# Patient Record
Sex: Female | Born: 1955 | Race: Black or African American | Hispanic: No | Marital: Single | Smoking: Never smoker
Health system: Southern US, Community
[De-identification: ages and names within clinical notes are randomized; demographics above are authoritative.]

## PROBLEM LIST (undated history)

## (undated) DIAGNOSIS — J069 Acute upper respiratory infection, unspecified: Secondary | ICD-10-CM

## (undated) DIAGNOSIS — R0600 Dyspnea, unspecified: Secondary | ICD-10-CM

## (undated) DIAGNOSIS — H9190 Unspecified hearing loss, unspecified ear: Secondary | ICD-10-CM

## (undated) DIAGNOSIS — R0789 Other chest pain: Secondary | ICD-10-CM

## (undated) DIAGNOSIS — E785 Hyperlipidemia, unspecified: Secondary | ICD-10-CM

## (undated) DIAGNOSIS — N731 Chronic parametritis and pelvic cellulitis: Secondary | ICD-10-CM

## (undated) DIAGNOSIS — Q210 Ventricular septal defect: Secondary | ICD-10-CM

## (undated) DIAGNOSIS — I10 Essential (primary) hypertension: Secondary | ICD-10-CM

## (undated) DIAGNOSIS — Z9289 Personal history of other medical treatment: Secondary | ICD-10-CM

## (undated) DIAGNOSIS — R011 Cardiac murmur, unspecified: Secondary | ICD-10-CM

## (undated) DIAGNOSIS — E669 Obesity, unspecified: Secondary | ICD-10-CM

## (undated) DIAGNOSIS — R4701 Aphasia: Secondary | ICD-10-CM

## (undated) DIAGNOSIS — R42 Dizziness and giddiness: Secondary | ICD-10-CM

## (undated) DIAGNOSIS — M549 Dorsalgia, unspecified: Secondary | ICD-10-CM

## (undated) DIAGNOSIS — K56609 Unspecified intestinal obstruction, unspecified as to partial versus complete obstruction: Secondary | ICD-10-CM

## (undated) HISTORY — DX: Ventricular septal defect: Q21.0

## (undated) HISTORY — DX: Chronic parametritis and pelvic cellulitis: N73.1

## (undated) HISTORY — DX: Essential (primary) hypertension: I10

## (undated) HISTORY — DX: Acute upper respiratory infection, unspecified: J06.9

## (undated) HISTORY — DX: Obesity, unspecified: E66.9

## (undated) HISTORY — DX: Hyperlipidemia, unspecified: E78.5

## (undated) HISTORY — DX: Dorsalgia, unspecified: M54.9

## (undated) HISTORY — PX: COLPOSCOPY: SHX161

## (undated) HISTORY — DX: Personal history of other medical treatment: Z92.89

## (undated) HISTORY — DX: Cardiac murmur, unspecified: R01.1

## (undated) HISTORY — DX: Dyspnea, unspecified: R06.00

## (undated) HISTORY — DX: Other chest pain: R07.89

## (undated) HISTORY — DX: Dizziness and giddiness: R42

## (undated) HISTORY — PX: CHOLECYSTECTOMY: SHX55

## (undated) HISTORY — PX: ABDOMINAL HYSTERECTOMY: SHX81

## (undated) HISTORY — PX: OTHER SURGICAL HISTORY: SHX169

## (undated) HISTORY — PX: ABDOMINAL HERNIA REPAIR: SHX539

---

## 1978-10-05 HISTORY — PX: TUBAL LIGATION: SHX77

## 1993-11-04 HISTORY — PX: CHOLECYSTECTOMY: SHX55

## 1995-08-23 HISTORY — PX: CARDIAC CATHETERIZATION: SHX172

## 1997-02-04 ENCOUNTER — Inpatient Hospital Stay (HOSPITAL_COMMUNITY): Admission: AD | Admit: 1997-02-04 | Discharge: 1997-02-12 | Payer: Self-pay | Admitting: Obstetrics

## 1997-06-25 ENCOUNTER — Inpatient Hospital Stay (HOSPITAL_COMMUNITY): Admission: AD | Admit: 1997-06-25 | Discharge: 1997-06-25 | Payer: Self-pay | Admitting: *Deleted

## 1997-08-02 ENCOUNTER — Emergency Department (HOSPITAL_COMMUNITY): Admission: EM | Admit: 1997-08-02 | Discharge: 1997-08-02 | Payer: Self-pay | Admitting: Emergency Medicine

## 1997-09-03 ENCOUNTER — Encounter: Admission: RE | Admit: 1997-09-03 | Discharge: 1997-09-03 | Payer: Self-pay | Admitting: Family Medicine

## 1997-09-18 ENCOUNTER — Inpatient Hospital Stay (HOSPITAL_COMMUNITY): Admission: AD | Admit: 1997-09-18 | Discharge: 1997-09-22 | Payer: Self-pay | Admitting: Obstetrics

## 1997-10-03 ENCOUNTER — Encounter: Admission: RE | Admit: 1997-10-03 | Discharge: 1997-10-03 | Payer: Self-pay | Admitting: Obstetrics

## 1997-10-22 ENCOUNTER — Ambulatory Visit (HOSPITAL_COMMUNITY): Admission: RE | Admit: 1997-10-22 | Discharge: 1997-10-22 | Payer: Self-pay | Admitting: Obstetrics

## 1997-11-05 ENCOUNTER — Encounter: Admission: RE | Admit: 1997-11-05 | Discharge: 1997-11-05 | Payer: Self-pay | Admitting: Family Medicine

## 1997-11-17 ENCOUNTER — Encounter: Payer: Self-pay | Admitting: Emergency Medicine

## 1997-11-17 ENCOUNTER — Emergency Department (HOSPITAL_COMMUNITY): Admission: EM | Admit: 1997-11-17 | Discharge: 1997-11-17 | Payer: Self-pay

## 1997-12-03 ENCOUNTER — Other Ambulatory Visit: Admission: RE | Admit: 1997-12-03 | Discharge: 1997-12-03 | Payer: Self-pay | Admitting: Family Medicine

## 1997-12-03 ENCOUNTER — Encounter: Admission: RE | Admit: 1997-12-03 | Discharge: 1997-12-03 | Payer: Self-pay | Admitting: Family Medicine

## 1997-12-10 ENCOUNTER — Encounter: Admission: RE | Admit: 1997-12-10 | Discharge: 1997-12-10 | Payer: Self-pay | Admitting: Family Medicine

## 1998-02-27 ENCOUNTER — Encounter: Admission: RE | Admit: 1998-02-27 | Discharge: 1998-02-27 | Payer: Self-pay | Admitting: Obstetrics

## 1998-02-27 ENCOUNTER — Inpatient Hospital Stay (HOSPITAL_COMMUNITY): Admission: AD | Admit: 1998-02-27 | Discharge: 1998-03-03 | Payer: Self-pay | Admitting: Obstetrics

## 1998-02-28 ENCOUNTER — Encounter: Payer: Self-pay | Admitting: Obstetrics

## 1998-03-20 ENCOUNTER — Encounter: Admission: RE | Admit: 1998-03-20 | Discharge: 1998-03-20 | Payer: Self-pay | Admitting: Obstetrics

## 1998-04-17 ENCOUNTER — Encounter: Admission: RE | Admit: 1998-04-17 | Discharge: 1998-04-17 | Payer: Self-pay | Admitting: Obstetrics

## 1998-05-02 ENCOUNTER — Emergency Department (HOSPITAL_COMMUNITY): Admission: EM | Admit: 1998-05-02 | Discharge: 1998-05-02 | Payer: Self-pay | Admitting: Emergency Medicine

## 1998-05-02 ENCOUNTER — Encounter: Payer: Self-pay | Admitting: Emergency Medicine

## 1998-05-05 ENCOUNTER — Encounter: Payer: Self-pay | Admitting: Obstetrics

## 1998-05-05 ENCOUNTER — Ambulatory Visit (HOSPITAL_COMMUNITY): Admission: RE | Admit: 1998-05-05 | Discharge: 1998-05-05 | Payer: Self-pay | Admitting: Obstetrics

## 1998-05-15 ENCOUNTER — Encounter: Admission: RE | Admit: 1998-05-15 | Discharge: 1998-05-15 | Payer: Self-pay | Admitting: Obstetrics

## 1998-07-03 ENCOUNTER — Inpatient Hospital Stay (HOSPITAL_COMMUNITY): Admission: AD | Admit: 1998-07-03 | Discharge: 1998-07-03 | Payer: Self-pay | Admitting: Obstetrics

## 1998-10-05 ENCOUNTER — Encounter (INDEPENDENT_AMBULATORY_CARE_PROVIDER_SITE_OTHER): Payer: Self-pay | Admitting: *Deleted

## 1998-10-14 ENCOUNTER — Other Ambulatory Visit: Admission: RE | Admit: 1998-10-14 | Discharge: 1998-10-14 | Payer: Self-pay | Admitting: Family Medicine

## 1998-11-21 ENCOUNTER — Encounter: Payer: Self-pay | Admitting: Surgery

## 1998-11-21 ENCOUNTER — Encounter: Admission: RE | Admit: 1998-11-21 | Discharge: 1998-11-21 | Payer: Self-pay | Admitting: Surgery

## 1998-12-05 ENCOUNTER — Emergency Department (HOSPITAL_COMMUNITY): Admission: EM | Admit: 1998-12-05 | Discharge: 1998-12-05 | Payer: Self-pay | Admitting: Emergency Medicine

## 1998-12-05 ENCOUNTER — Encounter: Payer: Self-pay | Admitting: Emergency Medicine

## 1998-12-12 ENCOUNTER — Emergency Department (HOSPITAL_COMMUNITY): Admission: EM | Admit: 1998-12-12 | Discharge: 1998-12-12 | Payer: Self-pay | Admitting: Emergency Medicine

## 1998-12-31 ENCOUNTER — Emergency Department (HOSPITAL_COMMUNITY): Admission: EM | Admit: 1998-12-31 | Discharge: 1998-12-31 | Payer: Self-pay | Admitting: Emergency Medicine

## 1998-12-31 ENCOUNTER — Encounter: Payer: Self-pay | Admitting: Emergency Medicine

## 1999-01-13 ENCOUNTER — Encounter: Admission: RE | Admit: 1999-01-13 | Discharge: 1999-01-13 | Payer: Self-pay | Admitting: Family Medicine

## 1999-02-10 ENCOUNTER — Encounter: Admission: RE | Admit: 1999-02-10 | Discharge: 1999-02-10 | Payer: Self-pay | Admitting: Family Medicine

## 1999-02-24 ENCOUNTER — Encounter: Admission: RE | Admit: 1999-02-24 | Discharge: 1999-02-24 | Payer: Self-pay | Admitting: Family Medicine

## 1999-03-03 ENCOUNTER — Encounter: Admission: RE | Admit: 1999-03-03 | Discharge: 1999-03-03 | Payer: Self-pay | Admitting: Family Medicine

## 1999-03-12 ENCOUNTER — Encounter: Payer: Self-pay | Admitting: Sports Medicine

## 1999-03-12 ENCOUNTER — Encounter: Admission: RE | Admit: 1999-03-12 | Discharge: 1999-03-12 | Payer: Self-pay | Admitting: Sports Medicine

## 1999-03-17 ENCOUNTER — Encounter: Admission: RE | Admit: 1999-03-17 | Discharge: 1999-03-17 | Payer: Self-pay | Admitting: Family Medicine

## 1999-06-04 ENCOUNTER — Encounter: Admission: RE | Admit: 1999-06-04 | Discharge: 1999-06-04 | Payer: Self-pay | Admitting: Obstetrics

## 1999-06-24 ENCOUNTER — Encounter: Payer: Self-pay | Admitting: Obstetrics

## 1999-06-24 ENCOUNTER — Ambulatory Visit (HOSPITAL_COMMUNITY): Admission: RE | Admit: 1999-06-24 | Discharge: 1999-06-24 | Payer: Self-pay

## 1999-10-01 ENCOUNTER — Encounter: Admission: RE | Admit: 1999-10-01 | Discharge: 1999-10-01 | Payer: Self-pay | Admitting: Obstetrics

## 1999-10-13 ENCOUNTER — Emergency Department (HOSPITAL_COMMUNITY): Admission: EM | Admit: 1999-10-13 | Discharge: 1999-10-13 | Payer: Self-pay | Admitting: Emergency Medicine

## 1999-10-19 ENCOUNTER — Ambulatory Visit (HOSPITAL_COMMUNITY): Admission: RE | Admit: 1999-10-19 | Discharge: 1999-10-19 | Payer: Self-pay | Admitting: Obstetrics

## 1999-12-01 ENCOUNTER — Emergency Department (HOSPITAL_COMMUNITY): Admission: EM | Admit: 1999-12-01 | Discharge: 1999-12-02 | Payer: Self-pay | Admitting: Emergency Medicine

## 1999-12-01 ENCOUNTER — Encounter: Payer: Self-pay | Admitting: Emergency Medicine

## 1999-12-03 ENCOUNTER — Encounter: Admission: RE | Admit: 1999-12-03 | Discharge: 1999-12-03 | Payer: Self-pay | Admitting: Obstetrics

## 1999-12-11 ENCOUNTER — Ambulatory Visit (HOSPITAL_COMMUNITY): Admission: RE | Admit: 1999-12-11 | Discharge: 1999-12-11 | Payer: Self-pay | Admitting: Obstetrics

## 2000-02-09 ENCOUNTER — Encounter: Admission: RE | Admit: 2000-02-09 | Discharge: 2000-02-09 | Payer: Self-pay | Admitting: Obstetrics & Gynecology

## 2000-02-11 ENCOUNTER — Encounter: Admission: RE | Admit: 2000-02-11 | Discharge: 2000-02-11 | Payer: Self-pay | Admitting: Obstetrics

## 2000-04-05 ENCOUNTER — Encounter: Payer: Self-pay | Admitting: Emergency Medicine

## 2000-04-05 ENCOUNTER — Emergency Department (HOSPITAL_COMMUNITY): Admission: EM | Admit: 2000-04-05 | Discharge: 2000-04-05 | Payer: Self-pay | Admitting: Emergency Medicine

## 2000-04-12 ENCOUNTER — Encounter: Admission: RE | Admit: 2000-04-12 | Discharge: 2000-04-12 | Payer: Self-pay | Admitting: Internal Medicine

## 2000-04-19 ENCOUNTER — Ambulatory Visit (HOSPITAL_COMMUNITY): Admission: RE | Admit: 2000-04-19 | Discharge: 2000-04-19 | Payer: Self-pay | Admitting: Hematology and Oncology

## 2000-04-19 ENCOUNTER — Encounter: Admission: RE | Admit: 2000-04-19 | Discharge: 2000-04-19 | Payer: Self-pay | Admitting: Hematology and Oncology

## 2000-04-20 ENCOUNTER — Encounter: Payer: Self-pay | Admitting: Internal Medicine

## 2000-04-20 ENCOUNTER — Encounter: Admission: RE | Admit: 2000-04-20 | Discharge: 2000-04-20 | Payer: Self-pay | Admitting: Internal Medicine

## 2000-04-20 ENCOUNTER — Ambulatory Visit (HOSPITAL_COMMUNITY): Admission: RE | Admit: 2000-04-20 | Discharge: 2000-04-20 | Payer: Self-pay

## 2000-06-16 ENCOUNTER — Encounter: Admission: RE | Admit: 2000-06-16 | Discharge: 2000-06-16 | Payer: Self-pay | Admitting: Internal Medicine

## 2000-07-12 ENCOUNTER — Other Ambulatory Visit: Admission: RE | Admit: 2000-07-12 | Discharge: 2000-07-12 | Payer: Self-pay | Admitting: Internal Medicine

## 2000-07-12 ENCOUNTER — Encounter: Admission: RE | Admit: 2000-07-12 | Discharge: 2000-07-12 | Payer: Self-pay | Admitting: Internal Medicine

## 2000-08-12 ENCOUNTER — Encounter: Admission: RE | Admit: 2000-08-12 | Discharge: 2000-08-12 | Payer: Self-pay | Admitting: Internal Medicine

## 2000-09-26 ENCOUNTER — Encounter: Payer: Self-pay | Admitting: Obstetrics

## 2000-09-26 ENCOUNTER — Inpatient Hospital Stay (HOSPITAL_COMMUNITY): Admission: AD | Admit: 2000-09-26 | Discharge: 2000-10-09 | Payer: Self-pay | Admitting: Obstetrics

## 2000-09-27 ENCOUNTER — Encounter: Payer: Self-pay | Admitting: Obstetrics

## 2000-10-03 ENCOUNTER — Encounter: Payer: Self-pay | Admitting: Obstetrics

## 2000-10-12 ENCOUNTER — Inpatient Hospital Stay (HOSPITAL_COMMUNITY): Admission: AD | Admit: 2000-10-12 | Discharge: 2000-10-16 | Payer: Self-pay | Admitting: Obstetrics

## 2000-10-16 ENCOUNTER — Encounter: Payer: Self-pay | Admitting: Family Medicine

## 2000-10-16 ENCOUNTER — Encounter: Payer: Self-pay | Admitting: Obstetrics

## 2000-11-14 ENCOUNTER — Encounter: Admission: RE | Admit: 2000-11-14 | Discharge: 2000-11-14 | Payer: Self-pay

## 2001-01-16 ENCOUNTER — Encounter: Payer: Self-pay | Admitting: Emergency Medicine

## 2001-01-16 ENCOUNTER — Emergency Department (HOSPITAL_COMMUNITY): Admission: EM | Admit: 2001-01-16 | Discharge: 2001-01-16 | Payer: Self-pay | Admitting: Emergency Medicine

## 2001-01-20 ENCOUNTER — Encounter: Payer: Self-pay | Admitting: Emergency Medicine

## 2001-01-20 ENCOUNTER — Emergency Department (HOSPITAL_COMMUNITY): Admission: EM | Admit: 2001-01-20 | Discharge: 2001-01-20 | Payer: Self-pay | Admitting: Emergency Medicine

## 2001-01-23 ENCOUNTER — Emergency Department (HOSPITAL_COMMUNITY): Admission: EM | Admit: 2001-01-23 | Discharge: 2001-01-23 | Payer: Self-pay | Admitting: Emergency Medicine

## 2001-06-29 ENCOUNTER — Ambulatory Visit (HOSPITAL_COMMUNITY): Admission: RE | Admit: 2001-06-29 | Discharge: 2001-06-29 | Payer: Self-pay | Admitting: *Deleted

## 2001-06-29 ENCOUNTER — Encounter: Admission: RE | Admit: 2001-06-29 | Discharge: 2001-06-29 | Payer: Self-pay | Admitting: Internal Medicine

## 2001-06-29 ENCOUNTER — Encounter: Payer: Self-pay | Admitting: *Deleted

## 2001-07-06 ENCOUNTER — Encounter: Admission: RE | Admit: 2001-07-06 | Discharge: 2001-07-06 | Payer: Self-pay | Admitting: Internal Medicine

## 2001-07-14 ENCOUNTER — Encounter: Admission: RE | Admit: 2001-07-14 | Discharge: 2001-07-14 | Payer: Self-pay | Admitting: Internal Medicine

## 2001-08-16 ENCOUNTER — Encounter: Admission: RE | Admit: 2001-08-16 | Discharge: 2001-08-16 | Payer: Self-pay | Admitting: Internal Medicine

## 2001-08-30 ENCOUNTER — Encounter: Payer: Self-pay | Admitting: Emergency Medicine

## 2001-08-30 ENCOUNTER — Emergency Department (HOSPITAL_COMMUNITY): Admission: EM | Admit: 2001-08-30 | Discharge: 2001-08-30 | Payer: Self-pay | Admitting: Emergency Medicine

## 2001-09-12 ENCOUNTER — Encounter: Admission: RE | Admit: 2001-09-12 | Discharge: 2001-09-12 | Payer: Self-pay | Admitting: *Deleted

## 2001-09-12 ENCOUNTER — Other Ambulatory Visit: Admission: RE | Admit: 2001-09-12 | Discharge: 2001-09-12 | Payer: Self-pay | Admitting: *Deleted

## 2001-09-12 ENCOUNTER — Encounter (INDEPENDENT_AMBULATORY_CARE_PROVIDER_SITE_OTHER): Payer: Self-pay | Admitting: *Deleted

## 2001-09-28 ENCOUNTER — Emergency Department (HOSPITAL_COMMUNITY): Admission: EM | Admit: 2001-09-28 | Discharge: 2001-09-28 | Payer: Self-pay | Admitting: Emergency Medicine

## 2001-09-28 ENCOUNTER — Encounter: Payer: Self-pay | Admitting: Emergency Medicine

## 2001-10-17 ENCOUNTER — Encounter: Admission: RE | Admit: 2001-10-17 | Discharge: 2001-10-17 | Payer: Self-pay | Admitting: *Deleted

## 2001-10-17 ENCOUNTER — Encounter (INDEPENDENT_AMBULATORY_CARE_PROVIDER_SITE_OTHER): Payer: Self-pay | Admitting: Specialist

## 2001-10-17 ENCOUNTER — Other Ambulatory Visit: Admission: RE | Admit: 2001-10-17 | Discharge: 2001-10-17 | Payer: Self-pay | Admitting: *Deleted

## 2001-10-20 ENCOUNTER — Ambulatory Visit (HOSPITAL_COMMUNITY): Admission: RE | Admit: 2001-10-20 | Discharge: 2001-10-20 | Payer: Self-pay | Admitting: Obstetrics

## 2001-10-31 ENCOUNTER — Encounter: Admission: RE | Admit: 2001-10-31 | Discharge: 2001-10-31 | Payer: Self-pay | Admitting: *Deleted

## 2001-11-02 ENCOUNTER — Inpatient Hospital Stay (HOSPITAL_COMMUNITY): Admission: EM | Admit: 2001-11-02 | Discharge: 2001-11-03 | Payer: Self-pay | Admitting: Emergency Medicine

## 2001-11-02 ENCOUNTER — Encounter: Payer: Self-pay | Admitting: Emergency Medicine

## 2001-11-03 ENCOUNTER — Encounter: Payer: Self-pay | Admitting: Cardiovascular Disease

## 2001-11-03 ENCOUNTER — Encounter: Payer: Self-pay | Admitting: Internal Medicine

## 2001-11-21 ENCOUNTER — Ambulatory Visit (HOSPITAL_COMMUNITY): Admission: RE | Admit: 2001-11-21 | Discharge: 2001-11-21 | Payer: Self-pay | Admitting: Obstetrics

## 2001-11-21 ENCOUNTER — Encounter: Payer: Self-pay | Admitting: Obstetrics

## 2001-12-19 ENCOUNTER — Other Ambulatory Visit: Admission: RE | Admit: 2001-12-19 | Discharge: 2001-12-19 | Payer: Self-pay | Admitting: *Deleted

## 2001-12-19 ENCOUNTER — Encounter: Admission: RE | Admit: 2001-12-19 | Discharge: 2001-12-19 | Payer: Self-pay | Admitting: *Deleted

## 2001-12-19 ENCOUNTER — Encounter (INDEPENDENT_AMBULATORY_CARE_PROVIDER_SITE_OTHER): Payer: Self-pay

## 2002-01-24 ENCOUNTER — Encounter: Admission: RE | Admit: 2002-01-24 | Discharge: 2002-01-24 | Payer: Self-pay | Admitting: Internal Medicine

## 2002-02-14 ENCOUNTER — Ambulatory Visit (HOSPITAL_COMMUNITY): Admission: RE | Admit: 2002-02-14 | Discharge: 2002-02-14 | Payer: Self-pay | Admitting: *Deleted

## 2002-03-26 ENCOUNTER — Encounter: Admission: RE | Admit: 2002-03-26 | Discharge: 2002-03-26 | Payer: Self-pay | Admitting: Internal Medicine

## 2002-04-05 DIAGNOSIS — E119 Type 2 diabetes mellitus without complications: Secondary | ICD-10-CM | POA: Insufficient documentation

## 2002-04-09 ENCOUNTER — Encounter: Payer: Self-pay | Admitting: Internal Medicine

## 2002-04-09 ENCOUNTER — Ambulatory Visit (HOSPITAL_COMMUNITY): Admission: RE | Admit: 2002-04-09 | Discharge: 2002-04-09 | Payer: Self-pay | Admitting: Internal Medicine

## 2002-04-25 ENCOUNTER — Encounter: Admission: RE | Admit: 2002-04-25 | Discharge: 2002-04-25 | Payer: Self-pay | Admitting: Internal Medicine

## 2002-04-29 ENCOUNTER — Emergency Department (HOSPITAL_COMMUNITY): Admission: EM | Admit: 2002-04-29 | Discharge: 2002-04-29 | Payer: Self-pay | Admitting: *Deleted

## 2002-04-30 ENCOUNTER — Ambulatory Visit (HOSPITAL_COMMUNITY): Admission: RE | Admit: 2002-04-30 | Discharge: 2002-04-30 | Payer: Self-pay | Admitting: Internal Medicine

## 2002-04-30 ENCOUNTER — Encounter: Payer: Self-pay | Admitting: Internal Medicine

## 2002-06-06 ENCOUNTER — Encounter (INDEPENDENT_AMBULATORY_CARE_PROVIDER_SITE_OTHER): Payer: Self-pay | Admitting: *Deleted

## 2002-06-18 ENCOUNTER — Encounter: Admission: RE | Admit: 2002-06-18 | Discharge: 2002-06-18 | Payer: Self-pay | Admitting: Internal Medicine

## 2002-06-22 ENCOUNTER — Emergency Department (HOSPITAL_COMMUNITY): Admission: EM | Admit: 2002-06-22 | Discharge: 2002-06-22 | Payer: Self-pay | Admitting: *Deleted

## 2002-06-22 ENCOUNTER — Encounter: Payer: Self-pay | Admitting: Emergency Medicine

## 2002-07-10 ENCOUNTER — Emergency Department (HOSPITAL_COMMUNITY): Admission: EM | Admit: 2002-07-10 | Discharge: 2002-07-10 | Payer: Self-pay

## 2002-07-10 ENCOUNTER — Encounter (INDEPENDENT_AMBULATORY_CARE_PROVIDER_SITE_OTHER): Payer: Self-pay | Admitting: *Deleted

## 2002-07-10 ENCOUNTER — Encounter: Admission: RE | Admit: 2002-07-10 | Discharge: 2002-07-10 | Payer: Self-pay | Admitting: Obstetrics and Gynecology

## 2002-07-10 ENCOUNTER — Encounter (INDEPENDENT_AMBULATORY_CARE_PROVIDER_SITE_OTHER): Payer: Self-pay | Admitting: Specialist

## 2002-07-13 ENCOUNTER — Encounter (HOSPITAL_COMMUNITY): Admission: RE | Admit: 2002-07-13 | Discharge: 2002-10-11 | Payer: Self-pay | Admitting: Physician Assistant

## 2002-10-22 ENCOUNTER — Emergency Department (HOSPITAL_COMMUNITY): Admission: AD | Admit: 2002-10-22 | Discharge: 2002-10-22 | Payer: Self-pay | Admitting: Family Medicine

## 2002-12-04 ENCOUNTER — Ambulatory Visit (HOSPITAL_COMMUNITY): Admission: RE | Admit: 2002-12-04 | Discharge: 2002-12-04 | Payer: Self-pay | Admitting: Obstetrics

## 2002-12-31 ENCOUNTER — Emergency Department (HOSPITAL_COMMUNITY): Admission: AD | Admit: 2002-12-31 | Discharge: 2002-12-31 | Payer: Self-pay | Admitting: Family Medicine

## 2003-02-02 ENCOUNTER — Emergency Department (HOSPITAL_COMMUNITY): Admission: AD | Admit: 2003-02-02 | Discharge: 2003-02-02 | Payer: Self-pay | Admitting: Family Medicine

## 2003-02-04 ENCOUNTER — Encounter: Admission: RE | Admit: 2003-02-04 | Discharge: 2003-02-04 | Payer: Self-pay | Admitting: Internal Medicine

## 2003-02-06 ENCOUNTER — Encounter: Admission: RE | Admit: 2003-02-06 | Discharge: 2003-02-06 | Payer: Self-pay | Admitting: Internal Medicine

## 2003-02-13 ENCOUNTER — Encounter: Admission: RE | Admit: 2003-02-13 | Discharge: 2003-02-13 | Payer: Self-pay | Admitting: Internal Medicine

## 2003-03-19 ENCOUNTER — Encounter: Admission: RE | Admit: 2003-03-19 | Discharge: 2003-03-19 | Payer: Self-pay | Admitting: Internal Medicine

## 2003-04-03 ENCOUNTER — Ambulatory Visit (HOSPITAL_COMMUNITY): Admission: RE | Admit: 2003-04-03 | Discharge: 2003-04-03 | Payer: Self-pay | Admitting: Internal Medicine

## 2003-04-09 ENCOUNTER — Emergency Department (HOSPITAL_COMMUNITY): Admission: EM | Admit: 2003-04-09 | Discharge: 2003-04-09 | Payer: Self-pay | Admitting: Family Medicine

## 2003-04-18 ENCOUNTER — Encounter: Admission: RE | Admit: 2003-04-18 | Discharge: 2003-04-18 | Payer: Self-pay | Admitting: Internal Medicine

## 2003-05-01 ENCOUNTER — Ambulatory Visit (HOSPITAL_COMMUNITY): Admission: RE | Admit: 2003-05-01 | Discharge: 2003-05-01 | Payer: Self-pay | Admitting: Internal Medicine

## 2003-05-15 ENCOUNTER — Encounter: Admission: RE | Admit: 2003-05-15 | Discharge: 2003-05-15 | Payer: Self-pay | Admitting: Internal Medicine

## 2003-05-16 ENCOUNTER — Ambulatory Visit (HOSPITAL_COMMUNITY): Admission: RE | Admit: 2003-05-16 | Discharge: 2003-05-16 | Payer: Self-pay | Admitting: Pulmonary Disease

## 2003-07-04 ENCOUNTER — Emergency Department (HOSPITAL_COMMUNITY): Admission: EM | Admit: 2003-07-04 | Discharge: 2003-07-04 | Payer: Self-pay | Admitting: Emergency Medicine

## 2003-08-19 ENCOUNTER — Inpatient Hospital Stay (HOSPITAL_COMMUNITY): Admission: AD | Admit: 2003-08-19 | Discharge: 2003-08-20 | Payer: Self-pay | Admitting: Obstetrics

## 2003-08-23 ENCOUNTER — Inpatient Hospital Stay (HOSPITAL_COMMUNITY): Admission: AD | Admit: 2003-08-23 | Discharge: 2003-08-23 | Payer: Self-pay | Admitting: *Deleted

## 2003-10-10 ENCOUNTER — Emergency Department (HOSPITAL_COMMUNITY): Admission: EM | Admit: 2003-10-10 | Discharge: 2003-10-10 | Payer: Self-pay | Admitting: Family Medicine

## 2003-10-27 ENCOUNTER — Ambulatory Visit: Payer: Self-pay | Admitting: Internal Medicine

## 2003-10-27 ENCOUNTER — Observation Stay (HOSPITAL_COMMUNITY): Admission: EM | Admit: 2003-10-27 | Discharge: 2003-10-28 | Payer: Self-pay | Admitting: Emergency Medicine

## 2003-10-28 ENCOUNTER — Encounter: Payer: Self-pay | Admitting: Cardiology

## 2003-11-11 ENCOUNTER — Ambulatory Visit: Payer: Self-pay | Admitting: Internal Medicine

## 2003-12-01 ENCOUNTER — Emergency Department (HOSPITAL_COMMUNITY): Admission: EM | Admit: 2003-12-01 | Discharge: 2003-12-01 | Payer: Self-pay | Admitting: Family Medicine

## 2003-12-23 ENCOUNTER — Ambulatory Visit (HOSPITAL_COMMUNITY): Admission: RE | Admit: 2003-12-23 | Discharge: 2003-12-23 | Payer: Self-pay | Admitting: Obstetrics

## 2003-12-24 ENCOUNTER — Inpatient Hospital Stay (HOSPITAL_COMMUNITY): Admission: AD | Admit: 2003-12-24 | Discharge: 2003-12-24 | Payer: Self-pay | Admitting: *Deleted

## 2004-02-13 ENCOUNTER — Emergency Department (HOSPITAL_COMMUNITY): Admission: EM | Admit: 2004-02-13 | Discharge: 2004-02-13 | Payer: Self-pay | Admitting: Family Medicine

## 2004-03-11 ENCOUNTER — Emergency Department (HOSPITAL_COMMUNITY): Admission: EM | Admit: 2004-03-11 | Discharge: 2004-03-11 | Payer: Self-pay | Admitting: Emergency Medicine

## 2004-03-25 ENCOUNTER — Emergency Department (HOSPITAL_COMMUNITY): Admission: EM | Admit: 2004-03-25 | Discharge: 2004-03-25 | Payer: Self-pay | Admitting: Family Medicine

## 2004-03-27 ENCOUNTER — Ambulatory Visit: Payer: Self-pay | Admitting: Internal Medicine

## 2004-04-01 ENCOUNTER — Ambulatory Visit: Payer: Self-pay | Admitting: Internal Medicine

## 2004-04-07 ENCOUNTER — Encounter (INDEPENDENT_AMBULATORY_CARE_PROVIDER_SITE_OTHER): Payer: Self-pay | Admitting: *Deleted

## 2004-04-07 ENCOUNTER — Encounter: Admission: RE | Admit: 2004-04-07 | Discharge: 2004-04-07 | Payer: Self-pay | Admitting: Internal Medicine

## 2004-04-17 ENCOUNTER — Encounter: Admission: RE | Admit: 2004-04-17 | Discharge: 2004-04-17 | Payer: Self-pay | Admitting: Internal Medicine

## 2004-07-16 ENCOUNTER — Emergency Department (HOSPITAL_COMMUNITY): Admission: EM | Admit: 2004-07-16 | Discharge: 2004-07-17 | Payer: Self-pay | Admitting: Emergency Medicine

## 2004-08-13 ENCOUNTER — Emergency Department (HOSPITAL_COMMUNITY): Admission: EM | Admit: 2004-08-13 | Discharge: 2004-08-13 | Payer: Self-pay | Admitting: Family Medicine

## 2004-09-09 ENCOUNTER — Ambulatory Visit: Payer: Self-pay | Admitting: Internal Medicine

## 2004-09-17 ENCOUNTER — Ambulatory Visit: Payer: Self-pay | Admitting: Internal Medicine

## 2004-10-14 ENCOUNTER — Ambulatory Visit: Payer: Self-pay | Admitting: Hospitalist

## 2004-11-11 ENCOUNTER — Emergency Department (HOSPITAL_COMMUNITY): Admission: EM | Admit: 2004-11-11 | Discharge: 2004-11-11 | Payer: Self-pay | Admitting: Emergency Medicine

## 2004-11-15 ENCOUNTER — Emergency Department (HOSPITAL_COMMUNITY): Admission: EM | Admit: 2004-11-15 | Discharge: 2004-11-16 | Payer: Self-pay | Admitting: Podiatry

## 2004-11-16 ENCOUNTER — Ambulatory Visit: Payer: Self-pay | Admitting: Internal Medicine

## 2004-11-21 ENCOUNTER — Emergency Department (HOSPITAL_COMMUNITY): Admission: EM | Admit: 2004-11-21 | Discharge: 2004-11-21 | Payer: Self-pay | Admitting: Emergency Medicine

## 2004-11-30 ENCOUNTER — Ambulatory Visit (HOSPITAL_COMMUNITY): Admission: RE | Admit: 2004-11-30 | Discharge: 2004-11-30 | Payer: Self-pay | Admitting: Internal Medicine

## 2004-11-30 ENCOUNTER — Ambulatory Visit: Payer: Self-pay | Admitting: Internal Medicine

## 2004-12-19 ENCOUNTER — Emergency Department (HOSPITAL_COMMUNITY): Admission: EM | Admit: 2004-12-19 | Discharge: 2004-12-19 | Payer: Self-pay | Admitting: Family Medicine

## 2005-01-05 ENCOUNTER — Emergency Department (HOSPITAL_COMMUNITY): Admission: EM | Admit: 2005-01-05 | Discharge: 2005-01-05 | Payer: Self-pay | Admitting: Family Medicine

## 2005-01-18 ENCOUNTER — Inpatient Hospital Stay (HOSPITAL_COMMUNITY): Admission: AD | Admit: 2005-01-18 | Discharge: 2005-01-18 | Payer: Self-pay | Admitting: Obstetrics & Gynecology

## 2005-03-09 ENCOUNTER — Ambulatory Visit (HOSPITAL_COMMUNITY): Admission: RE | Admit: 2005-03-09 | Discharge: 2005-03-10 | Payer: Self-pay | Admitting: Surgery

## 2005-03-09 ENCOUNTER — Encounter (INDEPENDENT_AMBULATORY_CARE_PROVIDER_SITE_OTHER): Payer: Self-pay | Admitting: Specialist

## 2005-05-15 ENCOUNTER — Emergency Department (HOSPITAL_COMMUNITY): Admission: EM | Admit: 2005-05-15 | Discharge: 2005-05-15 | Payer: Self-pay | Admitting: Family Medicine

## 2005-05-16 ENCOUNTER — Emergency Department (HOSPITAL_COMMUNITY): Admission: EM | Admit: 2005-05-16 | Discharge: 2005-05-16 | Payer: Self-pay | Admitting: Emergency Medicine

## 2005-06-19 ENCOUNTER — Emergency Department (HOSPITAL_COMMUNITY): Admission: EM | Admit: 2005-06-19 | Discharge: 2005-06-19 | Payer: Self-pay | Admitting: Emergency Medicine

## 2005-07-02 ENCOUNTER — Emergency Department (HOSPITAL_COMMUNITY): Admission: EM | Admit: 2005-07-02 | Discharge: 2005-07-02 | Payer: Self-pay | Admitting: Emergency Medicine

## 2005-07-15 ENCOUNTER — Ambulatory Visit (HOSPITAL_COMMUNITY): Admission: RE | Admit: 2005-07-15 | Discharge: 2005-07-15 | Payer: Self-pay | Admitting: Obstetrics

## 2005-08-11 ENCOUNTER — Inpatient Hospital Stay (HOSPITAL_COMMUNITY): Admission: AD | Admit: 2005-08-11 | Discharge: 2005-08-11 | Payer: Self-pay | Admitting: Obstetrics and Gynecology

## 2005-09-03 ENCOUNTER — Emergency Department (HOSPITAL_COMMUNITY): Admission: EM | Admit: 2005-09-03 | Discharge: 2005-09-04 | Payer: Self-pay | Admitting: Emergency Medicine

## 2005-10-26 ENCOUNTER — Emergency Department (HOSPITAL_COMMUNITY): Admission: EM | Admit: 2005-10-26 | Discharge: 2005-10-26 | Payer: Self-pay | Admitting: Family Medicine

## 2005-11-10 ENCOUNTER — Encounter (INDEPENDENT_AMBULATORY_CARE_PROVIDER_SITE_OTHER): Payer: Self-pay | Admitting: *Deleted

## 2005-11-10 DIAGNOSIS — J45909 Unspecified asthma, uncomplicated: Secondary | ICD-10-CM | POA: Insufficient documentation

## 2005-11-10 DIAGNOSIS — I10 Essential (primary) hypertension: Secondary | ICD-10-CM | POA: Insufficient documentation

## 2005-11-10 DIAGNOSIS — K219 Gastro-esophageal reflux disease without esophagitis: Secondary | ICD-10-CM

## 2005-11-10 DIAGNOSIS — R599 Enlarged lymph nodes, unspecified: Secondary | ICD-10-CM | POA: Insufficient documentation

## 2005-11-10 DIAGNOSIS — R011 Cardiac murmur, unspecified: Secondary | ICD-10-CM

## 2005-11-10 DIAGNOSIS — S142XXA Injury of nerve root of cervical spine, initial encounter: Secondary | ICD-10-CM

## 2005-11-10 DIAGNOSIS — H9190 Unspecified hearing loss, unspecified ear: Secondary | ICD-10-CM | POA: Insufficient documentation

## 2005-11-12 ENCOUNTER — Encounter (INDEPENDENT_AMBULATORY_CARE_PROVIDER_SITE_OTHER): Payer: Self-pay | Admitting: *Deleted

## 2006-03-04 ENCOUNTER — Encounter (INDEPENDENT_AMBULATORY_CARE_PROVIDER_SITE_OTHER): Payer: Self-pay | Admitting: *Deleted

## 2006-03-08 ENCOUNTER — Telehealth (INDEPENDENT_AMBULATORY_CARE_PROVIDER_SITE_OTHER): Payer: Self-pay | Admitting: *Deleted

## 2006-03-08 ENCOUNTER — Ambulatory Visit: Payer: Self-pay | Admitting: *Deleted

## 2006-03-08 DIAGNOSIS — M549 Dorsalgia, unspecified: Secondary | ICD-10-CM | POA: Insufficient documentation

## 2006-03-08 LAB — CONVERTED CEMR LAB
Blood Glucose, Fingerstick: 185
CO2: 28 meq/L (ref 19–32)
Calcium: 9.3 mg/dL (ref 8.4–10.5)
Creatinine, Ser: 0.78 mg/dL (ref 0.40–1.20)
Glucose, Bld: 146 mg/dL — ABNORMAL HIGH (ref 70–99)
Hgb A1c MFr Bld: 7.3 %
Microalb Creat Ratio: 73.4 mg/g — ABNORMAL HIGH (ref 0.0–30.0)
Microalb, Ur: 18.4 mg/dL — ABNORMAL HIGH (ref 0.00–1.89)

## 2006-03-11 ENCOUNTER — Encounter (INDEPENDENT_AMBULATORY_CARE_PROVIDER_SITE_OTHER): Payer: Self-pay | Admitting: *Deleted

## 2006-03-11 ENCOUNTER — Ambulatory Visit: Payer: Self-pay | Admitting: Internal Medicine

## 2006-03-14 ENCOUNTER — Telehealth (INDEPENDENT_AMBULATORY_CARE_PROVIDER_SITE_OTHER): Payer: Self-pay | Admitting: *Deleted

## 2006-03-15 ENCOUNTER — Ambulatory Visit: Payer: Self-pay | Admitting: Internal Medicine

## 2006-04-12 ENCOUNTER — Emergency Department (HOSPITAL_COMMUNITY): Admission: EM | Admit: 2006-04-12 | Discharge: 2006-04-12 | Payer: Self-pay | Admitting: Family Medicine

## 2006-04-21 ENCOUNTER — Ambulatory Visit (HOSPITAL_COMMUNITY): Admission: RE | Admit: 2006-04-21 | Discharge: 2006-04-21 | Payer: Self-pay | Admitting: Family Medicine

## 2006-04-27 ENCOUNTER — Telehealth (INDEPENDENT_AMBULATORY_CARE_PROVIDER_SITE_OTHER): Payer: Self-pay | Admitting: *Deleted

## 2006-04-28 ENCOUNTER — Encounter: Admission: RE | Admit: 2006-04-28 | Discharge: 2006-04-28 | Payer: Self-pay | Admitting: Sports Medicine

## 2006-04-28 ENCOUNTER — Emergency Department (HOSPITAL_COMMUNITY): Admission: EM | Admit: 2006-04-28 | Discharge: 2006-04-28 | Payer: Self-pay | Admitting: Family Medicine

## 2006-05-09 ENCOUNTER — Emergency Department (HOSPITAL_COMMUNITY): Admission: EM | Admit: 2006-05-09 | Discharge: 2006-05-09 | Payer: Self-pay | Admitting: Family Medicine

## 2006-05-24 ENCOUNTER — Ambulatory Visit: Payer: Self-pay | Admitting: *Deleted

## 2006-06-07 ENCOUNTER — Ambulatory Visit: Payer: Self-pay | Admitting: *Deleted

## 2006-06-07 LAB — CONVERTED CEMR LAB
BUN: 9 mg/dL (ref 6–23)
Creatinine, Ser: 0.88 mg/dL (ref 0.40–1.20)
Helicobacter Pylori Antibody-IgG: 0.5
Potassium: 3.5 meq/L (ref 3.5–5.3)

## 2006-06-14 ENCOUNTER — Emergency Department (HOSPITAL_COMMUNITY): Admission: EM | Admit: 2006-06-14 | Discharge: 2006-06-14 | Payer: Self-pay | Admitting: Emergency Medicine

## 2006-06-21 ENCOUNTER — Ambulatory Visit: Payer: Self-pay | Admitting: Sports Medicine

## 2006-06-21 ENCOUNTER — Encounter (INDEPENDENT_AMBULATORY_CARE_PROVIDER_SITE_OTHER): Payer: Self-pay | Admitting: Family Medicine

## 2006-06-21 ENCOUNTER — Ambulatory Visit (HOSPITAL_COMMUNITY): Admission: RE | Admit: 2006-06-21 | Discharge: 2006-06-21 | Payer: Self-pay | Admitting: Family Medicine

## 2006-06-21 ENCOUNTER — Telehealth (INDEPENDENT_AMBULATORY_CARE_PROVIDER_SITE_OTHER): Payer: Self-pay | Admitting: *Deleted

## 2006-06-21 LAB — CONVERTED CEMR LAB
ALT: 16 units/L (ref 0–35)
CO2: 24 meq/L (ref 19–32)
Calcium: 9.3 mg/dL (ref 8.4–10.5)
Chloride: 106 meq/L (ref 96–112)
Cholesterol: 174 mg/dL (ref 0–200)
Creatinine, Ser: 0.67 mg/dL (ref 0.40–1.20)
Hemoglobin: 12.3 g/dL
Platelets: 298 10*3/uL
Total CHOL/HDL Ratio: 4.4
WBC: 8.8 10*3/uL

## 2006-06-22 ENCOUNTER — Ambulatory Visit (HOSPITAL_COMMUNITY): Admission: RE | Admit: 2006-06-22 | Discharge: 2006-06-22 | Payer: Self-pay | Admitting: Internal Medicine

## 2006-06-22 ENCOUNTER — Ambulatory Visit: Payer: Self-pay | Admitting: Internal Medicine

## 2006-06-22 LAB — CONVERTED CEMR LAB: Hgb A1c MFr Bld: 7 %

## 2006-06-23 ENCOUNTER — Encounter (INDEPENDENT_AMBULATORY_CARE_PROVIDER_SITE_OTHER): Payer: Self-pay | Admitting: Infectious Diseases

## 2006-06-23 LAB — CONVERTED CEMR LAB
AST: 15 units/L (ref 0–37)
Alkaline Phosphatase: 86 units/L (ref 39–117)
BUN: 16 mg/dL (ref 6–23)
Creatinine, Ser: 0.79 mg/dL (ref 0.40–1.20)

## 2006-06-27 ENCOUNTER — Encounter (INDEPENDENT_AMBULATORY_CARE_PROVIDER_SITE_OTHER): Payer: Self-pay | Admitting: Family Medicine

## 2006-07-22 ENCOUNTER — Telehealth: Payer: Self-pay | Admitting: *Deleted

## 2006-08-01 ENCOUNTER — Telehealth: Payer: Self-pay | Admitting: *Deleted

## 2006-08-22 ENCOUNTER — Ambulatory Visit: Payer: Self-pay | Admitting: *Deleted

## 2006-08-22 DIAGNOSIS — R109 Unspecified abdominal pain: Secondary | ICD-10-CM | POA: Insufficient documentation

## 2006-08-22 LAB — CONVERTED CEMR LAB
HCT: 39.4 % (ref 36.0–46.0)
MCV: 87.4 fL (ref 78.0–100.0)
RBC: 4.51 M/uL (ref 3.87–5.11)
WBC: 7.7 10*3/uL (ref 4.0–10.5)

## 2006-08-26 ENCOUNTER — Ambulatory Visit (HOSPITAL_COMMUNITY): Admission: RE | Admit: 2006-08-26 | Discharge: 2006-08-26 | Payer: Self-pay | Admitting: *Deleted

## 2006-08-30 ENCOUNTER — Encounter (INDEPENDENT_AMBULATORY_CARE_PROVIDER_SITE_OTHER): Payer: Self-pay | Admitting: *Deleted

## 2006-09-06 ENCOUNTER — Encounter: Admission: RE | Admit: 2006-09-06 | Discharge: 2006-09-06 | Payer: Self-pay | Admitting: Gastroenterology

## 2006-09-10 ENCOUNTER — Emergency Department (HOSPITAL_COMMUNITY): Admission: EM | Admit: 2006-09-10 | Discharge: 2006-09-10 | Payer: Self-pay | Admitting: Emergency Medicine

## 2006-09-21 ENCOUNTER — Encounter (INDEPENDENT_AMBULATORY_CARE_PROVIDER_SITE_OTHER): Payer: Self-pay | Admitting: *Deleted

## 2006-09-21 ENCOUNTER — Ambulatory Visit: Payer: Self-pay | Admitting: Internal Medicine

## 2006-10-19 ENCOUNTER — Emergency Department (HOSPITAL_COMMUNITY): Admission: EM | Admit: 2006-10-19 | Discharge: 2006-10-19 | Payer: Self-pay | Admitting: Family Medicine

## 2006-10-21 ENCOUNTER — Encounter (INDEPENDENT_AMBULATORY_CARE_PROVIDER_SITE_OTHER): Payer: Self-pay | Admitting: *Deleted

## 2006-10-28 ENCOUNTER — Encounter (INDEPENDENT_AMBULATORY_CARE_PROVIDER_SITE_OTHER): Payer: Self-pay | Admitting: *Deleted

## 2006-11-18 ENCOUNTER — Ambulatory Visit: Payer: Self-pay | Admitting: *Deleted

## 2006-11-18 ENCOUNTER — Encounter (INDEPENDENT_AMBULATORY_CARE_PROVIDER_SITE_OTHER): Payer: Self-pay | Admitting: *Deleted

## 2006-11-18 ENCOUNTER — Ambulatory Visit (HOSPITAL_COMMUNITY): Admission: RE | Admit: 2006-11-18 | Discharge: 2006-11-18 | Payer: Self-pay | Admitting: *Deleted

## 2006-11-18 LAB — CONVERTED CEMR LAB
BUN: 8 mg/dL (ref 6–23)
Blood in Urine, dipstick: NEGATIVE
CO2: 25 meq/L (ref 19–32)
Calcium: 9.3 mg/dL (ref 8.4–10.5)
Candida species: NEGATIVE
Chlamydia, DNA Probe: NEGATIVE
Creatinine, Ser: 0.81 mg/dL (ref 0.40–1.20)
GC Probe Amp, Genital: NEGATIVE
Glucose, Urine, Semiquant: NEGATIVE
HDL: 49 mg/dL (ref >39)
Protein, U semiquant: 100
Trichomonal Vaginitis: NEGATIVE
pH: 5

## 2006-11-21 ENCOUNTER — Ambulatory Visit (HOSPITAL_COMMUNITY): Admission: RE | Admit: 2006-11-21 | Discharge: 2006-11-21 | Payer: Self-pay | Admitting: *Deleted

## 2006-12-06 ENCOUNTER — Encounter: Payer: Self-pay | Admitting: *Deleted

## 2006-12-16 ENCOUNTER — Other Ambulatory Visit: Admission: RE | Admit: 2006-12-16 | Discharge: 2006-12-16 | Payer: Self-pay | Admitting: Gynecology

## 2006-12-16 ENCOUNTER — Encounter (INDEPENDENT_AMBULATORY_CARE_PROVIDER_SITE_OTHER): Payer: Self-pay | Admitting: Gynecology

## 2006-12-16 ENCOUNTER — Ambulatory Visit: Payer: Self-pay | Admitting: Gynecology

## 2006-12-26 ENCOUNTER — Encounter (INDEPENDENT_AMBULATORY_CARE_PROVIDER_SITE_OTHER): Payer: Self-pay | Admitting: Family Medicine

## 2006-12-26 ENCOUNTER — Ambulatory Visit: Payer: Self-pay | Admitting: Sports Medicine

## 2006-12-26 DIAGNOSIS — E785 Hyperlipidemia, unspecified: Secondary | ICD-10-CM | POA: Insufficient documentation

## 2006-12-26 DIAGNOSIS — E669 Obesity, unspecified: Secondary | ICD-10-CM | POA: Insufficient documentation

## 2006-12-27 LAB — CONVERTED CEMR LAB: Pap Smear: NORMAL

## 2007-01-06 ENCOUNTER — Ambulatory Visit: Payer: Self-pay | Admitting: Family Medicine

## 2007-01-17 ENCOUNTER — Encounter (INDEPENDENT_AMBULATORY_CARE_PROVIDER_SITE_OTHER): Payer: Self-pay | Admitting: *Deleted

## 2007-03-14 ENCOUNTER — Encounter (INDEPENDENT_AMBULATORY_CARE_PROVIDER_SITE_OTHER): Payer: Self-pay | Admitting: *Deleted

## 2007-03-14 ENCOUNTER — Encounter: Admission: RE | Admit: 2007-03-14 | Discharge: 2007-03-14 | Payer: Self-pay | Admitting: Gastroenterology

## 2007-04-03 ENCOUNTER — Encounter (INDEPENDENT_AMBULATORY_CARE_PROVIDER_SITE_OTHER): Payer: Self-pay | Admitting: *Deleted

## 2007-04-04 ENCOUNTER — Encounter (INDEPENDENT_AMBULATORY_CARE_PROVIDER_SITE_OTHER): Payer: Self-pay | Admitting: *Deleted

## 2007-04-04 ENCOUNTER — Telehealth (INDEPENDENT_AMBULATORY_CARE_PROVIDER_SITE_OTHER): Payer: Self-pay | Admitting: *Deleted

## 2007-04-07 ENCOUNTER — Inpatient Hospital Stay (HOSPITAL_COMMUNITY): Admission: AD | Admit: 2007-04-07 | Discharge: 2007-04-08 | Payer: Self-pay | Admitting: Obstetrics and Gynecology

## 2007-04-11 ENCOUNTER — Ambulatory Visit (HOSPITAL_COMMUNITY): Admission: RE | Admit: 2007-04-11 | Discharge: 2007-04-11 | Payer: Self-pay | Admitting: Gastroenterology

## 2007-04-11 ENCOUNTER — Encounter (INDEPENDENT_AMBULATORY_CARE_PROVIDER_SITE_OTHER): Payer: Self-pay | Admitting: *Deleted

## 2007-04-19 ENCOUNTER — Encounter (INDEPENDENT_AMBULATORY_CARE_PROVIDER_SITE_OTHER): Payer: Self-pay | Admitting: *Deleted

## 2007-05-03 ENCOUNTER — Telehealth: Payer: Self-pay | Admitting: *Deleted

## 2007-05-12 ENCOUNTER — Ambulatory Visit: Payer: Self-pay | Admitting: Family Medicine

## 2007-05-12 LAB — CONVERTED CEMR LAB
Bilirubin Urine: NEGATIVE
Glucose, Urine, Semiquant: NEGATIVE
Protein, U semiquant: 30
WBC, UA: >20 cells/hpf
pH: 5.5

## 2007-06-02 ENCOUNTER — Telehealth: Payer: Self-pay | Admitting: *Deleted

## 2007-06-20 ENCOUNTER — Ambulatory Visit: Payer: Self-pay | Admitting: *Deleted

## 2007-06-20 DIAGNOSIS — M79609 Pain in unspecified limb: Secondary | ICD-10-CM

## 2007-06-20 LAB — CONVERTED CEMR LAB
BUN: 12 mg/dL (ref 6–23)
Chloride: 101 meq/L (ref 96–112)
Hgb A1c MFr Bld: 8.9 %
Potassium: 3.9 meq/L (ref 3.5–5.3)
Sodium: 141 meq/L (ref 135–145)

## 2007-08-07 ENCOUNTER — Encounter (INDEPENDENT_AMBULATORY_CARE_PROVIDER_SITE_OTHER): Payer: Self-pay | Admitting: *Deleted

## 2007-09-17 ENCOUNTER — Emergency Department (HOSPITAL_COMMUNITY): Admission: EM | Admit: 2007-09-17 | Discharge: 2007-09-17 | Payer: Self-pay | Admitting: Family Medicine

## 2007-09-23 ENCOUNTER — Emergency Department (HOSPITAL_COMMUNITY): Admission: EM | Admit: 2007-09-23 | Discharge: 2007-09-23 | Payer: Self-pay | Admitting: Family Medicine

## 2007-10-11 ENCOUNTER — Ambulatory Visit: Payer: Self-pay | Admitting: *Deleted

## 2007-10-11 DIAGNOSIS — E785 Hyperlipidemia, unspecified: Secondary | ICD-10-CM

## 2007-10-11 LAB — CONVERTED CEMR LAB: Blood Glucose, Fingerstick: 271

## 2007-10-31 LAB — CONVERTED CEMR LAB
CO2: 24 meq/L (ref 19–32)
Chloride: 100 meq/L (ref 96–112)
Potassium: 3.4 meq/L — ABNORMAL LOW (ref 3.5–5.3)
Sodium: 137 meq/L (ref 135–145)

## 2007-11-14 ENCOUNTER — Ambulatory Visit: Payer: Self-pay | Admitting: *Deleted

## 2007-11-14 LAB — CONVERTED CEMR LAB
BUN: 10 mg/dL (ref 6–23)
CO2: 29 meq/L (ref 19–32)
Chloride: 100 meq/L (ref 96–112)
Creatinine, Ser: 0.87 mg/dL (ref 0.40–1.20)
Glucose, Bld: 223 mg/dL — ABNORMAL HIGH (ref 70–99)
Potassium: 3.9 meq/L (ref 3.5–5.3)

## 2007-12-11 ENCOUNTER — Ambulatory Visit: Payer: Self-pay | Admitting: Internal Medicine

## 2007-12-13 ENCOUNTER — Telehealth (INDEPENDENT_AMBULATORY_CARE_PROVIDER_SITE_OTHER): Payer: Self-pay | Admitting: Pharmacy Technician

## 2007-12-13 ENCOUNTER — Encounter: Payer: Self-pay | Admitting: *Deleted

## 2007-12-14 ENCOUNTER — Ambulatory Visit: Payer: Self-pay | Admitting: *Deleted

## 2007-12-14 LAB — CONVERTED CEMR LAB
CO2: 28 meq/L (ref 19–32)
Calcium: 9 mg/dL (ref 8.4–10.5)
Chloride: 100 meq/L (ref 96–112)
Creatinine, Ser: 0.73 mg/dL (ref 0.40–1.20)
Glucose, Bld: 120 mg/dL — ABNORMAL HIGH (ref 70–99)
LDL Cholesterol: 120 mg/dL — ABNORMAL HIGH (ref 0–99)

## 2007-12-21 ENCOUNTER — Ambulatory Visit: Payer: Self-pay | Admitting: Family Medicine

## 2007-12-21 ENCOUNTER — Encounter: Payer: Self-pay | Admitting: Family Medicine

## 2007-12-21 LAB — CONVERTED CEMR LAB
GC Probe Amp, Genital: NEGATIVE
Nitrite: NEGATIVE
Specific Gravity, Urine: 1.025
Whiff Test: POSITIVE

## 2007-12-24 ENCOUNTER — Encounter: Payer: Self-pay | Admitting: Family Medicine

## 2007-12-26 ENCOUNTER — Encounter: Payer: Self-pay | Admitting: *Deleted

## 2008-01-08 ENCOUNTER — Telehealth: Payer: Self-pay | Admitting: *Deleted

## 2008-01-10 ENCOUNTER — Emergency Department (HOSPITAL_COMMUNITY): Admission: EM | Admit: 2008-01-10 | Discharge: 2008-01-10 | Payer: Self-pay | Admitting: Emergency Medicine

## 2008-01-12 ENCOUNTER — Encounter: Payer: Self-pay | Admitting: Family Medicine

## 2008-01-12 ENCOUNTER — Ambulatory Visit: Payer: Self-pay | Admitting: Family Medicine

## 2008-01-16 LAB — CONVERTED CEMR LAB

## 2008-01-24 ENCOUNTER — Emergency Department (HOSPITAL_COMMUNITY): Admission: EM | Admit: 2008-01-24 | Discharge: 2008-01-24 | Payer: Self-pay | Admitting: Emergency Medicine

## 2008-02-12 ENCOUNTER — Encounter (INDEPENDENT_AMBULATORY_CARE_PROVIDER_SITE_OTHER): Payer: Self-pay | Admitting: *Deleted

## 2008-02-13 ENCOUNTER — Ambulatory Visit (HOSPITAL_COMMUNITY): Admission: RE | Admit: 2008-02-13 | Discharge: 2008-02-13 | Payer: Self-pay | Admitting: Gastroenterology

## 2008-02-23 ENCOUNTER — Telehealth: Payer: Self-pay | Admitting: *Deleted

## 2008-03-11 ENCOUNTER — Ambulatory Visit: Payer: Self-pay | Admitting: *Deleted

## 2008-03-11 ENCOUNTER — Ambulatory Visit (HOSPITAL_COMMUNITY): Admission: RE | Admit: 2008-03-11 | Discharge: 2008-03-11 | Payer: Self-pay | Admitting: *Deleted

## 2008-03-11 DIAGNOSIS — R0789 Other chest pain: Secondary | ICD-10-CM

## 2008-03-11 DIAGNOSIS — M25519 Pain in unspecified shoulder: Secondary | ICD-10-CM

## 2008-03-11 LAB — CONVERTED CEMR LAB: Blood Glucose, Fingerstick: 176

## 2008-03-12 ENCOUNTER — Ambulatory Visit (HOSPITAL_COMMUNITY): Admission: RE | Admit: 2008-03-12 | Discharge: 2008-03-12 | Payer: Self-pay | Admitting: *Deleted

## 2008-03-27 ENCOUNTER — Ambulatory Visit: Payer: Self-pay | Admitting: Sports Medicine

## 2008-04-17 ENCOUNTER — Ambulatory Visit: Payer: Self-pay | Admitting: *Deleted

## 2008-04-25 ENCOUNTER — Ambulatory Visit: Payer: Self-pay | Admitting: Family Medicine

## 2008-04-25 LAB — CONVERTED CEMR LAB
Bilirubin Urine: NEGATIVE
Glucose, Urine, Semiquant: NEGATIVE
Ketones, urine, test strip: NEGATIVE
Nitrite: NEGATIVE
Specific Gravity, Urine: >=1.03
Urobilinogen, UA: 0.2
pH: 5.5

## 2008-04-26 ENCOUNTER — Encounter: Payer: Self-pay | Admitting: Family Medicine

## 2008-05-02 ENCOUNTER — Ambulatory Visit: Payer: Self-pay | Admitting: Sports Medicine

## 2008-05-27 ENCOUNTER — Emergency Department (HOSPITAL_COMMUNITY): Admission: EM | Admit: 2008-05-27 | Discharge: 2008-05-27 | Payer: Self-pay | Admitting: Family Medicine

## 2008-06-07 ENCOUNTER — Ambulatory Visit: Payer: Self-pay | Admitting: Family Medicine

## 2008-06-07 ENCOUNTER — Encounter: Payer: Self-pay | Admitting: Family Medicine

## 2008-06-07 LAB — CONVERTED CEMR LAB
Bilirubin Urine: NEGATIVE
Glucose, Urine, Semiquant: NEGATIVE
Protein, U semiquant: 30
Specific Gravity, Urine: >=1.03
pH: 5.5

## 2008-06-10 LAB — CONVERTED CEMR LAB: GC Probe Amp, Urine: NEGATIVE

## 2008-06-12 ENCOUNTER — Encounter (INDEPENDENT_AMBULATORY_CARE_PROVIDER_SITE_OTHER): Payer: Self-pay | Admitting: *Deleted

## 2008-06-19 ENCOUNTER — Other Ambulatory Visit: Admission: RE | Admit: 2008-06-19 | Discharge: 2008-06-19 | Payer: Self-pay | Admitting: *Deleted

## 2008-06-19 ENCOUNTER — Ambulatory Visit: Payer: Self-pay | Admitting: *Deleted

## 2008-06-19 ENCOUNTER — Encounter (INDEPENDENT_AMBULATORY_CARE_PROVIDER_SITE_OTHER): Payer: Self-pay | Admitting: *Deleted

## 2008-06-19 LAB — CONVERTED CEMR LAB
Candida species: NEGATIVE
Gardnerella vaginalis: NEGATIVE
Trichomonal Vaginitis: NEGATIVE

## 2008-06-19 LAB — HM PAP SMEAR: HM Pap smear: NEGATIVE

## 2008-06-24 LAB — CONVERTED CEMR LAB
Chlamydia, DNA Probe: NEGATIVE
Chloride: 100 meq/L (ref 96–112)
Cholesterol: 218 mg/dL — ABNORMAL HIGH (ref 0–200)
GC Probe Amp, Genital: NEGATIVE
GFR calc Af Amer: >60 mL/min (ref >60)
GFR calc non Af Amer: >60 mL/min (ref >60)
Potassium: 3.3 meq/L — ABNORMAL LOW (ref 3.5–5.3)
Sodium: 141 meq/L (ref 135–145)
Triglycerides: 188 mg/dL — ABNORMAL HIGH (ref <150)
VLDL: 38 mg/dL (ref 0–40)

## 2008-06-25 ENCOUNTER — Telehealth (INDEPENDENT_AMBULATORY_CARE_PROVIDER_SITE_OTHER): Payer: Self-pay | Admitting: *Deleted

## 2008-06-27 ENCOUNTER — Ambulatory Visit (HOSPITAL_COMMUNITY): Admission: RE | Admit: 2008-06-27 | Discharge: 2008-06-27 | Payer: Self-pay | Admitting: *Deleted

## 2008-07-01 ENCOUNTER — Encounter: Admission: RE | Admit: 2008-07-01 | Discharge: 2008-07-01 | Payer: Self-pay | Admitting: *Deleted

## 2008-07-10 ENCOUNTER — Encounter (INDEPENDENT_AMBULATORY_CARE_PROVIDER_SITE_OTHER): Payer: Self-pay | Admitting: Internal Medicine

## 2008-07-10 ENCOUNTER — Ambulatory Visit: Payer: Self-pay | Admitting: Internal Medicine

## 2008-07-10 LAB — CONVERTED CEMR LAB
BUN: 14 mg/dL (ref 6–23)
Chloride: 103 meq/L (ref 96–112)
Glucose, Bld: 121 mg/dL — ABNORMAL HIGH (ref 70–99)
Potassium: 4.4 meq/L (ref 3.5–5.3)
Sodium: 143 meq/L (ref 135–145)

## 2008-07-25 ENCOUNTER — Ambulatory Visit: Payer: Self-pay | Admitting: Internal Medicine

## 2008-07-25 ENCOUNTER — Encounter (INDEPENDENT_AMBULATORY_CARE_PROVIDER_SITE_OTHER): Payer: Self-pay | Admitting: Internal Medicine

## 2008-07-25 LAB — CONVERTED CEMR LAB
Blood Glucose, Fingerstick: 112
Creatinine, Urine: 173.3 mg/dL
Microalb Creat Ratio: 46 mg/g — ABNORMAL HIGH (ref 0.0–30.0)
Microalb, Ur: 7.97 mg/dL — ABNORMAL HIGH (ref 0.00–1.89)

## 2008-07-28 ENCOUNTER — Emergency Department (HOSPITAL_COMMUNITY): Admission: EM | Admit: 2008-07-28 | Discharge: 2008-07-28 | Payer: Self-pay | Admitting: Family Medicine

## 2008-08-07 ENCOUNTER — Emergency Department (HOSPITAL_COMMUNITY): Admission: EM | Admit: 2008-08-07 | Discharge: 2008-08-07 | Payer: Self-pay | Admitting: Family Medicine

## 2008-08-15 ENCOUNTER — Encounter (INDEPENDENT_AMBULATORY_CARE_PROVIDER_SITE_OTHER): Payer: Self-pay | Admitting: Internal Medicine

## 2008-09-02 ENCOUNTER — Emergency Department (HOSPITAL_COMMUNITY): Admission: EM | Admit: 2008-09-02 | Discharge: 2008-09-02 | Payer: Self-pay | Admitting: Emergency Medicine

## 2008-09-05 ENCOUNTER — Emergency Department (HOSPITAL_COMMUNITY): Admission: EM | Admit: 2008-09-05 | Discharge: 2008-09-05 | Payer: Self-pay | Admitting: Emergency Medicine

## 2008-10-03 ENCOUNTER — Ambulatory Visit: Payer: Self-pay | Admitting: Infectious Diseases

## 2008-10-03 LAB — CONVERTED CEMR LAB
Blood Glucose, Fingerstick: 126
Hgb A1c MFr Bld: 6.4 %

## 2008-10-15 ENCOUNTER — Telehealth: Payer: Self-pay | Admitting: Family Medicine

## 2008-10-16 ENCOUNTER — Encounter: Payer: Self-pay | Admitting: Family Medicine

## 2008-10-16 ENCOUNTER — Ambulatory Visit: Payer: Self-pay | Admitting: Family Medicine

## 2008-10-16 LAB — CONVERTED CEMR LAB: Whiff Test: NEGATIVE

## 2008-10-17 LAB — CONVERTED CEMR LAB
Chlamydia, DNA Probe: NEGATIVE
GC Probe Amp, Genital: NEGATIVE

## 2008-12-22 ENCOUNTER — Emergency Department (HOSPITAL_COMMUNITY): Admission: EM | Admit: 2008-12-22 | Discharge: 2008-12-22 | Payer: Self-pay | Admitting: Family Medicine

## 2008-12-28 ENCOUNTER — Telehealth: Payer: Self-pay | Admitting: Family Medicine

## 2008-12-29 ENCOUNTER — Ambulatory Visit: Payer: Self-pay | Admitting: Family Medicine

## 2008-12-30 ENCOUNTER — Encounter: Payer: Self-pay | Admitting: Family Medicine

## 2008-12-30 ENCOUNTER — Inpatient Hospital Stay (HOSPITAL_COMMUNITY): Admission: EM | Admit: 2008-12-30 | Discharge: 2009-01-02 | Payer: Self-pay | Admitting: Emergency Medicine

## 2008-12-31 ENCOUNTER — Encounter: Payer: Self-pay | Admitting: Family Medicine

## 2009-01-13 ENCOUNTER — Ambulatory Visit: Payer: Self-pay | Admitting: Family Medicine

## 2009-01-13 DIAGNOSIS — K5900 Constipation, unspecified: Secondary | ICD-10-CM | POA: Insufficient documentation

## 2009-01-16 ENCOUNTER — Encounter (INDEPENDENT_AMBULATORY_CARE_PROVIDER_SITE_OTHER): Payer: Self-pay | Admitting: Internal Medicine

## 2009-01-23 ENCOUNTER — Encounter: Admission: RE | Admit: 2009-01-23 | Discharge: 2009-01-23 | Payer: Self-pay | Admitting: Family Medicine

## 2009-01-27 ENCOUNTER — Ambulatory Visit: Payer: Self-pay | Admitting: Internal Medicine

## 2009-01-27 ENCOUNTER — Ambulatory Visit (HOSPITAL_COMMUNITY): Admission: RE | Admit: 2009-01-27 | Discharge: 2009-01-27 | Payer: Self-pay | Admitting: Internal Medicine

## 2009-01-27 DIAGNOSIS — R042 Hemoptysis: Secondary | ICD-10-CM | POA: Insufficient documentation

## 2009-01-27 LAB — CONVERTED CEMR LAB: Blood Glucose, Fingerstick: 164

## 2009-01-28 ENCOUNTER — Encounter (INDEPENDENT_AMBULATORY_CARE_PROVIDER_SITE_OTHER): Payer: Self-pay | Admitting: Internal Medicine

## 2009-01-28 LAB — CONVERTED CEMR LAB
Basophils Absolute: 0 10*3/uL (ref 0.0–0.1)
CO2: 27 meq/L (ref 19–32)
Cholesterol: 170 mg/dL (ref 0–200)
Creatinine, Ser: 0.74 mg/dL (ref 0.40–1.20)
Eosinophils Absolute: 0.1 10*3/uL (ref 0.0–0.7)
Eosinophils Relative: 2 % (ref 0–5)
Glucose, Bld: 148 mg/dL — ABNORMAL HIGH (ref 70–99)
HCT: 40.6 % (ref 36.0–46.0)
HDL: 53 mg/dL (ref >39)
Lymphocytes Relative: 38 % (ref 12–46)
MCV: 85.7 fL (ref >=78.0)
Neutrophils Relative %: 52 % (ref 43–77)
Platelets: 258 10*3/uL (ref 150–400)
RDW: 12.3 % (ref 11.5–15.5)
Total Bilirubin: 0.6 mg/dL (ref 0.3–1.2)
Total CHOL/HDL Ratio: 3.2
Triglycerides: 223 mg/dL — ABNORMAL HIGH (ref <150)
VLDL: 45 mg/dL — ABNORMAL HIGH (ref 0–40)

## 2009-02-06 ENCOUNTER — Ambulatory Visit: Payer: Self-pay | Admitting: Family Medicine

## 2009-02-25 ENCOUNTER — Emergency Department (HOSPITAL_COMMUNITY): Admission: EM | Admit: 2009-02-25 | Discharge: 2009-02-25 | Payer: Self-pay | Admitting: Emergency Medicine

## 2009-03-07 ENCOUNTER — Emergency Department (HOSPITAL_COMMUNITY): Admission: EM | Admit: 2009-03-07 | Discharge: 2009-03-07 | Payer: Self-pay | Admitting: Family Medicine

## 2009-03-08 ENCOUNTER — Ambulatory Visit: Payer: Self-pay | Admitting: Family Medicine

## 2009-03-08 ENCOUNTER — Inpatient Hospital Stay (HOSPITAL_COMMUNITY): Admission: EM | Admit: 2009-03-08 | Discharge: 2009-03-11 | Payer: Self-pay | Admitting: Emergency Medicine

## 2009-03-11 LAB — CONVERTED CEMR LAB
CO2: 28 meq/L
Calcium: 9 mg/dL
Creatinine, Ser: 0.78 mg/dL
HCT: 36 %
Hemoglobin: 12.4 g/dL
MCV: 96.5 fL
Sodium: 139 meq/L
WBC: 5.4 10*3/uL

## 2009-03-25 ENCOUNTER — Ambulatory Visit: Payer: Self-pay | Admitting: Family Medicine

## 2009-03-25 DIAGNOSIS — K56609 Unspecified intestinal obstruction, unspecified as to partial versus complete obstruction: Secondary | ICD-10-CM | POA: Insufficient documentation

## 2009-04-08 ENCOUNTER — Emergency Department (HOSPITAL_COMMUNITY): Admission: EM | Admit: 2009-04-08 | Discharge: 2009-04-08 | Payer: Self-pay | Admitting: Family Medicine

## 2009-04-11 ENCOUNTER — Ambulatory Visit: Payer: Self-pay | Admitting: Internal Medicine

## 2009-04-11 DIAGNOSIS — H6690 Otitis media, unspecified, unspecified ear: Secondary | ICD-10-CM | POA: Insufficient documentation

## 2009-04-11 DIAGNOSIS — J209 Acute bronchitis, unspecified: Secondary | ICD-10-CM

## 2009-04-16 ENCOUNTER — Encounter: Payer: Self-pay | Admitting: Family Medicine

## 2009-04-16 ENCOUNTER — Ambulatory Visit: Payer: Self-pay | Admitting: Family Medicine

## 2009-04-16 DIAGNOSIS — N644 Mastodynia: Secondary | ICD-10-CM

## 2009-06-13 ENCOUNTER — Telehealth (INDEPENDENT_AMBULATORY_CARE_PROVIDER_SITE_OTHER): Payer: Self-pay | Admitting: Internal Medicine

## 2009-06-16 ENCOUNTER — Emergency Department (HOSPITAL_COMMUNITY): Admission: EM | Admit: 2009-06-16 | Discharge: 2009-06-16 | Payer: Self-pay | Admitting: Emergency Medicine

## 2009-06-20 DIAGNOSIS — Z9289 Personal history of other medical treatment: Secondary | ICD-10-CM

## 2009-06-20 HISTORY — DX: Personal history of other medical treatment: Z92.89

## 2009-06-23 ENCOUNTER — Encounter (INDEPENDENT_AMBULATORY_CARE_PROVIDER_SITE_OTHER): Payer: Self-pay | Admitting: Internal Medicine

## 2009-07-01 ENCOUNTER — Encounter: Admission: RE | Admit: 2009-07-01 | Discharge: 2009-07-01 | Payer: Self-pay | Admitting: Internal Medicine

## 2009-07-01 LAB — HM MAMMOGRAPHY: HM Mammogram: NEGATIVE

## 2009-07-28 ENCOUNTER — Telehealth: Payer: Self-pay | Admitting: Internal Medicine

## 2009-08-06 ENCOUNTER — Ambulatory Visit: Payer: Self-pay | Admitting: Internal Medicine

## 2009-08-06 LAB — CONVERTED CEMR LAB
Cholesterol: 187 mg/dL (ref 0–200)
HCT: 39.5 % (ref 36.0–46.0)
Hemoglobin: 13.4 g/dL (ref 12.0–15.0)
LDL Cholesterol: 87 mg/dL (ref 0–99)
MCHC: 33.9 g/dL (ref 30.0–36.0)
MCV: 87 fL (ref >=78.0)
RBC: 4.54 M/uL (ref 3.87–5.11)
Total CHOL/HDL Ratio: 3.3
VLDL: 44 mg/dL — ABNORMAL HIGH (ref 0–40)
WBC: 8.8 10*3/uL (ref 4.0–10.5)

## 2009-08-10 ENCOUNTER — Emergency Department (HOSPITAL_COMMUNITY): Admission: EM | Admit: 2009-08-10 | Discharge: 2009-08-10 | Payer: Self-pay | Admitting: Emergency Medicine

## 2009-08-13 ENCOUNTER — Ambulatory Visit: Payer: Self-pay | Admitting: Internal Medicine

## 2009-08-13 LAB — CONVERTED CEMR LAB: Blood Glucose, Fingerstick: 154

## 2009-08-18 ENCOUNTER — Encounter: Payer: Self-pay | Admitting: Internal Medicine

## 2009-08-19 ENCOUNTER — Ambulatory Visit: Payer: Self-pay | Admitting: Family Medicine

## 2009-08-19 ENCOUNTER — Encounter: Payer: Self-pay | Admitting: Family Medicine

## 2009-08-19 ENCOUNTER — Other Ambulatory Visit: Admission: RE | Admit: 2009-08-19 | Discharge: 2009-08-19 | Payer: Self-pay | Admitting: Family Medicine

## 2009-08-19 DIAGNOSIS — R12 Heartburn: Secondary | ICD-10-CM | POA: Insufficient documentation

## 2009-08-19 DIAGNOSIS — N760 Acute vaginitis: Secondary | ICD-10-CM | POA: Insufficient documentation

## 2009-08-19 LAB — CONVERTED CEMR LAB: Pap Smear: NEGATIVE

## 2009-08-20 ENCOUNTER — Telehealth: Payer: Self-pay | Admitting: Family Medicine

## 2009-08-21 ENCOUNTER — Encounter: Payer: Self-pay | Admitting: Internal Medicine

## 2009-08-22 ENCOUNTER — Telehealth: Payer: Self-pay | Admitting: *Deleted

## 2009-08-22 ENCOUNTER — Telehealth: Payer: Self-pay | Admitting: Family Medicine

## 2009-09-02 ENCOUNTER — Emergency Department (HOSPITAL_COMMUNITY): Admission: EM | Admit: 2009-09-02 | Discharge: 2009-09-02 | Payer: Self-pay | Admitting: Family Medicine

## 2009-10-07 ENCOUNTER — Ambulatory Visit: Payer: Self-pay | Admitting: Internal Medicine

## 2009-10-07 LAB — CONVERTED CEMR LAB: Blood Glucose, Fingerstick: 218

## 2009-10-16 ENCOUNTER — Encounter: Payer: Self-pay | Admitting: Internal Medicine

## 2009-10-27 ENCOUNTER — Encounter: Payer: Self-pay | Admitting: Internal Medicine

## 2009-11-14 ENCOUNTER — Encounter: Admission: RE | Admit: 2009-11-14 | Discharge: 2009-11-14 | Payer: Self-pay | Admitting: Cardiovascular Disease

## 2010-01-01 ENCOUNTER — Emergency Department (HOSPITAL_COMMUNITY)
Admission: EM | Admit: 2010-01-01 | Discharge: 2010-01-01 | Payer: Self-pay | Source: Home / Self Care | Admitting: Emergency Medicine

## 2010-01-25 ENCOUNTER — Encounter: Payer: Self-pay | Admitting: Family Medicine

## 2010-01-25 ENCOUNTER — Encounter: Payer: Self-pay | Admitting: Internal Medicine

## 2010-01-25 ENCOUNTER — Encounter: Payer: Self-pay | Admitting: *Deleted

## 2010-02-03 NOTE — Progress Notes (Signed)
Summary: med refill/gp  Phone Note Refill Request Message from:  Fax from Pharmacy on July 28, 2009 9:18 AM  Refills Requested: Medication #1:  PRAVACHOL 40 MG TABS Take one tablet daily for cholesterol.   Last Refilled: 07/17/2009 Last appt.04/11/09.   Method Requested: Electronic Initial call taken by: Chinita Pester RN,  July 28, 2009 9:18 AM  Follow-up for Phone Call        Rx denied because needs an office visit. Thank you. Follow-up by: Deatra Robinson MD,  July 28, 2009 11:57 PM  Additional Follow-up for Phone Call Additional follow up Details #1::        Appt. scheduled Aug.3  w/Dr. Denton Meek. Additional Follow-up by: Chinita Pester RN,  July 29, 2009 2:28 PM    Additional Follow-up for Phone Call Additional follow up Details #2::    prescripton sent electronically to the patient's pharmacy. Follow-up by: Deatra Robinson MD,  July 29, 2009 10:16 PM  Prescriptions: PRAVACHOL 40 MG TABS (PRAVASTATIN SODIUM) Take one tablet daily for cholesterol.  #32 x 6   Entered and Authorized by:   Deatra Robinson MD   Signed by:   Deatra Robinson MD on 07/29/2009   Method used:   [facility]        [facility] [address] [phone]* (retail)       [address]       [place], [place]  [address]       Ph: [phone] or [phone]       Fax: [phone]   RxID:   [id number]

## 2010-02-03 NOTE — Assessment & Plan Note (Signed)
Summary: EST-2 MONTH F/U VISIT/CH   Vital Signs:  Patient profile:   55 year old female Height:      64 inches (162.56 cm) Weight:      202.2 pounds (91.91 kg) BMI:     34.83 Temp:     97.7 degrees F oral Pulse rate:   94 / minute BP sitting:   130 / 85  (right arm)  Vitals Entered By: Chinita Pester RN (October 07, 2009 2:30 PM) CC: 2 month f/u.  Request Rx for a new meter. Right leg pain/cramps. Is Patient Diabetic? Yes Did you bring your meter with you today? Yes Pain Assessment Patient in pain? yes     Location: right leg Intensity: 7 Type: aching Onset of pain  Intermittent; has  cramping something. Nutritional Status BMI of > 30 = obese CBG Result 218  Have you ever been in a relationship where you felt threatened, hurt or afraid?No   Does patient need assistance? Functional Status Self care Ambulation Normal   Primary Care Provider:  Elby Showers MD  CC:  2 month f/u.  Request Rx for a new meter. Right leg pain/cramps..  History of Present Illness: 1. C/o LBP and left shoulder pain; Hx of chronic duration sp old injury. Sx worsened after patient was changing bed sheets and had to lift a matress. Felt like 'she pulled her muscles." pain is 4/10 in intnesity;no radiculopathy; no nocturnal signs; no bowel or bladder incontinence; no fever, or chills. REquests a referral for a physical therapy. 2. DM --requests info on diet and weight managment. 3. RF's on meds.  Depression History:      The patient denies a depressed mood most of the day and a diminished interest in her usual daily activities.         Preventive Screening-Counseling & Management  Alcohol-Tobacco     Alcohol drinks/day: 0     Smoking Status: never  Caffeine-Diet-Exercise     Nutrition Referrals: no     Does Patient Exercise: yes     Type of exercise: walking     Times/week: 4  Current Problems (verified): 1)  Acute Bronchitis  (ICD-466.0) 2)  Unspecified Otitis Media  (ICD-382.9) 3)   Hemoptysis Unspecified  (ICD-786.30) 4)  Shoulder Pain, Left  (ICD-719.41) 5)  Chest Pain, Atypical  (ICD-786.59) 6)  Hyperlipidemia  (ICD-272.4) 7)  Diabetes Mellitus, Type II  (ICD-250.00) 8)  Foot Pain  (ICD-729.5) 9)  Abdominal Pain, Chronic  (ICD-789.00) 10)  Back Pain  (ICD-724.5) 11)  Gerd  (ICD-530.81) 12)  Injury, Cervical Root  (ICD-953.0) 13)  Systolic Murmur  (ICD-785.2) 14)  Lymphadenopathy  (ICD-785.6) 15)  Deafness  (ICD-389.9) 16)  Hypertension  (ICD-401.9) 17)  Asthma  (ICD-493.90)  Medications Prior to Update: 1)  Bd Ultra-Fine Lancets  Misc (Lancets) .... To Test Blood Glucose Daily 2)  Freestyle Lite  Strp (Glucose Blood) .... To Test Blood Sugar One Time  Daily 3)  Prilosec 40 Mg  Cpdr (Omeprazole) .... Take 1 Tablet By Mouth Once A Day 4)  Metformin Hcl 1000 Mg Tabs (Metformin Hcl) .... Take 1 Tablet By Mouth Twice A Day 5)  Pravachol 40 Mg Tabs (Pravastatin Sodium) .... Take One Tablet Daily For Cholesterol. 6)  Lisinopril-Hydrochlorothiazide 20-12.5 Mg Tabs (Lisinopril-Hydrochlorothiazide) .... Take One Tablet Daily For Blood Pressure. 7)  Proair Hfa 108 (90 Base) Mcg/act Aers (Albuterol Sulfate) .... Take One Puff Twice A Day As Needed 8)  Gemfibrozil 600 Mg Tabs (Gemfibrozil) .... Take  One Tablet By Mouth Bid 9)  Diovan Hct 80-12.5 Mg Tabs (Valsartan-Hydrochlorothiazide) .... Take 1 Tablet By Mouth Once A Day 10)  Miralax  Powd (Polyethylene Glycol 3350) .... Use One Capful Per Glass of Water and Drink By Mouth Once Daily Until Bm Is Achieved; Then Prn 11)  Freestyle Lite  Devi (Blood Glucose Monitoring Suppl) .... Check Blood Glucose Once Daily Before Breakfast or As Directed  Current Medications (verified): 1)  Bd Ultra-Fine Lancets  Misc (Lancets) .... To Test Blood Glucose Daily 2)  Freestyle Lite  Strp (Glucose Blood) .... To Test Blood Sugar One Time  Daily 3)  Prilosec 40 Mg  Cpdr (Omeprazole) .... Take 1 Tablet By Mouth Once A Day 4)  Metformin Hcl  1000 Mg Tabs (Metformin Hcl) .... Take 1 Tablet By Mouth Twice A Day 5)  Pravachol 40 Mg Tabs (Pravastatin Sodium) .... Take One Tablet Daily For Cholesterol. 6)  Lisinopril-Hydrochlorothiazide 20-12.5 Mg Tabs (Lisinopril-Hydrochlorothiazide) .... Take One Tablet Daily For Blood Pressure. 7)  Proair Hfa 108 (90 Base) Mcg/act Aers (Albuterol Sulfate) .... Take One Puff Twice A Day As Needed 8)  Gemfibrozil 600 Mg Tabs (Gemfibrozil) .... Take One Tablet By Mouth Bid 9)  Diovan Hct 80-12.5 Mg Tabs (Valsartan-Hydrochlorothiazide) .... Take 1 Tablet By Mouth Once A Day 10)  Miralax  Powd (Polyethylene Glycol 3350) .... Use One Capful Per Glass of Water and Drink By Mouth Once Daily Until Bm Is Achieved; Then Prn 11)  Freestyle Lite  Devi (Blood Glucose Monitoring Suppl) .... Check Blood Glucose Once Daily Before Breakfast or As Directed  Allergies (verified): 1)  ! Pcn 2)  ! Codeine 3)  ! 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 Dutch Quint Abnormal Pap smear- ASCUS  Ventricular septal defect per echo in 2004 Asthma per chart; no PFT's  Past Surgical History: Last updated: 03/08/2006 Cholecystectomy - 11/04/1993,  Tubal ligation - 10/05/1978 Colposcopy  Social History: Last updated: 08/22/2006 Daughter, Randa Evens; Day care worker; Non-smoker.  Son incarcerated.  Assists with the care of her grandchild.    Review of Systems      See HPI  Physical Exam  General:  Well-developed,well-nourished,in no acute distress; alert,appropriate and cooperative throughout examination Head:  normocephalic and atraumatic.   Eyes:  No corneal or conjunctival inflammation noted. EOMI. Perrla. Funduscopic exam benign, without hemorrhages, exudates or papilledema. Vision grossly normal. Ears:  External ear exam shows no significant lesions or deformities.   Otoscopic examination reveals clear canals, tympanic membranes are intact bilaterally without bulging, retraction, inflammation or discharge. Patient is deaf bilaterally. Nose:  no external deformity, no nasal discharge, and mucosal erythema.   Mouth:  Oral mucosa and oropharynx without lesions or exudates.  Teeth in good repair. Neck:  full ROM and no masses.   Lungs:  Normal respiratory effort, chest expands symmetrically. Lungs are clear to auscultation, no crackles or wheezes. Heart:  Normal rate and regular rhythm. S1 and S2 normal without gallop, murmur, click, rub or other extra sounds. Abdomen:  Bowel sounds positive,abdomen soft and non-tender without masses, organomegaly or hernias noted. Msk:  normal ROM, no joint tenderness, no joint swelling, and no joint warmth.   mild left paraspinal TTP to C8-T4 and L3-L4. Pulses:  2+ Extremities:  No clubbing, cyanosis, edema, or deformity noted with normal full range of motion of all joints.    Diabetes Management  Exam:    Foot Exam (with socks and/or shoes not present):       Sensory-Pinprick/Light touch:          Left medial foot (L-4): normal          Left dorsal foot (L-5): normal          Left lateral foot (S-1): normal          Right medial foot (L-4): normal          Right dorsal foot (L-5): normal          Right lateral foot (S-1): normal       Sensory-Monofilament:          Left foot: normal          Right foot: normal       Inspection:          Left foot: normal          Right foot: normal       Nails:          Left foot: normal          Right foot: normal    Foot Exam by Podiatrist:       Date: 10/07/2009       Results: no diabetic findings       Done by: Frederich Chick Exam:       Eye Exam done here today          Results: normal   Impression & Recommendations:  Problem # 1:  DIABETES MELLITUS, TYPE II (ICD-250.00) Spent 60 minutes on diet, exercise and weight managment. Patient refused a referral to a  dietician (Ms. Victory Dakin).  The following medications were removed from the medication list:    Lisinopril-hydrochlorothiazide 20-12.5 Mg Tabs (Lisinopril-hydrochlorothiazide) .Marland Kitchen... Take one tablet daily for blood pressure. Her updated medication list for this problem includes:    Metformin Hcl 1000 Mg Tabs (Metformin hcl) .Marland Kitchen... Take 1 tablet by mouth twice a day    Diovan Hct 80-12.5 Mg Tabs (Valsartan-hydrochlorothiazide) .Marland Kitchen... Take 1 tablet by mouth once a day  Orders: Capillary Blood Glucose/CBG ([address])  Problem # 2:  SHOULDER PAIN, LEFT (ICD-719.41) Assessment: Deteriorated Continue with OTC tylenol as needed. Avoid strenuous pushing or lifting objects. Orders: Physical Therapy Referral (PT)  Discussed shoulder exercises, use of moist heat or ice, and medication.   Problem # 3:  BACK PAIN (ICD-724.5) Assessment: Deteriorated Chronic. No "red flags." back exercises taught. Will follow up in 4 weeks or sooner. Orders: Physical Therapy Referral (PT)  Discussed use of moist heat or ice, modified activities, medications, and stretching/strengthening exercises. Back care instructions given. To be seen in 2 weeks if no improvement; sooner if worsening of symptoms.   Problem # 4:  HYPERTENSION (ICD-401.9) Assessment: Unchanged Controlled. Weight managemnt, low-salt diet reviewed with the patient. The following medications were removed from the medication list:    Lisinopril-hydrochlorothiazide 20-12.5 Mg Tabs (Lisinopril-hydrochlorothiazide) .Marland Kitchen... Take one tablet daily for blood pressure. Her updated medication list for this problem includes:    Diovan Hct 80-12.5 Mg Tabs (Valsartan-hydrochlorothiazide) .Marland Kitchen... Take 1 tablet by mouth once a day  BP today: 130/85 Prior BP: 108/78 (08/13/2009)  Labs Reviewed: K+: 3.7 (01/28/2009) Creat: : 0.74 (01/28/2009)   Chol: 187 (08/06/2009)   HDL: 56 (08/06/2009)   LDL: 87 (08/06/2009)   TG: 219 (08/06/2009)  Complete Medication List: 1)  Bd  Ultra-fine Lancets Misc (Lancets) .... To test blood glucose daily 2)  Freestyle Lite Strp (Glucose blood) .... To test blood sugar one time  daily 3)  Prilosec 40 Mg Cpdr (Omeprazole) .... Take 1 tablet by mouth once a day 4)  Metformin Hcl 1000 Mg Tabs (Metformin hcl) .... Take 1 tablet by mouth twice a day 5)  Pravachol 40 Mg Tabs (Pravastatin sodium) .... Take one tablet daily for cholesterol. 6)  Proair Hfa 108 (90 Base) Mcg/act Aers (Albuterol sulfate) .... Take one puff twice a day as needed 7)  Gemfibrozil 600 Mg Tabs (Gemfibrozil) .... Take one tablet by mouth bid 8)  Diovan Hct 80-12.5 Mg Tabs (Valsartan-hydrochlorothiazide) .... Take 1 tablet by mouth once a day 9)  Miralax Powd (Polyethylene glycol 3350) .... Use one capful per glass of water and drink by mouth once daily until bm is achieved; then prn 10)  Freestyle Lite Devi (Blood glucose monitoring suppl) .... Check blood glucose once daily before breakfast or as directed  Other Orders: Influenza Vaccine NON MCR (19147) Pneumococcal Vaccine (82956) Admin 1st Vaccine (21308)  Patient Instructions: 1)  Please, follow up with a physical therapy. 2)  Call with any questions. 3)  You received a flu shot today. 4)  Follow up with Dr. Denton Meek in 3 months or sooner. Prescriptions: LISINOPRIL-HYDROCHLOROTHIAZIDE 20-12.5 MG TABS (LISINOPRIL-HYDROCHLOROTHIAZIDE) Take one tablet daily for blood pressure.  #32 x 11   Entered and Authorized by:   Deatra Robinson MD   Signed by:   Deatra Robinson MD on 10/07/2009   Method used:   [facility]        [facility] [address] [phone]* (retail)       [address]       [place], [place]  [address]       Ph: [phone] or [phone]       Fax: [phone]   RxID:   [id number] PRAVACHOL 40 MG TABS (PRAVASTATIN SODIUM) Take one tablet daily for cholesterol.  #32 x 11   Entered and Authorized by:   Deatra Robinson MD   Signed by:   Deatra Robinson MD on  10/07/2009   Method used:   Electronically to        [facility] [address] [phone]* (retail)       [address]       [place], [place]  [address]       Ph: [phone] or [phone]       Fax: [phone]   RxID:   [id number] FREESTYLE LITE  STRP (GLUCOSE BLOOD) to test blood sugar one time  daily Brand medically necessary #30 x 11   Entered and Authorized by:   Deatra Robinson MD   Signed by:   Deatra Robinson MD on 10/07/2009   Method used:   [facility]ronically to        [facility] [address] [phone]* (retail)       [address]       [place], [place]  [address]       Ph: [phone] or [phone]       Fax: [phone]   RxID:   [id number] PRILOSEC 40 MG  CPDR (OMEPRAZOLE) Take 1 tablet by mouth once a day  #31 x 11   Entered and Authorized by:   Deatra Robinson MD   Signed by:   Deatra Robinson MD on 10/07/2009   Method used:   Electronically  to        [facility] [address] [phone]* (retail)       [address]       [place], [place]  [address]       Ph: [phone] or [phone]       Fax: [phone]   RxID:   [id number] METFORMIN HCL 1000 MG TABS (METFORMIN HCL) Take 1 tablet by mouth twice a day  #62 Tablet x 11   Entered and Authorized by:   Deatra Robinson MD   Signed by:   Deatra Robinson MD on 10/07/2009   Method used:   Electronically to        [facility] [address] [phone]* (retail)       [address]       [place], [place]  [address]       Ph: [phone] or [phone]       Fax: [phone]   RxID:   [id number]   Prevention & Chronic Care Immunizations   Influenza vaccine: Fluvax Non-MCR  (10/07/2009)   Influenza vaccine due: 09/05/2010    Tetanus booster: 08/06/2009: Tdap   Td booster deferral: Not indicated  (10/07/2009)   Tetanus booster due: 08/07/2019    Pneumococcal vaccine: Pneumovax  (10/07/2009)   Pneumococcal vaccine due:  10/08/2014  Colorectal Screening   Hemoccult: Done.  (08/04/1996)   Hemoccult action/deferral: Not indicated  (07/25/2008)    Colonoscopy: Results: Polyp.  Pathology:  Hyperplastic polyp.       (02/01/2008)   Colonoscopy action/deferral: Not indicated  (08/06/2009)   Colonoscopy due: 01/31/2013  Other Screening   Pap smear: NEGATIVE FOR INTRAEPITHELIAL LESIONS OR MALIGNANCY.  (06/19/2008)   Pap smear action/deferral: Deferred-3 yr interval  (08/06/2009)   Pap smear due: 07/25/2009    Mammogram: ASSESSMENT: Negative - BI-RADS 1^MM DIGITAL SCREENING  (07/01/2009)   Mammogram action/deferral: Deferred-2 yr interval  (10/07/2009)   Mammogram due: 01/2010   Smoking status: never  (10/07/2009)  Diabetes Mellitus   HgbA1C: 7.3  (08/06/2009)   Hemoglobin A1C due: 02/06/2010    Eye exam: normal  (10/07/2009)   Diabetic eye exam action/deferral: Ophthalmology referral  (08/06/2009)   Eye exam due: 10/2010    Foot exam: yes  (10/07/2009)   Foot exam action/deferral: Do today   High risk foot: No  (08/06/2009)   Foot care education: Done  (07/25/2008)   Foot exam due: 07/25/2009    Urine microalbumin/creatinine ratio: 91.2  (08/06/2009)   Urine microalbumin action/deferral: Ordered   Urine microalbumin/cr due: 07/25/2009    Diabetes flowsheet reviewed?: Yes   Progress toward A1C goal: Unchanged    Stage of readiness to change (diabetes management): Action  Lipids   Total Cholesterol: 187  (08/06/2009)   Lipid panel action/deferral: Lipid Panel ordered   LDL: 87  (08/06/2009)   LDL Direct: Not documented   HDL: 56  (08/06/2009)   Triglycerides: 219  (08/06/2009)   Lipid panel due: 02/06/2010    SGOT (AST): 19  (01/28/2009)   BMP action: Ordered   SGPT (ALT): 20  (01/28/2009)   Alkaline phosphatase: 91  (01/28/2009)   Total bilirubin: 0.6  (01/28/2009)    Lipid flowsheet reviewed?: Yes   Progress toward LDL goal: At goal  Hypertension   Last Blood Pressure: 130 /  85  (10/07/2009)   Serum creatinine: 0.74  (01/28/2009)   BMP action: Ordered   Serum potassium 3.7  (  01/28/2009)    Hypertension flowsheet reviewed?: Yes   Progress toward BP goal: Deteriorated  Self-Management Support :   Personal Goals (by the next clinic visit) :     Personal A1C goal: 7  (10/03/2008)     Personal blood pressure goal: 130/80  (10/03/2008)     Personal LDL goal: 100  (10/03/2008)    Patient will work on the following items until the next clinic visit to reach self-care goals:     Medications and monitoring: take my medicines every day, bring all of my medications to every visit, examine my feet every day  (10/07/2009)     Eating: drink diet soda or water instead of juice or soda, eat more vegetables, use fresh or frozen vegetables, eat foods that are low in salt, eat baked foods instead of fried foods, eat fruit for snacks and desserts  (10/07/2009)     Activity: take a 30 minute walk every day  (10/07/2009)    Diabetes self-management support: CBG self-monitoring log, Written self-care plan  (10/07/2009)   Diabetes care plan printed   Last diabetes self-management training by diabetes educator: 12/11/2007   Last medical nutrition therapy: 12/11/2007    Hypertension self-management support: Written self-care plan  (10/07/2009)   Hypertension self-care plan printed.    Lipid self-management support: Written self-care plan  (10/07/2009)   Lipid self-care plan printed.   Nursing Instructions: Give Flu vaccine today Give Pneumovax today      Pneumovax Vaccine    Vaccine Type: Pneumovax    Site: left deltoid    Mfr: Merck    Dose: 0.5 ml    Route: IM    Given by: Chinita Pester RN    Exp. Date: 03/19/2011    Lot #: [id number]    VIS given: 12/09/08 version given October 07, 2009.  Influenza Vaccine    Vaccine Type: Fluvax Non-MCR    Site: right deltoid    Mfr: GlaxoSmithKline    Dose: 0.5 ml    Route: IM    Given by: Chinita Pester RN    Exp. Date:  07/04/2010    Lot #: [id number]    VIS given: 07/29/09 version given October 07, 2009.  Flu Vaccine Consent Questions    Do you have a history of severe allergic reactions to this vaccine? no    Any prior history of allergic reactions to egg and/or gelatin? no    Do you have a sensitivity to the preservative Thimersol? no    Do you have a past history of Guillan-Barre Syndrome? no    Do you currently have an acute febrile illness? no    Have you ever had a severe reaction to latex? no    Vaccine information given and explained to patient? yes    Are you currently pregnant? no

## 2010-02-03 NOTE — Letter (Signed)
Summary: [facility]   Imported By: Margie Billet 11/06/2009 09:03:14  _____________________________________________________________________  External Attachment:    Type:   Image     Comment:   External Document

## 2010-02-03 NOTE — Consult Note (Signed)
Summary: [facility]   Imported By: Margie Billet 10/10/2009 09:38:45  _____________________________________________________________________  External Attachment:    Type:   Image     Comment:   External Document  Appended Document: [facility]    Diabetic Eye Exam  Procedure date:  08/21/2009  Findings:      No diabetic retinopathy.     Procedures Next Due Date:    Diabetic Eye Exam: 09/2010   Diabetic Eye Exam  Procedure date:  08/21/2009  Findings:      No diabetic retinopathy.     Procedures Next Due Date:    Diabetic Eye Exam: 09/2010

## 2010-02-03 NOTE — Assessment & Plan Note (Signed)
Summary: ACUTE-URGENT CARE-F/U COUGH AND EAR INFECTION/CFB CALLED CARE...   Vital Signs:  Patient profile:   55 year old female Height:      68 inches Weight:      199.9 pounds BMI:     30.50 Temp:     98.2 degrees F oral Pulse rate:   80 / minute BP sitting:   126 / 83  (right arm)  Vitals Entered By: Filomena Jungling NT II (April 11, 2009 3:49 PM) CC: COUGH AND EARS HURT Is Patient Diabetic? Yes Did you bring your meter with you today? No Pain Assessment Patient in pain? yes     Location: EARS Intensity: 3 Type: aching Nutritional Status BMI of > 30 = obese  Have you ever been in a relationship where you felt threatened, hurt or afraid?No   Does patient need assistance? Functional Status Self care Ambulation Normal   Primary Care Provider:  Elby Showers MD  CC:  COUGH AND EARS HURT.  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 Dutch Quint Abnormal Pap smear- ASCUS  Ventricular septal defect per echo in 2004 Asthma per chart; no PFT's   presents after having continued symptoms of ear infection and cough. This started about a week ago. She visited urgent care and was prescribed anasthetic ear drop and cough medicine but no antibiotics. She continues to have ear pain, cough, feeling of uneasiness, tiredness and  unable to eat for day and a half with nausea. She has no vomiting. She has no fever. She had sick contact in form of some kids in the family who also had ear infection and fever.   Preventive Screening-Counseling & Management  Alcohol-Tobacco     Alcohol drinks/day: 0     Smoking Status: never  Caffeine-Diet-Exercise     Does Patient Exercise: yes     Type of exercise: walking     Times/week: 4  Allergies (verified): 1)  ! Pcn 2)  ! Codeine 3)  ! Bc Headache Powder  Past History:  Past Medical History: Last  updated: 03/11/2008 Diabetes Recurrent PID Hypertension Back pain C5/C6 nerve encroachment for which she has been evaluated by Dr. Dutch Quint Abnormal Pap smear- ASCUS  Ventricular septal defect per echo in 2004 Asthma per chart; no PFT's  Past Surgical History: Last updated: 03/08/2006 Cholecystectomy - 11/04/1993,  Tubal ligation - 10/05/1978 Colposcopy  Family History: Last updated: 07-15-2007 Mother died of liver cancer.  Social History: Last updated: 08/22/2006 Daughter, Randa Evens; Day care worker; Non-smoker.  Son incarcerated.  Assists with the care of her grandchild.    Risk Factors: Alcohol Use: 0 (04/11/2009) Exercise: yes (04/11/2009)  Risk Factors: Smoking Status: never (04/11/2009)  Review of Systems      See HPI  Physical Exam  General:  alert and overweight-appearing.   Head:  normocephalic and atraumatic.   Eyes:  vision grossly intact, pupils equal, pupils round, and pupils reactive to light.   Ears:  right tympanic membrane retracted, not shiny, small exudate. left tympanic membrane is normal.  Mouth:  thorat is red and infalmmed Lungs:  occassion wheeze. no rhonchi, no rales. good bilateral air entry.  Heart:  Normal rate and regular rhythm. S1 and S2 normal without gallop, murmur, click, rub or other extra sounds. Abdomen:  Bowel sounds positive,abdomen soft and non-tender without masses, organomegaly or hernias noted. Msk:  No deformity or  scoliosis noted of thoracic or lumbar spine.   Neurologic:  No cranial nerve deficits noted. Station and gait are normal. Plantar reflexes are down-going bilaterally. DTRs are symmetrical throughout. Sensory, motor and coordinative functions appear intact. Psych:  Cognition and judgment appear intact. Alert and cooperative with normal attention span and concentration. No apparent delusions, illusions, hallucinations   Impression & Recommendations:  Problem # 1:  ACUTE BRONCHITIS (ICD-466.0) pt has acute bronchitis with  middle ear infection. I would give her augmntin or amoxicillin but she is allergic to penicilin. I will give her omnicef instead. Will also advise to use proair twice a day.  Her updated medication list for this problem includes:    Tussionex Pennkinetic Er 8-10 Mg/75ml Lqcr (Chlorpheniramine-hydrocodone) .Marland Kitchen... Take 5 ml two times a day as needed for cough.    Cefdinir 300 Mg Caps (Cefdinir) .Marland Kitchen... Take 1 tablet by mouth two times a day    Proair Hfa 108 (90 Base) Mcg/act Aers (Albuterol sulfate) .Marland Kitchen... Take one puff twice a day as needed  Problem # 2:  UNSPECIFIED OTITIS MEDIA (ICD-382.9) see above.  Her updated medication list for this problem includes:    Cefdinir 300 Mg Caps (Cefdinir) .Marland Kitchen... Take 1 tablet by mouth two times a day  Problem # 3:  ASTHMA (ICD-493.90) stable. She is not on a controller. Since she has cough which I think could be coming from asthma, secondary to bronchitis, I will advise her to use proair for one week. She will return for checkup again.  Her updated medication list for this problem includes:    Proair Hfa 108 (90 Base) Mcg/act Aers (Albuterol sulfate) .Marland Kitchen... Take one puff twice a day as needed  Problem # 4:  HYPERLIPIDEMIA (ICD-272.4) at goal in January. Will not chage meds.  Her updated medication list for this problem includes:    Pravachol 40 Mg Tabs (Pravastatin sodium) .Marland Kitchen... Take one tablet daily for cholesterol.  Labs Reviewed: SGOT: 19 (01/28/2009)   SGPT: 20 (01/28/2009)   HDL:53 (01/28/2009), 58 (06/19/2008)  LDL:72 (01/28/2009), 122 (69/62/9528)  Chol:170 (01/28/2009), 218 (06/19/2008)  Trig:223 (01/28/2009), 188 (06/19/2008)  Problem # 5:  DIABETES MELLITUS, TYPE II (ICD-250.00) A1C has detoriated. This needs to be addressed on following visit and recheck is due at this months end. She may need glipizide added.  Her updated medication list for this problem includes:    Metformin Hcl 1000 Mg Tabs (Metformin hcl) .Marland Kitchen... Take 1 tablet by mouth twice a  day    Lisinopril-hydrochlorothiazide 20-12.5 Mg Tabs (Lisinopril-hydrochlorothiazide) .Marland Kitchen... Take one tablet daily for blood pressure.  Labs Reviewed: Creat: 0.74 (01/28/2009)    Reviewed HgBA1c results: 7.7 (01/27/2009)  6.4 (10/03/2008)  Complete Medication List: 1)  Bd Ultra-fine Lancets Misc (Lancets) .... To test blood glucose daily 2)  Freestyle Lite Strp (Glucose blood) .... To test blood sugar one time  daily 3)  Prilosec 40 Mg Cpdr (Omeprazole) .... Take 1 tablet by mouth once a day 4)  Metformin Hcl 1000 Mg Tabs (Metformin hcl) .... Take 1 tablet by mouth twice a day 5)  Norvasc 5 Mg Tabs (Amlodipine besylate) .... Take 1 tablet by mouth once a day 6)  Pravachol 40 Mg Tabs (Pravastatin sodium) .... Take one tablet daily for cholesterol. 7)  Lisinopril-hydrochlorothiazide 20-12.5 Mg Tabs (Lisinopril-hydrochlorothiazide) .... Take one tablet daily for blood pressure. 8)  Tussionex Pennkinetic Er 8-10 Mg/32ml Lqcr (Chlorpheniramine-hydrocodone) .... Take 5 ml two times a day as needed for cough. 9)  Cefdinir 300 Mg Caps (  Cefdinir) .... Take 1 tablet by mouth two times a day 10)  Proair Hfa 108 (90 Base) Mcg/act Aers (Albuterol sulfate) .... Take one puff twice a day as needed  Patient Instructions: 1)  Please schedule a follow-up appointment in 1 month. 2)  Call early if you get fevers, nausea and vomiting. 3)  Call if your pain worsens and you do not feel better in coming 3-4 days.  Prescriptions: PROAIR HFA 108 (90 BASE) MCG/ACT AERS (ALBUTEROL SULFATE) Take one puff twice a day as needed  #1 x 3   Entered and Authorized by:   Clerance Lav MD   Signed by:   Clerance Lav MD on 04/11/2009   Method used:   Electronically to        [facility] [id number]* (retail)       [address]       [place], [place]  [address]       Ph: [phone]       Fax: [phone]   RxID:   [id number] CEFDINIR 300 MG CAPS (CEFDINIR) Take 1 tablet by mouth two times  a day  #20 x 0   Entered and Authorized by:   Clerance Lav MD   Signed by:   Clerance Lav MD on 04/11/2009   Method used:   Print then Give to Patient   RxID:   [id number] TUSSIONEX PENNKINETIC ER 8-10 MG/5ML LQCR (CHLORPHENIRAMINE-HYDROCODONE) Take 5 ml two times a day as needed for cough.  #60 ml x 0   Entered and Authorized by:   Clerance Lav MD   Signed by:   Clerance Lav MD on 04/11/2009   Method used:   Print then Give to Patient   RxID:   [id number]

## 2010-02-03 NOTE — Letter (Addendum)
Summary: BLOOD SUGAR   BLOOD SUGAR   Imported By: Margie Billet 10/08/2009 14:00:16  _____________________________________________________________________  External Attachments:     1. Type:   Image          Comment:   External Document    2. Type:   Image          Comment:   External Document

## 2010-02-03 NOTE — Progress Notes (Signed)
Summary: Refill/gh  Phone Note Refill Request Message from:  Fax from Pharmacy on June 13, 2009 3:42 PM  Refills Requested: Medication #1:  LISINOPRIL-HYDROCHLOROTHIAZIDE 20-12.5 MG TABS Take one tablet daily for blood pressure.   Last Refilled: 05/04/2009  Method Requested: Electronic Initial call taken by: Angelina Ok RN,  June 13, 2009 3:42 PM    Prescriptions: LISINOPRIL-HYDROCHLOROTHIAZIDE 20-12.5 MG TABS (LISINOPRIL-HYDROCHLOROTHIAZIDE) Take one tablet daily for blood pressure.  #32 x 3   Entered and Authorized by:   Elby Showers MD   Signed by:   Elby Showers MD on 06/13/2009   Method used:   [facility]        [facility] [address] [phone]* (retail)       [address]       [place], [place]  [address]       Ph: [phone] or [phone]       Fax: [phone]   RxID:   [id number]

## 2010-02-03 NOTE — Assessment & Plan Note (Signed)
Summary: ER/FU FOR STOMACH VIRUS/CFB   Vital Signs:  Patient profile:   55 year old female Height:      64 inches (162.56 cm) Weight:      202.2 pounds (91.91 kg) BMI:     34.83 Temp:     98.4 degrees F Pulse rate:   83 / minute Pulse (ortho):   95 / minute BP sitting:   115 / 75  (right arm) BP standing:   108 / 78 Cuff size:   large  Vitals Entered By: Dorie Rank RN (August 13, 2009 11:13 AM) CC: just eating soup since last Sunday - stomach pain - had labs and IV - dr "told me I had a virus - scan and pelvic exam exam normal" Is Patient Diabetic? Yes Did you bring your meter with you today? No Pain Assessment Patient in pain? yes     Location: stomach Intensity: 7 Type: sharp Onset of pain  off and on - started about 2 week Nutritional Status BMI of > 30 = obese CBG Result 154  Have you ever been in a relationship where you felt threatened, hurt or afraid?No   Does patient need assistance? Functional Status Self care Ambulation Normal Comments no BM in 4 days after she took a suppository- usually goes every 2 days- no further vomiting since last week - nausea yesterday w/out vomiting but no nausea today   Primary Care Provider:  Elby Showers MD  CC:  just eating soup since last Sunday - stomach pain - had labs and IV - dr "told me I had a virus - scan and pelvic exam exam normal".  History of Present Illness: Follow up from ED for an abdominal pain. Please, refer to ED report. Patient reports resolution of abdominal pain. Feels constipated since started on Vicodin that was given in ED.  Preventive Screening-Counseling & Management  Alcohol-Tobacco     Alcohol drinks/day: 0     Smoking Status: never  Caffeine-Diet-Exercise     Nutrition Referrals: no  Current Problems (verified): 1)  Acute Bronchitis  (ICD-466.0) 2)  Unspecified Otitis Media  (ICD-382.9) 3)  Hemoptysis Unspecified  (ICD-786.30) 4)  Shoulder Pain, Left  (ICD-719.41) 5)  Chest Pain,  Atypical  (ICD-786.59) 6)  Hyperlipidemia  (ICD-272.4) 7)  Diabetes Mellitus, Type II  (ICD-250.00) 8)  Foot Pain  (ICD-729.5) 9)  Abdominal Pain, Chronic  (ICD-789.00) 10)  Back Pain  (ICD-724.5) 11)  Gerd  (ICD-530.81) 12)  Injury, Cervical Root  (ICD-953.0) 13)  Systolic Murmur  (ICD-785.2) 14)  Lymphadenopathy  (ICD-785.6) 15)  Deafness  (ICD-389.9) 16)  Hypertension  (ICD-401.9) 17)  Asthma  (ICD-493.90)  Medications Prior to Update: 1)  Bd Ultra-Fine Lancets  Misc (Lancets) .... To Test Blood Glucose Daily 2)  Freestyle Lite  Strp (Glucose Blood) .... To Test Blood Sugar One Time  Daily 3)  Prilosec 40 Mg  Cpdr (Omeprazole) .... Take 1 Tablet By Mouth Once A Day 4)  Metformin Hcl 1000 Mg Tabs (Metformin Hcl) .... Take 1 Tablet By Mouth Twice A Day 5)  Pravachol 40 Mg Tabs (Pravastatin Sodium) .... Take One Tablet Daily For Cholesterol. 6)  Lisinopril-Hydrochlorothiazide 20-12.5 Mg Tabs (Lisinopril-Hydrochlorothiazide) .... Take One Tablet Daily For Blood Pressure. 7)  Proair Hfa 108 (90 Base) Mcg/act Aers (Albuterol Sulfate) .... Take One Puff Twice A Day As Needed 8)  Gemfibrozil 600 Mg Tabs (Gemfibrozil) .... Take One Tablet By Mouth Bid 9)  Diovan Hct 80-12.5 Mg Tabs (Valsartan-Hydrochlorothiazide) .... Take 1  Tablet By Mouth Once A Day  Allergies (verified): 1)  ! Pcn 2)  ! Codeine 3)  ! Bc Headache Powder PMH reviewed for relevance  Review of Systems       per HPI  Physical Exam  General:  Well-developed,well-nourished,in no acute distress; alert,appropriate and cooperative throughout examination Eyes:  No corneal or conjunctival inflammation noted. EOMI. Perrla. Funduscopic exam benign, without hemorrhages, exudates or papilledema. Vision grossly normal. Mouth:  Oral mucosa and oropharynx without lesions or exudates.  Teeth in good repair. Neck:  full ROM and no masses.   Lungs:  Normal respiratory effort, chest expands symmetrically. Lungs are clear to  auscultation, no crackles or wheezes. Heart:  Normal rate and regular rhythm. S1 and S2 normal without gallop, murmur, click, rub or other extra sounds. Abdomen:  Bowel sounds positive,abdomen soft and non-tender without masses, organomegaly or hernias noted. Msk:  normal ROM, no joint tenderness, no joint swelling, and no joint warmth.   Extremities:  No clubbing, cyanosis, edema, or deformity noted with normal full range of motion of all joints.   Neurologic:  alert & oriented X3.   Skin:  Intact without suspicious lesions or rashes Inguinal Nodes:  No significant adenopathy Psych:  Cognition and judgment appear intact. Alert and cooperative with normal attention span and concentration. No apparent delusions, illusions, hallucinations   Impression & Recommendations:  Problem # 1:  ABDOMINAL PAIN, CHRONIC (ICD-789.00)  Follow up from ED. Full work up including a CT scan and blood tests did not reveal any pathology.  ?psychosomatic vs gas  D/C vicodin due to constipation and resolution of pain. Rationale explained to the patient via an interpreter (patient is deaf). Miralax, hydration and high fiber diet  Follow up PRN  Discussed symptom control with the patient.   Complete Medication List: 1)  Bd Ultra-fine Lancets Misc (Lancets) .... To test blood glucose daily 2)  Freestyle Lite Strp (Glucose blood) .... To test blood sugar one time  daily 3)  Prilosec 40 Mg Cpdr (Omeprazole) .... Take 1 tablet by mouth once a day 4)  Metformin Hcl 1000 Mg Tabs (Metformin hcl) .... Take 1 tablet by mouth twice a day 5)  Pravachol 40 Mg Tabs (Pravastatin sodium) .... Take one tablet daily for cholesterol. 6)  Lisinopril-hydrochlorothiazide 20-12.5 Mg Tabs (Lisinopril-hydrochlorothiazide) .... Take one tablet daily for blood pressure. 7)  Proair Hfa 108 (90 Base) Mcg/act Aers (Albuterol sulfate) .... Take one puff twice a day as needed 8)  Gemfibrozil 600 Mg Tabs (Gemfibrozil) .... Take one tablet by  mouth bid 9)  Diovan Hct 80-12.5 Mg Tabs (Valsartan-hydrochlorothiazide) .... Take 1 tablet by mouth once a day 10)  Miralax Powd (Polyethylene glycol 3350) .... Use one capful per glass of water and drink by mouth once daily until bm is achieved; then prn 11)  Freestyle Lite Devi (Blood glucose monitoring suppl) .... Check blood glucose once daily before breakfast or as directed  Other Orders: Capillary Blood Glucose/CBG ([address])  Patient Instructions: 1)  Please, stop taking Vicodin 2)  Pick up a prescription for a stool softener. 3)  Call with any questions. 4)  Follow up with your PCP in 3 months or sooner Prescriptions: FREESTYLE LITE  DEVI (BLOOD GLUCOSE MONITORING SUPPL) Check blood glucose once daily before breakfast or as directed  #1 x 1   Entered and Authorized by:   Deatra Robinson MD   Signed by:   Deatra Robinson MD on 08/13/2009   Method used:   [facility]  [facility] [address] [phone]* (retail)       [address]       [place], [place]  [address]       Ph: [phone] or [phone]       Fax: [phone]   RxID:   [id number] FREESTYLE LITE  STRP (GLUCOSE BLOOD) to test blood sugar one time  daily Brand medically necessary #30 x 11   Entered and Authorized by:   Deatra Robinson MD   Signed by:   Deatra Robinson MD on 08/13/2009   Method used:   [facility]ronically to        [facility] [address] [phone]* (retail)       [address]       [place], [place]  [address]       Ph: [phone] or [phone]       Fax: [phone]   RxID:   [id number] BD ULTRA-FINE LANCETS  MISC (LANCETS) to test blood glucose daily  #100 x 11   Entered and Authorized by:   Deatra Robinson MD   Signed by:   Deatra Robinson MD on 08/13/2009   Method used:   Electronically to        [facility] [address] [phone]* (retail)       [address]       [place], [place]  [address]       Ph: [phone]  or [phone]       Fax: [phone]   RxID:   [id number] MIRALAX  POWD (POLYETHYLENE GLYCOL 3350) Use one capful per glass of water and drink by mouth once daily until BM is achieved; then PRN  #1 x 0   Entered and Authorized by:   Deatra Robinson MD   Signed by:   Deatra Robinson MD on 08/13/2009   Method used:   Electronically to        [facility] [address] [phone]* (retail)       [address]       [place], [place]  [address]       Ph: [phone] or [phone]       Fax: [phone]   RxID:   [id number]    Prevention & Chronic Care Immunizations   Influenza vaccine: Fluvax 3+  (10/03/2008)   Influenza vaccine due: 09/04/2009    Tetanus booster: 08/06/2009: Tdap   Tetanus booster due: 08/07/2019    Pneumococcal vaccine: Not documented  Colorectal Screening   Hemoccult: Done.  (08/04/1996)   Hemoccult action/deferral: Not indicated  (07/25/2008)    Colonoscopy: Results: Polyp.  Pathology:  Hyperplastic polyp.       (02/01/2008)   Colonoscopy action/deferral: Not indicated  (08/06/2009)   Colonoscopy due: 01/31/2013  Other Screening   Pap smear: NEGATIVE FOR INTRAEPITHELIAL LESIONS OR MALIGNANCY.  (06/19/2008)   Pap smear action/deferral: Deferred-3 yr interval  (08/06/2009)   Pap smear due: 07/25/2009    Mammogram: ASSESSMENT: Negative - BI-RADS 1^MM DIGITAL SCREENING  (07/01/2009)   Mammogram action/deferral: Deferred  (08/06/2009)   Mammogram due: 01/2010   Smoking status: never  (08/13/2009)  Diabetes Mellitus   HgbA1C: 7.3  (08/06/2009)   Hemoglobin A1C due: 02/06/2010    Eye exam: Not documented   Diabetic eye exam action/deferral: Ophthalmology referral  (08/06/2009)    Foot exam: yes  (  08/06/2009)   Foot exam action/deferral: Do today   High risk foot: No  (08/06/2009)   Foot care education: Done  (07/25/2008)   Foot exam due: 07/25/2009    Urine microalbumin/creatinine ratio: 91.2  (08/06/2009)   Urine  microalbumin action/deferral: Ordered   Urine microalbumin/cr due: 07/25/2009  Lipids   Total Cholesterol: 187  (08/06/2009)   Lipid panel action/deferral: Lipid Panel ordered   LDL: 87  (08/06/2009)   LDL Direct: Not documented   HDL: 56  (08/06/2009)   Triglycerides: 219  (08/06/2009)   Lipid panel due: 02/06/2010    SGOT (AST): 19  (01/28/2009)   BMP action: Ordered   SGPT (ALT): 20  (01/28/2009)   Alkaline phosphatase: 91  (01/28/2009)   Total bilirubin: 0.6  (01/28/2009)  Hypertension   Last Blood Pressure: 115 / 75  (08/13/2009)   Serum creatinine: 0.74  (01/28/2009)   BMP action: Ordered   Serum potassium 3.7  (01/28/2009)  Self-Management Support :   Personal Goals (by the next clinic visit) :     Personal A1C goal: 7  (10/03/2008)     Personal blood pressure goal: 130/80  (10/03/2008)     Personal LDL goal: 100  (10/03/2008)    Patient will work on the following items until the next clinic visit to reach self-care goals:     Medications and monitoring: take my medicines every day, bring all of my medications to every visit, examine my feet every day  (08/13/2009)     Eating: drink diet soda or water instead of juice or soda, eat more vegetables, eat baked foods instead of fried foods  (08/13/2009)     Activity: take a 30 minute walk every day  (08/13/2009)    Diabetes self-management support: Resources for patients handout, Written self-care plan  (08/13/2009)   Diabetes care plan printed   Last diabetes self-management training by diabetes educator: 12/11/2007   Last medical nutrition therapy: 12/11/2007    Hypertension self-management support: Resources for patients handout, Written self-care plan  (08/13/2009)   Hypertension self-care plan printed.    Lipid self-management support: Resources for patients handout, Written self-care plan  (08/13/2009)   Lipid self-care plan printed.      Resource handout printed.

## 2010-02-03 NOTE — Assessment & Plan Note (Signed)
Summary: RA/3 MONTH FU VISIT/CFB   Vital Signs:  Patient profile:   55 year old female Height:      68 inches Weight:      203.5 pounds BMI:     31.05 Temp:     98.2 degrees F oral Pulse rate:   87 / minute BP sitting:   135 / 90  (right arm)  Vitals Entered By: Filomena Jungling NT II (August 06, 2009 8:59 AM)   Diabetic Foot Exam Foot Inspection Is there a history of a foot ulcer?              No Is there a foot ulcer now?              No Can the patient see the bottom of their feet?          Yes Are the shoes appropriate in style and fit?          No Is there swelling or an abnormal foot shape?          No Are the toenails long?                No Are the toenails thick?                No Are the toenails ingrown?              No Is there heavy callous build-up?              No Is there pain in the calf muscle (Intermittent claudication) when walking?    NoIs there a claw toe deformity?              No Is there elevated skin temperature?            No Is there limited ankle dorsiflexion?            No Is there foot or ankle muscle weakness?            No  Diabetic Foot Care Education  High Risk Feet? No   10-g (5.07) Semmes-Weinstein Monofilament Test Performed by: Filomena Jungling NT II          Right Foot          Left Foot Site 1         normal         normal Site 2         normal         normal Site 3         normal         normal Site 4         normal         normal Site 5         normal         normal Site 6         normal         normal Site 7         normal         normal Site 8         normal         normal Site 9         normal         normal  Impression      normal         normal CC: COUGH, NEED REFILLS, ?COLONOSOCOPY  Is Patient Diabetic? Yes  Did you bring your meter with you today? No Pain Assessment Patient in pain? no      Nutritional Status BMI of > 30 = obese CBG Result 129  Have you ever been in a relationship where you felt threatened, hurt or  afraid?No   Does patient need assistance? Functional Status Self care Ambulation Normal   Primary Care Provider:  Elby Showers MD  CC:  COUGH, NEED REFILLS, and ?COLONOSOCOPY .  History of Present Illness: 1. C/O right sided pelvic pain for 4 months; worse with bneding and sexual intercourse; no radiculopathy; no fever, chills, diarrhea, constipation, N/V or vaginal discharge. UTD on her WWE. Patient is postmenopausal for 7 years. Denies similar episodes in the past.  Preventive Screening-Counseling & Management  Alcohol-Tobacco     Alcohol drinks/day: 0     Smoking Status: never  Caffeine-Diet-Exercise     Does Patient Exercise: yes     Type of exercise: walking     Times/week: 4  Problems Prior to Update: 1)  Acute Bronchitis  (ICD-466.0) 2)  Unspecified Otitis Media  (ICD-382.9) 3)  Hemoptysis Unspecified  (ICD-786.30) 4)  Shoulder Pain, Left  (ICD-719.41) 5)  Chest Pain, Atypical  (ICD-786.59) 6)  Hyperlipidemia  (ICD-272.4) 7)  Diabetes Mellitus, Type II  (ICD-250.00) 8)  Foot Pain  (ICD-729.5) 9)  Abdominal Pain, Chronic  (ICD-789.00) 10)  Back Pain  (ICD-724.5) 11)  Gerd  (ICD-530.81) 12)  Injury, Cervical Root  (ICD-953.0) 13)  Systolic Murmur  (ICD-785.2) 14)  Lymphadenopathy  (ICD-785.6) 15)  Deafness  (ICD-389.9) 16)  Hypertension  (ICD-401.9) 17)  Asthma  (ICD-493.90)  Current Problems (verified): 1)  Acute Bronchitis  (ICD-466.0) 2)  Unspecified Otitis Media  (ICD-382.9) 3)  Hemoptysis Unspecified  (ICD-786.30) 4)  Shoulder Pain, Left  (ICD-719.41) 5)  Chest Pain, Atypical  (ICD-786.59) 6)  Hyperlipidemia  (ICD-272.4) 7)  Diabetes Mellitus, Type II  (ICD-250.00) 8)  Foot Pain  (ICD-729.5) 9)  Abdominal Pain, Chronic  (ICD-789.00) 10)  Back Pain  (ICD-724.5) 11)  Gerd  (ICD-530.81) 12)  Injury, Cervical Root  (ICD-953.0) 13)  Systolic Murmur  (ICD-785.2) 14)  Lymphadenopathy  (ICD-785.6) 15)  Deafness  (ICD-389.9) 16)  Hypertension   (ICD-401.9) 17)  Asthma  (ICD-493.90)  Medications Prior to Update: 1)  Bd Ultra-Fine Lancets  Misc (Lancets) .... To Test Blood Glucose Daily 2)  Freestyle Lite  Strp (Glucose Blood) .... To Test Blood Sugar One Time  Daily 3)  Prilosec 40 Mg  Cpdr (Omeprazole) .... Take 1 Tablet By Mouth Once A Day 4)  Metformin Hcl 1000 Mg Tabs (Metformin Hcl) .... Take 1 Tablet By Mouth Twice A Day 5)  Norvasc 5 Mg Tabs (Amlodipine Besylate) .... Take 1 Tablet By Mouth Once A Day 6)  Pravachol 40 Mg Tabs (Pravastatin Sodium) .... Take One Tablet Daily For Cholesterol. 7)  Lisinopril-Hydrochlorothiazide 20-12.5 Mg Tabs (Lisinopril-Hydrochlorothiazide) .... Take One Tablet Daily For Blood Pressure. 8)  Tussionex Pennkinetic Er 8-10 Mg/11ml Lqcr (Chlorpheniramine-Hydrocodone) .... Take 5 Ml Two Times A Day As Needed For Cough. 9)  Cefdinir 300 Mg Caps (Cefdinir) .... Take 1 Tablet By Mouth Two Times A Day 10)  Proair Hfa 108 (90 Base) Mcg/act Aers (Albuterol Sulfate) .... Take One Puff Twice A Day As Needed  Allergies (verified): 1)  ! Pcn 2)  ! Codeine 3)  ! 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 Dutch Quint Abnormal Pap smear- ASCUS  Ventricular septal defect per echo in 2004 Asthma per chart; no PFT's  Past Surgical History: Last updated: 03/08/2006 Cholecystectomy - 11/04/1993,  Tubal ligation - 10/05/1978 Colposcopy  Family History: Last updated: 2007/07/12 Mother died of liver cancer.  Social History: Last updated: 08/22/2006 Daughter, Randa Evens; Day care worker; Non-smoker.  Son incarcerated.  Assists with the care of her grandchild.    Risk Factors: Alcohol Use: 0 (08/06/2009) Exercise: yes (08/06/2009)  Risk Factors: Smoking Status: never (08/06/2009)  Review of Systems       per  HPI  Physical Exam  General:  alert and overweight-appearing.   Head:  normocephalic and atraumatic.   Ears:  External ear exam shows no significant lesions or deformities.  Otoscopic examination reveals clear canals, tympanic membranes are intact bilaterally without bulging, retraction, inflammation or discharge. Patient is deaf bilaterally. Nose:  no external deformity, no nasal discharge, and mucosal erythema.   Mouth:  thorat is red and infalmmed Lungs:  occassion wheeze. no rhonchi, no rales. good bilateral air entry.  Heart:  Normal rate and regular rhythm. S1 and S2 normal without gallop, murmur, click, rub or other extra sounds. Abdomen:  Bowel sounds positive,abdomen soft and non-tender without masses, organomegaly or hernias noted. Rectal:  No external abnormalities noted. Normal sphincter tone. No rectal masses or tenderness. Genitalia:  Vaginal canal pink and moist with decreased rugaetion. no CMT; mild TTP to right adnexa. No cervical or vaginal discharge or malordor. Msk:  No deformity or scoliosis noted of thoracic or lumbar spine.   Pulses:  2+ Extremities:  no edema Neurologic:  No cranial nerve deficits noted. Station and gait are normal. Plantar reflexes are down-going bilaterally. DTRs are symmetrical throughout. Sensory, motor and coordinative functions appear intact. Skin:  no suspicious lesions.   Cervical Nodes:  some anterior cervical tenderness bilaterally. Inguinal Nodes:  No significant adenopathy Psych:  Cognition and judgment appear intact. Alert and cooperative with normal attention span and concentration. No apparent delusions, illusions, hallucinations  Diabetes Management Exam:    Foot Exam (with socks and/or shoes not present):       Sensory-Monofilament:          Left foot: normal          Right foot: normal   Impression & Recommendations:  Problem # 1:  ABDOMINAL PAIN, CHRONIC (ICD-789.00) Right-sided pelvic pain. Ovarian cyst vs mass vs  appendicitis (unlikely). Naprosyn PRn for now. Instructed to call 911 or go to Ed if Sx worsen. Patient verbalized understanding. Will f/u after pelvic US. Orders: Ultrasound (Ultrasound) T-CBC No Diff ([id number])  Problem # 2:  HYPERLIPIDEMIA (ICD-272.4) High TG. 20 minutes discussion on diet, exercise. Advised to start swimming. Her updated medication list for this problem includes:    Pravachol 40 Mg Tabs (Pravastatin sodium) .Marland Kitchen... Take one tablet daily for cholesterol.    Gemfibrozil 600 Mg Tabs (Gemfibrozil) .Marland Kitchen... Take one tablet by mouth bid  Orders: T-Lipid Profile [id number])  Labs Reviewed: SGOT: 19 (01/28/2009)   SGPT: 20 (01/28/2009)   HDL:53 (01/28/2009), 58 (2008-07-11)  LDL:72 (01/28/2009), 122 (13/08/6576)  Chol:170 (01/28/2009), 218 (Jul 11, 2008)  Trig:223 (01/28/2009), 188 (07-11-08)  Problem # 3:  DIABETES MELLITUS, TYPE II (ICD-250.00)  Contorlled; diet, exercise, foot care and eye exam discussed.  Her updated medication list for this problem includes:    Metformin Hcl 1000 Mg Tabs (Metformin hcl) .Marland Kitchen... Take 1 tablet by mouth  twice a day    Lisinopril-hydrochlorothiazide 20-12.5 Mg Tabs (Lisinopril-hydrochlorothiazide) .Marland Kitchen... Take one tablet daily for blood pressure.    Diovan Hct 80-12.5 Mg Tabs (Valsartan-hydrochlorothiazide) .Marland Kitchen... Take 1 tablet by mouth once a day  Orders: T- Capillary Blood Glucose (63875) T-Hgb A1C (in-house) ([id number]) T-Urine Microalbumin w/creat. ratio [phone])  Labs Reviewed: Creat: 0.74 (01/28/2009)    Reviewed HgBA1c results: 7.3 (08/06/2009)  7.7 (01/27/2009)  Problem # 5:  HYPERTENSION (ICD-401.9)  The following medications were removed from the medication list:    Norvasc 5 Mg Tabs (Amlodipine besylate) .Marland Kitchen... Take 1 tablet by mouth once a day Her updated medication list for this problem includes:    Lisinopril-hydrochlorothiazide 20-12.5 Mg Tabs (Lisinopril-hydrochlorothiazide) .Marland Kitchen... Take one tablet daily for  blood pressure.    Diovan Hct 80-12.5 Mg Tabs (Valsartan-hydrochlorothiazide) .Marland Kitchen... Take 1 tablet by mouth once a day  BP today: 135/90 Prior BP: 126/83 (04/11/2009)  Labs Reviewed: K+: 3.7 (01/28/2009) Creat: : 0.74 (01/28/2009)   Chol: 170 (01/28/2009)   HDL: 53 (01/28/2009)   LDL: 72 (01/28/2009)   TG: 223 (01/28/2009)  Complete Medication List: 1)  Bd Ultra-fine Lancets Misc (Lancets) .... To test blood glucose daily 2)  Freestyle Lite Strp (Glucose blood) .... To test blood sugar one time  daily 3)  Prilosec 40 Mg Cpdr (Omeprazole) .... Take 1 tablet by mouth once a day 4)  Metformin Hcl 1000 Mg Tabs (Metformin hcl) .... Take 1 tablet by mouth twice a day 5)  Pravachol 40 Mg Tabs (Pravastatin sodium) .... Take one tablet daily for cholesterol. 6)  Lisinopril-hydrochlorothiazide 20-12.5 Mg Tabs (Lisinopril-hydrochlorothiazide) .... Take one tablet daily for blood pressure. 7)  Proair Hfa 108 (90 Base) Mcg/act Aers (Albuterol sulfate) .... Take one puff twice a day as needed 8)  Gemfibrozil 600 Mg Tabs (Gemfibrozil) .... Take one tablet by mouth bid 9)  Diovan Hct 80-12.5 Mg Tabs (Valsartan-hydrochlorothiazide) .... Take 1 tablet by mouth once a day  Other Orders: Tdap => 2yrs IM (10932) Admin 1st Vaccine (35573)  Patient Instructions: 1)  Please, note an addition of a medication. 2)  Our clinic will call you with an information for a pelvic US. 3)  Call with any questions. 4)  Please, follow up in 2 months or sooner. Prescriptions: PROAIR HFA 108 (90 BASE) MCG/ACT AERS (ALBUTEROL SULFATE) Take one puff twice a day as needed  #1 x 11   Entered and Authorized by:   Deatra Robinson MD   Signed by:   Deatra Robinson MD on 08/06/2009   Method used:   Electronically to        [facility] [address] [phone]* (retail)       [address]       [place], [place]  [address]       Ph: [phone] or [phone]       Fax: [phone]   RxID:    [id number] DIOVAN HCT 80-12.5 MG TABS (VALSARTAN-HYDROCHLOROTHIAZIDE) Take 1 tablet by mouth once a day  #30 x 11   Entered and Authorized by:   Deatra Robinson MD   Signed by:   Deatra Robinson MD on 08/06/2009   Method used:   [facility] to        [facility] [address] [phone]* (retail)       [address]       [place], [place]  [address]  Ph: [phone] or [phone]       Fax: [phone]   RxID:   [id number] GEMFIBROZIL 600 MG TABS (GEMFIBROZIL) Take one tablet by mouth bid  #60 x 0   Entered and Authorized by:   Deatra Robinson MD   Signed by:   Deatra Robinson MD on 08/06/2009   Method used:   [facility]        [facility] [address] [phone]* (retail)       [address]       [place], [place]  [address]       Ph: [phone] or [phone]       Fax: [phone]   RxID:   [id number]   Prevention & Chronic Care Immunizations   Influenza vaccine: Fluvax 3+  (10/03/2008)   Influenza vaccine due: 09/04/2009    Tetanus booster: 08/06/2009: Tdap   Tetanus booster due: 08/07/2019    Pneumococcal vaccine: Not documented  Colorectal Screening   Hemoccult: Done.  (08/04/1996)   Hemoccult action/deferral: Not indicated  (07/25/2008)    Colonoscopy: Results: Polyp.  Pathology:  Hyperplastic polyp.       (02/01/2008)   Colonoscopy action/deferral: Not indicated  (08/06/2009)   Colonoscopy due: 01/31/2013  Other Screening   Pap smear: NEGATIVE FOR INTRAEPITHELIAL LESIONS OR MALIGNANCY.  (06/19/2008)   Pap smear action/deferral: Deferred-3 yr interval  (08/06/2009)   Pap smear due: 07/25/2009    Mammogram: ASSESSMENT: Negative - BI-RADS 1^MM DIGITAL SCREENING  (07/01/2009)   Mammogram action/deferral: Deferred  (08/06/2009)   Mammogram due: 01/2010   Smoking status: never  (08/06/2009)  Diabetes Mellitus   HgbA1C: 7.3  (08/06/2009)   Hemoglobin A1C due: 02/06/2010    Eye exam: Not  documented   Diabetic eye exam action/deferral: Ophthalmology referral  (08/06/2009)    Foot exam: yes  (08/06/2009)   Foot exam action/deferral: Do today   High risk foot: No  (08/06/2009)   Foot care education: Done  (07/25/2008)   Foot exam due: 07/25/2009    Urine microalbumin/creatinine ratio: 46.0  (07/25/2008)   Urine microalbumin action/deferral: Ordered   Urine microalbumin/cr due: 07/25/2009    Diabetes flowsheet reviewed?: Yes   Progress toward A1C goal: Unchanged  Lipids   Total Cholesterol: 170  (01/28/2009)   Lipid panel action/deferral: Lipid Panel ordered   LDL: 72  (01/28/2009)   LDL Direct: Not documented   HDL: 53  (01/28/2009)   Triglycerides: 223  (01/28/2009)   Lipid panel due: 02/06/2010    SGOT (AST): 19  (01/28/2009)   BMP action: Ordered   SGPT (ALT): 20  (01/28/2009)   Alkaline phosphatase: 91  (01/28/2009)   Total bilirubin: 0.6  (01/28/2009)    Lipid flowsheet reviewed?: Yes   Progress toward LDL goal: Deteriorated  Hypertension   Last Blood Pressure: 135 / 90  (08/06/2009)   Serum creatinine: 0.74  (01/28/2009)   BMP action: Ordered   Serum potassium 3.7  (01/28/2009)    Hypertension flowsheet reviewed?: Yes   Progress toward BP goal: Deteriorated  Self-Management Support :   Personal Goals (by the next clinic visit) :     Personal A1C goal: 7  (10/03/2008)     Personal blood pressure goal: 130/80  (10/03/2008)     Personal LDL goal: 100  (10/03/2008)    Patient will work on the following items until the next clinic visit to reach self-care goals:     Medications and monitoring: take my medicines every  day, check my blood sugar, bring all of my medications to every visit, examine my feet every day  (08/06/2009)     Eating: drink diet soda or water instead of juice or soda, eat more vegetables, eat foods that are low in salt, eat fruit for snacks and desserts  (08/06/2009)     Activity: take a 30 minute walk every day  (08/06/2009)     Diabetes self-management support: Education handout, Resources for patients handout, Written self-care plan  (08/06/2009)   Diabetes care plan printed   Diabetes education handout printed   Last diabetes self-management training by diabetes educator: 12/11/2007   Last medical nutrition therapy: 12/11/2007    Hypertension self-management support: Education handout, Resources for patients handout, Written self-care plan  (08/06/2009)   Hypertension self-care plan printed.   Hypertension education handout printed    Lipid self-management support: Written self-care plan, Education handout, Pre-printed educational material, Resources for patients handout  (08/06/2009)   Lipid self-care plan printed.   Lipid education handout printed      Resource handout printed.   Nursing Instructions: Diabetic foot exam today Give tetanus booster today Refer for screening diabetic eye exam (see order)    Laboratory Results   Blood Tests   Date/Time Received: August 06, 2009 9:18 AM  Date/Time Reported: Burke Keels  August 06, 2009 9:18 AM   HGBA1C: 7.3%   (Normal Range: Non-Diabetic - 3-6%   Control Diabetic - 6-8%) CBG Random:: 129mg /dL      Immunizations Administered:  Tetanus Vaccine:    Vaccine Type: Tdap    Site: left deltoid    Mfr: [employer]    Dose: 0.5 ml    Route: IM    Given by: Angelina Ok RN    Exp. Date: 07/04/2011    Lot #: [id number]    VIS given: 11/22/06 version given August 06, 2009.  Process Orders Check Orders Results:     [facility]: ABN not required for this insurance Tests Sent for requisitioning (August 06, 2009 11:32 AM):     08/06/2009: [facility] -- T-Urine Microalbumin w/creat. ratio [[id number]] (signed)     08/06/2009: [facility] -- T-Lipid Profile [phone] (signed)     08/06/2009: [facility] -- T-CBC No Diff [[id number]] (signed)

## 2010-02-03 NOTE — Assessment & Plan Note (Signed)
Summary: interpreter schedule w/Janice Massey 1hr. [mkj]   Vital Signs:  Patient profile:   55 year old female Height:      68 inches (172.72 cm) Weight:      202.4 pounds (92 kg) BMI:     30.89 Temp:     98.3 degrees F (36.83 degrees C) oral Pulse rate:   81 / minute BP sitting:   153 / 86  (right arm)  Vitals Entered By: Stanton Kidney Ditzler RN (January 27, 2009 4:25 PM) Is Patient Diabetic? Yes Did you bring your meter with you today? No Pain Assessment Patient in pain? yes     Location: throat Intensity: 7 Type: painful Onset of pain  past 2 weeks Nutritional Status BMI of > 30 = obese Nutritional Status Detail appetite down CBG Result 164  Have you ever been in a relationship where you felt threatened, hurt or afraid?denies   Does patient need assistance? Functional Status Self care Ambulation Normal Comments Past 2 weeks chest congestion and coughing up blood and occ h/a.   Primary Care Provider:  Manning Charity MD   History of Present Illness: This is a  year old woman with past medical history of   Diabetes Recurrent PID Hypertension Back pain C5/C6 nerve encroachment for which Janice Massey has been evaluated by Dr. 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  Janice Massey is here for DM check up and with complaint of cough with blood for 2 weeks.  Cough is painful, no pain with breathing.  Productive of thick sputum and streaks of blood.  No trouble swallowing.  Throat feels very dry.  Had subjective fevers and chills earlier this week.  Has been around two granddaughters who have also been sick.  Janice Massey had a nose bleed two days ago.  Janice Massey does not have shortness of breath, appetite has been intact and energy level decreased because Janice Massey can not sleep due to cough.  Depression History:      The patient denies a depressed mood most of the day and a diminished interest in her usual daily  activities.         Preventive Screening-Counseling & Management  Alcohol-Tobacco     Alcohol drinks/day: 0     Smoking Status: never  Caffeine-Diet-Exercise     Does Patient Exercise: yes     Type of exercise: walking     Times/week: 4  Current Medications (verified): 1)  Bd Ultra-Fine Lancets  Misc (Lancets) .... To Test Blood Glucose Daily 2)  Freestyle Lite  Strp (Glucose Blood) .... To Test Blood Sugar One Time  Daily 3)  Prilosec 40 Mg  Cpdr (Omeprazole) .... Take 1 Tablet By Mouth Once A Day 4)  Metformin Hcl 1000 Mg Tabs (Metformin Hcl) .... Take 1 Tablet By Mouth Twice A Day 5)  Norvasc 5 Mg Tabs (Amlodipine Besylate) .... Take 1 Tablet By Mouth Once A Day 6)  Pravachol 40 Mg Tabs (Pravastatin Sodium) .... Take One Tablet Daily For Cholesterol.  Allergies: 1)  ! Pcn 2)  ! Codeine 3)  ! Bc Headache Powder  Review of Systems       per hpi  Physical Exam  General:  alert and overweight-appearing.   Head:  normocephalic and atraumatic.   Eyes:  vision grossly intact, pupils equal, pupils round, and pupils reactive to light.   Ears:  R ear normal and L  ear normal.  TM's clear. Nose:  no external deformity, no nasal discharge, and mucosal erythema.   Mouth:  pharynx pink and moist, no erythema, no exudates, no posterior lymphoid hypertrophy, and no postnasal drip.   Lungs:  normal respiratory effort and normal breath sounds.   Heart:  normal rate, regular rhythm, and Grade   4/6 systolic ejection murmur.   Pulses:  2+ Extremities:  no edema Neurologic:  alert & oriented X3 and cranial nerves II-XII intact (except 8).  strength normal in all extremities, and gait normal.   Cervical Nodes:  some anterior cervical tenderness bilaterally. Psych:  Oriented X3, memory intact for recent and remote, normally interactive, good eye contact, and slightly anxious.     Impression & Recommendations:  Problem # 1:  HEMOPTYSIS UNSPECIFIED (ICD-786.30) Most likley bronchitis.   Janice Massey is very low risk for malignancy as a never smoker with no family history.  Janice Massey has minimal risk of tb.  Will get cxr today as well as coags and CBC with diff.  Will treat cough with tussionex to help her rest. (discussed with dr. Phillips Odor)  Orders: CXR- 2view (CXR) T-CBC w/Diff ([id number]) T-Protime (in-house) [phone])  Problem # 2:  DIABETES MELLITUS, TYPE II (ICD-250.00) a1c is up to 7.7 from 6.4 Janice Massey has not been taking metformin since october because Janice Massey thought that there were no more refills on it. restart metformin recheck in 3 months.  The following medications were removed from the medication list:    Lisinopril-hydrochlorothiazide 20-25 Mg Tabs (Lisinopril-hydrochlorothiazide) .Marland Kitchen... Take one tablet daily for blood pressure. Her updated medication list for this problem includes:    Metformin Hcl 1000 Mg Tabs (Metformin hcl) .Marland Kitchen... Take 1 tablet by mouth twice a day    Lisinopril-hydrochlorothiazide 20-12.5 Mg Tabs (Lisinopril-hydrochlorothiazide) .Marland Kitchen... Take one tablet daily for blood pressure.  Orders: T- Capillary Blood Glucose ([id number]) T-Hgb A1C (in-house) ([id number])  Problem # 3:  HYPERTENSION (ICD-401.9) BP above goal. never started the lisinopril hctz. will start today. check BMET today as baseline.  The following medications were removed from the medication list:    Lisinopril-hydrochlorothiazide 20-25 Mg Tabs (Lisinopril-hydrochlorothiazide) .Marland Kitchen... Take one tablet daily for blood pressure. Her updated medication list for this problem includes:    Norvasc 5 Mg Tabs (Amlodipine besylate) .Marland Kitchen... Take 1 tablet by mouth once a day    Lisinopril-hydrochlorothiazide 20-12.5 Mg Tabs (Lisinopril-hydrochlorothiazide) .Marland Kitchen... Take one tablet daily for blood pressure.  BP today: 153/86 Prior BP: 132/81 (10/03/2008)  Labs Reviewed: K+: 4.4 (07/10/2008) Creat: : 0.77 (07/10/2008)   Chol: 218 (06/19/2008)   HDL: 58 (06/19/2008)   LDL: 122 (06/19/2008)   TG: 188  (06/19/2008)  Problem # 4:  HYPERLIPIDEMIA (ICD-272.4) last lipids show LDL above goal.   will get lipids and cmet today to monitor progress.  Her updated medication list for this problem includes:    Pravachol 40 Mg Tabs (Pravastatin sodium) .Marland Kitchen... Take one tablet daily for cholesterol.  Labs Reviewed: SGOT: 15 (06/23/2006)   SGPT: 16 (06/23/2006)   HDL:58 (06/19/2008), 59 (12/14/2007)  LDL:122 (06/19/2008), 120 (12/14/2007)  Chol:218 (06/19/2008), 196 (12/14/2007)  Trig:188 (06/19/2008), 83 (12/14/2007)  Orders: T-Comprehensive Metabolic Panel ([id number]) T-Lipid Profile ([id number])  Complete Medication List: 1)  Bd Ultra-fine Lancets Misc (Lancets) .... To test blood glucose daily 2)  Freestyle Lite Strp (Glucose blood) .... To test blood sugar one time  daily 3)  Prilosec 40 Mg Cpdr (Omeprazole) .... Take 1 tablet by mouth once a day 4)  Metformin Hcl 1000 Mg Tabs (  Metformin hcl) .... Take 1 tablet by mouth twice a day 5)  Norvasc 5 Mg Tabs (Amlodipine besylate) .... Take 1 tablet by mouth once a day 6)  Pravachol 40 Mg Tabs (Pravastatin sodium) .... Take one tablet daily for cholesterol. 7)  Lisinopril-hydrochlorothiazide 20-12.5 Mg Tabs (Lisinopril-hydrochlorothiazide) .... Take one tablet daily for blood pressure. 8)  Tussionex Pennkinetic Er 8-10 Mg/72ml Lqcr (Chlorpheniramine-hydrocodone) .... Take 5 ml two times a day as needed for cough.  Patient Instructions: 1)  You have new prescriptions at the pharmacy. 2)  You have a cough medicine for your cough. 3)  You have more metformin to control blood sugars. 4)  You have lisinopril-hctz for blood pressure. 5)  You should use a saline nasal spray for dry nose and nose bleed 4-5 times a day. 6)  You will have a chest xray for cough today. 7)  You will need to have labwork today. Prescriptions: METFORMIN HCL 1000 MG TABS (METFORMIN HCL) Take 1 tablet by mouth twice a day  #62 Tablet x 11   Entered and Authorized by:    Elby Showers MD   Signed by:   Elby Showers MD on 01/27/2009   Method used:   Electronically to        [facility] [address] [phone]* (retail)       [address]       [place], [place]  [address]       Ph: [phone] or [phone]       Fax: [phone]   RxID:   [id number] Sandria Senter ER 8-10 MG/5ML LQCR (CHLORPHENIRAMINE-HYDROCODONE) Take 5 ml two times a day as needed for cough.  #60 ml x 0   Entered and Authorized by:   Elby Showers MD   Signed by:   Elby Showers MD on 01/27/2009   Method used:   Print then Give to Patient   RxID:   [id number] LISINOPRIL-HYDROCHLOROTHIAZIDE 20-12.5 MG TABS (LISINOPRIL-HYDROCHLOROTHIAZIDE) Take one tablet daily for blood pressure.  #32 x 3   Entered and Authorized by:   Elby Showers MD   Signed by:   Elby Showers MD on 01/27/2009   Method used:   [facility]        [facility] [address] [phone]* (retail)       [address]       [place], [place]  [address]       Ph: [phone] or [phone]       Fax: [phone]   RxID:   [id number]  Process Orders Check Orders Results:     [facility]: ABN not required for this insurance Tests Sent for requisitioning (January 27, 2009 7:31 PM):     01/27/2009: [facility] -- T-Comprehensive Metabolic Panel [[id number]] (signed)     01/27/2009: [facility] -- T-Lipid Profile [id number] (signed)     01/27/2009: [facility] -- [facility] w/Diff [[id number]] (signed)    Prevention & Chronic Care Immunizations   Influenza vaccine: Fluvax 3+  (10/03/2008)    Tetanus booster: 11/04/1997: Done.    Pneumococcal vaccine: Not documented  Colorectal Screening   Hemoccult: Done.  (08/04/1996)   Hemoccult action/deferral: Not indicated  (07/25/2008)    Colonoscopy: Results: Polyp.  Pathology:  Hyperplastic polyp.       (02/01/2008)   Colonoscopy  action/deferral: Repeat colonoscopy in 10 years.    (02/01/2008)   Colonoscopy due: 01/31/2013  Other Screening   Pap smear: NEGATIVE FOR INTRAEPITHELIAL LESIONS OR MALIGNANCY.  (06/19/2008)   Pap smear due: 07/25/2009    Mammogram: BI-RADS CATEGORY 1:  Negative.^MM DIGITAL DIAGNOSTIC BILAT  (07/01/2008)   Mammogram due: 07/02/2010   Smoking status: never  (01/27/2009)  Diabetes Mellitus   HgbA1C: 7.7  (01/27/2009)    Eye exam: Not documented   Diabetic eye exam action/deferral: Ophthalmology referral  (07/25/2008)    Foot exam: yes  (10/03/2008)   Foot exam action/deferral: Do today   High risk foot: No  (07/25/2008)   Foot care education: Done  (07/25/2008)   Foot exam due: 07/25/2009    Urine microalbumin/creatinine ratio: 46.0  (07/25/2008)   Urine microalbumin action/deferral: Ordered    Diabetes flowsheet reviewed?: Yes   Progress toward A1C goal: Deteriorated  Lipids   Total Cholesterol: 218  (06/19/2008)   Lipid panel action/deferral: Lipid Panel ordered   LDL: 122  (06/19/2008)   LDL Direct: Not documented   HDL: 58  (06/19/2008)   Triglycerides: 188  (06/19/2008)    SGOT (AST): 15  (06/23/2006)   BMP action: Ordered   SGPT (ALT): 16  (06/23/2006) CMP ordered    Alkaline phosphatase: 86  (06/23/2006)   Total bilirubin: 0.6  (06/23/2006)  Hypertension   Last Blood Pressure: 153 / 86  (01/27/2009)   Serum creatinine: 0.77  (07/10/2008)   BMP action: Ordered   Serum potassium 4.4  (07/10/2008) CMP ordered     Hypertension flowsheet reviewed?: Yes   Progress toward BP goal: Deteriorated  Self-Management Support :   Personal Goals (by the next clinic visit) :     Personal A1C goal: 7  (10/03/2008)     Personal blood pressure goal: 130/80  (10/03/2008)     Personal LDL goal: 100  (10/03/2008)    Patient will work on the following items until the next clinic visit to reach self-care goals:     Medications and monitoring: take my medicines every day,  bring all of my medications to every visit, examine my feet every day  (01/27/2009)     Eating: drink diet soda or water instead of juice or soda, eat more vegetables, use fresh or frozen vegetables, eat foods that are low in salt, eat baked foods instead of fried foods, eat fruit for snacks and desserts  (01/27/2009)     Activity: take a 30 minute walk every day, park at the far end of the parking lot  (01/27/2009)    Diabetes self-management support: Written self-care plan  (10/03/2008)   Last diabetes self-management training by diabetes educator: 12/11/2007   Last medical nutrition therapy: 12/11/2007    Hypertension self-management support: Written self-care plan  (10/03/2008)    Lipid self-management support: Written self-care plan  (10/03/2008)    Laboratory Results   Blood Tests   Date/Time Received: January 27, 2009 4:36 PM Date/Time Reported: Alric Quan  January 27, 2009 4:36 PM  HGBA1C: 7.7%   (Normal Range: Non-Diabetic - 3-6%   Control Diabetic - 6-8%) CBG Random:: 164mg /dL

## 2010-02-03 NOTE — Assessment & Plan Note (Signed)
Summary: pain under L breat/[place]   Vital Signs:  Patient profile:   55 year old female Height:      64 inches Weight:      174.6 pounds BMI:     30.08 Temp:     97.3 degrees F oral Pulse rate:   71 / minute BP sitting:   102 / 69  (right arm) Cuff size:   regular  Vitals Entered By: Garen Grams LPN (April 16, 2009 2:34 PM) CC: sharp pains in L breast Is Patient Diabetic? No   Primary Care Provider:  Milinda Antis MD  CC:  sharp pains in L breast.  History of Present Illness: 1. Pain in Left breast:  Pt has noticed a sharp pain in her left breast since yesterday.  She does not remember doing anything that started the pain and there was no injury to the area.  She has not had pain like this before.  Described as a sharp pain that comes and goes.  Lasts for 5 minutes and then self resolves.   Rated a 6/10 when its there and 0/10 when it resolves.      ROS: denies chest pain, shortness of breath, diaphoresis, n/v.                 Denies breast mass, nipple discharge or skin changes.  Denies weight loss or night sweats.               Denies pleuritic chest pain or pain with inspiration               Denies pain with certain movements  Habits & Providers  Alcohol-Tobacco-Diet     Tobacco Status: never  Current Medications (verified): 1)  Senokot 8.6 Mg Tabs (Sennosides) .... 2 Tabs By Mouth Two Times A Day As Needed Constipation  Allergies: No Known Drug Allergies  Past History:  Past Medical History: Reviewed history from 01/13/2009 and no changes required. Partial SBO- Dec 29, 2008  Social History: Reviewed history from 03/08/2009 and no changes required. Lives in [place] works in [employer].  [facility].  No ETOH/ Tobacco Patient reports never has smoked.  Alcohol occasionally, no recreational drugs (03/08/2009)  Physical Exam  General:  NAD, Vital signs noted  Neck:  no lympadenopathy or masses Breasts:  Large, Pendulant breasts  bilaterally.  No mass, nodules, thickening, tenderness, bulging, retraction, inflamation, nipple discharge or skin changes noted.   Lungs:  Normal respiratory effort, chest expands symmetrically. Lungs are clear to auscultation, no crackles or wheezes. Heart:  normal rate, regular rhythm, no murmur, no gallop, and no rub.   Abdomen:  Normoactive BS, soft NT/ND  Msk:  Left breast at 12:00 TTP.  Full ROM of left shoulder.  No pain with abduction or adduction.  No pain with active pushing away.   Impression & Recommendations:  Problem # 1:  BREAST PAIN, LEFT (ICD-611.71) Assessment New  Unsure of cause.  ? MSK pain (pectoral, rib).  Not concerning for cardiac pain.   No masses or suspcious findings on breast exam.  Will treat with anti-inflammatories for the next 2 weeks.  If pain persists would send to [facility] for evaluation.  Discussed with Dr. Sheffield Slider.  Orders: [facility]- Est Level  3 ([id number])  Complete Medication List: 1)  Senokot 8.6 Mg Tabs (Sennosides) .... 2 tabs by mouth two times a day as needed constipation  Patient Instructions: 1)  I am not sure what is causing your  breast pain 2)  I think that it may be coming from your muscle underneath your breast 3)  Try taking Ibuprofen at home up to 600 mg every 8 hours 4)  Do that for the next 2 weeks 5)  If it is still not better by then, we will send you to the Breast Center for evaluation 6)  Please schedule a follow up appointment in 2 weeks  Prevention & Chronic Care Immunizations   Influenza vaccine: Fluvax 3+  (10/16/2008)    Tetanus booster: Not documented    Pneumococcal vaccine: Not documented  Colorectal Screening   Hemoccult: Not documented    Colonoscopy: Not documented  Other Screening   Pap smear: normal  (12/27/2006)   Pap smear due: 01/2008    Mammogram: ASSESSMENT: Negative - BI-RADS 1^MM DIGITAL SCREENING  (01/23/2009)   Smoking status: never  (04/16/2009)  Lipids   Total Cholesterol: 174   (06/21/2006)   LDL: 98  (06/21/2006)   LDL Direct: Not documented   HDL: 40  (06/21/2006)   Triglycerides: 180  (06/21/2006)    SGOT (AST): 13  (06/21/2006)   SGPT (ALT): 16  (06/21/2006)   Alkaline phosphatase: 61  (06/21/2006)   Total bilirubin: 0.6  (06/21/2006)  Self-Management Support :    Lipid self-management support: Not documented

## 2010-02-03 NOTE — Assessment & Plan Note (Signed)
Summary: FU/KH   Vital Signs:  Patient profile:   55 year old female Height:      64 inches Weight:      182 pounds BMI:     31.35 Temp:     97.6 degrees F oral Pulse rate:   60 / minute BP sitting:   148 / 95  (left arm) Cuff size:   regular  Vitals Entered By: Tessie Fass CMA (February 06, 2009 4:25 PM)  Serial Vital Signs/Assessments:  Time      Position  BP       Pulse  Resp  Temp     By                     130/86                         Milinda Antis MD  CC: F/U constipation Is Patient Diabetic? No Pain Assessment Patient in pain? no        Primary Care Provider:  Milinda Antis MD  CC:  F/U constipation.  History of Present Illness:   Pt here for hospital follow-up admitted Dec 26-30 2010 for partial SBO, which resolved spontaneoulsy.  Currently has bowel movements every other day, still feels like she is straining. Denies any blood in the stool. +Flatus, last week took a laxative, did not do Mialax regimine.  Occasionally has abodminal cramping associated with nausea.  Has not increased the fiber in her diet ROS- denies fever,  no dysuria, , no tarry stools   Habits & Providers  Alcohol-Tobacco-Diet     Tobacco Status: quit  Current Medications (verified): 1)  Dulcolax Milk of Magnesia 400 Mg/40ml Susp (Magnesium Hydroxide) .Marland Kitchen.. 1 Suppository Per Rectum As Needed 2)  Miralax  Powd (Polyethylene Glycol 3350) .Marland Kitchen.. 1 Capfull Two Times A Day X 7 Days Stop For Loose Stools  Allergies (verified): No Known Drug Allergies  Social History: Smoking Status:  quit  Review of Systems       Per HPI  Physical Exam  General:  NAD, Vital signs noted Rechecked Blood pressure Heart:  RRR, no murmur Abdomen:  +BS, soft, normal-active, no distention, non-tender  patient with scars from hernia repair and cholecystectomy. No masses, no palpable stool   Impression & Recommendations:  Problem # 1:  CONSTIPATION (ICD-564.00) Assessment Improved Some  improvement as pt having BM, although not very regular as prior to PSBO, encouraged fluids and fiber Her updated medication list for this problem includes:    Dulcolax Milk of Magnesia 400 Mg/67ml Susp (Magnesium hydroxide) .Marland Kitchen... 1 suppository per rectum as needed    Miralax Powd (Polyethylene glycol 3350) .Marland Kitchen... 1 capfull two times a day x 7 days stop for loose stools  Orders: [facility]- Est Level  3 (95621)  Complete Medication List: 1)  Dulcolax Milk of Magnesia 400 Mg/74ml Susp (Magnesium hydroxide) .Marland Kitchen.. 1 suppository per rectum as needed 2)  Miralax Powd (Polyethylene glycol 3350) .Marland Kitchen.. 1 capfull two times a day x 7 days stop for loose stools  Patient Instructions: 1)  Increase the fiber, you can do this vegetables, or take Metamucil and mix in water 2)  Only use laxatives as needed 3)  Try the Miralax if no bowel movement in 3 days 4)  Drink plenty of water.  5)  Follow up in 3 months for annual exam  Appended Document:   R3 Admit Addendum  PCP: [facility] CC: abd pain HPI:  For full summary, please see complete R1 note.  In short, pt is a 54yo AAF with h/o constipation who presents with 1d h/o progressively worsening abd pain.  States prior to evaluation, had noted difficulty with stools.  Abd pain characterized as sharp crampy abd pain worse in LLQ, radiating to epigastric region.  States has not been able to eat anything for 24 hours.  States did take miralax and over last few hours has started to pass stools and flatus.  Endorses blood tinged stool, no melena or hematochezia.  Denies h/o hemorrhoids.  Similar admission 12/2008 with partial SBO that resolved with conservative management.  Evaluated at [facility] and transfered to ER.  In ER given morphine 4mg  IV x 2 and zofran 4mg  IV.  Also given 250cc bolus.  Denies fevers, chills, nausea, vomiting, diarrhea.  Endorses chest pain, but characterizes as sharp and stemming from abdominal pain, not associated with exertion, not relieved by rest.  Denies  dysuria, urgency, frequency.  For PMHx, SHx, All, Meds, please see complete R1 note.  PE: 98.3   54-76     15     110-125/60s     98% RA Gen: in mild discomfort, A&Ox3 HEENT: PERRL, EOMI, sl dry MM, no pharyngeal erythema/edema CVS: normal S1, S2, no m/r/g Pulm: CTAB, no c/w Abd: soft, diffusely tender to palpation throughout, no rebound or guarding, no CVA tenderness. Ext: no c/c/e Skin: no rash/jaundice  Labs: WBC 11.3, Hgb 12.9 Na 138, K 3.2, HCO3 24, glu 116, TBili 1.5, LFTs WNL, Cr 0.64 UA spgr 1.034, small bili, mod blood, 30 protein, large LE, neg nitr micro: many epi and bacteria, TNTC WBC, 7-10 RBC  Imaging:  Acute abd series - distended small bowel loops Abd CT - few scattered fluid filled prominent small bowel loops, distal loops smaller in size, ? chronic partial SBO.  A/P: 55 yo with rpt small bowel obstruction that seems to be resolving 1. SBO - admit mainly for pain control with IV morphine, follow conservatively.  Consider surgical consult in AM.  May place NGT in am if not improving as desired (pt currently prefers not to have and states passing gas and BMs).  Keep NPO and continue IVF hydration at [facility].  Check hemoccult as pt states noted blood tinged stool. 2. UA with concern for infection - currently asymptomatic, clean catch with many epithelial cells.  UCx pending, but will hold off on treatment given no sxs and likely contaminated specimen.  Will not recheck as no sxs. 3. FENGI - NPO, replete K 4. ppx - heparin, ppi 5. Dispo - pending improvement of #1.  Eustaquio Boyden  MD  March 08, 2009 4:30 AM

## 2010-02-03 NOTE — Assessment & Plan Note (Signed)
Summary: hfu,df   Vital Signs:  Patient profile:   55 year old female Height:      64 inches Weight:      176 pounds BMI:     30.32 Temp:     98.2 degrees F oral Pulse rate:   76 / minute BP sitting:   114 / 77  (right arm) Cuff size:   large  Vitals Entered By: Tessie Fass CMA (March 25, 2009 1:40 PM) CC: hospital f/u Is Patient Diabetic? No Pain Assessment Patient in pain? no        Primary Care Provider:  Milinda Antis MD  CC:  hospital f/u.  History of Present Illness:   hospital follow-up for partial SBO Pt hospiltilizated for recurrent SBO, sugery Dr. Daphine Deutscher consulted but pt progressed without surgical intervention. BM now regular with Senna-Kot, trying to keep hydrated. Denies abdominal pain, N/V, fever, loose stools  Habits & Providers  Alcohol-Tobacco-Diet     Tobacco Status: never  Current Medications (verified): 1)  Senokot 8.6 Mg Tabs (Sennosides) .... 2 Tabs By Mouth Two Times A Day As Needed Constipation  Allergies (verified): No Known Drug Allergies  Social History: Smoking Status:  never  Review of Systems       Per HPI  Physical Exam  General:  NAD, Vital signs noted  Abdomen:  Normalactive BS, soft NT/ND    Impression & Recommendations:  Problem # 1:  SMALL BOWEL OBSTRUCTION (ICD-560.9) Assessment Improved  Resolved recurrent SBO, will continue to monitor. If reoccurs will need surgical intervention for Lysis of Adhesiosn from TAH  Orders: [facility]- Est Level  3 (16109)  Problem # 2:  CONSTIPATION (ICD-564.00) Assessment: Improved  Continue Senna prn The following medications were removed from the medication list:    Miralax Powd (Polyethylene glycol 3350) .Marland Kitchen... 1 capfull two times a day x 7 days stop for loose stools Her updated medication list for this problem includes:    Senokot 8.6 Mg Tabs (Sennosides) .Marland Kitchen... 2 tabs by mouth two times a day as needed constipation  Orders: [facility]- Est Level  3 (60454)  Complete  Medication List: 1)  Senokot 8.6 Mg Tabs (Sennosides) .... 2 tabs by mouth two times a day as needed constipation  Patient Instructions: 1)  Please schedule your well women exam for the next 1-2 months 2)  Do not eat after midnight when you come so we can check your cholesterol 3)  If you have any abdominal pain or diarrhea please let me know  4)  You only need the senna if you feel constipated   Appended Document: hfu,df Hospital labs inputed

## 2010-02-03 NOTE — Assessment & Plan Note (Signed)
Summary: hfu,df   Vital Signs:  Patient profile:   55 year old female Height:      64 inches Weight:      183 pounds BMI:     31.53 Temp:     97.8 degrees F oral Pulse rate:   57 / minute BP sitting:   117 / 82  (left arm) Cuff size:   regular  Vitals Entered By: Tessie Fass CMA (January 13, 2009 10:58 AM) CC: Hospital F/U- Constipation Is Patient Diabetic? No Pain Assessment Patient in pain? no        Primary Care Provider:  Milinda Antis MD  CC:  Hospital F/U- Constipation.  History of Present Illness:   Pt here for hospital follow-up admitted Dec 26-30 2010 for partial SBO, which resolved spontaneoulsy. Today complains of inability to have BM as normal. Last week took a laxative to have BM, which helped, over the weekend has been very constipated. Last pm took a suppository to have BM , which helped some. ROS- denies fever, occasional abdominal cramping, no n/v, no dysuria, had small amount of blood after straining yesterday, no tarry stools   Habits & Providers  Alcohol-Tobacco-Diet     Tobacco Status: never  Current Medications (verified): 1)  Dulcolax Milk of Magnesia 400 Mg/70ml Susp (Magnesium Hydroxide) .Marland Kitchen.. 1 Suppository Per Rectum As Needed 2)  Miralax  Powd (Polyethylene Glycol 3350) .Marland Kitchen.. 1 Capfull Two Times A Day X 7 Days Stop For Loose Stools  Allergies (verified): No Known Drug Allergies  Past History:  Past Medical History: Partial SBO- Dec 29, 2008  Physical Exam  General:  NAD, Vital signs noted  Mouth:  MMM Heart:  RRR, no murmur Abdomen:  +BS, normal-active, no distention, non-tender  patient with scars from hernia repair and cholecystectomy. No masses   Impression & Recommendations:  Problem # 1:  CONSTIPATION (ICD-564.00) Assessment New  Will start bowel regimine, likley from SBO and use of pain meds in hospital. Hold on KUB at this point. RTC in 2 weeks. No red flags Her updated medication list for this problem includes:  Dulcolax Milk of Magnesia 400 Mg/79ml Susp (Magnesium hydroxide) .Marland Kitchen... 1 suppository per rectum as needed    Miralax Powd (Polyethylene glycol 3350) .Marland Kitchen... 1 capfull two times a day x 7 days stop for loose stools  Orders: [facility]- Est Level  3 (24401)  Complete Medication List: 1)  Dulcolax Milk of Magnesia 400 Mg/81ml Susp (Magnesium hydroxide) .Marland Kitchen.. 1 suppository per rectum as needed 2)  Miralax Powd (Polyethylene glycol 3350) .Marland Kitchen.. 1 capfull two times a day x 7 days stop for loose stools  Patient Instructions: 1)  Increase your water and fluids 2)  Increase your fiber intake 3)  Take the Miralax as prescribed 4)  Use the suppository 5)  Return in 2 weeks 6)  If unable to have a bowel movement at least 1 a day then call me before hand 7)  If you have increase blood in the stool then call me  Prescriptions: MIRALAX  POWD (POLYETHYLENE GLYCOL 3350) 1 capfull two times a day x 7 days Stop for loose stools  #1 x 0   Entered and Authorized by:   Milinda Antis MD   Signed by:   Milinda Antis MD on 01/13/2009   Method used:   Print then Give to Patient   RxID:   [id number] DULCOLAX MILK OF MAGNESIA 400 MG/5ML SUSP (MAGNESIUM HYDROXIDE) 1 suppository per rectum as needed  #10 x 1  Entered and Authorized by:   Milinda Antis MD   Signed by:   Milinda Antis MD on 01/13/2009   Method used:   Print then Give to Patient   RxID:   [id number]    Past Medical History:    Partial SBO- Dec 29, 2008

## 2010-02-03 NOTE — Assessment & Plan Note (Signed)
Summary: CPE WITH PAP/KH   Vital Signs:  Patient profile:   55 year old female Height:      64 inches Weight:      174 pounds BMI:     29.97 Temp:     97.9 degrees F oral Pulse rate:   105 / minute BP sitting:   107 / 76  (left arm) Cuff size:   regular  Vitals Entered By: Tessie Fass CMA (August 19, 2009 3:00 PM) CC: CPP Pain Assessment Patient in pain? yes     Location: abdomen Intensity: 5   Primary Care Provider:  Milinda Antis MD  CC:  CPP.  History of Present Illness:   Last week abdominal pain "turning feeling", occ  nausea, unable to have a BM. That night had a BM and since then pain has improved.  Occ has burining sensation with sour taste in mouth associated BM have been irregular, now BM twice a week, eating more fiber, no blood stools, no distension  Prior to SBO, had BM daily Tolerating by mouth  ROS- no fever, no dysuria,  +vaginal discharge daily- white discharge or odor  no sexual activity in > 6 months  Habits & Providers  Alcohol-Tobacco-Diet     Tobacco Status: never  Current Medications (verified): 1)  Senokot 8.6 Mg Tabs (Sennosides) .... 2 Tabs By Mouth Two Times A Day As Needed Constipation 2)  Prilosec 20 Mg Cpdr (Omeprazole) .Marland Kitchen.. 1 By Mouth Daily As Needed Heartburn  Allergies (verified): No Known Drug Allergies  Past History:  Past Surgical History: Last updated: 12/30/2008 Abdominal hernia repair 2007 Hysterectomy - unknown indication 2003 cholecystectomy  Physical Exam  General:  NAD, Vital signs noted  Mouth:  MMM Neck:  no thyromegaly.   Breasts:  Large, breasts bilaterally.  No mass, nodules, thickening, tenderness, bulging, retraction, inflamation, nipple discharge or skin changes noted.   Lungs:  Normal respiratory effort, chest expands symmetrically. Lungs are clear to auscultation, no crackles or wheezes. Heart:  normal rate, regular rhythm, no murmur, no gallop, and no rub.   Abdomen:  Normoactive BS, soft  NT/ND  Rectal:  no external abnormalities.   Genitalia:  normal introitus, no external lesions, mucosa pink and moist, and no vaginal atrophy. Tissues visualized that appears to be end of cervix, os appears stenosed Large amount of yellow discharge.     Impression & Recommendations:  Problem # 1:  PHYSICAL EXAMINATION (ICD-V70.0) Assessment New  Orders:PAP SMEAR Done, based on pathology if cervical cells seen will continue routine screening, if not send for U/S to identify if inflammaed mucousa vs cervix, no abnormal vaginal bleeding Pap Smear-[facility] ([address]-62130)  Problem # 2:  VAGINITIS (ICD-616.10) Assessment: New Wet prep negative however with very thick abnormal discharge. Discussed avoid douching/body washes Will await other labs, pt tender on exam, history of BTL. pending labs will likley treat with course of flagyl for symtoms and abdundant WBC [facility] GC/Chlamydia-[facility] (87591/87491) [facility] - Est  40-64 yrs (46962)  Problem # 3:  HEARTBURN (ICD-787.1) Assessment: New  upper GI symptoms consistent with GERD, trial of PPI  Orders: [facility] - Est  40-64 yrs (95284)  Problem # 4:  CONSTIPATION (ICD-564.00) Assessment: Deteriorated  Reviewed proper fiber routine, fluids, no evidence of obstruction on exam today Her updated medication list for this problem includes:    Senokot 8.6 Mg Tabs (Sennosides) .Marland Kitchen... 2 tabs by mouth two times a day as needed constipation  Orders: [facility] - Est  40-64 yrs (  38182)  Complete Medication List: 1)  Senokot 8.6 Mg Tabs (Sennosides) .... 2 tabs by mouth two times a day as needed constipation 2)  Prilosec 20 Mg Cpdr (Omeprazole) .Marland Kitchen.. 1 by mouth daily as needed heartburn  Other Orders: Future Orders: Lipid-[facility] ([id number]) ... 08/14/2010 Basic Met-[facility] (93810-17510) ... 08/07/2010  Patient Instructions: 1)  I will call you with results of your other test from today 2)  Follow-up to have your labs done, do not eat anything  after midnight 3)  For your stomach start the prilosec daily 4)  Take Miralax as needed  for easy bowel movements and drink plenty of fluid 5)  Return to see me in 1 month to check how your stomach is doing Prescriptions: PRILOSEC 20 MG CPDR (OMEPRAZOLE) 1 by mouth daily as needed heartburn  #30 x 3   Entered and Authorized by:   Milinda Antis MD   Signed by:   Milinda Antis MD on 08/19/2009   Method used:   Electron[facility] to        [facility] [address] [phone]* (retail)       [address]       [place], [place]  [address]       Ph: [phone] or [phone]       Fax: [phone]   RxID:   [id number]    Prevention & Chronic Care Immunizations   Influenza vaccine: Fluvax 3+  (10/16/2008)    Tetanus booster: Not documented    Pneumococcal vaccine: Not documented  Colorectal Screening   Hemoccult: Not documented    Colonoscopy: Not documented  Other Screening   Pap smear: normal  (12/27/2006)   Pap smear due: 01/2008    Mammogram: ASSESSMENT: Negative - BI-RADS 1^MM DIGITAL SCREENING  (01/23/2009)   Smoking status: never  (08/19/2009)  Lipids   Total Cholesterol: 174  (06/21/2006)   LDL: 98  (06/21/2006)   LDL Direct: Not documented   HDL: 40  (06/21/2006)   Triglycerides: 180  (06/21/2006)    SGOT (AST): 13  (06/21/2006)   SGPT (ALT): 16  (06/21/2006)   Alkaline phosphatase: 61  (06/21/2006)   Total bilirubin: 0.6  (06/21/2006)  Self-Management Support :    Lipid self-management support: Not documented   Laboratory Results  Date/Time Received: August 19, 2009 3:47 PM  Date/Time Reported: August 19, 2009 3:57 PM   Wet [place] Source: vag WBC/hpf: LOADED Bacteria/hpf: 3+  Rods Clue cells/hpf: none  Negative whiff Yeast/hpf: none Trichomonas/hpf: none Comments: ...............test performed by......Marland KitchenBonnie A. [place], MLS (ASCP)cm

## 2010-02-03 NOTE — Miscellaneous (Signed)
Summary: needs appt  Clinical Lists Changes states she has a pain since last night located in L side & under L breast. has had this before & been seen by a md for this. denies pressure or pain in center of chest, SOB, nausea, any radiating pain. appt at 2:30 but to call 911 if any of those issues start. she agreed with plan

## 2010-02-03 NOTE — Progress Notes (Signed)
  Phone Note Outgoing Call   Call placed by: Milinda Antis MD,  August 20, 2009 4:09 PM Details for Reason: Lab results Summary of Call: LVM, GC/chlamydia neg, will treat symptomatic discharge, large amount of white cells    New/Updated Medications: FLAGYL 500 MG TABS (METRONIDAZOLE) 1 by mouth two times a day x 7 days Prescriptions: FLAGYL 500 MG TABS (METRONIDAZOLE) 1 by mouth two times a day x 7 days  #14 x 0   Entered and Authorized by:   Milinda Antis MD   Signed by:   Milinda Antis MD on 08/20/2009   Method used:   [facility]        [facility] [address] [phone]* (retail)       [address]       [place], [place]  [address]       Ph: [phone] or [phone]       Fax: [phone]   RxID:   [id number]

## 2010-02-03 NOTE — Progress Notes (Signed)
Summary: phn msg  Phone Note Call from Patient   Caller: Patient Summary of Call: Pt is at work and returning Dr. Deirdre Peer call.  Pt said she would try to call back around 2 today. Initial call taken by: Clydell Hakim,  August 22, 2009 11:58 AM  Follow-up for Phone Call        pt informed. abx to pharmacy. Follow-up by: Arlyss Repress CMA,,  August 22, 2009 12:21 PM

## 2010-02-03 NOTE — Progress Notes (Signed)
Summary: Lab results- left message  Phone Note Outgoing Call   Call placed by: Milinda Antis MD,  August 22, 2009 11:13 AM Details for Reason: Lab results, PAP Smear Summary of Call: Pt at work, left a message for her to return my call.

## 2010-02-05 ENCOUNTER — Encounter: Payer: Self-pay | Admitting: *Deleted

## 2010-02-05 NOTE — Miscellaneous (Signed)
Summary: HOME HEALTH CONNECTION DENIAL  HOME HEALTH CONNECTION DENIAL   Imported By: Shon Hough 01/16/2010 11:12:39  _____________________________________________________________________  External Attachment:    Type:   Image     Comment:   External Document

## 2010-02-10 ENCOUNTER — Encounter: Payer: Self-pay | Admitting: Internal Medicine

## 2010-02-24 ENCOUNTER — Emergency Department (HOSPITAL_COMMUNITY)
Admission: EM | Admit: 2010-02-24 | Discharge: 2010-02-25 | Disposition: A | Discharge status: Home / Self Care | Payer: Self-pay | Attending: Emergency Medicine | Admitting: Emergency Medicine

## 2010-02-24 DIAGNOSIS — R350 Frequency of micturition: Secondary | ICD-10-CM | POA: Insufficient documentation

## 2010-02-24 DIAGNOSIS — K449 Diaphragmatic hernia without obstruction or gangrene: Secondary | ICD-10-CM | POA: Insufficient documentation

## 2010-02-24 DIAGNOSIS — N200 Calculus of kidney: Secondary | ICD-10-CM | POA: Insufficient documentation

## 2010-02-24 DIAGNOSIS — R300 Dysuria: Secondary | ICD-10-CM | POA: Insufficient documentation

## 2010-02-24 DIAGNOSIS — R109 Unspecified abdominal pain: Secondary | ICD-10-CM | POA: Insufficient documentation

## 2010-02-24 DIAGNOSIS — N390 Urinary tract infection, site not specified: Secondary | ICD-10-CM | POA: Insufficient documentation

## 2010-02-24 DIAGNOSIS — N12 Tubulo-interstitial nephritis, not specified as acute or chronic: Secondary | ICD-10-CM | POA: Insufficient documentation

## 2010-02-24 LAB — URINALYSIS, ROUTINE W REFLEX MICROSCOPIC
Nitrite: NEGATIVE
Specific Gravity, Urine: 1.029 (ref 1.005–1.030)
Urobilinogen, UA: 1 mg/dL (ref 0.0–1.0)
pH: 5.5 (ref 5.0–8.0)

## 2010-02-24 LAB — URINE MICROSCOPIC-ADD ON

## 2010-02-25 ENCOUNTER — Emergency Department (HOSPITAL_COMMUNITY): Payer: Self-pay

## 2010-02-27 ENCOUNTER — Ambulatory Visit: Payer: Self-pay | Admitting: Family Medicine

## 2010-03-16 LAB — CBC
Hemoglobin: 12.9 g/dL (ref 12.0–15.0)
MCH: 29.7 pg (ref 26.0–34.0)
MCHC: 34.3 g/dL (ref 30.0–36.0)
MCV: 86.4 fL (ref 78.0–100.0)
RBC: 4.35 MIL/uL (ref 3.87–5.11)

## 2010-03-16 LAB — COMPREHENSIVE METABOLIC PANEL
BUN: 13 mg/dL (ref 6–23)
CO2: 26 mEq/L (ref 19–32)
Calcium: 9.5 mg/dL (ref 8.4–10.5)
Chloride: 101 mEq/L (ref 96–112)
Creatinine, Ser: 0.71 mg/dL (ref 0.4–1.2)
GFR calc non Af Amer: >60 mL/min (ref >60)
Total Bilirubin: 0.7 mg/dL (ref 0.3–1.2)

## 2010-03-16 LAB — DIFFERENTIAL
Eosinophils Absolute: 0.1 10*3/uL (ref 0.0–0.7)
Eosinophils Relative: 1 % (ref 0–5)
Lymphocytes Relative: 29 % (ref 12–46)
Lymphs Abs: 3.2 10*3/uL (ref 0.7–4.0)
Monocytes Absolute: 0.8 10*3/uL (ref 0.1–1.0)
Monocytes Relative: 7 % (ref 3–12)

## 2010-03-16 LAB — LIPASE, BLOOD: Lipase: 32 U/L (ref 11–59)

## 2010-03-18 ENCOUNTER — Emergency Department (HOSPITAL_COMMUNITY)
Admission: EM | Admit: 2010-03-18 | Discharge: 2010-03-19 | Disposition: A | Discharge status: Home / Self Care | Payer: Self-pay | Attending: Emergency Medicine | Admitting: Emergency Medicine

## 2010-03-18 DIAGNOSIS — R110 Nausea: Secondary | ICD-10-CM | POA: Insufficient documentation

## 2010-03-18 DIAGNOSIS — R079 Chest pain, unspecified: Secondary | ICD-10-CM | POA: Insufficient documentation

## 2010-03-18 DIAGNOSIS — E876 Hypokalemia: Secondary | ICD-10-CM | POA: Insufficient documentation

## 2010-03-19 ENCOUNTER — Emergency Department (HOSPITAL_COMMUNITY): Payer: Self-pay

## 2010-03-19 LAB — CBC
Hemoglobin: 11.2 g/dL — ABNORMAL LOW (ref 12.0–15.0)
MCH: 31.3 pg (ref 26.0–34.0)
MCHC: 33.6 g/dL (ref 30.0–36.0)
MCV: 93 fL (ref 78.0–100.0)
RBC: 3.58 MIL/uL — ABNORMAL LOW (ref 3.87–5.11)

## 2010-03-19 LAB — BASIC METABOLIC PANEL
BUN: 14 mg/dL (ref 6–23)
Chloride: 110 mEq/L (ref 96–112)
Creatinine, Ser: 0.65 mg/dL (ref 0.4–1.2)
GFR calc non Af Amer: >60 mL/min (ref >60)
Glucose, Bld: 105 mg/dL — ABNORMAL HIGH (ref 70–99)

## 2010-03-19 LAB — DIFFERENTIAL
Basophils Relative: 0 % (ref 0–1)
Eosinophils Absolute: 0.1 10*3/uL (ref 0.0–0.7)
Lymphs Abs: 2.9 10*3/uL (ref 0.7–4.0)
Monocytes Absolute: 0.5 10*3/uL (ref 0.1–1.0)
Monocytes Relative: 7 % (ref 3–12)
Neutro Abs: 3 10*3/uL (ref 1.7–7.7)
Neutrophils Relative %: 46 % (ref 43–77)

## 2010-03-19 LAB — POCT CARDIAC MARKERS: Troponin i, poc: <0.05 ng/mL (ref 0.00–0.09)

## 2010-03-20 LAB — COMPREHENSIVE METABOLIC PANEL
AST: 28 U/L (ref 0–37)
Albumin: 4.4 g/dL (ref 3.5–5.2)
Alkaline Phosphatase: 61 U/L (ref 39–117)
BUN: 11 mg/dL (ref 6–23)
CO2: 29 mEq/L (ref 19–32)
Chloride: 102 mEq/L (ref 96–112)
GFR calc non Af Amer: >60 mL/min (ref >60)
Potassium: 3.3 mEq/L — ABNORMAL LOW (ref 3.5–5.1)
Total Bilirubin: 0.7 mg/dL (ref 0.3–1.2)

## 2010-03-20 LAB — DIFFERENTIAL
Basophils Absolute: 0 10*3/uL (ref 0.0–0.1)
Basophils Relative: 0 % (ref 0–1)
Eosinophils Relative: 2 % (ref 0–5)
Monocytes Absolute: 0.6 10*3/uL (ref 0.1–1.0)
Neutro Abs: 4.6 10*3/uL (ref 1.7–7.7)

## 2010-03-20 LAB — URINALYSIS, ROUTINE W REFLEX MICROSCOPIC
Glucose, UA: NEGATIVE mg/dL
Ketones, ur: NEGATIVE mg/dL
Nitrite: NEGATIVE
Specific Gravity, Urine: 1.008 (ref 1.005–1.030)
pH: 6 (ref 5.0–8.0)

## 2010-03-20 LAB — GLUCOSE, CAPILLARY
Glucose-Capillary: 129 mg/dL — ABNORMAL HIGH (ref 70–99)
Glucose-Capillary: 154 mg/dL — ABNORMAL HIGH (ref 70–99)

## 2010-03-20 LAB — URINE MICROSCOPIC-ADD ON

## 2010-03-20 LAB — CBC
Hemoglobin: 14.3 g/dL (ref 12.0–15.0)
MCH: 30.1 pg (ref 26.0–34.0)
MCV: 85.1 fL (ref 78.0–100.0)
Platelets: 265 10*3/uL (ref 150–400)
RBC: 4.75 MIL/uL (ref 3.87–5.11)
WBC: 8 10*3/uL (ref 4.0–10.5)

## 2010-03-20 LAB — POCT CARDIAC MARKERS
CKMB, poc: 2.4 ng/mL (ref 1.0–8.0)
Myoglobin, poc: 65.4 ng/mL (ref 12–200)
Troponin i, poc: <0.05 ng/mL (ref 0.00–0.09)

## 2010-03-20 LAB — WET PREP, GENITAL

## 2010-03-20 LAB — LIPASE, BLOOD: Lipase: 24 U/L (ref 11–59)

## 2010-03-25 LAB — D-DIMER, QUANTITATIVE: D-Dimer, Quant: <0.22 ug/mL-FEU (ref 0.00–0.48)

## 2010-03-25 LAB — POCT CARDIAC MARKERS
Myoglobin, poc: 49.9 ng/mL (ref 12–200)
Troponin i, poc: <0.05 ng/mL (ref 0.00–0.09)

## 2010-03-25 LAB — DIFFERENTIAL
Basophils Absolute: 0 10*3/uL (ref 0.0–0.1)
Eosinophils Absolute: 0.1 10*3/uL (ref 0.0–0.7)
Eosinophils Relative: 1 % (ref 0–5)
Lymphs Abs: 2.6 10*3/uL (ref 0.7–4.0)
Neutrophils Relative %: 55 % (ref 43–77)

## 2010-03-25 LAB — CBC
HCT: 35 % — ABNORMAL LOW (ref 36.0–46.0)
MCV: 96.3 fL (ref 78.0–100.0)
Platelets: 254 10*3/uL (ref 150–400)
RDW: 13 % (ref 11.5–15.5)
WBC: 6.9 10*3/uL (ref 4.0–10.5)

## 2010-03-25 LAB — POCT I-STAT, CHEM 8
BUN: 16 mg/dL (ref 6–23)
Calcium, Ion: 1.05 mmol/L — ABNORMAL LOW (ref 1.12–1.32)
HCT: 35 % — ABNORMAL LOW (ref 36.0–46.0)
Hemoglobin: 11.9 g/dL — ABNORMAL LOW (ref 12.0–15.0)
Sodium: 140 mEq/L (ref 135–145)
TCO2: 24 mmol/L (ref 0–100)

## 2010-03-29 LAB — BASIC METABOLIC PANEL
BUN: 10 mg/dL (ref 6–23)
BUN: 5 mg/dL — ABNORMAL LOW (ref 6–23)
Calcium: 8.3 mg/dL — ABNORMAL LOW (ref 8.4–10.5)
Calcium: 8.7 mg/dL (ref 8.4–10.5)
Calcium: 9 mg/dL (ref 8.4–10.5)
Chloride: 107 mEq/L (ref 96–112)
Creatinine, Ser: 0.65 mg/dL (ref 0.4–1.2)
Creatinine, Ser: 0.66 mg/dL (ref 0.4–1.2)
GFR calc Af Amer: >60 mL/min (ref >60)
GFR calc Af Amer: >60 mL/min (ref >60)
GFR calc Af Amer: >60 mL/min (ref >60)
GFR calc non Af Amer: >60 mL/min (ref >60)
GFR calc non Af Amer: >60 mL/min (ref >60)
GFR calc non Af Amer: >60 mL/min (ref >60)
GFR calc non Af Amer: >60 mL/min (ref >60)
Glucose, Bld: 113 mg/dL — ABNORMAL HIGH (ref 70–99)
Glucose, Bld: 120 mg/dL — ABNORMAL HIGH (ref 70–99)
Glucose, Bld: 140 mg/dL — ABNORMAL HIGH (ref 70–99)
Potassium: 4.1 mEq/L (ref 3.5–5.1)
Potassium: 4.6 mEq/L (ref 3.5–5.1)
Sodium: 140 mEq/L (ref 135–145)
Sodium: 142 mEq/L (ref 135–145)

## 2010-03-29 LAB — CBC
HCT: 34.5 % — ABNORMAL LOW (ref 36.0–46.0)
HCT: 36 % (ref 36.0–46.0)
HCT: 38.7 % (ref 36.0–46.0)
Hemoglobin: 11.7 g/dL — ABNORMAL LOW (ref 12.0–15.0)
Hemoglobin: 11.9 g/dL — ABNORMAL LOW (ref 12.0–15.0)
Hemoglobin: 12.9 g/dL (ref 12.0–15.0)
MCHC: 34.5 g/dL (ref 30.0–36.0)
MCV: 95.8 fL (ref 78.0–100.0)
MCV: 96.5 fL (ref 78.0–100.0)
Platelets: 243 10*3/uL (ref 150–400)
Platelets: 284 10*3/uL (ref 150–400)
RBC: 3.53 MIL/uL — ABNORMAL LOW (ref 3.87–5.11)
RBC: 4.04 MIL/uL (ref 3.87–5.11)
RDW: 12.8 % (ref 11.5–15.5)
RDW: 13 % (ref 11.5–15.5)
RDW: 13.1 % (ref 11.5–15.5)
WBC: 11.3 10*3/uL — ABNORMAL HIGH (ref 4.0–10.5)
WBC: 4.7 10*3/uL (ref 4.0–10.5)

## 2010-03-29 LAB — COMPREHENSIVE METABOLIC PANEL
Albumin: 4.1 g/dL (ref 3.5–5.2)
Alkaline Phosphatase: 67 U/L (ref 39–117)
BUN: 17 mg/dL (ref 6–23)
CO2: 24 mEq/L (ref 19–32)
Chloride: 103 mEq/L (ref 96–112)
Creatinine, Ser: 0.64 mg/dL (ref 0.4–1.2)
GFR calc non Af Amer: >60 mL/min (ref >60)
Glucose, Bld: 116 mg/dL — ABNORMAL HIGH (ref 70–99)
Potassium: 3.2 mEq/L — ABNORMAL LOW (ref 3.5–5.1)
Total Bilirubin: 1.5 mg/dL — ABNORMAL HIGH (ref 0.3–1.2)

## 2010-03-29 LAB — URINALYSIS, ROUTINE W REFLEX MICROSCOPIC
Ketones, ur: NEGATIVE mg/dL
Nitrite: NEGATIVE
Specific Gravity, Urine: 1.034 — ABNORMAL HIGH (ref 1.005–1.030)
Urobilinogen, UA: 1 mg/dL (ref 0.0–1.0)

## 2010-03-29 LAB — DIFFERENTIAL
Basophils Absolute: 0.1 10*3/uL (ref 0.0–0.1)
Basophils Relative: 1 % (ref 0–1)
Lymphocytes Relative: 18 % (ref 12–46)
Monocytes Absolute: 0.7 10*3/uL (ref 0.1–1.0)
Neutro Abs: 8.4 10*3/uL — ABNORMAL HIGH (ref 1.7–7.7)

## 2010-03-29 LAB — URINE CULTURE

## 2010-03-31 ENCOUNTER — Encounter: Payer: Self-pay | Admitting: Internal Medicine

## 2010-03-31 ENCOUNTER — Ambulatory Visit (INDEPENDENT_AMBULATORY_CARE_PROVIDER_SITE_OTHER): Payer: Medicaid Other | Admitting: Internal Medicine

## 2010-03-31 DIAGNOSIS — E119 Type 2 diabetes mellitus without complications: Secondary | ICD-10-CM

## 2010-03-31 DIAGNOSIS — E876 Hypokalemia: Secondary | ICD-10-CM | POA: Insufficient documentation

## 2010-03-31 DIAGNOSIS — R3129 Other microscopic hematuria: Secondary | ICD-10-CM | POA: Insufficient documentation

## 2010-03-31 DIAGNOSIS — I10 Essential (primary) hypertension: Secondary | ICD-10-CM

## 2010-03-31 DIAGNOSIS — Z008 Encounter for other general examination: Secondary | ICD-10-CM

## 2010-03-31 DIAGNOSIS — E785 Hyperlipidemia, unspecified: Secondary | ICD-10-CM

## 2010-03-31 LAB — POCT URINALYSIS DIPSTICK
Bilirubin, UA: NEGATIVE
Glucose, UA: NEGATIVE
Nitrite, UA: NEGATIVE
Spec Grav, UA: >=1.03
pH, UA: 5.5

## 2010-03-31 LAB — LIPID PANEL
HDL: 54 mg/dL (ref >39)
LDL Cholesterol: 65 mg/dL (ref 0–99)
Triglycerides: 89 mg/dL (ref <150)

## 2010-03-31 LAB — POCT GLYCOSYLATED HEMOGLOBIN (HGB A1C): Hemoglobin A1C: 6.5

## 2010-03-31 MED ORDER — LISINOPRIL-HYDROCHLOROTHIAZIDE 20-12.5 MG PO TABS: 1.0000 | ORAL_TABLET | Freq: Every day | ORAL | Status: DC

## 2010-03-31 MED ORDER — PRAVASTATIN SODIUM 40 MG PO TABS: 40.0000 mg | ORAL_TABLET | Freq: Every day | ORAL | Status: DC

## 2010-03-31 MED ORDER — METFORMIN HCL 1000 MG PO TABS: 1000.0000 mg | ORAL_TABLET | Freq: Two times a day (BID) | ORAL | Status: DC

## 2010-03-31 NOTE — Patient Instructions (Addendum)
Please, take all your medications as prescribed. Please, follow up in 1 month for a well woman exam and a repeat of urinalysis. Call with any questions.

## 2010-03-31 NOTE — Assessment & Plan Note (Signed)
Recheck Lipid and LFT panels today. Continue with pravastatin, low cholesterol and high fiber diet.

## 2010-03-31 NOTE — Assessment & Plan Note (Signed)
Well controlled. Foot care, medications, diet, DM-associated complications reviewed with the patient. No change in therapy. Metformin refilled.

## 2010-03-31 NOTE — Progress Notes (Signed)
  Subjective:    Patient ID: Janice Massey, female    DOB: 04-Jul-1955, 55 y.o.   MRN: [id number]  Hypertension This is a chronic problem. The current episode started more than 1 year ago. The problem has been gradually improving since onset. The problem is controlled. Pertinent negatives include no anxiety, blurred vision, chest pain, headaches, malaise/fatigue, neck pain, orthopnea, palpitations, peripheral edema, PND, shortness of breath or sweats. There are no associated agents to hypertension. Risk factors for coronary artery disease include diabetes mellitus, dyslipidemia, family history and sedentary lifestyle. Past treatments include angiotensin blockers and lifestyle changes. The current treatment provides moderate improvement. Compliance problems include exercise.  There is no history of angina, kidney disease, CAD/MI, CVA, heart failure, left ventricular hypertrophy, PVD, renovascular disease or retinopathy. There is no history of chronic renal disease, coarctation of the aorta, hyperparathyroidism, a hypertension causing med or sleep apnea.  Hyperlipidemia She has no history of chronic renal disease. Pertinent negatives include no chest pain or shortness of breath.  Diabetes Pertinent negatives for hypoglycemia include no headaches or sweats. Pertinent negatives for diabetes include no blurred vision, no chest pain and no weakness. Pertinent negatives for diabetic complications include no CVA, PVD or retinopathy.      Review of Systems  Constitutional: Negative.  Negative for malaise/fatigue.  HENT: Negative.  Negative for neck pain.   Eyes: Negative for blurred vision and visual disturbance.  Respiratory: Negative for cough and shortness of breath.   Cardiovascular: Negative for chest pain, palpitations, orthopnea and PND.  Musculoskeletal: Positive for back pain.  Neurological: Negative for syncope, weakness, light-headedness and headaches.       Objective:   Physical Exam    Constitutional: She is oriented to person, place, and time. She appears well-developed.  HENT:  Right Ear: External ear normal.  Left Ear: External ear normal.  Nose: Nose normal.  Mouth/Throat: Oropharynx is clear and moist.  Eyes: Conjunctivae are normal. Pupils are equal, round, and reactive to light.  Neck: Normal range of motion. No JVD present. No thyromegaly present.  Cardiovascular: Normal rate, regular rhythm, normal heart sounds and intact distal pulses.   Pulmonary/Chest: Effort normal and breath sounds normal.  Abdominal: Soft. Bowel sounds are normal. She exhibits no distension. There is no tenderness. There is no rebound.  Musculoskeletal: Normal range of motion.  Lymphadenopathy:    She has no cervical adenopathy.  Neurological: She is alert and oriented to person, place, and time. She has normal reflexes. No cranial nerve deficit.  Skin: Skin is warm.  Psychiatric: She has a normal mood and affect. Her behavior is normal.          Assessment & Plan:

## 2010-03-31 NOTE — Assessment & Plan Note (Signed)
Uncontrolled. Patient did not take her medication this am --"ran out." will recheck  Next OV in 4 weeks.

## 2010-04-01 LAB — COMPLETE METABOLIC PANEL WITH GFR
AST: 24 U/L (ref 0–37)
Alkaline Phosphatase: 59 U/L (ref 39–117)
BUN: 16 mg/dL (ref 6–23)
CO2: 26 mEq/L (ref 19–32)
Calcium: 9.7 mg/dL (ref 8.4–10.5)
Creat: 0.79 mg/dL (ref 0.40–1.20)
Total Bilirubin: 0.7 mg/dL (ref 0.3–1.2)

## 2010-04-06 LAB — URINE MICROSCOPIC-ADD ON

## 2010-04-06 LAB — DIFFERENTIAL
Basophils Relative: 0 % (ref 0–1)
Eosinophils Absolute: 0 10*3/uL (ref 0.0–0.7)
Lymphs Abs: 2 10*3/uL (ref 0.7–4.0)
Monocytes Absolute: 0.6 10*3/uL (ref 0.1–1.0)
Monocytes Relative: 5 % (ref 3–12)
Neutro Abs: 10.8 10*3/uL — ABNORMAL HIGH (ref 1.7–7.7)

## 2010-04-06 LAB — URINALYSIS, ROUTINE W REFLEX MICROSCOPIC
Nitrite: NEGATIVE
Specific Gravity, Urine: 1.03 (ref 1.005–1.030)
pH: 5.5 (ref 5.0–8.0)

## 2010-04-06 LAB — CBC
HCT: 32.6 % — ABNORMAL LOW (ref 36.0–46.0)
HCT: 37.1 % (ref 36.0–46.0)
Hemoglobin: 11.4 g/dL — ABNORMAL LOW (ref 12.0–15.0)
MCV: 96.4 fL (ref 78.0–100.0)
MCV: 97.6 fL (ref 78.0–100.0)
Platelets: 276 10*3/uL (ref 150–400)
Platelets: 282 10*3/uL (ref 150–400)
Platelets: 312 10*3/uL (ref 150–400)
RBC: 3.42 MIL/uL — ABNORMAL LOW (ref 3.87–5.11)
RDW: 13.3 % (ref 11.5–15.5)
RDW: 13.3 % (ref 11.5–15.5)
RDW: 13.4 % (ref 11.5–15.5)
WBC: 6.2 10*3/uL (ref 4.0–10.5)
WBC: 9.6 10*3/uL (ref 4.0–10.5)

## 2010-04-06 LAB — URINE CULTURE: Colony Count: 15000

## 2010-04-06 LAB — BASIC METABOLIC PANEL
BUN: 4 mg/dL — ABNORMAL LOW (ref 6–23)
BUN: 4 mg/dL — ABNORMAL LOW (ref 6–23)
CO2: 29 mEq/L (ref 19–32)
Chloride: 107 mEq/L (ref 96–112)
Creatinine, Ser: 0.72 mg/dL (ref 0.4–1.2)
GFR calc non Af Amer: >60 mL/min (ref >60)
GFR calc non Af Amer: >60 mL/min (ref >60)
Glucose, Bld: 120 mg/dL — ABNORMAL HIGH (ref 70–99)
Glucose, Bld: 123 mg/dL — ABNORMAL HIGH (ref 70–99)
Glucose, Bld: 137 mg/dL — ABNORMAL HIGH (ref 70–99)
Potassium: 3 mEq/L — ABNORMAL LOW (ref 3.5–5.1)
Potassium: 3.8 mEq/L (ref 3.5–5.1)
Sodium: 137 mEq/L (ref 135–145)

## 2010-04-06 LAB — COMPREHENSIVE METABOLIC PANEL
ALT: 18 U/L (ref 0–35)
Albumin: 4.5 g/dL (ref 3.5–5.2)
Alkaline Phosphatase: 67 U/L (ref 39–117)
Potassium: 3.2 mEq/L — ABNORMAL LOW (ref 3.5–5.1)
Sodium: 138 mEq/L (ref 135–145)
Total Protein: 7.8 g/dL (ref 6.0–8.3)

## 2010-04-10 LAB — POCT CARDIAC MARKERS
CKMB, poc: 2.8 ng/mL (ref 1.0–8.0)
Myoglobin, poc: 78.4 ng/mL (ref 12–200)
Troponin i, poc: <0.05 ng/mL (ref 0.00–0.09)

## 2010-04-10 LAB — COMPREHENSIVE METABOLIC PANEL
AST: 22 U/L (ref 0–37)
Albumin: 3.9 g/dL (ref 3.5–5.2)
BUN: 11 mg/dL (ref 6–23)
CO2: 30 mEq/L (ref 19–32)
Calcium: 9.3 mg/dL (ref 8.4–10.5)
Chloride: 100 mEq/L (ref 96–112)
Creatinine, Ser: 0.9 mg/dL (ref 0.4–1.2)
GFR calc Af Amer: >60 mL/min (ref >60)
GFR calc non Af Amer: >60 mL/min (ref >60)
Total Bilirubin: 0.6 mg/dL (ref 0.3–1.2)

## 2010-04-10 LAB — DIFFERENTIAL
Basophils Absolute: 0 10*3/uL (ref 0.0–0.1)
Lymphocytes Relative: 27 % (ref 12–46)
Lymphs Abs: 1.9 10*3/uL (ref 0.7–4.0)
Neutro Abs: 4.6 10*3/uL (ref 1.7–7.7)

## 2010-04-10 LAB — CBC
HCT: 36.9 % (ref 36.0–46.0)
MCHC: 33.7 g/dL (ref 30.0–36.0)
MCV: 89.8 fL (ref 78.0–100.0)
Platelets: 254 10*3/uL (ref 150–400)
WBC: 7.2 10*3/uL (ref 4.0–10.5)

## 2010-04-10 LAB — LIPASE, BLOOD: Lipase: 22 U/L (ref 11–59)

## 2010-04-10 LAB — URINALYSIS, ROUTINE W REFLEX MICROSCOPIC
Bilirubin Urine: NEGATIVE
Hgb urine dipstick: NEGATIVE
Ketones, ur: NEGATIVE mg/dL
Nitrite: NEGATIVE
Urobilinogen, UA: 0.2 mg/dL (ref 0.0–1.0)

## 2010-04-20 LAB — POCT CARDIAC MARKERS: CKMB, poc: 3.9 ng/mL (ref 1.0–8.0)

## 2010-04-20 LAB — DIFFERENTIAL
Eosinophils Absolute: 0.1 10*3/uL (ref 0.0–0.7)
Lymphs Abs: 2.3 10*3/uL (ref 0.7–4.0)
Neutro Abs: 6.3 10*3/uL (ref 1.7–7.7)
Neutrophils Relative %: 68 % (ref 43–77)

## 2010-04-20 LAB — URINALYSIS, ROUTINE W REFLEX MICROSCOPIC
Bilirubin Urine: NEGATIVE
Hgb urine dipstick: NEGATIVE
Ketones, ur: NEGATIVE mg/dL
Specific Gravity, Urine: 1.007 (ref 1.005–1.030)
Urobilinogen, UA: 0.2 mg/dL (ref 0.0–1.0)
pH: 7 (ref 5.0–8.0)

## 2010-04-20 LAB — COMPREHENSIVE METABOLIC PANEL
ALT: 24 U/L (ref 0–35)
BUN: 12 mg/dL (ref 6–23)
CO2: 26 mEq/L (ref 19–32)
Calcium: 9.3 mg/dL (ref 8.4–10.5)
Creatinine, Ser: 0.74 mg/dL (ref 0.4–1.2)
GFR calc non Af Amer: >60 mL/min (ref >60)
Glucose, Bld: 137 mg/dL — ABNORMAL HIGH (ref 70–99)
Sodium: 140 mEq/L (ref 135–145)
Total Protein: 7.4 g/dL (ref 6.0–8.3)

## 2010-04-20 LAB — CBC
HCT: 39.5 % (ref 36.0–46.0)
Hemoglobin: 13.4 g/dL (ref 12.0–15.0)
MCHC: 33.9 g/dL (ref 30.0–36.0)
MCV: 88.4 fL (ref 78.0–100.0)
RBC: 4.47 MIL/uL (ref 3.87–5.11)
RDW: 13.1 % (ref 11.5–15.5)

## 2010-04-20 LAB — D-DIMER, QUANTITATIVE: D-Dimer, Quant: 0.23 ug/mL-FEU (ref 0.00–0.48)

## 2010-04-20 LAB — URINE CULTURE: Colony Count: NO GROWTH

## 2010-04-20 LAB — LIPASE, BLOOD: Lipase: 28 U/L (ref 11–59)

## 2010-05-19 NOTE — Op Note (Signed)
Brandi Mendez, Brandi Mendez                  ACCOUNT NO.:  [id number]   MEDICAL RECORD NO.:  [id number]          PATIENT TYPE:  AMB   LOCATION:  ENDO                         FACILITY:  [facility]   PHYSICIAN:  Bernette Redbird, M.D.   DATE OF BIRTH:  [birth year]   DATE OF PROCEDURE:  02/13/2008  DATE OF DISCHARGE:                               OPERATIVE REPORT   PROCEDURE:  Colonoscopy.   INDICATIONS:  Initial screening exam in a 55 year old [place] American  female.   FINDINGS:  Normal exam.   PROCEDURE IN DETAIL:  The purpose and risks of the procedure have been  reviewed with the patient, both through our open access program and by  me immediately prior to the procedure.  She had provided written  consent.  Sedation was fentanyl 50 mcg and Versed 7.5 mg IV with some  mild hypotension, but no clinical instability.  The Pentax adult video  colonoscope was advanced to the terminal ileum, using a little bit of  external abdominal compression to help control looping.  Pullback was  then performed.  The quality of prep was excellent and it was felt that  all areas were well seen.   This was a normal examination.  No polyps, cancer, colitis, vascular  malformations, or diverticulosis were noted, and retroflexion of the  rectum and reinspection of the rectum were unremarkable.  No biopsies  were obtained.  The patient tolerated the procedure well and there were  no apparent complications.   IMPRESSION:  Normal initial screening colonoscopy in a standard risk  individual.   RECOMMENDATIONS:  Repeat colonoscopy for screening in 10 years.           ______________________________  Bernette Redbird, M.D.     RB/MEDQ  D:  02/13/2008  T:  02/14/2008  Job:  [id number]   cc:   [facility]

## 2010-05-22 NOTE — Discharge Summary (Signed)
NAME:  Janice Massey, Janice Massey                            ACCOUNT NO.:  [id number]   MEDICAL RECORD NO.:  [id number]                   PATIENT TYPE:  INP   LOCATION:  4736                                 FACILITY:  [facility]   PHYSICIAN:  Barney Drain, M.D.                 DATE OF BIRTH:  1955-11-15   DATE OF ADMISSION:  11/02/2001  DATE OF DISCHARGE:  11/03/2001                                 DISCHARGE SUMMARY   DISCHARGE DIAGNOSES:  1. Chest pain.  2. History of congenital heart disease.  3. Abdominal pain.  4. Hyperglycemia.  5. Elevated blood pressure.  6. Lymphadenopathy.   DISCHARGE MEDICATIONS:  1. Albuterol inhaler p.r.n.  2. May use Maalox, Tums or Rolaids for chest burning or discomfort.   DISCHARGE INSTRUCTIONS:  Diet, the patient is to restrict fatty and fried  foods and to decrease her sweets and breads.  Special instructions, the  patient is to participate in moderate exercise for 30 minutes each day three  times a week.   DISPOSITION:  The patient was discharged to home in stable condition.   FOLLOW UP:  The outpatient clinic will contact her regarding a followup  appointment with Dr. Viviann Spare in the outpatient clinic. The patient is to  have her progress with diet control and exercise. Please followup on a neck  CT scan performed which was pending at the time of discharge, as the patient  has a history of a 5 mm right posterior triangle lymph node and a precarinal  lymph node.   PROCEDURES:  A 2D echo.   HISTORY OF PRESENT ILLNESS:  The patient is a 55 year old female who is deaf  with suspected congenital heart disease via a 2d echo from February 1998  which showed a right to left shunt by CSD, who presents to the emergency  department complaining of substernal chest pain that had been constant for  three days prior to admission. There is radiation to the left shoulder and  arm. The patient has had increasing shortness of breath and cough that was  productive and  red in color. No rhinitis, temperatures, chills or sweats.  She has had nausea and vomiting with the diarrhea for two days and has  subsequently been anorexic for two days. Positive low abdomen discomfort.  The patient states that she has had chest pain like this in the far remote  past and it is unclear if any treatment was given at that time.   PHYSICAL EXAMINATION:  VITAL SIGNS:  Pertinent findings on physical  examination included a temperature of 98.6, heart rate 102, blood pressure  139/93, O2 saturation 96% on room air.  GENERAL:  The patient was resting on  a stretcher comfortable, an obese  female.  HEENT:  The oropharynx was clear without exudate or erythema.  NECK:  No thyromegaly, no masses. A diffuse firmness behind the right  sternocleidomastoid  without clear margins.  RESPIRATORY:  Showed good air exchange, no accessory muscle use, with wheeze  in the left lower lobe region.  CARDIOVASCULAR:  Normal precordium, S1, S2, regular rate and rhythm without  ectopy.  ABDOMEN: Showed an obese female with pannus, positive bowel sounds, positive  bilateral lower abdominal diminished to palpation. No masses or  hepatosplenomegaly, no rebound or guarding, well healed surgical incisions  suggestive of a cholecystectomy.   LABORATORY DATA:  Sodium 135, potassium 3.2, chloride 98, bicarbonate 27,  BUN 3, creatinine 0.7, glucose 173.  Bilirubin  0.7, alkaline phosphatase  47, AST 28, ALT 25, protein 7.6, albumin 3.7. White count 6.0, hemoglobin  12.9. Lipase 22. Urinalysis:  Small leukocyte esterase, 0 to 2 red blood  cells, 0 to 2 white blood cells, no bacteria.   An electrocardiogram, sinus tachycardia and a normal axis.   HOSPITAL COURSE:  PROBLEM #1:  The patient is deaf but communicates well.   PROBLEM #2:  HISTORY OF CONGENITAL HEART DISEASE BY ECHO  IN [place] 1998:  The echo was repeated and showed a very small perimembranous  ventriculoseptal defect without pulmonary  hypertension, mildly dilated left  atrium and an ejection fraction of 55% to 65%. Normal systolic function was  noted. Cardiac markers were negative x 2. An ECG showed no ischemic changes.  Her TSH was 3.343, within normal limits. Fasting lipid profile showed a  total cholesterol of 176, triglycerides 159, which were elevated,  HDL 46,  LDL 98 and VLDL 32.   PROBLEM # 3, CHEST PAIN:  Cardiac etiology was ruled out as noted above. The  patient had been placed empirically on azithromycin. A chest CT scan showed  no PE and a 2 cm precarinal lymph node with recommended followup in four to  five months and a left lower lobe opacity, unclear whether this was  infection or inflammation. A PPD was placed on November 02, 2001.   PROBLEM #4, ABDOMINAL PAIN:  This had been evaluated in the past with a  pelvic and abdominal ultrasound and repeated urinalyses, all of which showed  no pathology. The patient is status post cholecystectomy. The day after  admission the patient was stating that her abdominal pain had resolved.   PROBLEM #5, HYPERGLYCEMIA:  The patient's fasting CBG showed glucose of 168,  hemoglobin A1C was 7.7. The patient was consulted on her elevated blood  sugars and was given information about early diabetes and lifestyle  modification to control her sugars including diet and exercise. Elevated  blood pressure at the time of presentation resolved and  remained within  normal limits throughout the remainder of the patient's admission.   PROBLEM #6, HYPERTRIGLYCERIDEMIA:  The patient was  consulted on dietary  modification and exercise.                                                Barney Drain, M.D.    SS/MEDQ  D:  11/04/2001  T:  11/06/2001  Job:  [id number]   cc:   Oretha Ellis, M.D.  [address] [place], [place] [address]  Fax: [phone]

## 2010-05-22 NOTE — Group Therapy Note (Signed)
   NAME:  Janice Massey, Janice Massey NO.:  [id number]   MEDICAL RECORD NO.:  [id number]                   PATIENT TYPE:  OUT   LOCATION:  [facility]                           FACILITY:  [facility]   PHYSICIAN:  Argentina Donovan, M.D.                   DATE OF BIRTH:  [birth year]   DATE OF SERVICE:                                    CLINIC NOTE   HISTORY OF PRESENT ILLNESS:  The patient is a 55 year old para 2-0-0-2 who  was referred from the [facility] because they said they were  unable to do Pap smear.  The patient is hearing impaired and we had a  translator with [place].  She said she had never had a problem having a Pap  smear.  She has had a history of tubal ligation.  She is on  hydrochlorothiazide for hypertension and is a diet controlled diabetic.  The  patient weighs 213 pounds.   PHYSICAL EXAMINATION:  ABDOMEN:  Soft, flat, nontender.  No mass or  organomegaly.  PELVIC:  External genitalia is normal.  BUS within normal limits.  Vagina  was clean with a small amount of blood at the terminal part of her period  coming from the cervical os.  Cervix was parous.  Pap smear was taken.  The  uterus was normal size, shape, consistency.  Adnexa could not be well  outlined, but did not seem enlarged.   IMPRESSION:  Normal gynecologic examination with Pap smear.                                               Argentina Donovan, M.D.    PR/MEDQ  D:  07/10/2002  T:  07/10/2002  Job:  [id number]

## 2010-05-22 NOTE — Discharge Summary (Signed)
Janice Massey, Janice Massey                  ACCOUNT NO.:  [id number]   MEDICAL RECORD NO.:  [id number]          PATIENT TYPE:  INP   LOCATION:  4735                         FACILITY:  [facility]   PHYSICIAN:  Harrie Jeans, M.D.     DATE OF BIRTH:  11/25/1955   DATE OF ADMISSION:  10/26/2003  DATE OF DISCHARGE:  10/28/2003                                 DISCHARGE SUMMARY   DISCHARGE DIAGNOSES:  1.  Atypical chest pain.  2.  Gastroesophageal reflux disease.  3.  Costochondritis.   OTHER DIAGNOSES:  1.  Non-insulin-dependent diabetes.  2.  Hypertension.  3.  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  4.  History of cholecystectomy.  5.  Deaf.   DISCHARGE MEDICATIONS:  1.  Protonix 40 mg b.i.d.  2.  Ultram 50 mg t.i.d.  3.  Glucophage 500 mg daily.  4.  Vaseretic 10/25 one daily.  5.  Albuterol as needed.  6.  Advair Diskus 1 inhale twice daily.   PROCEDURE:  Echocardiogram, results pending at time of discharge.   CONSULTANTS:  None.   RADIOLOGY:  None.   DISCHARGE LABORATORIES:  Hemoglobin 13.1, white count 7.3, platelets 315,  lipase 21, BUN 9, creatinine 0.9, potassium 3.3.   HISTORY:  Brief HPI, the patient is a 55 year old female with past medical  history of diabetes, hypertension, congestive heart defect with small VSD.  She presented to the emergency department on 10/27/2003 with complaints of  substernal chest pain with radiation to the left arm for 4 days priors to  admission.  She reported 1 episode of vomiting, otherwise, no associated  symptoms. She felt that her pain was secondary to her moving a television  and she also reported that she was under a lot of stress. She reported  similar pain in the past.  At the emergency department personnel called  medicine for admission.   HOSPITAL COURSE BY PROBLEM:  Problem 1:  CHEST PAIN.  The patient was seen  and evaluated in the emergency department as she was noted to have cardiac  point of care  markers negative x3 and EKG that revealed normal sinus rhythm  with no ST or T wave abnormalities.  Given her risk factors she was admitted  to the hospital for rule out.  She subsequently had 3 sets of cardiac  enzymes that were negative.  She also had a D-dimmer which was noted to be  0.25 which is negative.  She had a BNP that was noted to be less than 32.  On chest x-ray she had no widening of the mediastinum and her pulses were  equal in both arms. It was felt that her chest pain was likely secondary to  reflux disease or peptic ulcer disease as well as some costochondritis.  At  the time of discharge she did have a serum H. pylori which was pending. She  is discharged on b.i.d. Protonix and will likely need a GI referral as an  outpatient.   Problem 2:  CORONARY HEART DEFECT.  Apparently she has a history of  VSD.  While she has not had an echocardiogram in some time, she will have an  echocardiogram done prior to discharge and these results will needed to be  followed up in her outpatient visit.  She is noted to have a 3/6 murmur.   Problem 3:  DIABETES.  While as an inpatient her Glucophage was held.  Her  CBGs ranged from 95-122.  She was covered with sliding scale insulin.   Problem 4:  HYPERTENSION.  She was continued on her Vaseretic and her blood  pressures remained under good control.   Problem 5:  Hypokalemia.  She has noted to be low in the past.  She was  given repletion.  Magnesium and phosphatase were both checked which were  normal.  This is likely secondary to the hydrochlorothiazide and might  consider switching to Maxzide as an outpatient.   FOLLOW UP:  She will follow up at [facility] on November 7  at 11 a.m.  At that followup visit she will need to have a BNP draw to  follow up her potassium.       WA/MEDQ  D:  10/28/2003  T:  10/28/2003  Job:  [id number]

## 2010-05-22 NOTE — Discharge Summary (Signed)
[facility]  Patient:    Brandi Mendez, Brandi Mendez Visit Number: [id number] MRN: [id number]          Service Type: GYN Location: 9300 [id number] Attending Physician:  Tammi Sou Dictated by:   Bing Neighbors Clearance Coots, M.D. Admit Date:  10/12/2000 Discharge Date: 10/16/2000                             Discharge Summary  ADMITTING DIAGNOSIS:          Recurrent pelvic inflammatory disease and                               tubo-ovarian abscess.  DISCHARGE DIAGNOSIS:          Recurrent pelvic inflammatory disease and                               tubo-ovarian abscess.  CONDITION ON DISCHARGE:       No significant improvement during admission.  DISPOSITION:                  Transferred to [facility] GYN-Oncology Service for further definitive surgical management.  REASON FOR ADMISSION:         A 55 year old black female, G1, P1, presented to triage by emergency medical service with complaint of severe abdominal pain. The patient was discharged previously from the hospital after a two-week hospital course for pelvic inflammatory disease and tubo-ovarian abscesses. She was discharged on October 09, 2000 and was not able to take her discharge antibiotic medication because of nausea and vomiting.  PAST MEDICAL HISTORY:  SURGERY:                      Cholecystectomy.  ILLNESSES:                    None except previous pelvic inflammatory disease and tubo-ovarian abscesses.  MEDICATIONS:                  Flagyl, doxycycline.  ALLERGIES:                    No known drug allergies.  SOCIAL HISTORY:               Denies tobacco, alcohol, or recreational drug use.  PHYSICAL EXAMINATION:  GENERAL:                      Well-nourished, well-developed, black female in moderate distress.  VITAL SIGNS:                  Temperature 98.2, pulse 108, respiratory rate 24, blood pressure 120/77.  LUNGS:                        Clear to auscultation  bilaterally.  CARDIOVASCULAR:               Regular rate and rhythm.  ABDOMEN:                      Soft, very tender to palpation of lower quadrants.  Negative rebound tenderness.  PELVIC:  On sterile speculum exam, there is a large amount of grayish thick discharge.  Tender to palpation on pelvic exam and the uterus cannot be assessed due to guarding.  LABORATORY VALUES:            Cath urinalysis reveals specific gravity of 1.015, moderate hemoglobin, 30 mg of protein.  Microscopic revealed granular casts and 3-6 white blood cells.  Wet prep revealed few Trichomonas and moderate white blood cells.  CBC revealed a hemoglobin of 10.6, hematocrit of 30.4, white blood cell count 23,900, platelets of 811,000.  IMPRESSION:                   Pelvic inflammatory disease, history of tubo-ovarian abscesses.  PLAN:                         Admit, restart IV antibiotic therapy, supportive care.  HOSPITAL COURSE:              The patient was admitted and her condition progressively worsened despite IV triple antibiotic therapy with ampicillin, gentamicin, and clindamycin.  She developed decreased bowel activity by hospital day #2 and a pelvic ultrasound was obtained which revealed a large 10 cm mass on the right with a rim of free fluid anteriorly.  Flat plate and upright of the abdomen revealed increased gas pattern in the upper large bowels, no air-fluid levels, no evidence of bowel obstruction.  GYN-Oncology at [facility] was consulted because of the patients recurrent history of tubo-ovarian abscesses and possible developing ileus and it was recommended that the patient could best be managed at a more tertiary surgical center and the GYN-Oncology Service at [facility] agreed to accept the patient in transfer and to further manage her condition with possible surgical intervention if necessary.  The patient was therefore, transferred to [facility] for further  management of worsening pelvic inflammatory disease and tubo-ovarian abscesses on hospital day #4.  DISCHARGE LABORATORY VALUES:  Hemoglobin 9.4, hematocrit 27.3, white blood cell count 18,300, platelets 624,000.  DISCHARGE DISPOSITION:        The patient was discharged from [facility] and transferred to [facility] GYN-Oncology Service by _________ and she will follow up with the GYN Clinic at [facility] once she has received definitive management at [facility]. Dictated by:   Bing Neighbors Clearance Coots, M.D. Attending Physician:  Tammi Sou DD:  12/14/00 TD:  12/14/00 Job: [id number]

## 2010-05-22 NOTE — Op Note (Signed)
NAME:  Brandi Mendez, Brandi Mendez                  ACCOUNT NO.:  [id number]   MEDICAL RECORD NO.:  [id number]          PATIENT TYPE:  OIB   LOCATION:  1010                         FACILITY:  [facility]   PHYSICIAN:  Thornton Park. Daphine Deutscher, MD  DATE OF BIRTH:  [birth year]   DATE OF PROCEDURE:  03/09/2005  DATE OF DISCHARGE:  03/10/2005                                 OPERATIVE REPORT   CCS#:  [id number].   PREOPERATIVE DIAGNOSIS:  Incarcerated ventral incisional hernia.   POSTOPERATIVE DIAGNOSIS:  Incarcerated ventral incisional hernia containing  small bowel densely adherent to the hernia sac.   PROCEDURE:  Laparoscopic takedown of incarcerated ventral incisional hernia  with repair using bard ventralex 8 cm round hernia patch.   SURGEON:  Thornton Park. Daphine Deutscher, MD., FACS   ANESTHESIA:  General.   DESCRIPTION OF PROCEDURE:  [name] is a 55 year old patient with the  aforementioned problems seen on CT scan. She was brought to OR 11, given  general anesthesia. The abdomen was prepped with chlorhexidine and draped  sterilely. A Foley catheter had previously been inserted. I entered the  abdomen in the left upper quadrant using an Optiview technique and then  insufflated. Eventually two 5 mm were placed. Bowel was stuck up into the  midline and I spent about an hour and a half tediously dissecting the  densely adherent small bowel stuck up in there. This required a harmonic  scalpel and sharp scissors. After using these multiple ports and multiple  techniques but using no monopolar cautery, I freed the bowel from the sac. I  then made a transverse incision and shelled out about a 5 inch in diameter  hernia sac and once it was completely freed from the surrounding tissue I  went ahead and amputated it at the fascial level. I put my finger in,  repaired a couple of defects inferiorly with figure-of-eight sutures of #0  Prolene. I then used a piece of 8 cm Ventralex mesh which popped into the  abdomen and was secured  around the perimeter with horizontal mattress  sutures of #0 Prolene. I then inserted the hernia tacker from inside and  tacked the perimeter of this to the fascia with the auto suture hernia  tacker. I then reinserted the scope and looked at this from the inside and  it was laying nice and flush to the anterior abdominal wall. While I had the  abdomen open, I surveyed the bowel and did not see any evidence of any  enterotomies, everything looked healthy. The wounds were injected with  Marcaine. I closed the hernia sac dead space with 4-0 Vicryl and then closed  the wounds with 4-0 Vicryl subcuticularly with Dermabond. The patient  tolerated the procedure well and was taken to the recovery room in  satisfactory condition.      Thornton Park Daphine Deutscher, MD  Electronically Signed    MBM/MEDQ  D:  03/09/2005  T:  03/10/2005  Job:  [id number]

## 2010-05-22 NOTE — Discharge Summary (Signed)
[facility]  Patient:    Brandi Mendez, Brandi Mendez Visit Number: [id number] MRN: [id number]          Service Type: GYN Location: 9300 [id number] Attending Physician:  Tammi Sou Dictated by:   Andrey Spearman Admit Date:  09/26/2000 Disc. Date: 10/09/00                             Discharge Summary  DISCHARGE DIAGNOSIS:          Tubo-ovarian abscess. It is multiloculated.  DISCHARGE MEDICATIONS:        1. Flagyl 500 mg one p.o. b.i.d. x 14 days.                               2. Doxycycline 100 mg one p.o. b.i.d. x 14 days.  PROCEDURES:                   CT scan showed a 7 x 9 cm multicystic mass suggestive of a tubo-ovarian abscess on the right.  BRIEF ADMISSION HISTORY AND PHYSICAL:                 This patient is a 55 year old black female who presented with a two-day history of right lower quadrant pain, intermittent spasms, feeling terrible, and decreased p.o. intake. At that time, she denied any vomiting or diarrhea but did she think she had been having a fever. Her past history was significant for only a spontaneous vaginal delivery in the past and a history of a laparoscopic cholecystectomy. Her initial exam revealed her temperature to be 101.0. Her abdominal exam showed her abdomen to be soft with good bowel sounds in all four quadrants but was tender with guarding in the right lower quadrant and her GU exam revealed a small, slightly tender uterus, right adnexa markedly tender with guarding. Initial exam showed a white count of 12.5, H&H within normal limits. Wet prep with few Trichomomas, few clue cells and a few white blood cells. Initial CT without contrast showed multiple _______  in the pelvis but no obvious appendicitis. Her initial urinalysis showed 11-20 red blood cells with granular casts but otherwise negative, and she was admitted for possible nephrolithiasis, Trichomonas and bacterial vaginosis.  HOSPITAL COURSE: #1 -  RIGHT TUBO-OVARIAN ABSCESS:  The patient was admitted initially for possible nephrolithiasis and started on Cefotan. She was also given Flagyl for positive Trichomonas. She, however, continued to spike significant fevers and the CT scan was felt to be inconclusive as far to what was the diagnosis. Therefore, she underwent a CT of the abdomen with contrast to further evaluate this and on this it showed a 7 x 9 cm multicystic mass suggestive of a TOA. She was therefore, on September 24, started on triple therapy including ampicillin, gentamicin, and clindamycin. During this time, she had some drop in her blood pressure, however, this improved with IV fluids and initiation of antibiotics. The patient continued on triple therapy antibiotics until October 02, 2000. During this time she continued to spike intermittent fevers as high as 103 with periods of time when she was afebrile. Slowly over the course of the hospitalization, her nausea and vomiting resolved. She had decreased pain. Her fever resolved and she began to feel well. As of October 03, 2000 she was started on imipenem for further treatment. She was therefore continued on 12 total  days of antibiotics. Upon discharge, she was nontender, she was afebrile, and eating and drinking normally. She will be discharged home to continue a further 14-day course of Flagyl and doxycycline and she will return to the [facility] in one week for further evaluation and discussion of followup. She was discharged home in stable condition. Dictated by:   Andrey Spearman Attending Physician:  Tammi Sou DD:  10/09/00 TD:  10/09/00 Job: [id number]

## 2010-06-20 ENCOUNTER — Emergency Department (HOSPITAL_COMMUNITY)
Admission: EM | Admit: 2010-06-20 | Discharge: 2010-06-20 | Disposition: A | Discharge status: Home / Self Care | Payer: Medicaid Other | Attending: Emergency Medicine | Admitting: Emergency Medicine

## 2010-06-20 ENCOUNTER — Emergency Department (HOSPITAL_COMMUNITY): Payer: Medicaid Other

## 2010-06-20 DIAGNOSIS — M5430 Sciatica, unspecified side: Secondary | ICD-10-CM | POA: Insufficient documentation

## 2010-06-20 DIAGNOSIS — M25559 Pain in unspecified hip: Secondary | ICD-10-CM | POA: Insufficient documentation

## 2010-06-20 DIAGNOSIS — M545 Low back pain, unspecified: Secondary | ICD-10-CM | POA: Insufficient documentation

## 2010-06-20 DIAGNOSIS — I10 Essential (primary) hypertension: Secondary | ICD-10-CM | POA: Insufficient documentation

## 2010-06-20 DIAGNOSIS — R079 Chest pain, unspecified: Secondary | ICD-10-CM | POA: Insufficient documentation

## 2010-06-20 DIAGNOSIS — R059 Cough, unspecified: Secondary | ICD-10-CM | POA: Insufficient documentation

## 2010-06-20 DIAGNOSIS — W19XXXA Unspecified fall, initial encounter: Secondary | ICD-10-CM | POA: Insufficient documentation

## 2010-06-20 DIAGNOSIS — R05 Cough: Secondary | ICD-10-CM | POA: Insufficient documentation

## 2010-06-20 DIAGNOSIS — H9190 Unspecified hearing loss, unspecified ear: Secondary | ICD-10-CM | POA: Insufficient documentation

## 2010-06-20 DIAGNOSIS — Z79899 Other long term (current) drug therapy: Secondary | ICD-10-CM | POA: Insufficient documentation

## 2010-06-20 DIAGNOSIS — E119 Type 2 diabetes mellitus without complications: Secondary | ICD-10-CM | POA: Insufficient documentation

## 2010-06-29 ENCOUNTER — Encounter: Payer: Self-pay | Admitting: Family Medicine

## 2010-06-29 ENCOUNTER — Ambulatory Visit (INDEPENDENT_AMBULATORY_CARE_PROVIDER_SITE_OTHER): Payer: Self-pay | Admitting: Family Medicine

## 2010-06-29 ENCOUNTER — Inpatient Hospital Stay (INDEPENDENT_AMBULATORY_CARE_PROVIDER_SITE_OTHER)
Admission: RE | Admit: 2010-06-29 | Discharge: 2010-06-29 | Disposition: A | Discharge status: Home / Self Care | Payer: Medicaid Other | Source: Ambulatory Visit | Attending: Emergency Medicine | Admitting: Emergency Medicine

## 2010-06-29 VITALS — BP 107/72 | HR 90 | Temp 97.6°F | Wt 180.0 lb

## 2010-06-29 DIAGNOSIS — IMO0002 Reserved for concepts with insufficient information to code with codable children: Secondary | ICD-10-CM

## 2010-06-29 DIAGNOSIS — L259 Unspecified contact dermatitis, unspecified cause: Secondary | ICD-10-CM

## 2010-06-29 DIAGNOSIS — N390 Urinary tract infection, site not specified: Secondary | ICD-10-CM | POA: Insufficient documentation

## 2010-06-29 DIAGNOSIS — R300 Dysuria: Secondary | ICD-10-CM

## 2010-06-29 DIAGNOSIS — N760 Acute vaginitis: Secondary | ICD-10-CM

## 2010-06-29 LAB — POCT UA - MICROSCOPIC ONLY

## 2010-06-29 LAB — POCT URINALYSIS DIPSTICK
Bilirubin, UA: NEGATIVE
Glucose, UA: NEGATIVE
Nitrite, UA: NEGATIVE
Urobilinogen, UA: 1
pH, UA: 5.5

## 2010-06-29 LAB — POCT WET PREP (WET MOUNT)

## 2010-06-29 MED ORDER — CEPHALEXIN 500 MG PO CAPS: 500.0000 mg | ORAL_CAPSULE | Freq: Two times a day (BID) | ORAL | Status: AC

## 2010-06-29 NOTE — Assessment & Plan Note (Signed)
Treat for uncomplicated cystitis

## 2010-06-29 NOTE — Progress Notes (Signed)
  Subjective:    Patient ID: Brandi Mendez, female    DOB: 1955-11-28, 55 y.o.   MRN: [id number]  HPI Dysuria and lower abd pain- for 2 weeks, occ constipation no blood in stools, no fever, no N/V, burning sensation with urination and urinary frequency +vaginal discharge, for weeks, sexually active no condom use  Review of Systems     Objective:   Physical Exam     GEN-NAD, alert and oriented    ABD- soft, TTP suprapubic region, no rebound no gaurding, no CVA tenderness, NABS   GU- yellow discharge from residual cervix and in valut, no blood seen, no pain with palpation, normal external genitalia    Assessment & Plan:

## 2010-06-29 NOTE — Assessment & Plan Note (Signed)
Will wait for wet prep/GC Chlamydia results

## 2010-06-29 NOTE — Patient Instructions (Signed)
We will call with your other labs Take the antibiotics as prescribed Use Condoms You should schedule a physical exam in August

## 2010-06-30 ENCOUNTER — Telehealth: Payer: Self-pay | Admitting: Family Medicine

## 2010-06-30 LAB — GC/CHLAMYDIA PROBE AMP, GENITAL: Chlamydia, DNA Probe: NEGATIVE

## 2010-06-30 NOTE — Telephone Encounter (Signed)
Pt advised of test results and recommmendation.

## 2010-07-21 ENCOUNTER — Encounter: Payer: Medicaid Other | Admitting: Internal Medicine

## 2010-07-28 ENCOUNTER — Ambulatory Visit (INDEPENDENT_AMBULATORY_CARE_PROVIDER_SITE_OTHER): Payer: Medicaid Other | Admitting: Internal Medicine

## 2010-07-28 ENCOUNTER — Encounter: Payer: Self-pay | Admitting: Internal Medicine

## 2010-07-28 VITALS — BP 140/86 | HR 102 | Temp 98.0°F | Wt 192.7 lb

## 2010-07-28 DIAGNOSIS — E876 Hypokalemia: Secondary | ICD-10-CM

## 2010-07-28 DIAGNOSIS — R3129 Other microscopic hematuria: Secondary | ICD-10-CM

## 2010-07-28 DIAGNOSIS — J45909 Unspecified asthma, uncomplicated: Secondary | ICD-10-CM

## 2010-07-28 DIAGNOSIS — E119 Type 2 diabetes mellitus without complications: Secondary | ICD-10-CM

## 2010-07-28 DIAGNOSIS — E785 Hyperlipidemia, unspecified: Secondary | ICD-10-CM

## 2010-07-28 DIAGNOSIS — K219 Gastro-esophageal reflux disease without esophagitis: Secondary | ICD-10-CM

## 2010-07-28 DIAGNOSIS — I10 Essential (primary) hypertension: Secondary | ICD-10-CM

## 2010-07-28 DIAGNOSIS — Z008 Encounter for other general examination: Secondary | ICD-10-CM

## 2010-07-28 LAB — GLUCOSE, CAPILLARY: Glucose-Capillary: 133 mg/dL — ABNORMAL HIGH (ref 70–99)

## 2010-07-28 MED ORDER — ALBUTEROL SULFATE HFA 108 (90 BASE) MCG/ACT IN AERS: 1.0000 | INHALATION_SPRAY | Freq: Two times a day (BID) | RESPIRATORY_TRACT | Status: DC | PRN

## 2010-07-28 MED ORDER — METFORMIN HCL 1000 MG PO TABS: 1000.0000 mg | ORAL_TABLET | Freq: Two times a day (BID) | ORAL | Status: DC

## 2010-07-28 MED ORDER — OMEPRAZOLE 40 MG PO CPDR: 40.0000 mg | DELAYED_RELEASE_CAPSULE | Freq: Every day | ORAL | Status: DC

## 2010-07-28 MED ORDER — PRAVASTATIN SODIUM 40 MG PO TABS: 40.0000 mg | ORAL_TABLET | Freq: Every day | ORAL | Status: DC

## 2010-07-28 MED ORDER — GEMFIBROZIL 600 MG PO TABS: 600.0000 mg | ORAL_TABLET | Freq: Two times a day (BID) | ORAL | Status: DC

## 2010-07-28 MED ORDER — LISINOPRIL-HYDROCHLOROTHIAZIDE 20-12.5 MG PO TABS: 1.0000 | ORAL_TABLET | Freq: Every day | ORAL | Status: DC

## 2010-07-28 NOTE — Patient Instructions (Signed)
Please, follow up with an eye exam. Please, call with any questions and follow up in 6 months.

## 2010-07-28 NOTE — Progress Notes (Signed)
  Subjective:    Patient ID: Janice Massey, female    DOB: 1955-12-25, 55 y.o.   MRN: [id number]  HPI  1 follow up appointment on diabetes mellitus. Takes all her medications as instructed. Denies any side effects or hypoglycemia. Checks her CBGs daily denies any blood glucose readings above 200. Patient is due for a funduscopic exam. Patient is up-to-date on her foot exam, urine microalbumin, nutrition counseling, mammogram, colonoscopy, and vaccinations. 2. Patient requests refill for all her medications.  Review of Systems  Constitutional: Negative.   Eyes: Negative for pain and visual disturbance.  Respiratory: Negative.   Cardiovascular: Negative.   Gastrointestinal: Negative.   Genitourinary: Negative for urgency and frequency.  Musculoskeletal: Negative.   Neurological: Negative.   Hematological: Negative.   Psychiatric/Behavioral: Negative for behavioral problems, dysphoric mood, decreased concentration and agitation.        Objective:   Physical Exam General: Vital signs reviewed and noted. Well-developed, well-nourished, in no acute distress; alert, appropriate and cooperative throughout examination.  Head: Normocephalic, atraumatic.  Neck: No deformities, masses, or tenderness noted.  Lungs:  Normal respiratory effort. Clear to auscultation BL without crackles or wheezes.  Heart: RRR. S1 and S2 normal without gallop, murmur, or rubs.  Abdomen:  BS normoactive. Soft, Nondistended, non-tender.  No masses or organomegaly.  Extremities: No pretibial edema.          Assessment & Plan:

## 2010-07-28 NOTE — Assessment & Plan Note (Signed)
Controlled. No orthostatism or side-effects with her medications. Increased CV exercise for weight management. No changes in therapy.

## 2010-07-28 NOTE — Assessment & Plan Note (Signed)
Excellent control with HGBA1C of 6.5 today. Diet, exercise, foot care, eye exam reviewed with the patient. Will follow up in 6 months.

## 2010-08-21 ENCOUNTER — Ambulatory Visit: Payer: Self-pay | Admitting: Family Medicine

## 2010-09-03 ENCOUNTER — Encounter: Payer: Self-pay | Admitting: Family Medicine

## 2010-09-18 ENCOUNTER — Encounter: Payer: Self-pay | Admitting: Family Medicine

## 2010-09-18 ENCOUNTER — Ambulatory Visit (INDEPENDENT_AMBULATORY_CARE_PROVIDER_SITE_OTHER): Payer: Self-pay | Admitting: Family Medicine

## 2010-09-18 DIAGNOSIS — N898 Other specified noninflammatory disorders of vagina: Secondary | ICD-10-CM

## 2010-09-18 DIAGNOSIS — Z124 Encounter for screening for malignant neoplasm of cervix: Secondary | ICD-10-CM

## 2010-09-18 DIAGNOSIS — Z23 Encounter for immunization: Secondary | ICD-10-CM

## 2010-09-18 DIAGNOSIS — N760 Acute vaginitis: Secondary | ICD-10-CM

## 2010-09-18 MED ORDER — CEFTRIAXONE SODIUM 250 MG IJ SOLR
250.0000 mg | Freq: Once | INTRAMUSCULAR | Status: AC
  Administered 2010-09-18: 250 mg via INTRAMUSCULAR

## 2010-09-18 MED ORDER — AZITHROMYCIN 1 G PO PACK
1.0000 g | PACK | Freq: Once | ORAL | Status: AC
  Administered 2010-09-18: 1 g via ORAL

## 2010-09-18 NOTE — Progress Notes (Signed)
  Subjective:    Patient ID: Brandi Mendez, female    DOB: 08-Jan-1955, 55 y.o.   MRN: [id number]  HPI Dysuria and vaginal discharge x 1-2 weeks. No fever, mild abdominal pain, N/V/D. This has occurred in the past per pt. Pt states that she was seen for similar sxs 1-2 months ago. Pt also reports hx/o gonorrhea/chlamydia in the past. Vaginal discharge non foul smelling.  Review of Systems See HPI    Objective:   Physical Exam Gen: up in chair, NAD HEENT: NCAT, EOMI, TMs clear bilaterally CV: RRR, no murmurs auscultated PULM: CTAB, no wheezes, rales, rhoncii ABD: S/NT/+ bowel sounds GU: normal external genitalia. Copious amounts of vaginal discharge, vaginal cuff palpated on bimanual exam, no cervix/cerevical os visualized on speculum exam EXT: 2+ peripheral pulses   Assessment & Plan:

## 2010-09-18 NOTE — Patient Instructions (Addendum)
It was good to see you today  I am culturing your urine for infection I am treating you for possible sexual infections We are setting you up for colonoscopy  Come back to see me in 6 weeks for a follow up visit.  Call with any questions, God Bless,  Doree Albee MD

## 2010-09-19 DIAGNOSIS — N898 Other specified noninflammatory disorders of vagina: Secondary | ICD-10-CM | POA: Insufficient documentation

## 2010-09-19 LAB — GC/CHLAMYDIA PROBE AMP, GENITAL: Chlamydia, DNA Probe: NEGATIVE

## 2010-09-19 NOTE — Assessment & Plan Note (Signed)
Noted copious amounts of vaginal discharge on speculum exam. No noted cervical os or cervical stump on GU exam. GC/Chl sample attempted but unsure if this will be truly positive. Will cover for GC/Chl with rocephin and azithromycin. Will send for wet prep and ua to assess need for further treatment.

## 2010-09-29 LAB — URINALYSIS, ROUTINE W REFLEX MICROSCOPIC
Bilirubin Urine: NEGATIVE
Glucose, UA: NEGATIVE
Specific Gravity, Urine: >1.03 — ABNORMAL HIGH
pH: 5.5

## 2010-09-29 LAB — URINE MICROSCOPIC-ADD ON: WBC, UA: NONE SEEN

## 2010-09-29 LAB — DIFFERENTIAL
Basophils Absolute: 0
Basophils Relative: 1
Lymphocytes Relative: 37
Monocytes Absolute: 0.5
Monocytes Relative: 5
Neutro Abs: 4.8
Neutrophils Relative %: 56

## 2010-09-29 LAB — COMPREHENSIVE METABOLIC PANEL
Albumin: 3.8
Alkaline Phosphatase: 61
BUN: 16
Creatinine, Ser: 0.75
Glucose, Bld: 109 — ABNORMAL HIGH
Potassium: 3.5
Total Bilirubin: 0.6
Total Protein: 6.7

## 2010-09-29 LAB — CBC
HCT: 36.6
Hemoglobin: 12.6
MCV: 94.8
Platelets: 270
RDW: 12.9

## 2010-09-29 LAB — WET PREP, GENITAL
Clue Cells Wet Prep HPF POC: NONE SEEN
Trich, Wet Prep: NONE SEEN
Yeast Wet Prep HPF POC: NONE SEEN

## 2010-10-05 LAB — POCT URINALYSIS DIP (DEVICE)
Ketones, ur: NEGATIVE
Protein, ur: NEGATIVE
Urobilinogen, UA: 0.2
pH: 7

## 2010-10-15 ENCOUNTER — Telehealth: Payer: Self-pay | Admitting: Dietician

## 2010-10-15 NOTE — Telephone Encounter (Signed)
Offered CDE visit for flu vaccine and foot exam. Sister will call back to let us know if Vinaya Sancho wants to schedule a visit.

## 2010-10-16 LAB — URINALYSIS, ROUTINE W REFLEX MICROSCOPIC
Bilirubin Urine: NEGATIVE
Nitrite: NEGATIVE
Specific Gravity, Urine: 1.007
Urobilinogen, UA: 0.2

## 2010-10-16 LAB — COMPREHENSIVE METABOLIC PANEL
BUN: 9
CO2: 26
Chloride: 99
Creatinine, Ser: 0.93
GFR calc non Af Amer: >60
Total Bilirubin: 1

## 2010-10-16 LAB — POCT PREGNANCY, URINE
Operator id: 196461
Preg Test, Ur: NEGATIVE

## 2010-10-16 LAB — LIPASE, BLOOD: Lipase: 18

## 2010-10-16 LAB — DIFFERENTIAL
Basophils Absolute: 0
Lymphocytes Relative: 24
Monocytes Relative: 6
Neutro Abs: 5.5

## 2010-10-16 LAB — CBC
HCT: 39.6
MCHC: 34.1
MCV: 86.6
Platelets: 287
RBC: 4.57
WBC: 8

## 2010-10-19 ENCOUNTER — Ambulatory Visit (HOSPITAL_COMMUNITY)
Admission: RE | Admit: 2010-10-19 | Discharge: 2010-10-19 | Disposition: A | Discharge status: Home / Self Care | Payer: Self-pay | Source: Ambulatory Visit | Attending: Family Medicine | Admitting: Family Medicine

## 2010-10-19 ENCOUNTER — Ambulatory Visit (INDEPENDENT_AMBULATORY_CARE_PROVIDER_SITE_OTHER): Payer: Self-pay | Admitting: Family Medicine

## 2010-10-19 ENCOUNTER — Encounter: Payer: Self-pay | Admitting: Family Medicine

## 2010-10-19 VITALS — BP 102/68 | HR 60 | Ht 65.0 in | Wt 186.7 lb

## 2010-10-19 DIAGNOSIS — I498 Other specified cardiac arrhythmias: Secondary | ICD-10-CM | POA: Insufficient documentation

## 2010-10-19 DIAGNOSIS — R079 Chest pain, unspecified: Secondary | ICD-10-CM | POA: Insufficient documentation

## 2010-10-19 DIAGNOSIS — K219 Gastro-esophageal reflux disease without esophagitis: Secondary | ICD-10-CM

## 2010-10-19 DIAGNOSIS — R0789 Other chest pain: Secondary | ICD-10-CM

## 2010-10-19 MED ORDER — OMEPRAZOLE 20 MG PO CPDR: 20.0000 mg | DELAYED_RELEASE_CAPSULE | Freq: Every day | ORAL | Status: DC | PRN

## 2010-10-19 NOTE — Patient Instructions (Signed)
Chest Pain (Nonspecific) It is often hard to give a specific diagnosis for the cause of chest pain. There is always a chance that your pain could be related to something serious, such as a heart attack or a blood clot in the lungs. You need to follow up with your caregiver for further evaluation. CAUSES  Heartburn.   Pneumonia or bronchitis.   Anxiety and stress.   Inflammation around your heart (pericarditis) or lung (pleuritis or pleurisy).   A blood clot in the lung.   A collapsed lung (pneumothorax). It can develop suddenly on its own (spontaneous pneumothorax) or from injury (trauma) to the chest.  The chest wall is composed of bones, muscles, and cartilage. Any of these can be the source of the pain.  The bones can be bruised by injury.   The muscles or cartilage can be strained by coughing or overwork.   The cartilage can be affected by inflammation and become sore (costochondritis).  DIAGNOSIS Lab tests or other studies, such as X-rays, an EKG, stress testing, or cardiac imaging, may be needed to find the cause of your pain.   TREATMENT  Treatment depends on what may be causing your chest pain. Treatment may include:   Acid blockers for heartburn.   Anti-inflammatory medicine.      Pain medicine for inflammatory conditions.   Antibiotics if an infection is present.       You may be advised to change lifestyle habits. This includes stopping smoking and avoiding caffeine and chocolate.   You may be advised to keep your head raised (elevated) when sleeping. This reduces the chance of acid going backward from your stomach into your esophagus.   Most of the time, nonspecific chest pain will improve within 2 to 3 days with rest and mild pain medicine.  HOME CARE INSTRUCTIONS  If antibiotics were prescribed, take the full amount even if you start to feel better.   For the next few days, avoid physical activities that bring on chest pain. Continue physical activities as  directed.   Do not smoke cigarettes or drink alcohol until your symptoms are gone.   Only take over-the-counter or prescription medicine for pain, discomfort, or fever as directed by your caregiver.   Follow your caregiver's suggestions for further testing if your chest pain does not go away.   Keep any follow-up appointments you made. If you do not go to an appointment, you could develop lasting (chronic) problems with pain. If there is any problem keeping an appointment, you must call to reschedule.  SEEK MEDICAL CARE IF:  You think you are having problems from the medicine you are taking. Read your medicine instructions carefully.   Your chest pain does not go away, even after treatment.   You develop a rash with blisters on your chest.  SEEK IMMEDIATE MEDICAL CARE IF:  You have increased chest pain or pain that spreads to your arm, neck, jaw, back, or belly (abdomen).   You develop shortness of breath, an increasing cough, or you are coughing up blood.   You have severe back or abdominal pain, feel sick to your stomach (nauseous) or throw up (vomit).   You develop severe weakness, fainting, or chills.   You have an oral temperature above 100.4, not controlled by medicine.  THIS IS AN EMERGENCY. Do not wait to see if the pain will go away. Get medical help at once. Call your local emergency services  (911 in [place] Do not drive yourself to  the hospital. MAKE SURE YOU:  Understand these instructions.   Will watch your condition.   Will get help right away if you are not doing well or get worse.  Document Released: 09/30/2004 Document Re-Released: 03/17/2009 [facility] Patient Information 2011 [place], [place].

## 2010-10-19 NOTE — Progress Notes (Signed)
  Subjective:    Brandi Mendez is a 55 y.o. female who presents for evaluation of chest pain. Onset was 1 hour ago. Symptoms have improved since that time. The patient describes the pain as pressure and radiates to the left arm. Patient rates pain as a 5/10 in intensity. Associated symptoms are: chest pressure/discomfort and palpitations. Aggravating factors are: none. Alleviating factors are: none. Patient's cardiac risk factors are: sedentary lifestyle. Patient's risk factors for DVT/PE: none. Previous cardiac testing: none.  The following portions of the patient's history were reviewed and updated as appropriate: allergies, current medications, past family history, past medical history, past social history, past surgical history and problem list.  Review of Systems Pertinent items are noted in HPI.    Objective:    BP 102/68  Pulse 60  Ht 5\' 5"  (1.651 m)  Wt 186 lb 11.2 oz (84.687 kg)  BMI 31.07 kg/m2 General appearance: alert and cooperative Lungs: clear to auscultation bilaterally Extremities: extremities normal, atraumatic, no cyanosis or edema Pulses: 2+ and symmetric HEART: RRR no murmur Cardiographics ECG: normal sinus rhythm, no blocks or conduction defects, no ischemic changes  Imaging Chest x-ray: not indicated    Assessment:    Chest pain, suspected etiology: GERD    Plan:      GERD most likely cause of symptoms. Normal EKG.  Because of left arm radiation will refer for stress testing.

## 2010-10-22 LAB — CBC
HCT: 33.5 — ABNORMAL LOW
Hemoglobin: 11.4 — ABNORMAL LOW
MCV: 94.2
WBC: 8.3

## 2010-10-22 LAB — BASIC METABOLIC PANEL
Chloride: 111
GFR calc non Af Amer: >60
Potassium: 3.4 — ABNORMAL LOW
Sodium: 145

## 2010-10-22 LAB — DIFFERENTIAL
Eosinophils Absolute: 0.1
Eosinophils Relative: 1
Lymphocytes Relative: 35
Lymphs Abs: 2.9
Monocytes Absolute: 0.5
Monocytes Relative: 6

## 2010-10-22 LAB — POCT CARDIAC MARKERS: Troponin i, poc: <0.05

## 2010-11-04 ENCOUNTER — Other Ambulatory Visit: Payer: Self-pay | Admitting: *Deleted

## 2010-11-04 DIAGNOSIS — R3129 Other microscopic hematuria: Secondary | ICD-10-CM

## 2010-11-04 DIAGNOSIS — J45909 Unspecified asthma, uncomplicated: Secondary | ICD-10-CM

## 2010-11-04 DIAGNOSIS — Z008 Encounter for other general examination: Secondary | ICD-10-CM

## 2010-11-04 DIAGNOSIS — E119 Type 2 diabetes mellitus without complications: Secondary | ICD-10-CM

## 2010-11-04 DIAGNOSIS — K219 Gastro-esophageal reflux disease without esophagitis: Secondary | ICD-10-CM

## 2010-11-04 DIAGNOSIS — I10 Essential (primary) hypertension: Secondary | ICD-10-CM

## 2010-11-04 DIAGNOSIS — E876 Hypokalemia: Secondary | ICD-10-CM

## 2010-11-04 DIAGNOSIS — E785 Hyperlipidemia, unspecified: Secondary | ICD-10-CM

## 2010-11-04 MED ORDER — METFORMIN HCL 1000 MG PO TABS: 1000.0000 mg | ORAL_TABLET | Freq: Two times a day (BID) | ORAL | Status: DC

## 2010-11-04 NOTE — Telephone Encounter (Signed)
Called to [facility].  Angelina Ok, RN 11/04/2010 3:28 PM

## 2010-11-04 NOTE — Telephone Encounter (Signed)
Would you like to change the amount to 60.  Pt only getting 30 for a 2 times a day dosing.

## 2010-11-09 ENCOUNTER — Ambulatory Visit (INDEPENDENT_AMBULATORY_CARE_PROVIDER_SITE_OTHER): Payer: Medicaid Other | Admitting: Internal Medicine

## 2010-11-09 ENCOUNTER — Ambulatory Visit (INDEPENDENT_AMBULATORY_CARE_PROVIDER_SITE_OTHER): Payer: Medicaid Other | Admitting: Dietician

## 2010-11-09 ENCOUNTER — Encounter: Payer: Self-pay | Admitting: Dietician

## 2010-11-09 ENCOUNTER — Other Ambulatory Visit: Payer: Self-pay | Admitting: Internal Medicine

## 2010-11-09 ENCOUNTER — Ambulatory Visit (HOSPITAL_COMMUNITY)
Admission: RE | Admit: 2010-11-09 | Discharge: 2010-11-09 | Disposition: A | Discharge status: Home / Self Care | Payer: Medicaid Other | Source: Ambulatory Visit | Attending: Internal Medicine | Admitting: Internal Medicine

## 2010-11-09 ENCOUNTER — Encounter: Payer: Self-pay | Admitting: Internal Medicine

## 2010-11-09 VITALS — BP 150/87 | HR 80 | Temp 98.7°F | Ht 68.0 in | Wt 200.7 lb

## 2010-11-09 DIAGNOSIS — M79645 Pain in left finger(s): Secondary | ICD-10-CM

## 2010-11-09 DIAGNOSIS — M79609 Pain in unspecified limb: Secondary | ICD-10-CM | POA: Insufficient documentation

## 2010-11-09 DIAGNOSIS — E119 Type 2 diabetes mellitus without complications: Secondary | ICD-10-CM

## 2010-11-09 DIAGNOSIS — I10 Essential (primary) hypertension: Secondary | ICD-10-CM

## 2010-11-09 DIAGNOSIS — R609 Edema, unspecified: Secondary | ICD-10-CM | POA: Insufficient documentation

## 2010-11-09 DIAGNOSIS — Z23 Encounter for immunization: Secondary | ICD-10-CM

## 2010-11-09 LAB — COMPREHENSIVE METABOLIC PANEL
AST: 21 U/L (ref 0–37)
Albumin: 4.5 g/dL (ref 3.5–5.2)
Alkaline Phosphatase: 67 U/L (ref 39–117)
BUN: 14 mg/dL (ref 6–23)
Creat: 0.83 mg/dL (ref 0.50–1.10)
Glucose, Bld: 172 mg/dL — ABNORMAL HIGH (ref 70–99)
Potassium: 3.6 mEq/L (ref 3.5–5.3)

## 2010-11-09 LAB — POCT GLYCOSYLATED HEMOGLOBIN (HGB A1C): Hemoglobin A1C: 6.8

## 2010-11-09 NOTE — Assessment & Plan Note (Signed)
Lab Results  Component Value Date   NA 141 03/31/2010   K 3.7 03/31/2010   CL 103 03/31/2010   CO2 26 03/31/2010   BUN 16 03/31/2010   CREATININE 0.79 03/31/2010   CREATININE 0.71 01/01/2010    BP Readings from Last 3 Encounters:  11/09/10 150/87  07/28/10 140/86  03/31/10 158/88    Assessment: Hypertension control:  mildly elevated  Progress toward goals:  unchanged Barriers to meeting goals:  Uncertain, patient states she is on a new antihypertensive prescribed by cards, but do not know which one  Plan: Hypertension treatment:  continue current medications Have patient bring medication bottle to next appt. Will not start a new medication until we find out what she is prescribed.

## 2010-11-09 NOTE — Assessment & Plan Note (Signed)
Likely tendonitis of ring finer, trigger finger. Patient would like xray to rule out fracture. There is slight swelling, and slight increased warmth, but range of motion is normal. Will check xray, but doubt it will be abnormal.

## 2010-11-09 NOTE — Assessment & Plan Note (Signed)
Lab Results  Component Value Date   HGBA1C 6.8 11/09/2010   HGBA1C 7.3 08/06/2009   CREATININE 0.79 03/31/2010   CREATININE 0.71 01/01/2010   MICROALBUR 9.09* 08/06/2009   MICRALBCREAT 91.2* 08/06/2009   CHOL 137 03/31/2010   HDL 54 03/31/2010   TRIG 89 03/31/2010    Last eye exam and foot exam:    Component Value Date/Time   HMDIABEYEEXA no diabetic retinopathy 08/21/2009   HMDIABFOOTEX done 10/07/2009    Assessment: Diabetes control: controlled Progress toward goals: at goal Barriers to meeting goals: no barriers identified  Plan: Diabetes treatment: continue current medications Refer to: none Instruction/counseling given: reminded to get eye exam, reminded to bring blood glucose meter & log to each visit and reminded to bring medications to each visit

## 2010-11-09 NOTE — Progress Notes (Signed)
  Subjective:    Patient ID: Janice Massey, female    DOB: 25-Apr-1955, 55 y.o.   MRN: [id number]  HPI  [name] is a 55 year old woman with pmh significant for DM, HLD, HTN, Asthma who presents for general check-up.   1.) DM - Checks CBGs rarely, but states when she does that they are within normal range. Denies any hypoglycemic sx such as lightheadedness or dizziness.   2.) HTN - Lisinopril/HCTZ discontinued by cards and recently started on a new medication that she cannot recall the exact name and did not bring the bottle in.  3.) Finger pain - left hand ring finger is painful for 4 months, swells up, and causes more pain at night, and is very stiff upon wakening. Difficulty with extension.   Patient has no other complaints or concerns today. She denies chest pain, cough, sob, headache, N/V, changes in abdominal and urinary character.   Review of Systems  All other systems reviewed and are negative.       Objective:   Physical Exam  Constitutional: She appears well-developed.  HENT:  Head: Normocephalic.  Eyes: Pupils are equal, round, and reactive to light.  Neck: Normal range of motion. Neck supple.  Cardiovascular: Normal rate and regular rhythm.   Pulmonary/Chest: Effort normal and breath sounds normal.  Abdominal: Soft. Bowel sounds are normal.  Musculoskeletal: Normal range of motion.       Difficulty with extension of left ring finger  Psychiatric: She has a normal mood and affect.          Assessment & Plan:

## 2010-11-09 NOTE — Progress Notes (Signed)
Diabetes Self-Management Training (DSMT)  Initial Visit  11/09/2010 [name], identified by name and date of birth, is a 55 y.o. female with Type 2 Diabetes. Year of diabetes diagnosis: 04/05/2002 Other persons present: interpreter  ASSESSMENT Patient concerns are annual diabetes visit.  There were no vitals taken for this visit. There is no height or weight on file to calculate BMI. Lab Results  Component Value Date   LDLCALC 65 03/31/2010   Lab Results  Component Value Date   HGBA1C 6.8 11/09/2010   Medication Nutrition Monitor: has had freestyle lite and one touch ultra in past  Labs reviewed.  DIABETES BUNDLE: A1C in past 6 months? Yes.  Less than 7%? Yes LDL in past year? Yes.  Less than 100 mg/dL? Yes Microalbumin ratio in past year? No. Patient taking ACE or ARB? Yes. Blood pressure less than 130/80? No.  Sent note to MD. Foot exam in last year? Yes. Eye exam in past year? Yes. Tobacco use? No. Pneumovax? Yes Flu vaccine? Yes Asprin? No  Family history of diabetes: Yes Support systems: friends and Family Special needs: interpreter for deaf and visuals and demonstrations, teach back Patients belief/attitude about diabetes: Diabetes can be controlled. Self foot exams daily: Yes Diabetes Complications: None Prior DM Education: Yes   Medications See Medications list.  Has adequate knowledge   Exercise Plan Doing walking for 30 minutesa day.   Self-Monitoring Frequency of testing: not testing  Hyperglycemia: No Hypoglycemia: No   Meal Planning Knowledgable   Assessment comments: here for foot exam, flu vaccine and A1C, answered toher questions about what A1C means and what may affect it    INDIVIDUAL DIABETES EDUCATION PLAN:  Monitoring Chronic complications _______________________________________________________________________  Intervention TOPICS COVERED TODAY:  Monitoring  Interpreting lab values - A1C, lipid, urine microalbumina. Chronic  complications  Assessed and discussed foot care and prevention of foot problems  PATIENTS GOALS/PLAN (copy and paste in patient instructions so patient receives a copy): 1.  Learning Objective:       Know what should be done yearly to care for diabetes 2.  Behavioral Objective:         Monitoring: To identify blood glucose trends, I will know my A1C and what affects it Never 0% Reducing Risk: To decrease the risk for complications, I will do foot check daily  Always 100%  Personalized Follow-Up Plan for Ongoing Self Management Support:  Doctor's Office, friends, family and CDE visits ______________________________________________________________________   Outcomes Expected outcomes: Demonstrated interest in learning.Expect positive changes in lifestyle.  Self-care Barriers: Hard of hearing  Education material provided: written about diabetes care for chronic complications  Patient to contact team via Phone if problems or questions.  Time in: 1345     Time out: 1445  Future DSMT - yearly   P[place]ler, Janice Massey

## 2010-11-09 NOTE — Patient Instructions (Signed)
Please let the clinic know of any new medications that have been started.  Please call clinic to inform them of new blood pressure medicine. Please follow up with xray of finger.  Please follow up in 3 months.

## 2010-11-10 LAB — MICROALBUMIN / CREATININE URINE RATIO
Creatinine, Urine: 197.2 mg/dL
Microalb Creat Ratio: 131.6 mg/g — ABNORMAL HIGH (ref 0.0–30.0)
Microalb, Ur: 25.95 mg/dL — ABNORMAL HIGH (ref 0.00–1.89)

## 2010-12-04 ENCOUNTER — Ambulatory Visit (INDEPENDENT_AMBULATORY_CARE_PROVIDER_SITE_OTHER): Payer: Self-pay | Admitting: Family Medicine

## 2010-12-04 ENCOUNTER — Encounter: Payer: Medicaid Other | Admitting: Internal Medicine

## 2010-12-04 DIAGNOSIS — K219 Gastro-esophageal reflux disease without esophagitis: Secondary | ICD-10-CM

## 2010-12-04 DIAGNOSIS — N898 Other specified noninflammatory disorders of vagina: Secondary | ICD-10-CM

## 2010-12-04 DIAGNOSIS — R12 Heartburn: Secondary | ICD-10-CM

## 2010-12-04 DIAGNOSIS — R300 Dysuria: Secondary | ICD-10-CM

## 2010-12-04 LAB — POCT WET PREP (WET MOUNT): Yeast Wet Prep HPF POC: NEGATIVE

## 2010-12-04 LAB — POCT URINALYSIS DIPSTICK
Ketones, UA: NEGATIVE
Spec Grav, UA: 1.025
pH, UA: 7

## 2010-12-04 LAB — POCT UA - MICROSCOPIC ONLY: Epithelial cells, urine per micros: >20

## 2010-12-04 MED ORDER — OMEPRAZOLE 20 MG PO CPDR: 20.0000 mg | DELAYED_RELEASE_CAPSULE | Freq: Every day | ORAL | Status: DC

## 2010-12-04 NOTE — Progress Notes (Signed)
  Subjective:    Patient ID: Brandi Mendez, female    DOB: 1955-04-16, 55 y.o.   MRN: [id number]  HPI Pt is here for follow up visit on abdominal pain. Pt was seen approximately 1 month ago and placed on prilosec for reflux. Today,pt states that abdominal pain has persisted since last clinical visit. Pain is mainly in epigastric region with some referred esophageal and CP. Pt states that she has not taken prilosec as previously prescribed. Pain is worse with after meals and when laying down.    Pt also reports of recurrence of vaginal discharge and mild dysuria. Pt was recently treated with broad spectrum abx for GC/Chl. Vaginal discharge has persisted. No fevers, nausea, vomiting.    Review of Systems See HPI     Objective:   Physical Exam Gen: up in chair, NAD CV: RRR, no murmurs auscultated PULM: CTAB, no wheezes, rales, rhoncii ABD: mild epigastric TTP, mild suprapubic tenderness EXT: 2+ peripheral pulses         Assessment & Plan:

## 2010-12-04 NOTE — Patient Instructions (Signed)
He was good to see you again Be sure to take the Prilosec for your heartburn If you urinalysis shows anything indicative of infection we will contact you Come back to see me after the new year Call with any questions,  God Bless, Doree Albee MD   Gastroesophageal Reflux Disease, Adult Gastroesophageal reflux disease (GERD) happens when acid from your stomach flows up into the esophagus. When acid comes in contact with the esophagus, the acid causes soreness (inflammation) in the esophagus. Over time, GERD may create small holes (ulcers) in the lining of the esophagus. CAUSES   Increased body weight. This puts pressure on the stomach, making acid rise from the stomach into the esophagus.   Smoking. This increases acid production in the stomach.   Drinking alcohol. This causes decreased pressure in the lower esophageal sphincter (valve or ring of muscle between the esophagus and stomach), allowing acid from the stomach into the esophagus.   Late evening meals and a full stomach. This increases pressure and acid production in the stomach.   A malformed lower esophageal sphincter.  Sometimes, no cause is found. SYMPTOMS   Burning pain in the lower part of the mid-chest behind the breastbone and in the mid-stomach area. This may occur twice a week or more often.   Trouble swallowing.   Sore throat.   Dry cough.   Asthma-like symptoms including chest tightness, shortness of breath, or wheezing.  DIAGNOSIS  Your caregiver may be able to diagnose GERD based on your symptoms. In some cases, X-rays and other tests may be done to check for complications or to check the condition of your stomach and esophagus. TREATMENT  Your caregiver may recommend over-the-counter or prescription medicines to help decrease acid production. Ask your caregiver before starting or adding any new medicines.  HOME CARE INSTRUCTIONS   Change the factors that you can control. Ask your caregiver for guidance  concerning weight loss, quitting smoking, and alcohol consumption.   Avoid foods and drinks that make your symptoms worse, such as:   Caffeine or alcoholic drinks.   Chocolate.   Peppermint or mint flavorings.   Garlic and onions.   Spicy foods.   Citrus fruits, such as oranges, lemons, or limes.   Tomato-based foods such as sauce, chili, salsa, and pizza.   Fried and fatty foods.   Avoid lying down for the 3 hours prior to your bedtime or prior to taking a nap.   Eat small, frequent meals instead of large meals.   Wear loose-fitting clothing. Do not wear anything tight around your waist that causes pressure on your stomach.   Raise the head of your bed 6 to 8 inches with wood blocks to help you sleep. Extra pillows will not help.   Only take over-the-counter or prescription medicines for pain, discomfort, or fever as directed by your caregiver.   Do not take aspirin, ibuprofen, or other nonsteroidal anti-inflammatory drugs (NSAIDs).  SEEK IMMEDIATE MEDICAL CARE IF:   You have pain in your arms, neck, jaw, teeth, or back.   Your pain increases or changes in intensity or duration.   You develop nausea, vomiting, or sweating (diaphoresis).   You develop shortness of breath, or you faint.   Your vomit is green, yellow, black, or looks like coffee grounds or blood.   Your stool is red, bloody, or black.  These symptoms could be signs of other problems, such as heart disease, gastric bleeding, or esophageal bleeding. MAKE SURE YOU:   Understand these  instructions.   Will watch your condition.   Will get help right away if you are not doing well or get worse.  Document Released: 09/30/2004 Document Revised: 09/02/2010 Document Reviewed: 07/10/2010 [facility] Patient Information 2012 [place], [place].

## 2010-12-06 NOTE — Assessment & Plan Note (Signed)
Encouraged compliance with medication.

## 2010-12-06 NOTE — Assessment & Plan Note (Addendum)
Will check wet prep today. UA inconclusive. Will treat depending on results.

## 2010-12-07 ENCOUNTER — Other Ambulatory Visit: Payer: Self-pay | Admitting: Family Medicine

## 2010-12-07 DIAGNOSIS — B9689 Other specified bacterial agents as the cause of diseases classified elsewhere: Secondary | ICD-10-CM

## 2010-12-07 MED ORDER — METRONIDAZOLE 500 MG PO TABS: 500.0000 mg | ORAL_TABLET | Freq: Two times a day (BID) | ORAL | Status: DC

## 2011-01-06 ENCOUNTER — Encounter (HOSPITAL_COMMUNITY): Payer: Self-pay

## 2011-01-06 ENCOUNTER — Emergency Department (INDEPENDENT_AMBULATORY_CARE_PROVIDER_SITE_OTHER)
Admission: EM | Admit: 2011-01-06 | Discharge: 2011-01-06 | Disposition: A | Discharge status: Home / Self Care | Payer: Self-pay | Source: Home / Self Care

## 2011-01-06 DIAGNOSIS — H8110 Benign paroxysmal vertigo, unspecified ear: Secondary | ICD-10-CM

## 2011-01-06 MED ORDER — MECLIZINE HCL 25 MG PO TABS: 25.0000 mg | ORAL_TABLET | Freq: Three times a day (TID) | ORAL | Status: AC | PRN

## 2011-01-06 NOTE — ED Notes (Signed)
Patient states she has been having dizzy spells since yesterday. Spells start whenever she changes position. Denies pain, denies n/v/d, states she is drinking and urinating normally ; NAD

## 2011-01-06 NOTE — ED Provider Notes (Signed)
History     CSN: [id number]  Arrival date & time 01/06/11  1021   None     Chief Complaint  Patient presents with  . Dizziness    (Consider location/radiation/quality/duration/timing/severity/associated sxs/prior treatment) HPI Comments: Pt presents with c/o dizziness. Dizziness began yesterday. No previous hx of same. She describes the dizziness as "feeling swimmy headed" and this occurs only when she stands up or begins to walk after sitting or lying down. The dizziness lasts for a brief period of time then improves. No headache, nausea, vomiting, chest discomfort, palpitation, or dyspnea. She denies current or recent URI symptoms. She has no numbness, tingling or weakness.  The history is provided by the patient.    History reviewed. No pertinent past medical history.  History reviewed. No pertinent past surgical history.  History reviewed. No pertinent family history.  History  Substance Use Topics  . Smoking status: Never Smoker   . Smokeless tobacco: Not on file  . Alcohol Use: Not on file    OB History    Grav Para Term Preterm Abortions TAB SAB Ect Mult Living                  Review of Systems  Constitutional: Negative for fever, chills, appetite change and fatigue.  HENT: Negative for ear pain, sore throat, rhinorrhea, sneezing, neck pain, postnasal drip and sinus pressure.   Eyes: Negative for visual disturbance.  Respiratory: Negative for cough, shortness of breath and wheezing.   Cardiovascular: Negative for chest pain and palpitations.  Gastrointestinal: Negative for nausea and vomiting.  Neurological: Positive for light-headedness. Negative for speech difficulty, weakness, numbness and headaches.  Psychiatric/Behavioral: Negative for confusion.    Allergies  Review of patient's allergies indicates not on file.  Home Medications   Current Outpatient Rx  Name Route Sig Dispense Refill  . MECLIZINE HCL 25 MG PO TABS Oral Take 1 tablet (25 mg total)  by mouth every 8 (eight) hours as needed. 21 tablet 0    BP 105/73  Pulse 79  Temp(Src) 97.6 F (36.4 C) (Oral)  Resp 18  SpO2 100%  Physical Exam  Nursing note and vitals reviewed. Constitutional: She appears well-developed and well-nourished. No distress.  HENT:  Head: Normocephalic and atraumatic.  Right Ear: Tympanic membrane, external ear and ear canal normal.  Left Ear: Tympanic membrane, external ear and ear canal normal.  Nose: Nose normal.  Mouth/Throat: Uvula is midline, oropharynx is clear and moist and mucous membranes are normal. No oropharyngeal exudate, posterior oropharyngeal edema or posterior oropharyngeal erythema.  Eyes: Conjunctivae, EOM and lids are normal. Pupils are equal, round, and reactive to light.  Fundoscopic exam:      The right eye shows no AV nicking, no hemorrhage and no papilledema.       The left eye shows no AV nicking, no hemorrhage and no papilledema.  Neck: Trachea normal and full passive range of motion without pain. Neck supple. Carotid bruit is not present. No thyromegaly present.  Cardiovascular: Normal rate, regular rhythm and normal heart sounds.   Pulmonary/Chest: Effort normal and breath sounds normal. No respiratory distress.  Lymphadenopathy:    She has no cervical adenopathy.  Neurological: She is alert. She has normal strength. No cranial nerve deficit.  Reflex Scores:      Bicep reflexes are 2+ on the right side and 2+ on the left side. Skin: Skin is warm and dry.  Psychiatric: She has a normal mood and affect.    ED  Course  Procedures (including critical care time)  Labs Reviewed - No data to display No results found.   1. Vertigo, benign positional       MDM  Onset of lightheadedness with change of positions yesterday .PMH neg. No other symptoms , no orthostatic hypotension, and exam otherwise neg.         Melody Comas, [place] 01/06/11 1301

## 2011-02-24 ENCOUNTER — Emergency Department (HOSPITAL_COMMUNITY)
Admission: EM | Admit: 2011-02-24 | Discharge: 2011-02-25 | Disposition: A | Discharge status: Home / Self Care | Payer: Medicaid Other | Attending: Emergency Medicine | Admitting: Emergency Medicine

## 2011-02-24 DIAGNOSIS — J45909 Unspecified asthma, uncomplicated: Secondary | ICD-10-CM | POA: Insufficient documentation

## 2011-02-24 DIAGNOSIS — E119 Type 2 diabetes mellitus without complications: Secondary | ICD-10-CM | POA: Insufficient documentation

## 2011-02-24 DIAGNOSIS — R059 Cough, unspecified: Secondary | ICD-10-CM | POA: Insufficient documentation

## 2011-02-24 DIAGNOSIS — R05 Cough: Secondary | ICD-10-CM | POA: Insufficient documentation

## 2011-02-24 DIAGNOSIS — I10 Essential (primary) hypertension: Secondary | ICD-10-CM | POA: Insufficient documentation

## 2011-02-24 DIAGNOSIS — R51 Headache: Secondary | ICD-10-CM

## 2011-02-24 DIAGNOSIS — Z79899 Other long term (current) drug therapy: Secondary | ICD-10-CM | POA: Insufficient documentation

## 2011-02-24 DIAGNOSIS — J069 Acute upper respiratory infection, unspecified: Secondary | ICD-10-CM | POA: Insufficient documentation

## 2011-02-24 NOTE — ED Notes (Signed)
Patient complaining of a headache for two days; patient reports productive cough and a left ear ache that started three days ago.  Patient feels that the headache is caused by the cough.  Denies shortness of breath and chest pain.

## 2011-02-25 ENCOUNTER — Encounter (HOSPITAL_COMMUNITY): Payer: Self-pay | Admitting: Emergency Medicine

## 2011-02-25 ENCOUNTER — Emergency Department (HOSPITAL_COMMUNITY): Payer: Medicaid Other

## 2011-02-25 MED ORDER — ALBUTEROL SULFATE HFA 108 (90 BASE) MCG/ACT IN AERS: 2.0000 | INHALATION_SPRAY | RESPIRATORY_TRACT | Status: DC | PRN

## 2011-02-25 MED ORDER — ALBUTEROL SULFATE HFA 108 (90 BASE) MCG/ACT IN AERS
2.0000 | INHALATION_SPRAY | RESPIRATORY_TRACT | Status: DC | PRN
  Administered 2011-02-25: 2 via RESPIRATORY_TRACT
  Filled 2011-02-25: qty 6.7

## 2011-02-25 MED ORDER — BENZONATATE 100 MG PO CAPS: 100.0000 mg | ORAL_CAPSULE | Freq: Three times a day (TID) | ORAL | Status: AC

## 2011-02-25 MED ORDER — ACETAMINOPHEN 325 MG PO TABS
650.0000 mg | ORAL_TABLET | Freq: Once | ORAL | Status: AC
  Administered 2011-02-25: 650 mg via ORAL
  Filled 2011-02-25: qty 2

## 2011-02-25 NOTE — ED Provider Notes (Signed)
Medical screening examination/treatment/procedure(s) were performed by non-physician practitioner and as supervising physician I was immediately available for consultation/collaboration.    Celene Kras, MD 02/25/11 [phone]

## 2011-02-25 NOTE — ED Notes (Signed)
Pt extremely HOH, legally deaf; husband at bedside; able to communicate with pt without difficulty

## 2011-02-25 NOTE — ED Provider Notes (Signed)
History     CSN: [id number]  Arrival date & time 02/24/11  2306   First MD Initiated Contact with Patient 02/25/11 0134      Chief Complaint  Patient presents with  . Headache  . Cough    (Consider location/radiation/quality/duration/timing/severity/associated sxs/prior treatment) HPI  The patient presents to the emergency department with complaints of coughing for 3 days. When the patient coughs her head hurts. Therefore she has complained of headache for 3 days as well. This headache is worsened with coughing and straining. Patient denies having fevers, chills, as nausea, vomiting, diarrhea, weakness. Patient admits to having asthma but has run out of her albuterol inhaler and needs a refill. Patient has had a headache for describes his headache as being 5/10.  Pt has a history of headache and this is not unlike any headache she has had before. The patient speaks sign language as she is hard of hearing but declines the offer for an interpreter as she says she understands what im saying well enough. She also has her boyfriend in the room with her who has assisted in communication. Pt has not tried any medication for her headache  Past Medical History  Diagnosis Date  . Diabetes mellitus   . Hypertension   . Back pain   . Chronic PID   . Ventricular septal defect 2004 per echo    Past Surgical History  Procedure Date  . Cholecystectomy 11/04/1993  . Tubal ligation 10/05/1978  . Colposcopy     History reviewed. No pertinent family history.  History  Substance Use Topics  . Smoking status: Never Smoker   . Smokeless tobacco: Not on file  . Alcohol Use: No    OB History    Grav Para Term Preterm Abortions TAB SAB Ect Mult Living                  Review of Systems  All other systems reviewed and are negative.    Allergies  Codeine and Penicillins  Home Medications   Current Outpatient Rx  Name Route Sig Dispense Refill  . ALBUTEROL SULFATE HFA 108 (90 BASE)  MCG/ACT IN AERS Inhalation Inhale 1 puff into the lungs 2 (two) times daily as needed. 1 Inhaler 11  . GEMFIBROZIL 600 MG PO TABS Oral Take 1 tablet (600 mg total) by mouth 2 (two) times daily. 60 tablet 11  . METFORMIN HCL 1000 MG PO TABS Oral Take 1 tablet (1,000 mg total) by mouth 2 (two) times daily. 60 tablet 11  . OMEPRAZOLE 40 MG PO CPDR Oral Take 1 capsule (40 mg total) by mouth daily. 30 capsule 11  . ALBUTEROL SULFATE HFA 108 (90 BASE) MCG/ACT IN AERS Inhalation Inhale 2 puffs into the lungs every 4 (four) hours as needed for wheezing. 1 Inhaler 0  . BD ULTRA-FINE LANCETS MISC  To test blood glucose daily     . BENZONATATE 100 MG PO CAPS Oral Take 1 capsule (100 mg total) by mouth every 8 (eight) hours. 21 capsule 0  . FREESTYLE LITE DEVI  Check blood glucose once daily before breakfast or as directed     . GLUCOSE BLOOD VI STRP  To test blood sugar one time daily       BP 165/98  Pulse 91  Temp(Src) 98 F (36.7 C) (Oral)  Resp 16  SpO2 97%  Physical Exam  Nursing note and vitals reviewed. Constitutional: She appears well-developed and well-nourished.  HENT:  Head: Normocephalic and atraumatic.  Right Ear: External ear normal.  Left Ear: External ear normal.  Eyes: Conjunctivae are normal. Pupils are equal, round, and reactive to light.  Neck: Trachea normal, normal range of motion and full passive range of motion without pain. Neck supple.  Cardiovascular: Normal rate, regular rhythm and normal pulses.   Pulmonary/Chest: Effort normal and breath sounds normal. She has no wheezes. She has no rales. Chest wall is not dull to percussion. She exhibits no tenderness, no crepitus, no edema, no deformity and no retraction.       Pt is coughing during exam  Abdominal: Soft. Normal appearance and bowel sounds are normal.  Musculoskeletal: Normal range of motion.  Neurological: She is alert. She has normal strength. No cranial nerve deficit or sensory deficit. GCS eye subscore is 4.  GCS verbal subscore is 5. GCS motor subscore is 6.  Skin: Skin is warm, dry and intact.  Psychiatric: She has a normal mood and affect. Her speech is normal and behavior is normal. Judgment and thought content normal. Cognition and memory are normal.    ED Course  Procedures (including critical care time)  Labs Reviewed - No data to display Dg Chest 2 View  02/25/2011  *RADIOLOGY REPORT*  Clinical Data: Cough, shortness of breath, fever and vomiting. Headache.  History of diabetes and hypertension.  CHEST - 2 VIEW  Comparison: Chest radiograph performed 01/01/2010  Findings: The lungs are well-aerated and clear.  There is no evidence of focal opacification, pleural effusion or pneumothorax.  The heart is normal in size; the mediastinal contour is within normal limits.  No acute osseous abnormalities are seen.  Mild degenerative change is noted along the thoracic spine.  IMPRESSION: No acute cardiopulmonary process seen.  Original Report Authenticated By: Tonia Ghent, M.D.     1. Headache   2. URI (upper respiratory infection)       MDM  Pt given Tylenol in ED for headache and an RX for tessalon perls and albuterol. Pt instructed to follow-up with PCP in the next week for reassessment. Return to ED precautions given.        Dorthula Matas, PA 02/25/11 [phone]

## 2011-03-26 ENCOUNTER — Other Ambulatory Visit: Payer: Self-pay | Admitting: Internal Medicine

## 2011-03-26 DIAGNOSIS — Z1231 Encounter for screening mammogram for malignant neoplasm of breast: Secondary | ICD-10-CM

## 2011-04-07 ENCOUNTER — Ambulatory Visit (INDEPENDENT_AMBULATORY_CARE_PROVIDER_SITE_OTHER): Payer: Medicaid Other | Admitting: Internal Medicine

## 2011-04-07 ENCOUNTER — Encounter: Payer: Self-pay | Admitting: Internal Medicine

## 2011-04-07 VITALS — BP 148/88 | HR 88 | Temp 97.9°F | Ht 68.0 in | Wt 200.4 lb

## 2011-04-07 DIAGNOSIS — M25569 Pain in unspecified knee: Secondary | ICD-10-CM

## 2011-04-07 DIAGNOSIS — Z79899 Other long term (current) drug therapy: Secondary | ICD-10-CM

## 2011-04-07 DIAGNOSIS — M25561 Pain in right knee: Secondary | ICD-10-CM

## 2011-04-07 DIAGNOSIS — K219 Gastro-esophageal reflux disease without esophagitis: Secondary | ICD-10-CM

## 2011-04-07 DIAGNOSIS — E119 Type 2 diabetes mellitus without complications: Secondary | ICD-10-CM

## 2011-04-07 DIAGNOSIS — R197 Diarrhea, unspecified: Secondary | ICD-10-CM

## 2011-04-07 MED ORDER — ACETAMINOPHEN 325 MG PO TABS: 650.0000 mg | ORAL_TABLET | ORAL | Status: AC | PRN

## 2011-04-07 MED ORDER — FAMOTIDINE 20 MG PO TABS: 20.0000 mg | ORAL_TABLET | Freq: Two times a day (BID) | ORAL | Status: DC

## 2011-04-07 NOTE — Progress Notes (Signed)
Patient ID: Janice Massey, female   DOB: 1955-01-11, 56 y.o.   MRN: [id number] HPI:    1. Right knee pain x 2 weeks. Hx of right knee trauma few years ago with ?knee dislocation. Pain is sharp, worse with climbing and descending the stairs, gives way and locks "sometimes." She denies any radiculopathy, fever, chills, ulcers. Patient is not sexually active. Sx are getting progressively worse with an intermittent shooting pain 5/10 in intensity. 2. Diarrhea x 6 hours. Reports eating "funny tasting food" at the restaurant on 04/07/11. Denies any fever, chills, rash; watery diarrhea without any blood, mucus or malodor. 3. Reports Heartburn and bitter taste in her mouth, "especially at night. Denies odynophagia, dysphagia, weight loss, cough, wheezing or any other Sx. Review of Systems: Negative except per history of present illness  Physical Exam:  Nursing notes and vitals reviewed General:  alert, well-developed, and cooperative to examination.   Lungs:  normal respiratory effort, no accessory muscle use, normal breath sounds, no crackles, and no wheezes. Heart:  normal rate, regular rhythm, no murmurs, no gallop, and no rub.   Abdomen:  soft, non-tender, normal bowel sounds, no distention, no guarding, no rebound tenderness, no hepatomegaly, and no splenomegaly.   Extremities:  No cyanosis, clubbing, edema. Right knee with acute TTP over patellar and medial infrapatellar region; crepitus with flexion and extension; locking with extension; Drawer's test neg; Lachman's test positive; decreased ROM on extension to 90 degrees. Neurologic:  alert & oriented X3, nonfocal exam  Meds: Medications Prior to Admission  Medication Sig Dispense Refill  . albuterol (PROAIR HFA) 108 (90 BASE) MCG/ACT inhaler Inhale 1 puff into the lungs 2 (two) times daily as needed.  1 Inhaler  11  . albuterol (PROVENTIL HFA;VENTOLIN HFA) 108 (90 BASE) MCG/ACT inhaler Inhale 2 puffs into the lungs every 4 (four) hours as needed for  wheezing.  1 Inhaler  0  . BD ULTRA-FINE LANCETS lancets To test blood glucose daily       . Blood Glucose Monitoring Suppl (FREESTYLE LITE) DEVI Check blood glucose once daily before breakfast or as directed       . famotidine (PEPCID) 20 MG tablet Take 1 tablet (20 mg total) by mouth 2 (two) times daily.  60 tablet  1  . gemfibrozil (LOPID) 600 MG tablet Take 1 tablet (600 mg total) by mouth 2 (two) times daily.  60 tablet  11  . glucose blood (FREESTYLE LITE) test strip To test blood sugar one time daily       . metFORMIN (GLUCOPHAGE) 1000 MG tablet Take 1 tablet (1,000 mg total) by mouth 2 (two) times daily.  60 tablet  11  . omeprazole (PRILOSEC) 40 MG capsule Take 1 capsule (40 mg total) by mouth daily.  30 capsule  11   No current facility-administered medications on file as of 04/07/2011.    Allergies: Codeine and Penicillins Past Medical History  Diagnosis Date  . Diabetes mellitus   . Hypertension   . Back pain   . Chronic PID   . Ventricular septal defect 2004 per echo   Past Surgical History  Procedure Date  . Cholecystectomy 11/04/1993  . Tubal ligation 10/05/1978  . Colposcopy    No family history on file. History   Social History  . Marital Status: Single    Spouse Name: N/A    Number of Children: 1  . Years of Education: N/A   Occupational History  .     Social History Main  Topics  . Smoking status: Never Smoker   . Smokeless tobacco: Not on file  . Alcohol Use: No  . Drug Use: No  . Sexually Active: Not on file   Other Topics Concern  . Not on file   Social History Narrative  . No narrative on file   A/P: 1. Right knee acute pain -chondromalacia vs torn meniscus -MRI of right knee -tylenol PRN -apply ice PRN -walking cane to prevent additional faults and improve mobility  2. Diarrhea, likely viral -hydration -bland diet (hand out given) -if no improvement, fever, chills, sweats, abdominal  Pain --instructed to go to ED  3. GERD -no red  flags -Pepcid -weight management -no late night meals - No tight clothing  4. Dm, type 2 -controlled -diet, exercise, foot care reviewed -no change in therapy

## 2011-04-07 NOTE — Patient Instructions (Signed)
Please, follow up with MRI of right knee as instructed. Please, follow diet (handout printed) Please, follow up after MRI test.

## 2011-04-23 ENCOUNTER — Ambulatory Visit
Admission: RE | Admit: 2011-04-23 | Discharge: 2011-04-23 | Disposition: A | Discharge status: Home / Self Care | Payer: Self-pay | Source: Ambulatory Visit | Attending: Internal Medicine | Admitting: Internal Medicine

## 2011-04-23 ENCOUNTER — Other Ambulatory Visit: Payer: Self-pay | Admitting: Internal Medicine

## 2011-04-23 ENCOUNTER — Ambulatory Visit: Payer: Medicaid Other

## 2011-04-23 DIAGNOSIS — Z1231 Encounter for screening mammogram for malignant neoplasm of breast: Secondary | ICD-10-CM

## 2011-04-24 ENCOUNTER — Encounter (HOSPITAL_COMMUNITY): Payer: Self-pay | Admitting: Emergency Medicine

## 2011-04-24 ENCOUNTER — Emergency Department (HOSPITAL_COMMUNITY): Payer: Self-pay

## 2011-04-24 ENCOUNTER — Inpatient Hospital Stay (HOSPITAL_COMMUNITY)
Admission: EM | Admit: 2011-04-24 | Discharge: 2011-04-27 | DRG: 390 | Disposition: A | Discharge status: Home / Self Care | Payer: Self-pay | Attending: Family Medicine | Admitting: Family Medicine

## 2011-04-24 DIAGNOSIS — K56600 Partial intestinal obstruction, unspecified as to cause: Secondary | ICD-10-CM

## 2011-04-24 DIAGNOSIS — K56609 Unspecified intestinal obstruction, unspecified as to partial versus complete obstruction: Principal | ICD-10-CM | POA: Diagnosis present

## 2011-04-24 DIAGNOSIS — N390 Urinary tract infection, site not specified: Secondary | ICD-10-CM | POA: Insufficient documentation

## 2011-04-24 DIAGNOSIS — E876 Hypokalemia: Secondary | ICD-10-CM | POA: Diagnosis present

## 2011-04-24 DIAGNOSIS — R82998 Other abnormal findings in urine: Secondary | ICD-10-CM | POA: Diagnosis present

## 2011-04-24 LAB — CBC
HCT: 41 % (ref 36.0–46.0)
RBC: 4.47 MIL/uL (ref 3.87–5.11)
RDW: 12.7 % (ref 11.5–15.5)
WBC: 8.4 10*3/uL (ref 4.0–10.5)

## 2011-04-24 LAB — COMPREHENSIVE METABOLIC PANEL
ALT: 16 U/L (ref 0–35)
AST: 16 U/L (ref 0–37)
CO2: 26 mEq/L (ref 19–32)
Chloride: 101 mEq/L (ref 96–112)
Creatinine, Ser: 0.73 mg/dL (ref 0.50–1.10)
GFR calc non Af Amer: >90 mL/min (ref >90)
Sodium: 139 mEq/L (ref 135–145)
Total Bilirubin: 1 mg/dL (ref 0.3–1.2)

## 2011-04-24 LAB — URINALYSIS, ROUTINE W REFLEX MICROSCOPIC
Glucose, UA: NEGATIVE mg/dL
Ketones, ur: NEGATIVE mg/dL
Protein, ur: 30 mg/dL — AB

## 2011-04-24 LAB — URINE MICROSCOPIC-ADD ON

## 2011-04-24 LAB — DIFFERENTIAL
Basophils Absolute: 0 10*3/uL (ref 0.0–0.1)
Lymphocytes Relative: 12 % (ref 12–46)
Lymphs Abs: 1 10*3/uL (ref 0.7–4.0)
Monocytes Absolute: 0.6 10*3/uL (ref 0.1–1.0)
Neutro Abs: 6.7 10*3/uL (ref 1.7–7.7)

## 2011-04-24 MED ORDER — PANTOPRAZOLE SODIUM 40 MG IV SOLR
40.0000 mg | INTRAVENOUS | Status: DC
  Administered 2011-04-24 – 2011-04-26 (×3): 40 mg via INTRAVENOUS
  Filled 2011-04-24 (×4): qty 40

## 2011-04-24 MED ORDER — ONDANSETRON HCL 4 MG/2ML IJ SOLN
4.0000 mg | Freq: Once | INTRAMUSCULAR | Status: AC
  Administered 2011-04-24: 4 mg via INTRAVENOUS
  Filled 2011-04-24: qty 2

## 2011-04-24 MED ORDER — HEPARIN SODIUM (PORCINE) 5000 UNIT/ML IJ SOLN
5000.0000 [IU] | Freq: Three times a day (TID) | INTRAMUSCULAR | Status: DC
  Administered 2011-04-24 – 2011-04-27 (×9): 5000 [IU] via SUBCUTANEOUS
  Filled 2011-04-24 (×11): qty 1

## 2011-04-24 MED ORDER — SODIUM CHLORIDE 0.9 % IV BOLUS (SEPSIS)
500.0000 mL | Freq: Once | INTRAVENOUS | Status: AC
  Administered 2011-04-24: 500 mL via INTRAVENOUS

## 2011-04-24 MED ORDER — MORPHINE SULFATE 4 MG/ML IJ SOLN
4.0000 mg | Freq: Once | INTRAMUSCULAR | Status: AC
  Administered 2011-04-24: 4 mg via INTRAVENOUS
  Filled 2011-04-24: qty 1

## 2011-04-24 MED ORDER — FENTANYL CITRATE 0.05 MG/ML IJ SOLN
50.0000 ug | Freq: Once | INTRAMUSCULAR | Status: AC
  Administered 2011-04-24: 50 ug via INTRAVENOUS
  Filled 2011-04-24: qty 2

## 2011-04-24 MED ORDER — LIDOCAINE HCL 2 % EX GEL
CUTANEOUS | Status: AC
  Administered 2011-04-24: 20
  Filled 2011-04-24: qty 20

## 2011-04-24 MED ORDER — MORPHINE SULFATE 2 MG/ML IJ SOLN
2.0000 mg | INTRAMUSCULAR | Status: DC | PRN
  Administered 2011-04-24 – 2011-04-26 (×4): 2 mg via INTRAVENOUS
  Filled 2011-04-24 (×4): qty 1

## 2011-04-24 MED ORDER — ONDANSETRON HCL 4 MG/2ML IJ SOLN: 4.0000 mg | Freq: Four times a day (QID) | INTRAMUSCULAR | Status: DC | PRN

## 2011-04-24 MED ORDER — KCL IN DEXTROSE-NACL 20-5-0.9 MEQ/L-%-% IV SOLN
INTRAVENOUS | Status: DC
  Administered 2011-04-24 – 2011-04-25 (×2): via INTRAVENOUS
  Administered 2011-04-26: 125 mL/h via INTRAVENOUS
  Administered 2011-04-26 – 2011-04-27 (×3): via INTRAVENOUS
  Filled 2011-04-24 (×12): qty 1000

## 2011-04-24 MED ORDER — OXYMETAZOLINE HCL 0.05 % NA SOLN
NASAL | Status: AC
  Administered 2011-04-24: 18:00:00
  Filled 2011-04-24: qty 15

## 2011-04-24 MED ORDER — ACETAMINOPHEN 325 MG PO TABS
ORAL_TABLET | ORAL | Status: AC
  Filled 2011-04-24: qty 2

## 2011-04-24 MED ORDER — IOHEXOL 300 MG/ML  SOLN
20.0000 mL | INTRAMUSCULAR | Status: AC
  Administered 2011-04-24 (×2): 20 mL via ORAL

## 2011-04-24 MED ORDER — METOCLOPRAMIDE HCL 5 MG/ML IJ SOLN
10.0000 mg | Freq: Once | INTRAMUSCULAR | Status: AC
  Administered 2011-04-24: 10 mg via INTRAVENOUS
  Filled 2011-04-24: qty 2

## 2011-04-24 MED ORDER — SODIUM CHLORIDE 0.9 % IV SOLN
INTRAVENOUS | Status: DC
  Administered 2011-04-24 (×2): 125 mL/h via INTRAVENOUS

## 2011-04-24 MED ORDER — CEPHALEXIN 500 MG PO CAPS
500.0000 mg | ORAL_CAPSULE | Freq: Two times a day (BID) | ORAL | Status: DC
  Administered 2011-04-25: 500 mg via ORAL
  Filled 2011-04-24 (×3): qty 1

## 2011-04-24 MED ORDER — DEXTROSE 5 % IV SOLN
1.0000 g | Freq: Once | INTRAVENOUS | Status: AC
  Administered 2011-04-24: 1 g via INTRAVENOUS
  Filled 2011-04-24: qty 10

## 2011-04-24 MED ORDER — IOHEXOL 300 MG/ML  SOLN
100.0000 mL | Freq: Once | INTRAMUSCULAR | Status: AC | PRN
  Administered 2011-04-24: 100 mL via INTRAVENOUS

## 2011-04-24 MED ORDER — ONDANSETRON HCL 4 MG PO TABS: 4.0000 mg | ORAL_TABLET | Freq: Four times a day (QID) | ORAL | Status: DC | PRN

## 2011-04-24 NOTE — ED Provider Notes (Signed)
Transferred care from [facility] .  Plan for CT a/p.  Given morphine and zofran for further sx relief.  Continue on obs.  Lindley Magnus [place], [place] 04/24/11 [phone]

## 2011-04-24 NOTE — ED Provider Notes (Signed)
History     CSN: [id number]  Arrival date & time 04/24/11  1018   First MD Initiated Contact with Patient 04/24/11 1109     11:27 AM HPI Patient reports diffuse abdominal pain, diarrhea, nausea since last night. Does not recall using anything different her traveling out of the country. Patient describes abdominal pain as crampy. Associated with urinary frequency. Denies history of prior surgeries. Denies eating abnormal food. Denies fever, vaginal symptoms, hematochezia, hematemesis.  Patient is a 56 y.o. female presenting with abdominal pain. The history is provided by the patient.  Abdominal Pain The primary symptoms of the illness include abdominal pain, nausea and diarrhea. The primary symptoms of the illness do not include fever, shortness of breath, vomiting, dysuria or vaginal discharge. The current episode started yesterday. The onset of the illness was gradual. The problem has been gradually worsening.  The abdominal pain is generalized. The abdominal pain does not radiate. The abdominal pain is relieved by bowel movement.  Additional symptoms associated with the illness include frequency. Symptoms associated with the illness do not include chills, constipation, urgency, hematuria or back pain.    History reviewed. No pertinent past medical history.  Past Surgical History  Procedure Date  . Abdominal hysterectomy     History reviewed. No pertinent family history.  History  Substance Use Topics  . Smoking status: Never Smoker   . Smokeless tobacco: Not on file  . Alcohol Use: Yes     sometimes    OB History    Grav Para Term Preterm Abortions TAB SAB Ect Mult Living                  Review of Systems  Constitutional: Negative for fever and chills.  Respiratory: Negative for shortness of breath.   Cardiovascular: Negative for chest pain.  Gastrointestinal: Positive for nausea, abdominal pain and diarrhea. Negative for vomiting and constipation.  Genitourinary:  Positive for frequency. Negative for dysuria, urgency, hematuria, flank pain, vaginal discharge and vaginal pain.  Musculoskeletal: Negative for back pain.  Neurological: Negative for headaches.  All other systems reviewed and are negative.    Allergies  Review of patient's allergies indicates no known allergies.  Home Medications   Current Outpatient Rx  Name Route Sig Dispense Refill  . ACETAMINOPHEN 500 MG PO TABS Oral Take 500 mg by mouth every 6 (six) hours as needed. For pain.      BP 112/75  Pulse 88  Temp 97.7 F (36.5 C)  Resp 20  SpO2 98%  Physical Exam  Vitals reviewed. Constitutional: She is oriented to person, place, and time. Vital signs are normal. She appears well-developed and well-nourished.  HENT:  Head: Normocephalic and atraumatic.  Eyes: Conjunctivae are normal. Pupils are equal, round, and reactive to light.  Neck: Normal range of motion. Neck supple.  Cardiovascular: Normal rate, regular rhythm and normal heart sounds.  Exam reveals no friction rub.   No murmur heard. Pulmonary/Chest: Effort normal and breath sounds normal. She has no wheezes. She has no rhonchi. She has no rales. She exhibits no tenderness.  Abdominal: Soft. Bowel sounds are normal. She exhibits no distension and no mass. There is no hepatosplenomegaly. There is tenderness (diffuse but mosignificant in the LLQ). There is no rigidity, no rebound, no guarding, no tenderness at McBurney's point and negative Murphy's sign.  Musculoskeletal: Normal range of motion.  Neurological: She is alert and oriented to person, place, and time. Coordination normal.  Skin: Skin is warm and dry.  No rash noted. No erythema. No pallor.    ED Course  Procedures  Results for orders placed during the hospital encounter of 04/24/11  CBC      Component Value Range   WBC 8.4  4.0 - 10.5 (K/uL)   RBC 4.47  3.87 - 5.11 (MIL/uL)   Hemoglobin 14.4  12.0 - 15.0 (g/dL)   HCT 19.1  47.8 - 29.5 (%)   MCV 91.7   78.0 - 100.0 (fL)   MCH 32.2  26.0 - 34.0 (pg)   MCHC 35.1  30.0 - 36.0 (g/dL)   RDW 62.1  30.8 - 65.7 (%)   Platelets 243  150 - 400 (K/uL)  DIFFERENTIAL      Component Value Range   Neutrophils Relative 81 (*) 43 - 77 (%)   Neutro Abs 6.7  1.7 - 7.7 (K/uL)   Lymphocytes Relative 12  12 - 46 (%)   Lymphs Abs 1.0  0.7 - 4.0 (K/uL)   Monocytes Relative 7  3 - 12 (%)   Monocytes Absolute 0.6  0.1 - 1.0 (K/uL)   Eosinophils Relative 1  0 - 5 (%)   Eosinophils Absolute 0.1  0.0 - 0.7 (K/uL)   Basophils Relative 0  0 - 1 (%)   Basophils Absolute 0.0  0.0 - 0.1 (K/uL)  COMPREHENSIVE METABOLIC PANEL      Component Value Range   Sodium 139  135 - 145 (mEq/L)   Potassium 3.3 (*) 3.5 - 5.1 (mEq/L)   Chloride 101  96 - 112 (mEq/L)   CO2 26  19 - 32 (mEq/L)   Glucose, Bld 116 (*) 70 - 99 (mg/dL)   BUN 16  6 - 23 (mg/dL)   Creatinine, Ser 8.46  0.50 - 1.10 (mg/dL)   Calcium 9.7  8.4 - 96.2 (mg/dL)   Total Protein 7.9  6.0 - 8.3 (g/dL)   Albumin 4.4  3.5 - 5.2 (g/dL)   AST 16  0 - 37 (U/L)   ALT 16  0 - 35 (U/L)   Alkaline Phosphatase 79  39 - 117 (U/L)   Total Bilirubin 1.0  0.3 - 1.2 (mg/dL)   GFR calc non Af Amer >90  >90 (mL/min)   GFR calc Af Amer >90  >90 (mL/min)  LIPASE, BLOOD      Component Value Range   Lipase 10 (*) 11 - 59 (U/L)   Mm Digital Screening  04/23/2011  DG SCREEN MAMMOGRAM BILATERAL Bilateral CC and MLO view(s) were taken.  DIGITAL SCREENING MAMMOGRAM WITH CAD: The breast tissue is extremely dense.  No masses or malignant type calcifications are identified.   Compared with prior studies.  Images were processed with CAD.  IMPRESSION: No specific mammographic evidence of malignancy.  Next screening mammogram is recommended in one  year.  A result letter of this screening mammogram will be mailed directly to the patient.  ASSESSMENT: Negative - BI-RADS 1  Screening mammogram in 1 year. ,    MDM   12:52 PM Patient reexamined. Reports symptoms. Still has abdominal  pain. Patient does not abdominal pain with palpation in the lower quadrant and epigastrium especially. Will order CT of the abdomen and pelvis to rule out diverticulitis. Discussed this with patient. Discussed this with Betsey Holiday PA-C. in the CDU       Thomasene Lot, PA-C 04/24/11 1253

## 2011-04-24 NOTE — ED Provider Notes (Signed)
Medical screening examination/treatment/procedure(s) were conducted as a shared visit with non-physician practitioner(s) and myself.  I personally evaluated the patient during the encounter  See my previous note. PA f/u CT A/P. Patient admitted after I signed out.   Forbes Cellar, MD 04/24/11 2106

## 2011-04-24 NOTE — ED Notes (Signed)
Pt finished drinking her oral contrast and radiology was notified.

## 2011-04-24 NOTE — ED Notes (Signed)
Pt c/o abdominal pain with diarrhea last night.

## 2011-04-24 NOTE — ED Provider Notes (Signed)
Medical screening examination/treatment/procedure(s) were conducted as a shared visit with non-physician practitioner(s) and myself.  I personally evaluated the patient during the encounter   Forbes Cellar, MD 04/24/11 1627

## 2011-04-24 NOTE — H&P (Signed)
Brandi Mendez is an 56 y.o. female.   History and Physical Note Family Medicine Teaching Service Service Pager: [phone]  Chief Complaint: Abdominal pain, N/V HPI: Patient is a 56 yo F with PMH of abdominal surgeries (abd hysterectomy in 2003, abd hernia repair in 2007 and cholecystectomy) as well as recurrent small bowel obstruction who is presenting with diffuse abdominal pain x1 day. Patient states she began having nausea/vomiting that started today. Prior to yesterday, patient was overall well. Onset of pain was acute, and progressively worsening. Describes pain as constant. She has not had any sick contacts, eaten any food out of her usual diet. She has not travelled recently. She states she was able to eat yesterday but has not had anything to eat or drink today. Last bowel movement yesterday- No blood, loose consistency. Patient had large volume emesis when she arrived to ED which looked like what she ate last. No blood noted. She denies fevers/chills. Some headache yesterday but otherwise, ROS negative.  On further review of history in the chart, it is noted that patient has had multiple abdominal surgeries. She had a SBO in 2010 and 2011, most likely secondary to adhesions. She was hospitalized both times and surgery was consulted.  ED Course: Patient had large emesis. CT scan shows partial SBO and adhesions. NGT was placed and has had large output. Also, patient was noted to have questionable UTI on UA and given one dose of Rocephin. Family Medicine called for admission.   History reviewed. No pertinent past medical history.  Past Surgical History  Procedure Date  . Abdominal hysterectomy     History reviewed. No pertinent family history. Social History:  reports that she has never smoked. She does not have any smokeless tobacco history on file. She reports that she drinks alcohol. She reports that she does not use illicit drugs. Lives with her daughter. Drinks a few times per month.  Last drink was 2 weeks ago.  Allergies: No Known Allergies  Prior to Admission medications   Medication Sig Start Date End Date Taking? Authorizing Provider  acetaminophen (TYLENOL) 500 MG tablet Take 500 mg by mouth every 6 (six) hours as needed. For pain.   Yes Historical Provider, MD    Results for orders placed during the hospital encounter of 04/24/11 (from the past 48 hour(s))  CBC     Status: Normal   Collection Time   04/24/11 11:40 AM      Component Value Range Comment   WBC 8.4  4.0 - 10.5 (K/uL)    RBC 4.47  3.87 - 5.11 (MIL/uL)    Hemoglobin 14.4  12.0 - 15.0 (g/dL)    HCT 29.5  28.4 - 13.2 (%)    MCV 91.7  78.0 - 100.0 (fL)    MCH 32.2  26.0 - 34.0 (pg)    MCHC 35.1  30.0 - 36.0 (g/dL)    RDW 44.0  10.2 - 72.5 (%)    Platelets 243  150 - 400 (K/uL)   DIFFERENTIAL     Status: Abnormal   Collection Time   04/24/11 11:40 AM      Component Value Range Comment   Neutrophils Relative 81 (*) 43 - 77 (%)    Neutro Abs 6.7  1.7 - 7.7 (K/uL)    Lymphocytes Relative 12  12 - 46 (%)    Lymphs Abs 1.0  0.7 - 4.0 (K/uL)    Monocytes Relative 7  3 - 12 (%)    Monocytes Absolute  0.6  0.1 - 1.0 (K/uL)    Eosinophils Relative 1  0 - 5 (%)    Eosinophils Absolute 0.1  0.0 - 0.7 (K/uL)    Basophils Relative 0  0 - 1 (%)    Basophils Absolute 0.0  0.0 - 0.1 (K/uL)   COMPREHENSIVE METABOLIC PANEL     Status: Abnormal   Collection Time   04/24/11 11:40 AM      Component Value Range Comment   Sodium 139  135 - 145 (mEq/L)    Potassium 3.3 (*) 3.5 - 5.1 (mEq/L)    Chloride 101  96 - 112 (mEq/L)    CO2 26  19 - 32 (mEq/L)    Glucose, Bld 116 (*) 70 - 99 (mg/dL)    BUN 16  6 - 23 (mg/dL)    Creatinine, Ser 1.47  0.50 - 1.10 (mg/dL)    Calcium 9.7  8.4 - 10.5 (mg/dL)    Total Protein 7.9  6.0 - 8.3 (g/dL)    Albumin 4.4  3.5 - 5.2 (g/dL)    AST 16  0 - 37 (U/L)    ALT 16  0 - 35 (U/L)    Alkaline Phosphatase 79  39 - 117 (U/L)    Total Bilirubin 1.0  0.3 - 1.2 (mg/dL)    GFR calc  non Af Amer >90  >90 (mL/min)    GFR calc Af Amer >90  >90 (mL/min)   LIPASE, BLOOD     Status: Abnormal   Collection Time   04/24/11 11:40 AM      Component Value Range Comment   Lipase 10 (*) 11 - 59 (U/L)   URINALYSIS, ROUTINE W REFLEX MICROSCOPIC     Status: Abnormal   Collection Time   04/24/11  3:57 PM      Component Value Range Comment   Color, Urine AMBER (*) YELLOW  BIOCHEMICALS MAY BE AFFECTED BY COLOR   APPearance TURBID (*) CLEAR     Specific Gravity, Urine 1.029  1.005 - 1.030     pH 5.5  5.0 - 8.0     Glucose, UA NEGATIVE  NEGATIVE (mg/dL)    Hgb urine dipstick MODERATE (*) NEGATIVE     Bilirubin Urine SMALL (*) NEGATIVE     Ketones, ur NEGATIVE  NEGATIVE (mg/dL)    Protein, ur 30 (*) NEGATIVE (mg/dL)    Urobilinogen, UA 1.0  0.0 - 1.0 (mg/dL)    Nitrite NEGATIVE  NEGATIVE     Leukocytes, UA MODERATE (*) NEGATIVE    URINE MICROSCOPIC-ADD ON     Status: Abnormal   Collection Time   04/24/11  3:57 PM      Component Value Range Comment   Squamous Epithelial / LPF MANY (*) RARE     WBC, UA TOO NUMEROUS TO COUNT  <3 (WBC/hpf)    RBC / HPF 7-10  <3 (RBC/hpf)    Bacteria, UA MANY (*) RARE     Urine-Other MUCOUS PRESENT      Ct Abdomen Pelvis W Contrast  04/24/2011  *RADIOLOGY REPORT*  Clinical Data: Diffuse abdominal pain since last night.  Nausea and diarrhea.  Vomiting.  Rule out diverticulitis.  CT ABDOMEN AND PELVIS WITH CONTRAST  Technique:  Multidetector CT imaging of the abdomen and pelvis was performed following the standard protocol during bolus administration of intravenous contrast.  Contrast: OMNIPAQUE IOHEXOL 300 MG/ML  SOLN  Comparison: 02/25/2010  Findings: Calcified granuloma at the right lung base. Normal heart size.  Mild  hepatic steatosis.  No focal liver lesions.  Normal spleen.  The stomach is mildly dilated.  The proximal duodenum is also mildly dilated and contrast filled.  Mild pancreatic atrophy. Cholecystectomy without biliary ductal dilatation.   Normal adrenal glands.  Interpolar left renal cyst. Normal right kidney. No retroperitoneal or retrocrural adenopathy.  The cecum is positioned within the pelvis.  The appendix is likely identified on image 62.  Proximal and mid small bowel loops are moderately dilated.  Example loop 3.8 cm on image 42. This dilatation continues to the level of a transition in the region of surgical clips in the periumbilical mid abdomen.  Example images 46 - 61 of series 2.  No obstructing mass.  No pneumatosis or wall thickening.  There may be minimal mesenteric interloop edema on image 56 of series 2.  No pelvic adenopathy.    Normal urinary bladder.  Hysterectomy.  No adnexal mass.  No significant free fluid.  No acute osseous abnormality.  Minimal periumbilical abdominal wall laxity versus fat containing hernia.  Just cephalad the presumed small bowel obstruction site.  IMPRESSION:  1.  Findings of partial small bowel obstruction at the mid small bowel.  Likely secondary to adhesions.  Distention of the stomach and proximal duodenum for which  nasogastric tube placement should be considered. 2.  No evidence of diverticulitis. 3.  Mild hepatic steatosis.  Original Report Authenticated By: Consuello Bossier, M.D.   Mm Digital Screening  04/23/2011  DG SCREEN MAMMOGRAM BILATERAL Bilateral CC and MLO view(s) were taken.  DIGITAL SCREENING MAMMOGRAM WITH CAD: The breast tissue is extremely dense.  No masses or malignant type calcifications are identified.   Compared with prior studies.  Images were processed with CAD.  IMPRESSION: No specific mammographic evidence of malignancy.  Next screening mammogram is recommended in one  year.  A result letter of this screening mammogram will be mailed directly to the patient.  ASSESSMENT: Negative - BI-RADS 1  Screening mammogram in 1 year. ,    Review of Systems  Constitutional: Negative for fever and chills.  Respiratory: Negative for shortness of breath.   Cardiovascular: Negative  for chest pain.  Gastrointestinal: Positive for nausea, vomiting and abdominal pain.  Genitourinary: Negative for dysuria.  Musculoskeletal: Negative for back pain.  Skin: Negative for rash.  Neurological: Negative for dizziness and headaches.    Blood pressure 106/63, pulse 70, temperature 97.7 F (36.5 C), resp. rate 16, SpO2 100.00%. Physical Exam  Constitutional: She is oriented to person, place, and time. She appears well-developed and well-nourished. She appears distressed (Appears uncomfortable).  HENT:  Head: Normocephalic and atraumatic.  Mouth/Throat: Oropharynx is clear and moist.       NGT in place right nostril. 1/2 canister full of dark green fluid (<30 mins after placement)  Eyes: Pupils are equal, round, and reactive to light.  Neck: Normal range of motion.  Cardiovascular: Normal rate and regular rhythm.   No murmur heard. Respiratory: Effort normal and breath sounds normal.  GI: Soft. Bowel sounds are decreased. There is generalized tenderness (Most tender in upper quadrants). There is no CVA tenderness.       Patient has scars.  1. Midline inferior to umbilicus 2. Transverse to left of umbilicus 3. Two small ?laparoscopic scars in left upper quadrant  Musculoskeletal: Normal range of motion. She exhibits no edema.  Lymphadenopathy:    She has no cervical adenopathy.  Neurological: She is alert and oriented to person, place, and time. No cranial nerve  deficit.  Skin: Skin is warm and dry. She is not diaphoretic.     Assessment/Plan 56 yo F with history of abdominal surgeries presenting with recurrent small bowel obstruction  1. Abdominal pain- Likely partial SBO seen on CT scan. Patient with history of SBO in 2010 and 2011. She states current pain is similar to then. Other differential diagnosis include gastroenteritis, although patient does not have a white count or fevers/chills. Most likely obstruction is secondary to adhesions from previous surgeries. -  Admit to Med-Surg. Attending Dr. Deirdre Priest - Keep NPO - Continue NG tube. Patient with large volume output. When output decreases, will clamp tube. - Zofran prn for nausea - Morphine 2mg  q2 hours for pain control - Protonix IV - Continue IVF D5NS +20 mEq KCl @125cc /hr - Will consider calling surgery given this is patients 3rd admission for small bowel obstruction  2. Urinary tract infection- UA shows urine with leukocytes and many bacteria, nitrite negative. Treated with Rocephin the ED.  The patient is asymptomatic.  - Urine culture sent - Will treat with Keflex until urine results return  3. FEN/GI- NPO. IVF D5NS+ KCL @ 125cc/hr. Protonix and Zofran  4. PPx- Heparin sq. Protonix IV while NPO  5. Dispo- Pending clinical improvement  Code: Limited (DNI)  HAIR[place]ORD, AMBER 04/24/2011, 6:24 PM  PGY-2 addendum  I have seen and evaluated this patient along with Dr. Mikel Cella.  I agree with history, exam findings and plan as described above.  In addition this is patients third admission for partial SBO.  Surgery consulted during last admission and she was supposed to f/u with their office, unclear if this occurred.  Will plan to re-consult surgery during this admission given recurrence of partial SBO.  Plan to hydrate and keep NG tube in place for gastric decompression.  UA with ?UTI although not a clean catch sample.  Will send for culture, and change rocephin to cephalexin.  Could probably consider discontinuation of abx as patient has urinary symptoms, but will leave for now until cx returns.  Everrett Coombe, DO

## 2011-04-24 NOTE — ED Provider Notes (Signed)
Pt presented to ED w/ central abd pain, N/V/D since yesterday.  She is in CDU awaiting CT abd/pelvis.  Recently vomited and unable to tolerate po contrast at this time.  On re-examination, pt in NAD, VSS, abd diffusely, mildly tender w/out guarding or peritoneal signs.  10mg  IV reglan ordered for nausea and pt encouraged to keep working on contrast.  Will reassess shortly.  3:50 PM   Pt has returned from CT.  Pain currently 5/10.  Nausea reportedly mild but patient holding vomit bag  CT shows partial SBO at mid small bowel w/ associated distension of stomach and proximal duodenum.  NG tube as well as another dose zofran/morphine ordered.  U/A pos for infection.  Will treat w/ IV rocephin.  [facility] consulted for admission.    Brandi Mendez [place], [place] 04/24/11 1908

## 2011-04-24 NOTE — ED Provider Notes (Addendum)
Medical screening examination/treatment/procedure(s) were conducted as a shared visit with non-physician practitioner(s) and myself.  I personally evaluated the patient during the encounter.  Min diffuse abdominal ttp. No r/g.  No blood in stool per pt. MM dry. Hydration, CT A/P ordered by PA.        Forbes Cellar, MD 04/24/11 1625  Forbes Cellar, MD 04/24/11 [phone]

## 2011-04-24 NOTE — ED Notes (Signed)
Rad Tech brought over contrast for pt to drink.

## 2011-04-25 ENCOUNTER — Encounter (HOSPITAL_COMMUNITY): Payer: Self-pay | Admitting: Surgery

## 2011-04-25 DIAGNOSIS — K56609 Unspecified intestinal obstruction, unspecified as to partial versus complete obstruction: Secondary | ICD-10-CM

## 2011-04-25 DIAGNOSIS — R109 Unspecified abdominal pain: Secondary | ICD-10-CM

## 2011-04-25 DIAGNOSIS — R112 Nausea with vomiting, unspecified: Secondary | ICD-10-CM

## 2011-04-25 LAB — CBC
Hemoglobin: 12 g/dL (ref 12.0–15.0)
MCH: 31 pg (ref 26.0–34.0)
RBC: 3.87 MIL/uL (ref 3.87–5.11)
WBC: 7.2 10*3/uL (ref 4.0–10.5)

## 2011-04-25 LAB — COMPREHENSIVE METABOLIC PANEL
AST: 13 U/L (ref 0–37)
Albumin: 3.4 g/dL — ABNORMAL LOW (ref 3.5–5.2)
BUN: 14 mg/dL (ref 6–23)
CO2: 26 mEq/L (ref 19–32)
Calcium: 8.8 mg/dL (ref 8.4–10.5)
Creatinine, Ser: 0.83 mg/dL (ref 0.50–1.10)
GFR calc non Af Amer: 78 mL/min — ABNORMAL LOW (ref >90)

## 2011-04-25 MED ORDER — CHLORHEXIDINE GLUCONATE 0.12 % MT SOLN
15.0000 mL | Freq: Two times a day (BID) | OROMUCOSAL | Status: DC
  Administered 2011-04-25 – 2011-04-27 (×5): 15 mL via OROMUCOSAL
  Filled 2011-04-25 (×4): qty 15

## 2011-04-25 MED ORDER — POTASSIUM CHLORIDE 20 MEQ/15ML (10%) PO LIQD
40.0000 meq | ORAL | Status: AC
  Administered 2011-04-25 (×2): 40 meq via ORAL
  Filled 2011-04-25 (×2): qty 30

## 2011-04-25 MED ORDER — WHITE PETROLATUM GEL
Status: AC
  Administered 2011-04-25: 16:00:00
  Filled 2011-04-25: qty 5

## 2011-04-25 MED ORDER — POTASSIUM CHLORIDE CRYS ER 20 MEQ PO TBCR
40.0000 meq | EXTENDED_RELEASE_TABLET | ORAL | Status: DC
  Filled 2011-04-25 (×2): qty 2

## 2011-04-25 MED ORDER — BIOTENE DRY MOUTH MT LIQD
15.0000 mL | Freq: Two times a day (BID) | OROMUCOSAL | Status: DC
  Administered 2011-04-25 – 2011-04-26 (×4): 15 mL via OROMUCOSAL

## 2011-04-25 NOTE — Consult Note (Signed)
Reason for Consult:SBO Referring Physician: Deirdre Priest MD  Brandi Mendez is an 56 y.o. female.  HPI:  2 day history of nausea,  Vomiting and abdominal pain.  Last BM and flatus 2 days ago.  No current flatus or BM.  CT obtained 04/24/2011 shows SBO.  Asked to see patient today for SBO.  Pt is currently  Comfortable.  No flatus and minimal pain.  History reviewed. No pertinent past medical history.  Past Surgical History  Procedure Date  . Abdominal hysterectomy   . Cholecystectomy   . Abdominal hernia repair     History reviewed. No pertinent family history.  Social History:  reports that she has never smoked. She does not have any smokeless tobacco history on file. She reports that she drinks alcohol. She reports that she does not use illicit drugs.  Allergies: No Known Allergies  Medications:  Prior to Admission:  Prescriptions prior to admission  Medication Sig Dispense Refill  . acetaminophen (TYLENOL) 500 MG tablet Take 500 mg by mouth every 6 (six) hours as needed. For pain.       Scheduled:   . acetaminophen      . antiseptic oral rinse  15 mL Mouth Rinse q12n4p  . cefTRIAXone (ROCEPHIN)  IV  1 g Intravenous Once  . cephALEXin  500 mg Oral Q12H  . chlorhexidine  15 mL Mouth Rinse BID  . heparin  5,000 Units Subcutaneous Q8H  . lidocaine      . metoCLOPramide (REGLAN) injection  10 mg Intravenous Once  .  morphine injection  4 mg Intravenous Once  . ondansetron (ZOFRAN) IV  4 mg Intravenous Once  . oxymetazoline      . pantoprazole (PROTONIX) IV  40 mg Intravenous Q24H  . potassium chloride  40 mEq Oral Q4H  . white petrolatum      . DISCONTD: potassium chloride  40 mEq Oral Q4H    Results for orders placed during the hospital encounter of 04/24/11 (from the past 48 hour(s))  CBC     Status: Normal   Collection Time   04/24/11 11:40 AM      Component Value Range Comment   WBC 8.4  4.0 - 10.5 (K/uL)    RBC 4.47  3.87 - 5.11 (MIL/uL)    Hemoglobin 14.4  12.0 -  15.0 (g/dL)    HCT 16.1  09.6 - 04.5 (%)    MCV 91.7  78.0 - 100.0 (fL)    MCH 32.2  26.0 - 34.0 (pg)    MCHC 35.1  30.0 - 36.0 (g/dL)    RDW 40.9  81.1 - 91.4 (%)    Platelets 243  150 - 400 (K/uL)   DIFFERENTIAL     Status: Abnormal   Collection Time   04/24/11 11:40 AM      Component Value Range Comment   Neutrophils Relative 81 (*) 43 - 77 (%)    Neutro Abs 6.7  1.7 - 7.7 (K/uL)    Lymphocytes Relative 12  12 - 46 (%)    Lymphs Abs 1.0  0.7 - 4.0 (K/uL)    Monocytes Relative 7  3 - 12 (%)    Monocytes Absolute 0.6  0.1 - 1.0 (K/uL)    Eosinophils Relative 1  0 - 5 (%)    Eosinophils Absolute 0.1  0.0 - 0.7 (K/uL)    Basophils Relative 0  0 - 1 (%)    Basophils Absolute 0.0  0.0 - 0.1 (K/uL)   COMPREHENSIVE  METABOLIC PANEL     Status: Abnormal   Collection Time   04/24/11 11:40 AM      Component Value Range Comment   Sodium 139  135 - 145 (mEq/L)    Potassium 3.3 (*) 3.5 - 5.1 (mEq/L)    Chloride 101  96 - 112 (mEq/L)    CO2 26  19 - 32 (mEq/L)    Glucose, Bld 116 (*) 70 - 99 (mg/dL)    BUN 16  6 - 23 (mg/dL)    Creatinine, Ser 5.78  0.50 - 1.10 (mg/dL)    Calcium 9.7  8.4 - 10.5 (mg/dL)    Total Protein 7.9  6.0 - 8.3 (g/dL)    Albumin 4.4  3.5 - 5.2 (g/dL)    AST 16  0 - 37 (U/L)    ALT 16  0 - 35 (U/L)    Alkaline Phosphatase 79  39 - 117 (U/L)    Total Bilirubin 1.0  0.3 - 1.2 (mg/dL)    GFR calc non Af Amer >90  >90 (mL/min)    GFR calc Af Amer >90  >90 (mL/min)   LIPASE, BLOOD     Status: Abnormal   Collection Time   04/24/11 11:40 AM      Component Value Range Comment   Lipase 10 (*) 11 - 59 (U/L)   URINALYSIS, ROUTINE W REFLEX MICROSCOPIC     Status: Abnormal   Collection Time   04/24/11  3:57 PM      Component Value Range Comment   Color, Urine AMBER (*) YELLOW  BIOCHEMICALS MAY BE AFFECTED BY COLOR   APPearance TURBID (*) CLEAR     Specific Gravity, Urine 1.029  1.005 - 1.030     pH 5.5  5.0 - 8.0     Glucose, UA NEGATIVE  NEGATIVE (mg/dL)    Hgb urine  dipstick MODERATE (*) NEGATIVE     Bilirubin Urine SMALL (*) NEGATIVE     Ketones, ur NEGATIVE  NEGATIVE (mg/dL)    Protein, ur 30 (*) NEGATIVE (mg/dL)    Urobilinogen, UA 1.0  0.0 - 1.0 (mg/dL)    Nitrite NEGATIVE  NEGATIVE     Leukocytes, UA MODERATE (*) NEGATIVE    URINE MICROSCOPIC-ADD ON     Status: Abnormal   Collection Time   04/24/11  3:57 PM      Component Value Range Comment   Squamous Epithelial / LPF MANY (*) RARE     WBC, UA TOO NUMEROUS TO COUNT  <3 (WBC/hpf)    RBC / HPF 7-10  <3 (RBC/hpf)    Bacteria, UA MANY (*) RARE     Urine-Other MUCOUS PRESENT     COMPREHENSIVE METABOLIC PANEL     Status: Abnormal   Collection Time   04/25/11  5:47 AM      Component Value Range Comment   Sodium 141  135 - 145 (mEq/L)    Potassium 3.3 (*) 3.5 - 5.1 (mEq/L)    Chloride 105  96 - 112 (mEq/L)    CO2 26  19 - 32 (mEq/L)    Glucose, Bld 129 (*) 70 - 99 (mg/dL)    BUN 14  6 - 23 (mg/dL)    Creatinine, Ser 4.69  0.50 - 1.10 (mg/dL)    Calcium 8.8  8.4 - 10.5 (mg/dL)    Total Protein 6.3  6.0 - 8.3 (g/dL)    Albumin 3.4 (*) 3.5 - 5.2 (g/dL)    AST 13  0 - 37 (  U/L)    ALT 12  0 - 35 (U/L)    Alkaline Phosphatase 62  39 - 117 (U/L)    Total Bilirubin 0.6  0.3 - 1.2 (mg/dL)    GFR calc non Af Amer 78 (*) >90 (mL/min)    GFR calc Af Amer >90  >90 (mL/min)   CBC     Status: Abnormal   Collection Time   04/25/11  5:47 AM      Component Value Range Comment   WBC 7.2  4.0 - 10.5 (K/uL)    RBC 3.87  3.87 - 5.11 (MIL/uL)    Hemoglobin 12.0  12.0 - 15.0 (g/dL)    HCT 16.1 (*) 09.6 - 46.0 (%)    MCV 92.2  78.0 - 100.0 (fL)    MCH 31.0  26.0 - 34.0 (pg)    MCHC 33.6  30.0 - 36.0 (g/dL)    RDW 04.5  40.9 - 81.1 (%)    Platelets 233  150 - 400 (K/uL)     Ct Abdomen Pelvis W Contrast  04/24/2011  *RADIOLOGY REPORT*  Clinical Data: Diffuse abdominal pain since last night.  Nausea and diarrhea.  Vomiting.  Rule out diverticulitis.  CT ABDOMEN AND PELVIS WITH CONTRAST  Technique:   Multidetector CT imaging of the abdomen and pelvis was performed following the standard protocol during bolus administration of intravenous contrast.  Contrast: OMNIPAQUE IOHEXOL 300 MG/ML  SOLN  Comparison: 02/25/2010  Findings: Calcified granuloma at the right lung base. Normal heart size.  Mild hepatic steatosis.  No focal liver lesions.  Normal spleen.  The stomach is mildly dilated.  The proximal duodenum is also mildly dilated and contrast filled.  Mild pancreatic atrophy. Cholecystectomy without biliary ductal dilatation.  Normal adrenal glands.  Interpolar left renal cyst. Normal right kidney. No retroperitoneal or retrocrural adenopathy.  The cecum is positioned within the pelvis.  The appendix is likely identified on image 62.  Proximal and mid small bowel loops are moderately dilated.  Example loop 3.8 cm on image 42. This dilatation continues to the level of a transition in the region of surgical clips in the periumbilical mid abdomen.  Example images 46 - 61 of series 2.  No obstructing mass.  No pneumatosis or wall thickening.  There may be minimal mesenteric interloop edema on image 56 of series 2.  No pelvic adenopathy.    Normal urinary bladder.  Hysterectomy.  No adnexal mass.  No significant free fluid.  No acute osseous abnormality.  Minimal periumbilical abdominal wall laxity versus fat containing hernia.  Just cephalad the presumed small bowel obstruction site.  IMPRESSION:  1.  Findings of partial small bowel obstruction at the mid small bowel.  Likely secondary to adhesions.  Distention of the stomach and proximal duodenum for which  nasogastric tube placement should be considered. 2.  No evidence of diverticulitis. 3.  Mild hepatic steatosis.  Original Report Authenticated By: Consuello Bossier, M.D.    Review of Systems  Constitutional: Negative.   HENT: Negative.   Respiratory: Negative.   Cardiovascular: Negative.   Gastrointestinal: Positive for nausea, vomiting and abdominal  pain.  Genitourinary: Negative.   Musculoskeletal: Negative.   Skin: Negative.   Neurological: Negative.   Endo/Heme/Allergies: Negative.   Psychiatric/Behavioral: Negative.    Blood pressure 91/60, pulse 62, temperature 98.3 F (36.8 C), temperature source Oral, resp. rate 18, height 5\' 3"  (1.6 m), weight 184 lb 11.9 oz (83.8 kg), SpO2 96.00%. Physical Exam  Constitutional: She  is oriented to person, place, and time. She appears well-developed and well-nourished.  HENT:  Head: Normocephalic and atraumatic.  Eyes: EOM are normal. Pupils are equal, round, and reactive to light.  Neck: Normal range of motion. Neck supple.  Cardiovascular: Normal rate and regular rhythm.   Respiratory: Effort normal.  GI: Soft. She exhibits distension. There is no tenderness. There is no rebound and no guarding.  Musculoskeletal: Normal range of motion.  Neurological: She is alert and oriented to person, place, and time.  Skin: Skin is warm and dry.  Psychiatric: She has a normal mood and affect. Judgment and thought content normal.    Assessment/Plan: SBO Agree with bowel rest and NGT.  Check films in AM.  If no improvement in next 24 -48 hours,  Pt may need ex lap.  Keep NPO.  Jalaysia Lobb A. 04/25/2011, 2:43 PM

## 2011-04-25 NOTE — Progress Notes (Signed)
See my notes in [facility]

## 2011-04-25 NOTE — Progress Notes (Signed)
Subjective: Interval History: has complaints mild lower abdominal pain. Reports improvement in nausea. Denies flatus or stool. Has had no repeat episodes of emesis.   Objective: Vital signs in last 24 hours: Temp:  [98 F (36.7 C)-98.7 F (37.1 C)] 98.3 F (36.8 C) (04/21 0619) Pulse Rate:  [59-75] 62  (04/21 0619) Resp:  [16-20] 18  (04/21 0619) BP: (91-120)/(56-91) 91/60 mmHg (04/21 0619) SpO2:  [95 %-100 %] 96 % (04/21 0619) Weight:  [184 lb 11.9 oz (83.8 kg)] 184 lb 11.9 oz (83.8 kg) (04/21 0619)  Intake/Output from previous day: 04/20 0701 - 04/21 0700 In: -  Out: 1200  Intake/Output this shift:    General appearance: alert, cooperative, no distress and NG tube in place and to suction.  Lungs: clear to auscultation bilaterally Heart: regular rate and rhythm, S1, S2 normal, no murmur, click, rub or gallop Abdomen: hyperactive BS, soft, non-tender, , non disteneded. No rebound or guarding.  Extremities: extremities normal, atraumatic, no cyanosis or edema  Labs/Studies:  WBC 8.4 > 7.2 K 3.3 > 3.3  Cr 0.73 > 0.83  Ct Abdomen Pelvis W Contrast 04/24/2011:  Findings of partial small bowel obstruction at the mid small bowel.  Likely secondary to adhesions.  Distention of the stomach and proximal duodenum for which  nasogastric tube placement should be considered. 2.  No evidence of diverticulitis. 3.  Mild hepatic steatosis.   Scheduled Meds:    . acetaminophen      . antiseptic oral rinse  15 mL Mouth Rinse q12n4p  . cefTRIAXone (ROCEPHIN)  IV  1 g Intravenous Once  . cephALEXin  500 mg Oral Q12H  . chlorhexidine  15 mL Mouth Rinse BID  . fentaNYL  50 mcg Intravenous Once  . heparin  5,000 Units Subcutaneous Q8H  . iohexol  20 mL Oral Q1 Hr x 2  . lidocaine      . metoCLOPramide (REGLAN) injection  10 mg Intravenous Once  .  morphine injection  4 mg Intravenous Once  .  morphine injection  4 mg Intravenous Once  . ondansetron  4 mg Intravenous Once  . ondansetron  4  mg Intravenous Once  . ondansetron (ZOFRAN) IV  4 mg Intravenous Once  . oxymetazoline      . pantoprazole (PROTONIX) IV  40 mg Intravenous Q24H  . sodium chloride  500 mL Intravenous Once  . white petrolatum       Continuous Infusions:    . dextrose 5 % and 0.9 % NaCl with KCl 20 mEq/L 125 mL/hr at 04/24/11 2321  . DISCONTD: sodium chloride 125 mL/hr (04/24/11 1434)   PRN Meds:iohexol, morphine, ondansetron (ZOFRAN) IV, ondansetron  Assessment/Plan: Assessment/Plan  56 yo F with history of abdominal surgeries presenting with recurrent small bowel obstruction  1. Abdominal pain- Improving. Plan: -Continue NG tube to suction -OOB to walk to promote gastric motility -Consult general surgery for recommendations regarding managing recurrent small bowel obstruction.  -Maintain NPO for now pending surgery recommendations with plan to advance diet slowly.  - Zofran prn for nausea  - Morphine 2mg  q2 hours for pain control  - Protonix IV  - Continue IVF D5NS +20 mEq KCl @125cc /hr   2. Asymptomatic bacteruria - UA shows urine with leukocytes and many bacteria, nitrite negative. Treated with Rocephin x 1 the ED. The patient is asymptomatic.  - Urine culture sent  - Will treat with keflex pending results of urine culture.   3. FEN/GI-  Hypokalemia. NPO except for meds  asking for food Plan:   - replete with KDur 40 mEq x 2 doses.   -NPO except for medications. -IVF D5NS+ KCL @ 125cc/hr.  -Protonix  -Zofran prn    4. PPx- Heparin sq for DVT prophylaxis. Protonix IV while NPO   5. Dispo- Pending clinical improvement and general surgery recommendations.   Code: Limited (DNI)    LOS: 1 day   Anjanae Woehrle 04/25/11; 11:40 AM.

## 2011-04-25 NOTE — H&P (Signed)
Family Medicine Teaching Service Attending Note  I interviewed and examined patient Brandi Mendez and reviewed their tests and x-rays.  I discussed with Dr. Ashley Royalty and reviewed their note for today.  I agree with their assessment and plan.     Additionally  This am feels better still large amount of NG output Agree with surgery consult for repeated SBOs Out of bed

## 2011-04-26 ENCOUNTER — Inpatient Hospital Stay (HOSPITAL_COMMUNITY): Payer: Self-pay

## 2011-04-26 LAB — BASIC METABOLIC PANEL
CO2: 24 mEq/L (ref 19–32)
Calcium: 8.8 mg/dL (ref 8.4–10.5)
Chloride: 107 mEq/L (ref 96–112)
Glucose, Bld: 125 mg/dL — ABNORMAL HIGH (ref 70–99)
Sodium: 140 mEq/L (ref 135–145)

## 2011-04-26 LAB — GLUCOSE, CAPILLARY: Glucose-Capillary: 99 mg/dL (ref 70–99)

## 2011-04-26 NOTE — Progress Notes (Signed)
Patient ID: Brandi Mendez, female   DOB: 09/02/1955, 56 y.o.   MRN: [id number]    Subjective: Pt feels ok.  No pain.  + flatus and BM yesterday.  No nausea with NG clamped to go down for x-ray.  Objective: Vital signs in last 24 hours: Temp:  [97.5 F (36.4 C)-98.4 F (36.9 C)] 97.6 F (36.4 C) (04/22 0600) Pulse Rate:  [50-64] 64  (04/22 0600) Resp:  [18] 18  (04/22 0600) BP: (100-106)/(59-68) 106/62 mmHg (04/22 0600) SpO2:  [98 %-99 %] 99 % (04/22 0600) Weight:  [184 lb 11.9 oz (83.8 kg)] 184 lb 11.9 oz (83.8 kg) (04/22 0600) Last BM Date: 04/25/11  Intake/Output from previous day: 04/21 0701 - 04/22 0700 In: 1500 [I.V.:1500] Out: 3150 [Urine:1600; Emesis/NG output:1550] Intake/Output this shift: Total I/O In: -  Out: 250 [Urine:250]  PE: Abd: soft, NT, ND, +BS, NGT clamped, but some bilious output in cannister Heart: regular Lungs: CTAB  Lab Results:   Basename 04/25/11 0547 04/24/11 1140  WBC 7.2 8.4  HGB 12.0 14.4  HCT 35.7* 41.0  PLT 233 243   BMET  Basename 04/26/11 0452 04/25/11 0547  NA 140 141  K 3.9 3.3*  CL 107 105  CO2 24 26  GLUCOSE 125* 129*  BUN 9 14  CREATININE 0.70 0.83  CALCIUM 8.8 8.8   PT/INR No results found for this basename: LABPROT:2,INR:2 in the last 72 hours CMP     Component Value Date/Time   NA 140 04/26/2011 0452   K 3.9 04/26/2011 0452   CL 107 04/26/2011 0452   CO2 24 04/26/2011 0452   GLUCOSE 125* 04/26/2011 0452   BUN 9 04/26/2011 0452   CREATININE 0.70 04/26/2011 0452   CALCIUM 8.8 04/26/2011 0452   PROT 6.3 04/25/2011 0547   ALBUMIN 3.4* 04/25/2011 0547   AST 13 04/25/2011 0547   ALT 12 04/25/2011 0547   ALKPHOS 62 04/25/2011 0547   BILITOT 0.6 04/25/2011 0547   GFRNONAA >90 04/26/2011 0452   GFRAA >90 04/26/2011 0452   Lipase     Component Value Date/Time   LIPASE 10* 04/24/2011 1140       Studies/Results: Dg Abd 1 View  04/26/2011  *RADIOLOGY REPORT*  Clinical Data: Abdominal pain, small bowel obstruction  ABDOMEN  - 1 VIEW  Comparison: CT abdomen pelvis dated 04/24/2011  Findings: Mildly prominent loops of small bowel in the left mid abdomen, compatible with partial small bowel obstruction.  Contrast has passed into the transverse colon.  Enteric tube terminating in the proximal duodenum.  Cholecystectomy clips.  Ventral hernia repair.  Surgical clips in the pelvis.  Visualized osseous structures are within normal limits.  IMPRESSION: Mildly prominent loops of small bowel in the left mid abdomen, compatible with partial small bowel obstruction.  Original Report Authenticated By: Charline Bills, M.D.   Ct Abdomen Pelvis W Contrast  04/24/2011  *RADIOLOGY REPORT*  Clinical Data: Diffuse abdominal pain since last night.  Nausea and diarrhea.  Vomiting.  Rule out diverticulitis.  CT ABDOMEN AND PELVIS WITH CONTRAST  Technique:  Multidetector CT imaging of the abdomen and pelvis was performed following the standard protocol during bolus administration of intravenous contrast.  Contrast: OMNIPAQUE IOHEXOL 300 MG/ML  SOLN  Comparison: 02/25/2010  Findings: Calcified granuloma at the right lung base. Normal heart size.  Mild hepatic steatosis.  No focal liver lesions.  Normal spleen.  The stomach is mildly dilated.  The proximal duodenum is also mildly dilated and contrast filled.  Mild pancreatic atrophy. Cholecystectomy without biliary ductal dilatation.  Normal adrenal glands.  Interpolar left renal cyst. Normal right kidney. No retroperitoneal or retrocrural adenopathy.  The cecum is positioned within the pelvis.  The appendix is likely identified on image 62.  Proximal and mid small bowel loops are moderately dilated.  Example loop 3.8 cm on image 42. This dilatation continues to the level of a transition in the region of surgical clips in the periumbilical mid abdomen.  Example images 46 - 61 of series 2.  No obstructing mass.  No pneumatosis or wall thickening.  There may be minimal mesenteric interloop edema on  image 56 of series 2.  No pelvic adenopathy.    Normal urinary bladder.  Hysterectomy.  No adnexal mass.  No significant free fluid.  No acute osseous abnormality.  Minimal periumbilical abdominal wall laxity versus fat containing hernia.  Just cephalad the presumed small bowel obstruction site.  IMPRESSION:  1.  Findings of partial small bowel obstruction at the mid small bowel.  Likely secondary to adhesions.  Distention of the stomach and proximal duodenum for which  nasogastric tube placement should be considered. 2.  No evidence of diverticulitis. 3.  Mild hepatic steatosis.  Original Report Authenticated By: Consuello Bossier, M.D.    Anti-infectives: Anti-infectives     Start     Dose/Rate Route Frequency Ordered Stop   04/24/11 2200   cephALEXin (KEFLEX) capsule 500 mg  Status:  Discontinued        500 mg Oral Every 12 hours 04/24/11 2059 04/25/11 1722   04/24/11 1800   cefTRIAXone (ROCEPHIN) 1 g in dextrose 5 % 50 mL IVPB        1 g 100 mL/hr over 30 Minutes Intravenous  Once 04/24/11 1756 04/24/11 1843           Assessment/Plan  1. The patient is passing flatus and had a BM yesterday.  Her x-rays show contrast in her colon and minimal LUQ bowel dilatation.    Will try and clamp NGT for 6 hours today.  If she does well with this and had a residual of less than 150cc, then we will dc her NGT.    LOS: 2 days    OSBORNE,KELLY E 04/26/2011  Agree.  Will slowly adv diet s/p NGT removal Mobilize more

## 2011-04-26 NOTE — Progress Notes (Signed)
PGY-1 Daily Progress Note Family Medicine Teaching Service Brandi Borum M. [name], MD Service Pager: [phone]  Subjective: Patient reports that she is feeling better. No nausea. Loose stool yesterday and passing gas today. Abdominal pain has improved  Objective: Vital signs in last 24 hours: Temp:  [97.5 F (36.4 C)-98.4 F (36.9 C)] 97.6 F (36.4 C) (04/22 0600) Pulse Rate:  [50-64] 64  (04/22 0600) Resp:  [18] 18  (04/22 0600) BP: (100-106)/(59-68) 106/62 mmHg (04/22 0600) SpO2:  [98 %-99 %] 99 % (04/22 0600) Weight:  [184 lb 11.9 oz (83.8 kg)] 184 lb 11.9 oz (83.8 kg) (04/22 0600) Weight change: 0 lb (0 kg) Last BM Date: 04/25/11  Intake/Output from previous day: 04/21 0701 - 04/22 0700 In: 1500 [I.V.:1500] Out: 3150 [Urine:1600; Emesis/NG output:1550] Intake/Output this shift: Total I/O In: -  Out: 250 [Urine:250]  Physical Exam: General appearance: alert, cooperative, no distress and NG tube in place but clamped Lungs: clear to auscultation bilaterally  Heart: regular rate and rhythm Abdomen: +BS, soft, non-tender, non disteneded. No rebound or guarding.  Extremities: extremities normal, atraumatic, no cyanosis or edema  Lab Results:  Basename 04/25/11 0547 04/24/11 1140  WBC 7.2 8.4  HGB 12.0 14.4  HCT 35.7* 41.0  PLT 233 243   BMET  Basename 04/26/11 0452 04/25/11 0547  NA 140 141  K 3.9 3.3*  CL 107 105  CO2 24 26  GLUCOSE 125* 129*  BUN 9 14  CREATININE 0.70 0.83  CALCIUM 8.8 8.8    Studies/Results: Dg Abd 1 View  04/26/2011  *RADIOLOGY REPORT*  Clinical Data: Abdominal pain, small bowel obstruction  ABDOMEN - 1 VIEW  Comparison: CT abdomen pelvis dated 04/24/2011  Findings: Mildly prominent loops of small bowel in the left mid abdomen, compatible with partial small bowel obstruction.  Contrast has passed into the transverse colon.  Enteric tube terminating in the proximal duodenum.  Cholecystectomy clips.  Ventral hernia repair.  Surgical clips in the  pelvis.  Visualized osseous structures are within normal limits.  IMPRESSION: Mildly prominent loops of small bowel in the left mid abdomen, compatible with partial small bowel obstruction.  Original Report Authenticated By: Charline Bills, M.D.   Ct Abdomen Pelvis W Contrast  04/24/2011  *RADIOLOGY REPORT*  Clinical Data: Diffuse abdominal pain since last night.  Nausea and diarrhea.  Vomiting.  Rule out diverticulitis.  CT ABDOMEN AND PELVIS WITH CONTRAST  Technique:  Multidetector CT imaging of the abdomen and pelvis was performed following the standard protocol during bolus administration of intravenous contrast.  Contrast: OMNIPAQUE IOHEXOL 300 MG/ML  SOLN  Comparison: 02/25/2010  Findings: Calcified granuloma at the right lung base. Normal heart size.  Mild hepatic steatosis.  No focal liver lesions.  Normal spleen.  The stomach is mildly dilated.  The proximal duodenum is also mildly dilated and contrast filled.  Mild pancreatic atrophy. Cholecystectomy without biliary ductal dilatation.  Normal adrenal glands.  Interpolar left renal cyst. Normal right kidney. No retroperitoneal or retrocrural adenopathy.  The cecum is positioned within the pelvis.  The appendix is likely identified on image 62.  Proximal and mid small bowel loops are moderately dilated.  Example loop 3.8 cm on image 42. This dilatation continues to the level of a transition in the region of surgical clips in the periumbilical mid abdomen.  Example images 46 - 61 of series 2.  No obstructing mass.  No pneumatosis or wall thickening.  There may be minimal mesenteric interloop edema on image 56 of  series 2.  No pelvic adenopathy.    Normal urinary bladder.  Hysterectomy.  No adnexal mass.  No significant free fluid.  No acute osseous abnormality.  Minimal periumbilical abdominal wall laxity versus fat containing hernia.  Just cephalad the presumed small bowel obstruction site.  IMPRESSION:  1.  Findings of partial small bowel  obstruction at the mid small bowel.  Likely secondary to adhesions.  Distention of the stomach and proximal duodenum for which  nasogastric tube placement should be considered. 2.  No evidence of diverticulitis. 3.  Mild hepatic steatosis.  Original Report Authenticated By: Consuello Bossier, M.D.    Medications:  I have reviewed the patient's current medications. Scheduled:   . acetaminophen      . antiseptic oral rinse  15 mL Mouth Rinse q12n4p  . chlorhexidine  15 mL Mouth Rinse BID  . heparin  5,000 Units Subcutaneous Q8H  . pantoprazole (PROTONIX) IV  40 mg Intravenous Q24H  . potassium chloride  40 mEq Oral Q4H  . white petrolatum      . DISCONTD: cephALEXin  500 mg Oral Q12H  . DISCONTD: potassium chloride  40 mEq Oral Q4H   Continuous:   . dextrose 5 % and 0.9 % NaCl with KCl 20 mEq/L 125 mL/hr at 04/26/11 0014   ZOX:[id number], ondansetron (ZOFRAN) IV, ondansetron  Assessment/Plan: 56 yo F with history of abdominal surgeries presenting with recurrent small bowel obstruction   1. Abdominal pain- Pain secondary to partial SBO seen on CT scan on admission, likely recurrent secondary to multiple abdominal surgeries. Improving.  -NGT clamped. Patient tolerating well. If she does well in 6 hours, will pull NGT and monitor. -OOB to walk to promote gastric motility  -We appreciate general surgery consult. -Maintain NPO for now pending NGT removal with plan to advance diet slowly.  -Zofran prn for nausea  -Morphine 2mg  q2 hours for pain control  -Protonix IV  -Continue IVF D5NS +20 mEq KCl @125cc /hr   2. Asymptomatic bacteruria - UA shows urine with leukocytes and many bacteria, nitrite negative. Treated with Rocephin x 1 the ED. The patient is asymptomatic.  -Urine culture ordered, but cancelled due to not receiving requisition in the lab -Will continue treating with Keflex for now, but likely not continue at discharge.  3. FEN/GI- Hypokalemia. NPO except for meds -Continue NPO  except for medications while NGT in place -IVF D5NS+ KCL @ 125cc/hr.  -Protonix  -Zofran prn   4. PPx- Heparin sq for DVT prophylaxis. Protonix IV while NPO   5. Dispo- Pending clinical improvement  Code: Limited (DNI)   LOS: 2 days   Landrey Mahurin 04/26/2011, 7:56 AM

## 2011-04-26 NOTE — Progress Notes (Signed)
04-26-11 UR completed. Future [place] RN BSN  

## 2011-04-26 NOTE — Progress Notes (Signed)
Seen and examined.  Agree that she is improving and with clamping the NG tube.  Educated about cause and recurrent nature of SBO.

## 2011-04-27 ENCOUNTER — Encounter (HOSPITAL_COMMUNITY): Payer: Self-pay | Admitting: Family Medicine

## 2011-04-27 NOTE — Progress Notes (Signed)
PSBO resolving Mobilize 1 hour/day  The patient is stable.  There is no evidence of peritonitis, acute abdomen, nor shock.  There is no strong evidence of failure of improvement nor decline with current non-operative management.  There is no need for surgery at the present moment.  Call w questions.

## 2011-04-27 NOTE — Progress Notes (Signed)
Met with pt who states that she is seen in the clinic and has an orange card for medications.  Is interested in [facility] if she is eligible.  Johny Shock RN MPH Case Manager [phone]

## 2011-04-27 NOTE — Progress Notes (Signed)
Met with pt earlier today and referral made to [facility] for case management and access to other community resources.  Johny Shock RN MPH Case Manager [phone]

## 2011-04-27 NOTE — Discharge Summary (Signed)
Seen and examined.  She tolerated NG tube removal and advancing of diet.  Feels fine.  Agree with DC as outlined by Dr. Clinton Sawyer.

## 2011-04-27 NOTE — Progress Notes (Signed)
Patient ID: Brandi Mendez, female   DOB: [birth year], 56 y.o.   MRN: [id number]    Subjective: Pt feels great.  Tolerating clears, no nausea.  Had 2 BMs today.  Objective: Vital signs in last 24 hours: Temp:  [97.3 F (36.3 C)-98.2 F (36.8 C)] 97.4 F (36.3 C) (04/23 0850) Pulse Rate:  [49-62] 60  (04/23 0850) Resp:  [13-18] 13  (04/23 0850) BP: (104-123)/(66-80) 123/80 mmHg (04/23 0850) SpO2:  [95 %-100 %] 100 % (04/23 0850) Last BM Date: 04/25/11  Intake/Output from previous day: 04/22 0701 - 04/23 0700 In: 2712 [P.O.:120; I.V.:2592] Out: 2550 [Urine:2450; Emesis/NG output:100] Intake/Output this shift: Total I/O In: -  Out: 500 [Urine:500]  PE: Abd: soft, NT, Nd, +BS Heart: regular Lungs: CTAB  Lab Results:   Basename 04/25/11 0547 04/24/11 1140  WBC 7.2 8.4  HGB 12.0 14.4  HCT 35.7* 41.0  PLT 233 243   BMET  Basename 04/26/11 0452 04/25/11 0547  NA 140 141  K 3.9 3.3*  CL 107 105  CO2 24 26  GLUCOSE 125* 129*  BUN 9 14  CREATININE 0.70 0.83  CALCIUM 8.8 8.8   PT/INR No results found for this basename: LABPROT:2,INR:2 in the last 72 hours CMP     Component Value Date/Time   NA 140 04/26/2011 0452   K 3.9 04/26/2011 0452   CL 107 04/26/2011 0452   CO2 24 04/26/2011 0452   GLUCOSE 125* 04/26/2011 0452   BUN 9 04/26/2011 0452   CREATININE 0.70 04/26/2011 0452   CALCIUM 8.8 04/26/2011 0452   PROT 6.3 04/25/2011 0547   ALBUMIN 3.4* 04/25/2011 0547   AST 13 04/25/2011 0547   ALT 12 04/25/2011 0547   ALKPHOS 62 04/25/2011 0547   BILITOT 0.6 04/25/2011 0547   GFRNONAA >90 04/26/2011 0452   GFRAA >90 04/26/2011 0452   Lipase     Component Value Date/Time   LIPASE 10* 04/24/2011 1140       Studies/Results: Dg Abd 1 View  04/26/2011  *RADIOLOGY REPORT*  Clinical Data: Abdominal pain, small bowel obstruction  ABDOMEN - 1 VIEW  Comparison: CT abdomen pelvis dated 04/24/2011  Findings: Mildly prominent loops of small bowel in the left mid abdomen, compatible with  partial small bowel obstruction.  Contrast has passed into the transverse colon.  Enteric tube terminating in the proximal duodenum.  Cholecystectomy clips.  Ventral hernia repair.  Surgical clips in the pelvis.  Visualized osseous structures are within normal limits.  IMPRESSION: Mildly prominent loops of small bowel in the left mid abdomen, compatible with partial small bowel obstruction.  Original Report Authenticated By: Charline Bills, M.D.    Anti-infectives: Anti-infectives     Start     Dose/Rate Route Frequency Ordered Stop   04/24/11 2200   cephALEXin (KEFLEX) capsule 500 mg  Status:  Discontinued        500 mg Oral Every 12 hours 04/24/11 2059 04/25/11 1722   04/24/11 1800   cefTRIAXone (ROCEPHIN) 1 g in dextrose 5 % 50 mL IVPB        1 g 100 mL/hr over 30 Minutes Intravenous  Once 04/24/11 1756 04/24/11 1843           Assessment/Plan  1. PSBO, resolved  Plan: 1. Advance to regular diet.  If tolerates is stable for discharge home from our standpoint.   LOS: 3 days    Akul Leggette E 04/27/2011

## 2011-04-27 NOTE — Discharge Summary (Signed)
Physician Discharge Summary  Patient ID: TALOR DESROSIERS MRN: [id number] DOB/AGE: 09/28/1955 56 y.o.  Admit date: 04/24/2011 Discharge date: 04/27/2011  Admission Diagnoses: Small Bowel Obstruction  Asymptomatic Bacteruria  Hypokalemia   Discharge Diagnoses:  Principal Problem:  *SMALL BOWEL OBSTRUCTION Active Problems:  UTI (lower urinary tract infection)   Discharged Condition: good  Hospital Course:  Mrs. Cormier is a 56 year old female with a history of multiple small bowel obstructions presumed secondary to adhesions resulting from abdominal procedures for a hysterectomy, a cholecystectomy, and  a hernia repair. She presented with nausea, vomiting, and abdominal pain. An NG tube was placed, and she was maintained NPO. She was slowly advance to a clear diet and the NG tube was removed. She tolerated a regular diet in day 4 and was appropriate for discharge. The general surgery team was actively managing the patient as well and agreed with the plan.   Of note, her CBG was consistently elevated while in the hospital. A follow-up on her A1C is warranted.   Consults: general surgery  Significant Diagnostic Studies:   CT Abdomen and Pelvis IMPRESSION:  1. Findings of partial small bowel obstruction at the mid small  bowel. Likely secondary to adhesions. Distention of the stomach  and proximal duodenum for which nasogastric tube placement should  be considered.  2. No evidence of diverticulitis.  3. Mild hepatic steatosis.   Treatments: see HPI  Discharge Exam: Blood pressure 113/72, pulse 55, temperature 97.5 F (36.4 C), temperature source Oral, resp. rate 16, height 5\' 3"  (1.6 m), weight 184 lb 11.9 oz (83.8 kg), SpO2 100.00%. General appearance: alert, cooperative, no distress  Lungs: clear to auscultation bilaterally  Heart: regular rate and rhythm  Abdomen: +BS, soft, non-tender, non disteneded. No rebound or guarding.  Extremities: extremities normal, atraumatic, no  cyanosis or edema   Disposition: 01-Home or Self Care  Discharge Orders    Future Appointments: Provider: Department: Dept Phone: Center:   05/05/2011 8:45 AM Floydene Flock, MD [facility] [phone] [facility]     Future Orders Please Complete By Expires   Diet - low sodium heart healthy        Medication List  As of 04/27/2011  2:33 PM   TAKE these medications         acetaminophen 500 MG tablet   Commonly known as: TYLENOL   Take 500 mg by mouth every 6 (six) hours as needed. For pain.           Follow-up Information    Follow up with Doree Albee, MD on 05/05/2011. (8:45 AM)    Contact information:   [address] [place] [address] [phone]          Signed: Mat Carne 04/27/2011, 2:33 PM

## 2011-05-05 ENCOUNTER — Encounter: Payer: Self-pay | Admitting: Family Medicine

## 2011-05-05 ENCOUNTER — Ambulatory Visit (INDEPENDENT_AMBULATORY_CARE_PROVIDER_SITE_OTHER): Payer: Self-pay | Admitting: Family Medicine

## 2011-05-05 VITALS — BP 114/70 | HR 62 | Ht 63.0 in | Wt 180.0 lb

## 2011-05-05 DIAGNOSIS — K56609 Unspecified intestinal obstruction, unspecified as to partial versus complete obstruction: Secondary | ICD-10-CM

## 2011-05-05 DIAGNOSIS — E785 Hyperlipidemia, unspecified: Secondary | ICD-10-CM

## 2011-05-05 DIAGNOSIS — E669 Obesity, unspecified: Secondary | ICD-10-CM

## 2011-05-05 LAB — COMPREHENSIVE METABOLIC PANEL
ALT: 38 U/L — ABNORMAL HIGH (ref 0–35)
AST: 16 U/L (ref 0–37)
Albumin: 4.6 g/dL (ref 3.5–5.2)
BUN: 18 mg/dL (ref 6–23)
Calcium: 9.8 mg/dL (ref 8.4–10.5)
Chloride: 107 mEq/L (ref 96–112)
Potassium: 4 mEq/L (ref 3.5–5.3)
Sodium: 141 mEq/L (ref 135–145)
Total Protein: 7 g/dL (ref 6.0–8.3)

## 2011-05-05 LAB — POCT GLYCOSYLATED HEMOGLOBIN (HGB A1C): Hemoglobin A1C: 5.8

## 2011-05-05 MED ORDER — ASPIRIN 81 MG PO TBEC: 81.0000 mg | DELAYED_RELEASE_TABLET | Freq: Every day | ORAL | Status: AC

## 2011-05-05 MED ORDER — POLYETHYLENE GLYCOL 3350 17 GM/SCOOP PO POWD: 17.0000 g | Freq: Every day | ORAL | Status: AC

## 2011-05-05 NOTE — Patient Instructions (Signed)
HIGH-FIBER DIET OVERVIEW -- Eating a diet that is high in fiber has many potential health benefits, including a decreased risk of heart disease, stroke, and type 2 diabetes. Because high-fiber foods may be healthy for reasons other than their fiber content, the research has not always been able to determine if fiber is the healthful component. A high-fiber diet is a commonly recommended treatment for digestive problems, such as constipation, diarrhea, and hemorrhoids, although individual results vary widely, and the scientific evidence supporting these recommendations is weak. Fiber is normally found in beans, grains, vegetables, and fruits. However, most people do not eat as much fiber as is commonly recommended. This topic discusses what fiber is, why it is helpful, and how to increase dietary fiber. WHAT IS FIBER? -- There is no single dietary "fiber." Traditionally, fiber was considered that substance found in the outer layers of grains or plants and which was not digested in the intestines. Wheat bran, the outer layer of wheat grain, fit this model. We now know that "fiber" actually consists of a number of different substances. The term "dietary fiber" includes all of these substances and is now considered a better term than just "fiber." Most dietary fiber is not digested or absorbed, so it stays within the intestine where it modulates digestion of other foods and affects the consistency of stool. There are two types of fiber, each of which is thought to have its own benefits: Soluble fiber consists of a group of substances that is made of carbohydrates and dissolves in water. Examples of foods that contain soluble fiber include fruits, oats, barley, and legumes (peas and beans).  Insoluble fiber comes from plant cells walls and does not dissolve in water. Examples of foods that contain insoluble fiber include wheat, rye, and other grains. The traditional fiber, wheat bran, is a type of insoluble fiber.   Dietary fiber is the sum of all soluble and insoluble fiber. BENEFITS OF A HIGH-FIBER DIET -- The health effects of a high-fiber may depend to some extent on the type of fiber eaten. However, the difference between the health effects of two types of fiber are not very clear and may vary between individuals, so many providers encourage adding fiber in whatever way is easiest for the patient. There are several potential benefits of eating a diet with high-fiber content: Insoluble fiber (wheat bran, and some fruits and vegetables) has been recommended to treat digestive problems such as constipation, hemorrhoids, chronic diarrhea, and fecal incontinence. Fiber bulks the stool, making it softer and easier to pass. Fiber helps the stool pass regularly, although it is not a laxative. (See "Patient information: Constipation in adults (Beyond the Basics)" and "Patient information: Hemorrhoids (Beyond the Basics)" and "Patient information: Chronic diarrhea in adults (Beyond the Basics)".)  Soluble fiber (psyllium, pectin, wheat dextrin, and oat products) can reduce the risk of coronary artery disease and stroke by 40 to 50 percent (compared to a low fiber diet) [1,2].  Soluble fiber can also reduce the risk of developing type 2 diabetes. In people who have diabetes (type 1 and 2), soluble fiber can help to control blood glucose levels.  It is not clear if a high-fiber diet is beneficial for people with irritable bowel syndrome or diverticulosis. Fiber may be helpful for some people with these diagnoses while it may worsen symptoms in others. HOW MUCH FIBER DO I NEED? -- The recommended amount of dietary fiber is 20 to 35 grams per day. By reading the nutrition label on packaged  foods, it is possible to determine the number of grams of dietary fiber per serving (figure 1). Dietary sources of fiber -- The fiber content of many foods, including fruits and vegetables, is available in the table (table 1). Breakfast  cereals can be a good source of fiber. Some fruits and vegetables are particularly helpful in treating constipation, such as prunes and prune juice. Other sources of fiber -- For those who do not like high-fiber foods such as fruits, beans, and vegetables, a good source of fiber is unprocessed wheat bran; one to two tablespoons can be mixed with food. One tablespoon of wheat bran contains approximately 1.6 grams of fiber. In addition, a number of fiber supplements are available. Examples include psyllium, methylcellulose, wheat dextrin, and calcium polycarbophil. The dose of the fiber supplement should be increased slowly to prevent gas and cramping, and the supplement should be taken with adequate fluid. The fiber in these supplements is mostly of the soluble type. FIBER SIDE EFFECTS -- Adding fiber to the diet can have some side effects, such as abdominal bloating or gas. This can sometimes be minimized by starting with a small amount and slowly increasing until stools become softer and more frequent. However, many people, including those with irritable bowel syndrome, cannot tolerate fiber supplements and do better by not increasing fiber in their diet. (See "Patient information: Irritable bowel syndrome (Beyond the Basics)".) WHERE TO GET MORE INFORMATION -- Your healthcare provider is the best source of information for questions and concerns related to your medical problem. This article will be updated as needed on our web site ([email or web address]). Related topics for patients, as well as selected articles written for healthcare professionals, are also available. Some of the most relevant are listed below. Patient level information -- UpToDate offers two types of patient education materials. The Basics -- The Basics patient education pieces answer the four or five key questions a patient might have about a given condition. These articles are best for patients who want a general overview and who  prefer short, easy-to-read materials. Patient information: High-fiber diet (The Basics) Patient information: Managing loss of appetite and weight loss with cancer (The Basics) Patient information: Diet and health (The Basics) Patient information: Fecal incontinence (The Basics) Patient information: Diabetes and diet (The Basics) Patient information: Rectal prolapse in adults (The Basics) Patient information: Colostomy care (The Basics) Beyond the Basics -- Beyond the Basics patient education pieces are longer, more sophisticated, and more detailed. These articles are best for patients who want in-depth information and are comfortable with some medical jargon. Patient information: Constipation in adults (Beyond the Basics) Patient information: Hemorrhoids (Beyond the Basics) Patient information: Chronic diarrhea in adults (Beyond the Basics) Patient information: Irritable bowel syndrome (Beyond the Basics) Professional level information -- Professional level articles are designed to keep doctors and other health professionals up-to-date on the latest medical findings. These articles are thorough, long, and complex, and they contain multiple references to the research on which they are based. Professional level articles are best for people who are comfortable with a lot of medical terminology and who want to read the same materials their doctors are reading. Colorectal cancer: Epidemiology, risk factors, and protective factors Diet in the treatment and prevention of hypertension Lipid lowering with diet or dietary supplements Healthy diet in adults Management of chronic constipation in adults  The following organizations also provide reliable health information. [employer] (http://townsend.com/, available in Spanish) [employer] on Diabetes and Digestive and Kidney Diseases (CarFlippers.tn)  [employer] (https://ball-rodriguez.info/) [1-4] Use of UpToDate is subject to the Subscription and License Agreement.  REFERENCES 1. Gillman MW, [place], Otelia Santee, et al. Protective effect of fruits and vegetables on development of stroke in men. JAMA 1995; J2616871. 2. Barbette Merino MK, Koh-Banerjee P, Hu FB, et al. Intakes of whole grains, bran, and germ and the risk of coronary heart disease in men. Am J Clin Nutr 2004; 80:1492. 3. De Blanch, Franceschi S, Parpinel M, La Vecchia C. Fiber intake and risk of colorectal cancer. Cancer Epidemiol Biomarkers Prev 1998; 7:667. 4. Willett WC. Diet and cancer: an evolving picture. JAMA 2005; V5404523. Topic 1996 Version 11.0

## 2011-05-05 NOTE — Assessment & Plan Note (Addendum)
Somewhat improved from hospital discharge. Discussed avoiding high density foods like beef and discussed a high fiber diet. Will start patient on MiraLAX as well to help with GI transit as this is likely mechanical source of symptoms. GI red flags were discussed at length patient including any worsening symptoms for her to call us or to the emergency room. Will otherwise continue clinically follow.

## 2011-05-05 NOTE — Assessment & Plan Note (Addendum)
Checking general risk stratification  labs including A1c, direct LDL.

## 2011-05-05 NOTE — Progress Notes (Signed)
  Subjective:    Patient ID: Brandi Mendez, female    DOB: June 05, 1955, 56 y.o.   MRN: [id number]  HPI Patient presents today for hospital followup for small bowel obstruction. Patient is noted to be hospitalized for approximately 5 days for this. General surgery was managing patient at the time. Management consisted of NG tube placement. Per discharge summary patient was able to tolerate a regular diet by hospital day 4 and was subsequently discharged. Working diagnosis of call also small bowel obstruction secondary to adhesions from hysterectomy and other abdominal procedures. From a symptomatic standpoint, patient states that she's been eating less than her usual diet but is otherwise asymptomatic apart from having a episode of recurrent nausea and vomiting on Saturday of over the weekend. But, otherwise asymptomatic. Patient states she has not had a bowel movement since she left the hospital approximately 7 days ago. Patient denies any fever, chills, hematemesis, dysuria, abdominal pain. Her diet has been consisting of a lot of meats including hamburgers and steak.   Review of Systems See HPI    Objective:   Physical Exam Gen: up in chair, NAD HEENT: NCAT, EOMI, TMs clear bilaterally CV: RRR, no murmurs auscultated PULM: CTAB, no wheezes, rales, rhoncii ABD: S/NT/+ bowel sounds  EXT: 2+ peripheral pulses    Assessment & Plan:

## 2011-06-28 ENCOUNTER — Ambulatory Visit: Payer: Self-pay | Admitting: Family Medicine

## 2011-08-10 ENCOUNTER — Encounter: Payer: Self-pay | Admitting: Internal Medicine

## 2011-08-10 ENCOUNTER — Ambulatory Visit (INDEPENDENT_AMBULATORY_CARE_PROVIDER_SITE_OTHER): Payer: Medicaid Other | Admitting: Internal Medicine

## 2011-08-10 ENCOUNTER — Encounter: Payer: Medicaid Other | Admitting: Internal Medicine

## 2011-08-10 VITALS — BP 140/86 | HR 72 | Temp 96.8°F | Ht 66.0 in | Wt 189.8 lb

## 2011-08-10 DIAGNOSIS — K219 Gastro-esophageal reflux disease without esophagitis: Secondary | ICD-10-CM

## 2011-08-10 DIAGNOSIS — J45909 Unspecified asthma, uncomplicated: Secondary | ICD-10-CM

## 2011-08-10 DIAGNOSIS — Z79899 Other long term (current) drug therapy: Secondary | ICD-10-CM

## 2011-08-10 DIAGNOSIS — E876 Hypokalemia: Secondary | ICD-10-CM

## 2011-08-10 DIAGNOSIS — R3129 Other microscopic hematuria: Secondary | ICD-10-CM

## 2011-08-10 DIAGNOSIS — E119 Type 2 diabetes mellitus without complications: Secondary | ICD-10-CM

## 2011-08-10 DIAGNOSIS — J209 Acute bronchitis, unspecified: Secondary | ICD-10-CM

## 2011-08-10 DIAGNOSIS — I10 Essential (primary) hypertension: Secondary | ICD-10-CM

## 2011-08-10 DIAGNOSIS — E785 Hyperlipidemia, unspecified: Secondary | ICD-10-CM

## 2011-08-10 DIAGNOSIS — Z008 Encounter for other general examination: Secondary | ICD-10-CM

## 2011-08-10 LAB — GLUCOSE, CAPILLARY: Glucose-Capillary: 190 mg/dL — ABNORMAL HIGH (ref 70–99)

## 2011-08-10 LAB — POCT GLYCOSYLATED HEMOGLOBIN (HGB A1C): Hemoglobin A1C: 7

## 2011-08-10 MED ORDER — ALBUTEROL SULFATE HFA 108 (90 BASE) MCG/ACT IN AERS: 2.0000 | INHALATION_SPRAY | RESPIRATORY_TRACT | Status: DC | PRN

## 2011-08-10 MED ORDER — METFORMIN HCL 1000 MG PO TABS: 1000.0000 mg | ORAL_TABLET | Freq: Two times a day (BID) | ORAL | Status: DC

## 2011-08-10 MED ORDER — LOSARTAN POTASSIUM-HCTZ 100-25 MG PO TABS: 1.0000 | ORAL_TABLET | Freq: Every day | ORAL | Status: DC

## 2011-08-10 MED ORDER — AZITHROMYCIN 250 MG PO TABS: ORAL_TABLET | ORAL | Status: AC

## 2011-08-10 MED ORDER — PRAVASTATIN SODIUM 40 MG PO TABS: 40.0000 mg | ORAL_TABLET | Freq: Every day | ORAL | Status: DC

## 2011-08-10 MED ORDER — PANTOPRAZOLE SODIUM 40 MG PO TBEC: 40.0000 mg | DELAYED_RELEASE_TABLET | Freq: Every day | ORAL | Status: DC

## 2011-08-10 MED ORDER — METOPROLOL SUCCINATE ER 25 MG PO TB24: 25.0000 mg | ORAL_TABLET | Freq: Every day | ORAL | Status: DC

## 2011-08-10 NOTE — Progress Notes (Signed)
Subjective:   Patient ID: Janice Massey female   DOB: October 20, 1955 56 y.o.   MRN: [id number]  HPI: [name] is a 56 y.o. female w h/o type 2 DM, asthma, hyperlipidemia, and hypertension presenting for diabetes f/u and with new complaint of productive cough. Pt reports that she has had 3 weeks of cough productive of green sputum. Three weeks ago, she noticed a runny nose, nasal congestion, and sore throat. These symptoms progressed into productive cough with chest pain that has persisted over the past 3 weeks. She has some chills but no fever. She endorses HA, but denies sinus pressure/tenderness/drainage. She has several sick grandchildren who she has come into contact with. She has a history of asthma, but has not noticed any wheezing with her cough. She has been using her albuterol inhaler more frequently, about once every other day, bc it helps with her cough. In terms of her type 2  diabetes, pt continues to take metformin 1000mg  BID and is not checking her blood sugars at home because she doesn't like doing it. She follows up regularly with ophtho and does not have any diabetic retinopathy. She is up to date on foot exams with no peripheral neuropathy. She is not having any sx of hypoglycemia (sweating, palpitations, dizziness). She has no other complaints today.     Past Medical History  Diagnosis Date  . Diabetes mellitus   . Hypertension   . Back pain   . Chronic PID   . Ventricular septal defect 2004 per echo   Current Outpatient Prescriptions  Medication Sig Dispense Refill  . albuterol (PROVENTIL HFA;VENTOLIN HFA) 108 (90 BASE) MCG/ACT inhaler Inhale 2 puffs into the lungs every 4 (four) hours as needed for wheezing.  1 Inhaler  0  . losartan-hydrochlorothiazide (HYZAAR) 100-25 MG per tablet Take 1 tablet by mouth daily.  30 tablet  6  . metFORMIN (GLUCOPHAGE) 1000 MG tablet Take 1 tablet (1,000 mg total) by mouth 2 (two) times daily.  60 tablet  11  . metoprolol succinate  (TOPROL-XL) 25 MG 24 hr tablet Take 1 tablet (25 mg total) by mouth daily.  30 tablet  6  . pantoprazole (PROTONIX) 40 MG tablet Take 1 tablet (40 mg total) by mouth daily.  30 tablet  6  . pravastatin (PRAVACHOL) 40 MG tablet Take 1 tablet (40 mg total) by mouth daily.  30 tablet  6  . DISCONTD: albuterol (PROAIR HFA) 108 (90 BASE) MCG/ACT inhaler Inhale 1 puff into the lungs 2 (two) times daily as needed.  1 Inhaler  11  . acetaminophen (TYLENOL) 325 MG tablet Take 2 tablets (650 mg total) by mouth every 4 (four) hours as needed for pain.  100 tablet  2  . azithromycin (ZITHROMAX) 250 MG tablet Take 2 tablets (500 mg) on  Day 1,  followed by 1 tablet (250 mg) once daily on Days 2 through 5.  6 each  0  . BD ULTRA-FINE LANCETS lancets To test blood glucose daily       . Blood Glucose Monitoring Suppl (FREESTYLE LITE) DEVI Check blood glucose once daily before breakfast or as directed       . glucose blood (FREESTYLE LITE) test strip To test blood sugar one time daily        No family history on file. History   Social History  . Marital Status: Single    Spouse Name: N/A    Number of Children: 1  . Years of Education: N/A  Occupational History  .     Social History Main Topics  . Smoking status: Never Smoker   . Smokeless tobacco: None  . Alcohol Use: No  . Drug Use: No  . Sexually Active: None   Other Topics Concern  . None   Social History Narrative  . None   Review of Systems: Constitutional: + fever/chills as per HPI Denies diaphoresis, appetite change and fatigue.  HEENT: per HPI  Respiratory: per HPI Cardiovascular: Denies chest pain, palpitations and leg swelling.  Gastrointestinal: Denies nausea, vomiting, abdominal pain, diarrhea, constipation, blood in stool Genitourinary: Denies dysuria, urgency, frequency, hematuria, flank pain and difficulty urinating.   Neurological: Denies dizziness, seizures, syncope, weakness, light-headedness, numbness    Objective:    Physical Exam: Filed Vitals:   08/10/11 1445  BP: 140/86  Pulse: 72  Temp: 96.8 F (36 C)  TempSrc: Oral  Height: 5\' 6"  (1.676 m)  Weight: 189 lb 12.8 oz (86.093 kg)  SpO2: 98%   Constitutional: Vital signs reviewed.  Patient is a well-developed and well-nourished AAF in no acute distress and cooperative with exam. She is deaf and utilizes an interpreter.  Alert and oriented x3.  Head: Normocephalic and atraumatic Ear: TM normal bilaterally Mouth: no erythema or exudates, MMM Eyes: PERRL, EOMI, conjunctivae normal, No scleral icterus.  Neck: Supple, Trachea midline normal ROM, No JVD, mass, thyromegaly, or carotid bruit present.  Cardiovascular: RRR, S1 normal, S2 normal, no MRG, pulses symmetric and intact bilaterally Pulmonary/Chest: CTAB, no wheezes, rales, or rhonchi Abdominal: Soft. Non-tender, non-distended, bowel sounds are normal, no masses, organomegaly, or guarding present.  Neurological: A&O x3, Strength is normal and symmetric bilaterally, cranial nerve II-XII are grossly intact, no focal motor deficit, sensory intact to light touch bilaterally.  Skin: Warm, dry and intact. No rash, cyanosis, or clubbing.     Assessment & Plan:

## 2011-08-10 NOTE — Assessment & Plan Note (Signed)
Lab Results  Component Value Date   NA 142 11/09/2010   K 3.6 11/09/2010   CL 103 11/09/2010   CO2 29 11/09/2010   BUN 14 11/09/2010   CREATININE 0.83 11/09/2010   CREATININE 0.71 01/01/2010    BP Readings from Last 3 Encounters:  08/10/11 140/86  04/07/11 148/88  02/25/11 147/98    Assessment: Hypertension control:  mildly elevated  Progress toward goals:  unchanged Barriers to meeting goals:  no barriers identified  Plan: Hypertension treatment:  continue current medications Pt on Losartan/HCTZ, followed by cardiology. Has f/u appt scheduled for October.

## 2011-08-10 NOTE — Assessment & Plan Note (Signed)
Pt's lung exam without wheezes or rhonchi, but has had persistent productive, painful cough for 3 weeks now. Suspect acute bacterial bronchitis precipitated by previous viral URI. As pt w h/o asthma, will go ahead and treat.  - Z-pak (confirmed on $3 list at pt's pharmacy).

## 2011-08-10 NOTE — Assessment & Plan Note (Signed)
Pt's cholesterol under good control with pravastatin per last lipid panel in March 2012. Says she believes she has had lipid panel repeated by cardiologist in March of 2013. Have requested records. If no labs drawn, will need repeat check. - Continue pravastatin

## 2011-08-10 NOTE — Assessment & Plan Note (Signed)
Lab Results  Component Value Date   HGBA1C 7.0 08/10/2011   HGBA1C 7.3 08/06/2009   CREATININE 0.83 11/09/2010   CREATININE 0.71 01/01/2010   MICROALBUR 25.95* 11/09/2010   MICRALBCREAT 131.6* 11/09/2010   CHOL 137 03/31/2010   HDL 54 03/31/2010   TRIG 89 03/31/2010    Last eye exam and foot exam:    Component Value Date/Time   HMDIABEYEEXA no diabetic retinopathy 09/2010   HMDIABFOOTEX done     Assessment: Diabetes control: controlled Progress toward goals: improved Barriers to meeting goals: Pt will not check blood glucose at home.   Plan: Diabetes treatment: continue current medications Refer to: none Instruction/counseling given: reminded to get eye exam, reminded to bring blood glucose meter & log to each visit, reminded to bring medications to each visit and other instruction/counseling:   Pt microalbumin:Cr is steadily increasing. She is currently on ARB (prescribed by cardiologist). Will recheck urine microalbumin at next visit. She is on adequate therapy for prevention of diabetic nephropathy. Will follow.

## 2011-08-10 NOTE — Patient Instructions (Signed)
1. Please try to check your blood sugars at home, write them down, and bring them to your visits. 2. Please continue taking all of your current medications. 3. Please come back to the clinic in 3 months for a follow-up appointment, labs and PAP smear.

## 2011-08-11 NOTE — Progress Notes (Signed)
INTERNAL MEDICINE TEACHING ATTENDING ADDENDUM - Inez Catalina, MD: I personally saw and evaluated Ms. Palla in this clinic visit in conjunction with the resident, Dr. Heloise Beecham. I have discussed patient's plan of care with medical resident during this visit. I have confirmed the physical exam findings and have read and agree with the clinic note including the plan with the following addition:  For her Diabetes - Dr. Heloise Beecham and I advised her to check her BS at home, which she has not been doing recently.  She has an A1C of 7.0 which is improved and close to goal for this patient, however, she seems to have a steadily increased MAU/Cr ratio.  She is on an ARB which was prescribed by her Cardiologist.  Will continue to follow closely (MAU/Cr at next visit).

## 2011-10-08 ENCOUNTER — Encounter: Payer: Self-pay | Admitting: Family Medicine

## 2011-10-28 ENCOUNTER — Encounter: Payer: Self-pay | Admitting: Family Medicine

## 2011-11-02 ENCOUNTER — Encounter: Payer: Self-pay | Admitting: Family Medicine

## 2011-11-09 ENCOUNTER — Encounter: Payer: Self-pay | Admitting: Internal Medicine

## 2011-11-09 ENCOUNTER — Ambulatory Visit (INDEPENDENT_AMBULATORY_CARE_PROVIDER_SITE_OTHER): Payer: Medicaid Other | Admitting: Internal Medicine

## 2011-11-09 ENCOUNTER — Other Ambulatory Visit (HOSPITAL_COMMUNITY)
Admission: RE | Admit: 2011-11-09 | Discharge: 2011-11-09 | Disposition: A | Discharge status: Home / Self Care | Payer: Medicaid Other | Source: Ambulatory Visit | Attending: Internal Medicine | Admitting: Internal Medicine

## 2011-11-09 VITALS — BP 160/91 | HR 75 | Temp 97.8°F | Ht 67.0 in | Wt 188.5 lb

## 2011-11-09 DIAGNOSIS — Z1211 Encounter for screening for malignant neoplasm of colon: Secondary | ICD-10-CM | POA: Insufficient documentation

## 2011-11-09 DIAGNOSIS — Z79899 Other long term (current) drug therapy: Secondary | ICD-10-CM

## 2011-11-09 DIAGNOSIS — E785 Hyperlipidemia, unspecified: Secondary | ICD-10-CM

## 2011-11-09 DIAGNOSIS — Z01419 Encounter for gynecological examination (general) (routine) without abnormal findings: Secondary | ICD-10-CM | POA: Insufficient documentation

## 2011-11-09 DIAGNOSIS — Z23 Encounter for immunization: Secondary | ICD-10-CM

## 2011-11-09 DIAGNOSIS — Z0000 Encounter for general adult medical examination without abnormal findings: Secondary | ICD-10-CM

## 2011-11-09 DIAGNOSIS — I10 Essential (primary) hypertension: Secondary | ICD-10-CM

## 2011-11-09 DIAGNOSIS — R011 Cardiac murmur, unspecified: Secondary | ICD-10-CM

## 2011-11-09 DIAGNOSIS — E119 Type 2 diabetes mellitus without complications: Secondary | ICD-10-CM

## 2011-11-09 LAB — GLUCOSE, CAPILLARY: Glucose-Capillary: 119 mg/dL — ABNORMAL HIGH (ref 70–99)

## 2011-11-09 LAB — POCT GLYCOSYLATED HEMOGLOBIN (HGB A1C): Hemoglobin A1C: 6.7

## 2011-11-09 MED ORDER — OLMESARTAN-AMLODIPINE-HCTZ 40-5-25 MG PO TABS: 1.0000 | ORAL_TABLET | Freq: Every day | ORAL | Status: DC

## 2011-11-09 NOTE — Assessment & Plan Note (Signed)
Pt says cardiology checked lipid panel in April, good control w pravastatin. We have requested records and got office visit note, but have not received lab results. Will attempt again. Last lipid panel 03/12 w LDL 65 and HDL 54. - Recommend continued pravastatin

## 2011-11-09 NOTE — Assessment & Plan Note (Signed)
Pt w VSD and chronic holosystolic murmur, followed by [facility] No evidence of heart failure on examination today. Per their last note, they want her to f/u now for repeat echo and clinic visit. She has yet to make f/u appt.  - encouraged f/u w [facility]

## 2011-11-09 NOTE — Assessment & Plan Note (Addendum)
Lab Results  Component Value Date   NA 142 11/09/2010   K 3.6 11/09/2010   CL 103 11/09/2010   CO2 29 11/09/2010   BUN 14 11/09/2010   CREATININE 0.83 11/09/2010   CREATININE 0.71 01/01/2010    BP Readings from Last 3 Encounters:  11/09/11 160/91  08/10/11 140/86  04/07/11 148/88    Assessment: Hypertension control:  moderately elevated  Progress toward goals:  deteriorated Barriers to meeting goals:  financial need  Plan: Hypertension treatment:  Will change patient from Hyzaar to Tribenzar (olmesartan-amlodipine-HCTZ) Amlodipine is new addition for her, as she has been on ARB-HCTZ combo in past. We we dose 40-5-25 today, and increase to 40-10-25 if she still has need. Recommended RTC 2 weeks for BP check, and follow-up with me in 3 months for routine visit. She should be able to afford new med with Medicaid. Told her if she has issues with affording medications, she should call us.  ADDENDUM Coverage of Tribenzar denied until pt has failed therapy w Exforge HCT. Sent in Rx of Exforge 5-320(amlodipine/valsartan) with 25mg  HCTZ. If she tolerates amlodipine, would change her to Exforge-HCT combo pill 10-320-25 at her next visit in 2weeks. There is no 5-320-25 strength available. Janice Massey to call and notify pts interpreter of change.

## 2011-11-09 NOTE — Assessment & Plan Note (Signed)
Pap smear performed today. 

## 2011-11-09 NOTE — Assessment & Plan Note (Signed)
Lab Results  Component Value Date   HGBA1C 6.7 11/09/2011   HGBA1C 7.3 08/06/2009   CREATININE 0.83 11/09/2010   CREATININE 0.71 01/01/2010   MICROALBUR 25.95* 11/09/2010   MICRALBCREAT 131.6* 11/09/2010   CHOL 137 03/31/2010   HDL 54 03/31/2010   TRIG 89 03/31/2010    Last eye exam and foot exam:    Component Value Date/Time   HMDIABEYEEXA no diabetic retinopathy 08/21/2009   HMDIABFOOTEX done 10/07/2009    Assessment: Diabetes control: controlled Progress toward goals: improved Barriers to meeting goals: no barriers identified  Plan: Diabetes treatment: continue current medications Refer to: none Instruction/counseling given: reminded to get eye exam Will not recheck Microalbumin:Cr as pt already on ARB therapy for renal protection. Continue metformin 1000 BID. Congratulated pt on improvement. Made referral for ophtho for annual retinopathy exam.

## 2011-11-09 NOTE — Patient Instructions (Signed)
1. Please stop taking Hyzaar and start taking a new blood pressure medicine, tribenzar (olmesartan-amlodipine-HCTZ) Your insurance should cover this medication with minimum copay, if you cannot afford, please call us.  2. Come back to our clinic in 2 weeks for blood pressure check. Come back in 3 months to see me.  3. Please call your cardiologist to set up follow-up appointment. 4. Continue taking all your other medications. 5. You will be called by our clinic to set up an appointment with a new ophthalmologist.

## 2011-11-09 NOTE — Progress Notes (Signed)
Patient ID: Janice Massey, female   DOB: 12/01/1955, 56 y.o.   MRN: [id number]  Subjective:   Patient ID: Janice Massey female   DOB: 1955/12/11 56 y.o.   MRN: [id number]  HPI: Janice Massey is a 56 y.o. female w pmh of T2DM, HTN, hyperlipidemia, bilateral deafness ventricular septal defect w chronic systolic heart murmur presenting for follow-up of DM and HTN as well as Pap Smear.  The patient feels well today. She has no complaints. In terms of her diabetes, she reports taking metformin 1000 BID as prescribed. Denies sx of hypoglycemia (dizziness, palpitations, sweating). Does not check BG at home. Denies visual changes, numbness/tingling. No diabetic retinopathy on prior exams. Due for ophtho exam, wants new referral today.  In terms of her HTN, she is concerned that it is not responding to medications. She is maxed out on hyzaar and she takes metoprolol 25mg  daily. She denies HA, CP, SOB, visual changes, LE edema. She does not check BP at home.  [facility] her chronic VSD. Per their last visit note 04/2011, she is due to see them again and get a routine echo. She has not made appt to follow-up yet. Per their note, lipid panel checked at time of visit, results not available today in [facility]. Pt is on pravastatin for hyperlipidemia, which has been titrated by her cardiologist in the past. She wants a pap smear today. Last pap smear 3 years ago was negative.    Past Medical History  Diagnosis Date  . Diabetes mellitus   . Hypertension   . Back pain   . Chronic PID   . Ventricular septal defect 2004 per echo   Current Outpatient Prescriptions  Medication Sig Dispense Refill  . acetaminophen (TYLENOL) 325 MG tablet Take 2 tablets (650 mg total) by mouth every 4 (four) hours as needed for pain.  100 tablet  2  . albuterol (PROVENTIL HFA;VENTOLIN HFA) 108 (90 BASE) MCG/ACT inhaler Inhale 2 puffs into the lungs every 4 (four) hours as needed for wheezing.  1 Inhaler  0  .  BD ULTRA-FINE LANCETS lancets To test blood glucose daily       . Blood Glucose Monitoring Suppl (FREESTYLE LITE) DEVI Check blood glucose once daily before breakfast or as directed       . glucose blood (FREESTYLE LITE) test strip To test blood sugar one time daily       . losartan-hydrochlorothiazide (HYZAAR) 100-25 MG per tablet Take 1 tablet by mouth daily.  30 tablet  6  . metFORMIN (GLUCOPHAGE) 1000 MG tablet Take 1 tablet (1,000 mg total) by mouth 2 (two) times daily.  60 tablet  11  . metoprolol succinate (TOPROL-XL) 25 MG 24 hr tablet Take 1 tablet (25 mg total) by mouth daily.  30 tablet  6  . pantoprazole (PROTONIX) 40 MG tablet Take 1 tablet (40 mg total) by mouth daily.  30 tablet  6  . pravastatin (PRAVACHOL) 40 MG tablet Take 1 tablet (40 mg total) by mouth daily.  30 tablet  6   No family history on file. History   Social History  . Marital Status: Single    Spouse Name: N/A    Number of Children: 1  . Years of Education: N/A   Occupational History  .     Social History Main Topics  . Smoking status: Never Smoker   . Smokeless tobacco: None  . Alcohol Use: No  . Drug  Use: No  . Sexually Active: None   Other Topics Concern  . None   Social History Narrative  . None   Review of Systems: Constitutional: Denies fever, chills, diaphoresis, or fatigue.  HEENT: Denies HA, visual change, LAD Respiratory: Denies SOB, DOE, cough, chest tightness. Last used inhaler "last year" Cardiovascular: Denies chest pain, palpitations and leg swelling.  Gastrointestinal: Denies N/V, abd pain, stool change in color or frequency Genitourinary: Denies dysuria, urgency, frequency,  Musculoskeletal: Denies joint swelling or myalgias Skin: Denies rash Neurological: Denies dizziness, weakness, light-headedness, numbness   Hematological: Denies adenopathy. Psychiatric/Behavioral: Denies mood change  Objective:  Physical Exam: Filed Vitals:   11/09/11 1329  BP: 160/91  Pulse: 75    Temp: 97.8 F (36.6 C)  TempSrc: Oral  Height: 5\' 7"  (1.702 m)  Weight: 188 lb 8 oz (85.503 kg)  SpO2: 98%   Constitutional: Vital signs reviewed.  Patient is a well-developed and well-nourished female in no acute distress and cooperative with exam. Alert and oriented x3.  Head: Normocephalic and atraumatic Mouth: no erythema or exudates, MMM Eyes: PERRL, EOMI, conjunctivae normal, No scleral icterus.  Neck: Supple, Trachea midline normal ROM, No JVD, mass, or thyromegaly Cardiovascular: RRR, S1 normal, S2 normal, 3/6 holosystolic murmur, loudest at LSB Pulmonary/Chest: CTAB, no wheezes, rales, or rhonchi Abdominal: Soft. Non-tender, non-distended, bowel sounds are normal, no masses  Musculoskeletal: No joint deformities, erythema, or stiffness, ROM full and no nontender Hematology: no cervical or supraclavicular LAD Neurological: A&O x3, Strength is normal and symmetric bilaterally, cranial nerve II-XII are grossly intact, no focal motor deficit, sensory intact to light touch bilaterally.  Skin: Warm, dry and intact. No rash, cyanosis, or clubbing.  Psychiatric: Normal mood and affect. speech and behavior is normal. Judgment and thought content normal. Cognition and memory are normal.  Genitourinary: Cervix is pink and non-erythematous. No bleeding or discharge emanating from os. No deformity or tenderness of adnexa noted on bimanual exam.  Assessment & Plan:

## 2011-11-10 ENCOUNTER — Telehealth: Payer: Self-pay | Admitting: *Deleted

## 2011-11-10 ENCOUNTER — Other Ambulatory Visit: Payer: Self-pay | Admitting: Internal Medicine

## 2011-11-10 LAB — BASIC METABOLIC PANEL WITH GFR
Chloride: 104 mEq/L (ref 96–112)
GFR, Est African American: >89 mL/min
GFR, Est Non African American: 89 mL/min
Potassium: 3.6 mEq/L (ref 3.5–5.3)
Sodium: 145 mEq/L (ref 135–145)

## 2011-11-10 MED ORDER — HYDROCHLOROTHIAZIDE 25 MG PO TABS: 25.0000 mg | ORAL_TABLET | Freq: Every day | ORAL | Status: DC

## 2011-11-10 MED ORDER — AMLODIPINE BESYLATE-VALSARTAN 5-320 MG PO TABS: 1.0000 | ORAL_TABLET | Freq: Every day | ORAL | Status: DC

## 2011-11-10 NOTE — Telephone Encounter (Signed)
Call to [facility] for Prior Authorization of Tribenzor.   Denied pt will need to try Exforge or Exforge HCT which are the preferred drugs.  Message to be sent to Dr. Heloise Beecham.  Angelina Ok, RN 11/10/2011 1:29 PM.

## 2011-11-10 NOTE — Telephone Encounter (Signed)
Interpreter called for pt about not able to get Olimesartan-Amlodepine-HCTZ. Talked with [employer] - med needs prior authorization - will send paperwork to clinic. Called interpreter [phone] and aware of above - will explain to pt. Stanton Kidney Chrissy Ealey RN 11/10/11 11:30AM

## 2011-11-11 ENCOUNTER — Encounter: Payer: Self-pay | Admitting: Dietician

## 2011-11-11 NOTE — Progress Notes (Signed)
I saw, examined, and discussed the patient with Dr Heloise Beecham and agree with the note contained here.

## 2011-11-23 ENCOUNTER — Encounter: Payer: Self-pay | Admitting: Internal Medicine

## 2011-11-23 ENCOUNTER — Ambulatory Visit (INDEPENDENT_AMBULATORY_CARE_PROVIDER_SITE_OTHER): Payer: Medicaid Other | Admitting: Internal Medicine

## 2011-11-23 VITALS — BP 138/81 | HR 76 | Temp 97.9°F | Ht 67.0 in | Wt 186.6 lb

## 2011-11-23 DIAGNOSIS — E119 Type 2 diabetes mellitus without complications: Secondary | ICD-10-CM

## 2011-11-23 DIAGNOSIS — E785 Hyperlipidemia, unspecified: Secondary | ICD-10-CM

## 2011-11-23 DIAGNOSIS — K219 Gastro-esophageal reflux disease without esophagitis: Secondary | ICD-10-CM

## 2011-11-23 DIAGNOSIS — Z0000 Encounter for general adult medical examination without abnormal findings: Secondary | ICD-10-CM

## 2011-11-23 DIAGNOSIS — I10 Essential (primary) hypertension: Secondary | ICD-10-CM

## 2011-11-23 LAB — GLUCOSE, CAPILLARY: Glucose-Capillary: 111 mg/dL — ABNORMAL HIGH (ref 70–99)

## 2011-11-23 LAB — LIPID PANEL
HDL: 61 mg/dL (ref >39)
Triglycerides: 64 mg/dL (ref <150)

## 2011-11-23 MED ORDER — PRAVASTATIN SODIUM 40 MG PO TABS: 40.0000 mg | ORAL_TABLET | Freq: Every day | ORAL | Status: DC

## 2011-11-23 MED ORDER — METOPROLOL SUCCINATE ER 25 MG PO TB24: 25.0000 mg | ORAL_TABLET | Freq: Every day | ORAL | Status: DC

## 2011-11-23 MED ORDER — METFORMIN HCL 1000 MG PO TABS: 1000.0000 mg | ORAL_TABLET | Freq: Two times a day (BID) | ORAL | Status: DC

## 2011-11-23 MED ORDER — PANTOPRAZOLE SODIUM 40 MG PO TBEC: 40.0000 mg | DELAYED_RELEASE_TABLET | Freq: Every day | ORAL | Status: DC

## 2011-11-23 NOTE — Progress Notes (Signed)
I discussed patient with resident Dr. McLean, and I agree with the plans as outlined in her note. 

## 2011-11-23 NOTE — Assessment & Plan Note (Signed)
Repeat lipid panel today Cont. Pravachol 40 mg qhs

## 2011-11-23 NOTE — Assessment & Plan Note (Signed)
Up to date of vaccinations, reviewed pap results and bloodwork Pending lipid panel today

## 2011-11-23 NOTE — Progress Notes (Signed)
  Subjective:    Patient ID: Janice Massey, female    DOB: 04-04-1955, 56 y.o.   MRN: [id number]  HPI Comments: 56 y.o PMH HTN (BP 138/81), DM 2 (HA1C 6.7% 11/5 fsbs 111),  HLD, asthma, deafness (with interpreter), GERD, chronic back pain, systolic murmur.   She presents for 2 week follow up for blood pressure, to get medication refills, and results of pap smear and blood.  SH: 2 kids, From [place], Never smoker. She uses [facility] on [address] and will probably use [facility] to get some medications      Review of Systems  Respiratory: Negative for shortness of breath.   Cardiovascular: Negative for chest pain and leg swelling.  Gastrointestinal: Negative for abdominal pain.  Musculoskeletal: Negative for gait problem.       Right knee swollen 11/18 w/o trauma now resolved.   Skin: Negative for rash.       Objective:   Physical Exam  Nursing note and vitals reviewed. Constitutional: She is oriented to person, place, and time. Vital signs are normal. She appears well-developed and well-nourished. She is cooperative. No distress.       Pleasant   HENT:  Head: Normocephalic and atraumatic.  Mouth/Throat: Oropharynx is clear and moist and mucous membranes are normal. No oropharyngeal exudate.  Eyes: Conjunctivae normal are normal. Pupils are equal, round, and reactive to light. Right eye exhibits no discharge. Left eye exhibits no discharge. No scleral icterus.  Cardiovascular: Normal rate, regular rhythm, S1 normal, S2 normal and normal heart sounds.   No murmur heard. Pulmonary/Chest: Effort normal and breath sounds normal. No respiratory distress. She has no wheezes.  Abdominal: Soft. Bowel sounds are normal. She exhibits no distension. There is no tenderness.       Obese ab  Musculoskeletal: She exhibits no edema.       No right knee edema   Neurological: She is alert and oriented to person, place, and time. Gait normal.  Skin: Skin is warm, dry and intact. No  rash noted. She is not diaphoretic.  Psychiatric: She has a normal mood and affect. Her behavior is normal. Judgment and thought content normal.       Nonverbal due to deafness           Assessment & Plan:  F/u 3-6 months HTN and DM

## 2011-11-23 NOTE — Assessment & Plan Note (Addendum)
HA1C 6.7 %, fsbs 111 Cont. Metformin 1000 mg bid  Repeat HA1C in 3 months  Saw eye MD 11/18 with negative exam per patient

## 2011-11-23 NOTE — Assessment & Plan Note (Signed)
BP 138/81 today  Cont. Exforge 5-320 will not increased dose today to 10-320 mg, cont. HCTZ 25 mg qd, Toprol XL 25 mg daily  F/u 3-6 months

## 2011-11-23 NOTE — Assessment & Plan Note (Signed)
Continue Protonix 40 mg qd. 

## 2011-11-23 NOTE — Patient Instructions (Signed)
Follow up in 3-6 months   Cholesterol Cholesterol is a type of fat. Your body needs a small amount of cholesterol, but too much can cause health problems. Certain problems include heart attacks, strokes, and not enough blood flow to your heart, brain, kidneys, or feet. You get cholesterol in 2 ways:  Naturally.  By eating certain foods. HOME CARE  Eat a low-fat diet:  Eat less eggs, whole dairy products (whole milk, cheese, and butter), fatty meats, and fried foods.  Eat more fruits, vegetables, whole-wheat breads, lean chicken, and fish.  Follow your exercise program as told by your doctor.  Keep your weight at a healthy level. Talk to your doctor about what is right for you.  Only take medicine as told by your doctor.  Get your cholesterol checked once a year or as told by your doctor. MAKE SURE YOU:  Understand these instructions.  Will watch your condition.  Will get help right away if you are not doing well or get worse. Document Released: 03/19/2008 Document Revised: 03/15/2011 Document Reviewed: 03/19/2008 [facility] Patient Information 2013 [place], [place]. Gastroesophageal Reflux Disease, Adult Gastroesophageal reflux disease (GERD) happens when acid from your stomach flows up into the esophagus. When acid comes in contact with the esophagus, the acid causes soreness (inflammation) in the esophagus. Over time, GERD may create small holes (ulcers) in the lining of the esophagus. CAUSES   Increased body weight. This puts pressure on the stomach, making acid rise from the stomach into the esophagus.  Smoking. This increases acid production in the stomach.  Drinking alcohol. This causes decreased pressure in the lower esophageal sphincter (valve or ring of muscle between the esophagus and stomach), allowing acid from the stomach into the esophagus.  Late evening meals and a full stomach. This increases pressure and acid production in the stomach.  A malformed lower  esophageal sphincter. Sometimes, no cause is found. SYMPTOMS   Burning pain in the lower part of the mid-chest behind the breastbone and in the mid-stomach area. This may occur twice a week or more often.  Trouble swallowing.  Sore throat.  Dry cough.  Asthma-like symptoms including chest tightness, shortness of breath, or wheezing. DIAGNOSIS  Your caregiver may be able to diagnose GERD based on your symptoms. In some cases, X-rays and other tests may be done to check for complications or to check the condition of your stomach and esophagus. TREATMENT  Your caregiver may recommend over-the-counter or prescription medicines to help decrease acid production. Ask your caregiver before starting or adding any new medicines.  HOME CARE INSTRUCTIONS   Change the factors that you can control. Ask your caregiver for guidance concerning weight loss, quitting smoking, and alcohol consumption.  Avoid foods and drinks that make your symptoms worse, such as:  Caffeine or alcoholic drinks.  Chocolate.  Peppermint or mint flavorings.  Garlic and onions.  Spicy foods.  Citrus fruits, such as oranges, lemons, or limes.  Tomato-based foods such as sauce, chili, salsa, and pizza.  Fried and fatty foods.  Avoid lying down for the 3 hours prior to your bedtime or prior to taking a nap.  Eat small, frequent meals instead of large meals.  Wear loose-fitting clothing. Do not wear anything tight around your waist that causes pressure on your stomach.  Raise the head of your bed 6 to 8 inches with wood blocks to help you sleep. Extra pillows will not help.  Only take over-the-counter or prescription medicines for pain, discomfort, or fever as  directed by your caregiver.  Do not take aspirin, ibuprofen, or other nonsteroidal anti-inflammatory drugs (NSAIDs). SEEK IMMEDIATE MEDICAL CARE IF:   You have pain in your arms, neck, jaw, teeth, or back.  Your pain increases or changes in intensity  or duration.  You develop nausea, vomiting, or sweating (diaphoresis).  You develop shortness of breath, or you faint.  Your vomit is green, yellow, black, or looks like coffee grounds or blood.  Your stool is red, bloody, or black. These symptoms could be signs of other problems, such as heart disease, gastric bleeding, or esophageal bleeding. MAKE SURE YOU:   Understand these instructions.  Will watch your condition.  Will get help right away if you are not doing well or get worse. Document Released: 09/30/2004 Document Revised: 03/15/2011 Document Reviewed: 07/10/2010 [facility] Patient Information 2013 [place], [place].  Hypertension Hypertension is another name for high blood pressure. High blood pressure may mean that your heart needs to work harder to pump blood. Blood pressure consists of two numbers, which includes a higher number over a lower number (example: 110/72). HOME CARE   Make lifestyle changes as told by your doctor. This may include weight loss and exercise.  Take your blood pressure medicine every day.  Limit how much salt you use.  Stop smoking if you smoke.  Do not use drugs.  Talk to your doctor if you are using decongestants or birth control pills. These medicines might make blood pressure higher.  Females should not drink more than 1 alcoholic drink per day. Males should not drink more than 2 alcoholic drinks per day.  See your doctor as told. GET HELP RIGHT AWAY IF:   You have a blood pressure reading with a top number of 180 or higher.  You get a very bad headache.  You get blurred or changing vision.  You feel confused.  You feel weak, numb, or faint.  You get chest or belly (abdominal) pain.  You throw up (vomit).  You cannot breathe very well. MAKE SURE YOU:   Understand these instructions.  Will watch your condition.  Will get help right away if you are not doing well or get worse. Document Released: 06/09/2007 Document  Revised: 03/15/2011 Document Reviewed: 06/09/2007 [facility] Patient Information 2013 [place], [place]. Type 2 Diabetes Diabetes is a long-lasting (chronic) disease. One or both of the following happen with type 2 diabetes:   The pancreas does not make enough of a hormone called insulin.  The body has trouble using the insulin that is made. HOME CARE  Check your blood sugar (glucose) once a day, or as told by your doctor.  Take all medicine as told by your doctor.  Do not smoke.  Eat healthy foods. Weight loss can help your diabetes.  Learn about low blood sugar (hypoglycemia). Know how to treat it.  Get your eyes checked on a regular basis.  Get a physical exam every year. Get your blood pressure checked. Get your blood and pee (urine) tested.  Wear a necklace or bracelet that says you have diabetes.  Check your feet every night for cuts, sores, blisters, and redness. Tell your doctor if you have problems. GET HELP RIGHT AWAY IF:  You have trouble keeping your blood sugar in target range.  You have problems with your medicines.  You are sick and not getting better after 24 hours.  You have a sore or wound that is not healing.  You have vision problems or changes.  You have a  fever. MAKE SURE YOU:  Understand these instructions.  Will watch your condition.  Will get help right away if you are not doing well or get worse. Document Released: 09/30/2007 Document Revised: 03/15/2011 Document Reviewed: 06/08/2010 [facility] Patient Information 2013 [place], [place].

## 2011-11-24 ENCOUNTER — Encounter: Payer: Self-pay | Admitting: Internal Medicine

## 2012-01-06 ENCOUNTER — Other Ambulatory Visit: Payer: Self-pay

## 2012-01-06 ENCOUNTER — Emergency Department (INDEPENDENT_AMBULATORY_CARE_PROVIDER_SITE_OTHER)
Admission: EM | Admit: 2012-01-06 | Discharge: 2012-01-06 | Disposition: A | Discharge status: Home / Self Care | Payer: No Typology Code available for payment source | Source: Home / Self Care | Attending: Family Medicine | Admitting: Family Medicine

## 2012-01-06 ENCOUNTER — Encounter (HOSPITAL_COMMUNITY): Payer: Self-pay | Admitting: Emergency Medicine

## 2012-01-06 DIAGNOSIS — R071 Chest pain on breathing: Secondary | ICD-10-CM

## 2012-01-06 DIAGNOSIS — R0789 Other chest pain: Secondary | ICD-10-CM

## 2012-01-06 MED ORDER — DICLOFENAC POTASSIUM 50 MG PO TABS: 50.0000 mg | ORAL_TABLET | Freq: Three times a day (TID) | ORAL | Status: DC

## 2012-01-06 NOTE — ED Provider Notes (Signed)
History     CSN: [id number]  Arrival date & time 01/06/12  1535   First MD Initiated Contact with Patient 01/06/12 1725      Chief Complaint  Patient presents with  . Chest Pain    pain under left breast. sharp. comes and goes. pain in left hand.     (Consider location/radiation/quality/duration/timing/severity/associated sxs/prior treatment) Patient is a 57 y.o. female presenting with chest pain. The history is provided by the patient.  Chest Pain The chest pain began 6 - 12 hours ago. Chest pain occurs intermittently. The chest pain is improving. The severity of the pain is mild. The quality of the pain is described as sharp. The pain does not radiate. Pertinent negatives for primary symptoms include no palpitations.     History reviewed. No pertinent past medical history.  Past Surgical History  Procedure Date  . Abdominal hysterectomy   . Cholecystectomy   . Abdominal hernia repair     History reviewed. No pertinent family history.  History  Substance Use Topics  . Smoking status: Never Smoker   . Smokeless tobacco: Not on file  . Alcohol Use: Yes     Comment: sometimes    OB History    Grav Para Term Preterm Abortions TAB SAB Ect Mult Living                  Review of Systems  Constitutional: Negative.   HENT: Negative.   Respiratory: Negative.   Cardiovascular: Positive for chest pain. Negative for palpitations and leg swelling.  Gastrointestinal: Negative.     Allergies  Review of patient's allergies indicates no known allergies.  Home Medications   Current Outpatient Rx  Name  Route  Sig  Dispense  Refill  . ASPIRIN 81 MG PO TBEC   Oral   Take 1 tablet (81 mg total) by mouth daily. Swallow whole.   30 tablet   12   . ACETAMINOPHEN 500 MG PO TABS   Oral   Take 500 mg by mouth every 6 (six) hours as needed. For pain.         Marland Kitchen DICLOFENAC POTASSIUM 50 MG PO TABS   Oral   Take 1 tablet (50 mg total) by mouth 3 (three) times daily. As  needed for pain.   21 tablet   0     BP 113/63  Pulse 57  Temp 98.1 F (36.7 C) (Oral)  Resp 16  SpO2 100%  Physical Exam  Nursing note and vitals reviewed. Constitutional: She is oriented to person, place, and time. She appears well-developed and well-nourished. No distress.  HENT:  Head: Normocephalic.  Right Ear: External ear normal.  Left Ear: External ear normal.  Mouth/Throat: Oropharynx is clear and moist.  Eyes: Pupils are equal, round, and reactive to light.  Neck: Normal range of motion. Neck supple.  Cardiovascular: Normal rate, regular rhythm, normal heart sounds and intact distal pulses.   Pulmonary/Chest: Effort normal and breath sounds normal. She exhibits tenderness.  Abdominal: Soft. Bowel sounds are normal.  Musculoskeletal: She exhibits no edema.  Lymphadenopathy:    She has no cervical adenopathy.  Neurological: She is alert and oriented to person, place, and time.  Skin: Skin is warm and dry.    ED Course  Procedures (including critical care time)  Labs Reviewed - No data to display No results found.   1. Acute chest wall pain       MDM  [id number].  Linna Hoff, MD 01/06/12 [phone]

## 2012-01-06 NOTE — ED Notes (Addendum)
Pt c/o chest pain that started today at 4:30 a.m. Pain is sharp/comes and goes. Pain in left hand. Pt states that she took a bayer aspirin this a.m at 11:00. No relief in symptoms. Pt denies sob.Kizzie Furnish.

## 2012-01-06 NOTE — ED Notes (Signed)
Waiting discharge papers 

## 2012-02-02 ENCOUNTER — Ambulatory Visit: Payer: Medicaid Other | Admitting: Internal Medicine

## 2012-02-03 ENCOUNTER — Ambulatory Visit (INDEPENDENT_AMBULATORY_CARE_PROVIDER_SITE_OTHER): Payer: Medicaid Other | Admitting: Internal Medicine

## 2012-02-03 ENCOUNTER — Encounter: Payer: Self-pay | Admitting: Internal Medicine

## 2012-02-03 VITALS — BP 130/84 | HR 70 | Temp 97.9°F | Wt 190.4 lb

## 2012-02-03 DIAGNOSIS — Z91040 Latex allergy status: Secondary | ICD-10-CM

## 2012-02-03 DIAGNOSIS — E119 Type 2 diabetes mellitus without complications: Secondary | ICD-10-CM

## 2012-02-03 DIAGNOSIS — Z79899 Other long term (current) drug therapy: Secondary | ICD-10-CM

## 2012-02-03 DIAGNOSIS — I10 Essential (primary) hypertension: Secondary | ICD-10-CM

## 2012-02-03 MED ORDER — METOPROLOL SUCCINATE ER 25 MG PO TB24: 25.0000 mg | ORAL_TABLET | Freq: Every day | ORAL | Status: DC

## 2012-02-03 MED ORDER — METFORMIN HCL 1000 MG PO TABS: 1000.0000 mg | ORAL_TABLET | Freq: Two times a day (BID) | ORAL | Status: DC

## 2012-02-03 MED ORDER — AMLODIPINE BESYLATE-VALSARTAN 5-320 MG PO TABS: 1.0000 | ORAL_TABLET | Freq: Every day | ORAL | Status: DC

## 2012-02-03 MED ORDER — HYDROCHLOROTHIAZIDE 25 MG PO TABS: 25.0000 mg | ORAL_TABLET | Freq: Every day | ORAL | Status: DC

## 2012-02-03 NOTE — Progress Notes (Signed)
Metformin called into pharmacy.Kingsley Spittle Cassady1/30/201412:50 PM

## 2012-02-03 NOTE — Assessment & Plan Note (Signed)
Blood pressure is well-controlled today. Refills for blood pressure medications given. No changes in dosage.

## 2012-02-03 NOTE — Assessment & Plan Note (Signed)
Will check A1c today. Patient reports no problems with her medications.

## 2012-02-03 NOTE — Progress Notes (Signed)
Internal Medicine Clinic Visit    HPI:  Janice Massey is a 57 y.o. year old female history of deafness, diabetes, hypertension, who presents for evaluation of latex allergy.  Patient states that she is a previous scheduled for cataract surgery that they needed medical clearance given reports of latex allergy. Patient states that she is not aware that she has a latex allergy. She denies having anaphylactic symptoms to any medication she has had in the past. She has had numerous procedures and surgeries in the past and does not remember this allergy being noted. She does state that she gets short of breath with certain allergens in the air like smoke for example. She uses an albuterol inhaler when necessary for this. She has an allergy to penicillin but she is not sure of reaction this was a long time ago.  Patient denies having a rash, shortness of breath, chest pain, hives.    Past Medical History  Diagnosis Date  . Diabetes mellitus   . Hypertension   . Back pain   . Chronic PID   . Ventricular septal defect 2004 per echo    Past Surgical History  Procedure Date  . Cholecystectomy 11/04/1993  . Tubal ligation 10/05/1978  . Colposcopy      ROS:  A complete review of systems was otherwise negative, except as noted in the HPI.  Allergies: Codeine and Penicillins  Medications: Current Outpatient Prescriptions  Medication Sig Dispense Refill  . acetaminophen (TYLENOL) 325 MG tablet Take 2 tablets (650 mg total) by mouth every 4 (four) hours as needed for pain.  100 tablet  2  . albuterol (PROVENTIL HFA;VENTOLIN HFA) 108 (90 BASE) MCG/ACT inhaler Inhale 2 puffs into the lungs every 4 (four) hours as needed for wheezing.  1 Inhaler  0  . amLODipine-valsartan (EXFORGE) 5-320 MG per tablet Take 1 tablet by mouth daily.  30 tablet  5  . BD ULTRA-FINE LANCETS lancets To test blood glucose daily       . Blood Glucose Monitoring Suppl (FREESTYLE LITE) DEVI Check blood glucose once daily  before breakfast or as directed       . glucose blood (FREESTYLE LITE) test strip To test blood sugar one time daily       . hydrochlorothiazide (HYDRODIURIL) 25 MG tablet Take 1 tablet (25 mg total) by mouth daily.  30 tablet  5  . metFORMIN (GLUCOPHAGE) 1000 MG tablet Take 1 tablet (1,000 mg total) by mouth 2 (two) times daily.  60 tablet  11  . metoprolol succinate (TOPROL-XL) 25 MG 24 hr tablet Take 1 tablet (25 mg total) by mouth daily.  30 tablet  6  . pantoprazole (PROTONIX) 40 MG tablet Take 1 tablet (40 mg total) by mouth daily.  30 tablet  6  . pravastatin (PRAVACHOL) 40 MG tablet Take 1 tablet (40 mg total) by mouth daily.  30 tablet  6    History   Social History  . Marital Status: Single    Spouse Name: N/A    Number of Children: 1  . Years of Education: N/A   Occupational History  .     Social History Main Topics  . Smoking status: Never Smoker   . Smokeless tobacco: Not on file  . Alcohol Use: No  . Drug Use: No  . Sexually Active: Not on file   Other Topics Concern  . Not on file   Social History Narrative  . No narrative on file  family history is not on file.  Physical Exam Blood pressure 130/84, pulse 70, temperature 97.9 F (36.6 C), temperature source Oral, weight 190 lb 6.4 oz (86.365 kg), SpO2 97.00%. General:  No acute distress, alert and oriented x 3, well-appearing  HEENT:  PERRL, EOMI, no lymphadenopathy, moist mucous membranes Cardiovascular:  Regular rate and rhythm, no murmurs, rubs or gallops Respiratory:  Clear to auscultation bilaterally, no wheezes, rales, or rhonchi Abdomen:  Soft, nondistended, nontender, normoactive bowel sounds Extremities:  Warm and well-perfused, no clubbing, cyanosis, or edema.  Skin: Warm, dry, no rashes, no hives, no erythema Neuro: Not anxious appearing, no depressed mood, normal affect  Labs: Lab Results  Component Value Date   CREATININE 0.76 11/09/2011   BUN 15 11/09/2011   NA 145 11/09/2011   K 3.6  11/09/2011   CL 104 11/09/2011   CO2 30 11/09/2011   Lab Results  Component Value Date   WBC 11.1* 01/01/2010   HGB 12.9 01/01/2010   HCT 37.6 01/01/2010   MCV 86.4 01/01/2010   PLT 262 01/01/2010      Assessment and Plan:    FOLLOWUP: Janice Massey will follow back up in our clinic as scheduled. Janice Massey knows to call out clinic in the meantime with any questions or new issues.

## 2012-02-03 NOTE — Patient Instructions (Addendum)
Please return to clinic as scheduled

## 2012-02-03 NOTE — Assessment & Plan Note (Addendum)
Is unclear whether patient has a latex allergy. Her only allergies listed in our system are codeine and penicillin. Upon calling her gastroenterologist, it appears that she does have a latex glove allergy listed in their system. The reaction type was not noted.  We called a local allergist office and they recommended using a latex allergen blood test.  Otherwise, patient is cleared for cataract surgery. She has diabetes that is controlled with oral agents, no need for perioperative insulin. She has a low risk cardiac status, no heart failure, small VSD. EKG in 2012 was normal.  -Will draw blood to test for latex allergen

## 2012-02-04 ENCOUNTER — Ambulatory Visit (INDEPENDENT_AMBULATORY_CARE_PROVIDER_SITE_OTHER): Payer: No Typology Code available for payment source | Admitting: Family Medicine

## 2012-02-04 ENCOUNTER — Encounter: Payer: Self-pay | Admitting: Family Medicine

## 2012-02-04 VITALS — BP 125/73 | HR 81 | Temp 98.2°F | Ht 63.0 in | Wt 185.2 lb

## 2012-02-04 DIAGNOSIS — E669 Obesity, unspecified: Secondary | ICD-10-CM

## 2012-02-04 DIAGNOSIS — N390 Urinary tract infection, site not specified: Secondary | ICD-10-CM | POA: Insufficient documentation

## 2012-02-04 DIAGNOSIS — N760 Acute vaginitis: Secondary | ICD-10-CM

## 2012-02-04 LAB — POCT WET PREP (WET MOUNT)

## 2012-02-04 LAB — POCT URINALYSIS DIPSTICK
Glucose, UA: NEGATIVE
Nitrite, UA: NEGATIVE
Protein, UA: NEGATIVE
Urobilinogen, UA: 1

## 2012-02-04 LAB — POCT UA - MICROSCOPIC ONLY

## 2012-02-04 MED ORDER — CEPHALEXIN 500 MG PO CAPS: 500.0000 mg | ORAL_CAPSULE | Freq: Four times a day (QID) | ORAL | Status: DC

## 2012-02-04 MED ORDER — CEPHALEXIN 500 MG PO CAPS: 500.0000 mg | ORAL_CAPSULE | Freq: Two times a day (BID) | ORAL | Status: DC

## 2012-02-04 MED ORDER — FLUCONAZOLE 150 MG PO TABS: 150.0000 mg | ORAL_TABLET | Freq: Once | ORAL | Status: DC

## 2012-02-04 NOTE — Patient Instructions (Addendum)
You have a Urine infection and a yeast infection.   I have printed prescriptions to treat both.  Keflex is free at [facility].   Come back to see me if the symptoms don't resolve PLease come back for a follow up in 2 months.   Remember to try to walk 15 minutes a day 3 times a week and eat breakfast every day.

## 2012-02-04 NOTE — Assessment & Plan Note (Signed)
With suprapubic tenderness on exam and dysuria.  Mod LE on UA  Keflex 500 BID for 7 days.  RTC if no improvement in symptoms.

## 2012-02-04 NOTE — Assessment & Plan Note (Signed)
For last 1 week, yeast seen on wet prep [facility] as she has 1 partner, is not concerned about STDs, has no uterus to have PID in, and has limited funding  Diflucan 150mg  once today and repeat in 3 days RTC if no resolution.

## 2012-02-04 NOTE — Progress Notes (Signed)
  Subjective:    Patient ID: Brandi Mendez, female    DOB: 26-Jan-1955, 57 y.o.   MRN: [id number]  HPI Pt here for for follow up and vaginal discharge  She is ok with her weight and does not exercise regularly.  Doesn't watch her diet, eats lots of fried and fatty foods.  Vaginal discharge and about 1 week - thick and white  Some fishy odor, Sexually active with 1 partner, long time boyfriend in 6 months, uses condoms. No concern for STDs Some questionable burning when she urinates.   No fevers, chills, sweats  Review of Systems Per HPI    Objective:   Physical Exam  Gen: NAD, alert, cooperative with exam CV: RRR, good S1/S2, no murmur Resp: CTABL, no wheezes, non-labored Abd: SNTND. + BS Ext: No edema, warm GU: Thick white liquid present in vaginal vault. Suprapubic tenderness with palpation.  bimanual exam normal except for tenderness    Assessment & Plan:

## 2012-02-04 NOTE — Assessment & Plan Note (Signed)
BMI 32 and patient unaware and unmotivated Discussed adverse effects of obesity and increased health risks, she expressed understanding.   Encouraged to become cognisent of her eating habits and start thinking about healthy choices.  Se two goals: breakfast every day and walking 15 minutes 3 times a week.  F/u in 2 months

## 2012-02-10 ENCOUNTER — Ambulatory Visit: Payer: Medicaid Other | Admitting: Internal Medicine

## 2012-02-15 NOTE — Addendum Note (Signed)
Addended by: Maura Crandall on: 02/15/2012 01:48 PM   Modules accepted: Orders

## 2012-03-14 ENCOUNTER — Ambulatory Visit (INDEPENDENT_AMBULATORY_CARE_PROVIDER_SITE_OTHER): Payer: Medicaid Other | Admitting: Internal Medicine

## 2012-03-14 ENCOUNTER — Encounter: Payer: Self-pay | Admitting: Internal Medicine

## 2012-03-14 VITALS — BP 120/70 | HR 74 | Temp 98.0°F | Ht 67.0 in | Wt 192.2 lb

## 2012-03-14 DIAGNOSIS — M7552 Bursitis of left shoulder: Secondary | ICD-10-CM | POA: Insufficient documentation

## 2012-03-14 DIAGNOSIS — I10 Essential (primary) hypertension: Secondary | ICD-10-CM

## 2012-03-14 DIAGNOSIS — E119 Type 2 diabetes mellitus without complications: Secondary | ICD-10-CM

## 2012-03-14 DIAGNOSIS — E785 Hyperlipidemia, unspecified: Secondary | ICD-10-CM

## 2012-03-14 DIAGNOSIS — M67919 Unspecified disorder of synovium and tendon, unspecified shoulder: Secondary | ICD-10-CM

## 2012-03-14 DIAGNOSIS — N644 Mastodynia: Secondary | ICD-10-CM | POA: Insufficient documentation

## 2012-03-14 DIAGNOSIS — R011 Cardiac murmur, unspecified: Secondary | ICD-10-CM

## 2012-03-14 LAB — GLUCOSE, CAPILLARY: Glucose-Capillary: 134 mg/dL — ABNORMAL HIGH (ref 70–99)

## 2012-03-14 NOTE — Patient Instructions (Addendum)
1. Please continue with your breast examinations at home. Come back to see me in 2 weeks.  2. Go go your mammogram on the 18th.  3. Follow up with your heart doctor.  4. Keep taking all of your medicines.  You are doing a great job!

## 2012-03-14 NOTE — Assessment & Plan Note (Signed)
Pt w VSD, chronic holosystolic murmur, followed at [facility]. No evidence heart failure on exam. Patient was due to follow up with them this winter for repeat echo/clinic visit. but has yet to schedule follow up. - provided her and interpreter the # for clinic, she must call and arrange f/u

## 2012-03-14 NOTE — Assessment & Plan Note (Signed)
Lab Results  Component Value Date   HGBA1C 6.8 02/03/2012   HGBA1C 7.3 08/06/2009   CREATININE 0.76 11/09/2011   CREATININE 0.71 01/01/2010   MICROALBUR 25.95* 11/09/2010   MICRALBCREAT 131.6* 11/09/2010   CHOL 149 11/23/2011   HDL 61 11/23/2011   TRIG 64 11/23/2011    Last eye exam and foot exam:    Component Value Date/Time   HMDIABEYEEXA no diabetic retinopathy 02/01/2012   HMDIABFOOTEX done 11/2011    Assessment: Diabetes control: controlled Progress toward goals: at goal Barriers to meeting goals: no barriers identified  Plan: Diabetes treatment: continue current medications Metformin 1000mg  BID Refer to: none Instruction/counseling given: discussed diet

## 2012-03-14 NOTE — Assessment & Plan Note (Signed)
Patient with 2 weeks of pain in the left breast and what feels like a lump to her. On physical examination, a distinct lump was not appreciated by myself, or my attending, Dr. Josem Kaufmann; however, patient is certain that she feels a lump which is painful to her. May represent a benign cyst, however it warrants further workup for possible malignancy. Reassuringly, she has no adenopathy or overlying skin changes to suggest an inflammatory malignancy. -Patient was scheduled for diagnostic mammogram of the left breast on 03-21-12 as well as a screening mammogram of the right breast. -Will return to clinic within 2 weeks, after her mammogram, to discuss results. In the meantime she was instructed to monitor with regular self exams for any changes in breast tissue or pain

## 2012-03-14 NOTE — Assessment & Plan Note (Signed)
Lab Results  Component Value Date   NA 145 11/09/2011   K 3.6 11/09/2011   CL 104 11/09/2011   CO2 30 11/09/2011   BUN 15 11/09/2011   CREATININE 0.76 11/09/2011   CREATININE 0.71 01/01/2010    BP Readings from Last 3 Encounters:  03/14/12 120/70  02/03/12 130/84  11/23/11 138/81    Assessment: Hypertension control:  controlled  Progress toward goals:  at goal Barriers to meeting goals:  no barriers identified  Plan: Hypertension treatment:  continue current medications Exforge (amlodipine-valsartan 5/320 qd, metoprolol 25mg  qd)

## 2012-03-14 NOTE — Progress Notes (Signed)
Patient ID: Janice Massey, female   DOB: August 12, 1955, 57 y.o.   MRN: [id number]  Subjective:   Patient ID: Janice Massey female   DOB: 1955-08-09 57 y.o.   MRN: [id number]  HPI: [name] is a 57 y.o. [name] is a 57 y.o. female w pmh of T2DM, HTN, hyperlipidemia, bilateral deafness ventricular septal defect w chronic systolic heart murmur presenting with complaints of L breast + shoulder pain Patient reports that over the past 2 weeks, she is noticed pain and what feels like a knot in the lower lateral aspect of her left breast. She denies any antecedent trauma or overlying skin changes. Denies nipple discharge or adenopathy. She's never had this sensation before. She checks her breasts occasionally in the shower, maybe a few times monthly. She has also had L shoulder pain, which is sharp, and encircles her L upper arm and shoulder. Pain is worse with movement of arm or lying on arm. No numbness or tingling. No heavy lifting or injury. Strength is limited by pain. In terms of her chronic medical problems, she reports her blood pressure is under good control under current regimen and she takes all meds regularly. Blood sugars in goal range, but did not bring meter today.    Past Medical History  Diagnosis Date  . Diabetes mellitus   . Hypertension   . Back pain   . Chronic PID   . Ventricular septal defect 2004 per echo   Current Outpatient Prescriptions  Medication Sig Dispense Refill  . acetaminophen (TYLENOL) 325 MG tablet Take 2 tablets (650 mg total) by mouth every 4 (four) hours as needed for pain.  100 tablet  2  . albuterol (PROVENTIL HFA;VENTOLIN HFA) 108 (90 BASE) MCG/ACT inhaler Inhale 2 puffs into the lungs every 4 (four) hours as needed for wheezing.  1 Inhaler  0  . amLODipine-valsartan (EXFORGE) 5-320 MG per tablet Take 1 tablet by mouth daily.  30 tablet  6  . BD ULTRA-FINE LANCETS lancets To test blood glucose daily       . Blood Glucose Monitoring Suppl (FREESTYLE  LITE) DEVI Check blood glucose once daily before breakfast or as directed       . glucose blood (FREESTYLE LITE) test strip To test blood sugar one time daily       . hydrochlorothiazide (HYDRODIURIL) 25 MG tablet Take 1 tablet (25 mg total) by mouth daily.  30 tablet  6  . metFORMIN (GLUCOPHAGE) 1000 MG tablet Take 1 tablet (1,000 mg total) by mouth 2 (two) times daily.  60 tablet  11  . metoprolol succinate (TOPROL-XL) 25 MG 24 hr tablet Take 1 tablet (25 mg total) by mouth daily.  30 tablet  6  . pantoprazole (PROTONIX) 40 MG tablet Take 1 tablet (40 mg total) by mouth daily.  30 tablet  6  . pravastatin (PRAVACHOL) 40 MG tablet Take 1 tablet (40 mg total) by mouth daily.  30 tablet  6   No current facility-administered medications for this visit.   No family history on file. History   Social History  . Marital Status: Single    Spouse Name: N/A    Number of Children: 1  . Years of Education: N/A   Occupational History  .     Social History Main Topics  . Smoking status: Never Smoker   . Smokeless tobacco: Not on file  . Alcohol Use: No  . Drug Use: No  . Sexually Active: Not  on file   Other Topics Concern  . Not on file   Social History Narrative  . No narrative on file   Review of Systems: 10 pt ROS performed, pertinent positives and negatives noted in HPI   Objective:  Physical Exam: Filed Vitals:   03/14/12 1456  BP: 120/70  Pulse: 74  Temp: 98 F (36.7 C)  TempSrc: Oral  Weight: 192 lb 3.2 oz (87.181 kg)  SpO2: 97%   Constitutional: Vital signs reviewed. Patient is a well-developed and well-nourished female in no acute distress and cooperative with exam. Alert and oriented x3.  Head: Normocephalic and atraumatic  Mouth: no erythema or exudates, MMM  Eyes: PERRL, EOMI, conjunctivae normal, No scleral icterus.  Neck: Supple, trachea midline normal ROM, No JVD, mass, or thyromegaly  Cardiovascular: RRR, S1 normal, S2 normal, 3/6 holosystolic murmur,  loudest at LUSB  Pulmonary/Chest: CTAB, no wheezes, rales, or rhonchi  Breast: Patient with pain to palpation of L lateral inferior breast tissue. Unable to appreciate discrete lump on exam. No overlying skin changes or nipple discharge. No axillary adenopathy Abdominal: Soft. Non-tender, non-distended, bowel sounds are normal, no masses  Musculoskeletal: Patient with pain on L shoulder abduction, adduction+external rotation. TTP reproduced with palpation of shoulder bursa.  Hematology: no cervical or axillary LAD Neurological: A&O x3, Strength is normal and symmetric bilaterally, cranial nerve II-XII are grossly intact, no focal motor deficit, sensory intact to light touch bilaterally.  Skin: Warm, dry and intact. No rash, cyanosis, or clubbing.  Psychiatric: Normal mood and affect.      Shoulder Injection Procedure Note Pre-operative Diagnosis: left shoulder bursitis Post-operative Diagnosis: left shoulder bursitis Indications: Symptom relief from bursitis Anesthesia: Lidocaine 1% without epinephrine Procedure Details  Verbal consent was obtained for the procedure. The shoulder was prepped with iodine and the skin was anesthetized with cooling spray. Using a 27 gauge needle the glenohumeral joint is injected with 1 mL 1% lidocaine and 1 mL of triamcinolone (KENALOG) 40mg /ml under the lateral aspect of the acromion. The injection site was cleansed a band-aid was applied. Complications:  None; patient tolerated the procedure well.   Assessment & Plan:

## 2012-03-14 NOTE — Assessment & Plan Note (Signed)
Lab Results  Component Value Date   CHOL 149 11/23/2011   HDL 61 11/23/2011   LDLCALC 75 11/23/2011   TRIG 64 11/23/2011   CHOLHDL 2.4 11/23/2011   LDL at goal. Continue pravastatin daily.

## 2012-03-14 NOTE — Assessment & Plan Note (Signed)
Patient symptoms are consistent with a left shoulder bursitis. Tolerated injection with Kenalog and lidocaine well with no immediate post procedure complications (see procedure note for full details).. She did experience nearly immediate post procedure relief of pain. Will reevaluate symptoms upon return to clinic. May need further imaging.

## 2012-03-29 ENCOUNTER — Other Ambulatory Visit: Payer: Self-pay

## 2012-03-29 ENCOUNTER — Ambulatory Visit (INDEPENDENT_AMBULATORY_CARE_PROVIDER_SITE_OTHER): Payer: Medicaid Other | Admitting: Internal Medicine

## 2012-03-29 ENCOUNTER — Other Ambulatory Visit: Payer: Self-pay | Admitting: Internal Medicine

## 2012-03-29 ENCOUNTER — Encounter: Payer: Self-pay | Admitting: Internal Medicine

## 2012-03-29 VITALS — BP 123/87 | HR 67 | Temp 99.0°F | Ht 67.0 in | Wt 190.1 lb

## 2012-03-29 DIAGNOSIS — H109 Unspecified conjunctivitis: Secondary | ICD-10-CM | POA: Insufficient documentation

## 2012-03-29 DIAGNOSIS — M719 Bursopathy, unspecified: Secondary | ICD-10-CM

## 2012-03-29 DIAGNOSIS — E119 Type 2 diabetes mellitus without complications: Secondary | ICD-10-CM

## 2012-03-29 DIAGNOSIS — M7552 Bursitis of left shoulder: Secondary | ICD-10-CM

## 2012-03-29 DIAGNOSIS — N644 Mastodynia: Secondary | ICD-10-CM

## 2012-03-29 DIAGNOSIS — Z1231 Encounter for screening mammogram for malignant neoplasm of breast: Secondary | ICD-10-CM

## 2012-03-29 MED ORDER — ERYTHROMYCIN 5 MG/GM OP OINT: TOPICAL_OINTMENT | OPHTHALMIC | Status: DC

## 2012-03-29 NOTE — Patient Instructions (Addendum)
1. Please call your eye doctor and try to get a follow up appointment. I have prescribed an antibiotic ointment for you to use, in case this is a bacterial infection. You can put the ointment inside the lower lid. Close the eye for 1 to 2 minutes. Roll eyeball around. 2. Please keep an eye on your left breast, if you notice any more changes please return to the clinic.  Conjunctivitis Conjunctivitis is commonly called "pink eye." Conjunctivitis can be caused by bacterial or viral infection, allergies, or injuries. There is usually redness of the lining of the eye, itching, discomfort, and sometimes discharge. There may be deposits of matter along the eyelids. A viral infection usually causes a watery discharge, while a bacterial infection causes a yellowish, thick discharge. Pink eye is very contagious and spreads by direct contact. You may be given antibiotic eyedrops as part of your treatment. Before using your eye medicine, remove all drainage from the eye by washing gently with warm water and cotton balls. Continue to use the medication until you have awakened 2 mornings in a row without discharge from the eye. Do not rub your eye. This increases the irritation and helps spread infection. Use separate towels from other household members. Wash your hands with soap and water before and after touching your eyes. Use cold compresses to reduce pain and sunglasses to relieve irritation from light. Do not wear contact lenses or wear eye makeup until the infection is gone. SEEK MEDICAL CARE IF:   Your symptoms are not better after 3 days of treatment.  You have increased pain or trouble seeing.  The outer eyelids become very red or swollen. Document Released: 01/29/2004 Document Revised: 03/15/2011 Document Reviewed: 12/21/2004 [facility] Patient Information 2013 [place], [place].

## 2012-03-29 NOTE — Assessment & Plan Note (Signed)
Improved since L shoulder injection.

## 2012-03-29 NOTE — Assessment & Plan Note (Signed)
Patient says breast pain is improved. Diagnostic MMG of L breast and U/S of L breast were nonrevealing. I think this may be related to shoulder pain. She says occasionally she can still feel lump, and occasionally not. There is no discrete mass to biopsy at this time. - Will continue to follow clinically with repeat imaging as needed

## 2012-03-29 NOTE — Progress Notes (Signed)
Patient ID: Janice Massey, female   DOB: 1955-11-12, 56 y.o.   MRN: [id number]  Subjective:   Patient ID: Janice Massey female   DOB: 1955-04-08 57 y.o.   MRN: [id number]  HPI: [name] is a 57 y.o. female with history of DM, HTN, VSD presenting for an acute visit for followup of left shoulder and breast pain. At her clinic visit on March 11, she complained of left lateral breast pain and thought she felt a lump. At that time, no lump or masses appreciated by myself or Dr. Josem Kaufmann. She also complained of some left shoulder pain concerning for bursitis. We injected her left shoulder with steroid, and she reports that since then pain is much improved. We also referred her for diagnostic mammogram of left breast. She underwent diagnostic mammogram of left breast as well as ultrasound of left breast. Both were unremarkable for mass with no areas suspicious for malignancy. Today she says she thinks that the area is improved, and says that sometimes she can feel a lump in some time she cannot. Of concern for her today is 4-5 days of a red, painful, and irritated right eye. She saw her eye doctor a couple of days ago, and has been using over-the-counter drops which they recommended. She cannot remember the name. She says symptoms have only worsened, with increased clear drainage from eye, painful irritation, and increased irritation with sunlight. Visual acuity is good. Denies nasal congestion, cough, fever, chills.  Past Medical History  Diagnosis Date  . Diabetes mellitus   . Hypertension   . Back pain   . Chronic PID   . Ventricular septal defect 2004 per echo   Current Outpatient Prescriptions  Medication Sig Dispense Refill  . acetaminophen (TYLENOL) 325 MG tablet Take 2 tablets (650 mg total) by mouth every 4 (four) hours as needed for pain.  100 tablet  2  . albuterol (PROVENTIL HFA;VENTOLIN HFA) 108 (90 BASE) MCG/ACT inhaler Inhale 2 puffs into the lungs every 4 (four) hours as needed for  wheezing.  1 Inhaler  0  . amLODipine-valsartan (EXFORGE) 5-320 MG per tablet Take 1 tablet by mouth daily.  30 tablet  6  . BD ULTRA-FINE LANCETS lancets To test blood glucose daily       . Blood Glucose Monitoring Suppl (FREESTYLE LITE) DEVI Check blood glucose once daily before breakfast or as directed       . erythromycin ophthalmic ointment Place about 1/2 inch of ointment in eye four times daily for 5 to 7 days  3.5 g  0  . glucose blood (FREESTYLE LITE) test strip To test blood sugar one time daily       . hydrochlorothiazide (HYDRODIURIL) 25 MG tablet Take 1 tablet (25 mg total) by mouth daily.  30 tablet  6  . metFORMIN (GLUCOPHAGE) 1000 MG tablet Take 1 tablet (1,000 mg total) by mouth 2 (two) times daily.  60 tablet  11  . metoprolol succinate (TOPROL-XL) 25 MG 24 hr tablet Take 1 tablet (25 mg total) by mouth daily.  30 tablet  6  . pantoprazole (PROTONIX) 40 MG tablet Take 1 tablet (40 mg total) by mouth daily.  30 tablet  6  . pravastatin (PRAVACHOL) 40 MG tablet Take 1 tablet (40 mg total) by mouth daily.  30 tablet  6   No current facility-administered medications for this visit.   No family history on file. History   Social History  . Marital Status: Single  Spouse Name: N/A    Number of Children: 1  . Years of Education: N/A   Occupational History  .     Social History Main Topics  . Smoking status: Never Smoker   . Smokeless tobacco: None  . Alcohol Use: No  . Drug Use: No  . Sexually Active: None   Other Topics Concern  . None   Social History Narrative  . None   Review of Systems: 10 pt ROS performed, pertinent positives and negatives noted in HPI Objective:  Physical Exam: Filed Vitals:   03/29/12 1018  BP: 123/87  Pulse: 67  Temp: 99 F (37.2 C)  TempSrc: Oral  Height: 5\' 7"  (1.702 m)  Weight: 190 lb 1.6 oz (86.229 kg)  SpO2: 97%   Vitals reviewed. General: sitting in chair NAD HEENT: right eye with prominently injected conjunctiva and  clear discharge. No crusting or pus. Pupil is reactive. PERRL, EOMI, no scleral icterus Cardiac: RRR, 3/6 holosystolic murmur, loudest at LUSB  Pulm: clear to auscultation bilaterally, no wheezes, rales, or rhonchi Breast: left breast is again palpated without masses, nipple discharge, or overlying skin changes Abd: soft, nontender, nondistended, BS present Ext: warm and well perfused, no pedal edema Neuro: alert and oriented X3, cranial nerves II-XII grossly intact, strength and sensation to light touch equal in bilateral upper and lower extremities   Assessment & Plan:   Please see problem-based charting for assessment and plan.

## 2012-03-29 NOTE — Assessment & Plan Note (Signed)
Has been going on for 4-5 days at this point. Has seen ophthalmologist several days ago, they recommended OTC drops which she says have not helped.  Do not think this is a true bacterial conjunctivitis, she has no purulent discharge and good visual acuity; does describe some eye pain and irritation in sunlight. More suspicious for allergic or viral conjunctivitis. Asked her to call back her ophthalmologist that she has just seen him earlier in the week and is getting worse. In the meantime, I will prescribed her erythromycin eye ointment in case this is a bacterial conjunctivitis and her symptoms get worse. Instructed her to use the ointment if symptoms worsen and she notices any crusting or purulent discharge from eye. Education provided on hygiene.

## 2012-04-13 ENCOUNTER — Other Ambulatory Visit (HOSPITAL_COMMUNITY): Payer: Self-pay | Admitting: Cardiovascular Disease

## 2012-04-13 DIAGNOSIS — Q210 Ventricular septal defect: Secondary | ICD-10-CM

## 2012-04-19 ENCOUNTER — Ambulatory Visit (HOSPITAL_COMMUNITY)
Admission: RE | Admit: 2012-04-19 | Discharge: 2012-04-19 | Disposition: A | Discharge status: Home / Self Care | Payer: Medicaid Other | Source: Ambulatory Visit | Attending: Cardiovascular Disease | Admitting: Cardiovascular Disease

## 2012-04-19 DIAGNOSIS — Q210 Ventricular septal defect: Secondary | ICD-10-CM | POA: Insufficient documentation

## 2012-04-19 NOTE — Progress Notes (Signed)
[facility]   2D echo completed 04/19/2012.   Cindy Ledon Weihe, RDCS  

## 2012-04-25 ENCOUNTER — Ambulatory Visit
Admission: RE | Admit: 2012-04-25 | Discharge: 2012-04-25 | Disposition: A | Discharge status: Home / Self Care | Payer: Self-pay | Source: Ambulatory Visit

## 2012-04-25 DIAGNOSIS — Z1231 Encounter for screening mammogram for malignant neoplasm of breast: Secondary | ICD-10-CM

## 2012-06-01 ENCOUNTER — Encounter: Payer: Self-pay | Admitting: Family Medicine

## 2012-06-01 ENCOUNTER — Ambulatory Visit (INDEPENDENT_AMBULATORY_CARE_PROVIDER_SITE_OTHER): Payer: No Typology Code available for payment source | Admitting: Family Medicine

## 2012-06-01 VITALS — BP 112/78 | HR 66 | Temp 98.0°F | Ht 63.0 in | Wt 180.9 lb

## 2012-06-01 DIAGNOSIS — K3189 Other diseases of stomach and duodenum: Secondary | ICD-10-CM

## 2012-06-01 DIAGNOSIS — E785 Hyperlipidemia, unspecified: Secondary | ICD-10-CM

## 2012-06-01 DIAGNOSIS — N390 Urinary tract infection, site not specified: Secondary | ICD-10-CM

## 2012-06-01 DIAGNOSIS — R1013 Epigastric pain: Secondary | ICD-10-CM

## 2012-06-01 DIAGNOSIS — N898 Other specified noninflammatory disorders of vagina: Secondary | ICD-10-CM

## 2012-06-01 DIAGNOSIS — E669 Obesity, unspecified: Secondary | ICD-10-CM

## 2012-06-01 DIAGNOSIS — N760 Acute vaginitis: Secondary | ICD-10-CM

## 2012-06-01 DIAGNOSIS — R300 Dysuria: Secondary | ICD-10-CM

## 2012-06-01 LAB — POCT UA - MICROSCOPIC ONLY
Epithelial cells, urine per micros: >20
WBC, Ur, HPF, POC: >20

## 2012-06-01 LAB — POCT WET PREP (WET MOUNT): Clue Cells Wet Prep Whiff POC: NEGATIVE

## 2012-06-01 LAB — POCT URINALYSIS DIPSTICK
Bilirubin, UA: NEGATIVE
Ketones, UA: NEGATIVE
Protein, UA: NEGATIVE
Spec Grav, UA: >=1.03
pH, UA: 5.5

## 2012-06-01 MED ORDER — FAMOTIDINE 20 MG PO TABS: 20.0000 mg | ORAL_TABLET | Freq: Two times a day (BID) | ORAL | Status: DC | PRN

## 2012-06-01 MED ORDER — IBUPROFEN 800 MG PO TABS: 800.0000 mg | ORAL_TABLET | Freq: Three times a day (TID) | ORAL | Status: DC | PRN

## 2012-06-01 MED ORDER — CEPHALEXIN 500 MG PO CAPS: 500.0000 mg | ORAL_CAPSULE | Freq: Three times a day (TID) | ORAL | Status: DC

## 2012-06-01 NOTE — Assessment & Plan Note (Signed)
Stomach discomfort for 2 days Does not sound like SBO without vomiting, normal PO, and+ flatus and BM- I did review red flags and reasons to seek care Possibly GERD although she doesn't think so, Possibly PUD Started pepcid, f/u in 2-4 weeks and sooner if not resolving Initially prescribed ibuprofen- called pharmacy and canceled as well as patient and instructed not to take it- she voiced understanding Ordered tramadol for pain 50 mg q6 PRN,

## 2012-06-01 NOTE — Assessment & Plan Note (Addendum)
With dysuria and abd pain and large LE on UA,  abd pain not likely related Treat with Keflex 500 TID X 7 days, called in Rx Sent urine for culture given her consistent UTI's to ensure we are covering properly.

## 2012-06-01 NOTE — Assessment & Plan Note (Signed)
Some small amount of thick white DC on exam  No clue cells, yeast, or trich on wet prep With concern for STD will add HIV to future orders.

## 2012-06-01 NOTE — Patient Instructions (Signed)
Thanks for coming in today, it was good to see you again  Be sure to watch out for persistent vomiting and worsening of pain like we talked about,  I will call you with you lab results and send prescriptions if you need them.   I have sent a higher dose ibuprofen that should help the pain.   Lets follow up in 2 weeks for your pain and concern for high blood sugar.

## 2012-06-01 NOTE — Assessment & Plan Note (Signed)
Pt concerned for DM2 with positive family Hx Ordered fasting BMP as well as fasting lipid panel given hyper triglyceridemia and fasting for glucose also.  F/u 2-4 weeks

## 2012-06-01 NOTE — Progress Notes (Signed)
  Subjective:    Patient ID: Brandi Mendez, female    DOB: October 13, 1955, 57 y.o.   MRN: [id number]  HPI Pt here for same day appt for abd pain and dysuria  Upper Abdominal pain started about 2 days ago, suddently, sharp and persistent without aggravating or alleviating factors, 400 mg ibuporofen not helping.  SHe has a Hx of SBO and is concerned of that.  + nausea, but no vomiting + passing gas and bowel movements, does feel constipated but had good BM 1 day ago No hematochezia or melena No dyspnea or chest pain No worsening of symptoms at night, doesn't feel like it is GERD  Dysuria Some burning when she urinates, also some vaginal discharge described as white with some vaginal itching as well.  She is sexually actuive, does not use condoms, has had a hysterectomy, and has ahd the same one partner for 10+ years.  She is concerned for STI's   Review of Systems Per HPI    Objective:   Physical Exam Gen: NAD, alert, cooperative with exam CV: RRR, good S1/S2, no murmur Resp: CTABL, no wheezes, non-labored Abd: Soft, mod tender to palpation of BL upper quadrants, some milder tenderness BL in Lower left and right quadrants Pelvic: vaginal mucosa pink and well rugated, no visible lapial lesions, minimal thick white dc in vault, cervix not seen- prior hyst        Assessment & Plan:

## 2012-06-01 NOTE — Addendum Note (Signed)
Addended by: Elenora Gamma on: 06/01/2012 04:56 PM   Modules accepted: Orders

## 2012-06-03 LAB — URINE CULTURE

## 2012-06-13 ENCOUNTER — Encounter: Payer: Self-pay | Admitting: Internal Medicine

## 2012-06-13 ENCOUNTER — Ambulatory Visit (INDEPENDENT_AMBULATORY_CARE_PROVIDER_SITE_OTHER): Payer: Medicaid Other | Admitting: Internal Medicine

## 2012-06-13 VITALS — BP 129/80 | HR 88 | Temp 97.4°F | Ht 65.0 in | Wt 191.5 lb

## 2012-06-13 DIAGNOSIS — K219 Gastro-esophageal reflux disease without esophagitis: Secondary | ICD-10-CM

## 2012-06-13 DIAGNOSIS — E785 Hyperlipidemia, unspecified: Secondary | ICD-10-CM

## 2012-06-13 DIAGNOSIS — I10 Essential (primary) hypertension: Secondary | ICD-10-CM

## 2012-06-13 DIAGNOSIS — E119 Type 2 diabetes mellitus without complications: Secondary | ICD-10-CM

## 2012-06-13 LAB — POCT GLYCOSYLATED HEMOGLOBIN (HGB A1C): Hemoglobin A1C: 6.6

## 2012-06-13 MED ORDER — PRAVASTATIN SODIUM 40 MG PO TABS: 40.0000 mg | ORAL_TABLET | Freq: Every day | ORAL | Status: DC

## 2012-06-13 MED ORDER — PANTOPRAZOLE SODIUM 40 MG PO TBEC: 40.0000 mg | DELAYED_RELEASE_TABLET | Freq: Every day | ORAL | Status: DC

## 2012-06-13 MED ORDER — METOPROLOL SUCCINATE ER 25 MG PO TB24: 25.0000 mg | ORAL_TABLET | Freq: Every day | ORAL | Status: DC

## 2012-06-13 MED ORDER — HYDROCHLOROTHIAZIDE 25 MG PO TABS: 25.0000 mg | ORAL_TABLET | Freq: Every day | ORAL | Status: DC

## 2012-06-13 MED ORDER — AMLODIPINE BESYLATE-VALSARTAN 5-320 MG PO TABS: 1.0000 | ORAL_TABLET | Freq: Every day | ORAL | Status: DC

## 2012-06-13 NOTE — Assessment & Plan Note (Signed)
Lab Results  Component Value Date   HGBA1C 6.6 06/13/2012   HGBA1C 6.8 02/03/2012   HGBA1C 6.7 11/09/2011     Assessment: Diabetes control: good control (HgbA1C at goal) Progress toward A1C goal:  at goal Comments:   Plan: Medications:  continue current medications Home glucose monitoring: Frequency: 2 times a day Timing: before breakfast;after lunch Instruction/counseling given: discussed foot care Educational resources provided: brochure Self management tools provided:

## 2012-06-13 NOTE — Progress Notes (Signed)
Patient ID: Janice Massey, female   DOB: 11/03/1955, 57 y.o.   MRN: [id number]  Subjective:   Patient ID: Janice Massey female   DOB: 1955-07-18 57 y.o.   MRN: [id number]  HPI: [name] Speaker is Massey 57 y.o. female with history of DM, HTN, deafness, and VSD presenting for followup of DM and HTN. In regards to her diabetes, she is taking metformin 1000 mg twice daily. She's had excellent control of her blood sugars, with most recent hemoglobin A1c of 6.8 at her last visit and now 6.6 today. Denies any signs or symptoms of hypoglycemia. Up-to-date on ophthalmologic examinations. No diabetic neuropathy, early diabetic nephropathy. For hypertension she is on amlodipine/valsartan 5/320 as well as hydrochlorothiazide 25 and metoprolol extended release 25. She's had excellent control of her blood pressure. Denies any headaches. She previously been evaluated for left shoulder pain that had radiated into her left breast. At that time, mammogram and ultrasound was done to rule out any actual lump in the breast. She also received Massey joint injection to her left shoulder at her prior visit. Since this time, the shoulders not been bothering her at all and she's had no shooting pains into her breast either. She's not palpated any lumps. Was stung by Massey bee on her knee last week, which swole for Massey couple of days and then improved. No shortness of breath.   Past Medical History  Diagnosis Date  . Diabetes mellitus   . Hypertension   . Back pain   . Chronic PID   . Ventricular septal defect 2004 per echo   Current Outpatient Prescriptions  Medication Sig Dispense Refill  . albuterol (PROVENTIL HFA;VENTOLIN HFA) 108 (90 BASE) MCG/ACT inhaler Inhale 2 puffs into the lungs every 4 (four) hours as needed for wheezing.  1 Inhaler  0  . amLODipine-valsartan (EXFORGE) 5-320 MG per tablet Take 1 tablet by mouth daily.  30 tablet  6  . BD ULTRA-FINE LANCETS lancets To test blood glucose daily       . Blood Glucose Monitoring  Suppl (FREESTYLE LITE) DEVI Check blood glucose once daily before breakfast or as directed       . erythromycin ophthalmic ointment Place about 1/2 inch of ointment in eye four times daily for 5 to 7 days  3.5 g  0  . glucose blood (FREESTYLE LITE) test strip To test blood sugar one time daily       . hydrochlorothiazide (HYDRODIURIL) 25 MG tablet Take 1 tablet (25 mg total) by mouth daily.  30 tablet  6  . metFORMIN (GLUCOPHAGE) 1000 MG tablet Take 1 tablet (1,000 mg total) by mouth 2 (two) times daily.  60 tablet  11  . metoprolol succinate (TOPROL-XL) 25 MG 24 hr tablet Take 1 tablet (25 mg total) by mouth daily.  30 tablet  6  . pantoprazole (PROTONIX) 40 MG tablet Take 1 tablet (40 mg total) by mouth daily.  30 tablet  6  . pravastatin (PRAVACHOL) 40 MG tablet Take 1 tablet (40 mg total) by mouth daily.  30 tablet  6   No current facility-administered medications for this visit.   No family history on file. History   Social History  . Marital Status: Single    Spouse Name: N/Massey    Number of Children: 1  . Years of Education: N/Massey   Occupational History  .     Social History Main Topics  . Smoking status: Never Smoker   . Smokeless tobacco:  None  . Alcohol Use: No  . Drug Use: No  . Sexually Active: None   Other Topics Concern  . None   Social History Narrative  . None   Review of Systems: 10 pt ROS performed, pertinent positives and negatives noted in HPI Objective:  Physical Exam: Filed Vitals:   06/13/12 1336  BP: 129/80  Pulse: 88  Temp: 97.4 F (36.3 C)  TempSrc: Oral  Height: 5\' 5"  (1.651 m)  Weight: 191 lb 8 oz (86.864 kg)  SpO2: 98%   General: sitting in chair NAD  HEENT: right eye with prominently injected conjunctiva and clear discharge. No crusting or pus. Pupil is reactive. PERRL, EOMI, no scleral icterus  Cardiac: RRR, 3/6 holosystolic murmur, loudest at LUSB  Pulm: clear to auscultation bilaterally, no wheezes, rales, or rhonchi  Abd: soft,  nontender, nondistended, BS present  Ext: warm and well perfused, no pedal edema. 2+ DP pulses bilaterally Neuro: alert and oriented X3, cranial nerves II-XII grossly intact, strength and sensation to light touch equal in bilateral upper and lower extremities  Assessment & Plan:   Please see problem-based charting for assessment and plan.

## 2012-06-13 NOTE — Progress Notes (Signed)
Case discussed with Dr. Ziemer (at time of visit, soon after the resident saw the patient).  We reviewed the resident's history and exam and pertinent patient test results.  I agree with the assessment, diagnosis, and plan of care documented in the resident's note.  

## 2012-06-13 NOTE — Assessment & Plan Note (Signed)
BP Readings from Last 3 Encounters:  06/13/12 129/80  03/29/12 123/87  03/14/12 120/70    Lab Results  Component Value Date   NA 145 11/09/2011   K 3.6 11/09/2011   CREATININE 0.76 11/09/2011    Assessment: Blood pressure control: controlled Progress toward BP goal:  at goal   Plan: Medications:  continue current medications Educational resources provided: brochure Self management tools provided:

## 2012-06-13 NOTE — Patient Instructions (Signed)
Keep up the great work! Come back in 3 months.

## 2012-06-22 ENCOUNTER — Encounter: Payer: Self-pay | Admitting: Family Medicine

## 2012-06-22 ENCOUNTER — Ambulatory Visit (INDEPENDENT_AMBULATORY_CARE_PROVIDER_SITE_OTHER): Payer: No Typology Code available for payment source | Admitting: Family Medicine

## 2012-06-22 VITALS — BP 110/78 | HR 84 | Ht 64.0 in | Wt 182.0 lb

## 2012-06-22 DIAGNOSIS — R300 Dysuria: Secondary | ICD-10-CM

## 2012-06-22 DIAGNOSIS — R1013 Epigastric pain: Secondary | ICD-10-CM

## 2012-06-22 DIAGNOSIS — K5900 Constipation, unspecified: Secondary | ICD-10-CM

## 2012-06-22 MED ORDER — FAMOTIDINE 20 MG PO TABS: 20.0000 mg | ORAL_TABLET | Freq: Two times a day (BID) | ORAL | Status: DC

## 2012-06-22 NOTE — Assessment & Plan Note (Addendum)
Improved, unclear as to what helped most Began pepcid and I will plan to continue for 6 additional weeks assuming that this could be PUD Pt also treated herself for constiopation with miralax Also treated UTI and dysuria although i felt that the initial pain was likely unrelated to her UTI  Continue pepcid, give rx for exactly 6 weeks worth- 84 pills Add Hpylori IgG to fasting labs she is planning to come give and plan Tx if she is positive Advised to avoid NSAIDs Follow up in 8 weeks, reviewed red flags of GI bleed

## 2012-06-22 NOTE — Progress Notes (Signed)
  Subjective:    Patient ID: Brandi Mendez, female    DOB: [birth year], 56 y.o.   MRN: [id number]  HPI Pt here to follow up on abd pain - initially upper abd, now lower abd Pain started getting better approx 2-3 days after starting pepcid. Now it is much more mild an dmostly lower quadrant. She complains that she did have some hard stools that responded well to miralax Occasionally worsens after she eats She denies hematochezia or melena She has 1 BM approx every 2 days Hx of SBO, no vomiting Colonoscopy 5-10 years ago and not sure of results Tramadol is helping and she is only taking it occasionally.   Dysuria is resolved No dyspnea or chest pain Still wondering if she has DM2- will schedule a lab appt   Review of Systems Per HPI    Objective:   Physical Exam   Gen: NAD, alert, cooperative with exam HEENT: NCAT CV: RRR, good S1/S2, no murmur Resp: CTABL, no wheezes, non-labored Abd: SNTND, BS present, no guarding or organomegaly Ext: No edema, warm Neuro: Alert and oriented, No gross deficits     Assessment & Plan:

## 2012-06-22 NOTE — Assessment & Plan Note (Signed)
Has regular BMs approx once every 2 days, get good relief with miralax approx once weekly Possible contributor to recent abd pain Advised to continue her current miralax use as she seems to be using it appropriately  F/u 8 weeks

## 2012-06-22 NOTE — Patient Instructions (Signed)
  Follow up in 2 months Please schedule  Lab appointment  Avoid advil and aleve. Tylenol is good! Lets continue taking pepcid for 6 weeks I will add test to your blood draw to see if you have H. pylori

## 2012-06-30 ENCOUNTER — Other Ambulatory Visit (INDEPENDENT_AMBULATORY_CARE_PROVIDER_SITE_OTHER): Payer: No Typology Code available for payment source

## 2012-06-30 DIAGNOSIS — N898 Other specified noninflammatory disorders of vagina: Secondary | ICD-10-CM

## 2012-06-30 DIAGNOSIS — R1013 Epigastric pain: Secondary | ICD-10-CM

## 2012-06-30 DIAGNOSIS — E669 Obesity, unspecified: Secondary | ICD-10-CM

## 2012-06-30 DIAGNOSIS — E785 Hyperlipidemia, unspecified: Secondary | ICD-10-CM

## 2012-06-30 DIAGNOSIS — N760 Acute vaginitis: Secondary | ICD-10-CM

## 2012-06-30 LAB — COMPREHENSIVE METABOLIC PANEL
ALT: 11 U/L (ref 0–35)
AST: 14 U/L (ref 0–37)
Alkaline Phosphatase: 60 U/L (ref 39–117)
BUN: 16 mg/dL (ref 6–23)
Calcium: 9.4 mg/dL (ref 8.4–10.5)
Chloride: 107 mEq/L (ref 96–112)
Creat: 0.63 mg/dL (ref 0.50–1.10)
Total Bilirubin: 0.7 mg/dL (ref 0.3–1.2)

## 2012-06-30 LAB — LIPID PANEL
HDL: 36 mg/dL — ABNORMAL LOW (ref >39)
LDL Cholesterol: 100 mg/dL — ABNORMAL HIGH (ref 0–99)
Total CHOL/HDL Ratio: 4.3 Ratio
VLDL: 20 mg/dL (ref 0–40)

## 2012-06-30 LAB — POCT H PYLORI SCREEN: H Pylori Screen, POC: NEGATIVE

## 2012-06-30 LAB — HIV ANTIBODY (ROUTINE TESTING W REFLEX): HIV: NONREACTIVE

## 2012-06-30 NOTE — Progress Notes (Signed)
CMP,FLP,HIV AND HPYLORI DONE TODAY Brandi Mendez

## 2012-07-01 ENCOUNTER — Emergency Department (INDEPENDENT_AMBULATORY_CARE_PROVIDER_SITE_OTHER)
Admission: EM | Admit: 2012-07-01 | Discharge: 2012-07-01 | Disposition: A | Discharge status: Home / Self Care | Payer: No Typology Code available for payment source | Source: Home / Self Care

## 2012-07-01 ENCOUNTER — Encounter (HOSPITAL_COMMUNITY): Payer: Self-pay | Admitting: Emergency Medicine

## 2012-07-01 DIAGNOSIS — R071 Chest pain on breathing: Secondary | ICD-10-CM

## 2012-07-01 DIAGNOSIS — R0789 Other chest pain: Secondary | ICD-10-CM

## 2012-07-01 DIAGNOSIS — J309 Allergic rhinitis, unspecified: Secondary | ICD-10-CM

## 2012-07-01 MED ORDER — FEXOFENADINE HCL 180 MG PO TABS: 180.0000 mg | ORAL_TABLET | Freq: Every day | ORAL | Status: DC

## 2012-07-01 MED ORDER — PREDNISONE 20 MG PO TABS: ORAL_TABLET | ORAL | Status: DC

## 2012-07-01 MED ORDER — FLUTICASONE PROPIONATE 50 MCG/ACT NA SUSP: 2.0000 | Freq: Every day | NASAL | Status: DC

## 2012-07-01 NOTE — ED Provider Notes (Signed)
History    CSN: [id number] Arrival date & time 07/01/12  1359  First MD Initiated Contact with Patient 07/01/12 1504     Chief Complaint  Patient presents with  . URI   (Consider location/radiation/quality/duration/timing/severity/associated sxs/prior Treatment) HPI Comments: 57 year old female presents with a complaint of cough, runny nose, PND, scratchy throat and pain in the left lateral chest wall this associated with movement, deep breaths and cough. She denies shortness of breath or fever. She has been taking OTC medicine such as Robitussin and Alka-Seltzer cold.  History reviewed. No pertinent past medical history. Past Surgical History  Procedure Laterality Date  . Abdominal hysterectomy    . Cholecystectomy    . Abdominal hernia repair     No family history on file. History  Substance Use Topics  . Smoking status: Never Smoker   . Smokeless tobacco: Not on file  . Alcohol Use: Yes     Comment: sometimes   OB History   Grav Para Term Preterm Abortions TAB SAB Ect Mult Living                 Review of Systems  Constitutional: Negative.   HENT: Positive for congestion, rhinorrhea and postnasal drip. Negative for mouth sores.   Respiratory: Positive for cough. Negative for choking, chest tightness, shortness of breath, wheezing and stridor.   Cardiovascular: Negative for palpitations and leg swelling.       Chest wall pain  Gastrointestinal: Negative.   Genitourinary: Negative.   Skin: Negative.     Allergies  Review of patient's allergies indicates no known allergies.  Home Medications   Current Outpatient Rx  Name  Route  Sig  Dispense  Refill  . acetaminophen (TYLENOL) 500 MG tablet   Oral   Take 500 mg by mouth every 6 (six) hours as needed. For pain.         Marland Kitchen diclofenac (CATAFLAM) 50 MG tablet   Oral   Take 1 tablet (50 mg total) by mouth 3 (three) times daily. As needed for pain.   21 tablet   0   . famotidine (PEPCID) 20 MG tablet    Oral   Take 1 tablet (20 mg total) by mouth 2 (two) times daily.   84 tablet   0   . fexofenadine (ALLEGRA) 180 MG tablet   Oral   Take 1 tablet (180 mg total) by mouth daily.   20 tablet   0   . fluconazole (DIFLUCAN) 150 MG tablet   Oral   Take 1 tablet (150 mg total) by mouth once.   2 tablet   0   . fluticasone (FLONASE) 50 MCG/ACT nasal spray   Nasal   Place 2 sprays into the nose daily.   16 g   0   . ibuprofen (ADVIL,MOTRIN) 800 MG tablet   Oral   Take 1 tablet (800 mg total) by mouth every 8 (eight) hours as needed for pain.   30 tablet   0   . predniSONE (DELTASONE) 20 MG tablet      2 tabs po once daily x 5 days. Take with food.   10 tablet   0    BP 102/70  Pulse 78  Temp(Src) 98.5 F (36.9 C) (Oral)  Resp 18  SpO2 99% Physical Exam  Nursing note and vitals reviewed. Constitutional: She is oriented to person, place, and time. She appears well-developed and well-nourished. No distress.  HENT:  Bilateral TMs are normal Oropharynx with minor  streaky erythema. No exudates or swelling.  Neck: Normal range of motion. Neck supple.  Cardiovascular: Normal rate, regular rhythm and normal heart sounds.   Pulmonary/Chest: Effort normal and breath sounds normal. No respiratory distress. She has no wheezes. She has no rales. She exhibits tenderness.  Lungs are perfectly clear. Good expansion. No wheezing or other adventitious sounds. No pleural friction rub.  Abdominal: Soft. There is no tenderness.  Musculoskeletal: Normal range of motion. She exhibits tenderness. She exhibits no edema.  Tenderness in the lateral chest wall just below and over the left anterior axillary line and the midaxillary line.  Lymphadenopathy:    She has no cervical adenopathy.  Neurological: She is alert and oriented to person, place, and time.  Skin: Skin is warm and dry. No rash noted.  Psychiatric: She has a normal mood and affect.    ED Course  Procedures (including critical  care time) Labs Reviewed - No data to display No results found. 1. Allergic rhinitis due to allergen   2. Left-sided chest wall pain     MDM  Fluticasone nasal spray as directed. Prednisone 20 mg daily for 5 days. Take with food. Fexofenadine 180 mg daily Drink plenty of fluids. Followup with her primary care doctor as needed next week. May take acetaminophen for chest wall pain. For development of fever, shortness of breath, increasing cough or sickness seek medical attention promptly.  Hayden Rasmussen, NP 07/01/12 [phone]

## 2012-07-01 NOTE — ED Notes (Signed)
Pt c/o cold sxs onset 1 week... sxs include: nasal congestion, chest tightness that radiates to the sides, cough w/white sputum, runny nose... Denies: f/v/n/d, SOB, wheezing... Taking OTC cough meds w/no relief... She is alert w/no signs of acute distress.

## 2012-07-01 NOTE — ED Provider Notes (Signed)
Medical screening examination/treatment/procedure(s) were performed by non-physician practitioner and as supervising physician I was immediately available for consultation/collaboration.  Etter Royall, M.D.  Suede Greenawalt C Kenroy Timberman, MD 07/01/12 1930 

## 2012-07-14 ENCOUNTER — Encounter (HOSPITAL_COMMUNITY): Payer: Self-pay | Admitting: Family Medicine

## 2012-07-14 ENCOUNTER — Emergency Department (HOSPITAL_COMMUNITY)
Admission: EM | Admit: 2012-07-14 | Discharge: 2012-07-15 | Disposition: A | Discharge status: Home / Self Care | Payer: No Typology Code available for payment source | Attending: Emergency Medicine | Admitting: Emergency Medicine

## 2012-07-14 DIAGNOSIS — L720 Epidermal cyst: Secondary | ICD-10-CM

## 2012-07-14 DIAGNOSIS — L723 Sebaceous cyst: Secondary | ICD-10-CM | POA: Insufficient documentation

## 2012-07-14 DIAGNOSIS — Z79899 Other long term (current) drug therapy: Secondary | ICD-10-CM | POA: Insufficient documentation

## 2012-07-14 DIAGNOSIS — R221 Localized swelling, mass and lump, neck: Secondary | ICD-10-CM | POA: Insufficient documentation

## 2012-07-14 DIAGNOSIS — R220 Localized swelling, mass and lump, head: Secondary | ICD-10-CM | POA: Insufficient documentation

## 2012-07-14 MED ORDER — CEPHALEXIN 500 MG PO CAPS: 500.0000 mg | ORAL_CAPSULE | Freq: Four times a day (QID) | ORAL | Status: DC

## 2012-07-14 NOTE — ED Provider Notes (Signed)
History    This chart was scribed for a non-physician practitioner working with Brandi Munch, MD by Brandi Mendez, ED scribe. This patient was seen in room WTR8/WTR8 and the patient's care was started at 11:43 PM.  CSN: [id number] Arrival date & time 07/14/12  2302   Chief Complaint  Patient presents with  . Cyst   The history is provided by the patient and medical records. No language interpreter was used.   HPI Comments: Brandi Mendez is a 57 y.o. female who presents to the Emergency Department complaining of moderate, constant knot on head onset a few days ago.  She reports that her head is hurting in this area. She also denies injury to the site.  She denies any previous episode. She has not attempted to take anything for the pain and has not tried any other treatments.  Pt denies fever, chills, neck pain, LOC, hitting head, CP, SOB, N/V/D, weakness, numbness, dizziness, syncope.   History reviewed. No pertinent past medical history. Past Surgical History  Procedure Laterality Date  . Abdominal hysterectomy    . Cholecystectomy    . Abdominal hernia repair     No family history on file. History  Substance Use Topics  . Smoking status: Never Smoker   . Smokeless tobacco: Not on file  . Alcohol Use: Yes     Comment: sometimes   OB History   Grav Para Term Preterm Abortions TAB SAB Ect Mult Living                 Review of Systems  Constitutional: Negative for fever, diaphoresis, appetite change, fatigue and unexpected weight change.  HENT: Negative for mouth sores and neck stiffness.   Eyes: Negative for visual disturbance.  Respiratory: Negative for cough, chest tightness, shortness of breath and wheezing.   Cardiovascular: Negative for chest pain.  Gastrointestinal: Negative for nausea, vomiting, abdominal pain, diarrhea and constipation.  Endocrine: Negative for polydipsia, polyphagia and polyuria.  Genitourinary: Negative for dysuria, urgency, frequency and hematuria.   Musculoskeletal: Negative for back pain.  Skin: Negative for rash.       Swelling on the L side of the head  Allergic/Immunologic: Negative for immunocompromised state.  Neurological: Negative for syncope, light-headedness and headaches.  Hematological: Does not bruise/bleed easily.  Psychiatric/Behavioral: Negative for sleep disturbance. The patient is not nervous/anxious.   All other systems reviewed and are negative.    Allergies  Review of patient's allergies indicates no known allergies.  Home Medications   Current Outpatient Rx  Name  Route  Sig  Dispense  Refill  . acetaminophen (TYLENOL) 500 MG tablet   Oral   Take 500 mg by mouth every 6 (six) hours as needed. For pain.         . cephALEXin (KEFLEX) 500 MG capsule   Oral   Take 1 capsule (500 mg total) by mouth 4 (four) times daily.   40 capsule   0   . diclofenac (CATAFLAM) 50 MG tablet   Oral   Take 1 tablet (50 mg total) by mouth 3 (three) times daily. As needed for pain.   21 tablet   0   . famotidine (PEPCID) 20 MG tablet   Oral   Take 1 tablet (20 mg total) by mouth 2 (two) times daily.   84 tablet   0   . fexofenadine (ALLEGRA) 180 MG tablet   Oral   Take 1 tablet (180 mg total) by mouth daily.   20 tablet  0   . fluconazole (DIFLUCAN) 150 MG tablet   Oral   Take 1 tablet (150 mg total) by mouth once.   2 tablet   0   . fluticasone (FLONASE) 50 MCG/ACT nasal spray   Nasal   Place 2 sprays into the nose daily.   16 g   0   . ibuprofen (ADVIL,MOTRIN) 800 MG tablet   Oral   Take 1 tablet (800 mg total) by mouth every 8 (eight) hours as needed for pain.   30 tablet   0   . predniSONE (DELTASONE) 20 MG tablet      2 tabs po once daily x 5 days. Take with food.   10 tablet   0    BP 118/77  Pulse 70  Temp(Src) 98.2 F (36.8 C) (Oral)  Resp 18  SpO2 100% Physical Exam  Nursing note and vitals reviewed. Constitutional: She is oriented to person, place, and time. She appears  well-developed and well-nourished. No distress.  Awake, alert, nontoxic appearance  HENT:  Head: Normocephalic and atraumatic.  Mouth/Throat: Oropharynx is clear and moist. No oropharyngeal exudate.  Eyes: Conjunctivae are normal. No scleral icterus.  Neck: Normal range of motion. Neck supple.  Cardiovascular: Normal rate, regular rhythm, normal heart sounds and intact distal pulses.   No murmur heard. Pulmonary/Chest: Effort normal and breath sounds normal. No respiratory distress. She has no wheezes.  Abdominal: Soft. Bowel sounds are normal. She exhibits no mass. There is no tenderness. There is no rebound and no guarding.  Musculoskeletal: Normal range of motion. She exhibits no edema.  Neurological: She is alert and oriented to person, place, and time. She exhibits normal muscle tone. Coordination normal.  Speech is clear and goal oriented Moves extremities without ataxia  Skin: Skin is warm and dry. She is not diaphoretic.  3x3 discrete, mobile, mildly tender cystic structure located on the left scalp No erythema, induration or evidence of cellulitis or abscess  Psychiatric: She has a normal mood and affect.    ED Course  Procedures (including critical care time) DIAGNOSTIC STUDIES: Oxygen Saturation is 100% on RA, normal by my interpretation.   Filed Vitals:   07/14/12 2319  BP: 118/77  Pulse: 70  Temp: 98.2 F (36.8 C)  Resp: 18    COORDINATION OF CARE: 11:44 PM - Discussed ED treatment with pt at bedside including pain medication, antibiotics, and hot compress and pt agrees.   Labs Reviewed - No data to display No results found. 1. Epidermal cyst     MDM  Brandi Mendez presents with history and physical consistent with epidermal cyst of the left scalp. Cyst is discrete, mobile and mildly tender. No erythema or induration to indicate cellulitis or abscess. Patient without fevers or chills to indicate systemic infection. Will discharge with with Keflex to avoid skin  infection and have her followup with primary care physician for further evaluation.  I have also discussed reasons to return immediately to the ER.  Patient expresses understanding and agrees with plan.  I personally performed the services described in this documentation, which was scribed in my presence. The recorded information has been reviewed and is accurate.   Dahlia Client Kaynan Klonowski, PA-C 07/15/12 0018

## 2012-07-14 NOTE — ED Notes (Signed)
Patient states that she has had a knot to her head for 2 days. Denies injury. States area is sore to touch.

## 2012-07-15 MED ORDER — IBUPROFEN 800 MG PO TABS
800.0000 mg | ORAL_TABLET | Freq: Once | ORAL | Status: AC
  Administered 2012-07-15: 800 mg via ORAL
  Filled 2012-07-15: qty 1

## 2012-07-15 NOTE — ED Provider Notes (Signed)
  Medical screening examination/treatment/procedure(s) were performed by non-physician practitioner and as supervising physician I was immediately available for consultation/collaboration.    Gerhard Munch, MD 07/15/12 [phone]

## 2012-07-15 NOTE — ED Notes (Signed)
Pt ambulatory to exam room with steady gait.  

## 2012-07-24 ENCOUNTER — Emergency Department (HOSPITAL_COMMUNITY)
Admission: EM | Admit: 2012-07-24 | Discharge: 2012-07-24 | Disposition: A | Discharge status: Home / Self Care | Payer: No Typology Code available for payment source | Attending: Emergency Medicine | Admitting: Emergency Medicine

## 2012-07-24 ENCOUNTER — Encounter (HOSPITAL_COMMUNITY): Payer: Self-pay | Admitting: Emergency Medicine

## 2012-07-24 DIAGNOSIS — I889 Nonspecific lymphadenitis, unspecified: Secondary | ICD-10-CM | POA: Insufficient documentation

## 2012-07-24 MED ORDER — CEPHALEXIN 250 MG PO CAPS
500.0000 mg | ORAL_CAPSULE | Freq: Once | ORAL | Status: AC
  Administered 2012-07-24: 500 mg via ORAL
  Filled 2012-07-24: qty 2

## 2012-07-24 MED ORDER — IBUPROFEN 800 MG PO TABS: 800.0000 mg | ORAL_TABLET | Freq: Three times a day (TID) | ORAL | Status: DC | PRN

## 2012-07-24 MED ORDER — HYDROCODONE-ACETAMINOPHEN 5-325 MG PO TABS: 1.0000 | ORAL_TABLET | ORAL | Status: DC | PRN

## 2012-07-24 MED ORDER — CEPHALEXIN 500 MG PO CAPS: 500.0000 mg | ORAL_CAPSULE | Freq: Four times a day (QID) | ORAL | Status: DC

## 2012-07-24 MED ORDER — IBUPROFEN 400 MG PO TABS
800.0000 mg | ORAL_TABLET | Freq: Once | ORAL | Status: AC
  Administered 2012-07-24: 800 mg via ORAL
  Filled 2012-07-24: qty 2

## 2012-07-24 MED ORDER — HYDROCODONE-ACETAMINOPHEN 5-325 MG PO TABS
1.0000 | ORAL_TABLET | Freq: Once | ORAL | Status: AC
  Administered 2012-07-24: 1 via ORAL
  Filled 2012-07-24: qty 1

## 2012-07-24 NOTE — ED Notes (Signed)
Pt discharged.Vital signs stable and GCS 15 

## 2012-07-24 NOTE — ED Notes (Signed)
Pt c/o right sided head pain from cyst; pt sts pain; pt seen at [facility] for same

## 2012-07-24 NOTE — ED Provider Notes (Deleted)
   History    CSN: [id number] Arrival date & time 07/24/12  1713  First MD Initiated Contact with Patient 07/24/12 1903     Chief Complaint  Patient presents with  . Headache   (Consider location/radiation/quality/duration/timing/severity/associated sxs/prior Treatment) Patient is a 57 y.o. female presenting with headaches. The history is provided by the patient. No language interpreter was used.  Headache Location: Pain in right breast at site of mass she felt on self breast exam 3 weeks ago. Size of mass fluctuates, always tender. Associated symptoms: no fever    History reviewed. No pertinent past medical history. Past Surgical History  Procedure Laterality Date  . Abdominal hysterectomy    . Cholecystectomy    . Abdominal hernia repair     History reviewed. No pertinent family history. History  Substance Use Topics  . Smoking status: Never Smoker   . Smokeless tobacco: Not on file  . Alcohol Use: Yes     Comment: sometimes   OB History   Grav Para Term Preterm Abortions TAB SAB Ect Mult Living                 Review of Systems  Constitutional: Negative for fever.  Cardiovascular:       See HPI.  Allergic/Immunologic: Negative for immunocompromised state.  Neurological: Positive for headaches.    Allergies  Review of patient's allergies indicates no known allergies.  Home Medications   Current Outpatient Rx  Name  Route  Sig  Dispense  Refill  . acetaminophen (TYLENOL) 500 MG tablet   Oral   Take 500 mg by mouth every 6 (six) hours as needed. For pain.         Marland Kitchen diclofenac (CATAFLAM) 50 MG tablet   Oral   Take 1 tablet (50 mg total) by mouth 3 (three) times daily. As needed for pain.   21 tablet   0   . fluconazole (DIFLUCAN) 150 MG tablet   Oral   Take 1 tablet (150 mg total) by mouth once.   2 tablet   0   . ibuprofen (ADVIL,MOTRIN) 800 MG tablet   Oral   Take 1 tablet (800 mg total) by mouth every 8 (eight) hours as needed for pain.   30  tablet   0    BP 110/71  Pulse 60  Temp(Src) 98.2 F (36.8 C) (Oral)  Resp 18  SpO2 97% Physical Exam  Constitutional: She appears well-developed and well-nourished. No distress.  HENT:  Head: Atraumatic.  Pulmonary/Chest: Effort normal.  Genitourinary:  Right breast without swelling or redness. Small firm, mobile, tender mass measuring approximately 1 cm in diameter lateral to aereola. No nipple discharge.   Lymphadenopathy:    She has no axillary adenopathy.    ED Course  Procedures (including critical care time) Labs Reviewed - No data to display No results found. No diagnosis found. 1. Right breast mass  MDM  Young patient with no history of breast cancer now with small, painful mass in right breast that is persistent. Will refer to OB/GYN Clinic.  Arnoldo Hooker, PA-C 07/24/12 1930

## 2012-07-24 NOTE — ED Provider Notes (Signed)
   History    CSN: [id number] Arrival date & time 07/24/12  1713  First MD Initiated Contact with Patient 07/24/12 1903     Chief Complaint  Patient presents with  . Headache   (Consider location/radiation/quality/duration/timing/severity/associated sxs/prior Treatment) Patient is a 57 y.o. female presenting with headaches. The history is provided by the patient. No language interpreter was used.  Headache Associated symptoms: no fever   Associated symptoms comment:  She complains of painful swollen areas to left side of her head in front of her ear. No trauma. She denies recent hair treatments, hearing changes, inner ear pain, sore throat.   History reviewed. No pertinent past medical history. Past Surgical History  Procedure Laterality Date  . Abdominal hysterectomy    . Cholecystectomy    . Abdominal hernia repair     History reviewed. No pertinent family history. History  Substance Use Topics  . Smoking status: Never Smoker   . Smokeless tobacco: Not on file  . Alcohol Use: Yes     Comment: sometimes   OB History   Grav Para Term Preterm Abortions TAB SAB Ect Mult Living                 Review of Systems  Constitutional: Negative for fever.  Neurological: Positive for headaches.  All other systems reviewed and are negative.    Allergies  Review of patient's allergies indicates no known allergies.  Home Medications   Current Outpatient Rx  Name  Route  Sig  Dispense  Refill  . acetaminophen (TYLENOL) 500 MG tablet   Oral   Take 500 mg by mouth every 6 (six) hours as needed. For pain.         Marland Kitchen diclofenac (CATAFLAM) 50 MG tablet   Oral   Take 1 tablet (50 mg total) by mouth 3 (three) times daily. As needed for pain.   21 tablet   0   . fluconazole (DIFLUCAN) 150 MG tablet   Oral   Take 1 tablet (150 mg total) by mouth once.   2 tablet   0   . ibuprofen (ADVIL,MOTRIN) 800 MG tablet   Oral   Take 1 tablet (800 mg total) by mouth every 8 (eight)  hours as needed for pain.   30 tablet   0    BP 110/71  Pulse 60  Temp(Src) 98.2 F (36.8 C) (Oral)  Resp 18  SpO2 97% Physical Exam  Constitutional: She is oriented to person, place, and time. She appears well-developed and well-nourished. No distress.  Pulmonary/Chest: Effort normal.  Neurological: She is alert and oriented to person, place, and time.  Skin:  Pre-auricular lymph nodal swelling that is tender to palpation. No induration. No post-auricular swelling.   Psychiatric: She has a normal mood and affect.    ED Course  Procedures (including critical care time) Labs Reviewed - No data to display No results found. 1. Lymphadenitis   MDM  Findings consistent with lymphadenitis. Keflex and PCP follow up in 2-3 days.  Arnoldo Hooker, PA-C 07/24/12 1943

## 2012-07-25 NOTE — ED Provider Notes (Signed)
Medical screening examination/treatment/procedure(s) were performed by non-physician practitioner and as supervising physician I was immediately available for consultation/collaboration.   Carleene Cooper III, MD 07/25/12 1245

## 2012-07-26 ENCOUNTER — Encounter (HOSPITAL_COMMUNITY): Payer: Self-pay

## 2012-07-26 ENCOUNTER — Emergency Department (HOSPITAL_COMMUNITY)
Admission: EM | Admit: 2012-07-26 | Discharge: 2012-07-26 | Disposition: A | Discharge status: Home / Self Care | Payer: Medicaid Other | Attending: Emergency Medicine | Admitting: Emergency Medicine

## 2012-07-26 ENCOUNTER — Encounter: Payer: Self-pay | Admitting: Internal Medicine

## 2012-07-26 ENCOUNTER — Telehealth: Payer: Self-pay | Admitting: *Deleted

## 2012-07-26 ENCOUNTER — Ambulatory Visit (INDEPENDENT_AMBULATORY_CARE_PROVIDER_SITE_OTHER): Payer: Medicaid Other | Admitting: Internal Medicine

## 2012-07-26 DIAGNOSIS — Z8679 Personal history of other diseases of the circulatory system: Secondary | ICD-10-CM | POA: Insufficient documentation

## 2012-07-26 DIAGNOSIS — E1129 Type 2 diabetes mellitus with other diabetic kidney complication: Secondary | ICD-10-CM

## 2012-07-26 DIAGNOSIS — Y9389 Activity, other specified: Secondary | ICD-10-CM | POA: Insufficient documentation

## 2012-07-26 DIAGNOSIS — IMO0001 Reserved for inherently not codable concepts without codable children: Secondary | ICD-10-CM | POA: Insufficient documentation

## 2012-07-26 DIAGNOSIS — T6391XA Toxic effect of contact with unspecified venomous animal, accidental (unintentional), initial encounter: Secondary | ICD-10-CM

## 2012-07-26 DIAGNOSIS — I10 Essential (primary) hypertension: Secondary | ICD-10-CM

## 2012-07-26 DIAGNOSIS — N739 Female pelvic inflammatory disease, unspecified: Secondary | ICD-10-CM | POA: Insufficient documentation

## 2012-07-26 DIAGNOSIS — Y92009 Unspecified place in unspecified non-institutional (private) residence as the place of occurrence of the external cause: Secondary | ICD-10-CM | POA: Insufficient documentation

## 2012-07-26 DIAGNOSIS — T63441A Toxic effect of venom of bees, accidental (unintentional), initial encounter: Secondary | ICD-10-CM | POA: Insufficient documentation

## 2012-07-26 DIAGNOSIS — Z79899 Other long term (current) drug therapy: Secondary | ICD-10-CM | POA: Insufficient documentation

## 2012-07-26 DIAGNOSIS — Z880 Allergy status to penicillin: Secondary | ICD-10-CM | POA: Insufficient documentation

## 2012-07-26 DIAGNOSIS — E119 Type 2 diabetes mellitus without complications: Secondary | ICD-10-CM | POA: Insufficient documentation

## 2012-07-26 DIAGNOSIS — T63461A Toxic effect of venom of wasps, accidental (unintentional), initial encounter: Secondary | ICD-10-CM

## 2012-07-26 DIAGNOSIS — W57XXXA Bitten or stung by nonvenomous insect and other nonvenomous arthropods, initial encounter: Secondary | ICD-10-CM

## 2012-07-26 LAB — GLUCOSE, CAPILLARY: Glucose-Capillary: 111 mg/dL — ABNORMAL HIGH (ref 70–99)

## 2012-07-26 MED ORDER — DIPHENHYDRAMINE HCL 25 MG PO CAPS
25.0000 mg | ORAL_CAPSULE | Freq: Once | ORAL | Status: AC
  Administered 2012-07-26: 25 mg via ORAL
  Filled 2012-07-26: qty 1

## 2012-07-26 MED ORDER — TRAMADOL HCL 50 MG PO TABS
50.0000 mg | ORAL_TABLET | Freq: Once | ORAL | Status: AC
  Administered 2012-07-26: 50 mg via ORAL
  Filled 2012-07-26: qty 1

## 2012-07-26 MED ORDER — PREDNISONE 10 MG PO TABS: ORAL_TABLET | ORAL | Status: DC

## 2012-07-26 MED ORDER — TRAMADOL HCL 50 MG PO TABS: 50.0000 mg | ORAL_TABLET | Freq: Four times a day (QID) | ORAL | Status: DC | PRN

## 2012-07-26 MED ORDER — IBUPROFEN 600 MG PO TABS: 600.0000 mg | ORAL_TABLET | Freq: Three times a day (TID) | ORAL | Status: DC | PRN

## 2012-07-26 NOTE — Patient Instructions (Addendum)
General Instructions:  Please start prednisone today: take 40mg  today, then 30 mg tomorrow, then 20 mg the day after, and then 10mg  on the 4th day then stop.   If you notice worsening swelling or pain, chest pain, sob, itching, call the clinic right away or go directly to ED  Apply ice to affected area   Take benadryl for itching  Treatment Goals:  Goals (1 Years of Data) as of 07/26/12         As of Today As of Today 06/13/12 03/29/12 03/14/12     Blood Pressure    . Blood Pressure < 140/90  136/90 157/87 129/80 123/87 120/70     Result Component    . HEMOGLOBIN A1C < 7.0    6.6      . LDL CALC < 100            Progress Toward Treatment Goals:  Treatment Goal 07/26/2012  Hemoglobin A1C at goal  Blood pressure at goal    Self Care Goals & Plans:  Self Care Goal 07/26/2012  Manage my medications take my medicines as prescribed; bring my medications to every visit; refill my medications on time; follow the sick day instructions if I am sick  Monitor my health check my feet daily  Eat healthy foods eat more vegetables; eat fruit for snacks and desserts; eat foods that are low in salt; drink diet soda or water instead of juice or soda  Be physically active take a walk every day; find an activity I enjoy    Home Blood Glucose Monitoring 07/26/2012  Check my blood sugar -  When to check my blood sugar before meals     Care Management & Community Referrals:  Referral 03/14/2012  Referrals made for care management support none needed     Bee, Wasp, or [employer] Your caregiver has diagnosed you as having an insect sting. An insect sting appears as a red lump in the skin that sometimes has a tiny hole in the center, or it may have a stinger in the center of the wound. The most common stings are from wasps, hornets and bees. Individuals have different reactions to insect stings.  A normal reaction may cause pain, swelling, and redness around the sting site.  A localized  allergic reaction may cause swelling and redness that extends beyond the sting site.  A large local reaction may continue to develop over the next 12 to 36 hours.  On occasion, the reactions can be severe (anaphylactic reaction). An anaphylactic reaction may cause wheezing; difficulty breathing; chest pain; fainting; raised, itchy, red patches on the skin; a sick feeling to your stomach (nausea); vomiting; cramping; or diarrhea. If you have had an anaphylactic reaction to an insect sting in the past, you are more likely to have one again. HOME CARE INSTRUCTIONS   With bee stings, a small sac of poison is left in the wound. Brushing across this with something such as a credit card, or anything similar, will help remove this and decrease the amount of the reaction. This same procedure will not help a wasp sting as they do not leave behind a stinger and poison sac.  Apply a cold compress for 10 to 20 minutes every hour for 1 to 2 days, depending on severity, to reduce swelling and itching.  To lessen pain, a paste made of water and baking soda may be rubbed on the bite or sting and left on for 5 minutes.  To relieve  itching and swelling, you may use take medication or apply medicated creams or lotions as directed.  Only take over-the-counter or prescription medicines for pain, discomfort, or fever as directed by your caregiver.  Wash the sting site daily with soap and water. Apply antibiotic ointment on the sting site as directed.  If you suffered a severe reaction:  If you did not require hospitalization, an adult will need to stay with you for 24 hours in case the symptoms return.  You may need to wear a medical bracelet or necklace stating the allergy.  You and your family need to learn when and how to use an anaphylaxis kit or epinephrine injection.  If you have had a severe reaction before, always carry your anaphylaxis kit with you. SEEK MEDICAL CARE IF:   None of the above helps  within 2 to 3 days.  The area becomes red, warm, tender, and swollen beyond the area of the bite or sting.  You have an oral temperature above 102 F (38.9 C). SEEK IMMEDIATE MEDICAL CARE IF:  You have symptoms of an allergic reaction which are:  Wheezing.  Difficulty breathing.  Chest pain.  Lightheadedness or fainting.  Itchy, raised, red patches on the skin.  Nausea, vomiting, cramping or diarrhea. ANY OF THESE SYMPTOMS MAY REPRESENT A SERIOUS PROBLEM THAT IS AN EMERGENCY. Do not wait to see if the symptoms will go away. Get medical help right away. Call your local emergency services (911 in [place] DO NOT drive yourself to the hospital. MAKE SURE YOU:   Understand these instructions.  Will watch your condition.  Will get help right away if you are not doing well or get worse. Document Released: 12/21/2004 Document Revised: 03/15/2011 Document Reviewed: 06/07/2009 [facility] Patient Information 2014 [place], [place].

## 2012-07-26 NOTE — ED Notes (Signed)
Pt hearing impaired, family at bedside.  Pt states she was stung by a wasp 4 times last night at 1800.  Pt presents now d/t swelling to stings.  Bandages on R foot, R ankle, and R leg just below patella.

## 2012-07-26 NOTE — Assessment & Plan Note (Addendum)
Presents after bring stung several times on right leg yesterday while gardening.  Went to ED last night, symptomatic treatment advised.  More pain and swelling today making it difficult to walk. No relief with benadryl or tramadol.  No prior similar episodes. SOB yesterday, improved today.   -short prednisone taper 40 today, then 30, 20, and then 10 on 4th day -monitor cbgs with hx of diabetes and now prednisone -ibuprofen 600mg  q8h prn -ice to area -return or call if no improvement or worsening -if develops worsening SOB, chest pain, rash, hives, swelling, redness, go to ED right away and call clinic

## 2012-07-26 NOTE — ED Notes (Signed)
Signature pad not capturing.  

## 2012-07-26 NOTE — Progress Notes (Signed)
Subjective:   Patient ID: Janice Massey female   DOB: 1955/04/08 57 y.o.   MRN: [id number]  HPI: [name] is a 57 y.o. female with PMH of DM2, HTN, and VSD presenting to [facility] today for an acute visit s/p ED follow up.  She was gardening yesterday afternoon when she was attacked by several bees resulting in numerous stings on her right leg.  She went to the ED for the severe pain and swelling last night and was discharged from the ED with tramadol for pain and told to take benadryl.  She says both have not helped and that her swelling and pain is worse today.  She says yesterday she also had SOB and a little chest tightness and lots of itching.  Today, the breathing is better but her foot is more swollen, her leg is more painful making it difficult to walk.  She denies any vomiting, but did have nausea.  She denies prior similar episodes or stings in the past.  She believes all the singers are out.   Past Medical History  Diagnosis Date  . Diabetes mellitus   . Hypertension   . Back pain   . Chronic PID   . Ventricular septal defect 2004 per echo   Current Outpatient Prescriptions  Medication Sig Dispense Refill  . amLODipine-valsartan (EXFORGE) 5-320 MG per tablet Take 1 tablet by mouth daily.  30 tablet  6  . BD ULTRA-FINE LANCETS lancets To test blood glucose daily       . Blood Glucose Monitoring Suppl (FREESTYLE LITE) DEVI Check blood glucose once daily before breakfast or as directed       . erythromycin ophthalmic ointment Place about 1/2 inch of ointment in eye four times daily for 5 to 7 days  3.5 g  0  . glucose blood (FREESTYLE LITE) test strip To test blood sugar one time daily       . hydrochlorothiazide (HYDRODIURIL) 25 MG tablet Take 1 tablet (25 mg total) by mouth daily.  30 tablet  6  . metFORMIN (GLUCOPHAGE) 1000 MG tablet Take 1 tablet (1,000 mg total) by mouth 2 (two) times daily.  60 tablet  11  . metoprolol succinate (TOPROL-XL) 25 MG 24 hr tablet Take 1 tablet (25  mg total) by mouth daily.  30 tablet  6  . pantoprazole (PROTONIX) 40 MG tablet Take 1 tablet (40 mg total) by mouth daily.  30 tablet  6  . pravastatin (PRAVACHOL) 40 MG tablet Take 1 tablet (40 mg total) by mouth daily.  30 tablet  6  . traMADol (ULTRAM) 50 MG tablet Take 1 tablet (50 mg total) by mouth every 6 (six) hours as needed for pain.  15 tablet  0   No current facility-administered medications for this visit.   No family history on file. History   Social History  . Marital Status: Single    Spouse Name: N/A    Number of Children: 1  . Years of Education: N/A   Occupational History  .     Social History Main Topics  . Smoking status: Never Smoker   . Smokeless tobacco: None  . Alcohol Use: No  . Drug Use: No  . Sexually Active: None   Other Topics Concern  . None   Social History Narrative  . None   Review of Systems:  Constitutional:  Denies fever, chills, diaphoresis, appetite change and fatigue.   HEENT:  Deaf.  Denies congestion, sore throat,  rhinorrhea, sneezing, mouth sores, trouble swallowing, neck pain   Respiratory:  Improving SOB.  Denies DOE, cough, and wheezing.   Cardiovascular:  Denies chest pain, palpitations.  Gastrointestinal:  Nausea.  Denies vomiting, abdominal pain, diarrhea, constipation, blood in stool and abdominal distention.   Genitourinary:  Denies dysuria, urgency, frequency, hematuria, flank pain and difficulty urinating.   Musculoskeletal:  R leg swelling and pain, gait problem.    Skin:  Numerous bee sting marks on right leg  Neurological:  Denies dizziness, seizures, syncope, weakness, light-headedness, numbness and headaches.    Objective:  Physical Exam: Filed Vitals:   07/26/12 1550  BP: 136/90  Pulse: 81  Temp: 97.8 F (36.6 C)  TempSrc: Oral  Height: 5' 3.5" (1.613 m)  Weight: 190 lb 4.8 oz (86.32 kg)  SpO2: 99%   Vitals reviewed. General: sitting in chair, acute distress due to right leg pain HEENT: PERRL, EOMI,  no scleral icterus, hearing aids in place Cardiac: RRR, + loud blowing holosystolic murmur Pulm: clear to auscultation bilaterally, no wheezes, rales, or rhonchi Abd: soft, nontender, nondistended, BS present Ext: warm and well perfused, right foot swelling, non pitting, very tender to palpation whole left leg up to mid thigh, numerous sting marks present on legs and foot, no visible edema surrounding each bite, but small erythematous area where she was stung.   Neuro: alert and oriented X3, cranial nerves II-XII grossly intact, strength and sensation to light touch equal in bilateral upper and lower extremities  Assessment & Plan:  Discussed with Dr. Dalphine Handing Multiple bee stings to right leg: Short prednisone taper, ibuprofen prn, benadryl itching, ice to area

## 2012-07-26 NOTE — Assessment & Plan Note (Signed)
Lab Results  Component Value Date   HGBA1C 6.6 06/13/2012   HGBA1C 6.8 02/03/2012   HGBA1C 6.7 11/09/2011    Assessment: Diabetes control: good control (HgbA1C at goal) Progress toward A1C goal:  at goal  Plan: Medications:  continue current medications metformin Home glucose monitoring: Frequency:   Timing: before meals Instruction/counseling given: reminded to bring blood glucose meter & log to each visit Educational resources provided: brochure;handout Self management tools provided:   Other plans: monitor cbgs with prednisone use especially

## 2012-07-26 NOTE — ED Provider Notes (Signed)
History    CSN: [id number] Arrival date & time 07/26/12  0203  First MD Initiated Contact with Patient 07/26/12 0222     Chief Complaint  Patient presents with  . Insect Bite   (Consider location/radiation/quality/duration/timing/severity/associated sxs/prior Treatment) The history is provided by the patient.   57 year old female was working in her garden when she has done in the right foot and right leg. There were 3 stings. She's complaining of pain and swelling in his areas. Pain is moderate to severe and she rates it at 7/10. Nothing makes it better nothing makes it worse. She denies rash and denies any difficulty breathing or swallowing. Past Medical History  Diagnosis Date  . Diabetes mellitus   . Hypertension   . Back pain   . Chronic PID   . Ventricular septal defect 2004 per echo   Past Surgical History  Procedure Laterality Date  . Cholecystectomy  11/04/1993  . Tubal ligation  10/05/1978  . Colposcopy     No family history on file. History  Substance Use Topics  . Smoking status: Never Smoker   . Smokeless tobacco: Not on file  . Alcohol Use: No   OB History   Grav Para Term Preterm Abortions TAB SAB Ect Mult Living                 Review of Systems  All other systems reviewed and are negative.    Allergies  Codeine and Penicillins  Home Medications   Current Outpatient Rx  Name  Route  Sig  Dispense  Refill  . amLODipine-valsartan (EXFORGE) 5-320 MG per tablet   Oral   Take 1 tablet by mouth daily.   30 tablet   6   . erythromycin ophthalmic ointment      Place about 1/2 inch of ointment in eye four times daily for 5 to 7 days   3.5 g   0   . hydrochlorothiazide (HYDRODIURIL) 25 MG tablet   Oral   Take 1 tablet (25 mg total) by mouth daily.   30 tablet   6   . metFORMIN (GLUCOPHAGE) 1000 MG tablet   Oral   Take 1 tablet (1,000 mg total) by mouth 2 (two) times daily.   60 tablet   11   . metoprolol succinate (TOPROL-XL) 25 MG 24  hr tablet   Oral   Take 1 tablet (25 mg total) by mouth daily.   30 tablet   6   . pantoprazole (PROTONIX) 40 MG tablet   Oral   Take 1 tablet (40 mg total) by mouth daily.   30 tablet   6   . pravastatin (PRAVACHOL) 40 MG tablet   Oral   Take 1 tablet (40 mg total) by mouth daily.   30 tablet   6   . BD ULTRA-FINE LANCETS lancets      To test blood glucose daily          . Blood Glucose Monitoring Suppl (FREESTYLE LITE) DEVI      Check blood glucose once daily before breakfast or as directed          . glucose blood (FREESTYLE LITE) test strip      To test blood sugar one time daily          . traMADol (ULTRAM) 50 MG tablet   Oral   Take 1 tablet (50 mg total) by mouth every 6 (six) hours as needed for pain.   15 tablet  0    BP 157/87  Temp(Src) 98.9 F (37.2 C) (Oral)  Resp 18  SpO2 100% Physical Exam  Nursing note and vitals reviewed.  57 year old female, resting comfortably and in no acute distress. Vital signs are significant for hypertension with blood pressure 157/87. Oxygen saturation is 100%, which is normal. Head is normocephalic and atraumatic. PERRLA, EOMI. Oropharynx is clear. Neck is nontender and supple without adenopathy or JVD. Back is nontender and there is no CVA tenderness. Lungs are clear without rales, wheezes, or rhonchi. Chest is nontender. Heart has regular rate and rhythm without murmur. Abdomen is soft, flat, nontender without masses or hepatosplenomegaly and peristalsis is normoactive. Extremities have no cyanosis or edema, full range of motion is present. Can sit stings are present in the proximal aspect of the right lower leg, distal aspect of the right lower leg, and on the dorsum of the right foot. There is moderate swelling of the foot but there is no significant erythema or warmth. Overall picture is consistent with local reaction to insect sting. Skin is warm and dry without other rash. Neurologic: Mental status is  normal, cranial nerves are intact, there are no motor or sensory deficits.  ED Course  Procedures (including critical care time) Labs Reviewed - No data to display No results found. 1. Insect bites and stings, initial encounter     MDM  Insect stings of the right leg and foot without evidence of systemic reaction. She's given a dose of diphenhydramine and tramadol in the ED and sent home with prescription for tramadol. She is instructed on ice and elevation and told to use loratadine, diphenhydramine, acetaminophen, ibuprofen at home.  Dione Booze, MD 07/26/12 [phone]

## 2012-07-26 NOTE — Telephone Encounter (Signed)
Pt seen in ED last night for bee stings to rt leg and foot without evidence of systemic reaction. She was treated and sent home with instructions to ice, elevate,  take IBU/tylenol, loratadine. Pt's family called today and asked for pt to be seen with c/o pain and swelling to rt leg.  She has taken benadryl and percocet.  She has iced and elevated.   Will see today to evaluate.

## 2012-07-26 NOTE — Assessment & Plan Note (Signed)
BP Readings from Last 3 Encounters:  07/26/12 136/90  07/26/12 157/87  06/13/12 129/80   Lab Results  Component Value Date   NA 145 11/09/2011   K 3.6 11/09/2011   CREATININE 0.76 11/09/2011   Assessment: Blood pressure control: controlled Progress toward BP goal:  at goal  Plan: Medications:  continue current medications exforge, hctz, toprol Educational resources provided: brochure;handout;video

## 2012-07-27 NOTE — Progress Notes (Signed)
Case discussed with Dr. Qureshi at the time of the visit.  We reviewed the resident's history and exam and pertinent patient test results.  I agree with the assessment, diagnosis, and plan of care documented in the resident's note. 

## 2012-07-28 ENCOUNTER — Encounter: Payer: Self-pay | Admitting: Family Medicine

## 2012-07-28 ENCOUNTER — Ambulatory Visit (INDEPENDENT_AMBULATORY_CARE_PROVIDER_SITE_OTHER): Payer: No Typology Code available for payment source | Admitting: Family Medicine

## 2012-07-28 VITALS — BP 108/75 | HR 68 | Ht 64.0 in | Wt 174.2 lb

## 2012-07-28 DIAGNOSIS — R599 Enlarged lymph nodes, unspecified: Secondary | ICD-10-CM

## 2012-07-28 DIAGNOSIS — R590 Localized enlarged lymph nodes: Secondary | ICD-10-CM | POA: Insufficient documentation

## 2012-07-28 NOTE — Progress Notes (Signed)
  Subjective:    Patient ID: Brandi Mendez, female    DOB: 01-10-1955, 57 y.o.   MRN: [id number]  HPI Patient here for SDA after ED visit on 7/21 for lump which developed above left ear. Prescribed Keflex for adenopathy and is here for follow up.  She reports that since the ED visit the swelling is midly improved. No changes in hearing; has had some mild earache in the left ear.  No otorrhea.  No cough or fevers.   She is a never-smoker.  No recent hair treatments or scalp irritation.   Review of Systems See HPI    Objective:   Physical Exam Well appearing,no apparent distress HEENT Neck supple. No anterior/posterior or supraclavicular adenopathy noted.  There is a single enlarged lymph node superio/anterior to the L pinna, measuring 1.0 x 1.2cm. TMs clear bilaterally. No pain to manipulate the pinnae or tragi bilaterally. No scalp lesions or areas of alopecia. Hair in tight braids across scalp.  No sinus tenderness (maxillary or frontal sinuses).        Assessment & Plan:

## 2012-07-28 NOTE — Patient Instructions (Addendum)
The lump over your left ear appears like an inflamed lymph node, which may be enlarged due to a mild infection in the scalp or ear.  I do not feel any other enlarged lymph nodes on your body today.  The measurement of the node is 1cm x 1.2cm.  I ask that you come back to see Dr Ermalinda Memos in 2 weeks for recheck, and that you complete the Keflex medication as prescribed in the meantime..  Call to be seen sooner if you develop fevers, worsening enlargement or pain/redness of the left ear lump, other similar enlarged glands elsewhere on your body, or other concerns.   SCHEDULE FOLLOW UP WITH DR Ermalinda Memos IN 2 WEEKS.

## 2012-07-28 NOTE — Assessment & Plan Note (Signed)
Single enlarged lymph node, consider likely reactive (possibly scalp).  She is noticing mild improvement since ED visit 4 days ago and starting Keflex.  No erythema, no other adenopathy on head/neck or axillary exam.  She is a never-smoker.  Plan to recheck in the coming 2 weeks; if not improved or if worse, then may consider initiating workup to include CBC c diff, HIV, EBV, CMV.

## 2012-08-02 ENCOUNTER — Ambulatory Visit: Payer: No Typology Code available for payment source | Admitting: Family Medicine

## 2012-08-14 ENCOUNTER — Ambulatory Visit: Payer: Self-pay | Admitting: Family Medicine

## 2012-08-15 ENCOUNTER — Inpatient Hospital Stay (HOSPITAL_COMMUNITY)
Admission: EM | Admit: 2012-08-15 | Discharge: 2012-08-17 | DRG: 390 | Disposition: A | Discharge status: Home / Self Care | Payer: MEDICAID | Attending: Family Medicine | Admitting: Family Medicine

## 2012-08-15 ENCOUNTER — Inpatient Hospital Stay (HOSPITAL_COMMUNITY): Payer: Self-pay

## 2012-08-15 ENCOUNTER — Emergency Department (HOSPITAL_COMMUNITY): Payer: Self-pay

## 2012-08-15 ENCOUNTER — Encounter (HOSPITAL_COMMUNITY): Payer: Self-pay | Admitting: Emergency Medicine

## 2012-08-15 DIAGNOSIS — R110 Nausea: Secondary | ICD-10-CM | POA: Diagnosis present

## 2012-08-15 DIAGNOSIS — Z9089 Acquired absence of other organs: Secondary | ICD-10-CM

## 2012-08-15 DIAGNOSIS — Z90710 Acquired absence of both cervix and uterus: Secondary | ICD-10-CM

## 2012-08-15 DIAGNOSIS — R109 Unspecified abdominal pain: Secondary | ICD-10-CM | POA: Diagnosis present

## 2012-08-15 DIAGNOSIS — E669 Obesity, unspecified: Secondary | ICD-10-CM | POA: Diagnosis present

## 2012-08-15 DIAGNOSIS — K56609 Unspecified intestinal obstruction, unspecified as to partial versus complete obstruction: Principal | ICD-10-CM | POA: Diagnosis present

## 2012-08-15 DIAGNOSIS — R599 Enlarged lymph nodes, unspecified: Secondary | ICD-10-CM | POA: Diagnosis present

## 2012-08-15 DIAGNOSIS — K439 Ventral hernia without obstruction or gangrene: Secondary | ICD-10-CM | POA: Diagnosis present

## 2012-08-15 HISTORY — DX: Unspecified intestinal obstruction, unspecified as to partial versus complete obstruction: K56.609

## 2012-08-15 LAB — COMPREHENSIVE METABOLIC PANEL
ALT: 15 U/L (ref 0–35)
AST: 17 U/L (ref 0–37)
Albumin: 4.6 g/dL (ref 3.5–5.2)
CO2: 27 mEq/L (ref 19–32)
Calcium: 10.1 mg/dL (ref 8.4–10.5)
Chloride: 100 mEq/L (ref 96–112)
GFR calc non Af Amer: >90 mL/min (ref >90)
Sodium: 141 mEq/L (ref 135–145)
Total Bilirubin: 1.9 mg/dL — ABNORMAL HIGH (ref 0.3–1.2)

## 2012-08-15 LAB — CBC WITH DIFFERENTIAL/PLATELET
Basophils Absolute: 0 10*3/uL (ref 0.0–0.1)
Basophils Relative: 0 % (ref 0–1)
Lymphocytes Relative: 16 % (ref 12–46)
MCHC: 36.3 g/dL — ABNORMAL HIGH (ref 30.0–36.0)
Neutro Abs: 8.4 10*3/uL — ABNORMAL HIGH (ref 1.7–7.7)
Platelets: 303 10*3/uL (ref 150–400)
RDW: 12.7 % (ref 11.5–15.5)
WBC: 10.6 10*3/uL — ABNORMAL HIGH (ref 4.0–10.5)

## 2012-08-15 LAB — URINALYSIS, ROUTINE W REFLEX MICROSCOPIC
Glucose, UA: NEGATIVE mg/dL
Specific Gravity, Urine: 1.028 (ref 1.005–1.030)
Urobilinogen, UA: 1 mg/dL (ref 0.0–1.0)

## 2012-08-15 LAB — URINE MICROSCOPIC-ADD ON

## 2012-08-15 MED ORDER — MORPHINE SULFATE 4 MG/ML IJ SOLN
4.0000 mg | Freq: Once | INTRAMUSCULAR | Status: AC
  Administered 2012-08-15: 4 mg via INTRAVENOUS
  Filled 2012-08-15: qty 1

## 2012-08-15 MED ORDER — IOHEXOL 300 MG/ML  SOLN
100.0000 mL | Freq: Once | INTRAMUSCULAR | Status: AC | PRN
  Administered 2012-08-15: 100 mL via INTRAVENOUS

## 2012-08-15 MED ORDER — ONDANSETRON HCL 4 MG/2ML IJ SOLN
4.0000 mg | Freq: Once | INTRAMUSCULAR | Status: AC
  Administered 2012-08-15: 4 mg via INTRAVENOUS
  Filled 2012-08-15: qty 2

## 2012-08-15 MED ORDER — IOHEXOL 300 MG/ML  SOLN
25.0000 mL | INTRAMUSCULAR | Status: AC
  Administered 2012-08-15: 25 mL via ORAL

## 2012-08-15 MED ORDER — SODIUM CHLORIDE 0.9 % IV BOLUS (SEPSIS)
1000.0000 mL | Freq: Once | INTRAVENOUS | Status: AC
  Administered 2012-08-15: 1000 mL via INTRAVENOUS

## 2012-08-15 MED ORDER — SODIUM CHLORIDE 0.9 % IV SOLN
INTRAVENOUS | Status: AC
  Administered 2012-08-15 – 2012-08-16 (×2): via INTRAVENOUS

## 2012-08-15 NOTE — ED Notes (Addendum)
Abdominal pain since Sunday. N/V, diffuse abdominal pain, 8/10. Denies diarrhea, no problems with urinating.

## 2012-08-15 NOTE — ED Notes (Signed)
Report called to Nancy, RN

## 2012-08-15 NOTE — ED Provider Notes (Signed)
CSN: [id number]     Arrival date & time 08/15/12  1609 History     First MD Initiated Contact with Patient 08/15/12 1855     Chief Complaint  Patient presents with  . Abdominal Pain   (Consider location/radiation/quality/duration/timing/severity/associated sxs/prior Treatment) Patient is a 57 y.o. female presenting with abdominal pain.  Abdominal Pain Associated symptoms: vomiting (1 episdoe per HPI)   Associated symptoms: no constipation, no cough, no diarrhea, no dysuria, no fever, no nausea and no shortness of breath     MORGANNA STYLES is a 57 year old female with PMH of multiple SBOs, presents for 2 days of diffuse abdominal pain.  She reports that her pain has increased since Sunday, denies any nausea does admit one episode of vomiting which she reports she caused by sticking her fingers down her throat.  She admits 1 loose stool this AM but denies any diarrhea.  She has had recent antibiotic use for lymphadenitis. She does have a previous history of partial SBO which was treated medically in 2013. Patient has a past surgical history of abdominal hysterectomy, cholecystectomy, and abdominal hernia repair.  History reviewed. No pertinent past medical history. Past Surgical History  Procedure Laterality Date  . Abdominal hysterectomy    . Cholecystectomy    . Abdominal hernia repair     No family history on file. History  Substance Use Topics  . Smoking status: Never Smoker   . Smokeless tobacco: Not on file  . Alcohol Use: Yes     Comment: sometimes   OB History   Grav Para Term Preterm Abortions TAB SAB Ect Mult Living                 Review of Systems  Constitutional: Positive for appetite change (has not ate anything today). Negative for fever.  HENT: Negative for congestion and neck pain.   Eyes: Negative for visual disturbance.  Respiratory: Negative for cough and shortness of breath.   Gastrointestinal: Positive for vomiting (1 episdoe per HPI) and abdominal pain  (diffuse). Negative for nausea, diarrhea, constipation and abdominal distention.  Endocrine: Negative for polydipsia.  Genitourinary: Negative for dysuria and frequency.  Musculoskeletal: Negative for back pain and arthralgias.  Skin: Negative for rash.  Neurological: Negative for dizziness, light-headedness and headaches.  Psychiatric/Behavioral: Negative for agitation. The patient is not nervous/anxious.     Allergies  Review of patient's allergies indicates no known allergies.  Home Medications   Current Outpatient Rx  Name  Route  Sig  Dispense  Refill  . acetaminophen (TYLENOL) 500 MG tablet   Oral   Take 500 mg by mouth every 6 (six) hours as needed for pain. For pain.         Marland Kitchen diclofenac (CATAFLAM) 50 MG tablet   Oral   Take 1 tablet (50 mg total) by mouth 3 (three) times daily. As needed for pain.   21 tablet   0   . fluconazole (DIFLUCAN) 150 MG tablet   Oral   Take 1 tablet (150 mg total) by mouth once.   2 tablet   0   . HYDROcodone-acetaminophen (NORCO/VICODIN) 5-325 MG per tablet   Oral   Take 1 tablet by mouth every 6 (six) hours as needed for pain.         Marland Kitchen ibuprofen (ADVIL,MOTRIN) 800 MG tablet   Oral   Take 1 tablet (800 mg total) by mouth every 8 (eight) hours as needed for pain.   30 tablet  0   . TRAMADOL HCL PO   Oral   Take 1 tablet by mouth daily as needed (pain).          BP 111/80  Pulse 63  Temp(Src) 98.1 F (36.7 C) (Oral)  Resp 16  SpO2 100% Physical Exam  Nursing note and vitals reviewed. Constitutional: She is oriented to person, place, and time. She appears well-developed and well-nourished. No distress.  HENT:  Head: Normocephalic and atraumatic.  Eyes: Conjunctivae are normal. Right eye exhibits no discharge. Left eye exhibits no discharge.  Cardiovascular: Normal rate, regular rhythm and normal heart sounds.   Pulmonary/Chest: Effort normal and breath sounds normal. No respiratory distress.  Abdominal: Soft. She  exhibits distension (mild). There is tenderness (mild TTP RUQ and LUQ). There is no guarding.  Musculoskeletal: She exhibits no edema.  Neurological: She is alert and oriented to person, place, and time.  Skin: Skin is warm. She is not diaphoretic. No erythema.  Psychiatric: She has a normal mood and affect.    ED Course   Procedures (including critical care time)  Labs Reviewed  CBC WITH DIFFERENTIAL - Abnormal; Notable for the following:    WBC 10.6 (*)    MCHC 36.3 (*)    Neutrophils Relative % 79 (*)    Neutro Abs 8.4 (*)    All other components within normal limits  COMPREHENSIVE METABOLIC PANEL - Abnormal; Notable for the following:    Total Bilirubin 1.9 (*)    All other components within normal limits  URINALYSIS, ROUTINE W REFLEX MICROSCOPIC - Abnormal; Notable for the following:    Color, Urine AMBER (*)    APPearance CLOUDY (*)    Hgb urine dipstick SMALL (*)    Bilirubin Urine MODERATE (*)    Ketones, ur 15 (*)    Leukocytes, UA SMALL (*)    All other components within normal limits  URINE MICROSCOPIC-ADD ON - Abnormal; Notable for the following:    Squamous Epithelial / LPF FEW (*)    Bacteria, UA FEW (*)    Crystals CA OXALATE CRYSTALS (*)    All other components within normal limits   Dg Abd Acute W/chest  08/15/2012   *RADIOLOGY REPORT*  Clinical Data: Abdominal pain, nausea, vomiting  ACUTE ABDOMEN SERIES (ABDOMEN 2 VIEW & CHEST 1 VIEW)  Comparison: 04/26/2011  Findings: Cardiomediastinal silhouette is unremarkable.  No acute infiltrate or pleural effusion.  No pulmonary edema.  Mild distended small bowel loops in the left mid abdomen with air-fluid levels suspicious for early bowel obstruction.  No free abdominal air.  Post cholecystectomy surgical clips are noted.  Post hernia repair changes lower abdominal wall.  IMPRESSION: No acute disease within chest.  Mild distended small bowel loops in the left mid abdomen with air-fluid levels suspicious for early bowel  obstruction.  No free abdominal air.   Original Report Authenticated By: Natasha Mead, M.D.   1. Small bowel obstruction   2. Abdominal  pain, other specified site     MDM  57 year old female with PMH of multiple SBOs who presents today for complaints of diffuse abdominal pain.  Abdominal Xray has findings suspicious for early bowel obstruction. -Obtain CT abdomen and pelvis w contrast. Spoke to Dr. Gayleen Orem Intern, FM team will admit. Signed out patient to overnight Midlevel provider.  Carlynn Purl, DO 08/15/12 2055  Medical screening examination/treatment/procedure(s) were conducted as a shared visit with non-physician practitioner(s) or resident  and myself.  I personally evaluated the patient  during the encounter and agree with the findings and plan unless otherwise indicated.    2 days abd pain, central, possible similar to previous.  SBO and multiple surgery hx.  Mild distension on exam, diffuse mild abd pain worse centrally, no guarding.  RRR.  Xray concern for sbo with air fluid levels.  CT to clarify details.  FP resident spoken with for admission.  Signed out with plan to fup CT scan.  Fluids given in ED. Pain meds given.    Enid Skeens, MD 08/15/12 2102

## 2012-08-15 NOTE — ED Notes (Signed)
Patient finished contrast after report called to unit and as she was ready for transport to unit.

## 2012-08-16 ENCOUNTER — Encounter (HOSPITAL_COMMUNITY): Payer: Self-pay | Admitting: Family Medicine

## 2012-08-16 DIAGNOSIS — K56609 Unspecified intestinal obstruction, unspecified as to partial versus complete obstruction: Principal | ICD-10-CM

## 2012-08-16 DIAGNOSIS — R109 Unspecified abdominal pain: Secondary | ICD-10-CM | POA: Diagnosis present

## 2012-08-16 DIAGNOSIS — R110 Nausea: Secondary | ICD-10-CM | POA: Diagnosis present

## 2012-08-16 LAB — BASIC METABOLIC PANEL
BUN: 16 mg/dL (ref 6–23)
Calcium: 8.9 mg/dL (ref 8.4–10.5)
GFR calc Af Amer: >90 mL/min (ref >90)
GFR calc non Af Amer: >90 mL/min (ref >90)
Potassium: 3.5 mEq/L (ref 3.5–5.1)

## 2012-08-16 MED ORDER — ONDANSETRON HCL 4 MG/2ML IJ SOLN
4.0000 mg | Freq: Four times a day (QID) | INTRAMUSCULAR | Status: DC | PRN
  Administered 2012-08-17: 4 mg via INTRAVENOUS
  Filled 2012-08-16: qty 2

## 2012-08-16 MED ORDER — POLYETHYLENE GLYCOL 3350 17 G PO PACK
17.0000 g | PACK | Freq: Every day | ORAL | Status: DC
  Administered 2012-08-16 – 2012-08-17 (×2): 17 g via ORAL
  Filled 2012-08-16 (×2): qty 1

## 2012-08-16 MED ORDER — ENOXAPARIN SODIUM 40 MG/0.4ML ~~LOC~~ SOLN
40.0000 mg | SUBCUTANEOUS | Status: DC
  Administered 2012-08-16 – 2012-08-17 (×2): 40 mg via SUBCUTANEOUS
  Filled 2012-08-16: qty 0.4

## 2012-08-16 MED ORDER — MORPHINE SULFATE 2 MG/ML IJ SOLN
2.0000 mg | INTRAMUSCULAR | Status: DC | PRN
  Administered 2012-08-16 (×3): 2 mg via INTRAVENOUS
  Filled 2012-08-16 (×3): qty 1

## 2012-08-16 MED ORDER — MORPHINE SULFATE 2 MG/ML IJ SOLN: 2.0000 mg | Freq: Four times a day (QID) | INTRAMUSCULAR | Status: DC | PRN

## 2012-08-16 NOTE — Discharge Summary (Signed)
Family Medicine Teaching [facility] Discharge Summary  Patient name: Brandi Mendez Medical record number: [id number] Date of birth: [birth year] Age: 56 y.o. Gender: female Date of Admission: 08/15/2012  Date of Discharge: 08/17/12 Admitting Physician: Janit Pagan, MD  Primary Care Provider: Kevin Fenton, MD Consultants: none   Indication for Hospitalization: abdominal pain   Discharge Diagnoses/Problem List:  Abdominal pain  Constipation Obesity   Disposition: home   Discharge Condition: stable    Brief Hospital Course:  Brandi Mendez is a 57 y.o. year old female presenting with abdominal pain for 3 days . PMH is significant for small bowel obstruction, constipation, s/p abdominal hysterectomy, cholecystectomy and abdominal hernia repair.   # Abdominal pain with possible SBO: history of multiple abdominal surgery wwith previous admission for obstruction, most recently in April 2013. Never required surgery but had NG tube placed. + flatus, Loose stool early 8/12. Has no had any emesis but forced herself to vomit to help ease pain. Has been seen in clinic in May and June for similar abdominal pain symptoms. Has been advised to take Miralax and has been prescribed tramadol previously for pain.  - no active emesis during hospitalization but nausea still present  - CT showed no acute findings so do no suspect obstruction  - diet was advanced as tolerated and Miralax added due to narcotics for pain  - she had bowel movements the night of 8/13 but pain still in LLQ and LUQ,   Issues for Follow Up:  1. Abdominal pain: CT showed no acute findings, chronic pain from previous abdominal surgeries, she currently does not have a GI doctor.   Significant Procedures: none   Significant Labs and Imaging:   Recent Labs Lab 08/15/12 1654 08/17/12 1430  WBC 10.6* 8.7  HGB 14.5 13.4  HCT 40.0 37.6  PLT 303 264    Recent Labs Lab 08/15/12 1654 08/16/12 1100  NA 141 140  K 3.6  3.5  CL 100 106  CO2 27 24  GLUCOSE 99 87  BUN 16 16  CREATININE 0.79 0.74  CALCIUM 10.1 8.9  ALKPHOS 79  --   AST 17  --   ALT 15  --   ALBUMIN 4.6  --    8/13: Hgb A1C: 5.8  CT ABDOMEN AND PELVIS WITH CONTRAST  Comparison: CT of the abdomen and pelvis 04/24/2011.  IMPRESSION:  1. No acute findings in the abdomen or pelvis to account for patient's symptoms.  2. Status post cholecystectomy, hysterectomy of ventral hernia repair.  3. Mild anterior abdominal wall laxity and focal small ventral hernia along the inferior margin of the repair site, without evidence of incarceration or obstruction at this time.  4. Additional incidental findings, as above.  Outstanding Results: none  Discharge Medications:    Medication List    STOP taking these medications       fluconazole 150 MG tablet  Commonly known as:  DIFLUCAN      TAKE these medications       acetaminophen 500 MG tablet  Commonly known as:  TYLENOL  Take 500 mg by mouth every 6 (six) hours as needed for pain. For pain.     diclofenac 50 MG tablet  Commonly known as:  CATAFLAM  Take 1 tablet (50 mg total) by mouth 3 (three) times daily. As needed for pain.     HYDROcodone-acetaminophen 5-325 MG per tablet  Commonly known as:  NORCO/VICODIN  Take 1 tablet by mouth every 6 (six) hours as  needed for pain.     ibuprofen 800 MG tablet  Commonly known as:  ADVIL,MOTRIN  Take 1 tablet (800 mg total) by mouth every 8 (eight) hours as needed for pain.     TRAMADOL HCL PO  Take 1 tablet by mouth daily as needed (pain).        Discharge Instructions: Please refer to Patient Instructions section of EMR for full details.  Patient was counseled important signs and symptoms that should prompt return to medical care, changes in medications, dietary instructions, activity restrictions, and follow up appointments.   Follow-Up Appointments: Follow-up Information   Follow up with Maryjean Ka, MD On 08/18/2012.  (10:00 am )    Specialty:  Family Medicine   Contact information:   [address] [place] [address] [phone]       Clare Gandy, MD 08/17/2012, 9:56 PM PGY-1, [facility]

## 2012-08-16 NOTE — H&P (Signed)
Family Medicine Teaching [facility] Admission History and Physical Service Pager: [phone]  Patient name: Brandi Mendez Medical record number: [id number] Date of birth: 03-09-1955 Age: 57 y.o. Gender: female  Primary Care Provider: Kevin Fenton, MD Consultants: none Code Status: full   Chief Complaint: ab pain   Assessment and Plan: Brandi Mendez is a 57 y.o. year old female presenting with abdominal pain for 3 days . PMH is significant for small bowel obstruction, constipation, s/p abdominal hysterectomy, cholecystectomy and abdominal hernia repair.   # Abdominal pain with possible SBO: history of multiple abdominal surgery wwith previous admission for obstruction, most recently in April 2013. Never required surgery but had NG tube placed.  + flatus, Loose stool early 8/12. Has no had any emesis but forced herself to vomit to help ease pain. Has been seen in clinic in May and June for similar abdominal pain symptoms. Has been advised to take Miralax and has been prescribed tramadol previously for pain.  - CT showed no acute findings so do no suspect obstruction - NPO  - Fluids  - low threshold for consulting surgery  - NG tube if she has emesis   - morphine 2 mg q 4 hrs PRN - zofran for nausea  [ ]  f/u ab pain  [ ]  f/u nausea, advance diet as tolerated   FEN/GI: NPO, 100 NS mL/hr  Prophylaxis: SubQ heparin  Disposition: admitted to teaching service to rule out acute abdomen   History of Present Illness: Brandi Mendez is a 57 y.o. year old female presenting with 3 day history of abdominal pain. The pain is generalized across her abdomen. She feels as if her abdomen is tightening up. She had a loose stool the morning of 8/12. The pain comes and goes and feels sharp in nature. It felt worse last night. She forced herself to vomit last night, which provided relief to her abdominal pain. She hasn't eaten anything since Monday. She has + flatus. She has not had any sick contacts.    Review Of Systems: Per HPI with the following additions: none Otherwise 12 point review of systems was performed and was unremarkable.  Patient Active Problem List   Diagnosis Date Noted  . Periauricular adenopathy 07/28/2012  . Dyspepsia 06/01/2012  . UTI (lower urinary tract infection) 02/04/2012  . Vaginal discharge 09/19/2010  . VAGINITIS 08/19/2009  . HEARTBURN 08/19/2009  . BREAST PAIN, LEFT 04/16/2009  . SMALL BOWEL OBSTRUCTION 03/25/2009  . CONSTIPATION 01/13/2009  . HYPERTRIGLYCERIDEMIA, MILD 12/26/2006  . OBESITY, UNSPECIFIED 12/26/2006   Past Medical History: History reviewed. No pertinent past medical history. Past Surgical History: Past Surgical History  Procedure Laterality Date  . Abdominal hysterectomy    . Cholecystectomy    . Abdominal hernia repair     Social History: History  Substance Use Topics  . Smoking status: Never Smoker   . Smokeless tobacco: Not on file  . Alcohol Use: Yes     Comment: sometimes   Additional social history: none Please also refer to relevant sections of EMR.  Family History: No family history on file. Allergies and Medications: No Known Allergies No current facility-administered medications on file prior to encounter.   Current Outpatient Prescriptions on File Prior to Encounter  Medication Sig Dispense Refill  . acetaminophen (TYLENOL) 500 MG tablet Take 500 mg by mouth every 6 (six) hours as needed for pain. For pain.      Marland Kitchen diclofenac (CATAFLAM) 50 MG tablet Take 1 tablet (50 mg  total) by mouth 3 (three) times daily. As needed for pain.  21 tablet  0  . fluconazole (DIFLUCAN) 150 MG tablet Take 1 tablet (150 mg total) by mouth once.  2 tablet  0  . ibuprofen (ADVIL,MOTRIN) 800 MG tablet Take 1 tablet (800 mg total) by mouth every 8 (eight) hours as needed for pain.  30 tablet  0  . [DISCONTINUED] omeprazole (PRILOSEC) 20 MG capsule Take 1 capsule (20 mg total) by mouth daily. for heartburn  30 capsule  0     Objective: BP 101/75  Pulse 50  Temp(Src) 97.9 F (36.6 C) (Oral)  Resp 18  Ht 5\' 4"  (1.626 m)  Wt 172 lb 13.5 oz (78.4 kg)  BMI 29.65 kg/m2  SpO2 98% Exam: Physical Exam Gen: NAD, alert, cooperative with exam HEENT: NCAT, MMM, nares patent  CV: RRR, good S1/S2, no murmur Resp: CTABL, no wheezes, non-labored Abd: Soft, BS present, no guarding or organomegaly, mild distention and mild tenderness in epigastric area No single point of pain. Vertical midline scar and horizontal scar near umbilicus from prior surgeries.  Ext: No edema, warm Neuro: Alert and oriented, No gross deficits  Labs and Imaging: CBC BMET   Recent Labs Lab 08/15/12 1654  WBC 10.6*  HGB 14.5  HCT 40.0  PLT 303    Recent Labs Lab 08/15/12 1654  NA 141  K 3.6  CL 100  CO2 27  BUN 16  CREATININE 0.79  GLUCOSE 99  CALCIUM 10.1     Urinalysis    Component Value Date/Time   COLORURINE AMBER* 08/15/2012 1856   APPEARANCEUR CLOUDY* 08/15/2012 1856   LABSPEC 1.028 08/15/2012 1856   PHURINE 5.0 08/15/2012 1856   GLUCOSEU NEGATIVE 08/15/2012 1856   HGBUR SMALL* 08/15/2012 1856   HGBUR moderate 06/07/2008 1338   BILIRUBINUR MODERATE* 08/15/2012 1856   BILIRUBINUR NEG 06/01/2012 1440   KETONESUR 15* 08/15/2012 1856   PROTEINUR NEGATIVE 08/15/2012 1856   UROBILINOGEN 1.0 08/15/2012 1856   UROBILINOGEN 1.0 06/01/2012 1440   NITRITE NEGATIVE 08/15/2012 1856   NITRITE NEG 06/01/2012 1440   LEUKOCYTESUR SMALL* 08/15/2012 1856     CT ABDOMEN AND PELVIS WITH CONTRAST  Comparison: CT of the abdomen and pelvis 04/24/2011.  IMPRESSION:  1. No acute findings in the abdomen or pelvis to account for  patient's symptoms.  2. Status post cholecystectomy, hysterectomy of ventral hernia  repair.  3. Mild anterior abdominal wall laxity and focal small ventral  hernia along the inferior margin of the repair site, without  evidence of incarceration or obstruction at this time.  4. Additional incidental findings, as  above.   Clare Gandy, MD 08/16/2012, 2:36 AM PGY-1, [facility] FPTS Intern pager: [phone], text pages welcome   I have examined with patient with Dr. Jordan Likes and made changes to the note above. The patient has a very mild presentation, but needs evaluation given her history of SBO. We will slowly advance her diet as tolerated. If she is not improved in 48 hours, then consider general surgery consultation.   Si Raider Clinton Sawyer, MD, [facility] 08/16/2012, 4:56 AM Family Medicine Resident, PGY-3

## 2012-08-16 NOTE — Progress Notes (Signed)
PGY-2 Update Note  Pt reassessed. Still having crampy abdominal pain, passing flatus. Tolerating clear liquid diet, but not feeling well enough to go home. Still requiring morphine. Discussed that CT shows no obstruction and that narcotics may be contributing to possible ileus.   Reduced frequency of morphine and added Miralax daily. Will allow clear liquid diet to continue and advance as tolerated, then reassess overnight for potential discharge tomorrow.  Bobbye Morton, MD PGY-2, [facility] 08/16/2012, 4:52 PM FPTS Service pager: [phone] (text pages welcome through [facility])

## 2012-08-16 NOTE — Progress Notes (Signed)
Family Medicine Teaching Service Daily Progress Note Intern Pager: [phone]  Patient name: Brandi Mendez Medical record number: [id number] Date of birth: [birth year] Age: 57 y.o. Gender: female  Primary Care Provider: Kevin Fenton, MD Consultants: none Code Status: Full  Pt Overview and Major Events to Date: 8/13 - CT: no acute findings  Assessment and Plan: Brandi Mendez is a 57 y.o. year old female presenting with abdominal pain for 57 years . PMH is significant for small bowel obstruction, constipation, s/p abdominal hysterectomy, cholecystectomy and abdominal hernia repair.   # Abdominal pain with possible SBO: history of multiple abdominal surgery wwith previous admission for obstruction, most recently in April 2013. Never required surgery but had NG tube placed. + flatus, Loose stool early 8/12. Has no had any emesis but forced herself to vomit to help ease pain. Has been seen in clinic in May and June for similar abdominal pain symptoms. Has been advised to take Miralax and has been prescribed tramadol previously for pain.  - CT showed no acute findings so do no suspect obstruction  - NPO  - Fluids  - low threshold for consulting surgery  - NG tube if she has emesis  - morphine 2 mg q 4 hrs PRN x1  - zofran for nausea  [ ]  f/u ab pain  [ ]  f/u nausea, advance diet as tolerated   FEN/GI: NPO, 100 NS mL/hr  Prophylaxis: SubQ heparin   Disposition: pending improvement   Subjective: Patient is feeling about the same. She is still having flatus but has not had a BM. No overnight events.   Objective: Temp:  [97.8 F (36.6 C)-98.1 F (36.7 C)] 97.8 F (36.6 C) (08/13 0523) Pulse Rate:  [50-75] 50 (08/13 0523) Resp:  [16-22] 18 (08/13 0523) BP: (90-124)/(58-80) 90/60 mmHg (08/13 0523) SpO2:  [96 %-100 %] 96 % (08/13 0523) Weight:  [172 lb 13.5 oz (78.4 kg)] 172 lb 13.5 oz (78.4 kg) (08/12 2147) Physical Exam: Gen: NAD, alert, cooperative with exam  HEENT: NCAT, MMM, nares  patent  CV: RRR, good S1/S2, no murmur  Resp: CTABL, no wheezes, non-labored  Abd: LLQ, LUQ tenderness, Soft, BS present, no guarding or organomegaly, No single point of pain. Vertical midline scar and horizontal scar near umbilicus from prior surgeries.  Ext: No edema, warm  Neuro: Alert and oriented, No gross deficits   Laboratory:  Recent Labs Lab 08/15/12 1654  WBC 10.6*  HGB 14.5  HCT 40.0  PLT 303    Recent Labs Lab 08/15/12 1654  NA 141  K 3.6  CL 100  CO2 27  BUN 16  CREATININE 0.79  CALCIUM 10.1  PROT 8.1  BILITOT 1.9*  ALKPHOS 79  ALT 15  AST 17  GLUCOSE 99      Imaging/Diagnostic Tests: CT ABDOMEN AND PELVIS WITH CONTRAST  Comparison: CT of the abdomen and pelvis 04/24/2011.  IMPRESSION:  1. No acute findings in the abdomen or pelvis to account for  patient's symptoms.  2. Status post cholecystectomy, hysterectomy of ventral hernia  repair.  3. Mild anterior abdominal wall laxity and focal small ventral  hernia along the inferior margin of the repair site, without  evidence of incarceration or obstruction at this time.  4. Additional incidental findings, as above.   Clare Gandy, MD 08/16/2012, 9:34 AM PGY-1, [facility] FPTS Intern pager: [phone], text pages welcome

## 2012-08-16 NOTE — H&P (Signed)
FMTS Attending Admission Note: Brandi Bromwell,MD I  have seen and examined this patient, reviewed their chart. I have discussed this patient with the resident. I agree with the resident's findings, assessment and care plan.  57 Y/O female with PMX of abdominal surgery ( Hysterectomy) with multiple episodes of abdominal pain requiring admission for possible SBO presented with abdominal pain,nausea for about 4 days progressively worsening, denies diarrhea or constipation. Since admission,she feels better.  Exam: Filed Vitals:   08/15/12 2100 08/15/12 2115 08/15/12 2147 08/16/12 0523  BP: 100/68 94/58 101/75 90/60  Pulse: 71 75 50 50  Temp:   97.9 F (36.6 C) 97.8 F (36.6 C)  TempSrc:   Oral Oral  Resp:   18 18  Height:   5\' 4"  (1.626 m)   Weight:   172 lb 13.5 oz (78.4 kg)   SpO2: 100% 98% 98% 96%   Gen: Comfortable in bed, not in distress. HEENT: PERRLA, EOMI Heart: S1 S2,normal no murmurs. Resp: Air entry equal B/L. Abd: Soft,ND,mild generalized tenderness,no rebound tenderness. BS normoactive. Ext: No edema.  A/P: 57 y/o female with acute abdominal pain.        CT abdomen reviewed not suggestive of SBO.         I agree with IV hydration and pain control.         Advance diet as tolerated.

## 2012-08-17 DIAGNOSIS — R109 Unspecified abdominal pain: Secondary | ICD-10-CM

## 2012-08-17 LAB — CBC
MCH: 32.6 pg (ref 26.0–34.0)
MCHC: 35.6 g/dL (ref 30.0–36.0)
MCV: 91.5 fL (ref 78.0–100.0)
Platelets: 264 10*3/uL (ref 150–400)
RDW: 12.6 % (ref 11.5–15.5)
WBC: 8.7 10*3/uL (ref 4.0–10.5)

## 2012-08-17 LAB — URINE CULTURE

## 2012-08-17 MED ORDER — HYDROCODONE-ACETAMINOPHEN 5-325 MG PO TABS: 1.0000 | ORAL_TABLET | Freq: Four times a day (QID) | ORAL | Status: DC | PRN

## 2012-08-17 NOTE — Progress Notes (Signed)
I discussed with  Dr Schmitz.  I agree with their plans documented in their progress note for today. 

## 2012-08-17 NOTE — Progress Notes (Signed)
UR completed 

## 2012-08-17 NOTE — Progress Notes (Signed)
Family Medicine Teaching Service Daily Progress Note Intern Pager: [phone]  Patient name: Brandi Mendez Medical record number: [id number] Date of birth: Sep 16, 1955 Age: 57 y.o. Gender: female  Primary Care Provider: Kevin Fenton, MD Consultants: none  Code Status: Full   Pt Overview and Major Events to Date:  8/13 - CT: no acute findings  Assessment and Plan: Brandi Mendez is a 57 y.o. year old female presenting with abdominal pain for 3 days . PMH is significant for small bowel obstruction, constipation, s/p abdominal hysterectomy, cholecystectomy and abdominal hernia repair.   # Abdominal pain: history of multiple abdominal surgery wwith previous admission for obstruction, most recently in April 2013. Never required surgery but had NG tube placed. + flatus, Loose stool early 8/12. Has no had any emesis but forced herself to vomit to help ease pain. Has been seen in clinic in May and June for similar abdominal pain symptoms. Has been advised to take Miralax and has been prescribed tramadol previously for pain.  - CT showed no acute findings so do no suspect obstruction  - clear liquid diet  - Miralax 17 g  - had two bowel movements last night but still complains of LLQ LUQ tenderness on palpation  - morphine 2 mg q 6 hrs PRN x1  - zofran for nausea   FEN/GI: NPO, 100 NS mL/hr  Prophylaxis: SubQ heparin  Disposition: home pending improvement   Subjective: Patient had no overnight events. She had two bowel movements but still complaining of tenerness and nausesa. She is willing to go home. She does not have a GI doctor.   Objective: Temp:  [97.6 F (36.4 C)-98.2 F (36.8 C)] 98.2 F (36.8 C) (08/14 0607) Pulse Rate:  [50-80] 80 (08/14 0607) Resp:  [16-18] 18 (08/14 0607) BP: (92-109)/(55-73) 109/73 mmHg (08/14 0607) SpO2:  [96 %-100 %] 98 % (08/14 1478) Physical Exam: Gen: NAD, alert, cooperative with exam  HEENT: NCAT, MMM, nares patent  CV: RRR, good S1/S2, no murmur   Resp: CTABL, no wheezes, non-labored  Abd: LLQ, LUQ tenderness, Soft, BS present, no guarding or organomegaly, \  Ext: No edema, warm  Neuro: Alert and oriented, No gross deficits   Laboratory:  Recent Labs Lab 08/15/12 1654  WBC 10.6*  HGB 14.5  HCT 40.0  PLT 303    Recent Labs Lab 08/15/12 1654 08/16/12 1100  NA 141 140  K 3.6 3.5  CL 100 106  CO2 27 24  BUN 16 16  CREATININE 0.79 0.74  CALCIUM 10.1 8.9  PROT 8.1  --   BILITOT 1.9*  --   ALKPHOS 79  --   ALT 15  --   AST 17  --   GLUCOSE 99 87    Imaging/Diagnostic Tests: CT ABDOMEN AND PELVIS WITH CONTRAST  Comparison: CT of the abdomen and pelvis 04/24/2011.  IMPRESSION:  1. No acute findings in the abdomen or pelvis to account for  patient's symptoms.  2. Status post cholecystectomy, hysterectomy of ventral hernia  repair.  3. Mild anterior abdominal wall laxity and focal small ventral  hernia along the inferior margin of the repair site, without  evidence of incarceration or obstruction at this time.  4. Additional incidental findings, as above.   Clare Gandy, MD 08/17/2012, 9:33 AM PGY-1,  Family Medicine FPTS Intern pager: [phone], text pages welcome

## 2012-08-17 NOTE — Progress Notes (Signed)
PCP note  I have seen Ms. Arruda today and have discussed her case with her. She is in good spirits and nearing being ready for dc home. I appreciate and agree with the family medicine teaching service's excellent care. I look forward to seeing Ms. Beaston in clinic for follow up.  [name], MD [facility] Family Medicine Resident, PGY-2 08/17/2012, 1:11 PM

## 2012-08-18 ENCOUNTER — Inpatient Hospital Stay: Payer: Self-pay | Admitting: Family Medicine

## 2012-08-18 ENCOUNTER — Encounter (HOSPITAL_COMMUNITY): Payer: Self-pay | Admitting: Emergency Medicine

## 2012-08-18 ENCOUNTER — Emergency Department (HOSPITAL_COMMUNITY)
Admission: EM | Admit: 2012-08-18 | Discharge: 2012-08-18 | Disposition: A | Discharge status: Home / Self Care | Payer: Medicaid Other | Attending: Emergency Medicine | Admitting: Emergency Medicine

## 2012-08-18 DIAGNOSIS — Z8774 Personal history of (corrected) congenital malformations of heart and circulatory system: Secondary | ICD-10-CM | POA: Insufficient documentation

## 2012-08-18 DIAGNOSIS — H6980 Other specified disorders of Eustachian tube, unspecified ear: Secondary | ICD-10-CM | POA: Insufficient documentation

## 2012-08-18 DIAGNOSIS — H9190 Unspecified hearing loss, unspecified ear: Secondary | ICD-10-CM | POA: Insufficient documentation

## 2012-08-18 DIAGNOSIS — Z87448 Personal history of other diseases of urinary system: Secondary | ICD-10-CM | POA: Insufficient documentation

## 2012-08-18 DIAGNOSIS — I10 Essential (primary) hypertension: Secondary | ICD-10-CM | POA: Insufficient documentation

## 2012-08-18 DIAGNOSIS — M549 Dorsalgia, unspecified: Secondary | ICD-10-CM | POA: Insufficient documentation

## 2012-08-18 DIAGNOSIS — H6990 Unspecified Eustachian tube disorder, unspecified ear: Secondary | ICD-10-CM | POA: Insufficient documentation

## 2012-08-18 DIAGNOSIS — E119 Type 2 diabetes mellitus without complications: Secondary | ICD-10-CM | POA: Insufficient documentation

## 2012-08-18 DIAGNOSIS — Z8669 Personal history of other diseases of the nervous system and sense organs: Secondary | ICD-10-CM | POA: Insufficient documentation

## 2012-08-18 DIAGNOSIS — J029 Acute pharyngitis, unspecified: Secondary | ICD-10-CM | POA: Insufficient documentation

## 2012-08-18 DIAGNOSIS — R05 Cough: Secondary | ICD-10-CM | POA: Insufficient documentation

## 2012-08-18 DIAGNOSIS — R059 Cough, unspecified: Secondary | ICD-10-CM | POA: Insufficient documentation

## 2012-08-18 DIAGNOSIS — J3489 Other specified disorders of nose and nasal sinuses: Secondary | ICD-10-CM | POA: Insufficient documentation

## 2012-08-18 DIAGNOSIS — Z880 Allergy status to penicillin: Secondary | ICD-10-CM | POA: Insufficient documentation

## 2012-08-18 DIAGNOSIS — Z79899 Other long term (current) drug therapy: Secondary | ICD-10-CM | POA: Insufficient documentation

## 2012-08-18 DIAGNOSIS — H6983 Other specified disorders of Eustachian tube, bilateral: Secondary | ICD-10-CM

## 2012-08-18 HISTORY — DX: Unspecified hearing loss, unspecified ear: H91.90

## 2012-08-18 HISTORY — DX: Aphasia: R47.01

## 2012-08-18 MED ORDER — ANTIPYRINE-BENZOCAINE 5.4-1.4 % OT SOLN: 3.0000 [drp] | OTIC | Status: DC | PRN

## 2012-08-18 MED ORDER — ACETAMINOPHEN 500 MG PO TABS
1000.0000 mg | ORAL_TABLET | Freq: Once | ORAL | Status: AC
  Administered 2012-08-18: 1000 mg via ORAL
  Filled 2012-08-18: qty 2

## 2012-08-18 NOTE — ED Provider Notes (Signed)
CSN: [id number]     Arrival date & time 08/18/12  0026 History     None    Chief Complaint  Patient presents with  . Foreign Body in Ear   (Consider location/radiation/quality/duration/timing/severity/associated sxs/prior Treatment) HPI History provided by pt and her granddaughter who is translating.  Pt has had bilateral otalgia that radiates into her neck since yesterday.  She is concerned that she got cotton from a qtip lodged in her L ear.  Associated w/ nasal congestion, sinus pressure, sore throat and mild cough.  No known fever and denies otorrhea.  Has not taken anything for pain.   Past Medical History  Diagnosis Date  . Diabetes mellitus   . Hypertension   . Back pain   . Chronic PID   . Ventricular septal defect 2004 per echo  . [place]   . Deaf    Past Surgical History  Procedure Laterality Date  . Cholecystectomy  11/04/1993  . Tubal ligation  10/05/1978  . Colposcopy     No family history on file. History  Substance Use Topics  . Smoking status: Never Smoker   . Smokeless tobacco: Not on file  . Alcohol Use: No   OB History   Grav Para Term Preterm Abortions TAB SAB Ect Mult Living                 Review of Systems  All other systems reviewed and are negative.    Allergies  Codeine and Penicillins  Home Medications   Current Outpatient Rx  Name  Route  Sig  Dispense  Refill  . amLODipine-valsartan (EXFORGE) 5-320 MG per tablet   Oral   Take 1 tablet by mouth daily.   30 tablet   6   . hydrochlorothiazide (HYDRODIURIL) 25 MG tablet   Oral   Take 1 tablet (25 mg total) by mouth daily.   30 tablet   6   . ibuprofen (ADVIL,MOTRIN) 600 MG tablet   Oral   Take 1 tablet (600 mg total) by mouth every 8 (eight) hours as needed for pain.   30 tablet   0   . metFORMIN (GLUCOPHAGE) 1000 MG tablet   Oral   Take 1 tablet (1,000 mg total) by mouth 2 (two) times daily.   60 tablet   11   . metoprolol succinate (TOPROL-XL) 25 MG 24 hr tablet  Oral   Take 1 tablet (25 mg total) by mouth daily.   30 tablet   6   . pantoprazole (PROTONIX) 40 MG tablet   Oral   Take 1 tablet (40 mg total) by mouth daily.   30 tablet   6   . pravastatin (PRAVACHOL) 40 MG tablet   Oral   Take 1 tablet (40 mg total) by mouth daily.   30 tablet   6   . traMADol (ULTRAM) 50 MG tablet   Oral   Take 1 tablet (50 mg total) by mouth every 6 (six) hours as needed for pain.   15 tablet   0   . antipyrine-benzocaine (AURALGAN) otic solution   Both Ears   Place 3 drops into both ears every 2 (two) hours as needed for pain.   10 mL   0   . BD ULTRA-FINE LANCETS lancets      To test blood glucose daily          . Blood Glucose Monitoring Suppl (FREESTYLE LITE) DEVI      Check blood glucose once daily  before breakfast or as directed          . glucose blood (FREESTYLE LITE) test strip      To test blood sugar one time daily           BP 132/89  Pulse 59  Temp(Src) 98.1 F (36.7 C) (Oral)  Resp 14  SpO2 96% Physical Exam  Nursing note and vitals reviewed. Constitutional: She is oriented to person, place, and time. She appears well-developed and well-nourished. No distress.  HENT:  Head: Normocephalic and atraumatic.  No light reflex on right TM.  TM and EAC otherwise nml bilaterally; no foreign body.  No erythema/edema of mastoids.  Tenderness over eustachian tube bilaterally.  Tenderness frontal and maxillary sinuses bilaterally.  Posterior pharynx w/out erythema.  No tonsillar edema or exudate.  Uvula mid-line.  No trismus.    Eyes: Conjunctivae are normal.  Normal appearance  Neck: Normal range of motion.  Cardiovascular: Normal rate and regular rhythm.   Pulmonary/Chest: Effort normal and breath sounds normal. No respiratory distress.  Musculoskeletal: Normal range of motion.  Lymphadenopathy:    She has no cervical adenopathy.  Neurological: She is alert and oriented to person, place, and time.  Skin: Skin is warm and  dry. No rash noted.  Psychiatric: She has a normal mood and affect. Her behavior is normal.    ED Course   Procedures (including critical care time)  Labs Reviewed - No data to display No results found. 1. Eustachian tube dysfunction, bilateral     MDM  57yo F presents w/ bilateral otalgia + sinus/nasal congestion, sore throat and cough.  Concerned she has cotton from qtip in L ear as well.  No foreign body in either ear.  History and exam most consistent w/ eustachian tube dysfunction secondary to viral respiratory infection.  Discussed with patient.  Recommended coricidin and tylenol and prescribed auralgan to trial for ear pain.  Referred to primary care.  Return precautions discussed.   Otilio Miu, PA-C 08/18/12 [phone]

## 2012-08-18 NOTE — ED Provider Notes (Signed)
Medical screening examination/treatment/procedure(s) were performed by non-physician practitioner and as supervising physician I was immediately available for consultation/collaboration.   Glynn Octave, MD 08/18/12 [phone]

## 2012-08-18 NOTE — ED Notes (Signed)
PT. ARRIVED WITH EMS FROM HOME , PT. CLEANING EAR WITH COTTON SWABS THIS EVENING , REPORTS PAIN AT LEFT EAR . NO DRAINAGE , GRAND DAUGHTER TRANSLATING ( SIGN LANGUAGE) FOR PT. PT. IS DEAF/MUTE .

## 2012-08-18 NOTE — Discharge Summary (Signed)
I discussed with  Dr Schmitz.  I agree with their plans documented in their progress note for today. 

## 2012-08-24 ENCOUNTER — Telehealth: Payer: Self-pay | Admitting: Family Medicine

## 2012-08-24 NOTE — Telephone Encounter (Signed)
Please advise- Elizabeth Trayvion Embleton, RN-BSN  

## 2012-08-24 NOTE — Telephone Encounter (Signed)
Called to discuss abd pain, left VM asking her to call back. Pt with recent partial SBO and may need to be seen today at the ED vs UC. Will attempt call again.   [name], MD [facility] Family Medicine Resident, PGY-2 08/24/2012, 12:59 PM

## 2012-08-24 NOTE — Telephone Encounter (Signed)
Pt wants dr Ermalinda Memos to call. Still having trouble with stomach-still cramping Please advise

## 2012-08-25 ENCOUNTER — Ambulatory Visit: Payer: Self-pay | Admitting: Family Medicine

## 2012-08-25 NOTE — Telephone Encounter (Signed)
Saw Brandi Mendez later in the day when she accompanied her niece to clinic, She reports two day s of persistent diarrhea and abd cramping, approx 5 episodes of diarrhea daily. She states that she is not having nausea or vomiting with eating, and is otherwise tolerating food well. I discussed the very real possibility that she could develop an SBO given her history and discussed red flags for her to seek care. At this point she feels she does not need to be sen in the ED, She does have an appt with me today for hospital follow up.   [name], MD [facility] Family Medicine Resident, PGY-2 08/25/2012, 9:41 AM

## 2012-09-15 ENCOUNTER — Emergency Department (HOSPITAL_COMMUNITY)
Admission: EM | Admit: 2012-09-15 | Discharge: 2012-09-16 | Disposition: A | Discharge status: Home / Self Care | Payer: Self-pay | Attending: Emergency Medicine | Admitting: Emergency Medicine

## 2012-09-15 ENCOUNTER — Encounter (HOSPITAL_COMMUNITY): Payer: Self-pay | Admitting: Emergency Medicine

## 2012-09-15 DIAGNOSIS — R300 Dysuria: Secondary | ICD-10-CM | POA: Insufficient documentation

## 2012-09-15 DIAGNOSIS — N390 Urinary tract infection, site not specified: Secondary | ICD-10-CM | POA: Insufficient documentation

## 2012-09-15 DIAGNOSIS — R350 Frequency of micturition: Secondary | ICD-10-CM | POA: Insufficient documentation

## 2012-09-15 DIAGNOSIS — R110 Nausea: Secondary | ICD-10-CM | POA: Insufficient documentation

## 2012-09-15 DIAGNOSIS — Z8719 Personal history of other diseases of the digestive system: Secondary | ICD-10-CM | POA: Insufficient documentation

## 2012-09-15 LAB — URINALYSIS, ROUTINE W REFLEX MICROSCOPIC
Glucose, UA: NEGATIVE mg/dL
Ketones, ur: 15 mg/dL — AB
Specific Gravity, Urine: 1.025 (ref 1.005–1.030)
pH: 5 (ref 5.0–8.0)

## 2012-09-15 LAB — COMPREHENSIVE METABOLIC PANEL
AST: 16 U/L (ref 0–37)
Albumin: 4.1 g/dL (ref 3.5–5.2)
Alkaline Phosphatase: 66 U/L (ref 39–117)
Chloride: 105 mEq/L (ref 96–112)
Potassium: 3.2 mEq/L — ABNORMAL LOW (ref 3.5–5.1)
Total Bilirubin: 1.3 mg/dL — ABNORMAL HIGH (ref 0.3–1.2)
Total Protein: 7.4 g/dL (ref 6.0–8.3)

## 2012-09-15 LAB — CBC WITH DIFFERENTIAL/PLATELET
Basophils Absolute: 0 10*3/uL (ref 0.0–0.1)
Basophils Relative: 0 % (ref 0–1)
Hemoglobin: 12.6 g/dL (ref 12.0–15.0)
MCHC: 35.2 g/dL (ref 30.0–36.0)
Neutro Abs: 4.5 10*3/uL (ref 1.7–7.7)
Neutrophils Relative %: 62 % (ref 43–77)
Platelets: 246 10*3/uL (ref 150–400)
RDW: 12.8 % (ref 11.5–15.5)

## 2012-09-15 LAB — URINE MICROSCOPIC-ADD ON

## 2012-09-15 NOTE — ED Notes (Signed)
Pt states that she has had abd pain and nausea since Tuesday.She started getting diarrhea yesterday.

## 2012-09-15 NOTE — ED Notes (Signed)
C/o generalized abd cramping since Tuesday.  Took Tramadol without relief.  Denies nausea and vomiting.  Reports "loose bowels."

## 2012-09-16 MED ORDER — HYDROCODONE-ACETAMINOPHEN 5-325 MG PO TABS: 1.0000 | ORAL_TABLET | ORAL | Status: DC | PRN

## 2012-09-16 MED ORDER — CEPHALEXIN 250 MG PO CAPS
1000.0000 mg | ORAL_CAPSULE | Freq: Once | ORAL | Status: AC
  Administered 2012-09-16: 1000 mg via ORAL
  Filled 2012-09-16: qty 4

## 2012-09-16 MED ORDER — CEPHALEXIN 500 MG PO CAPS: 1000.0000 mg | ORAL_CAPSULE | Freq: Two times a day (BID) | ORAL | Status: DC

## 2012-09-16 NOTE — ED Provider Notes (Signed)
Medical screening examination/treatment/procedure(s) were performed by non-physician practitioner and as supervising physician I was immediately available for consultation/collaboration.  Darlys Gales, MD 09/16/12 [phone]

## 2012-09-16 NOTE — ED Provider Notes (Signed)
CSN: [id number]     Arrival date & time 09/15/12  1857 History   First MD Initiated Contact with Patient 09/15/12 2344     Chief Complaint  Patient presents with  . Abdominal Pain   (Consider location/radiation/quality/duration/timing/severity/associated sxs/prior Treatment) HPI History provided by pt.   Pt presents w/ intermittent, non-radiating, mid-line lower abdominal pain x 4 days.  Associated w/ nausea and a single episode of diarrhea, as well as dysuria and increased urinary frequency.  Denies fever, CP, SOB, hematochezia/melena, vaginal sx.  She is able to pass gas.  Pain seems to be aggravated by eating and improved after having a BM today.  Per prior chart, h/o partial SBO in 04/2011.  Pt reports that current pain feels different.  Past abd surgeries include cholecystectomy, hernia repair and total hyst.    Past Medical History  Diagnosis Date  . Small bowel obstruction 2013   Past Surgical History  Procedure Laterality Date  . Abdominal hysterectomy    . Cholecystectomy    . Abdominal hernia repair     No family history on file. History  Substance Use Topics  . Smoking status: Never Smoker   . Smokeless tobacco: Not on file  . Alcohol Use: Yes     Comment: sometimes   OB History   Grav Para Term Preterm Abortions TAB SAB Ect Mult Living                 Review of Systems  All other systems reviewed and are negative.    Allergies  Review of patient's allergies indicates no known allergies.  Home Medications   Current Outpatient Rx  Name  Route  Sig  Dispense  Refill  . acetaminophen (TYLENOL) 500 MG tablet   Oral   Take 500 mg by mouth every 6 (six) hours as needed for pain. For pain.         . traMADol (ULTRAM) 50 MG tablet   Oral   Take 50 mg by mouth 2 (two) times daily as needed for pain. For pain         . cephALEXin (KEFLEX) 500 MG capsule   Oral   Take 2 capsules (1,000 mg total) by mouth 2 (two) times daily.   26 capsule   0   .  HYDROcodone-acetaminophen (NORCO/VICODIN) 5-325 MG per tablet   Oral   Take 1 tablet by mouth every 4 (four) hours as needed for pain.   12 tablet   0    BP 97/57  Pulse 49  Temp(Src) 98.3 F (36.8 C) (Oral)  Resp 14  Wt 164 lb 9.6 oz (74.662 kg)  BMI 28.24 kg/m2  SpO2 98% Physical Exam  Nursing note and vitals reviewed. Constitutional: She is oriented to person, place, and time. She appears well-developed and well-nourished. No distress.  HENT:  Head: Normocephalic and atraumatic.  Eyes:  Normal appearance  Neck: Normal range of motion.  Cardiovascular: Normal rate and regular rhythm.   Pulmonary/Chest: Effort normal and breath sounds normal. No respiratory distress.  Abdominal: Soft. Bowel sounds are normal. She exhibits no distension and no mass. There is no rebound and no guarding.  Mild tenderness mid-line lower abd only  Genitourinary:  No CVA tenderness  Musculoskeletal: Normal range of motion.  Neurological: She is alert and oriented to person, place, and time.  Skin: Skin is warm and dry. No rash noted.  Psychiatric: She has a normal mood and affect. Her behavior is normal.    ED Course  Procedures (including critical care time) Labs Review Labs Reviewed  CBC WITH DIFFERENTIAL - Abnormal; Notable for the following:    HCT 35.8 (*)    All other components within normal limits  COMPREHENSIVE METABOLIC PANEL - Abnormal; Notable for the following:    Potassium 3.2 (*)    Total Bilirubin 1.3 (*)    All other components within normal limits  URINALYSIS, ROUTINE W REFLEX MICROSCOPIC - Abnormal; Notable for the following:    Color, Urine RED (*)    APPearance CLOUDY (*)    Hgb urine dipstick MODERATE (*)    Bilirubin Urine LARGE (*)    Ketones, ur 15 (*)    Urobilinogen, UA 2.0 (*)    Nitrite POSITIVE (*)    Leukocytes, UA LARGE (*)    All other components within normal limits  URINE MICROSCOPIC-ADD ON - Abnormal; Notable for the following:    Squamous  Epithelial / LPF MANY (*)    Bacteria, UA FEW (*)    Crystals CA OXALATE CRYSTALS (*)    All other components within normal limits  URINE CULTURE  LIPASE, BLOOD   Imaging Review No results found.  MDM   1. UTI (lower urinary tract infection)    57yo F w/ h/o SBO, otherwise healthy, presents w/ lower abd pain and dysuria.  Has also had nausea and single episode of diarrhea.  On exam, afebrile, non-toxic and comfortable appearing, abd soft/non-distended, tenderness localized to mid-line lower abd.  Pain reportedly different and exam is not consistent w/ SBO.  U/A positive for infection and this is most likely etiology of sx.  Low suspicion for pyelo based on exam.  Pt received first dose of keflex in ED.  Strict return precautions discussed.      Otilio Miu, PA-C 09/16/12 [phone]

## 2012-09-17 LAB — URINE CULTURE

## 2012-09-20 ENCOUNTER — Ambulatory Visit (INDEPENDENT_AMBULATORY_CARE_PROVIDER_SITE_OTHER): Payer: Medicaid Other | Admitting: Internal Medicine

## 2012-09-20 ENCOUNTER — Encounter: Payer: Self-pay | Admitting: Internal Medicine

## 2012-09-20 VITALS — BP 136/82 | HR 86 | Temp 97.1°F | Ht 65.0 in | Wt 192.5 lb

## 2012-09-20 DIAGNOSIS — Z0000 Encounter for general adult medical examination without abnormal findings: Secondary | ICD-10-CM

## 2012-09-20 DIAGNOSIS — Z23 Encounter for immunization: Secondary | ICD-10-CM

## 2012-09-20 DIAGNOSIS — R011 Cardiac murmur, unspecified: Secondary | ICD-10-CM

## 2012-09-20 DIAGNOSIS — I10 Essential (primary) hypertension: Secondary | ICD-10-CM

## 2012-09-20 DIAGNOSIS — E1129 Type 2 diabetes mellitus with other diabetic kidney complication: Secondary | ICD-10-CM

## 2012-09-20 DIAGNOSIS — E785 Hyperlipidemia, unspecified: Secondary | ICD-10-CM

## 2012-09-20 LAB — POCT GLYCOSYLATED HEMOGLOBIN (HGB A1C): Hemoglobin A1C: 6.5

## 2012-09-20 LAB — GLUCOSE, CAPILLARY: Glucose-Capillary: 162 mg/dL — ABNORMAL HIGH (ref 70–99)

## 2012-09-20 NOTE — Assessment & Plan Note (Addendum)
Assessment:  Well controlled, 136/82 today.  Patient compliant with Exforge and HCTZ and she has these bottles with her.  She says she was not aware she had been prescribed Toprol and does not have this medication at home.  Plan:    1) Continue current medications:  Exforge 5/320mg  daily and HCTZ 25mg  daily  2) Will check with [facility] to see if Toprol on their medication list   3) Return to clinic in 3 months for follow-up

## 2012-09-20 NOTE — Assessment & Plan Note (Signed)
Assessment:  Patient with VSD and murmur followed by Dr. Alanda Amass at [facility].  She says she is seen every six months.  No signs or symptoms of HF.  ECHO 04/14 at [facility] revealed PA peak pressure of and mitral valve area of 2.14cm^2.  Plan:  Patient provided with phone number to arrange appointment with [facility] since it is nearly six months since her last visit.

## 2012-09-20 NOTE — Patient Instructions (Addendum)
General Instructions: 1. Please return to clinic in 3 months for follow-up.   2. Please take all medications as prescribed.     3. If you have worsening of your symptoms or new symptoms arise, please call the clinic ([phone]), or go to the ER immediately if symptoms are severe.  Treatment Goals:  Goals (1 Years of Data) as of 09/20/12         As of Today 08/18/12 08/18/12 07/26/12 07/26/12     Blood Pressure    . Blood Pressure < 140/90  136/82 132/89 135/90 136/90 157/87     Result Component    . HEMOGLOBIN A1C < 7.0  6.5        . LDL CALC < 100            Progress Toward Treatment Goals:  Treatment Goal 09/20/2012  Hemoglobin A1C at goal  Blood pressure at goal    Self Care Goals & Plans:  Self Care Goal 09/20/2012  Manage my medications take my medicines as prescribed; bring my medications to every visit  Monitor my health keep track of my blood glucose; bring my glucose meter and log to each visit  Eat healthy foods drink diet soda or water instead of juice or soda; eat more vegetables; eat foods that are low in salt; eat baked foods instead of fried foods; eat fruit for snacks and desserts  Be physically active find an activity I enjoy  Meeting treatment goals maintain the current self-care plan    Home Blood Glucose Monitoring 09/20/2012  Check my blood sugar no home glucose monitoring  When to check my blood sugar -     Care Management & Community Referrals:  Referral 03/14/2012  Referrals made for care management support none needed

## 2012-09-20 NOTE — Progress Notes (Signed)
  Subjective:    Patient ID: Janice Massey, female    DOB: [birth year], 56 y.o.   MRN: [id number]  HPI Comments: Janice Massey is a 57 year old woman with a PMH of deafness, HTN, VSD, DM type 2, HLD and GERD.  She presents for follow-up and medication refill.  History is obtained via a sign language interpreter.  She says she is doing fine and is without complaint today.  Hgb A1c 6.5 (improved from 6.6 three months ago).  She is compliant with metformin 1000mg  BID.  She does not check blood sugars.  She denies polydipsia, peripheral neuropathy symptoms or change in vision.   Her BP is well controlled, 136/82 today.  She is compliant with Exforge 5/320mg  and HCTZ 25mg .  Records indicate that she is supposed to be on Toprol XL 25mg  but she says she is not familiar with the medication and does not remember it being prescribed.     Review of Systems  Constitutional: Negative for appetite change and fatigue.  Eyes: Negative for visual disturbance.  Respiratory: Negative for shortness of breath.   Cardiovascular: Negative for chest pain.  Gastrointestinal: Negative for nausea, vomiting, diarrhea and constipation.  Endocrine: Negative for polydipsia and polyuria.  Genitourinary: Positive for frequency. Negative for dysuria.  Neurological: Negative for weakness, numbness and headaches.       Objective:   Physical Exam  Constitutional: She is oriented to person, place, and time. She appears well-developed and well-nourished.  HENT:  Head: Normocephalic and atraumatic.  Mouth/Throat: Oropharynx is clear and moist. No oropharyngeal exudate.  Eyes: EOM are normal. Pupils are equal, round, and reactive to light. No scleral icterus.  Neck: Neck supple.  Cardiovascular: Normal rate and regular rhythm.   Murmur heard. Pulmonary/Chest: Effort normal and breath sounds normal. No respiratory distress. She has no wheezes. She has no rales.  Abdominal: Soft. Bowel sounds are normal. She exhibits no distension.  There is no tenderness.  Musculoskeletal: She exhibits no edema and no tenderness.  Lymphadenopathy:    She has no cervical adenopathy.  Neurological: She is alert and oriented to person, place, and time. She exhibits normal muscle tone.  5/5 MMS upper and lower extremity  Skin: Skin is warm. She is not diaphoretic.  Psychiatric: She has a normal mood and affect. Her behavior is normal.          Assessment & Plan:  Please see problem based assessment and plan.

## 2012-09-20 NOTE — Assessment & Plan Note (Signed)
Assessment:  Hgb A1c 6.5, at goal.  Patient compliant with metformin.  Plan:    1) Continue metformin 1000mg  BID.  2) Return to clinic in 3 months for follow-up.

## 2012-09-20 NOTE — Assessment & Plan Note (Signed)
Health maintenance up to date

## 2012-09-20 NOTE — Assessment & Plan Note (Signed)
Patient compliant with pravastatin 40mg  daily.  Will check lipids today.

## 2012-09-21 LAB — LIPID PANEL
Cholesterol: 164 mg/dL (ref 0–200)
HDL: 64 mg/dL (ref >39)
LDL Cholesterol: 76 mg/dL (ref 0–99)
Triglycerides: 120 mg/dL (ref <150)

## 2012-09-21 LAB — BASIC METABOLIC PANEL WITH GFR
Calcium: 9.8 mg/dL (ref 8.4–10.5)
GFR, Est African American: >89 mL/min
GFR, Est Non African American: 81 mL/min
Glucose, Bld: 194 mg/dL — ABNORMAL HIGH (ref 70–99)
Potassium: 3.9 mEq/L (ref 3.5–5.3)
Sodium: 138 mEq/L (ref 135–145)

## 2012-09-21 LAB — CBC WITH DIFFERENTIAL/PLATELET
Basophils Absolute: 0 10*3/uL (ref 0.0–0.1)
Basophils Relative: 0 % (ref 0–1)
Eosinophils Absolute: 0.1 10*3/uL (ref 0.0–0.7)
Hemoglobin: 12.9 g/dL (ref 12.0–15.0)
MCH: 29.1 pg (ref 26.0–34.0)
MCHC: 33.6 g/dL (ref 30.0–36.0)
Monocytes Relative: 7 % (ref 3–12)
Neutro Abs: 4.3 10*3/uL (ref 1.7–7.7)
Neutrophils Relative %: 60 % (ref 43–77)
Platelets: 272 10*3/uL (ref 150–400)
RBC: 4.43 MIL/uL (ref 3.87–5.11)

## 2012-09-21 NOTE — Progress Notes (Signed)
I saw and evaluated the patient.  I personally confirmed the key portions of the history and exam documented by Dr. Wilson and I reviewed pertinent patient test results.  The assessment, diagnosis, and plan were formulated together and I agree with the documentation in the resident's note. 

## 2012-09-26 ENCOUNTER — Telehealth: Payer: Self-pay | Admitting: *Deleted

## 2012-09-26 NOTE — Telephone Encounter (Signed)
RTC to person who called for pt.  Spoke with female who said that he would get a message to patient to call the Clinics.  Angelina Ok, RN 09/26/2012 12:14 PM

## 2012-09-27 ENCOUNTER — Other Ambulatory Visit: Payer: Self-pay | Admitting: *Deleted

## 2012-09-27 DIAGNOSIS — E119 Type 2 diabetes mellitus without complications: Secondary | ICD-10-CM

## 2012-10-05 ENCOUNTER — Ambulatory Visit (HOSPITAL_COMMUNITY)
Admission: RE | Admit: 2012-10-05 | Discharge: 2012-10-05 | Disposition: A | Discharge status: Home / Self Care | Payer: Self-pay | Source: Ambulatory Visit | Attending: Family Medicine | Admitting: Family Medicine

## 2012-10-05 ENCOUNTER — Ambulatory Visit (INDEPENDENT_AMBULATORY_CARE_PROVIDER_SITE_OTHER): Payer: Self-pay | Admitting: Family Medicine

## 2012-10-05 ENCOUNTER — Encounter: Payer: Self-pay | Admitting: Family Medicine

## 2012-10-05 VITALS — BP 86/61 | HR 114 | Temp 98.3°F | Ht 64.0 in | Wt 164.2 lb

## 2012-10-05 DIAGNOSIS — I951 Orthostatic hypotension: Secondary | ICD-10-CM

## 2012-10-05 DIAGNOSIS — R300 Dysuria: Secondary | ICD-10-CM

## 2012-10-05 DIAGNOSIS — I959 Hypotension, unspecified: Secondary | ICD-10-CM | POA: Insufficient documentation

## 2012-10-05 DIAGNOSIS — Z23 Encounter for immunization: Secondary | ICD-10-CM

## 2012-10-05 DIAGNOSIS — R079 Chest pain, unspecified: Secondary | ICD-10-CM

## 2012-10-05 DIAGNOSIS — R42 Dizziness and giddiness: Secondary | ICD-10-CM | POA: Insufficient documentation

## 2012-10-05 DIAGNOSIS — N390 Urinary tract infection, site not specified: Secondary | ICD-10-CM

## 2012-10-05 LAB — POCT URINALYSIS DIPSTICK
Ketones, UA: NEGATIVE
Nitrite, UA: NEGATIVE
Protein, UA: NEGATIVE
Urobilinogen, UA: 0.2
pH, UA: 5.5

## 2012-10-05 LAB — POCT UA - MICROSCOPIC ONLY

## 2012-10-05 MED ORDER — SULFAMETHOXAZOLE-TMP DS 800-160 MG PO TABS: 1.0000 | ORAL_TABLET | Freq: Two times a day (BID) | ORAL | Status: DC

## 2012-10-05 MED ORDER — NITROGLYCERIN 0.4 MG SL SUBL: 0.4000 mg | SUBLINGUAL_TABLET | SUBLINGUAL | Status: DC | PRN

## 2012-10-05 NOTE — Patient Instructions (Addendum)
It was great to see you today!  i have sent an antibiotic for your UTI  Orthostatic Hypotension Orthostatic hypotension is a sudden fall in blood pressure. It occurs when a person goes from a sitting or lying position to a standing position. CAUSES   Loss of body fluids (dehydration).  Medicines that lower blood pressure.  Sudden changes in posture, such as sudden standing when you have been sitting or lying down.  Taking too much of your medicine. SYMPTOMS   Lightheadedness or dizziness.  Fainting or near-fainting.  A fast heart rate (tachycardia).  Weakness.  Feeling tired (fatigue). DIAGNOSIS  Your caregiver may find the cause of orthostatic hypotension through:  A history and/or physical exam.  Checking your blood pressure. Your caregiver will check your blood pressure when you are:  Lying down.  Sitting.  Standing.  Tilt table testing. In this test, you are placed on a table that goes from a lying position to a standing position. You will be strapped to the table. This test helps to monitor your blood pressure and heart rate when you are in different positions. TREATMENT   If orthostatic hypotension is caused by your medicines, your caregiver will need to adjust your dosage. Do not stop or adjust your medicine on your own.  When changing positions, make these changes slowly. This allows your body to adjust to the different position.  Compression stockings that are worn on your lower legs may be helpful.  Your caregiver may have you consume extra salt. Do not add extra salt to your diet unless directed by your caregiver.  Eat frequent, small meals. Avoid sudden standing after eating.  Avoid hot showers or excessive heat.  Your caregiver may give you fluids through the vein (intravenous).  Your caregiver may put you on medicine to help enhance fluid retention. SEEK IMMEDIATE MEDICAL CARE IF:   You faint or have a near-fainting episode. Call your local  emergency services (911 in [place]  You have or develop chest pain.  You feel sick to your stomach (nauseous) or vomit.  You have a loss of feeling or movement in your arms or legs.  You have difficulty talking, slurred speech, or you are unable to talk.  You have difficulty thinking or have confused thinking. MAKE SURE YOU:   Understand these instructions.  Will watch your condition.  Will get help right away if you are not doing well or get worse. Document Released: 12/11/2001 Document Revised: 03/15/2011 Document Reviewed: 04/05/2008 [facility] Patient Information 2014 [place], [place].

## 2012-10-05 NOTE — Assessment & Plan Note (Signed)
With change in pulse from sitting to standing of 92 to 114, BP sitting to standing 102/71 to 86/61 Possibly dehydrated with UTI, discussed increasing fluids and caution per AVS.

## 2012-10-05 NOTE — Assessment & Plan Note (Signed)
Two previous this year in may and jan, treated with keflex then Will culture Treat with 5 ds bactrim DS

## 2012-10-05 NOTE — Progress Notes (Signed)
  Subjective:    Patient ID: Brandi Mendez, female    DOB: 07-18-1955, 57 y.o.   MRN: [id number]  HPI  Pt here with complaints of dizziness for 1 day and dysuria for 2-3 days  States that she has been dizzy for 2-3 days worse when she initially stands. She denies syncope or near syncope. She reports normal appetite and by mouth intake.   One day ago She had a 30 min episode of L sided squeezing chest pain that began while at rest and self resolved accompanied by dizziness.  She denies radiation of the pain, nausea, sweating, or weakness during this time. He states she's had chest pain like this before and hasn't happened in several months.  Also denies palpitations  She notes dysuria that resolved after last keflex has returned, no fevers chills, sweats, nasuea, or decreased appetite.   Review of Systems Per history of present illness    Objective:   Physical Exam  Gen: NAD, alert, cooperative with exam HEENT: NCAT CV: RRR, good S1/S2, no murmur Resp: CTABL, no wheezes, non-labored Abd: SNTND, BS present, no guarding or organomegaly Ext: No edema, warm Neuro: Alert and oriented, No gross deficits     Assessment & Plan:

## 2012-10-05 NOTE — Assessment & Plan Note (Addendum)
Atypical ASCVD risk 2% in 10 years Not reproducible on exam EKG without signs of ischemia.  Given that this was a single event and atypical in the sense that it lasted 30 min will watch and wait carefully.  Rx given for nitro, discussed use and to return right away if pain improves with nitro Discussed ACS and reasons to seek emergency care like worsening CP

## 2012-10-07 LAB — URINE CULTURE: Organism ID, Bacteria: NO GROWTH

## 2012-10-09 NOTE — Telephone Encounter (Signed)
closed

## 2012-10-18 ENCOUNTER — Emergency Department (HOSPITAL_COMMUNITY)
Admission: EM | Admit: 2012-10-18 | Discharge: 2012-10-19 | Disposition: A | Discharge status: Home / Self Care | Payer: Self-pay | Attending: Emergency Medicine | Admitting: Emergency Medicine

## 2012-10-18 ENCOUNTER — Emergency Department (HOSPITAL_COMMUNITY): Payer: Self-pay

## 2012-10-18 ENCOUNTER — Encounter (HOSPITAL_COMMUNITY): Payer: Self-pay | Admitting: Emergency Medicine

## 2012-10-18 DIAGNOSIS — R5381 Other malaise: Secondary | ICD-10-CM | POA: Insufficient documentation

## 2012-10-18 DIAGNOSIS — Z8719 Personal history of other diseases of the digestive system: Secondary | ICD-10-CM | POA: Insufficient documentation

## 2012-10-18 DIAGNOSIS — Z79899 Other long term (current) drug therapy: Secondary | ICD-10-CM | POA: Insufficient documentation

## 2012-10-18 DIAGNOSIS — R079 Chest pain, unspecified: Secondary | ICD-10-CM | POA: Insufficient documentation

## 2012-10-18 LAB — CBC
HCT: 33.3 % — ABNORMAL LOW (ref 36.0–46.0)
Hemoglobin: 11.4 g/dL — ABNORMAL LOW (ref 12.0–15.0)
MCV: 90.7 fL (ref 78.0–100.0)
RBC: 3.67 MIL/uL — ABNORMAL LOW (ref 3.87–5.11)
RDW: 13.2 % (ref 11.5–15.5)
WBC: 6.2 10*3/uL (ref 4.0–10.5)

## 2012-10-18 LAB — BASIC METABOLIC PANEL
BUN: 13 mg/dL (ref 6–23)
CO2: 23 mEq/L (ref 19–32)
Chloride: 107 mEq/L (ref 96–112)
Creatinine, Ser: 0.69 mg/dL (ref 0.50–1.10)
GFR calc Af Amer: >90 mL/min (ref >90)
Potassium: 3 mEq/L — ABNORMAL LOW (ref 3.5–5.1)

## 2012-10-18 MED ORDER — HYDROCODONE-ACETAMINOPHEN 5-325 MG PO TABS
1.0000 | ORAL_TABLET | Freq: Once | ORAL | Status: AC
  Administered 2012-10-18: 1 via ORAL
  Filled 2012-10-18: qty 1

## 2012-10-18 NOTE — ED Notes (Signed)
The pt has had lt sided chest pain with lt arm radiation  Since yesterday.  No sob n or v

## 2012-10-18 NOTE — ED Notes (Signed)
Pt came to [facility] desk to inquire as to the time.  Pt made aware that ER is still full and staff is working as fast as possible to get her back.

## 2012-10-18 NOTE — ED Provider Notes (Signed)
CSN: [id number]     Arrival date & time 10/18/12  1730 History   First MD Initiated Contact with Patient 10/18/12 2200     Chief Complaint  Patient presents with  . Chest Pain   (Consider location/radiation/quality/duration/timing/severity/associated sxs/prior Treatment) Patient is a 57 y.o. female presenting with chest pain. The history is provided by the patient. No language interpreter was used.  Chest Pain Pain location:  L chest Pain quality: aching   Pain radiates to:  Does not radiate Pain radiates to the back: no   Pain severity:  Mild Onset quality:  Gradual Timing:  Constant Associated symptoms: fatigue   Associated symptoms: no fever, no shortness of breath and no weakness   Associated symptoms comment:  Chest pain that started yesterday and has been constant in the left chest since that time. No SOB, N, V, cough or fever. She denies any history of heart disease or need to have ever seen a cardiologist in the past.    Past Medical History  Diagnosis Date  . Small bowel obstruction 2013   Past Surgical History  Procedure Laterality Date  . Abdominal hysterectomy    . Cholecystectomy    . Abdominal hernia repair     No family history on file. History  Substance Use Topics  . Smoking status: Never Smoker   . Smokeless tobacco: Not on file  . Alcohol Use: Yes     Comment: sometimes   OB History   Grav Para Term Preterm Abortions TAB SAB Ect Mult Living                 Review of Systems  Constitutional: Positive for fatigue. Negative for fever and chills.  HENT: Negative.   Respiratory: Negative.  Negative for shortness of breath.   Cardiovascular: Positive for chest pain.  Gastrointestinal: Negative.   Genitourinary: Negative.   Skin: Negative.   Neurological: Negative.  Negative for weakness and light-headedness.    Allergies  Review of patient's allergies indicates no known allergies.  Home Medications   Current Outpatient Rx  Name  Route  Sig   Dispense  Refill  . acetaminophen (TYLENOL) 500 MG tablet   Oral   Take 500 mg by mouth every 6 (six) hours as needed for pain. For pain.         . nitroGLYCERIN (NITROSTAT) 0.4 MG SL tablet   Sublingual   Place 1 tablet (0.4 mg total) under the tongue every 5 (five) minutes as needed for chest pain.   20 tablet   3   . sulfamethoxazole-trimethoprim (BACTRIM DS) 800-160 MG per tablet   Oral   Take 1 tablet by mouth 2 (two) times daily.   10 tablet   0   . traMADol (ULTRAM) 50 MG tablet   Oral   Take 50 mg by mouth 2 (two) times daily as needed for pain. For pain          BP 114/64  Pulse 45  Temp(Src) 99.1 F (37.3 C) (Oral)  Resp 15  SpO2 98% Physical Exam  Constitutional: She is oriented to person, place, and time. She appears well-developed and well-nourished.  HENT:  Head: Normocephalic.  Neck: Normal range of motion. Neck supple.  Cardiovascular: Normal rate and regular rhythm.   Pulmonary/Chest: Effort normal and breath sounds normal. She has no wheezes. She has no rales. She exhibits no tenderness.  Abdominal: Soft. Bowel sounds are normal. There is no tenderness. There is no rebound and no guarding.  Musculoskeletal: Normal range of motion. She exhibits no edema.  Neurological: She is alert and oriented to person, place, and time.  Skin: Skin is warm and dry. No rash noted.  Psychiatric: She has a normal mood and affect.    ED Course  Procedures (including critical care time) Labs Review Labs Reviewed  CBC - Abnormal; Notable for the following:    RBC 3.67 (*)    Hemoglobin 11.4 (*)    HCT 33.3 (*)    All other components within normal limits  BASIC METABOLIC PANEL - Abnormal; Notable for the following:    Potassium 3.0 (*)    Glucose, Bld 107 (*)    All other components within normal limits  POCT I-STAT TROPONIN I   Results for orders placed during the hospital encounter of 10/18/12  CBC      Result Value Range   WBC 6.2  4.0 - 10.5 K/uL   RBC  3.67 (*) 3.87 - 5.11 MIL/uL   Hemoglobin 11.4 (*) 12.0 - 15.0 g/dL   HCT 45.4 (*) 09.8 - 11.9 %   MCV 90.7  78.0 - 100.0 fL   MCH 31.1  26.0 - 34.0 pg   MCHC 34.2  30.0 - 36.0 g/dL   RDW 14.7  82.9 - 56.2 %   Platelets 250  150 - 400 K/uL  BASIC METABOLIC PANEL      Result Value Range   Sodium 142  135 - 145 mEq/L   Potassium 3.0 (*) 3.5 - 5.1 mEq/L   Chloride 107  96 - 112 mEq/L   CO2 23  19 - 32 mEq/L   Glucose, Bld 107 (*) 70 - 99 mg/dL   BUN 13  6 - 23 mg/dL   Creatinine, Ser 1.30  0.50 - 1.10 mg/dL   Calcium 9.0  8.4 - 86.5 mg/dL   GFR calc non Af Amer >90  >90 mL/min   GFR calc Af Amer >90  >90 mL/min  POCT I-STAT TROPONIN I      Result Value Range   Troponin i, poc 0.00  0.00 - 0.08 ng/mL   Comment 3            No results found.  Imaging Review No results found.  EKG Interpretation     Ventricular Rate:  68 PR Interval:  164 QRS Duration: 102 QT Interval:  402 QTC Calculation: 427 R Axis:   73 Text Interpretation:  Normal sinus rhythm Normal ECG since last tracing no significant change            MDM  No diagnosis found. 1. Chest pain  Patient with chest pain that is atypical for ACS (constant, no worse with exertion, no SOB, N and no history). All labs, x-rays negative. VSS.  She has community follow up and is felt stable for discharge.    Arnoldo Hooker, PA-C 10/19/12 [phone]

## 2012-10-19 MED ORDER — TRAMADOL HCL 50 MG PO TABS: 50.0000 mg | ORAL_TABLET | Freq: Two times a day (BID) | ORAL | Status: DC | PRN

## 2012-10-19 NOTE — ED Provider Notes (Signed)
Medical screening examination/treatment/procedure(s) were performed by non-physician practitioner and as supervising physician I was immediately available for consultation/collaboration.  Flint Melter, MD 10/19/12 [phone]

## 2012-10-24 ENCOUNTER — Ambulatory Visit: Payer: Self-pay | Admitting: Family Medicine

## 2012-10-24 ENCOUNTER — Other Ambulatory Visit: Payer: Self-pay | Admitting: *Deleted

## 2012-10-24 DIAGNOSIS — E785 Hyperlipidemia, unspecified: Secondary | ICD-10-CM

## 2012-10-26 MED ORDER — PRAVASTATIN SODIUM 40 MG PO TABS: 40.0000 mg | ORAL_TABLET | Freq: Every day | ORAL | Status: DC

## 2012-10-30 ENCOUNTER — Telehealth: Payer: Self-pay | Admitting: *Deleted

## 2012-10-30 NOTE — Telephone Encounter (Signed)
Received PA request from pt's pharmacy for amlodipine-valsartan 5-320 mg (generic for Exforge 5/325).  Contacted [employer] at [phone] to initiate PA, but medicaid showed a paid claim already on acct dated 10/28/12.  Medicaid will pay for the Brand, but not the generic, no PA required, phone call complete.Janice Spittle Cassady10/27/201411:47 AM

## 2012-12-07 ENCOUNTER — Telehealth: Payer: Self-pay | Admitting: *Deleted

## 2012-12-07 NOTE — Telephone Encounter (Signed)
Call to [facility]  for Prior Authorization for Exforge 5/320 mg tablets at [phone].  Approved 12/07/2012- 12/02/2013.  Approval was called to [facility] at [phone].  Angelina Ok, RN 12/07/2012 10:00 AM.

## 2012-12-20 ENCOUNTER — Encounter: Payer: Self-pay | Admitting: Internal Medicine

## 2012-12-20 ENCOUNTER — Ambulatory Visit (INDEPENDENT_AMBULATORY_CARE_PROVIDER_SITE_OTHER): Payer: Medicaid Other | Admitting: Internal Medicine

## 2012-12-20 ENCOUNTER — Encounter: Payer: Medicaid Other | Admitting: Internal Medicine

## 2012-12-20 VITALS — BP 130/82 | HR 92 | Temp 98.9°F | Ht 66.0 in | Wt 190.9 lb

## 2012-12-20 DIAGNOSIS — R109 Unspecified abdominal pain: Secondary | ICD-10-CM

## 2012-12-20 DIAGNOSIS — K219 Gastro-esophageal reflux disease without esophagitis: Secondary | ICD-10-CM

## 2012-12-20 DIAGNOSIS — R011 Cardiac murmur, unspecified: Secondary | ICD-10-CM

## 2012-12-20 DIAGNOSIS — E119 Type 2 diabetes mellitus without complications: Secondary | ICD-10-CM

## 2012-12-20 DIAGNOSIS — I10 Essential (primary) hypertension: Secondary | ICD-10-CM

## 2012-12-20 DIAGNOSIS — Q210 Ventricular septal defect: Secondary | ICD-10-CM

## 2012-12-20 DIAGNOSIS — E1129 Type 2 diabetes mellitus with other diabetic kidney complication: Secondary | ICD-10-CM

## 2012-12-20 DIAGNOSIS — E785 Hyperlipidemia, unspecified: Secondary | ICD-10-CM

## 2012-12-20 LAB — COMPLETE METABOLIC PANEL WITH GFR
Albumin: 4.6 g/dL (ref 3.5–5.2)
Alkaline Phosphatase: 50 U/L (ref 39–117)
BUN: 15 mg/dL (ref 6–23)
CO2: 29 mEq/L (ref 19–32)
Calcium: 10.1 mg/dL (ref 8.4–10.5)
Chloride: 101 mEq/L (ref 96–112)
GFR, Est Non African American: 75 mL/min
Glucose, Bld: 129 mg/dL — ABNORMAL HIGH (ref 70–99)
Potassium: 3.5 mEq/L (ref 3.5–5.3)
Sodium: 143 mEq/L (ref 135–145)
Total Protein: 7.2 g/dL (ref 6.0–8.3)

## 2012-12-20 MED ORDER — METFORMIN HCL 1000 MG PO TABS: 1000.0000 mg | ORAL_TABLET | Freq: Two times a day (BID) | ORAL | Status: DC

## 2012-12-20 MED ORDER — PANTOPRAZOLE SODIUM 40 MG PO TBEC: 40.0000 mg | DELAYED_RELEASE_TABLET | Freq: Every day | ORAL | Status: DC

## 2012-12-20 MED ORDER — HYDROCHLOROTHIAZIDE 25 MG PO TABS: 25.0000 mg | ORAL_TABLET | Freq: Every day | ORAL | Status: DC

## 2012-12-20 NOTE — Patient Instructions (Addendum)
Goals (1 Years of Data) as of 12/20/12         As of Today 09/20/12 08/18/12 08/18/12 07/26/12     Blood Pressure    . Blood Pressure < 140/90  130/82 136/82 132/89 135/90 136/90     Result Component    . HEMOGLOBIN A1C < 7.0  6.2 6.5       . LDL CALC < 100   76          Hypoglycemia (Low Blood Sugar) Hypoglycemia is when the glucose (sugar) in your blood is too low. Hypoglycemia can happen for many reasons. It can happen to people with or without diabetes. Hypoglycemia can develop quickly and can be a medical emergency.  CAUSES  Having hypoglycemia does not mean that you will develop diabetes. Different causes include:  Missed or delayed meals or not enough carbohydrates eaten.  Medication overdose. This could be by accident or deliberate. If by accident, your medication may need to be adjusted or changed.  Exercise or increased activity without adjustments in carbohydrates or medications.  A nerve disorder that affects body functions like your heart rate, blood pressure and digestion (autonomic neuropathy).  A condition where the stomach muscles do not function properly (gastroparesis). Therefore, medications may not absorb properly.  The inability to recognize the signs of hypoglycemia (hypoglycemic unawareness).  Absorption of insulin  may be altered.  Alcohol consumption.  Pregnancy/menstrual cycles/postpartum. This may be due to hormones.  Certain kinds of tumors. This is very rare. SYMPTOMS   Sweating.  Hunger.  Dizziness.  Blurred vision.  Drowsiness.  Weakness.  Headache.  Rapid heart beat.  Shakiness.  Nervousness. DIAGNOSIS  Diagnosis is made by monitoring blood glucose in one or all of the following ways:  Fingerstick blood glucose monitoring.  Laboratory results. TREATMENT  If you think your blood glucose is low:  Check your blood glucose, if possible. If it is less than 70 mg/dl, take one of the following:  3-4 glucose tablets.   cup  juice (prefer clear like apple).   cup "regular" soda pop.  1 cup milk.  -1 tube of glucose gel.  5-6 hard candies.  Do not over treat because your blood glucose (sugar) will only go too high.  Wait 15 minutes and recheck your blood glucose. If it is still less than 70 mg/dl (or below your target range), repeat treatment.  Eat a snack if it is more than one hour until your next meal. Sometimes, your blood glucose may go so low that you are unable to treat yourself. You may need someone to help you. You may even pass out or be unable to swallow. This may require you to get an injection of glucagon, which raises the blood glucose. HOME CARE INSTRUCTIONS  Check blood glucose as recommended by your caregiver.  Take medication as prescribed by your caregiver.  Follow your meal plan. Do not skip meals. Eat on time.  If you are going to drink alcohol, drink it only with meals.  Check your blood glucose before driving.  Check your blood glucose before and after exercise. If you exercise longer or different than usual, be sure to check blood glucose more frequently.  Always carry treatment with you. Glucose tablets are the easiest to carry.  Always wear medical alert jewelry or carry some form of identification that states that you have diabetes. This will alert people that you have diabetes. If you have hypoglycemia, they will have a better idea on what  to do. SEEK MEDICAL CARE IF:   You are having problems keeping your blood sugar at target range.  You are having frequent episodes of hypoglycemia.  You feel you might be having side effects from your medicines.  You have symptoms of an illness that is not improving after 3-4 days.  You notice a change in vision or a new problem with your vision. SEEK IMMEDIATE MEDICAL CARE IF:   You are a family member or friend of a person whose blood glucose goes below 70 mg/dl and is accompanied by:  Confusion.  A change in mental  status.  The inability to swallow.  Passing out. Document Released: 12/21/2004 Document Revised: 03/15/2011 Document Reviewed: 04/19/2011 [facility] Patient Information 2014 [place], [place].  Diabetes and Foot Care Diabetes may cause you to have problems because of poor blood supply (circulation) to your feet and legs. This may cause the skin on your feet to become thinner, break easier, and heal more slowly. Your skin may become dry, and the skin may peel and crack. You may also have nerve damage in your legs and feet causing decreased feeling in them. You may not notice minor injuries to your feet that could lead to infections or more serious problems. Taking care of your feet is one of the most important things you can do for yourself.  HOME CARE INSTRUCTIONS  Wear shoes at all times, even in the house. Do not go barefoot. Bare feet are easily injured.  Check your feet daily for blisters, cuts, and redness. If you cannot see the bottom of your feet, use a mirror or ask someone for help.  Wash your feet with warm water (do not use hot water) and mild soap. Then pat your feet and the areas between your toes until they are completely dry. Do not soak your feet as this can dry your skin.  Apply a moisturizing lotion or petroleum jelly (that does not contain alcohol and is unscented) to the skin on your feet and to dry, brittle toenails. Do not apply lotion between your toes.  Trim your toenails straight across. Do not dig under them or around the cuticle. File the edges of your nails with an emery board or nail file.  Do not cut corns or calluses or try to remove them with medicine.  Wear clean socks or stockings every day. Make sure they are not too tight. Do not wear knee-high stockings since they may decrease blood flow to your legs.  Wear shoes that fit properly and have enough cushioning. To break in new shoes, wear them for just a few hours a day. This prevents you from injuring your  feet. Always look in your shoes before you put them on to be sure there are no objects inside.  Do not cross your legs. This may decrease the blood flow to your feet.  If you find a minor scrape, cut, or break in the skin on your feet, keep it and the skin around it clean and dry. These areas may be cleansed with mild soap and water. Do not cleanse the area with peroxide, alcohol, or iodine.  When you remove an adhesive bandage, be sure not to damage the skin around it.  If you have a wound, look at it several times a day to make sure it is healing.  Do not use heating pads or hot water bottles. They may burn your skin. If you have lost feeling in your feet or legs, you may not  know it is happening until it is too late.  Make sure your health care provider performs a complete foot exam at least annually or more often if you have foot problems. Report any cuts, sores, or bruises to your health care provider immediately. SEEK MEDICAL CARE IF:   You have an injury that is not healing.  You have cuts or breaks in the skin.  You have an ingrown nail.  You notice redness on your legs or feet.  You feel burning or tingling in your legs or feet.  You have pain or cramps in your legs and feet.  Your legs or feet are numb.  Your feet always feel cold. SEEK IMMEDIATE MEDICAL CARE IF:   There is increasing redness, swelling, or pain in or around a wound.  There is a red line that goes up your leg.  Pus is coming from a wound.  You develop a fever or as directed by your health care provider.  You notice a bad smell coming from an ulcer or wound. Document Released: 12/19/1999 Document Revised: 08/23/2012 Document Reviewed: 05/30/2012 [facility] Patient Information 2014 [place], [place].

## 2012-12-20 NOTE — Assessment & Plan Note (Addendum)
Assessment:  2 week history of intermittent 2/10 epigastric pain.  No concerning features on exam.  She denies CP/SOB, N/V, fatigue, weakness, fevers/chills.  She says the pain tends to occur after eating.  She denies reflux symptoms but occasionally takes Protonix for previously diagnosed GERD.  Chronic abdominal pain is listed on her problem list.  She had cholecystectomy years ago.  Plan:   1) Patient agrees to take Protonix daily   2) Will check liver enzymes today  3) Follow-up in 3 months or sooner if symptoms do not resolve

## 2012-12-20 NOTE — Assessment & Plan Note (Signed)
Lab Results  Component Value Date   HGBA1C 6.2 12/20/2012   HGBA1C 6.5 09/20/2012   HGBA1C 6.6 06/13/2012     Assessment: Diabetes control: good control (HgbA1C at goal) Progress toward A1C goal:  at goal  Plan: Medications:  Continue metformin 1000mg  BID Home glucose monitoring: Frequency: no home glucose monitoring Timing: N/A Instruction/counseling given: Patient advised of symptoms of hypoglycemia and what to do if symptoms occur. Other plans: Hgb A1c in 3 months

## 2012-12-20 NOTE — Assessment & Plan Note (Addendum)
Assessment:  Per patient, she tried to make an appointment with [facility] but her cardiologist, Dr. Alanda Amass has retired.  She has no signs or symptoms of HF.  She is agreeable to referral to another provider.  Plan:  Referral to [facility].

## 2012-12-20 NOTE — Assessment & Plan Note (Signed)
LDL stable on pravastatin 40mg  daily.  Will continue this medication.

## 2012-12-20 NOTE — Progress Notes (Signed)
   Subjective:    Patient ID: Janice Massey, female    DOB: 1955/12/10, 57 y.o.   MRN: [id number]  HPI Comments: Janice Massey is a 57 year old woman with a PMH of deafness, HTN, VSD, DM type 2, HLD and GERD.  She presents for routine follow-up of her HTN and DM.  She also has complaint of two week history of intermittent, 2/10 abdominal pain associated with eating.  She denies change in appetite, N/V, fevers/chills or change in bowel or bladder habits.  She does not take her Protonix regularly and denies reflux symptoms.  She has not tried anything for symptom relief.  She had a cholecystectomy several years ago.    Her HTN is well controlled today on HCTZ.  She is supposed to take Exforge but says she was unable to pick the medication up from the pharmacy because of insurance issue.  She has not taken Exforge in about 1 month.  Clinic records show she should be on Toprol but she said she is unfamiliar with this medication.  Her DM is well controlled on metformin 1000mg  BID.  HgbA1c is 6.2 (improved from 6.5 three months ago).       Review of Systems  Constitutional: Negative for fever, chills, appetite change and fatigue.  Eyes: Negative for visual disturbance.  Respiratory: Negative for cough and shortness of breath.   Cardiovascular: Positive for palpitations. Negative for chest pain and leg swelling.  Gastrointestinal: Positive for diarrhea. Negative for nausea, vomiting, constipation and blood in stool.       Occasional diarrhea, likely related to metformin.  Patient reports it is not frequent enough to interfere with her routine.  Genitourinary: Negative for dysuria and hematuria.  Neurological: Negative for weakness, numbness and headaches.       Objective:   Physical Exam  Vitals reviewed. Constitutional: She is oriented to person, place, and time. She appears well-developed and well-nourished. No distress.  HENT:  Head: Normocephalic and atraumatic.  Mouth/Throat: Oropharynx is  clear and moist. No oropharyngeal exudate.  Eyes: EOM are normal. Pupils are equal, round, and reactive to light. Right eye exhibits no discharge. Left eye exhibits no discharge. No scleral icterus.  Neck: Neck supple.  Cardiovascular: Normal rate, regular rhythm and normal heart sounds.   Pulmonary/Chest: Effort normal and breath sounds normal. No respiratory distress. She has no wheezes. She has no rales.  Abdominal: Soft. Bowel sounds are normal. She exhibits no distension and no mass. There is no rebound and no guarding.  + epigastric and RUQ TTP (patient reports 3/10 pain with palpation)  Musculoskeletal: Normal range of motion. She exhibits no edema and no tenderness.  Lymphadenopathy:    She has no cervical adenopathy.  Neurological: She is alert and oriented to person, place, and time. No cranial nerve deficit. She exhibits normal muscle tone.  Intact ankle jerk reflex, toe proprioception and feet sensation B/L  Skin: Skin is warm. She is not diaphoretic.  Psychiatric: She has a normal mood and affect. Her behavior is normal.          Assessment & Plan:  Please see problem based assessment and plan.

## 2012-12-20 NOTE — Assessment & Plan Note (Signed)
BP Readings from Last 3 Encounters:  12/20/12 130/82  09/20/12 136/82  08/18/12 132/89    Lab Results  Component Value Date   NA 138 09/20/2012   K 3.9 09/20/2012   CREATININE 0.81 09/20/2012    Assessment: Blood pressure control: controlled Progress toward BP goal:  at goal Comments: Patient's BP well controlled on HCTZ alone for the past month.  She was unable to pick up Exforge due to insurance issue.    Plan: Medications:  Continue HCTZ 25mg  daily; discontinue Exforge since her BP is well controlled without it. Educational resources provided: brochure Other plans: Will send for medical records from [facility] to see if they placed patient on Toprol at any point.  The patient says she has not been prescribed this medication but it is on her medication list.

## 2012-12-21 NOTE — Progress Notes (Signed)
I saw and evaluated the patient.  I personally confirmed the key portions of the history and exam documented by Dr. Wilson and I reviewed pertinent patient test results.  The assessment, diagnosis, and plan were formulated together and I agree with the documentation in the resident's note. 

## 2012-12-26 ENCOUNTER — Ambulatory Visit: Payer: No Typology Code available for payment source | Admitting: Family Medicine

## 2013-01-09 ENCOUNTER — Ambulatory Visit: Payer: Medicaid Other | Admitting: Cardiovascular Disease

## 2013-01-15 NOTE — Addendum Note (Signed)
Addended by: Hulan Fray on: 01/15/2013 06:56 PM   Modules accepted: Orders

## 2013-01-19 ENCOUNTER — Encounter: Payer: Self-pay | Admitting: Cardiovascular Disease

## 2013-01-22 ENCOUNTER — Ambulatory Visit (INDEPENDENT_AMBULATORY_CARE_PROVIDER_SITE_OTHER): Payer: No Typology Code available for payment source | Admitting: Family Medicine

## 2013-01-22 VITALS — BP 101/78 | HR 81 | Temp 97.2°F | Ht 64.0 in | Wt 163.0 lb

## 2013-01-22 DIAGNOSIS — B349 Viral infection, unspecified: Secondary | ICD-10-CM | POA: Insufficient documentation

## 2013-01-22 DIAGNOSIS — B9789 Other viral agents as the cause of diseases classified elsewhere: Secondary | ICD-10-CM

## 2013-01-22 NOTE — Patient Instructions (Signed)
Viral Infections A virus is a type of germ. Viruses can cause:  Minor sore throats.  Aches and pains.  Headaches.  Runny nose.  Rashes.  Watery eyes.  Tiredness.  Coughs.  Loss of appetite.  Feeling sick to your stomach (nausea).  Throwing up (vomiting).  Watery poop (diarrhea). HOME CARE   Only take medicines as told by your doctor.  Drink enough water and fluids to keep your pee (urine) clear or pale yellow. Sports drinks are a good choice.  Get plenty of rest and eat healthy. Soups and broths with crackers or rice are fine. GET HELP RIGHT AWAY IF:   You have a very bad headache.  You have shortness of breath.  You have chest pain or neck pain.  You have an unusual rash.  You cannot stop throwing up.  You have watery poop that does not stop.  You cannot keep fluids down.  You or your child has a temperature by mouth above 102 F (38.9 C), not controlled by medicine.  Your baby is older than 3 months with a rectal temperature of 102 F (38.9 C) or higher.  Your baby is 643 months old or younger with a rectal temperature of 100.4 F (38 C) or higher. MAKE SURE YOU:   Understand these instructions.  Will watch this condition.  Will get help right away if you are not doing well or get worse. Document Released: 12/04/2007 Document Revised: 03/15/2011 Document Reviewed: 04/28/2010 [facility] Patient Information 2014 [place], [place].   May take over the counter cough/cold remedies, may take tylenol as needed for fever

## 2013-01-22 NOTE — Assessment & Plan Note (Signed)
Patient presents with signs and symptoms consistent with viral illness -Discussed conservative management including over-the-counter cough and cold medications, Tylenol as needed for fevers, rehydration, rest, humidified air to help with congestion

## 2013-01-22 NOTE — Progress Notes (Signed)
   Subjective:    Patient ID: Brandi Mendez, female    DOB: [birth year], 58 y.o.   MRN: [id number]  HPI 58 year old female presents with two-day history of nasal congestion, cough, sore throat, and myalgias. She does report that her granddaughter does have similar symptoms. She reports a history of nausea, diarrhea, and emesis last week, all of these symptoms have resolved, cough is nonproductive, no chest pain, no shortness of breath, she did report some mild epigastric abdominal pain that his result today, she did attempt over-the-counter Alka-Seltzer cold which provided mild relief of her symptoms   Review of Systems  Constitutional: Positive for chills and fatigue. Negative for fever.  HENT: Positive for congestion, postnasal drip, rhinorrhea, sinus pressure and sore throat.   Respiratory: Positive for cough. Negative for choking and shortness of breath.   Cardiovascular: Negative for chest pain and leg swelling.  Gastrointestinal: Negative for nausea, vomiting, abdominal pain, diarrhea and abdominal distention.  Skin: Negative for rash.       Objective:   Physical Exam Vitals: Reviewed General: Pleasant African American female, no acute distress HEENT, normocephalic, pupils are equal round and reactive to light, extraocular movements are intact, no scleral icterus, rhinorrhea present, nasal septum midline, bilateral TMs are pearly-gray without exudate or erythema, moist mucous membranes, postnasal drip present, no exudates, neck was supple, no anterior posterior cervical lymphadenopathy Cardiac: Regular rate and rhythm, S1 and S2 present, no murmurs, no heaves or thrills Respiratory: Clear to auscultation bilaterally, normal effort Abdomen: Soft, mild epigastric tenderness, normal bowel sounds Skin: No rash       Assessment & Plan:  Please see problem specific assessment and plan

## 2013-02-15 ENCOUNTER — Ambulatory Visit: Payer: Medicaid Other | Admitting: Cardiovascular Disease

## 2013-02-17 ENCOUNTER — Encounter (HOSPITAL_COMMUNITY): Payer: Self-pay | Admitting: Emergency Medicine

## 2013-02-17 ENCOUNTER — Emergency Department (HOSPITAL_COMMUNITY)
Admission: EM | Admit: 2013-02-17 | Discharge: 2013-02-17 | Disposition: A | Discharge status: Home / Self Care | Payer: No Typology Code available for payment source | Attending: Emergency Medicine | Admitting: Emergency Medicine

## 2013-02-17 DIAGNOSIS — Z9089 Acquired absence of other organs: Secondary | ICD-10-CM | POA: Insufficient documentation

## 2013-02-17 DIAGNOSIS — Z8719 Personal history of other diseases of the digestive system: Secondary | ICD-10-CM | POA: Insufficient documentation

## 2013-02-17 DIAGNOSIS — Z90710 Acquired absence of both cervix and uterus: Secondary | ICD-10-CM | POA: Insufficient documentation

## 2013-02-17 DIAGNOSIS — Z9889 Other specified postprocedural states: Secondary | ICD-10-CM | POA: Insufficient documentation

## 2013-02-17 DIAGNOSIS — R109 Unspecified abdominal pain: Secondary | ICD-10-CM | POA: Insufficient documentation

## 2013-02-17 DIAGNOSIS — R112 Nausea with vomiting, unspecified: Secondary | ICD-10-CM | POA: Insufficient documentation

## 2013-02-17 DIAGNOSIS — R197 Diarrhea, unspecified: Secondary | ICD-10-CM | POA: Insufficient documentation

## 2013-02-17 LAB — URINALYSIS, ROUTINE W REFLEX MICROSCOPIC
BILIRUBIN URINE: NEGATIVE
Glucose, UA: NEGATIVE mg/dL
KETONES UR: NEGATIVE mg/dL
NITRITE: NEGATIVE
PH: 5.5 (ref 5.0–8.0)
Protein, ur: NEGATIVE mg/dL
Specific Gravity, Urine: 1.031 — ABNORMAL HIGH (ref 1.005–1.030)
Urobilinogen, UA: 1 mg/dL (ref 0.0–1.0)

## 2013-02-17 LAB — CBC WITH DIFFERENTIAL/PLATELET
BASOS PCT: 0 % (ref 0–1)
Basophils Absolute: 0 10*3/uL (ref 0.0–0.1)
EOS ABS: 0 10*3/uL (ref 0.0–0.7)
EOS PCT: 0 % (ref 0–5)
HCT: 37.1 % (ref 36.0–46.0)
HEMOGLOBIN: 12.8 g/dL (ref 12.0–15.0)
Lymphocytes Relative: 20 % (ref 12–46)
Lymphs Abs: 1.3 10*3/uL (ref 0.7–4.0)
MCH: 32 pg (ref 26.0–34.0)
MCHC: 34.5 g/dL (ref 30.0–36.0)
MCV: 92.8 fL (ref 78.0–100.0)
MONO ABS: 0.3 10*3/uL (ref 0.1–1.0)
MONOS PCT: 5 % (ref 3–12)
NEUTROS PCT: 74 % (ref 43–77)
Neutro Abs: 4.6 10*3/uL (ref 1.7–7.7)
Platelets: 261 10*3/uL (ref 150–400)
RBC: 4 MIL/uL (ref 3.87–5.11)
RDW: 13.2 % (ref 11.5–15.5)
WBC: 6.2 10*3/uL (ref 4.0–10.5)

## 2013-02-17 LAB — COMPREHENSIVE METABOLIC PANEL
ALT: 14 U/L (ref 0–35)
AST: 15 U/L (ref 0–37)
Albumin: 4.1 g/dL (ref 3.5–5.2)
Alkaline Phosphatase: 72 U/L (ref 39–117)
BUN: 17 mg/dL (ref 6–23)
CALCIUM: 9.1 mg/dL (ref 8.4–10.5)
CO2: 25 mEq/L (ref 19–32)
CREATININE: 0.64 mg/dL (ref 0.50–1.10)
Chloride: 99 mEq/L (ref 96–112)
GLUCOSE: 87 mg/dL (ref 70–99)
Potassium: 3.4 mEq/L — ABNORMAL LOW (ref 3.7–5.3)
SODIUM: 138 meq/L (ref 137–147)
TOTAL PROTEIN: 7.7 g/dL (ref 6.0–8.3)
Total Bilirubin: 1.5 mg/dL — ABNORMAL HIGH (ref 0.3–1.2)

## 2013-02-17 LAB — URINE MICROSCOPIC-ADD ON

## 2013-02-17 LAB — LIPASE, BLOOD: Lipase: 12 U/L (ref 11–59)

## 2013-02-17 MED ORDER — ONDANSETRON 4 MG PO TBDP: 4.0000 mg | ORAL_TABLET | Freq: Three times a day (TID) | ORAL | Status: DC | PRN

## 2013-02-17 MED ORDER — ONDANSETRON HCL 4 MG/2ML IJ SOLN
4.0000 mg | Freq: Once | INTRAMUSCULAR | Status: AC
  Administered 2013-02-17: 4 mg via INTRAVENOUS
  Filled 2013-02-17: qty 2

## 2013-02-17 MED ORDER — SODIUM CHLORIDE 0.9 % IV BOLUS (SEPSIS)
1000.0000 mL | Freq: Once | INTRAVENOUS | Status: AC
  Administered 2013-02-17: 1000 mL via INTRAVENOUS

## 2013-02-17 MED ORDER — HYDROMORPHONE HCL PF 1 MG/ML IJ SOLN
0.5000 mg | Freq: Once | INTRAMUSCULAR | Status: AC
  Administered 2013-02-17: 0.5 mg via INTRAVENOUS
  Filled 2013-02-17: qty 1

## 2013-02-17 NOTE — ED Provider Notes (Addendum)
CSN: [id number]     Arrival date & time 02/17/13  2002 History   First MD Initiated Contact with Patient 02/17/13 2107     Chief Complaint  Patient presents with  . Abdominal Pain     (Consider location/radiation/quality/duration/timing/severity/associated sxs/prior Treatment) HPI Patient presents with concern of abdominal pain.  Pain has been present for approximately 18 hours, though there was a minor discomfort yesterday. Since onset has been focally about the mid abdomen, nonradiating, sore.  There is associated nausea, vomiting, and multiple episodes of diarrhea. No new fever, chills, chest pain dyspnea, confusion, disorientation. Patient has multiple family members with similar illness.  Past Medical History  Diagnosis Date  . Small bowel obstruction 2013   Past Surgical History  Procedure Laterality Date  . Abdominal hysterectomy    . Cholecystectomy    . Abdominal hernia repair     No family history on file. History  Substance Use Topics  . Smoking status: Never Smoker   . Smokeless tobacco: Not on file  . Alcohol Use: Yes     Comment: sometimes   OB History   Grav Para Term Preterm Abortions TAB SAB Ect Mult Living                 Review of Systems  Constitutional:       Per HPI, otherwise negative  HENT:       Per HPI, otherwise negative  Respiratory:       Per HPI, otherwise negative  Cardiovascular:       Per HPI, otherwise negative  Gastrointestinal: Positive for nausea, vomiting, abdominal pain and diarrhea.  Endocrine:       Negative aside from HPI  Genitourinary:       Neg aside from HPI   Musculoskeletal:       Per HPI, otherwise negative  Skin: Negative.   Neurological: Negative for syncope.      Allergies  Review of patient's allergies indicates no known allergies.  Home Medications   Current Outpatient Rx  Name  Route  Sig  Dispense  Refill  . nitroGLYCERIN (NITROSTAT) 0.4 MG SL tablet   Sublingual   Place 1 tablet (0.4 mg  total) under the tongue every 5 (five) minutes as needed for chest pain.   20 tablet   3    BP 106/69  Pulse 81  Temp(Src) 97.9 F (36.6 C) (Oral)  Resp 20  Wt 167 lb 4 oz (75.864 kg)  SpO2 98% Physical Exam  Nursing note and vitals reviewed. Constitutional: She is oriented to person, place, and time. She appears well-developed and well-nourished. No distress.  HENT:  Head: Normocephalic and atraumatic.  Eyes: Conjunctivae and EOM are normal.  Cardiovascular: Normal rate and regular rhythm.   Pulmonary/Chest: Effort normal and breath sounds normal. No stridor. No respiratory distress.  Abdominal: She exhibits no distension. There is no tenderness. There is no rebound and no guarding.  Though the patient identifies pain in the mid abdomen, there is no tenderness to palpation, no guarding, no rebound, no peritoneal findings.  Musculoskeletal: She exhibits no edema.  Neurological: She is alert and oriented to person, place, and time. No cranial nerve deficit.  Skin: Skin is warm and dry.  Psychiatric: She has a normal mood and affect.    ED Course  Procedures (including critical care time) Labs Review Labs Reviewed  URINALYSIS, ROUTINE W REFLEX MICROSCOPIC - Abnormal; Notable for the following:    APPearance CLOUDY (*)    Specific  Gravity, Urine 1.031 (*)    Hgb urine dipstick MODERATE (*)    Leukocytes, UA LARGE (*)    All other components within normal limits  COMPREHENSIVE METABOLIC PANEL - Abnormal; Notable for the following:    Potassium 3.4 (*)    Total Bilirubin 1.5 (*)    All other components within normal limits  URINE MICROSCOPIC-ADD ON - Abnormal; Notable for the following:    Squamous Epithelial / LPF FEW (*)    Bacteria, UA FEW (*)    All other components within normal limits  CBC WITH DIFFERENTIAL  LIPASE, BLOOD     I reviewed the patient's chart.  10:56 PM Patient feels better MDM  This patient presents with one day of abdominal pain, nausea,  vomiting, diarrhea.  On exam she is awake, alert, afebrile, hemodynamically stable.  Patient is a soft, no peritoneal abdomen.  With description of multiple family members with similar illness, there is low suspicion for bowel obstruction or other acute systemic illness.  In addition, patient has had no leukocytosis, and improved here.  Patient was discharged in stable condition   Gerhard Munchobert Tallulah Hosman, MD 02/17/13 2256   Update: Just prior to discharge the patient had an episode of pain that she described as being similar to indigestion.  EKG is unremarkable.  EKG Interpretation    Date/Time:  Saturday February 17 2013 23:27:04 EST Ventricular Rate:  64 PR Interval:  160 QRS Duration: 103 QT Interval:  423 QTC Calculation: 436 R Axis:   85 Text Interpretation:  Sinus rhythm Abnormal R-wave progression, early transition Baseline wander in lead(s) V6 Sinus rhythm Artifact No significant change since last tracing Abnormal ekg Confirmed by Gerhard MunchLOCKWOOD, Nayshawn Mesta  MD ([id number]) on 02/18/2013 12:05:24 AM             Gerhard Munchobert Kiira Brach, MD 02/18/13 0005

## 2013-02-17 NOTE — ED Notes (Signed)
EKG given to Dr Jeraldine LootsLockwood. Received OK to discharge pt.

## 2013-02-17 NOTE — ED Notes (Signed)
Pt states that she has a ride home with her cousin.

## 2013-02-17 NOTE — Discharge Instructions (Signed)
As discussed, it is important that you follow up as soon as possible with your physician for continued management of your condition. ° °If you develop any new, or concerning changes in your condition, please return to the emergency department immediately. ° °

## 2013-02-17 NOTE — ED Notes (Signed)
The pt is c/o abd pain with nv and diarrhea since 0400am today. Everyone in the family has the same symptoms.  lmp none

## 2013-02-27 ENCOUNTER — Ambulatory Visit: Payer: Medicaid Other | Admitting: Cardiovascular Disease

## 2013-03-09 ENCOUNTER — Ambulatory Visit (INDEPENDENT_AMBULATORY_CARE_PROVIDER_SITE_OTHER): Payer: Medicaid Other | Admitting: Cardiovascular Disease

## 2013-03-09 ENCOUNTER — Encounter: Payer: Self-pay | Admitting: Cardiovascular Disease

## 2013-03-09 VITALS — BP 140/78 | HR 85 | Ht 67.0 in | Wt 191.4 lb

## 2013-03-09 DIAGNOSIS — R011 Cardiac murmur, unspecified: Secondary | ICD-10-CM

## 2013-03-09 DIAGNOSIS — E785 Hyperlipidemia, unspecified: Secondary | ICD-10-CM

## 2013-03-09 DIAGNOSIS — E1129 Type 2 diabetes mellitus with other diabetic kidney complication: Secondary | ICD-10-CM

## 2013-03-09 DIAGNOSIS — I10 Essential (primary) hypertension: Secondary | ICD-10-CM

## 2013-03-09 DIAGNOSIS — Q210 Ventricular septal defect: Secondary | ICD-10-CM

## 2013-03-09 NOTE — Assessment & Plan Note (Signed)
Well controlled.  Continue current medications and low sodium Dash type diet.    

## 2013-03-09 NOTE — Assessment & Plan Note (Signed)
Cholesterol is at goal.  Continue current dose of statin and diet Rx.  No myalgias or side effects.  F/U  LFT's in 6 months. Lab Results  Component Value Date   LDLCALC 76 09/20/2012

## 2013-03-09 NOTE — Assessment & Plan Note (Signed)
Discussed low carb diet.  Target hemoglobin A1c is 6.5 or less.  Continue current medications. F/U primary and consider ARB/ACE for renal protection

## 2013-03-09 NOTE — Patient Instructions (Signed)

## 2013-03-09 NOTE — Progress Notes (Signed)
Patient ID: Janice Massey, female   DOB: May 28, 1955, 58 y.o.   MRN: [id number]   58 yo deaf patient seen by Dr Rollene Fare in past.  History of presumed VSD.  No dyspnea palpitations or syncope No chest pain.  She has not had SBE. Echo in system from 2014 does not mention VSD  Long standing DM HTN and elevated lipids On appropriate Rx  Not clear why she is not on ACE Has hearing impaired services at home   04/19/12 Study Conclusions  - Mitral valve: Valve area by pressure half-time: 2.14cm^2. - Pulmonary arteries: PA peak pressure: 21mm Hg (S). Transthoracic echocardiography. M-mode, complete 2D, spectral Doppler, and color Doppler. Height: Height: 170.2cm. Height: 67in. Weight: Weight: 86.2kg. Weight: 189.6lb. Body mass index: BMI: 29.8kg/m^2. Body surface area: BSA: 1.84m^2. Blood pressure: 125/70. Patient status: Outpatient. Location: Echo laboratory.       ROS: Denies fever, malais, weight loss, blurry vision, decreased visual acuity, cough, sputum, SOB, hemoptysis, pleuritic pain, palpitaitons, heartburn, abdominal pain, melena, lower extremity edema, claudication, or rash.  All other systems reviewed and negative   General: Affect appropriate Healthy:  appears stated age 10: normal Neck supple with no adenopathy JVP normal no bruits no thyromegaly Lungs clear with no wheezing and good diaphragmatic motion Heart:  S1/S2 VSD murmur,rub, gallop or click PMI normal Abdomen: benighn, BS positve, no tenderness, no AAA no bruit.  No HSM or HJR Distal pulses intact with no bruits No edema Neuro non-focal Skin warm and dry No muscular weakness  Medications Current Outpatient Prescriptions  Medication Sig Dispense Refill  . antipyrine-benzocaine (AURALGAN) otic solution Place 3 drops into both ears every 2 (two) hours as needed for pain.  10 mL  0  . BD ULTRA-FINE LANCETS lancets To test blood glucose daily       . Blood Glucose Monitoring Suppl (FREESTYLE LITE) DEVI Check  blood glucose once daily before breakfast or as directed       . glucose blood (FREESTYLE LITE) test strip To test blood sugar one time daily       . hydrochlorothiazide (HYDRODIURIL) 25 MG tablet Take 1 tablet (25 mg total) by mouth daily.  90 tablet  1  . metFORMIN (GLUCOPHAGE) 1000 MG tablet Take 1 tablet (1,000 mg total) by mouth 2 (two) times daily.  180 tablet  1  . metoprolol succinate (TOPROL-XL) 25 MG 24 hr tablet Take 1 tablet (25 mg total) by mouth daily.  30 tablet  6  . pantoprazole (PROTONIX) 40 MG tablet Take 1 tablet (40 mg total) by mouth daily.  90 tablet  1  . pravastatin (PRAVACHOL) 40 MG tablet Take 1 tablet (40 mg total) by mouth daily.  90 tablet  3   No current facility-administered medications for this visit.    Allergies Codeine and Penicillins  Family History: No family history on file.  Social History: History   Social History  . Marital Status: Single    Spouse Name: N/A    Number of Children: 1  . Years of Education: N/A   Occupational History  .     Social History Main Topics  . Smoking status: Never Smoker   . Smokeless tobacco: Not on file  . Alcohol Use: No  . Drug Use: No  . Sexual Activity: Not on file   Other Topics Concern  . Not on file   Social History Narrative  . No narrative on file    Electrocardiogram:  SR rate 85 Insig q  waves in inf leads   Assessment and Plan

## 2013-03-09 NOTE — Assessment & Plan Note (Signed)
Not clear from incomplete echo note what her history is Will try to get records from [facility] F/U echo Murmur may be VSD Would appear to be restrictive by history with no RVE and  Normal PA pressures

## 2013-03-21 ENCOUNTER — Ambulatory Visit (INDEPENDENT_AMBULATORY_CARE_PROVIDER_SITE_OTHER): Payer: Medicaid Other | Admitting: Internal Medicine

## 2013-03-21 VITALS — BP 126/84 | HR 92 | Temp 97.9°F | Ht 67.0 in | Wt 191.8 lb

## 2013-03-21 DIAGNOSIS — I10 Essential (primary) hypertension: Secondary | ICD-10-CM

## 2013-03-21 DIAGNOSIS — N058 Unspecified nephritic syndrome with other morphologic changes: Secondary | ICD-10-CM

## 2013-03-21 DIAGNOSIS — E1129 Type 2 diabetes mellitus with other diabetic kidney complication: Secondary | ICD-10-CM

## 2013-03-21 DIAGNOSIS — Z0000 Encounter for general adult medical examination without abnormal findings: Secondary | ICD-10-CM

## 2013-03-21 DIAGNOSIS — R040 Epistaxis: Secondary | ICD-10-CM

## 2013-03-21 LAB — POCT GLYCOSYLATED HEMOGLOBIN (HGB A1C): Hemoglobin A1C: 6.6

## 2013-03-21 LAB — GLUCOSE, CAPILLARY: GLUCOSE-CAPILLARY: 110 mg/dL — AB (ref 70–99)

## 2013-03-21 MED ORDER — LISINOPRIL 5 MG PO TABS: 5.0000 mg | ORAL_TABLET | Freq: Every day | ORAL | Status: DC

## 2013-03-21 MED ORDER — HYDROCHLOROTHIAZIDE 12.5 MG PO TABS
12.5000 mg | ORAL_TABLET | Freq: Every day | ORAL | Status: DC
Start: 2013-03-21 — End: 2013-04-20

## 2013-03-21 NOTE — Assessment & Plan Note (Signed)
She had 3 nose bleeds during URI three weeks ago.  No abnormalities on nasal exam.  No bleeds in the past two weeks.  These bleeds were likely 2/2 to combination of mucosal irritation from viral rhinitis and low humidity.  She was advised to call clinic if the nosebleeds return and persist.

## 2013-03-21 NOTE — Assessment & Plan Note (Signed)
Lab Results  Component Value Date   HGBA1C 6.6 03/21/2013   HGBA1C 6.2 12/20/2012   HGBA1C 6.5 09/20/2012     Assessment: Diabetes control: good control (HgbA1C at goal) Progress toward A1C goal:  at goal Comments: patient is compliant with metformin  Plan: Medications:  continue current medications:  metfomin 1000mg  BID; add lisinopril 5mg  daily Instruction/counseling given: discussed foot care Educational resources provided: brochure Other plans: Hgb A1c in 3 months
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
## 2013-03-21 NOTE — Patient Instructions (Signed)
1. A prescription for Lisinopril 5mg  once daily and hydrochlorothiazide 12.5mg  once daily has been sent to your pharmacy.     2. Please take all medications as prescribed.    3. If you have worsening of your symptoms or new symptoms arise, please call the clinic ([phone]), or go to the ER immediately if symptoms are severe.  Please return to clinic in 1 month for a blood pressure check and lab work.   Lisinopril tablets What is this medicine? LISINOPRIL (lyse IN oh pril) is an ACE inhibitor. This medicine is used to treat high blood pressure and heart failure. It is also used to protect the heart immediately after a heart attack. This medicine may be used for other purposes; ask your health care provider or pharmacist if you have questions. COMMON BRAND NAME(S): Prinivil, Zestril What should I tell my health care provider before I take this medicine? They need to know if you have any of these conditions: -diabetes -heart or blood vessel disease -immune system disease like lupus or scleroderma -kidney disease -low blood pressure -previous swelling of the tongue, face, or lips with difficulty breathing, difficulty swallowing, hoarseness, or tightening of the throat -an unusual or allergic reaction to lisinopril, other ACE inhibitors, insect venom, foods, dyes, or preservatives -pregnant or trying to get pregnant -breast-feeding How should I use this medicine? Take this medicine by mouth with a glass of water. Follow the directions on your prescription label. You may take this medicine with or without food. Take your medicine at regular intervals. Do not stop taking this medicine except on the advice of your doctor or health care professional. Talk to your pediatrician regarding the use of this medicine in children. Special care may be needed. While this drug may be prescribed for children as young as 4 years of age for selected conditions, precautions do apply. Overdosage: If you think  you have taken too much of this medicine contact a poison control center or emergency room at once. NOTE: This medicine is only for you. Do not share this medicine with others. What if I miss a dose? If you miss a dose, take it as soon as you can. If it is almost time for your next dose, take only that dose. Do not take double or extra doses. What may interact with this medicine? -diuretics -lithium -NSAIDs, medicines for pain and inflammation, like ibuprofen or naproxen -over-the-counter herbal supplements like hawthorn -potassium salts or potassium supplements -salt substitutes This list may not describe all possible interactions. Give your health care provider a list of all the medicines, herbs, non-prescription drugs, or dietary supplements you use. Also tell them if you smoke, drink alcohol, or use illegal drugs. Some items may interact with your medicine. What should I watch for while using this medicine? Visit your doctor or health care professional for regular check ups. Check your blood pressure as directed. Ask your doctor what your blood pressure should be, and when you should contact him or her. Call your doctor or health care professional if you notice an irregular or fast heart beat. Women should inform their doctor if they wish to become pregnant or think they might be pregnant. There is a potential for serious side effects to an unborn child. Talk to your health care professional or pharmacist for more information. Check with your doctor or health care professional if you get an attack of severe diarrhea, nausea and vomiting, or if you sweat a lot. The loss of too much body  fluid can make it dangerous for you to take this medicine. You may get drowsy or dizzy. Do not drive, use machinery, or do anything that needs mental alertness until you know how this drug affects you. Do not stand or sit up quickly, especially if you are an older patient. This reduces the risk of dizzy or fainting  spells. Alcohol can make you more drowsy and dizzy. Avoid alcoholic drinks. Avoid salt substitutes unless you are told otherwise by your doctor or health care professional. Do not treat yourself for coughs, colds, or pain while you are taking this medicine without asking your doctor or health care professional for advice. Some ingredients may increase your blood pressure. What side effects may I notice from receiving this medicine? Side effects that you should report to your doctor or health care professional as soon as possible: -abdominal pain with or without nausea or vomiting -allergic reactions like skin rash or hives, swelling of the hands, feet, face, lips, throat, or tongue -dark urine -difficulty breathing -dizzy, lightheaded or fainting spell -fever or sore throat -irregular heart beat, chest pain -pain or difficulty passing urine -redness, blistering, peeling or loosening of the skin, including inside the mouth -unusually weak -yellowing of the eyes or skin Side effects that usually do not require medical attention (report to your doctor or health care professional if they continue or are bothersome): -change in taste -cough -decreased sexual function or desire -headache -sun sensitivity -tiredness This list may not describe all possible side effects. Call your doctor for medical advice about side effects. You may report side effects to FDA at 1-800-FDA-1088. Where should I keep my medicine? Keep out of the reach of children. Store at room temperature between 15 and 30 degrees C (59 and 86 degrees F). Protect from moisture. Keep container tightly closed. Throw away any unused medicine after the expiration date. NOTE: This sheet is a summary. It may not cover all possible information. If you have questions about this medicine, talk to your doctor, pharmacist, or health care provider.  2014, Elsevier/Gold Standard. (2007-06-26 17:36:32)  Diabetes and Foot Care Diabetes may  cause you to have problems because of poor blood supply (circulation) to your feet and legs. This may cause the skin on your feet to become thinner, break easier, and heal more slowly. Your skin may become dry, and the skin may peel and crack. You may also have nerve damage in your legs and feet causing decreased feeling in them. You may not notice minor injuries to your feet that could lead to infections or more serious problems. Taking care of your feet is one of the most important things you can do for yourself.  HOME CARE INSTRUCTIONS  Wear shoes at all times, even in the house. Do not go barefoot. Bare feet are easily injured.  Check your feet daily for blisters, cuts, and redness. If you cannot see the bottom of your feet, use a mirror or ask someone for help.  Wash your feet with warm water (do not use hot water) and mild soap. Then pat your feet and the areas between your toes until they are completely dry. Do not soak your feet as this can dry your skin.  Apply a moisturizing lotion or petroleum jelly (that does not contain alcohol and is unscented) to the skin on your feet and to dry, brittle toenails. Do not apply lotion between your toes.  Trim your toenails straight across. Do not dig under them or around the cuticle.  File the edges of your nails with an emery board or nail file.  Do not cut corns or calluses or try to remove them with medicine.  Wear clean socks or stockings every day. Make sure they are not too tight. Do not wear knee-high stockings since they may decrease blood flow to your legs.  Wear shoes that fit properly and have enough cushioning. To break in new shoes, wear them for just a few hours a day. This prevents you from injuring your feet. Always look in your shoes before you put them on to be sure there are no objects inside.  Do not cross your legs. This may decrease the blood flow to your feet.  If you find a minor scrape, cut, or break in the skin on your  feet, keep it and the skin around it clean and dry. These areas may be cleansed with mild soap and water. Do not cleanse the area with peroxide, alcohol, or iodine.  When you remove an adhesive bandage, be sure not to damage the skin around it.  If you have a wound, look at it several times a day to make sure it is healing.  Do not use heating pads or hot water bottles. They may burn your skin. If you have lost feeling in your feet or legs, you may not know it is happening until it is too late.  Make sure your health care provider performs a complete foot exam at least annually or more often if you have foot problems. Report any cuts, sores, or bruises to your health care provider immediately. SEEK MEDICAL CARE IF:   You have an injury that is not healing.  You have cuts or breaks in the skin.  You have an ingrown nail.  You notice redness on your legs or feet.  You feel burning or tingling in your legs or feet.  You have pain or cramps in your legs and feet.  Your legs or feet are numb.  Your feet always feel cold. SEEK IMMEDIATE MEDICAL CARE IF:   There is increasing redness, swelling, or pain in or around a wound.  There is a red line that goes up your leg.  Pus is coming from a wound.  You develop a fever or as directed by your health care provider.  You notice a bad smell coming from an ulcer or wound. Document Released: 12/19/1999 Document Revised: 08/23/2012 Document Reviewed: 05/30/2012 [facility] Patient Information 2014 [employer].

## 2013-03-21 NOTE — Assessment & Plan Note (Addendum)
Urine microalbumin/creatinine ratio obtained today. She has an appointment to see ophthalmology and will have records sent to [place].

## 2013-03-21 NOTE — Assessment & Plan Note (Addendum)
BP Readings from Last 3 Encounters:  03/21/13 126/84  03/09/13 140/78  12/20/12 130/82    Lab Results  Component Value Date   NA 143 12/20/2012   K 3.5 12/20/2012   CREATININE 0.87 12/20/2012    Assessment: Blood pressure control: controlled Progress toward BP goal:  at goal Comments: patient is compliant with medications.  Plan: Medications:  The patient was previously on Exforge (amlodipine/ARB) which was d/c three months ago 2/2 to insurance no longer covering it.  She is diabetic and given her hx of proteinuria she should be on ACEI.  She does not recall ever being on an ACEI in the past or having a medication discontinued 2/2 to cough ADR.  She is agreeable to starting Lisinopril 5mg  daily.  Will decrease HCTZ to 12.5mg  daily to avoid hypotension.   Educational resources provided: brochure Self management tools provided: home blood pressure logbook Other plans: Patient will not be able to afford new rx until April 1 (two weeks from now).  She will begin HCTZ 12.5mg  and Lisinopril 5mg  at that time.  Until then she will continue HCTZ 25mg  daily.  Since lisinopril is being added will have patient return to check BP and BMP in 4 weeks.

## 2013-03-22 LAB — MICROALBUMIN / CREATININE URINE RATIO
Creatinine, Urine: 192.4 mg/dL
MICROALB UR: 8.6 mg/dL — AB (ref 0.00–1.89)
Microalb Creat Ratio: 44.7 mg/g — ABNORMAL HIGH (ref 0.0–30.0)

## 2013-03-22 NOTE — Progress Notes (Signed)
Case discussed with Dr. Wilson at the time of the visit.  We reviewed the resident's history and exam and pertinent patient test results.  I agree with the assessment, diagnosis, and plan of care documented in the resident's note. 

## 2013-03-23 ENCOUNTER — Ambulatory Visit: Payer: Self-pay | Admitting: Family Medicine

## 2013-03-28 ENCOUNTER — Encounter: Payer: Self-pay | Admitting: Cardiology

## 2013-03-28 ENCOUNTER — Ambulatory Visit (HOSPITAL_COMMUNITY): Payer: Medicaid Other | Attending: Cardiology | Admitting: Radiology

## 2013-03-28 DIAGNOSIS — I510 Cardiac septal defect, acquired: Secondary | ICD-10-CM

## 2013-03-28 DIAGNOSIS — R011 Cardiac murmur, unspecified: Secondary | ICD-10-CM

## 2013-03-28 DIAGNOSIS — Q210 Ventricular septal defect: Secondary | ICD-10-CM

## 2013-03-28 NOTE — Progress Notes (Signed)
Echocardiogram Performed. 

## 2013-04-02 ENCOUNTER — Emergency Department (INDEPENDENT_AMBULATORY_CARE_PROVIDER_SITE_OTHER)
Admission: EM | Admit: 2013-04-02 | Discharge: 2013-04-02 | Disposition: A | Discharge status: Home / Self Care | Payer: No Typology Code available for payment source | Source: Home / Self Care | Attending: Emergency Medicine | Admitting: Emergency Medicine

## 2013-04-02 ENCOUNTER — Encounter (HOSPITAL_COMMUNITY): Payer: Self-pay | Admitting: Emergency Medicine

## 2013-04-02 ENCOUNTER — Emergency Department (INDEPENDENT_AMBULATORY_CARE_PROVIDER_SITE_OTHER): Payer: No Typology Code available for payment source

## 2013-04-02 DIAGNOSIS — J40 Bronchitis, not specified as acute or chronic: Secondary | ICD-10-CM

## 2013-04-02 LAB — POCT RAPID STREP A: Streptococcus, Group A Screen (Direct): NEGATIVE

## 2013-04-02 MED ORDER — AZITHROMYCIN 250 MG PO TABS: 250.0000 mg | ORAL_TABLET | Freq: Every day | ORAL | Status: DC

## 2013-04-02 MED ORDER — BENZONATATE 200 MG PO CAPS: 200.0000 mg | ORAL_CAPSULE | Freq: Three times a day (TID) | ORAL | Status: DC | PRN

## 2013-04-02 NOTE — ED Provider Notes (Signed)
Medical screening examination/treatment/procedure(s) were performed by non-physician practitioner and as supervising physician I was immediately available for consultation/collaboration.  Mikaila Grunert, M.D.  Joie Hipps C Jackston [place], MD 04/02/13 2208 

## 2013-04-02 NOTE — ED Notes (Signed)
C/o  Sore throat and body aches x 2 days.  No relief with otc meds.  Denies fever, n/v/d

## 2013-04-02 NOTE — ED Provider Notes (Signed)
CSN: [id number]     Arrival date & time 04/02/13  1250 History   First MD Initiated Contact with Patient 04/02/13 1504     Chief Complaint  Patient presents with  . URI   (Consider location/radiation/quality/duration/timing/severity/associated sxs/prior Treatment) Patient is a 58 y.o. female presenting with URI. The history is provided by the patient. No language interpreter was used.  URI Presenting symptoms: congestion and cough   Severity:  Moderate Onset quality:  Gradual Duration:  2 days Timing:  Constant Progression:  Worsening Chronicity:  New Relieved by:  Nothing Worsened by:  Nothing tried Ineffective treatments:  None tried Associated symptoms: headaches   Risk factors: being elderly     Past Medical History  Diagnosis Date  . Small bowel obstruction 2013   Past Surgical History  Procedure Laterality Date  . Abdominal hysterectomy    . Cholecystectomy    . Abdominal hernia repair     History reviewed. No pertinent family history. History  Substance Use Topics  . Smoking status: Never Smoker   . Smokeless tobacco: Not on file  . Alcohol Use: Yes     Comment: sometimes   OB History   Grav Para Term Preterm Abortions TAB SAB Ect Mult Living                 Review of Systems  HENT: Positive for congestion.   Respiratory: Positive for cough.   Neurological: Positive for headaches.  All other systems reviewed and are negative.    Allergies  Review of patient's allergies indicates no known allergies.  Home Medications   Current Outpatient Rx  Name  Route  Sig  Dispense  Refill  . nitroGLYCERIN (NITROSTAT) 0.4 MG SL tablet   Sublingual   Place 1 tablet (0.4 mg total) under the tongue every 5 (five) minutes as needed for chest pain.   20 tablet   3   . ondansetron (ZOFRAN ODT) 4 MG disintegrating tablet   Oral   Take 1 tablet (4 mg total) by mouth every 8 (eight) hours as needed for nausea or vomiting.   10 tablet   0    BP 115/71  Pulse  78  Temp(Src) 99 F (37.2 C) (Oral)  Resp 16  SpO2 99% Physical Exam  Nursing note and vitals reviewed. Constitutional: She is oriented to person, place, and time. She appears well-developed and well-nourished.  HENT:  Head: Normocephalic.  Right Ear: External ear normal.  Left Ear: External ear normal.  Nose: Nose normal.  Mouth/Throat: Oropharynx is clear and moist.  Eyes: Conjunctivae and EOM are normal. Pupils are equal, round, and reactive to light.  Neck: Normal range of motion.  Cardiovascular: Normal rate and normal heart sounds.   Pulmonary/Chest: Effort normal.  Abdominal: Soft. She exhibits no distension.  Musculoskeletal: Normal range of motion.  Neurological: She is alert and oriented to person, place, and time.  Skin: Skin is warm.  Psychiatric: She has a normal mood and affect.    ED Course  Procedures (including critical care time) Labs Review Labs Reviewed  POCT RAPID STREP A ([facility] CARE ONLY)   Imaging Review Dg Chest 2 View  04/02/2013   CLINICAL DATA:  Cough  EXAM: CHEST  2 VIEW  COMPARISON:  Prior radiograph from 10/18/2012  FINDINGS: The cardiac and mediastinal silhouettes are stable in size and contour, and remain within normal limits.  The lungs are normally inflated. No airspace consolidation, pleural effusion, or pulmonary edema is identified. There is  no pneumothorax.  No acute osseous abnormality identified.  IMPRESSION: No active cardiopulmonary disease.   Electronically Signed   By: Rise MuBenjamin  McClintock M.D.   On: 04/02/2013 15:52     MDM   1. Bronchitis    Pt reports she was just getting over a previous resp illness.   Pt given RX for tessalon and zithromax.   Pt advised to recheck with her MD in 1 week   Elson AreasLeslie K Sofia, [place] JerseyPA-C 04/02/13 1617

## 2013-04-02 NOTE — Discharge Instructions (Signed)

## 2013-04-04 ENCOUNTER — Telehealth: Payer: Self-pay | Admitting: *Deleted

## 2013-04-04 LAB — CULTURE, GROUP A STREP

## 2013-04-04 NOTE — Telephone Encounter (Signed)
PT  AWARE OF  ECHO RESULTS PER  DR NISHAN  SMALL  VSD NO NEED  FOR  SURGERY   NO  PULMONARY    HYPERTENSION./CY

## 2013-04-05 ENCOUNTER — Telehealth: Payer: Self-pay | Admitting: Dietician

## 2013-04-05 NOTE — Telephone Encounter (Signed)
Her sister asked for assistance scheduling annual eye exam. Appointment made for 04-11-13 at 2:30 PM. Patient's sister notified by phone and letter

## 2013-04-09 ENCOUNTER — Telehealth: Payer: Self-pay | Admitting: Family Medicine

## 2013-04-09 NOTE — Telephone Encounter (Signed)
Pt's cough no better.  Instructed pt to call and make an appt to be seen tomorrow because we cant call in ANY meds without seeing patient first.  Radene [place], Tysen Roesler L, CMA

## 2013-04-09 NOTE — Telephone Encounter (Signed)
Pt called because she was seen at the [facility] and was given medication for her Bronchitis and she said it is not working and wanted to know if something else can be called in. Myriam Jacobsonjw

## 2013-04-10 ENCOUNTER — Ambulatory Visit (INDEPENDENT_AMBULATORY_CARE_PROVIDER_SITE_OTHER): Payer: No Typology Code available for payment source | Admitting: Family Medicine

## 2013-04-10 ENCOUNTER — Encounter: Payer: Self-pay | Admitting: Family Medicine

## 2013-04-10 ENCOUNTER — Telehealth: Payer: Self-pay | Admitting: Family Medicine

## 2013-04-10 VITALS — BP 117/81 | HR 70 | Temp 98.1°F | Ht 64.0 in | Wt 163.0 lb

## 2013-04-10 DIAGNOSIS — J309 Allergic rhinitis, unspecified: Secondary | ICD-10-CM

## 2013-04-10 DIAGNOSIS — B9789 Other viral agents as the cause of diseases classified elsewhere: Secondary | ICD-10-CM

## 2013-04-10 DIAGNOSIS — J069 Acute upper respiratory infection, unspecified: Secondary | ICD-10-CM

## 2013-04-10 MED ORDER — PHENYLEPH-CHLORPHEN-CODEINE 5-2-10 MG/5ML PO SYRP: 2.5000 mL | ORAL_SOLUTION | Freq: Three times a day (TID) | ORAL | Status: DC | PRN

## 2013-04-10 MED ORDER — FLUTICASONE PROPIONATE 50 MCG/ACT NA SUSP: 2.0000 | Freq: Every day | NASAL | Status: DC

## 2013-04-10 MED ORDER — GUAIFENESIN-CODEINE 100-10 MG/5ML PO SOLN: 5.0000 mL | Freq: Four times a day (QID) | ORAL | Status: DC | PRN

## 2013-04-10 NOTE — Progress Notes (Signed)
   Subjective:    Patient ID: Gershon MusselLee N Drees, female    DOB: [birth year], 58 y.o.   MRN: [id number]  HPI  58 year old F with a persistent cough. The patient was seen for this one week ago. At that time she was prescribed azithromycin and Tessalon Perles. She completed her course of antibiotics and is continuing to take the [employer] Perles without improvement.  URI Symptoms Cough: yes productive no Runny Nose: no Sore Throat: no Sinus Pressure: yes Shortness of Breath: no Fever/Chills: no Nausea/Vomiting; no Diarrhea: no  Course: this started about 10 days ago Treatments Tried: azithromycin, tessalon as above; nothing else  Exacerbating: nothing    Review of Systems Positive for night sweats x 1 No rashes or weight loss     Objective:   Physical Exam BP 117/81  Pulse 70  Temp(Src) 98.1 F (36.7 C) (Oral)  Ht 5\' 4"  (1.626 m)  Wt 163 lb (73.936 kg)  BMI 27.97 kg/m2  SpO2 99%  Gen: middle age  female, mildly ill appearing but non toxic, NAD, pleasant and conversant HEENT: NCAT, PERRLA, EOMI, OP clear and moist, no oropharyngeal exudate but erythema with few palatal petechiae present, no lymphadenopathy, no thyroid tenderness, enlargement, or nodules, neck with normal ROM, no meningismus, TM with bilateral serous effusion  CV: RRR, no m/r/g, no JVD or carotid bruits Pulm: normal WOB, CTA-B Abd: soft, NDNT, NABS, no organomegaly Extremities: no edema or joint tenderness Skin: warm, dry, no rashes   Wt Readings from Last 5 Encounters:  04/10/13 163 lb (73.936 kg)  02/17/13 167 lb 4 oz (75.864 kg)  01/22/13 163 lb (73.936 kg)  10/05/12 164 lb 3.2 oz (74.481 kg)  09/15/12 164 lb 9.6 oz (74.662 kg)        Assessment & Plan:  Pt will with viral URI symptoms likely complicated by allergies. No red flags for systemic process like auto-immune or malignant at this point. Will continue with supportive care of steroid nasal spray and cough syrup. F/u 3 days for lab work if not  improved.

## 2013-04-10 NOTE — Patient Instructions (Signed)
[name],   I think that you have a lingering viral infection. This could be worsened by allergies. Please take the nasal spray and cough syrup. If you are not better in 4 days, then please follow up for another evaluation and lab work.   Take Care,   Dr. Clinton SawyerWilliamson

## 2013-04-10 NOTE — Telephone Encounter (Signed)
Patient having trouble filling the cough syrup that was prescribed this morning. [facility] will not fill. [facility] [address]

## 2013-04-10 NOTE — Telephone Encounter (Signed)
Called in prescription for robitussin with codeine. Pt notified.

## 2013-04-20 ENCOUNTER — Ambulatory Visit (INDEPENDENT_AMBULATORY_CARE_PROVIDER_SITE_OTHER): Payer: Medicaid Other | Admitting: Internal Medicine

## 2013-04-20 ENCOUNTER — Encounter: Payer: Self-pay | Admitting: Internal Medicine

## 2013-04-20 VITALS — BP 141/86 | HR 102 | Temp 98.3°F | Ht 67.0 in | Wt 191.3 lb

## 2013-04-20 DIAGNOSIS — R040 Epistaxis: Secondary | ICD-10-CM

## 2013-04-20 DIAGNOSIS — I10 Essential (primary) hypertension: Secondary | ICD-10-CM

## 2013-04-20 DIAGNOSIS — E119 Type 2 diabetes mellitus without complications: Secondary | ICD-10-CM

## 2013-04-20 LAB — BASIC METABOLIC PANEL WITH GFR
BUN: 15 mg/dL (ref 6–23)
CALCIUM: 10 mg/dL (ref 8.4–10.5)
CO2: 30 meq/L (ref 19–32)
CREATININE: 0.83 mg/dL (ref 0.50–1.10)
Chloride: 101 mEq/L (ref 96–112)
GFR, Est African American: >89 mL/min
GFR, Est Non African American: 79 mL/min
Glucose, Bld: 164 mg/dL — ABNORMAL HIGH (ref 70–99)
Potassium: 3.6 mEq/L (ref 3.5–5.3)
Sodium: 141 mEq/L (ref 135–145)

## 2013-04-20 LAB — GLUCOSE, CAPILLARY: Glucose-Capillary: 223 mg/dL — ABNORMAL HIGH (ref 70–99)

## 2013-04-20 MED ORDER — LOSARTAN POTASSIUM 50 MG PO TABS: 50.0000 mg | ORAL_TABLET | Freq: Every day | ORAL | Status: DC

## 2013-04-20 NOTE — Assessment & Plan Note (Signed)
She denies recent nosebleeds.

## 2013-04-20 NOTE — Assessment & Plan Note (Signed)
BP Readings from Last 3 Encounters:  04/20/13 141/86  03/21/13 126/84  03/09/13 140/78    Lab Results  Component Value Date   NA 143 12/20/2012   K 3.5 12/20/2012   CREATININE 0.87 12/20/2012    Assessment: Blood pressure control:  Not controlled Progress toward BP goal:   Improved Comments: She is on HCTZ 37.5 mg daily, Toprol 25mg /24hr, lisinopril 5mg  daily  Plan: Medications:  continue toprol, decrease HCTZ to 25mg  daily, discontinue lisinopril 5mg  daily, start losartan 50mg . Pt verified medication changes by teach-back method with help from ASL interpreter.  Educational resources provided:   Self management tools provided:   Other plans: Pt to return in 1-2 weeks for blood pressure recheck. She may need losartan 100mg  daily for BP control. Checked BMET during this visit for renal fxn and K trending.

## 2013-04-20 NOTE — Progress Notes (Signed)
   Subjective:    Patient ID: Janice Massey, female    DOB: 04/24/1955, 58 y.o.   MRN: [id number]  HPI Janice Massey is a very pleasant 58 yr old woman with PMH of HTN, DM2, microalbuminuria, and deafness, who comes in accompanied by a sing language interpreter for follow up of her hypertension. During her last visit she was started on Lisinopril for her blood pressure and for her microalbuminuria. She has been taking Lisinopril faithfully but states that she has had a cough since starting this medication. She has continued to take hydrochlorothiazide 25mg  and is taking additional hydrochlorothiazide 12.5 mg daily which was prescribed to her during her last visit.  She was seen by her Opthalmologist 2 weeks ago and by her retina specialist one month ago and was told that everything was "normal" except that she needed new prescription glasses.  She had chest pain last month but was seen by Janice Massey, with repeat 2D echo that showed stable VSD and grade diastolic dysfunction--these results were discussed with her during this visit.   Review of Systems  Constitutional: Negative for fever, chills, diaphoresis, activity change, appetite change and fatigue.  Respiratory: Negative for cough, shortness of breath and wheezing.   Cardiovascular: Negative for chest pain, palpitations and leg swelling.  Gastrointestinal: Negative for abdominal pain.  Genitourinary: Negative for dysuria.  Neurological: Negative for dizziness and light-headedness.  Psychiatric/Behavioral: Negative for agitation.       Objective:   Physical Exam  Constitutional: She is oriented to person, place, and time. She appears well-nourished. No distress.  Communicating in sign language  Cardiovascular: Normal rate.   Pulmonary/Chest: Effort normal. No respiratory distress.  Neurological: She is alert and oriented to person, place, and time. Coordination normal.  Skin: Skin is warm and dry. She is not diaphoretic.  Psychiatric: She  has a normal mood and affect. Her behavior is normal.          Assessment & Plan:

## 2013-04-20 NOTE — Patient Instructions (Addendum)
-  Continue taking Hydrochlorothiazide 25 mg per day.  -STOP taking hydrochlorothiazide 12.5 mg daily.  -STOP taking lisinopril.  -Start taking Losartan 50mg  per day.  -Follow up with [place] in 1-2 weeks.   Thank you for bringing your medicines today. This helps [place] keep you safe from mistakes.

## 2013-04-23 NOTE — Progress Notes (Signed)
INTERNAL MEDICINE TEACHING ATTENDING ADDENDUM - Kambrey Hagger, MD: I reviewed and discussed at the time of visit with the resident Dr. Kennerly, the patient's medical history, physical examination, diagnosis and results of tests and treatment and I agree with the patient's care as documented. 

## 2013-04-30 NOTE — Addendum Note (Signed)
Addended by: Truddie Crumble on: 04/30/2013 02:53 PM   Modules accepted: Orders

## 2013-05-01 ENCOUNTER — Ambulatory Visit (HOSPITAL_COMMUNITY)
Admission: RE | Admit: 2013-05-01 | Discharge: 2013-05-01 | Disposition: A | Discharge status: Home / Self Care | Payer: Medicaid Other | Source: Ambulatory Visit | Attending: Internal Medicine | Admitting: Internal Medicine

## 2013-05-01 ENCOUNTER — Ambulatory Visit (INDEPENDENT_AMBULATORY_CARE_PROVIDER_SITE_OTHER): Payer: Medicaid Other | Admitting: Internal Medicine

## 2013-05-01 ENCOUNTER — Encounter: Payer: Self-pay | Admitting: Internal Medicine

## 2013-05-01 VITALS — BP 137/82 | HR 74 | Temp 98.2°F | Ht 67.0 in | Wt 191.8 lb

## 2013-05-01 DIAGNOSIS — E1129 Type 2 diabetes mellitus with other diabetic kidney complication: Secondary | ICD-10-CM

## 2013-05-01 DIAGNOSIS — R079 Chest pain, unspecified: Secondary | ICD-10-CM

## 2013-05-01 DIAGNOSIS — I10 Essential (primary) hypertension: Secondary | ICD-10-CM

## 2013-05-01 LAB — GLUCOSE, CAPILLARY: GLUCOSE-CAPILLARY: 110 mg/dL — AB (ref 70–99)

## 2013-05-01 MED ORDER — ASPIRIN 81 MG PO TABS: 81.0000 mg | ORAL_TABLET | Freq: Every day | ORAL | Status: DC

## 2013-05-01 NOTE — Patient Instructions (Addendum)
General Instructions: -Follow up with your nuclear stress test. Follow up with your Cardiologist.  -Start taking a aspirin 81mg  daily.  -If you have recurrent chest pain go to the nearest emergency room for further evaluation.  -Follow up with [place] in 2 months for diabetes care or sooner if you have other medical problems.   Thank you for bringing your medicines today. This helps [place] keep you safe from mistakes.   Treatment Goals:  Goals (1 Years of Data) as of 05/01/13         As of Today 04/20/13 03/21/13 03/09/13 12/20/12     Blood Pressure    . Blood Pressure < 140/90  137/82 141/86 126/84 140/78 130/82     Result Component    . HEMOGLOBIN A1C < 7.0    6.6  6.2    . LDL CALC < 100            Progress Toward Treatment Goals:  Treatment Goal 05/01/2013  Hemoglobin A1C at goal  Blood pressure at goal    Self Care Goals & Plans:  Self Care Goal 05/01/2013  Manage my medications -  Monitor my health check my feet daily  Eat healthy foods eat baked foods instead of fried foods; drink diet soda or water instead of juice or soda  Be physically active -  Meeting treatment goals -    Home Blood Glucose Monitoring 05/01/2013  Check my blood sugar no home glucose monitoring  When to check my blood sugar -     Care Management & Community Referrals:  Referral 05/01/2013  Referrals made for care management support none needed

## 2013-05-03 DIAGNOSIS — R079 Chest pain, unspecified: Secondary | ICD-10-CM | POA: Insufficient documentation

## 2013-05-03 NOTE — Assessment & Plan Note (Signed)
BP Readings from Last 3 Encounters:  05/01/13 137/82  04/20/13 141/86  03/21/13 126/84    Lab Results  Component Value Date   NA 141 04/20/2013   K 3.6 04/20/2013   CREATININE 0.83 04/20/2013    Assessment: Blood pressure control: controlled Progress toward BP goal:  at goal Comments: She is on Toprol XL, losartan 50mg  daily (lisinopril discontinued during her last visit due to cough)  Plan: Medications:  continue current medications Educational resources provided:   Self management tools provided:   Other plans: Follow up in 2 months.

## 2013-05-03 NOTE — Assessment & Plan Note (Addendum)
She is on losartan for proteinuria, on metformin 1000mg  BID, assures compliance, CBG of 110 during this visit. Will follow up in June for diabetes care. Had eye exam already and was told that she does not have diabetic retinophaty, we are awaiting records to be faxed to the [facility].

## 2013-05-03 NOTE — Assessment & Plan Note (Addendum)
She has had substernal chest pain for the past 2 months, off and on,  with sharp, crushing pain lasting for 5 minutes, present only with brisk walking, resolves with rest, with no apparent arm association or diaphoresis. This is concerning of stable angina.  Ordered EKG during this visit which showed q waves in inferior leads and lateral leads. Pt was not aware of EKG changes and does not recall having chest pain in the past. She does have a hx of VSD but it is unclear if she has had silent MI. Last 2D echo with stable VSD and preserved EF with grade 1 diastolic dysfunction--she denies orthopnea, PND, SOB, with no volume overload on physical exam.  -Ordered nuclear stress test, pt to follow up with her Cardiologist next week.  -Pt to start taking ASA 81 mg daily.  -Pt advised to go to nearest ED if she experiences CP again.

## 2013-05-03 NOTE — Progress Notes (Signed)
   Subjective:    Patient ID: Janice Massey, female    DOB: 07-04-1955, 58 y.o.   MRN: [id number]  Diabetes Pertinent negatives for hypoglycemia include no dizziness or headaches. Associated symptoms include chest pain. Pertinent negatives for diabetes include no fatigue.   Janice Massey is a 58 yo woman with PMH significant for bilateral deafness, DM2, and HTN who comes in accompanied by a sign language interpreter for follow up visit for her blood pressure. In addition, she complains of chest pain with walking for the past 2 months with most recent episode this past week. She states that the chest pain is usually felt as a sharp, crushing pain, in the substernal chest, does not radiate to her arm, not associated with diaphoresis or SOB, lasts for 5 minutes and resolves with rest. The chest pain occurs off and on, about 2-3 times per month.  She denies chest pain presently.  Her cough has completely resolved after stopping lisinopril. She reports no dizziness or lightheadedness with losartan.    Review of Systems  Constitutional: Negative for fever, chills, diaphoresis, activity change, appetite change, fatigue and unexpected weight change.  HENT: Negative for nosebleeds and rhinorrhea.   Respiratory: Negative for cough, shortness of breath and wheezing.   Cardiovascular: Positive for chest pain. Negative for palpitations and leg swelling.  Gastrointestinal: Negative for abdominal pain.  Genitourinary: Negative for dysuria.  Neurological: Negative for dizziness, light-headedness and headaches.  Psychiatric/Behavioral: Negative for agitation.       Objective:   Physical Exam  Nursing note and vitals reviewed. Constitutional: She is oriented to person, place, and time. She appears well-developed and well-nourished. No distress.  Communicating in sign language   Cardiovascular: Normal rate and regular rhythm.   Pulmonary/Chest: Effort normal and breath sounds normal. No respiratory distress.  She has no wheezes. She has no rales. She exhibits no tenderness.  Abdominal: Soft. She exhibits no distension.  Musculoskeletal: She exhibits no edema and no tenderness.  Neurological: She is alert and oriented to person, place, and time.  Skin: Skin is warm and dry. She is not diaphoretic.  Psychiatric: She has a normal mood and affect. Her behavior is normal.          Assessment & Plan:

## 2013-05-04 NOTE — Progress Notes (Signed)
INTERNAL MEDICINE TEACHING ATTENDING ADDENDUM - Amad Mau, MD: I reviewed and discussed at the time of visit with the resident Dr. Kennerly, the patient's medical history, physical examination, diagnosis and results of tests and treatment and I agree with the patient's care as documented. 

## 2013-05-11 ENCOUNTER — Telehealth: Payer: Self-pay | Admitting: *Deleted

## 2013-05-11 NOTE — Telephone Encounter (Signed)
Small restrictive VSD F/u echo in a year ----- Message ----- From: Baxter Flattery Sent: 04/12/2013 2:52 PM To: Josue Hector, MD Echo report  PT NOTIFIED./CY

## 2013-05-17 ENCOUNTER — Ambulatory Visit (HOSPITAL_COMMUNITY): Payer: Medicaid Other | Attending: Cardiovascular Disease | Admitting: Radiology

## 2013-05-17 VITALS — BP 131/103 | HR 82 | Ht 67.0 in | Wt 184.0 lb

## 2013-05-17 DIAGNOSIS — R42 Dizziness and giddiness: Secondary | ICD-10-CM

## 2013-05-17 DIAGNOSIS — R079 Chest pain, unspecified: Secondary | ICD-10-CM

## 2013-05-17 MED ORDER — TECHNETIUM TC 99M SESTAMIBI GENERIC - CARDIOLITE
33.0000 | Freq: Once | INTRAVENOUS | Status: AC | PRN
  Administered 2013-05-17: 33 via INTRAVENOUS

## 2013-05-17 MED ORDER — TECHNETIUM TC 99M SESTAMIBI GENERIC - CARDIOLITE
11.0000 | Freq: Once | INTRAVENOUS | Status: AC | PRN
  Administered 2013-05-17: 11 via INTRAVENOUS

## 2013-05-17 MED ORDER — REGADENOSON 0.4 MG/5ML IV SOLN
0.4000 mg | Freq: Once | INTRAVENOUS | Status: AC
  Administered 2013-05-17: 0.4 mg via INTRAVENOUS

## 2013-05-17 NOTE — Progress Notes (Signed)
[facility] [address] [place], [place] [address] [phone]    Cardiology Nuclear Med Study  Janice Massey is a 58 y.o. female     MRN : [id number]     DOB: June 07, 1955  Procedure Date: 05/17/2013  Nuclear Med Background Indication for Stress Test:  Evaluation for Ischemia and Abnormal EKG History:  No known CAD, Cath (normal) 1997, Echo 2015 EF 55-60% MPI ~7-8 yrs ago, COPD Cardiac Risk Factors: Family History - CAD, Hypertension, Lipids and NIDDM  Symptoms:  Chest Pain (last date of chest discomfort was one month ago), Dizziness and Palpitations   Nuclear Pre-Procedure Caffeine/Decaff Intake:  None>12hrs NPO After: 11:00pm   Lungs:  clear O2 Sat: 96% on room air. IV 0.9% NS with Angio Cath:  22g  IV Site: R Wrist x 1, tolerated well IV Started by:  Irven Baltimore, RN  Chest Size (in):  44 Cup Size: D  Height: 5\' 7"  (1.702 m)  Weight:  184 lb (83.462 kg)  BMI:  Body mass index is 28.81 kg/(m^2). Tech Comments:  No medications (toprol, or metformin) this am. Irven Baltimore, RN.    Nuclear Med Study 1 or 2 day study: 1 day  Stress Test Type:  Carlton Adam  Reading MD: N/A  Order Authorizing Provider:  Jenkins Rouge, MD, and Adele Barthel, MD  Resting Radionuclide: Technetium 82m Sestamibi  Resting Radionuclide Dose: 11.0 mCi   Stress Radionuclide:  Technetium 54m Sestamibi  Stress Radionuclide Dose: 33.0 mCi           Stress Protocol Rest HR: 82 Stress HR: 114  Rest BP: 131/103 Stress BP: 135/95  Exercise Time (min): n/a METS: n/a           Dose of Adenosine (mg):  n/a Dose of Lexiscan: 0.4 mg  Dose of Atropine (mg): n/a Dose of Dobutamine: n/a mcg/kg/min (at max HR)  Stress Test Technologist: Glade Lloyd, BS-ES  Nuclear Technologist:  Charlton Amor, CNMT     Rest Procedure:  Myocardial perfusion imaging was performed at rest 45 minutes following the intravenous administration of Technetium 20m Sestamibi. Rest ECG: NSR, Inf. Q  waves . no ST changes   Stress Procedure:  The patient received IV Lexiscan 0.4 mg over 15-seconds.  Technetium 11m Sestamibi injected at 30-seconds.  Quantitative spect images were obtained after a 45 minute delay.  During the infusion of Lexiscan, the patient complained of chest burning, heart beating fast and slight SOB.  Symptoms completely resolved in recovery.  Stress ECG: No significant change from baseline ECG  QPS Raw Data Images:  There is a breast shadow that accounts for the anterior attenuation. Stress Images:  There is decreased uptake in the apex. Rest Images:  There is decreased uptake in the apex. Subtraction (SDS):  No evidence of ischemia. Transient Ischemic Dilatation (Normal <1.22):  1.05 Lung/Heart Ratio (Normal <0.45):  0.25  Quantitative Gated Spect Images QGS EDV:  97 ml QGS ESV:  36 ml  Impression Exercise Capacity:  Lexiscan with no exercise. BP Response:  Normal blood pressure response. Clinical Symptoms:  No significant symptoms noted. ECG Impression:  No significant ST segment change suggestive of ischemia. Comparison with Prior Nuclear Study: No images to compare  Overall Impression:  Low risk stress nuclear study .  There is a small fixed apical defect that is likely due to breast artifact.  there is no ischemia.  The LV apex contracts well.  .  LV Ejection Fraction: 63%.  LV Wall Motion:  NL LV Function; NL Wall Motion.    Thayer Headings, Brooke Bonito., MD, [facility] 05/17/2013, 4:16 PM  [address],  [place] Pager [phone]

## 2013-05-20 ENCOUNTER — Other Ambulatory Visit: Payer: Self-pay | Admitting: Internal Medicine

## 2013-05-21 NOTE — Progress Notes (Signed)
Quick Note:  Patient notified of Myocardial Perfusion study results - "Normal myovue", per Dr. Johnsie Cancel. Patient verbalized understanding and appreciation for phone call. Denies further questions or concerns. ______

## 2013-05-23 ENCOUNTER — Ambulatory Visit: Payer: Medicaid Other | Admitting: Cardiovascular Disease

## 2013-05-29 ENCOUNTER — Encounter (INDEPENDENT_AMBULATORY_CARE_PROVIDER_SITE_OTHER): Payer: Self-pay

## 2013-05-29 ENCOUNTER — Ambulatory Visit (INDEPENDENT_AMBULATORY_CARE_PROVIDER_SITE_OTHER): Payer: Medicaid Other | Admitting: Cardiovascular Disease

## 2013-05-29 VITALS — BP 142/88 | HR 80 | Ht 67.0 in | Wt 185.8 lb

## 2013-05-29 DIAGNOSIS — Q210 Ventricular septal defect: Secondary | ICD-10-CM | POA: Insufficient documentation

## 2013-05-29 DIAGNOSIS — I10 Essential (primary) hypertension: Secondary | ICD-10-CM

## 2013-05-29 DIAGNOSIS — E785 Hyperlipidemia, unspecified: Secondary | ICD-10-CM

## 2013-05-29 DIAGNOSIS — E1129 Type 2 diabetes mellitus with other diabetic kidney complication: Secondary | ICD-10-CM

## 2013-05-29 NOTE — Assessment & Plan Note (Signed)
Well controlled.  Continue current medications and low sodium Dash type diet.    

## 2013-05-29 NOTE — Progress Notes (Signed)
Patient ID: Janice Massey, female   DOB: 1955/06/16, 58 y.o.   MRN: [id number] 58 yo deaf patient seen by Dr Rollene Fare in past. History of presumed VSD. No dyspnea palpitations or syncope No chest pain. She has not had SBE.  Echo in system from 2014 does not mention VSD Long standing DM HTN and elevated lipids On appropriate Rx Not clear why she is not on ACE  Has hearing impaired services at home   Echo 03/28/13 Study Conclusions  - Left ventricle: The cavity size was normal. Wall thickness was normal. Systolic function was normal. The estimated ejection fraction was in the range of 55% to 60%. Wall motion was normal; there were no regional wall motion abnormalities. Doppler parameters are consistent with abnormal left ventricular relaxation (grade 1 diastolic dysfunction). - Ventricular septum: There was a small, restrictive perimembranous VSD. - Aortic valve: There was no stenosis. - Mitral valve: No significant regurgitation. - Left atrium: The atrium was mildly dilated. - Right ventricle: The cavity size was normal. Systolic function was normal. - Tricuspid valve: Peak RV-RA gradient: 42mm Hg (S). - Pulmonary arteries: PA peak pressure: 21mm Hg (S). - Inferior vena cava: The vessel was normal in size; the respirophasic diameter changes were in the normal range (= 50%); findings are consistent with normal central venous pressure. Impressions:  - Normal LV size and systolic function, EF 62-37%. Normal RV size and systolic function. There is a small, restrictive perimembranous VSD.   Myovue 5/15  Breast artifact otherwise normal  EF 63%     Goes to the dentist 2x/year    ROS: Denies fever, malais, weight loss, blurry vision, decreased visual acuity, cough, sputum, SOB, hemoptysis, pleuritic pain, palpitaitons, heartburn, abdominal pain, melena, lower extremity edema, claudication, or rash.  All other systems reviewed and negative  General: Affect appropriate Healthy:   appears stated age 58: normal Neck supple with no adenopathy JVP normal no bruits no thyromegaly Lungs clear with no wheezing and good diaphragmatic motion Heart:  S1/S2 VSD  Murmur not as loud as expected , no rub, gallop or click PMI normal Abdomen: benighn, BS positve, no tenderness, no AAA no bruit.  No HSM or HJR Distal pulses intact with no bruits No edema Neuro non-focal Skin warm and dry No muscular weakness   Current Outpatient Prescriptions  Medication Sig Dispense Refill  . antipyrine-benzocaine (AURALGAN) otic solution Place 3 drops into both ears every 2 (two) hours as needed for pain.  10 mL  0  . aspirin 81 MG tablet Take 1 tablet (81 mg total) by mouth daily.  30 tablet  11  . BD ULTRA-FINE LANCETS lancets To test blood glucose daily       . Blood Glucose Monitoring Suppl (FREESTYLE LITE) DEVI Check blood glucose once daily before breakfast or as directed       . glucose blood (FREESTYLE LITE) test strip To test blood sugar one time daily       . losartan (COZAAR) 50 MG tablet Take 1 tablet (50 mg total) by mouth daily.  30 tablet  2  . metFORMIN (GLUCOPHAGE) 1000 MG tablet TAKE 1 TABLET BY MOUTH TWICE DAILY  180 tablet  1  . metoprolol succinate (TOPROL-XL) 25 MG 24 hr tablet Take 1 tablet (25 mg total) by mouth daily.  30 tablet  6  . pravastatin (PRAVACHOL) 40 MG tablet Take 1 tablet (40 mg total) by mouth daily.  90 tablet  3   No current facility-administered medications for  this visit.    Allergies  Codeine; Lisinopril; and Penicillins  Electrocardiogram:  4/28  SR rate 89 ? Old ILMI   Assessment and Plan

## 2013-05-29 NOTE — Assessment & Plan Note (Signed)
Small and restrictive no need for SBE  F/U dentist q 6 months  No RV dilatation or pulmonary hypertension  Murmur softer than expected Stable no need for TEE or cath

## 2013-05-29 NOTE — Assessment & Plan Note (Signed)
Cholesterol is at goal.  Continue current dose of statin and diet Rx.  No myalgias or side effects.  F/U  LFT's in 6 months. Lab Results  Component Value Date   LDLCALC 76 09/20/2012

## 2013-05-29 NOTE — Assessment & Plan Note (Signed)
Discussed low carb diet.  Target hemoglobin A1c is 6.5 or less.  Continue current medications.  

## 2013-05-29 NOTE — Patient Instructions (Signed)
Your physician wants you to follow-up in: YEAR WITH DR NISHAN  You will receive a reminder letter in the mail two months in advance. If you don't receive a letter, please call our office to schedule the follow-up appointment.  Your physician recommends that you continue on your current medications as directed. Please refer to the Current Medication list given to you today. 

## 2013-06-04 ENCOUNTER — Other Ambulatory Visit: Payer: Self-pay

## 2013-06-04 DIAGNOSIS — Z1231 Encounter for screening mammogram for malignant neoplasm of breast: Secondary | ICD-10-CM

## 2013-06-15 ENCOUNTER — Ambulatory Visit: Payer: Self-pay

## 2013-06-22 ENCOUNTER — Ambulatory Visit: Payer: Self-pay

## 2013-06-29 ENCOUNTER — Ambulatory Visit: Payer: Self-pay

## 2013-06-30 ENCOUNTER — Other Ambulatory Visit: Payer: Self-pay | Admitting: Internal Medicine

## 2013-07-05 ENCOUNTER — Telehealth: Payer: Self-pay | Admitting: Internal Medicine

## 2013-07-05 ENCOUNTER — Ambulatory Visit (HOSPITAL_COMMUNITY)
Admission: RE | Admit: 2013-07-05 | Discharge: 2013-07-05 | Disposition: A | Discharge status: Home / Self Care | Payer: Medicaid Other | Source: Ambulatory Visit | Attending: Internal Medicine | Admitting: Internal Medicine

## 2013-07-05 ENCOUNTER — Encounter: Payer: Self-pay | Admitting: Internal Medicine

## 2013-07-05 ENCOUNTER — Ambulatory Visit (INDEPENDENT_AMBULATORY_CARE_PROVIDER_SITE_OTHER): Payer: Medicaid Other | Admitting: Internal Medicine

## 2013-07-05 VITALS — BP 134/87 | HR 91 | Temp 97.5°F | Ht 67.0 in | Wt 191.7 lb

## 2013-07-05 DIAGNOSIS — R6884 Jaw pain: Secondary | ICD-10-CM

## 2013-07-05 DIAGNOSIS — I10 Essential (primary) hypertension: Secondary | ICD-10-CM

## 2013-07-05 DIAGNOSIS — M25572 Pain in left ankle and joints of left foot: Secondary | ICD-10-CM

## 2013-07-05 DIAGNOSIS — M25473 Effusion, unspecified ankle: Secondary | ICD-10-CM | POA: Insufficient documentation

## 2013-07-05 DIAGNOSIS — M25476 Effusion, unspecified foot: Principal | ICD-10-CM | POA: Insufficient documentation

## 2013-07-05 DIAGNOSIS — E1129 Type 2 diabetes mellitus with other diabetic kidney complication: Secondary | ICD-10-CM

## 2013-07-05 DIAGNOSIS — M7730 Calcaneal spur, unspecified foot: Secondary | ICD-10-CM | POA: Diagnosis not present

## 2013-07-05 DIAGNOSIS — M25579 Pain in unspecified ankle and joints of unspecified foot: Secondary | ICD-10-CM

## 2013-07-05 LAB — COMPLETE METABOLIC PANEL WITH GFR
ALBUMIN: 4.5 g/dL (ref 3.5–5.2)
ALT: 18 U/L (ref 0–35)
AST: 20 U/L (ref 0–37)
Alkaline Phosphatase: 53 U/L (ref 39–117)
BILIRUBIN TOTAL: 0.7 mg/dL (ref 0.2–1.2)
BUN: 12 mg/dL (ref 6–23)
CO2: 29 meq/L (ref 19–32)
Calcium: 9.5 mg/dL (ref 8.4–10.5)
Chloride: 102 mEq/L (ref 96–112)
Creat: 0.74 mg/dL (ref 0.50–1.10)
GFR, Est African American: >89 mL/min
GFR, Est Non African American: >89 mL/min
Glucose, Bld: 113 mg/dL — ABNORMAL HIGH (ref 70–99)
POTASSIUM: 3.9 meq/L (ref 3.5–5.3)
Sodium: 138 mEq/L (ref 135–145)
Total Protein: 7 g/dL (ref 6.0–8.3)

## 2013-07-05 LAB — POCT GLYCOSYLATED HEMOGLOBIN (HGB A1C): HEMOGLOBIN A1C: 6.4

## 2013-07-05 LAB — GLUCOSE, CAPILLARY: GLUCOSE-CAPILLARY: 145 mg/dL — AB (ref 70–99)

## 2013-07-05 LAB — C-REACTIVE PROTEIN: CRP: <0.5 mg/dL (ref <0.60)

## 2013-07-05 NOTE — Progress Notes (Deleted)
Patient ID: Janice Massey, female   DOB: May 26, 1955, 58 y.o.   MRN: [id number]

## 2013-07-05 NOTE — Patient Instructions (Signed)
1. Please return to clinic in 3 months for follow-up.  I will call you if there are abnormalities on xray or with labs.   2. Please take all medications as prescribed.     3. If you have worsening of your symptoms or new symptoms arise, please call the clinic ([phone]), or go to the ER immediately if symptoms are severe.

## 2013-07-05 NOTE — Assessment & Plan Note (Signed)
Likely sprain.  No swelling.  She is able to bear weight.  There is point tenderness to lateral ankle so I will send her for xray to r/o fracture.

## 2013-07-05 NOTE — Assessment & Plan Note (Addendum)
Unilateral headache and jaw pain, intensified by eating.  No visual symptoms.  Describes a few episodes of left shoulder weakness while brushing hair which has resolved.  Headache/jaw pain could be related to teeth (she has fillings on the left side and reports occasional tooth pain), however she was recently evaluated by dentist and there are no new cavities.  Possibly TMJ.  Must consider GCA in differential given jaw claudication symptoms. - labs today: ESR, CRP and CMP - follow-up interval will depend on lab results

## 2013-07-05 NOTE — Assessment & Plan Note (Signed)
Lab Results  Component Value Date   HGBA1C 6.4 07/05/2013   HGBA1C 6.6 03/21/2013   HGBA1C 6.2 12/20/2012     Assessment: Diabetes control:  well controlled Progress toward A1C goal:   at goal Comments:   Plan: Medications:  continue current medications:  Metformin 1000mg  BID Home glucose monitoring: none Frequency:   Timing:   Instruction/counseling given: reminded to get eye exam  Educational resources provided:   Self management tools provided:   Other plans: return to clinic in 3 months to check HgbA1c

## 2013-07-05 NOTE — Progress Notes (Signed)
   Subjective:    Patient ID: Janice Massey, female    DOB: 04-21-1955, 58 y.o.   MRN: [id number]  HPI Comments: Janice Massey is a 58 year old woman with a PMH of deafness, HTN, VSD, DM type 2, HLD and GERD.  She presents with c/o left ankle pain x 2 months.  Pain reports landing wrong on her left foot and the pain has been present since then.  It is 7/10, sharp and worse at night.  She has not tried any ice or OTC pain relievers.  She is wearing flat shoes.    She also reports left sided headache and left jaw/face pain for several month.  The pain is worse after eating and at night.  She has fillings in her left teeth and has been to the dentist recently with no new cavities found.  She denies loss of vision or change in vision.  She experienced left shoulder/arm weakness while brushing her hair, but none on the right or lower limbs.  This shoulder weakness was transient and has resolved.    Please see A&P for status of chronic medical conditions.     Review of Systems  Constitutional: Negative for fever and appetite change.  Eyes: Negative for visual disturbance.  Respiratory: Negative for shortness of breath.   Cardiovascular: Negative for chest pain.  Gastrointestinal: Negative for nausea, vomiting, diarrhea and blood in stool.  Genitourinary: Negative for dysuria.  Neurological: Positive for weakness. Negative for dizziness.       Noticed some left shoulder weakness which has resolved.       Objective:   Physical Exam  Vitals reviewed. Constitutional: She is oriented to person, place, and time. She appears well-developed. No distress.  HENT:  Head: Normocephalic and atraumatic.  Mouth/Throat: No oropharyngeal exudate.  The right side of her face is TTP from the temple down to the jaw line.  Left side is not tender.  Eyes: EOM are normal. Pupils are equal, round, and reactive to light.  Cardiovascular: Normal rate, regular rhythm and normal heart sounds.  Exam reveals no gallop and  no friction rub.   No murmur heard. Pulmonary/Chest: Effort normal and breath sounds normal. No respiratory distress. She has no wheezes. She has no rales.  Abdominal: Soft. Bowel sounds are normal. She exhibits no distension. There is no tenderness.  Musculoskeletal: Normal range of motion. She exhibits no edema and no tenderness.  Upper and lower extremity strength is equal and intact.  Neurological: She is alert and oriented to person, place, and time. No cranial nerve deficit.  Skin: Skin is warm. She is not diaphoretic.  Psychiatric: She has a normal mood and affect. Her behavior is normal.          Assessment & Plan:  Please see problem based assessment and plan.

## 2013-07-05 NOTE — Assessment & Plan Note (Signed)
BP Readings from Last 3 Encounters:  07/05/13 134/87  05/29/13 142/88  05/17/13 131/103    Lab Results  Component Value Date   NA 141 04/20/2013   K 3.6 04/20/2013   CREATININE 0.83 04/20/2013    Assessment: Blood pressure control:  well controlled Progress toward BP goal:   at goal Comments:   Plan: Medications:  continue current medications Educational resources provided:   Self management tools provided:   Other plans:

## 2013-07-05 NOTE — Telephone Encounter (Signed)
I called Ms. Bussie's sister (with her prior approval) and informed her of the ankle xray results.  Labs are still pending.

## 2013-07-06 LAB — SEDIMENTATION RATE: Sed Rate: 6 mm/hr (ref 0–22)

## 2013-07-09 NOTE — Progress Notes (Signed)
Case discussed with Dr. Wilson at the time of the visit.  We reviewed the resident's history and exam and pertinent patient test results.  I agree with the assessment, diagnosis, and plan of care documented in the resident's note. 

## 2013-07-12 ENCOUNTER — Telehealth: Payer: Self-pay | Admitting: Internal Medicine

## 2013-07-12 NOTE — Telephone Encounter (Signed)
I called to inform Ms. Sweetser's sister (with her prior approval) of her normal lab results.  I asked her to have her sister return to see me if she continues to have symptoms in the next 2-3 weeks.  Otherwise, I will see her at her appointment in Oct 2015.

## 2013-07-23 ENCOUNTER — Telehealth: Payer: Self-pay | Admitting: Family Medicine

## 2013-07-23 NOTE — Telephone Encounter (Signed)
Pt called and would like Dr. Bradshaw to call her. jw °

## 2013-07-23 NOTE — Telephone Encounter (Signed)
Patient peed earlier today, no pain. Had a BM at approximately 4:30pm and saw blood mixed in with the stool. Was not straining. Has not had blood in the past. Has also had some abdominal cramping. Denies fevers or chills. Otherwise felt like her normal self. Had a hysterectomy.  Denies dizziness, SOB, or chest pain.  Has not had any further bloody BMs at this time Advised calling office tomorrow morning to see Dr. Ermalinda MemosBradshaw vs going to the ED. Pt aware of reasons to call 911 Will attempt to use miralax and avoid heavy and fatty foods at this time and plans to call [facility] tmw  [facility], MD

## 2013-07-26 ENCOUNTER — Ambulatory Visit (INDEPENDENT_AMBULATORY_CARE_PROVIDER_SITE_OTHER): Payer: No Typology Code available for payment source | Admitting: Family Medicine

## 2013-07-26 ENCOUNTER — Encounter: Payer: Self-pay | Admitting: Family Medicine

## 2013-07-26 ENCOUNTER — Ambulatory Visit: Payer: Medicaid Other | Admitting: Family Medicine

## 2013-07-26 VITALS — BP 114/78 | HR 85 | Temp 98.2°F | Ht 64.0 in | Wt 166.0 lb

## 2013-07-26 DIAGNOSIS — K921 Melena: Secondary | ICD-10-CM | POA: Insufficient documentation

## 2013-07-26 LAB — POCT HEMOGLOBIN: HEMOGLOBIN: 12 g/dL — AB (ref 12.2–16.2)

## 2013-07-26 NOTE — Progress Notes (Signed)
Patient ID: Brandi Mendez, female   DOB: [birth year], 58 y.o.   MRN: [id number]  Kevin Fenton, MD Phone: [phone]  Subjective:  Chief complaint-noted  Pt Here for bloody stool  Patient states that she was constipated for last 2-3 days. One day ago she had a bowel movement with a significant amount of blood in the toilet, in the stool, and on the toilet paper after a bowel movement. She states that since then she's felt a little bit tired but not profoundly weak or fatigued.  She stools once per 2-3 days and frequently has to strain at stool.   She's had a colonoscopy previously the care of her how many years it's been since then, she remembers vaguely that they mentioned a polyp at that time.  She denies any rectal pain   She is eating normally and tolerating fluids.  ROS-  No fevers, chills sweats, + constipation  Otherwise per HPI  Past Medical History Patient Active Problem List   Diagnosis Date Noted  . Viral illness 01/22/2013  . Dysuria 10/05/2012  . Chest pain 10/05/2012  . Orthostatic hypotension 10/05/2012  . Abdominal pain, unspecified site 08/16/2012  . Nausea alone 08/16/2012  . Periauricular adenopathy 07/28/2012  . Dyspepsia 06/01/2012  . UTI (lower urinary tract infection) 02/04/2012  . Vaginal discharge 09/19/2010  . VAGINITIS 08/19/2009  . HEARTBURN 08/19/2009  . BREAST PAIN, LEFT 04/16/2009  . SMALL BOWEL OBSTRUCTION 03/25/2009  . CONSTIPATION 01/13/2009  . HYPERTRIGLYCERIDEMIA, MILD 12/26/2006  . OBESITY, UNSPECIFIED 12/26/2006    Medications- reviewed and updated Current Outpatient Prescriptions  Medication Sig Dispense Refill  . azithromycin (ZITHROMAX) 250 MG tablet Take 1 tablet (250 mg total) by mouth daily. Take first 2 tablets together, then 1 every day until finished.  6 tablet  0  . benzonatate (TESSALON) 200 MG capsule Take 1 capsule (200 mg total) by mouth 3 (three) times daily as needed for cough.  20 capsule  0  . fluticasone  (FLONASE) 50 MCG/ACT nasal spray Place 2 sprays into both nostrils daily.  16 g  6  . guaiFENesin-codeine 100-10 MG/5ML syrup Take 5 mLs by mouth every 6 (six) hours as needed for cough.  120 mL  0  . nitroGLYCERIN (NITROSTAT) 0.4 MG SL tablet Place 1 tablet (0.4 mg total) under the tongue every 5 (five) minutes as needed for chest pain.  20 tablet  3  . ondansetron (ZOFRAN ODT) 4 MG disintegrating tablet Take 1 tablet (4 mg total) by mouth every 8 (eight) hours as needed for nausea or vomiting.  10 tablet  0  . [DISCONTINUED] omeprazole (PRILOSEC) 20 MG capsule Take 1 capsule (20 mg total) by mouth daily. for heartburn  30 capsule  0   No current facility-administered medications for this visit.    Objective: BP 114/78  Pulse 85  Temp(Src) 98.2 F (36.8 C) (Oral)  Ht 5\' 4"  (1.626 m)  Wt 166 lb (75.297 kg)  BMI 28.48 kg/m2  SpO2 98% Gen: NAD, alert, cooperative with exam HEENT: NCAT CV: RRR, good S1/S2, no murmur Resp: CTABL, no wheezes, non-labored Ext: No edema, warm Neuro: Alert and oriented, No gross deficits Rectal- normal tone, no palpable hemmrhoid internally, no external hemms, Tenderness with DRE but no discrete fissures seen.     Assessment/Plan:  Bloody stool Likely from bleeding internal hemmrihoid vs anal fissure Guiac negative Hgb 12.0 Hemmocult X 3 for colon cancer screening, given [facility] number for financial assistance program for GI eval.  Recommend  metamucel, and miralax BID X 3 days then qd until follow up Follow up as needed, provided red flags for rectal bleeding

## 2013-07-26 NOTE — Assessment & Plan Note (Addendum)
Likely from bleeding internal hemmrihoid vs anal fissure Guiac negative Hgb 12.0 Hemmocult X 3 for colon cancer screening, given [facility] number for financial assistance program for GI eval.  Recommend metamucel, and miralax BID X 3 days then qd until follow up Follow up as needed, provided red flags for rectal bleeding

## 2013-07-26 NOTE — Patient Instructions (Signed)
Rectal Bleeding °Rectal bleeding is when blood passes out of the anus. It is usually a sign that something is wrong. It may not be serious, but it should always be evaluated. Rectal bleeding may present as bright red blood or extremely dark stools. The color may range from dark red or maroon to black (like tar). It is important that the cause of rectal bleeding be identified so treatment can be started and the problem corrected. °CAUSES  °· Hemorrhoids. These are enlarged (dilated) blood vessels or veins in the anal or rectal area. °· Fistulas. These are abnormal, burrowing channels that usually run from inside the rectum to the skin around the anus. They can bleed. °· Anal fissures. This is a tear in the tissue of the anus. Bleeding occurs with bowel movements. °· Diverticulosis. This is a condition in which pockets or sacs project from the bowel wall. Occasionally, the sacs can bleed. °· Diverticulitis. This is an infection involving diverticulosis of the colon. °· Proctitis and colitis. These are conditions in which the rectum, colon, or both, can become inflamed and pitted (ulcerated). °· Polyps and cancer. Polyps are non-cancerous (benign) growths in the colon that may bleed. Certain types of polyps turn into cancer. °· Protrusion of the rectum. Part of the rectum can project from the anus and bleed. °· Certain medicines. °· Intestinal infections. °· Blood vessel abnormalities. °HOME CARE INSTRUCTIONS °· Eat a high-fiber diet to keep your stool soft. °· Limit activity. °· Drink enough fluids to keep your urine clear or pale yellow. °· Warm baths may be useful to soothe rectal pain. °· Follow up with your caregiver as directed. °SEEK IMMEDIATE MEDICAL CARE IF: °· You develop increased bleeding. °· You have black or dark red stools. °· You vomit blood or material that looks like coffee grounds. °· You have abdominal pain or tenderness. °· You have a fever. °· You feel weak, nauseous, or you faint. °· You have  severe rectal pain or you are unable to have a bowel movement. °MAKE SURE YOU: °· Understand these instructions. °· Will watch your condition. °· Will get help right away if you are not doing well or get worse. °Document Released: 06/12/2001 Document Revised: 03/15/2011 Document Reviewed: 06/07/2010 °[employer]® Patient Information ©2015 [employer]. This information is not intended to replace advice given to you by your health care provider. Make sure you discuss any questions you have with your health care provider. ° °

## 2013-08-02 ENCOUNTER — Encounter (HOSPITAL_COMMUNITY): Payer: Self-pay | Admitting: Emergency Medicine

## 2013-08-02 ENCOUNTER — Emergency Department (HOSPITAL_COMMUNITY): Payer: Medicaid Other

## 2013-08-02 ENCOUNTER — Emergency Department (HOSPITAL_COMMUNITY)
Admission: EM | Admit: 2013-08-02 | Discharge: 2013-08-02 | Disposition: A | Discharge status: Home / Self Care | Payer: Medicaid Other | Attending: Emergency Medicine | Admitting: Emergency Medicine

## 2013-08-02 DIAGNOSIS — Z79899 Other long term (current) drug therapy: Secondary | ICD-10-CM | POA: Insufficient documentation

## 2013-08-02 DIAGNOSIS — Z880 Allergy status to penicillin: Secondary | ICD-10-CM | POA: Diagnosis not present

## 2013-08-02 DIAGNOSIS — R1031 Right lower quadrant pain: Secondary | ICD-10-CM | POA: Insufficient documentation

## 2013-08-02 DIAGNOSIS — R109 Unspecified abdominal pain: Secondary | ICD-10-CM

## 2013-08-02 DIAGNOSIS — Q210 Ventricular septal defect: Secondary | ICD-10-CM | POA: Diagnosis not present

## 2013-08-02 DIAGNOSIS — Z7982 Long term (current) use of aspirin: Secondary | ICD-10-CM | POA: Diagnosis not present

## 2013-08-02 DIAGNOSIS — E119 Type 2 diabetes mellitus without complications: Secondary | ICD-10-CM | POA: Insufficient documentation

## 2013-08-02 DIAGNOSIS — H913 Deaf nonspeaking, not elsewhere classified: Secondary | ICD-10-CM | POA: Diagnosis not present

## 2013-08-02 DIAGNOSIS — N898 Other specified noninflammatory disorders of vagina: Secondary | ICD-10-CM | POA: Insufficient documentation

## 2013-08-02 DIAGNOSIS — I10 Essential (primary) hypertension: Secondary | ICD-10-CM | POA: Diagnosis not present

## 2013-08-02 DIAGNOSIS — N739 Female pelvic inflammatory disease, unspecified: Secondary | ICD-10-CM | POA: Insufficient documentation

## 2013-08-02 LAB — HEPATIC FUNCTION PANEL
ALT: 18 U/L (ref 0–35)
AST: 22 U/L (ref 0–37)
Albumin: 4.2 g/dL (ref 3.5–5.2)
Alkaline Phosphatase: 62 U/L (ref 39–117)
Bilirubin, Direct: <0.2 mg/dL (ref 0.0–0.3)
TOTAL PROTEIN: 7.5 g/dL (ref 6.0–8.3)
Total Bilirubin: 0.7 mg/dL (ref 0.3–1.2)

## 2013-08-02 LAB — URINALYSIS, ROUTINE W REFLEX MICROSCOPIC
Bilirubin Urine: NEGATIVE
GLUCOSE, UA: NEGATIVE mg/dL
Hgb urine dipstick: NEGATIVE
Ketones, ur: NEGATIVE mg/dL
Nitrite: NEGATIVE
PROTEIN: NEGATIVE mg/dL
Specific Gravity, Urine: 1.017 (ref 1.005–1.030)
UROBILINOGEN UA: 0.2 mg/dL (ref 0.0–1.0)
pH: 6.5 (ref 5.0–8.0)

## 2013-08-02 LAB — URINE MICROSCOPIC-ADD ON

## 2013-08-02 LAB — WET PREP, GENITAL
Clue Cells Wet Prep HPF POC: NONE SEEN
Trich, Wet Prep: NONE SEEN
Yeast Wet Prep HPF POC: NONE SEEN

## 2013-08-02 LAB — BASIC METABOLIC PANEL
ANION GAP: 16 — AB (ref 5–15)
BUN: 12 mg/dL (ref 6–23)
CHLORIDE: 99 meq/L (ref 96–112)
CO2: 26 mEq/L (ref 19–32)
CREATININE: 0.73 mg/dL (ref 0.50–1.10)
Calcium: 9.7 mg/dL (ref 8.4–10.5)
GFR calc Af Amer: >90 mL/min (ref >90)
GFR calc non Af Amer: >90 mL/min (ref >90)
Glucose, Bld: 99 mg/dL (ref 70–99)
Potassium: 3.8 mEq/L (ref 3.7–5.3)
SODIUM: 141 meq/L (ref 137–147)

## 2013-08-02 LAB — CBC WITH DIFFERENTIAL/PLATELET
BASOS ABS: 0 10*3/uL (ref 0.0–0.1)
BASOS PCT: 0 % (ref 0–1)
Eosinophils Absolute: 0.1 10*3/uL (ref 0.0–0.7)
Eosinophils Relative: 1 % (ref 0–5)
HEMATOCRIT: 38.1 % (ref 36.0–46.0)
Hemoglobin: 13.1 g/dL (ref 12.0–15.0)
Lymphocytes Relative: 39 % (ref 12–46)
Lymphs Abs: 3.2 10*3/uL (ref 0.7–4.0)
MCH: 29.8 pg (ref 26.0–34.0)
MCHC: 34.4 g/dL (ref 30.0–36.0)
MCV: 86.6 fL (ref 78.0–100.0)
MONO ABS: 0.5 10*3/uL (ref 0.1–1.0)
Monocytes Relative: 6 % (ref 3–12)
NEUTROS ABS: 4.3 10*3/uL (ref 1.7–7.7)
NEUTROS PCT: 54 % (ref 43–77)
PLATELETS: 227 10*3/uL (ref 150–400)
RBC: 4.4 MIL/uL (ref 3.87–5.11)
RDW: 13.1 % (ref 11.5–15.5)
WBC: 8.2 10*3/uL (ref 4.0–10.5)

## 2013-08-02 LAB — LIPASE, BLOOD: Lipase: 25 U/L (ref 11–59)

## 2013-08-02 MED ORDER — AZITHROMYCIN 250 MG PO TABS
1000.0000 mg | ORAL_TABLET | Freq: Once | ORAL | Status: AC
  Administered 2013-08-02: 1000 mg via ORAL
  Filled 2013-08-02: qty 4

## 2013-08-02 MED ORDER — IOHEXOL 300 MG/ML  SOLN: 25.0000 mL | INTRAMUSCULAR | Status: AC

## 2013-08-02 MED ORDER — HYDROMORPHONE HCL PF 1 MG/ML IJ SOLN
0.5000 mg | Freq: Once | INTRAMUSCULAR | Status: AC
  Administered 2013-08-02: 0.5 mg via INTRAVENOUS
  Filled 2013-08-02: qty 1

## 2013-08-02 MED ORDER — DEXTROSE 5 % IV SOLN
1.0000 g | Freq: Once | INTRAVENOUS | Status: AC
  Administered 2013-08-02: 1 g via INTRAVENOUS
  Filled 2013-08-02: qty 10

## 2013-08-02 MED ORDER — MORPHINE SULFATE 4 MG/ML IJ SOLN
4.0000 mg | Freq: Once | INTRAMUSCULAR | Status: AC
  Administered 2013-08-02: 4 mg via INTRAVENOUS
  Filled 2013-08-02: qty 1

## 2013-08-02 MED ORDER — ONDANSETRON HCL 4 MG/2ML IJ SOLN
4.0000 mg | Freq: Once | INTRAMUSCULAR | Status: AC
  Administered 2013-08-02: 4 mg via INTRAVENOUS
  Filled 2013-08-02: qty 2

## 2013-08-02 MED ORDER — HYDROCODONE-ACETAMINOPHEN 5-325 MG PO TABS: 1.0000 | ORAL_TABLET | ORAL | Status: DC | PRN

## 2013-08-02 MED ORDER — IOHEXOL 300 MG/ML  SOLN: 100.0000 mL | Freq: Once | INTRAMUSCULAR | Status: DC | PRN

## 2013-08-02 NOTE — ED Notes (Signed)
Paged sign language interpreter

## 2013-08-02 NOTE — ED Notes (Signed)
Per pt sts she has been having vaginal bleeding and abdominal pain x 1 week.

## 2013-08-02 NOTE — ED Provider Notes (Signed)
Medical screening examination/treatment/procedure(s) were performed by non-physician practitioner and as supervising physician I was immediately available for consultation/collaboration.   EKG Interpretation None      Janice Massey is a 58 y.o. female here with abdominal pain. RLQ pain for a week. PA signed out to me pending CT. CT showed no acute process. PA performed vaginal exam that showed mod WBCs. Patient had hx of PID and was empirically treated. WBC nl, doesn't appear septic. Doesn't need admission for PID. I updated patient and will d/c home with pain meds.    Wandra Arthurs, MD 08/02/13 [phone]

## 2013-08-02 NOTE — ED Notes (Signed)
Pt finished contrast and ct called.

## 2013-08-02 NOTE — Discharge Instructions (Signed)
Take vicodin as prescribed for pain.   Follow up with your doctor.   Return to ER if you have severe pain, fever, vomiting.

## 2013-08-02 NOTE — ED Notes (Signed)
PA at bedside to do pelvic exam

## 2013-08-02 NOTE — ED Provider Notes (Signed)
CSN: [id number]     Arrival date & time 08/02/13  1019 History   First MD Initiated Contact with Patient 08/02/13 1143     Chief Complaint  Patient presents with  . Abdominal Pain  . Vaginal Bleeding     (Consider location/radiation/quality/duration/timing/severity/associated sxs/prior Treatment) HPI  Janice Massey is a 58 y.o. female S. medical history significant for non-insulin-dependent diabetes, hypertension, back pain, chronic PID complaining of severe right lower quadrant pain worsening over the course of the week. Patient rates her pain at 10 out of 10, she denies fever, chills, nausea, vomiting, change in bowel  Habits. Patient reports dysuria and had single episode of hematuria this a.m. Denies history of kidney stones.  She denies abnormal vaginal discharge, and protected sex for concern about STDs.  History of present illness performed with the aid of sign language interpreter  Past Medical History  Diagnosis Date  . Diabetes mellitus   . Hypertension   . Back pain   . Chronic PID   . Ventricular septal defect 2004 per echo  . Mute   . Deaf    Past Surgical History  Procedure Laterality Date  . Cholecystectomy  11/04/1993  . Tubal ligation  10/05/1978  . Colposcopy     History reviewed. No pertinent family history. History  Substance Use Topics  . Smoking status: Never Smoker   . Smokeless tobacco: Not on file  . Alcohol Use: No   OB History   Grav Para Term Preterm Abortions TAB SAB Ect Mult Living                 Review of Systems  10 systems reviewed and found to be negative, except as noted in the HPI.  Allergies  Codeine; Lisinopril; and Penicillins  Home Medications   Prior to Admission medications   Medication Sig Start Date End Date Taking? Authorizing Provider  antipyrine-benzocaine Toniann Fail) otic solution Place 3 drops into both ears every 2 (two) hours as needed for pain. 08/18/12   Arville Lime Schinlever, PA-C  aspirin 81 MG tablet Take 1  tablet (81 mg total) by mouth daily. 05/01/13   Blain Pais, MD  BD ULTRA-FINE LANCETS lancets To test blood glucose daily     Historical Provider, MD  Blood Glucose Monitoring Suppl (FREESTYLE LITE) DEVI Check blood glucose once daily before breakfast or as directed     Historical Provider, MD  glucose blood (FREESTYLE LITE) test strip To test blood sugar one time daily     Historical Provider, MD  losartan (COZAAR) 50 MG tablet TAKE 1 TABLET BY MOUTH EVERY DAY    Duwaine Maxin, DO  metFORMIN (GLUCOPHAGE) 1000 MG tablet TAKE 1 TABLET BY MOUTH TWICE DAILY    Duwaine Maxin, DO  metoprolol succinate (TOPROL-XL) 25 MG 24 hr tablet Take 1 tablet (25 mg total) by mouth daily. 06/13/12   Tonia Brooms, MD  pravastatin (PRAVACHOL) 40 MG tablet Take 1 tablet (40 mg total) by mouth daily. 10/24/12   Karren Cobble, MD   BP 131/87  Pulse 64  Temp(Src) 98.3 F (36.8 C)  Resp 14  SpO2 98% Physical Exam  Nursing note and vitals reviewed. Constitutional: She is oriented to person, place, and time. She appears well-developed and well-nourished. No distress.  HENT:  Head: Normocephalic and atraumatic.  Mouth/Throat: Oropharynx is clear and moist.  Eyes: Conjunctivae and EOM are normal. Pupils are equal, round, and reactive to light.  Cardiovascular: Normal rate, regular rhythm and intact distal  pulses.   Pulmonary/Chest: Effort normal and breath sounds normal. No stridor.  Abdominal: Soft. Bowel sounds are normal. She exhibits no distension and no mass. There is tenderness. There is no rebound and no guarding.  Right lower cautery pain with no guarding or rebound. Processing, Psoas an obturator are negative.  Genitourinary:  Pelvic exam is chaperoned by technician:  Sign language interpreter is present in the room explaining all procedures:  There are no rashes or lesions, no cervical or left adnexal tenderness however there is right adnexal tenderness palpation.  Musculoskeletal: Normal range  of motion.  Neurological: She is alert and oriented to person, place, and time.  Psychiatric: She has a normal mood and affect.    ED Course  Procedures (including critical care time) Labs Review Labs Reviewed  WET PREP, GENITAL - Abnormal; Notable for the following:    WBC, Wet Prep HPF POC TOO NUMEROUS TO COUNT (*)    All other components within normal limits  BASIC METABOLIC PANEL - Abnormal; Notable for the following:    Anion gap 16 (*)    All other components within normal limits  URINALYSIS, ROUTINE W REFLEX MICROSCOPIC - Abnormal; Notable for the following:    APPearance HAZY (*)    Leukocytes, UA SMALL (*)    All other components within normal limits  URINE MICROSCOPIC-ADD ON - Abnormal; Notable for the following:    Squamous Epithelial / LPF FEW (*)    All other components within normal limits  GC/CHLAMYDIA PROBE AMP  CBC WITH DIFFERENTIAL  HEPATIC FUNCTION PANEL  LIPASE, BLOOD    Imaging Review No results found.   EKG Interpretation None      MDM   Final diagnoses:  None   Filed Vitals:   08/02/13 1400 08/02/13 1415 08/02/13 1430 08/02/13 1445  BP: 111/64  131/87   Pulse: 71 71 73 64  Temp:      Resp: 15 19 12 14   SpO2: 99% 97% 96% 98%    Medications  iohexol (OMNIPAQUE) 300 MG/ML solution 25 mL (not administered)  cefTRIAXone (ROCEPHIN) 1 g in dextrose 5 % 50 mL IVPB (1 g Intravenous New Bag/Given 08/02/13 1534)  morphine 4 MG/ML injection 4 mg (4 mg Intravenous Given 08/02/13 1352)  ondansetron (ZOFRAN) injection 4 mg (4 mg Intravenous Given 08/02/13 1351)  HYDROmorphone (DILAUDID) injection 0.5 mg (0.5 mg Intravenous Given 08/02/13 1531)  azithromycin (ZITHROMAX) tablet 1,000 mg (1,000 mg Oral Given 08/02/13 1532)    Janice Massey is a 58 y.o. female presenting with severe right lower cautery pain worsening over the course of the week. Patient has no systemic signs of infection. She has had dysuria vaginal bleeding and abnormal vaginal discharge.  Patient has a history of chronic PID, she is followed at [facility]. She has a right-sided adnexal tenderness. There are too numerous to count white blood cells on wet prep. I think this is likely a recurrence of her PID however considering the location of her pain and the fact that she has not had an appendectomy CT is ordered to evaluate for tubo-ovarian abscess and appendicitis. Patient is well appearing, not septic, afebrile and she is appropriate for outpatient PID treatment.  Case is signed out to Dr. Darl Householder at shift change: Plan is to followup CT abdomen pelvis and treat for PID.   Monico Blitz, PA-C 08/02/13 1546

## 2013-08-03 ENCOUNTER — Encounter: Payer: Self-pay | Admitting: *Deleted

## 2013-08-03 LAB — URINE CULTURE
Colony Count: NO GROWTH
Culture: NO GROWTH

## 2013-08-03 LAB — GC/CHLAMYDIA PROBE AMP
CT PROBE, AMP APTIMA: NEGATIVE
GC Probe RNA: NEGATIVE

## 2013-08-08 ENCOUNTER — Inpatient Hospital Stay (HOSPITAL_COMMUNITY): Payer: Medicaid Other

## 2013-08-08 ENCOUNTER — Encounter (HOSPITAL_COMMUNITY): Payer: Self-pay | Admitting: *Deleted

## 2013-08-08 ENCOUNTER — Inpatient Hospital Stay (HOSPITAL_COMMUNITY)
Admission: AD | Admit: 2013-08-08 | Discharge: 2013-08-08 | Disposition: A | Discharge status: Home / Self Care | Payer: Medicaid Other | Source: Ambulatory Visit | Attending: Obstetrics & Gynecology | Admitting: Obstetrics & Gynecology

## 2013-08-08 DIAGNOSIS — R109 Unspecified abdominal pain: Secondary | ICD-10-CM | POA: Diagnosis present

## 2013-08-08 DIAGNOSIS — N950 Postmenopausal bleeding: Secondary | ICD-10-CM | POA: Insufficient documentation

## 2013-08-08 DIAGNOSIS — N949 Unspecified condition associated with female genital organs and menstrual cycle: Secondary | ICD-10-CM | POA: Diagnosis not present

## 2013-08-08 DIAGNOSIS — I10 Essential (primary) hypertension: Secondary | ICD-10-CM | POA: Diagnosis not present

## 2013-08-08 DIAGNOSIS — J45909 Unspecified asthma, uncomplicated: Secondary | ICD-10-CM | POA: Insufficient documentation

## 2013-08-08 DIAGNOSIS — E119 Type 2 diabetes mellitus without complications: Secondary | ICD-10-CM | POA: Insufficient documentation

## 2013-08-08 DIAGNOSIS — H9190 Unspecified hearing loss, unspecified ear: Secondary | ICD-10-CM | POA: Diagnosis not present

## 2013-08-08 LAB — URINALYSIS, ROUTINE W REFLEX MICROSCOPIC
Bilirubin Urine: NEGATIVE
GLUCOSE, UA: NEGATIVE mg/dL
Hgb urine dipstick: NEGATIVE
Ketones, ur: NEGATIVE mg/dL
NITRITE: NEGATIVE
PH: 6 (ref 5.0–8.0)
Protein, ur: NEGATIVE mg/dL
SPECIFIC GRAVITY, URINE: 1.02 (ref 1.005–1.030)
Urobilinogen, UA: 0.2 mg/dL (ref 0.0–1.0)

## 2013-08-08 LAB — CBC
HCT: 38.5 % (ref 36.0–46.0)
Hemoglobin: 13.5 g/dL (ref 12.0–15.0)
MCH: 30.2 pg (ref 26.0–34.0)
MCHC: 35.1 g/dL (ref 30.0–36.0)
MCV: 86.1 fL (ref 78.0–100.0)
PLATELETS: 224 10*3/uL (ref 150–400)
RBC: 4.47 MIL/uL (ref 3.87–5.11)
RDW: 13.1 % (ref 11.5–15.5)
WBC: 8.1 10*3/uL (ref 4.0–10.5)

## 2013-08-08 LAB — URINE MICROSCOPIC-ADD ON

## 2013-08-08 LAB — POCT PREGNANCY, URINE: Preg Test, Ur: NEGATIVE

## 2013-08-08 MED ORDER — FLEET ENEMA 7-19 GM/118ML RE ENEM
1.0000 | ENEMA | Freq: Once | RECTAL | Status: AC
  Administered 2013-08-08: 1 via RECTAL

## 2013-08-08 MED ORDER — PROMETHAZINE HCL 25 MG/ML IJ SOLN
25.0000 mg | Freq: Once | INTRAMUSCULAR | Status: AC
  Administered 2013-08-08: 25 mg via INTRAMUSCULAR
  Filled 2013-08-08: qty 1

## 2013-08-08 MED ORDER — PROMETHAZINE HCL 25 MG PO TABS: 25.0000 mg | ORAL_TABLET | Freq: Four times a day (QID) | ORAL | Status: DC | PRN

## 2013-08-08 MED ORDER — ALBUTEROL SULFATE HFA 108 (90 BASE) MCG/ACT IN AERS: 1.0000 | INHALATION_SPRAY | Freq: Four times a day (QID) | RESPIRATORY_TRACT | Status: DC | PRN

## 2013-08-08 MED ORDER — KETOROLAC TROMETHAMINE 60 MG/2ML IM SOLN: 60.0000 mg | Freq: Once | INTRAMUSCULAR | Status: DC

## 2013-08-08 NOTE — MAU Provider Note (Signed)
History     CSN: [id number]  Arrival date and time: 08/08/13 7741   First Provider Initiated Contact with Patient 08/08/13 1202      Chief Complaint  Patient presents with  . Abdominal Pain   HPI Comments: Janice Massey 58 y.o. [id number] presents to MAU with pelvic pains. She is hearing impaired and verbally impaired with an interpreter. She was seen at [facility] on 7/30 with same complaints. She had a negative CT of her abdomen and pelvis and her white count was 8.2. She had a negative GC/ Chlamydia. She is sexually active but has history of chronic PID and was given Rocephin. She was also given percocet and she complains today of constipation x 2 days and nausea and hot flashes from medications. She had her menopause at 58 years old no HRT. She is also complaning of vaginal bleeding x 1 episode. Her last pap smear was 2010.  She also has asthma and has run out of her inhaler and would like one.    Abdominal Pain Associated symptoms include constipation and nausea.      Past Medical History  Diagnosis Date  . Diabetes mellitus   . Hypertension   . Back pain   . Chronic PID   . Ventricular septal defect 2004 per echo  . Mute   . Deaf     Past Surgical History  Procedure Laterality Date  . Cholecystectomy  11/04/1993  . Tubal ligation  10/05/1978  . Colposcopy      Family History  Problem Relation Age of Onset  . Cancer Mother   . Hypertension Father   . Diabetes Father   . Diabetes Paternal Aunt   . Diabetes Maternal Grandfather     History  Substance Use Topics  . Smoking status: Never Smoker   . Smokeless tobacco: Not on file  . Alcohol Use: No    Allergies:  Allergies  Allergen Reactions  . Codeine Itching  . Lisinopril Cough  . Penicillins Itching    Prescriptions prior to admission  Medication Sig Dispense Refill  . aspirin 81 MG tablet Take 81 mg by mouth every morning.      . hydrALAZINE (APRESOLINE) 25 MG tablet Take 25 mg by mouth daily.      Marland Kitchen  HYDROcodone-acetaminophen (NORCO/VICODIN) 5-325 MG per tablet Take 1 tablet by mouth every 4 (four) hours as needed for moderate pain or severe pain.  10 tablet  0  . losartan (COZAAR) 50 MG tablet Take 50 mg by mouth every morning.      . metFORMIN (GLUCOPHAGE) 1000 MG tablet Take 1,000 mg by mouth 2 (two) times daily with a meal.      . pravastatin (PRAVACHOL) 40 MG tablet Take 40 mg by mouth at bedtime.        Review of Systems  Constitutional: Negative.   HENT: Negative.   Eyes: Negative.   Respiratory: Positive for wheezing.   Cardiovascular: Negative.   Gastrointestinal: Positive for nausea, abdominal pain and constipation.  Genitourinary: Negative.        Vaginal bleeding  Musculoskeletal: Negative.   Skin: Negative.   Neurological: Negative.   Psychiatric/Behavioral: Negative.    Physical Exam   Blood pressure 127/80, pulse 104, temperature 98.7 F (37.1 C), temperature source Oral, resp. rate 18, height 5\' 8"  (1.727 m), weight 83.915 kg (185 lb).  Physical Exam  Constitutional: She is oriented to person, place, and time. She appears well-developed and well-nourished. No distress.  HENT:  Head: Normocephalic and atraumatic.  Eyes: Pupils are equal, round, and reactive to light.  Cardiovascular: Normal rate, regular rhythm and normal heart sounds.   Respiratory: Effort normal and breath sounds normal. No respiratory distress. She has no wheezes. She has no rales.  GI: Soft. Bowel sounds are normal. She exhibits no distension. There is tenderness. There is no rebound and no guarding.  Genitourinary:  Genital:External negative Vaginal:small amount white discharge Cervix:looks like small polyp at os with tiny blood there Bimanual:negative fr enlargement , tenderness or mass   Musculoskeletal: Normal range of motion.  Neurological: She is alert and oriented to person, place, and time.  Skin: Skin is warm and dry.  Psychiatric: She has a normal mood and affect. Her  behavior is normal. Judgment and thought content normal.   Results for orders placed during the hospital encounter of 08/08/13 (from the past 24 hour(s))  URINALYSIS, ROUTINE W REFLEX MICROSCOPIC     Status: Abnormal   Collection Time    08/08/13 10:10 AM      Result Value Ref Range   Color, Urine YELLOW  YELLOW   APPearance CLEAR  CLEAR   Specific Gravity, Urine 1.020  1.005 - 1.030   pH 6.0  5.0 - 8.0   Glucose, UA NEGATIVE  NEGATIVE mg/dL   Hgb urine dipstick NEGATIVE  NEGATIVE   Bilirubin Urine NEGATIVE  NEGATIVE   Ketones, ur NEGATIVE  NEGATIVE mg/dL   Protein, ur NEGATIVE  NEGATIVE mg/dL   Urobilinogen, UA 0.2  0.0 - 1.0 mg/dL   Nitrite NEGATIVE  NEGATIVE   Leukocytes, UA TRACE (*) NEGATIVE  URINE MICROSCOPIC-ADD ON     Status: None   Collection Time    08/08/13 10:10 AM      Result Value Ref Range   Squamous Epithelial / LPF RARE  RARE   WBC, UA 3-6  <3 WBC/hpf   RBC / HPF 0-2  <3 RBC/hpf  POCT PREGNANCY, URINE     Status: None   Collection Time    08/08/13 10:53 AM      Result Value Ref Range   Preg Test, Ur NEGATIVE  NEGATIVE  CBC     Status: None   Collection Time    08/08/13 12:14 PM      Result Value Ref Range   WBC 8.1  4.0 - 10.5 K/uL   RBC 4.47  3.87 - 5.11 MIL/uL   Hemoglobin 13.5  12.0 - 15.0 g/dL   HCT 38.5  36.0 - 46.0 %   MCV 86.1  78.0 - 100.0 fL   MCH 30.2  26.0 - 34.0 pg   MCHC 35.1  30.0 - 36.0 g/dL   RDW 13.1  11.5 - 15.5 %   Platelets 224  150 - 400 K/uL   US Transvaginal Non-ob  08/08/2013   CLINICAL DATA:  Postmenopausal vaginal bleeding since last week, right lower quadrant abdominal pain for 1 month.  EXAM: TRANSABDOMINAL AND TRANSVAGINAL ULTRASOUND OF PELVIS  TECHNIQUE: Both transabdominal and transvaginal ultrasound examinations of the pelvis were performed. Transabdominal technique was performed for global imaging of the pelvis including uterus, ovaries, adnexal regions, and pelvic cul-de-sac. It was necessary to proceed with endovaginal  exam following the transabdominal exam to visualize the endometrium and ovaries.  COMPARISON:  None  FINDINGS: Uterus  Measurements: 5.9 x 4.2 x 2.8 cm. Anteverted, anteflexed. Postmenopausal type configuration. No fibroids or other mass visualized.  Endometrium  Thickness: 3 mm.  No focal abnormality visualized.  Right  ovary  Measurements: 2.1 x 1.4 x 1.0 cm. Normal appearance/no adnexal mass.  Left ovary  Measurements: 1.4 x 1.2 x 1.1 cm. Normal appearance/no adnexal mass.  Other findings  No free fluid.  IMPRESSION: No acute abnormality. Uniformly thin endometrium measuring 3 mm. In the setting of post-menopausal bleeding, this is consistent with a benign etiology such as endometrial atrophy. If bleeding remains unresponsive to hormonal or medical therapy, sonohysterogram should be considered for focal lesion work-up. (Ref: Radiological Reasoning: Algorithmic Workup of Abnormal Vaginal Bleeding with Endovaginal Sonography and Sonohysterography. AJR 2008; 712:W58-09)   Electronically Signed   By: Conchita Paris M.D.   On: 08/08/2013 13:47   US Pelvis Complete  08/08/2013   CLINICAL DATA:  Postmenopausal vaginal bleeding since last week, right lower quadrant abdominal pain for 1 month.  EXAM: TRANSABDOMINAL AND TRANSVAGINAL ULTRASOUND OF PELVIS  TECHNIQUE: Both transabdominal and transvaginal ultrasound examinations of the pelvis were performed. Transabdominal technique was performed for global imaging of the pelvis including uterus, ovaries, adnexal regions, and pelvic cul-de-sac. It was necessary to proceed with endovaginal exam following the transabdominal exam to visualize the endometrium and ovaries.  COMPARISON:  None  FINDINGS: Uterus  Measurements: 5.9 x 4.2 x 2.8 cm. Anteverted, anteflexed. Postmenopausal type configuration. No fibroids or other mass visualized.  Endometrium  Thickness: 3 mm.  No focal abnormality visualized.  Right ovary  Measurements: 2.1 x 1.4 x 1.0 cm. Normal appearance/no adnexal  mass.  Left ovary  Measurements: 1.4 x 1.2 x 1.1 cm. Normal appearance/no adnexal mass.  Other findings  No free fluid.  IMPRESSION: No acute abnormality. Uniformly thin endometrium measuring 3 mm. In the setting of post-menopausal bleeding, this is consistent with a benign etiology such as endometrial atrophy. If bleeding remains unresponsive to hormonal or medical therapy, sonohysterogram should be considered for focal lesion work-up. (Ref: Radiological Reasoning: Algorithmic Workup of Abnormal Vaginal Bleeding with Endovaginal Sonography and Sonohysterography. AJR 2008; 983:J82-50)   Electronically Signed   By: Conchita Paris M.D.   On: 08/08/2013 13:47     MAU Course  Procedures  MDM Phenergan 25 mg IM Fleets enema CBC Toradol 60 mg IM/ pain improved She asked for note telling her boyfriend that she could not have sex as he is " always after her" Assessment and Plan   A: Vaginal bleeding in postmenopausal female Asthma  P: Schedule for pelvic ultrasound  Phenergan 25 mg po q6 hours to add to percocet Schedule for [facility] 08/13/13 at 3 PM Proventil Inhaler replacement   Georgia Duff 08/08/2013, 12:04 PM

## 2013-08-08 NOTE — MAU Provider Note (Signed)
Attestation of Attending Supervision of Advanced Practitioner (CNM/NP): Evaluation and management procedures were performed by the Advanced Practitioner under my supervision and collaboration.  I have reviewed the Advanced Practitioner's note and chart, and I agree with the management and plan.  HARRAWAY-SMITH, Shadeed Colberg 3:41 PM

## 2013-08-08 NOTE — Discharge Instructions (Signed)
Abnormal Uterine Bleeding Abnormal uterine bleeding can affect women at various stages in life, including teenagers, women in their reproductive years, pregnant women, and women who have reached menopause. Several kinds of uterine bleeding are considered abnormal, including:  Bleeding or spotting between periods.   Bleeding after sexual intercourse.   Bleeding that is heavier or more than normal.   Periods that last longer than usual.  Bleeding after menopause.  Many cases of abnormal uterine bleeding are minor and simple to treat, while others are more serious. Any type of abnormal bleeding should be evaluated by your health care provider. Treatment will depend on the cause of the bleeding. HOME CARE INSTRUCTIONS Monitor your condition for any changes. The following actions may help to alleviate any discomfort you are experiencing:  Avoid the use of tampons and douches as directed by your health care provider.  Change your pads frequently. You should get regular pelvic exams and Pap tests. Keep all follow-up appointments for diagnostic tests as directed by your health care provider.  SEEK MEDICAL CARE IF:   Your bleeding lasts more than 1 week.   You feel dizzy at times.  SEEK IMMEDIATE MEDICAL CARE IF:   You pass out.   You are changing pads every 15 to 30 minutes.   You have abdominal pain.  You have a fever.   You become sweaty or weak.   You are passing large blood clots from the vagina.   You start to feel nauseous and vomit. MAKE SURE YOU:   Understand these instructions.  Will watch your condition.  Will get help right away if you are not doing well or get worse. Document Released: 12/21/2004 Document Revised: 12/26/2012 Document Reviewed: 07/20/2012 Exit[employer] Patient Information 2015 [employer]. This information is not intended to replace advice given to you by your health care provider. Make sure you discuss any questions you have with your  health care provider.  

## 2013-08-08 NOTE — MAU Note (Signed)
Pt seen at [facility] on 7/30 for abd ain. Dx with possible PID given hydrocodone for pain. Pt still c/o sharp pain in mid to lower right side. Pain medication not helping.

## 2013-08-13 ENCOUNTER — Telehealth: Payer: Self-pay | Admitting: *Deleted

## 2013-08-13 ENCOUNTER — Encounter: Payer: Self-pay | Admitting: *Deleted

## 2013-08-13 ENCOUNTER — Encounter: Payer: Medicaid Other | Admitting: Obstetrics & Gynecology

## 2013-08-13 NOTE — Telephone Encounter (Signed)
Attempted to contact patient, no answer, left message for patient to call and reschedule appointment.  Will send letter.  Letter sent.

## 2013-09-04 ENCOUNTER — Other Ambulatory Visit: Payer: Self-pay | Admitting: Internal Medicine

## 2013-10-01 ENCOUNTER — Other Ambulatory Visit (HOSPITAL_COMMUNITY)
Admission: RE | Admit: 2013-10-01 | Discharge: 2013-10-01 | Disposition: A | Discharge status: Home / Self Care | Payer: Medicaid Other | Source: Ambulatory Visit | Attending: Obstetrics and Gynecology | Admitting: Obstetrics and Gynecology

## 2013-10-01 ENCOUNTER — Ambulatory Visit (INDEPENDENT_AMBULATORY_CARE_PROVIDER_SITE_OTHER): Payer: Medicaid Other | Admitting: Obstetrics and Gynecology

## 2013-10-01 ENCOUNTER — Encounter: Payer: Self-pay | Admitting: Obstetrics and Gynecology

## 2013-10-01 VITALS — BP 145/81 | HR 83 | Wt 187.0 lb

## 2013-10-01 DIAGNOSIS — N882 Stricture and stenosis of cervix uteri: Secondary | ICD-10-CM

## 2013-10-01 DIAGNOSIS — Z1151 Encounter for screening for human papillomavirus (HPV): Secondary | ICD-10-CM

## 2013-10-01 DIAGNOSIS — N950 Postmenopausal bleeding: Secondary | ICD-10-CM | POA: Insufficient documentation

## 2013-10-01 DIAGNOSIS — Z124 Encounter for screening for malignant neoplasm of cervix: Secondary | ICD-10-CM | POA: Diagnosis not present

## 2013-10-01 DIAGNOSIS — Z780 Asymptomatic menopausal state: Secondary | ICD-10-CM

## 2013-10-01 DIAGNOSIS — Z802 Family history of malignant neoplasm of other respiratory and intrathoracic organs: Secondary | ICD-10-CM

## 2013-10-01 LAB — POCT URINALYSIS DIP (DEVICE)
Bilirubin Urine: NEGATIVE
Glucose, UA: NEGATIVE mg/dL
Ketones, ur: NEGATIVE mg/dL
NITRITE: NEGATIVE
PROTEIN: NEGATIVE mg/dL
SPECIFIC GRAVITY, URINE: 1.015 (ref 1.005–1.030)
Urobilinogen, UA: 0.2 mg/dL (ref 0.0–1.0)
pH: 6.5 (ref 5.0–8.0)

## 2013-10-01 MED ORDER — MISOPROSTOL 200 MCG PO TABS: ORAL_TABLET | ORAL | Status: DC

## 2013-10-01 NOTE — Progress Notes (Signed)
Patient ID: Janice Massey, female   DOB: 1955-02-25, 58 y.o.   MRN: [id number] 58 yo G2P2 presenting today for evaluation of postmenopausal vaginal bleeding. Patient reports noticing over the past 2 months 2 episodes of vaginal spotting. She states it happens once a month, enough to notice on her underwear and when she wipes. Patient is still sexually active without any associations with vaginal spotting  Past Medical History  Diagnosis Date  . Diabetes mellitus   . Hypertension   . Back pain   . Chronic PID   . Ventricular septal defect 2004 per echo  . Mute   . Deaf    Past Surgical History  Procedure Laterality Date  . Cholecystectomy  11/04/1993  . Tubal ligation  10/05/1978  . Colposcopy     Family History  Problem Relation Age of Onset  . Cancer Mother   . Hypertension Father   . Diabetes Father   . Diabetes Paternal Aunt   . Diabetes Maternal Grandfather    History  Substance Use Topics  . Smoking status: Never Smoker   . Smokeless tobacco: Not on file  . Alcohol Use: No   GENERAL: Well-developed, well-nourished female in no acute distress.  ABDOMEN: Soft, nontender, nondistended. No organomegaly. PELVIC: Normal external female genitalia. Vagina is pink and rugated.  Normal discharge. Normal appearing cervix. Uterus is normal in size. No adnexal mass or tenderness. EXTREMITIES: No cyanosis, clubbing, or edema, 2+ distal pulses.   US Pelvis Complete  08/08/2013 CLINICAL DATA: Postmenopausal vaginal bleeding since last week, right lower quadrant abdominal pain for 1 month. EXAM: TRANSABDOMINAL AND TRANSVAGINAL ULTRASOUND OF PELVIS TECHNIQUE: Both transabdominal and transvaginal ultrasound examinations of the pelvis were performed. Transabdominal technique was performed for global imaging of the pelvis including uterus, ovaries, adnexal regions, and pelvic cul-de-sac. It was necessary to proceed with endovaginal exam following the transabdominal exam to visualize the endometrium  and ovaries. COMPARISON: None FINDINGS: Uterus Measurements: 5.9 x 4.2 x 2.8 cm. Anteverted, anteflexed. Postmenopausal type configuration. No fibroids or other mass visualized. Endometrium Thickness: 3 mm. No focal abnormality visualized. Right ovary Measurements: 2.1 x 1.4 x 1.0 cm. Normal appearance/no adnexal mass. Left ovary Measurements: 1.4 x 1.2 x 1.1 cm. Normal appearance/no adnexal mass. Other findings No free fluid. IMPRESSION: No acute abnormality. Uniformly thin endometrium measuring 3 mm. In the setting of post-menopausal bleeding, this is consistent with a benign etiology such as endometrial atrophy. If bleeding remains unresponsive to hormonal or medical therapy, sonohysterogram should be considered for focal lesion work-up. (Ref: Radiological Reasoning: Algorithmic Workup of Abnormal Vaginal Bleeding with Endovaginal Sonography and Sonohysterography. AJR 2008; 701:X79-39) Electronically Signed By: Conchita Paris M.D. On: 08/08/2013 13:47   A/P 58 yo G2P2 here for evaluation of postmenopausal vaginal bleeding Pap smear collected today - Discussed need for endometrial biopsy ENDOMETRIAL BIOPSY     The indications for endometrial biopsy were reviewed.   Risks of the biopsy including cramping, bleeding, infection, uterine perforation, inadequate specimen and need for additional procedures  were discussed. The patient states she understands and agrees to undergo procedure today. Consent was signed. Time out was performed. Urine HCG was negative. A sterile speculum was placed in the patient's vagina and the cervix was prepped with Betadine. A single-toothed tenaculum was placed on the anterior lip of the cervix to stabilize it. Attempt was made to perform biopsy but secondary to stenotic os, the procedure was aborted. The patient will return for endometrial biopsy following cytotec administration

## 2013-10-01 NOTE — Progress Notes (Signed)
Patient reports that she has spotting for the past two months. Never as heavy as a period. She had her last real period about 10 years ago. She had a workup in the past for pmb, including endometrial biopsy.

## 2013-10-02 LAB — CYTOLOGY - PAP

## 2013-10-10 ENCOUNTER — Ambulatory Visit (INDEPENDENT_AMBULATORY_CARE_PROVIDER_SITE_OTHER): Payer: Medicaid Other | Admitting: Internal Medicine

## 2013-10-10 ENCOUNTER — Encounter: Payer: Self-pay | Admitting: Internal Medicine

## 2013-10-10 VITALS — BP 138/78 | HR 77 | Temp 98.1°F | Ht 67.0 in | Wt 184.4 lb

## 2013-10-10 DIAGNOSIS — E119 Type 2 diabetes mellitus without complications: Secondary | ICD-10-CM

## 2013-10-10 DIAGNOSIS — I10 Essential (primary) hypertension: Secondary | ICD-10-CM

## 2013-10-10 DIAGNOSIS — J45909 Unspecified asthma, uncomplicated: Secondary | ICD-10-CM

## 2013-10-10 DIAGNOSIS — Z23 Encounter for immunization: Secondary | ICD-10-CM

## 2013-10-10 DIAGNOSIS — E785 Hyperlipidemia, unspecified: Secondary | ICD-10-CM

## 2013-10-10 DIAGNOSIS — Z0000 Encounter for general adult medical examination without abnormal findings: Secondary | ICD-10-CM

## 2013-10-10 DIAGNOSIS — N950 Postmenopausal bleeding: Secondary | ICD-10-CM

## 2013-10-10 DIAGNOSIS — R6884 Jaw pain: Secondary | ICD-10-CM

## 2013-10-10 LAB — POCT GLYCOSYLATED HEMOGLOBIN (HGB A1C): Hemoglobin A1C: 6.3

## 2013-10-10 LAB — GLUCOSE, CAPILLARY: Glucose-Capillary: 84 mg/dL (ref 70–99)

## 2013-10-10 NOTE — Patient Instructions (Addendum)
General Instructions:  1. Please follow-up with GYN on 11/05/2013 for endometrial biopsy.     2. Please take all medications as prescribed.    3. If you have worsening of your symptoms or new symptoms arise, please call the clinic ([phone]), or go to the ER immediately if symptoms are severe.  Please return in 6 months for follow-up.   Thank you for bringing your medicines today. This helps [place] keep you safe from mistakes.   Progress Toward Treatment Goals:  Treatment Goal 10/10/2013  Hemoglobin A1C at goal  Blood pressure at goal    Self Care Goals & Plans:  Self Care Goal 10/10/2013  Manage my medications take my medicines as prescribed; bring my medications to every visit; refill my medications on time  Monitor my health -  Eat healthy foods drink diet soda or water instead of juice or soda; eat more vegetables; eat foods that are low in salt; eat baked foods instead of fried foods; eat fruit for snacks and desserts  Be physically active -  Meeting treatment goals -    Home Blood Glucose Monitoring 10/10/2013  Check my blood sugar no home glucose monitoring  When to check my blood sugar -     Care Management & Community Referrals:  Referral 05/01/2013  Referrals made for care management support none needed         Diabetes Mellitus and Food It is important for you to manage your blood sugar (glucose) level. Your blood glucose level can be greatly affected by what you eat. Eating healthier foods in the appropriate amounts throughout the day at about the same time each day will help you control your blood glucose level. It can also help slow or prevent worsening of your diabetes mellitus. Healthy eating may even help you improve the level of your blood pressure and reach or maintain a healthy weight.  HOW CAN FOOD AFFECT ME? Carbohydrates Carbohydrates affect your blood glucose level more than any other type of food. Your dietitian will help you determine how many  carbohydrates to eat at each meal and teach you how to count carbohydrates. Counting carbohydrates is important to keep your blood glucose at a healthy level, especially if you are using insulin or taking certain medicines for diabetes mellitus. Alcohol Alcohol can cause sudden decreases in blood glucose (hypoglycemia), especially if you use insulin or take certain medicines for diabetes mellitus. Hypoglycemia can be a life-threatening condition. Symptoms of hypoglycemia (sleepiness, dizziness, and disorientation) are similar to symptoms of having too much alcohol.  If your health care provider has given you approval to drink alcohol, do so in moderation and use the following guidelines:  Women should not have more than one drink per day, and men should not have more than two drinks per day. One drink is equal to:  12 oz of beer.  5 oz of wine.  1 oz of hard liquor.  Do not drink on an empty stomach.  Keep yourself hydrated. Have water, diet soda, or unsweetened iced tea.  Regular soda, juice, and other mixers might contain a lot of carbohydrates and should be counted. WHAT FOODS ARE NOT RECOMMENDED? As you make food choices, it is important to remember that all foods are not the same. Some foods have fewer nutrients per serving than other foods, even though they might have the same number of calories or carbohydrates. It is difficult to get your body what it needs when you eat foods with fewer nutrients. Examples of  foods that you should avoid that are high in calories and carbohydrates but low in nutrients include:  Trans fats (most processed foods list trans fats on the Nutrition Facts label).  Regular soda.  Juice.  Candy.  Sweets, such as cake, pie, doughnuts, and cookies.  Fried foods. WHAT FOODS CAN I EAT? Have nutrient-rich foods, which will nourish your body and keep you healthy. The food you should eat also will depend on several factors, including:  The calories you  need.  The medicines you take.  Your weight.  Your blood glucose level.  Your blood pressure level.  Your cholesterol level. You also should eat a variety of foods, including:  Protein, such as meat, poultry, fish, tofu, nuts, and seeds (lean animal proteins are best).  Fruits.  Vegetables.  Dairy products, such as milk, cheese, and yogurt (low fat is best).  Breads, grains, pasta, cereal, rice, and beans.  Fats such as olive oil, trans fat-free margarine, canola oil, avocado, and olives. DOES EVERYONE WITH DIABETES MELLITUS HAVE THE SAME MEAL PLAN? Because every person with diabetes mellitus is different, there is not one meal plan that works for everyone. It is very important that you meet with a dietitian who will help you create a meal plan that is just right for you. Document Released: 09/17/2004 Document Revised: 12/26/2012 Document Reviewed: 11/17/2012 [facility] Patient Information 2015 [place], [place]. This information is not intended to replace advice given to you by your health care provider. Make sure you discuss any questions you have with your health care provider.   Fish Oil, Omega-3 Fatty Acids capsules (OTC) What is this medicine? FISH OIL, OMEGA-3 FATTY ACIDS (Fish Oil, oh MAY ga - 3 fatty AS ids) are essential fats. It is promoted to help support a healthy heart. This dietary supplement is used to add to a healthy diet. The FDA has not approved this supplement for any medical use. This supplement may be used for other purposes; ask your health care provider or pharmacist if you have questions. This medicine may be used for other purposes; ask your health care provider or pharmacist if you have questions. COMMON BRAND NAME(S): [employer], [employer] Omega-3 1450, [employer] Blue Omega, [place] [employer] Omega-3 2100, Omega-3, OMEGA-3 IQ DHA, Omega-3 [place], Ovega-3, [place], [place], [place] SPORT What should I tell my health care provider  before I take this medicine? They need to know if you have any of these conditions -bleeding problems -lung or breathing disease, like asthma -an unusual or allergic reaction to fish oil, omega-3 fatty acids, fish, other medicines, foods, dyes, or preservatives -pregnant or trying to get pregnant -breast-feeding How should I use this medicine? Take this medicine by mouth with a glass of water. Follow the directions on the package or prescription label. Take with food. Take your medicine at regular intervals. Do not take your medicine more often than directed. Talk to your pediatrician regarding the use of this medicine in children. Special care may be needed. This medicine should not be used in children without a doctor's advice. Overdosage: If you think you have taken too much of this medicine contact a poison control center or emergency room at once. NOTE: This medicine is only for you. Do not share this medicine with others. What if I miss a dose? If you miss a dose, take it as soon as you can. If it is almost time for your next dose, take only that dose. Do not take double or extra  doses. What may interact with this medicine? -aspirin and aspirin-like medicines -herbal products like danshen, dong quai, garlic pills, ginger, ginkgo biloba, horse chestnut, willow bark, and others -medicines that treat or prevent blood clots like enoxaparin, heparin, warfarin This list may not describe all possible interactions. Give your health care provider a list of all the medicines, herbs, non-prescription drugs, or dietary supplements you use. Also tell them if you smoke, drink alcohol, or use illegal drugs. Some items may interact with your medicine. What should I watch for while using this medicine? Follow a good diet and exercise plan. Taking a dietary supplement does not replace a healthy lifestyle. Some foods that have omega-3 fatty acids naturally are fatty fish like albacore tuna, halibut, herring,  mackerel, lake trout, salmon, and sardines. Too much of this supplement can be unsafe. Talk to your doctor or health care provider about how much of this supplement is right for you. If you are scheduled for any medical or dental procedure, tell your healthcare provider that you are taking this medicine. You may need to stop taking this medicine before the procedure. Herbal or dietary supplements are not regulated like medicines. Rigid quality control standards are not required for dietary supplements. The purity and strength of these products can vary. The safety and effect of this dietary supplement for a certain disease or illness is not well known. This product is not intended to diagnose, treat, cure or prevent any disease. The Food and Drug Administration suggests the following to help consumers protect themselves: -Always read product labels and follow directions. -Natural does not mean a product is safe for humans to take. -Look for products that include USP after the ingredient name. This means that the manufacturer followed the standards of the [place] Pharmacopoeia. -Supplements made or sold by a nationally known food or drug company are more likely to be made under tight controls. You can write to the company for more information about how the product was made. What side effects may I notice from receiving this medicine? Side effects that you should report to your doctor or health care professional as soon as possible: -allergic reactions like skin rash, itching or hives, swelling of the face, lips, or tongue -breathing problems -changes in your moods or emotions -unusual bleeding or bruising Side effects that usually do not require medical attention (report to your doctor or health care professional if they continue or are bothersome): -bad or fishy breath -belching -diarrhea -nausea -stomach gas, upset -weight gain This list may not describe all possible side effects. Call your doctor  for medical advice about side effects. You may report side effects to FDA at 1-800-FDA-1088. Where should I keep my medicine? Keep out of the reach of children. Store at room temperature or as directed on the package label. Protect from moisture. Do not freeze. Throw away any unused medicine after the expiration date. NOTE: This sheet is a summary. It may not cover all possible information. If you have questions about this medicine, talk to your doctor, pharmacist, or health care provider.  2015, Elsevier/Gold Standard. (2007-03-09 13:05:24)

## 2013-10-10 NOTE — Assessment & Plan Note (Signed)
Lab Results  Component Value Date   HGBA1C 6.3 10/10/2013   HGBA1C 6.4 07/05/2013   HGBA1C 6.6 03/21/2013     Assessment: Diabetes control: good control (HgbA1C at goal) Progress toward A1C goal:  at goal  Plan: Medications:  continue current medications:  Metformin 1000mg  BID Home glucose monitoring: Frequency: no home glucose monitoring Timing:   Instruction/counseling given: discussed foot care and discussed diet Educational resources provided: brochure Other plans: She is doing well.  RTC in 6 months.

## 2013-10-10 NOTE — Assessment & Plan Note (Signed)
She describes 1 day of vaginal bleeding that occurred three months ago.  She has been evaluated by Gyn and is scheduled for endometrial biopsy on 11/05/2013.  We discussed the fact that vaginal and endometrial atrophy are common causes of post-menopausal bleeding but she should proceed with biopsy to rule out endometrial cancer.

## 2013-10-10 NOTE — Assessment & Plan Note (Signed)
Stable. No dyspnea.

## 2013-10-10 NOTE — Assessment & Plan Note (Signed)
BP Readings from Last 3 Encounters:  10/10/13 138/78  10/01/13 145/81  08/08/13 125/83    Lab Results  Component Value Date   NA 141 08/02/2013   K 3.8 08/02/2013   CREATININE 0.73 08/02/2013    Assessment: Blood pressure control: controlled Progress toward BP goal:  at goal  Plan: Medications:  continue current medications:  Losartan 50mg  daily, HCTZ 25mg  daily Educational resources provided: brochure Other plans:  She is doing well.  RTC in 6 months.

## 2013-10-10 NOTE — Assessment & Plan Note (Signed)
Flu shot provided today.  Records indicate she is due for ophthalmologic exam, however she says she has seen ophthalmologist this year.  I will attempt to send for records.

## 2013-10-10 NOTE — Assessment & Plan Note (Addendum)
She is compliant with pravastatin.  Will repeat lipid panel today since it has been more than 1 year since last check.  Based on new labs, if her 10 year ASCVD risk is higher than 7.5% she will require switch to high intensity statin.  If risk is < 7.5% she can remain on pravastatin.  Janice Massey asked if she should take fish oil because she heard it was good for you.  I explained that there is research supporting the use omega 3s for CVD prevention but there are also some studies that fail to demonstrate benefit.  Given potential for benefit and limited risk with supplemental dose I informed her that there was not a problem with her taking fish oil.  I did inform her that it was best to get DHA/EPA from diet including fatty fish and I provided her with a handout with fish that provide DHA/EPA.  I also provided her with a handout on fish oils in general.

## 2013-10-10 NOTE — Progress Notes (Signed)
   Subjective:    Patient ID: Janice Massey, female    DOB: 1955-08-29, 58 y.o.   MRN: [id number]  HPI Comments: Ms. Schillo is a 58 year old woman with a PMH of deafness, HTN, VSD, DM type 2, HLD and GERD here for routine follow-up.  Please see problem based A&P for update of chronic conditions.      Review of Systems  Constitutional: Negative for fever, chills, appetite change and unexpected weight change.  Eyes: Negative for visual disturbance.  Respiratory: Negative for shortness of breath.   Cardiovascular: Negative for chest pain and palpitations.  Gastrointestinal: Negative for nausea, vomiting, abdominal pain, diarrhea, constipation and blood in stool.  Endocrine: Negative for polydipsia and polyuria.  Genitourinary: Negative for dysuria.  Neurological: Negative for weakness and light-headedness.  Psychiatric/Behavioral: Negative for dysphoric mood.       Objective:   Physical Exam  Vitals reviewed. Constitutional: She is oriented to person, place, and time. She appears well-developed. No distress.  HENT:  Head: Normocephalic and atraumatic.  Mouth/Throat: Oropharynx is clear and moist. No oropharyngeal exudate.  Eyes: EOM are normal. Pupils are equal, round, and reactive to light.  Neck: Neck supple.  Cardiovascular: Normal rate and regular rhythm.  Exam reveals no gallop and no friction rub.   Murmur heard. Pulmonary/Chest: Effort normal and breath sounds normal. No respiratory distress. She has no wheezes. She has no rales.  Abdominal: Soft. Bowel sounds are normal. She exhibits no distension. There is no tenderness. There is no rebound.  Musculoskeletal: Normal range of motion. She exhibits no edema and no tenderness.  Neurological: She is alert and oriented to person, place, and time. No cranial nerve deficit.  Skin: Skin is warm. She is not diaphoretic.  No ulcerations feet.  Psychiatric: She has a normal mood and affect. Her behavior is normal.            Assessment & Plan:  Please see problem based assessment and plan.

## 2013-10-10 NOTE — Assessment & Plan Note (Signed)
Her jaw pain has resolved and labs were within normal limits.

## 2013-10-11 LAB — LIPID PANEL
Cholesterol: 140 mg/dL (ref 0–200)
HDL: 55 mg/dL (ref >39)
LDL CALC: 51 mg/dL (ref 0–99)
TRIGLYCERIDES: 172 mg/dL — AB (ref <150)
Total CHOL/HDL Ratio: 2.5 Ratio
VLDL: 34 mg/dL (ref 0–40)

## 2013-10-15 NOTE — Progress Notes (Signed)
Case discussed with Dr. Wilson soon after the resident saw the patient.  We reviewed the resident's history and exam and pertinent patient test results.  I agree with the assessment, diagnosis and plan of care documented in the resident's note. 

## 2013-11-02 ENCOUNTER — Other Ambulatory Visit: Payer: Self-pay | Admitting: Internal Medicine

## 2013-11-05 ENCOUNTER — Other Ambulatory Visit: Payer: Self-pay | Admitting: Internal Medicine

## 2013-11-05 ENCOUNTER — Ambulatory Visit (INDEPENDENT_AMBULATORY_CARE_PROVIDER_SITE_OTHER): Payer: Medicaid Other | Admitting: Obstetrics and Gynecology

## 2013-11-05 ENCOUNTER — Other Ambulatory Visit (HOSPITAL_COMMUNITY)
Admission: RE | Admit: 2013-11-05 | Discharge: 2013-11-05 | Disposition: A | Discharge status: Home / Self Care | Payer: Medicaid Other | Source: Ambulatory Visit | Attending: Obstetrics and Gynecology | Admitting: Obstetrics and Gynecology

## 2013-11-05 ENCOUNTER — Encounter: Payer: Self-pay | Admitting: Obstetrics and Gynecology

## 2013-11-05 VITALS — BP 132/75 | HR 86 | Temp 100.1°F | Wt 185.5 lb

## 2013-11-05 DIAGNOSIS — N950 Postmenopausal bleeding: Secondary | ICD-10-CM | POA: Insufficient documentation

## 2013-11-05 MED ORDER — ATORVASTATIN CALCIUM 80 MG PO TABS: 80.0000 mg | ORAL_TABLET | Freq: Every day | ORAL | Status: DC

## 2013-11-05 NOTE — Telephone Encounter (Signed)
Pharmacy called and Pravastatin has been closed out, Rx for Atorvastatin 80 mg given

## 2013-11-05 NOTE — Progress Notes (Signed)
Patient ID: Janice Massey, female   DOB: 1955-12-23, 58 y.o.   MRN: [id number] 58 yo G2P2 who returns today for endometrial biopsy. Patient has been menopausal for the past 7 years or so and has experienced vaginal bleeding in August and September. She has had no bleeding since her last visit in September 2015.   GENERAL: Well-developed, well-nourished female in no acute distress. Obese ABDOMEN: Soft, nontender, nondistended. No organomegaly. PELVIC: Normal external female genitalia. Vagina is pink and rugated.  Normal discharge. Normal appearing cervix flush with vaginal vault. Bimanual exam limited secondary to body habitus. No adnexal mass or tenderness. EXTREMITIES: No cyanosis, clubbing, or edema, 2+ distal pulses.  A/P 58 yo with postmenopausal vaginal bleeding -Endometrial biopsy performed ENDOMETRIAL BIOPSY     The indications for endometrial biopsy were reviewed.   Risks of the biopsy including cramping, bleeding, infection, uterine perforation, inadequate specimen and need for additional procedures  were discussed. The patient states she understands and agrees to undergo procedure today. Consent was signed. Time out was performed. Urine HCG was negative. A sterile speculum was placed in the patient's vagina and the cervix was prepped with Betadine. A single-toothed tenaculum was placed on the anterior lip of the cervix to stabilize it. The uterine cavity was sounded to a depth of 6 cm using the uterine sound. The 3 mm pipelle was introduced into the endometrial cavity without difficulty, 2 passes were made.  A  moderate amount of tissue was  sent to pathology. The instruments were removed from the patient's vagina. Minimal bleeding from the cervix was noted. The patient tolerated the procedure well.  Routine post-procedure instructions were given to the patient. The patient will follow up in two weeks to review the results and for further management.

## 2013-11-06 ENCOUNTER — Telehealth: Payer: Self-pay

## 2013-11-06 NOTE — Telephone Encounter (Signed)
-----   Message from Mora Bellman, MD sent at 11/06/2013  2:12 PM EST ----- Please inform patient of negative endometrial biopsy. Patient should keep track of vaginal bleeding episodes and should return to clinic if it recurs. No concern for endometrial cancer at this time.  Thanks  [employer]

## 2013-11-06 NOTE — Telephone Encounter (Signed)
Attemtped to call patient. Woman named College picked up and stated patient was not available. Asked if she would have patient call clinic when she gets in. She stated she would.

## 2013-11-07 NOTE — Telephone Encounter (Signed)
Called patient and informed her of results and recommendations. Patient verbalized understanding and had no questions

## 2013-11-21 ENCOUNTER — Other Ambulatory Visit: Payer: Self-pay | Admitting: Internal Medicine

## 2013-12-03 ENCOUNTER — Other Ambulatory Visit: Payer: Self-pay | Admitting: *Deleted

## 2013-12-03 MED ORDER — LOSARTAN POTASSIUM 50 MG PO TABS
50.0000 mg | ORAL_TABLET | Freq: Every day | ORAL | Status: DC
Start: 2013-12-03 — End: 2014-03-28

## 2013-12-21 ENCOUNTER — Encounter: Payer: Self-pay | Admitting: *Deleted

## 2013-12-21 ENCOUNTER — Other Ambulatory Visit: Payer: Self-pay | Admitting: Internal Medicine

## 2014-01-02 ENCOUNTER — Telehealth: Payer: Self-pay | Admitting: *Deleted

## 2014-01-02 NOTE — Telephone Encounter (Signed)
LVM for pt to return call to see about scheduling a nurse visit for a flu shot.Brandi Mendez, Brandi Mendez  

## 2014-01-07 ENCOUNTER — Encounter: Payer: Self-pay | Admitting: *Deleted

## 2014-01-07 LAB — HM DIABETES EYE EXAM

## 2014-01-20 ENCOUNTER — Encounter (HOSPITAL_COMMUNITY): Payer: Self-pay | Admitting: Nurse Practitioner

## 2014-01-20 ENCOUNTER — Emergency Department (HOSPITAL_COMMUNITY)
Admission: EM | Admit: 2014-01-20 | Discharge: 2014-01-20 | Disposition: A | Discharge status: Home / Self Care | Payer: Self-pay | Attending: Emergency Medicine | Admitting: Emergency Medicine

## 2014-01-20 ENCOUNTER — Emergency Department (HOSPITAL_COMMUNITY): Payer: Self-pay

## 2014-01-20 DIAGNOSIS — N390 Urinary tract infection, site not specified: Secondary | ICD-10-CM | POA: Insufficient documentation

## 2014-01-20 DIAGNOSIS — R1013 Epigastric pain: Secondary | ICD-10-CM

## 2014-01-20 DIAGNOSIS — Z90710 Acquired absence of both cervix and uterus: Secondary | ICD-10-CM | POA: Insufficient documentation

## 2014-01-20 DIAGNOSIS — D72829 Elevated white blood cell count, unspecified: Secondary | ICD-10-CM | POA: Insufficient documentation

## 2014-01-20 DIAGNOSIS — Z8719 Personal history of other diseases of the digestive system: Secondary | ICD-10-CM | POA: Insufficient documentation

## 2014-01-20 DIAGNOSIS — R52 Pain, unspecified: Secondary | ICD-10-CM

## 2014-01-20 DIAGNOSIS — Z7951 Long term (current) use of inhaled steroids: Secondary | ICD-10-CM | POA: Insufficient documentation

## 2014-01-20 DIAGNOSIS — Z9049 Acquired absence of other specified parts of digestive tract: Secondary | ICD-10-CM | POA: Insufficient documentation

## 2014-01-20 LAB — CBC WITH DIFFERENTIAL/PLATELET
BASOS ABS: 0 10*3/uL (ref 0.0–0.1)
Basophils Relative: 0 % (ref 0–1)
EOS PCT: 1 % (ref 0–5)
Eosinophils Absolute: 0.1 10*3/uL (ref 0.0–0.7)
HCT: 35.4 % — ABNORMAL LOW (ref 36.0–46.0)
Hemoglobin: 12.4 g/dL (ref 12.0–15.0)
Lymphocytes Relative: 36 % (ref 12–46)
Lymphs Abs: 2.3 10*3/uL (ref 0.7–4.0)
MCH: 31.9 pg (ref 26.0–34.0)
MCHC: 35 g/dL (ref 30.0–36.0)
MCV: 91 fL (ref 78.0–100.0)
MONO ABS: 0.4 10*3/uL (ref 0.1–1.0)
MONOS PCT: 7 % (ref 3–12)
NEUTROS PCT: 56 % (ref 43–77)
Neutro Abs: 3.6 10*3/uL (ref 1.7–7.7)
PLATELETS: 243 10*3/uL (ref 150–400)
RBC: 3.89 MIL/uL (ref 3.87–5.11)
RDW: 13.1 % (ref 11.5–15.5)
WBC: 6.4 10*3/uL (ref 4.0–10.5)

## 2014-01-20 LAB — COMPREHENSIVE METABOLIC PANEL
ALT: 13 U/L (ref 0–35)
AST: 18 U/L (ref 0–37)
Albumin: 4.1 g/dL (ref 3.5–5.2)
Alkaline Phosphatase: 69 U/L (ref 39–117)
Anion gap: 7 (ref 5–15)
BUN: 18 mg/dL (ref 6–23)
CALCIUM: 9.3 mg/dL (ref 8.4–10.5)
CO2: 27 mmol/L (ref 19–32)
Chloride: 108 mEq/L (ref 96–112)
Creatinine, Ser: 0.76 mg/dL (ref 0.50–1.10)
GFR calc Af Amer: >90 mL/min (ref >90)
Glucose, Bld: 103 mg/dL — ABNORMAL HIGH (ref 70–99)
Potassium: 3.7 mmol/L (ref 3.5–5.1)
SODIUM: 142 mmol/L (ref 135–145)
TOTAL PROTEIN: 7 g/dL (ref 6.0–8.3)
Total Bilirubin: 1 mg/dL (ref 0.3–1.2)

## 2014-01-20 LAB — URINALYSIS, ROUTINE W REFLEX MICROSCOPIC
Glucose, UA: NEGATIVE mg/dL
Ketones, ur: NEGATIVE mg/dL
Nitrite: NEGATIVE
PROTEIN: NEGATIVE mg/dL
Specific Gravity, Urine: 1.028 (ref 1.005–1.030)
Urobilinogen, UA: 1 mg/dL (ref 0.0–1.0)
pH: 5 (ref 5.0–8.0)

## 2014-01-20 LAB — URINE MICROSCOPIC-ADD ON

## 2014-01-20 MED ORDER — LEVOFLOXACIN 250 MG PO TABS: 750.0000 mg | ORAL_TABLET | Freq: Every day | ORAL | Status: DC

## 2014-01-20 MED ORDER — ACETAMINOPHEN 325 MG PO TABS
650.0000 mg | ORAL_TABLET | Freq: Once | ORAL | Status: AC
  Administered 2014-01-20: 650 mg via ORAL
  Filled 2014-01-20: qty 2

## 2014-01-20 NOTE — ED Provider Notes (Signed)
Complains of epigastric pain constant for 2 days. Pain is not affected by eating not affected by exertion she denies chest pain discomfort is improving steadily with time her stomach feels "sour" she denies nausea or vomiting she is presently hungry she continues to pass gas per rectum. She states pain may feel like bowel obstruction she's had in the past however at that time she was vomiting and not passing gas per rectum No other associated symptoms. No treatment prior to coming here. On exam alert no distress abdomen nondistended normal active bowel sounds, midline surgical scar, minimal tenderness at epigastrium. Clinical suspicion for bowel obstruction is low. Pain is improving with time. Presently hungry. Abdomen nondistended. Doubt cardiac etiology of pain. Discomfort nonexertional, constant for 2 days. Doubt retained common duct stone. Normal LFTs. No vomiting.  Doug SouSam Telisha Zawadzki, MD 01/21/14 [phone]

## 2014-01-20 NOTE — ED Notes (Signed)
Patient returned from X-ray 

## 2014-01-20 NOTE — ED Notes (Signed)
She c/o generalized abd pain x 2 days. "feels like i have a sour belly." denies vomiting, bowel changes. Reports her urine has been darker. Her last BM was today and normal. She reports she has been able to tolerate normal oral intake

## 2014-01-20 NOTE — Discharge Instructions (Signed)
Return to the emergency room with worsening of symptoms, new symptoms or with symptoms that are concerning, especially fevers, nausea, vomiting, unable to pass stool or gas, abdominal swelling and bloating, severe abdominal pain. Follow up with your primary care provider. Zantac  daily for one week. Please take all of your antibiotics until finished!   You may develop abdominal discomfort or diarrhea from the antibiotic.  You may help offset this with probiotics which you can buy or get in yogurt. Do not eat  or take the probiotics until 2 hours after your antibiotic.    Levofloxacin tablets What is this medicine? LEVOFLOXACIN (Thomasa voe FLOX a sin) is a quinolone antibiotic. It is used to treat certain kinds of bacterial infections. It will not work for colds, flu, or other viral infections. This medicine may be used for other purposes; ask your health care provider or pharmacist if you have questions. COMMON BRAND NAME(S): Levaquin, Levaquin Leva-Pak What should I tell my health care provider before I take this medicine? They need to know if you have any of these conditions: -cerebral disease -irregular heartbeat -kidney disease -seizure disorder -an unusual or allergic reaction to levofloxacin, other antibiotics or medicines, foods, dyes, or preservatives -pregnant or trying to get pregnant -breast-feeding How should I use this medicine? Take this medicine by mouth with a full glass of water. Follow the directions on the prescription label. This medicine can be taken with or without food. Take your medicine at regular intervals. Do not take your medicine more often than directed. Do not skip doses or stop your medicine early even if you feel better. Do not stop taking except on your doctor's advice. A special MedGuide will be given to you by the pharmacist with each prescription and refill. Be sure to read this information carefully each time. Talk to your pediatrician regarding the use  of this medicine in children. While this drug may be prescribed for children as young as 6 months for selected conditions, precautions do apply. Overdosage: If you think you have taken too much of this medicine contact a poison control center or emergency room at once. NOTE: This medicine is only for you. Do not share this medicine with others. What if I miss a dose? If you miss a dose, take it as soon as you remember. If it is almost time for your next dose, take only that dose. Do not take double or extra doses. What may interact with this medicine? Do not take this medicine with any of the following medications: -arsenic trioxide -chloroquine -droperidol -medicines for irregular heart rhythm like amiodarone, disopyramide, dofetilide, flecainide, quinidine, procainamide, sotalol -some medicines for depression or mental problems like phenothiazines, pimozide, and ziprasidone This medicine may also interact with the following medications: -amoxapine -antacids -birth control pills -cisapride -dairy products -didanosine (ddI) buffered tablets or powder -haloperidol -multivitamins -NSAIDS, medicines for pain and inflammation, like ibuprofen or naproxen -retinoid products like tretinoin or isotretinoin -risperidone -some other antibiotics like clarithromycin or erythromycin -sucralfate -theophylline -warfarin This list may not describe all possible interactions. Give your health care provider a list of all the medicines, herbs, non-prescription drugs, or dietary supplements you use. Also tell them if you smoke, drink alcohol, or use illegal drugs. Some items may interact with your medicine. What should I watch for while using this medicine? Tell your doctor or health care professional if your symptoms do not improve or if they get worse. Drink several glasses of water a day and cut down on  drinks that contain caffeine. You must not get dehydrated while taking this medicine. You may get  drowsy or dizzy. Do not drive, use machinery, or do anything that needs mental alertness until you know how this medicine affects you. Do not sit or stand up quickly, especially if you are an older patient. This reduces the risk of dizzy or fainting spells. This medicine can make you more sensitive to the sun. Keep out of the sun. If you cannot avoid being in the sun, wear protective clothing and use a sunscreen. Do not use sun lamps or tanning beds/booths. Contact your doctor if you get a sunburn. If you are a diabetic monitor your blood glucose carefully. If you get an unusual reading stop taking this medicine and call your doctor right away. Do not treat diarrhea with over-the-counter products. Contact your doctor if you have diarrhea that lasts more than 2 days or if the diarrhea is severe and watery. Avoid antacids, calcium, iron, and zinc products for 2 hours before and 2 hours after taking a dose of this medicine. What side effects may I notice from receiving this medicine? Side effects that you should report to your doctor or health care professional as soon as possible: -allergic reactions like skin rash or hives, swelling of the face, lips, or tongue -changes in vision -confusion, nightmares or hallucinations -difficulty breathing -irregular heartbeat, chest pain -joint, muscle or tendon pain -pain or difficulty passing urine -persistent headache with or without blurred vision -redness, blistering, peeling or loosening of the skin, including inside the mouth -seizures -unusual pain, numbness, tingling, or weakness -vaginal irritation, discharge Side effects that usually do not require medical attention (report to your doctor or health care professional if they continue or are bothersome): -diarrhea -dry mouth -headache -stomach upset, nausea -trouble sleeping This list may not describe all possible side effects. Call your doctor for medical advice about side effects. You may report  side effects to FDA at 1-800-FDA-1088. Where should I keep my medicine? Keep out of the reach of children. Store at room temperature between 15 and 30 degrees C (59 and 86 degrees F). Keep in a tightly closed container. Throw away any unused medicine after the expiration date. NOTE: This sheet is a summary. It may not cover all possible information. If you have questions about this medicine, talk to your doctor, pharmacist, or health care provider.  2015, Elsevier/Gold Standard. (2012-07-28 07:45:07) Urinary Tract Infection Urinary tract infections (UTIs) can develop anywhere along your urinary tract. Your urinary tract is your body's drainage system for removing wastes and extra water. Your urinary tract includes two kidneys, two ureters, a bladder, and a urethra. Your kidneys are a pair of bean-shaped organs. Each kidney is about the size of your fist. They are located below your ribs, one on each side of your spine. CAUSES Infections are caused by microbes, which are microscopic organisms, including fungi, viruses, and bacteria. These organisms are so small that they can only be seen through a microscope. Bacteria are the microbes that most commonly cause UTIs. SYMPTOMS  Symptoms of UTIs may vary by age and gender of the patient and by the location of the infection. Symptoms in young women typically include a frequent and intense urge to urinate and a painful, burning feeling in the bladder or urethra during urination. Older women and men are more likely to be tired, shaky, and weak and have muscle aches and abdominal pain. A fever may mean the infection is in your kidneys.  Other symptoms of a kidney infection include pain in your back or sides below the ribs, nausea, and vomiting. DIAGNOSIS To diagnose a UTI, your caregiver will ask you about your symptoms. Your caregiver also will ask to provide a urine sample. The urine sample will be tested for bacteria and white blood cells. White blood cells  are made by your body to help fight infection. TREATMENT  Typically, UTIs can be treated with medication. Because most UTIs are caused by a bacterial infection, they usually can be treated with the use of antibiotics. The choice of antibiotic and length of treatment depend on your symptoms and the type of bacteria causing your infection. HOME CARE INSTRUCTIONS  If you were prescribed antibiotics, take them exactly as your caregiver instructs you. Finish the medication even if you feel better after you have only taken some of the medication.  Drink enough water and fluids to keep your urine clear or pale yellow.  Avoid caffeine, tea, and carbonated beverages. They tend to irritate your bladder.  Empty your bladder often. Avoid holding urine for long periods of time.  Empty your bladder before and after sexual intercourse.  After a bowel movement, women should cleanse from front to back. Use each tissue only once. SEEK MEDICAL CARE IF:   You have back pain.  You develop a fever.  Your symptoms do not begin to resolve within 3 days. SEEK IMMEDIATE MEDICAL CARE IF:   You have severe back pain or lower abdominal pain.  You develop chills.  You have nausea or vomiting.  You have continued burning or discomfort with urination. MAKE SURE YOU:   Understand these instructions.  Will watch your condition.  Will get help right away if you are not doing well or get worse. Document Released: 09/30/2004 Document Revised: 06/22/2011 Document Reviewed: 01/29/2011 [facility] Patient Information 2015 [place], [place]. This information is not intended to replace advice given to you by your health care provider. Make sure you discuss any questions you have with your health care provider.

## 2014-01-20 NOTE — ED Provider Notes (Signed)
CSN: [id number]     Arrival date & time 01/20/14  1244 History   First MD Initiated Contact with Patient 01/20/14 1558     Chief Complaint  Patient presents with  . Abdominal Pain     (Consider location/radiation/quality/duration/timing/severity/associated sxs/prior Treatment) HPI  Gershon MusselLee N Thau is a 59 y.o. female with PMH of small bowel obstruction 2013, abdominal hysterectomy, cholecystectomy, abdominal hernia repair presenting with 2 days of generalized abdominal pain that was intermittent and now has become more persistent. She describes having a "sour belly" with a sour taste in her mouth. She denies taking anything for this. She states it is worse with eating. She reports eating less due to the discomfort. She has had no new foods and no alcohol. She denies any nausea, vomiting, diarrhea, hematochezia. Last BM today and normal. Patient does report her urine is darker than normal and she has a burning sensation. No other urinary symptoms. She denies vaginal symptoms.   Past Medical History  Diagnosis Date  . Small bowel obstruction 2013   Past Surgical History  Procedure Laterality Date  . Abdominal hysterectomy    . Cholecystectomy    . Abdominal hernia repair     History reviewed. No pertinent family history. History  Substance Use Topics  . Smoking status: Never Smoker   . Smokeless tobacco: Not on file  . Alcohol Use: Yes     Comment: sometimes   OB History    No data available     Review of Systems 10 Systems reviewed and are negative for acute change except as noted in the HPI.    Allergies  Review of patient's allergies indicates no known allergies.  Home Medications   Prior to Admission medications   Medication Sig Start Date End Date Taking? Authorizing Provider  benzonatate (TESSALON) 200 MG capsule Take 1 capsule (200 mg total) by mouth 3 (three) times daily as needed for cough. 04/02/13   Elson AreasLeslie K Sofia, PA-C  fluticasone [facility](FLONASE) 50 MCG/ACT nasal  spray Place 2 sprays into both nostrils daily. 04/10/13   Garnetta BuddyEdward Williamson V, MD  guaiFENesin-codeine 100-10 MG/5ML syrup Take 5 mLs by mouth every 6 (six) hours as needed for cough. 04/10/13   Garnetta BuddyEdward Williamson V, MD  levofloxacin (LEVAQUIN) 250 MG tablet Take 3 tablets (750 mg total) by mouth daily. 01/20/14   Louann SjogrenVictoria L Debborah Alonge, PA-C  nitroGLYCERIN (NITROSTAT) 0.4 MG SL tablet Place 1 tablet (0.4 mg total) under the tongue every 5 (five) minutes as needed for chest pain. 10/05/12   Elenora GammaSamuel L Bradshaw, MD  ondansetron (ZOFRAN ODT) 4 MG disintegrating tablet Take 1 tablet (4 mg total) by mouth every 8 (eight) hours as needed for nausea or vomiting. 02/17/13   Gerhard Munchobert Lockwood, MD   BP 120/63 mmHg  Pulse 49  Temp(Src) 97.5 F (36.4 C) (Oral)  Resp 18  Ht 5\' 6"  (1.676 m)  Wt 178 lb (80.74 kg)  BMI 28.74 kg/m2  SpO2 100% Physical Exam  Constitutional: She appears well-developed and well-nourished. No distress.  HENT:  Head: Normocephalic and atraumatic.  Mouth/Throat: Oropharynx is clear and moist.  Eyes: Conjunctivae and EOM are normal. Right eye exhibits no discharge. Left eye exhibits no discharge.  Cardiovascular: Normal rate, regular rhythm and normal heart sounds.   Pulmonary/Chest: Effort normal and breath sounds normal. No respiratory distress. She has no wheezes.  Abdominal: Soft. Bowel sounds are normal. She exhibits no distension.  Right upper quadrant and epigastric abdominal tenderness without rebound, rigidity, guarding. No CVA  tenderness.  Neurological: She is alert. She exhibits normal muscle tone. Coordination normal.  Skin: Skin is warm and dry. She is not diaphoretic.  Nursing note and vitals reviewed.   ED Course  Procedures (including critical care time) Labs Review Labs Reviewed  COMPREHENSIVE METABOLIC PANEL - Abnormal; Notable for the following:    Glucose, Bld 103 (*)    All other components within normal limits  CBC WITH DIFFERENTIAL - Abnormal; Notable for the  following:    HCT 35.4 (*)    All other components within normal limits  URINALYSIS, ROUTINE W REFLEX MICROSCOPIC - Abnormal; Notable for the following:    Hgb urine dipstick TRACE (*)    Bilirubin Urine SMALL (*)    Leukocytes, UA MODERATE (*)    All other components within normal limits  URINE MICROSCOPIC-ADD ON - Abnormal; Notable for the following:    Crystals CA OXALATE CRYSTALS (*)    All other components within normal limits    Imaging Review Dg Abd Acute W/chest  01/20/2014   CLINICAL DATA:  Left chest pain. Shortness of breath. Anterior abdominal soreness.  EXAM: ACUTE ABDOMEN SERIES (ABDOMEN 2 VIEW & CHEST 1 VIEW)  COMPARISON:  Multiple exams, including 04/02/2013 and 08/15/2012  FINDINGS: Biapical pleural parenchymal scarring. Cardiac and mediastinal margins appear normal. The lungs appear otherwise clear. No pleural effusion identified.  Clips noted in the right upper quadrant. Elongated right hepatic lobe that 26 cm. Hernia mesh markers observed projecting over the pelvis. Vascular calcifications in the lower anatomic pelvis along with several vascular clips.  Unremarkable bowel gas pattern. Several right ovarian vein calcifications are present.  IMPRESSION: 1. Unremarkable bowel gas pattern.  No acute thoracic findings. 2. Biapical pleural parenchymal scarring. 3. Hernia mesh markers noted. 4. Mildly elongated right hepatic lobe.   Electronically Signed   By: Herbie Baltimore M.D.   On: 01/20/2014 17:18     EKG Interpretation None      MDM   Final diagnoses:  Pain  UTI (lower urinary tract infection)  Epigastric abdominal pain   Patient with history of small bowel obstruction in 2013 with multiple abdominal surgeries presenting with epigastric abdominal tenderness for 2 days. Patient denies nausea, vomiting. She has had vomiting today and is passing gas. VSS. Patient without distension, mild epigastric and right upper quadrant tenderness without evidence of peritonitis.  Acute abdominal series without air-fluid levels or bowel dilation. I doubt small bowel obstruction at this time. Patient given strict return precautions including development of nausea, vomiting, distention. Patient improved with Tylenol. Patient also with urinary symptoms and moderate leukocytes and 3-6 white blood cells. We'll treat for urinary tract infection with Levaquin. Pt to follow up with PCP.   Discussed return precautions with patient. Discussed all results and patient verbalizes understanding and agrees with plan.  This is a shared patient. This patient was discussed with the physician, Dr. Ethelda Chick who saw and evaluated the patient and agrees with the plan.     Louann Sjogren, PA-C 01/20/14 2157  Doug Sou, MD 01/21/14 [phone]

## 2014-02-04 ENCOUNTER — Ambulatory Visit: Payer: Self-pay

## 2014-03-07 ENCOUNTER — Encounter: Payer: Self-pay | Admitting: Family Medicine

## 2014-03-07 ENCOUNTER — Ambulatory Visit (INDEPENDENT_AMBULATORY_CARE_PROVIDER_SITE_OTHER): Payer: Self-pay | Admitting: Family Medicine

## 2014-03-07 VITALS — BP 114/80 | HR 88 | Temp 97.7°F | Ht 64.0 in | Wt 178.0 lb

## 2014-03-07 DIAGNOSIS — R8290 Unspecified abnormal findings in urine: Secondary | ICD-10-CM

## 2014-03-07 DIAGNOSIS — N390 Urinary tract infection, site not specified: Secondary | ICD-10-CM | POA: Insufficient documentation

## 2014-03-07 DIAGNOSIS — R1013 Epigastric pain: Secondary | ICD-10-CM

## 2014-03-07 LAB — POCT URINALYSIS DIPSTICK
BILIRUBIN UA: NEGATIVE
GLUCOSE UA: NEGATIVE
KETONES UA: NEGATIVE
Nitrite, UA: NEGATIVE
Protein, UA: NEGATIVE
Spec Grav, UA: >=1.03
Urobilinogen, UA: 2
pH, UA: 5.5

## 2014-03-07 LAB — POCT UA - MICROSCOPIC ONLY

## 2014-03-07 MED ORDER — SUCRALFATE 1 G PO TABS: 1.0000 g | ORAL_TABLET | Freq: Three times a day (TID) | ORAL | Status: DC

## 2014-03-07 NOTE — Assessment & Plan Note (Signed)
UA and UCx collected today Will f/u with results and treat as indicated

## 2014-03-07 NOTE — Addendum Note (Signed)
Addended by: SwazilandJORDAN, Viren Lebeau on: 03/07/2014 05:56 PM   Modules accepted: Orders

## 2014-03-07 NOTE — Patient Instructions (Signed)
It was nice to meet you today.  I'm prescribing a medicine that can help with your belly pain by coating the inside of your stomach. I'm also referring you to gastroenterology as I think they may need them to look at the inside of your stomach as you've had many episodes of this.  We will get a urine sample to see if you have a urinary tract infection causing your malodorous urine.  Take care, Dr. Leonard SchwartzB  Indigestion Indigestion is discomfort in the upper abdomen that is caused by underlying problems such as gastroesophageal reflux disease (GERD), ulcers, or gallbladder problems.  CAUSES  Indigestion can be caused by many things. Possible causes include:  Stomach acid in the esophagus.  Stomach infections, usually caused by the bacteria H. pylori.  Being overweight.  Hiatal hernia. This means part of the stomach pushes up through the diaphragm.  Overeating.  Emotional problems, such as stress, anxiety, or depression.  Poor nutrition.  Consuming too much alcohol, tobacco, or caffeine.  Consuming spicy foods, fats, peppermint, chocolate, tomato products, citrus, or fruit juices.  Medicines such as aspirin and other anti-inflammatory drugs, hormones, steroids, and thyroid medicines.  Gastroparesis. This is a condition in which the stomach does not empty properly.  Stomach cancer.  Pregnancy, due to an increase in hormone levels, a relaxation of muscles in the digestive tract, and pressure on the stomach from the growing fetus. SYMPTOMS   Uncomfortable feeling of fullness after eating.  Pain or burning sensation in the upper abdomen.  Bloating.  Belching and gas.  Nausea and vomiting.  Acidic taste in the mouth.  Burning sensation in the chest (heartburn). DIAGNOSIS  Your caregiver will review your medical history and perform a physical exam. Other tests, such as blood tests, stool tests, X-rays, and other imaging scans, may be done to check for more serious  problems. TREATMENT  Liquid antacids and other drugs may be given to block stomach acid secretion. Medicines that increase esophageal muscle tone may also be given to help reduce symptoms. If an infection is found, antibiotic medicine may be given. HOME CARE INSTRUCTIONS  Avoid foods and drinks that make your symptoms worse, such as:  Caffeine or alcoholic drinks.  Chocolate.  Peppermint or mint flavorings.  Garlic and onions.  Spicy foods.  Citrus fruits, such as oranges, lemons, or limes.  Tomato-based foods such as sauce, chili, salsa, and pizza.  Fried and fatty foods.  Avoid eating for the 3 hours prior to your bedtime.  Eat small, frequent meals instead of large meals.  Stop smoking if you smoke.  Maintain a healthy weight.  Wear loose-fitting clothing. Do not wear anything tight around your waist that causes pressure on your stomach.  Raise the head of your bed 4 to 8 inches with wood blocks to help you sleep. Extra pillows will not help.  Only take over-the-counter or prescription medicines as directed by your caregiver.  Do not take aspirin, ibuprofen, or other nonsteroidal anti-inflammatory drugs (NSAIDs). SEEK IMMEDIATE MEDICAL CARE IF:   You are not better after 2 days.  You have chest pressure or pain that radiates up into your neck, arms, back, jaw, or upper abdomen.  You have difficulty swallowing.  You keep vomiting.  You have black or bloody stools.  You have a fever.  You have dizziness, fainting, difficulty breathing, or heavy sweating.  You have severe abdominal pain.  You lose weight without trying. MAKE SURE YOU:  Understand these instructions.  Will watch your  condition.  Will get help right away if you are not doing well or get worse. Document Released: 01/29/2004 Document Revised: 03/15/2011 Document Reviewed: 08/05/2010 [facility] Patient Information 2015 [place], [place]. This information is not intended to replace advice given  to you by your health care provider. Make sure you discuss any questions you have with your health care provider.

## 2014-03-07 NOTE — Progress Notes (Signed)
   Subjective:   Brandi Mendez is a 59 y.o. female with a history of SBO, constipation, dyspepsia, dysuria here for abdominal pain, malodorous urine.  abd pain:  - epigastric nonradiating tightening/cramping with eating x2-3d - Has had dyspepsia in the past and this is a similar pain - Tried tylenol without relief - No N/V/D/constipation - Tried pepcid in past without relief - h/o neg H pylori  Odor with urination: x1-2 weeks - No dysuria, hematuria +suprapubic pain  havign urianry incontinence  Review of Systems:  Per HPI. All other systems reviewed and are negative.   PMH, PSH, Medications, Allergies, and FmHx reviewed and updated in EMR.  Social History: never smoker  Objective:  BP 114/80 mmHg  Pulse 88  Temp(Src) 97.7 F (36.5 C) (Oral)  Ht 5\' 4"  (1.626 m)  Wt 178 lb (80.74 kg)  BMI 30.54 kg/m2  Gen:  59 y.o. female in NAD HEENT: NCAT, MMM CV: RRR, no MRG Resp: Non-labored, CTAB, no wheezes noted Abd: Soft, ND, TTP in epigastrium, BS present, no guarding or organomegaly Ext: WWP, no edema Neuro: Alert and oriented, speech normal    Assessment:     Brandi Mendez is a 59 y.o. female here for epigastric pain and malodorous urine.    Plan:     See problem list for problem-specific plans.   Shirlee LatchAngela Abimelec Grochowski, MD PGY-1,  [facility] 03/07/2014  2:53 PM

## 2014-03-07 NOTE — Assessment & Plan Note (Signed)
Recent lipase and H pylori negative No relief with pepcid previously Trial of carafate for symptom management Referral to GI for possible EGD

## 2014-03-08 ENCOUNTER — Telehealth: Payer: Self-pay | Admitting: Family Medicine

## 2014-03-08 MED ORDER — CEPHALEXIN 500 MG PO CAPS: 500.0000 mg | ORAL_CAPSULE | Freq: Four times a day (QID) | ORAL | Status: AC

## 2014-03-08 NOTE — Telephone Encounter (Signed)
UA indicates likely UTI with large leukocytes, >20WBCs, and 2+ bacteria.  Urine culture pending, but will initiate treatment with Keflex.  Shirlee LatchAngela Andon Villard, MD, MPH PGY-1,  [facility] 03/08/2014 8:59 AM

## 2014-03-08 NOTE — Telephone Encounter (Signed)
Called home number listed in chart and was informed I had the wrong number. Called mobile number and left message for patient to call back.

## 2014-03-09 LAB — URINE CULTURE
Colony Count: NO GROWTH
ORGANISM ID, BACTERIA: NO GROWTH

## 2014-03-11 ENCOUNTER — Telehealth: Payer: Self-pay | Admitting: *Deleted

## 2014-03-11 NOTE — Telephone Encounter (Signed)
Left another message on mobile number for patient to return call.

## 2014-03-11 NOTE — Telephone Encounter (Signed)
[facility] not carry [facility]afate tabs. Asks if MD would consider changing to something else.

## 2014-03-12 NOTE — Telephone Encounter (Signed)
Left another message for patient. Letter sent to patient address.

## 2014-03-12 NOTE — Telephone Encounter (Signed)
There is not a comparable alternative to carafate.  Patient could try a different pharmacy, or just await GI appt.  Erasmo DownerAngela M Bacigalupo, MD, MPH PGY-1,  [facility] 03/12/2014 8:40 AM

## 2014-03-12 NOTE — Telephone Encounter (Signed)
Multiple attempts have been made to reach patient (see previous phone note). Letter sent to patient address.

## 2014-03-13 ENCOUNTER — Encounter: Payer: Self-pay | Admitting: Internal Medicine

## 2014-03-28 ENCOUNTER — Other Ambulatory Visit: Payer: Self-pay | Admitting: Internal Medicine

## 2014-03-28 DIAGNOSIS — I10 Essential (primary) hypertension: Secondary | ICD-10-CM

## 2014-04-02 ENCOUNTER — Telehealth: Payer: Self-pay | Admitting: Internal Medicine

## 2014-04-02 NOTE — Telephone Encounter (Signed)
Call to patient to confirm appointment for 04/03/14 at 1:15. Lm with someone about appt said would call back to confirm.

## 2014-04-03 ENCOUNTER — Encounter: Payer: Self-pay | Admitting: Internal Medicine

## 2014-04-03 ENCOUNTER — Ambulatory Visit (INDEPENDENT_AMBULATORY_CARE_PROVIDER_SITE_OTHER): Payer: Medicaid Other | Admitting: Internal Medicine

## 2014-04-03 VITALS — BP 149/79 | HR 75 | Temp 98.2°F | Ht 67.0 in | Wt 195.0 lb

## 2014-04-03 DIAGNOSIS — Z1239 Encounter for other screening for malignant neoplasm of breast: Secondary | ICD-10-CM

## 2014-04-03 DIAGNOSIS — I10 Essential (primary) hypertension: Secondary | ICD-10-CM

## 2014-04-03 DIAGNOSIS — N950 Postmenopausal bleeding: Secondary | ICD-10-CM

## 2014-04-03 DIAGNOSIS — E785 Hyperlipidemia, unspecified: Secondary | ICD-10-CM

## 2014-04-03 DIAGNOSIS — E119 Type 2 diabetes mellitus without complications: Secondary | ICD-10-CM

## 2014-04-03 DIAGNOSIS — Z0000 Encounter for general adult medical examination without abnormal findings: Secondary | ICD-10-CM

## 2014-04-03 LAB — BASIC METABOLIC PANEL WITH GFR
BUN: 16 mg/dL (ref 6–23)
CO2: 31 mEq/L (ref 19–32)
Calcium: 9.5 mg/dL (ref 8.4–10.5)
Chloride: 100 mEq/L (ref 96–112)
Creat: 0.67 mg/dL (ref 0.50–1.10)
GFR, Est African American: >89 mL/min
GFR, Est Non African American: >89 mL/min
GLUCOSE: 98 mg/dL (ref 70–99)
Potassium: 3.8 mEq/L (ref 3.5–5.3)
Sodium: 140 mEq/L (ref 135–145)

## 2014-04-03 LAB — GLUCOSE, CAPILLARY: Glucose-Capillary: 92 mg/dL (ref 70–99)

## 2014-04-03 LAB — POCT GLYCOSYLATED HEMOGLOBIN (HGB A1C): HEMOGLOBIN A1C: 6.9

## 2014-04-03 MED ORDER — ATORVASTATIN CALCIUM 80 MG PO TABS: 80.0000 mg | ORAL_TABLET | Freq: Every day | ORAL | Status: DC

## 2014-04-03 MED ORDER — LOSARTAN POTASSIUM 50 MG PO TABS: 100.0000 mg | ORAL_TABLET | Freq: Every day | ORAL | Status: DC

## 2014-04-03 NOTE — Assessment & Plan Note (Signed)
Last mammo 03/15.  Referral for mammo today and patient agreeable.

## 2014-04-03 NOTE — Assessment & Plan Note (Signed)
Lab Results  Component Value Date   HGBA1C 6.9 04/03/2014   HGBA1C 6.3 10/10/2013   HGBA1C 6.4 07/05/2013     Assessment: Diabetes control:  controlled Progress toward A1C goal:   at goal but has increased from 6.3 to 6.9. Comments: This is likely due to 10 pound weight increase and increase in portion sizes.  Plan: Medications:  continue current medications:  Metformin 1000mg  BID Home glucose monitoring:  None Frequency:   Timing:   Instruction/counseling given: discussed foot care and discussed the need for weight loss Educational resources provided: brochure Other plans: She is willing to work on diet and exercise for better BP and DM control.  Recheck in A1c in 3 months.

## 2014-04-03 NOTE — Progress Notes (Signed)
   Subjective:    Patient ID: Janice Massey, female    DOB: Apr 15, 1955, 59 y.o.   MRN: [id number]  HPI Comments: Janice Massey is a 59 year old woman with a PMH of deafness, HTN, VSD, DM type 2 (A1c 6.06 Oct 2013), HLD and GERD here for routine follow-up. Please see problem based A&P for update of chronic conditions.      Review of Systems  Constitutional: Negative for fever, chills, appetite change and unexpected weight change.  Eyes: Negative for visual disturbance.  Respiratory: Negative for cough, shortness of breath and wheezing.   Cardiovascular: Negative for chest pain, palpitations and leg swelling.  Gastrointestinal: Negative for nausea, vomiting, abdominal pain, diarrhea, constipation and blood in stool.  Genitourinary: Negative for dysuria, hematuria and vaginal bleeding.  Neurological: Negative for seizures, weakness and light-headedness.  Psychiatric/Behavioral: Negative for dysphoric mood.       Filed Vitals:   04/03/14 1332  BP: 156/85  Pulse: 78  Temp: 98.2 F (36.8 C)  TempSrc: Oral  Height: 5\' 7"  (1.702 m)  Weight: 195 lb (88.451 kg)  SpO2: 99%   Objective:   Physical Exam  Constitutional: She is oriented to person, place, and time. She appears well-developed. No distress.  HENT:  Head: Normocephalic and atraumatic.  Mouth/Throat: Oropharynx is clear and moist. No oropharyngeal exudate.  Eyes: EOM are normal. Pupils are equal, round, and reactive to light.  Neck: Neck supple.  Cardiovascular: Normal rate and regular rhythm.  Exam reveals no gallop and no friction rub.   Murmur heard. Pulmonary/Chest: Effort normal and breath sounds normal. No respiratory distress. She has no wheezes. She has no rales.  Abdominal: Soft. Bowel sounds are normal. She exhibits no distension and no mass. There is no tenderness. There is no rebound and no guarding.  Musculoskeletal: Normal range of motion. She exhibits no edema or tenderness.  Neurological: She is alert and  oriented to person, place, and time. No cranial nerve deficit.  Foot exam complete - normal toe proprioception and sensation, + achilles reflex  Skin: Skin is warm. She is not diaphoretic.  Psychiatric: She has a normal mood and affect. Her behavior is normal.  Vitals reviewed.         Assessment & Plan:  Please see problem based assessment and plan.

## 2014-04-03 NOTE — Patient Instructions (Signed)
1. Your blood pressure was elevated today.  I have increased your losartan from 50mg  daily to 100mg  daily.  I sent a new prescription to your pharmacy with the new dose OR you can take two of the 50mg  tablets daily.  You have gained a little weight which can contribute to elevated blood pressure.  Please work on exercise (like walking) for 30 minutes on most days of the week.  Also, work on decreasing portion size.  Hopefully, with a little weight loss and diet changes we can reduce the blood pressure medication down in the future.  Please come back to see me in 1 month to check your blood pressure.   2. Please take all medications as prescribed.    3. If you have worsening of your symptoms or new symptoms arise, please call the clinic ([phone]), or go to the ER immediately if symptoms are severe.  I am referring you to the foot doctor and for a mammogram.  Someone will call you to schedule these visits.   DASH Eating Plan DASH stands for "Dietary Approaches to Stop Hypertension." The DASH eating plan is a healthy eating plan that has been shown to reduce high blood pressure (hypertension). Additional health benefits may include reducing the risk of type 2 diabetes mellitus, heart disease, and stroke. The DASH eating plan may also help with weight loss. WHAT DO I NEED TO KNOW ABOUT THE DASH EATING PLAN? For the DASH eating plan, you will follow these general guidelines:  Choose foods with a percent daily value for sodium of less than 5% (as listed on the food label).  Use salt-free seasonings or herbs instead of table salt or sea salt.  Check with your health care provider or pharmacist before using salt substitutes.  Eat lower-sodium products, often labeled as "lower sodium" or "no salt added."  Eat fresh foods.  Eat more vegetables, fruits, and low-fat dairy products.  Choose whole grains. Look for the word "whole" as the first word in the ingredient list.  Choose fish and skinless  chicken or [place] more often than red meat. Limit fish, poultry, and meat to 6 oz (170 g) each day.  Limit sweets, desserts, sugars, and sugary drinks.  Choose heart-healthy fats.  Limit cheese to 1 oz (28 g) per day.  Eat more home-cooked food and less restaurant, buffet, and fast food.  Limit fried foods.  Cook foods using methods other than frying.  Limit canned vegetables. If you do use them, rinse them well to decrease the sodium.  When eating at a restaurant, ask that your food be prepared with less salt, or no salt if possible. WHAT FOODS CAN I EAT? Seek help from a dietitian for individual calorie needs. Grains Whole grain or whole wheat bread. Brown rice. Whole grain or whole wheat pasta. Quinoa, bulgur, and whole grain cereals. Low-sodium cereals. Corn or whole wheat flour tortillas. Whole grain cornbread. Whole grain crackers. Low-sodium crackers. Vegetables Fresh or frozen vegetables (raw, steamed, roasted, or grilled). Low-sodium or reduced-sodium tomato and vegetable juices. Low-sodium or reduced-sodium tomato sauce and paste. Low-sodium or reduced-sodium canned vegetables.  Fruits All fresh, canned (in natural juice), or frozen fruits. Meat and Other Protein Products Ground beef (85% or leaner), grass-fed beef, or beef trimmed of fat. Skinless chicken or [place]. Ground chicken or [place]. Pork trimmed of fat. All fish and seafood. Eggs. Dried beans, peas, or lentils. Unsalted nuts and seeds. Unsalted canned beans. Dairy Low-fat dairy products, such as skim or 1%  milk, 2% or reduced-fat cheeses, low-fat ricotta or cottage cheese, or plain low-fat yogurt. Low-sodium or reduced-sodium cheeses. Fats and Oils Tub margarines without trans fats. Light or reduced-fat mayonnaise and salad dressings (reduced sodium). Avocado. Safflower, olive, or canola oils. Natural peanut or almond butter. Other Unsalted popcorn and pretzels. The items listed above may not be a complete list of  recommended foods or beverages. Contact your dietitian for more options. WHAT FOODS ARE NOT RECOMMENDED? Grains White bread. White pasta. White rice. Refined cornbread. Bagels and croissants. Crackers that contain trans fat. Vegetables Creamed or fried vegetables. Vegetables in a cheese sauce. Regular canned vegetables. Regular canned tomato sauce and paste. Regular tomato and vegetable juices. Fruits Dried fruits. Canned fruit in light or heavy syrup. Fruit juice. Meat and Other Protein Products Fatty cuts of meat. Ribs, chicken wings, bacon, sausage, bologna, salami, chitterlings, fatback, hot dogs, bratwurst, and packaged luncheon meats. Salted nuts and seeds. Canned beans with salt. Dairy Whole or 2% milk, cream, half-and-half, and cream cheese. Whole-fat or sweetened yogurt. Full-fat cheeses or blue cheese. Nondairy creamers and whipped toppings. Processed cheese, cheese spreads, or cheese curds. Condiments Onion and garlic salt, seasoned salt, table salt, and sea salt. Canned and packaged gravies. Worcestershire sauce. Tartar sauce. Barbecue sauce. Teriyaki sauce. Soy sauce, including reduced sodium. Steak sauce. Fish sauce. Oyster sauce. Cocktail sauce. Horseradish. Ketchup and mustard. Meat flavorings and tenderizers. Bouillon cubes. Hot sauce. Tabasco sauce. Marinades. Taco seasonings. Relishes. Fats and Oils Butter, stick margarine, lard, shortening, ghee, and bacon fat. Coconut, palm kernel, or palm oils. Regular salad dressings. Other Pickles and olives. Salted popcorn and pretzels. The items listed above may not be a complete list of foods and beverages to avoid. Contact your dietitian for more information. WHERE CAN I FIND MORE INFORMATION? National Heart, Lung, and Blood Institute: travelstabloid.com Document Released: 12/10/2010 Document Revised: 05/07/2013 Document Reviewed: 10/25/2012 [facility] Patient Information 2015 [place], [place]. This  information is not intended to replace advice given to you by your health care provider. Make sure you discuss any questions you have with your health care provider.

## 2014-04-03 NOTE — Assessment & Plan Note (Signed)
TG elevated in Oct.  She continues to take Lipitor and is tolerating this medication.  She had questioned me about fish oil at our last visit but is not currently taking a supplement or eating fish (she mentioned not liking the taste).  She says she plans to incorporate more fish in her diet.  - continue Lipitor - counseled on diet and exercise

## 2014-04-03 NOTE — Assessment & Plan Note (Signed)
Endometrial bx negative.  The patient has had no further vaginal bleeding since she last saw me.  She was advised to contact Dr. Elly Modena [facility]) or clinic if she develops this again.

## 2014-04-03 NOTE — Assessment & Plan Note (Addendum)
BP Readings from Last 3 Encounters:  04/03/14 149/79  11/05/13 132/75  10/10/13 138/78    Lab Results  Component Value Date   NA 141 08/02/2013   K 3.8 08/02/2013   CREATININE 0.73 08/02/2013    Assessment: Blood pressure control:  elevated Progress toward BP goal:    Comments: BP is normally well controlled.  Her 10 pound weight gain may be contributing.  She says she has been eating bigger portions lately.  She drinks some diet soda but mostly water.  She walks outside for exercise.    Plan: Medications:  continue current medications:  INCREASE losartan from 50mg  daily to 100mg  daily.  Continue HCTZ 25mg  daily. Educational resources provided: brochure Other plans: We discussed increasing physical activity/walking to 30 minutes 5 x weekly; discussed decreasing portions and limiting carbs/starches.  The patient was provided with DASH diet handout.  I explained that weight reduction can have big impact on BP and patient agreeable to these lifestyle changes.  RTC in 2 weeks for BP and BMP check on increased losartan.

## 2014-04-04 ENCOUNTER — Telehealth: Payer: Self-pay | Admitting: Gastroenterology

## 2014-04-04 ENCOUNTER — Encounter: Payer: Self-pay | Admitting: Gastroenterology

## 2014-04-04 ENCOUNTER — Ambulatory Visit: Payer: Self-pay | Admitting: Gastroenterology

## 2014-04-04 LAB — MICROALBUMIN / CREATININE URINE RATIO
CREATININE, URINE: 21.9 mg/dL
MICROALB UR: 1.5 mg/dL (ref <2.0)
Microalb Creat Ratio: 68.5 mg/g — ABNORMAL HIGH (ref 0.0–30.0)

## 2014-04-04 NOTE — Progress Notes (Signed)
Case discussed with Dr. Wilson soon after the resident saw the patient. We reviewed the resident's history and exam and pertinent patient test results. I agree with the assessment, diagnosis, and plan of care documented in the resident's note. 

## 2014-04-04 NOTE — Telephone Encounter (Signed)
PATIENT WAS A NO SHOW 04/04/14 AND LETTER WAS SENT °

## 2014-04-10 ENCOUNTER — Telehealth: Payer: Self-pay | Admitting: *Deleted

## 2014-04-10 NOTE — Telephone Encounter (Signed)
Pt call through an interpreter. Pt is c/o cough for past 2 - 3 days. She did some yard work yesterday that seems to make her cough worse.  She is not able to sleep at night because of the cough. She feels she may have a slight fever but does not have a thermometer to check.  She does not feel like eating. She is taking OTC tylenol, this is not new for her. She is not aware of any seasonal allergies. She is requesting cough medication. Pt # [id number]

## 2014-04-10 NOTE — Telephone Encounter (Signed)
Pt has been informed and will try this med

## 2014-04-10 NOTE — Telephone Encounter (Signed)
She can try OTC Robitussin or similar anti-tussive.  If she is not better or worsening in the next few days please ask her to come in for evaluation.

## 2014-04-11 ENCOUNTER — Telehealth: Payer: Self-pay | Admitting: *Deleted

## 2014-04-11 NOTE — Telephone Encounter (Signed)
Received Pa request from pt's pharmacy for her Losartan.  Dose was increase from 50 mg to 100 mg during last OV.  Medicaid will only pay for once daily dosing and pt must try and fail an ACE (which she developed a cough on).  PA request submitted online via [place] [employer]. Request sent for review.Janice Massey, Janice Cassady4/7/20163:22 PM    Confirmation #:[id number] Janice Massey

## 2014-04-12 ENCOUNTER — Emergency Department (INDEPENDENT_AMBULATORY_CARE_PROVIDER_SITE_OTHER): Payer: Medicaid Other

## 2014-04-12 ENCOUNTER — Encounter (HOSPITAL_COMMUNITY): Payer: Self-pay | Admitting: *Deleted

## 2014-04-12 ENCOUNTER — Emergency Department (INDEPENDENT_AMBULATORY_CARE_PROVIDER_SITE_OTHER)
Admission: EM | Admit: 2014-04-12 | Discharge: 2014-04-12 | Disposition: A | Discharge status: Home / Self Care | Payer: Medicaid Other | Source: Home / Self Care | Attending: Family Medicine | Admitting: Family Medicine

## 2014-04-12 DIAGNOSIS — R110 Nausea: Secondary | ICD-10-CM

## 2014-04-12 DIAGNOSIS — J069 Acute upper respiratory infection, unspecified: Secondary | ICD-10-CM

## 2014-04-12 DIAGNOSIS — K5900 Constipation, unspecified: Secondary | ICD-10-CM

## 2014-04-12 LAB — POCT URINALYSIS DIP (DEVICE)
Bilirubin Urine: NEGATIVE
Glucose, UA: NEGATIVE mg/dL
KETONES UR: NEGATIVE mg/dL
Leukocytes, UA: NEGATIVE
Nitrite: NEGATIVE
PROTEIN: NEGATIVE mg/dL
SPECIFIC GRAVITY, URINE: 1.01 (ref 1.005–1.030)
Urobilinogen, UA: 0.2 mg/dL (ref 0.0–1.0)
pH: 5.5 (ref 5.0–8.0)

## 2014-04-12 MED ORDER — ONDANSETRON 4 MG PO TBDP
ORAL_TABLET | ORAL | Status: AC
  Filled 2014-04-12: qty 1

## 2014-04-12 MED ORDER — ONDANSETRON 4 MG PO TBDP
4.0000 mg | ORAL_TABLET | Freq: Once | ORAL | Status: AC
  Administered 2014-04-12: 4 mg via ORAL

## 2014-04-12 MED ORDER — BENZONATATE 100 MG PO CAPS: 100.0000 mg | ORAL_CAPSULE | Freq: Three times a day (TID) | ORAL | Status: DC | PRN

## 2014-04-12 MED ORDER — ONDANSETRON HCL 4 MG PO TABS: 4.0000 mg | ORAL_TABLET | Freq: Three times a day (TID) | ORAL | Status: DC | PRN

## 2014-04-12 MED ORDER — POLYETHYLENE GLYCOL 3350 17 G PO PACK: 17.0000 g | PACK | Freq: Every day | ORAL | Status: DC

## 2014-04-12 NOTE — ED Notes (Signed)
Pt is here with cold like symptoms per registration sheet. Sign language interpretor called.

## 2014-04-12 NOTE — ED Provider Notes (Signed)
CSN: [id number]     Arrival date & time 04/12/14  1049 History   First MD Initiated Contact with Patient 04/12/14 1215     Chief Complaint  Patient presents with  . URI  . Emesis   (Consider location/radiation/quality/duration/timing/severity/associated sxs/prior Treatment) HPI Comments: Also mentions nausea, constipation, and recent known ill contacts. States she has also been suffering from seasonal environmental allergies Nonsmoker Unemployed PCP: [facility]  Patient is a 59 y.o. female presenting with URI and vomiting. The history is provided by the patient. The history is limited by a language barrier (patient is deaf). A language interpreter was used.  URI Presenting symptoms: congestion, cough, rhinorrhea and sore throat   Presenting symptoms: no ear pain and no fever   Severity:  Mild Onset quality:  Gradual Duration:  4 days Timing:  Constant Chronicity:  New Associated symptoms: myalgias   Associated symptoms: no headaches and no neck pain   Emesis Associated symptoms: chills, myalgias, sore throat and URI   Associated symptoms: no abdominal pain and no headaches     Past Medical History  Diagnosis Date  . Diabetes mellitus   . Hypertension   . Back pain   . Chronic PID   . Ventricular septal defect 2004 per echo  . [place]   . Deaf    Past Surgical History  Procedure Laterality Date  . Cholecystectomy  11/04/1993  . Tubal ligation  10/05/1978  . Colposcopy     Family History  Problem Relation Age of Onset  . Cancer Mother   . Hypertension Father   . Diabetes Father   . Diabetes Paternal Aunt   . Diabetes Maternal Grandfather    History  Substance Use Topics  . Smoking status: Never Smoker   . Smokeless tobacco: Never Used  . Alcohol Use: No   OB History    Gravida Para Term Preterm AB TAB SAB Ectopic Multiple Living   2 2 2       2      Review of Systems  Constitutional: Positive for chills. Negative for fever.  HENT: Positive for congestion,  rhinorrhea and sore throat. Negative for ear pain, mouth sores, postnasal drip and trouble swallowing.   Respiratory: Positive for cough. Negative for chest tightness and shortness of breath.   Cardiovascular: Negative.   Gastrointestinal: Positive for nausea and constipation. Negative for vomiting and abdominal pain.  Genitourinary: Negative.   Musculoskeletal: Positive for myalgias. Negative for back pain and neck pain.  Skin: Negative.   Neurological: Negative for dizziness, syncope, weakness, light-headedness and headaches.    Allergies  Codeine; Lisinopril; and Penicillins  Home Medications   Prior to Admission medications   Medication Sig Start Date End Date Taking? Authorizing Provider  albuterol (PROVENTIL HFA;VENTOLIN HFA) 108 (90 BASE) MCG/ACT inhaler Inhale 1-2 puffs into the lungs every 6 (six) hours as needed for wheezing or shortness of breath. 08/08/13   Olegario Messier, NP  aspirin 81 MG tablet Take 81 mg by mouth every morning.    Historical Provider, MD  atorvastatin (LIPITOR) 80 MG tablet Take 1 tablet (80 mg total) by mouth daily. 04/03/14   Francesca Oman, DO  benzonatate (TESSALON) 100 MG capsule Take 1 capsule (100 mg total) by mouth 3 (three) times daily as needed for cough. 04/12/14   Audelia Hives Maalle Starrett, PA  hydrochlorothiazide (HYDRODIURIL) 25 MG tablet TAKE 1 TABLET BY MOUTH EVERY DAY 12/24/13   Francesca Oman, DO  losartan (COZAAR) 50 MG tablet Take 2  tablets (100 mg total) by mouth daily. 04/03/14   Francesca Oman, DO  metFORMIN (GLUCOPHAGE) 1000 MG tablet TAKE 1 TABLET BY MOUTH TWICE DAILY 12/24/13   Francesca Oman, DO  ondansetron (ZOFRAN) 4 MG tablet Take 1 tablet (4 mg total) by mouth every 8 (eight) hours as needed for nausea or vomiting. 04/12/14   Audelia Hives Moosa Bueche, PA  polyethylene glycol [facility] / GLYCOLAX) packet Take 17 g by mouth daily. Mix into 8 oz of water and drink once daily x 5 days for constipation 04/12/14   Annett Gula H Onalee Steinbach, PA   BP  135/90 mmHg  Pulse 105  Temp(Src) 99 F (37.2 C) (Oral)  Resp 14  SpO2 99% Physical Exam  Constitutional: She is oriented to person, place, and time. She appears well-developed and well-nourished. No distress.  HENT:  Head: Normocephalic and atraumatic.  Right Ear: Hearing, tympanic membrane, external ear and ear canal normal.  Left Ear: Hearing, tympanic membrane, external ear and ear canal normal.  Nose: Nose normal.  Mouth/Throat: Uvula is midline, oropharynx is clear and moist and mucous membranes are normal.  Eyes: Conjunctivae are normal. No scleral icterus.  Neck: Normal range of motion. Neck supple.  Cardiovascular: Normal rate, regular rhythm and normal heart sounds.   Pulmonary/Chest: Effort normal and breath sounds normal. No respiratory distress. She has no wheezes.  Abdominal: Soft. Bowel sounds are normal. She exhibits no distension. There is no tenderness.  Musculoskeletal: Normal range of motion.  Lymphadenopathy:    She has no cervical adenopathy.  Neurological: She is alert and oriented to person, place, and time.  Skin: Skin is warm and dry.  Psychiatric: She has a normal mood and affect. Her behavior is normal.  Nursing note and vitals reviewed.   ED Course  Procedures (including critical care time) Labs Review Labs Reviewed  POCT URINALYSIS DIP (DEVICE) - Abnormal; Notable for the following:    Hgb urine dipstick TRACE (*)    All other components within normal limits  URINE CULTURE    Imaging Review Dg Chest 2 View  04/12/2014   CLINICAL DATA:  Low-grade fever with nausea vomiting and cough.  EXAM: CHEST  2 VIEW  COMPARISON:  02/25/2011  FINDINGS: Lungs are clear. Cardiomediastinal silhouette is within normal. There are degenerative changes of the spine.  IMPRESSION: No active cardiopulmonary disease.   Electronically Signed   By: Marin Olp M.D.   On: 04/12/2014 12:56     MDM   1. URI (upper respiratory infection)   2. Nausea   3. Constipation,  unspecified constipation type   UA unremarkable CXR negative for CAP or bronchitis. Given 4 mg ODT zofran at [facility] with noted improvement of nausea.  Patient advised regarding symptomatic care at home with fluids, rest, tylenol or ibuprofen. Miralax as prescribed for constipation Zofran for nausea Advised to follow up with PCP if symptoms do not improve over the next 3-4 days.      Lutricia Feil, [place] 04/12/14 1344

## 2014-04-12 NOTE — Discharge Instructions (Signed)
Your chest xray was normal and your urine studies were also normal. Your physical exam and vital signs were also reassuring. Please use medications as prescribed and if symptoms do not improve over the next 3-4 days, please follow up with your doctor. If symptoms become suddenly worse or severe, please seek re-evaluation in your nearest emergency room  Constipation Constipation is when a person has fewer than three bowel movements a week, has difficulty having a bowel movement, or has stools that are dry, hard, or larger than normal. As people grow older, constipation is more common. If you try to fix constipation with medicines that make you have a bowel movement (laxatives), the problem may get worse. Long-term laxative use may cause the muscles of the colon to become weak. A low-fiber diet, not taking in enough fluids, and taking certain medicines may make constipation worse.  CAUSES   Certain medicines, such as antidepressants, pain medicine, iron supplements, antacids, and water pills.   Certain diseases, such as diabetes, irritable bowel syndrome (IBS), thyroid disease, or depression.   Not drinking enough water.   Not eating enough fiber-rich foods.   Stress or travel.   Lack of physical activity or exercise.   Ignoring the urge to have a bowel movement.   Using laxatives too much.  SIGNS AND SYMPTOMS   Having fewer than three bowel movements a week.   Straining to have a bowel movement.   Having stools that are hard, dry, or larger than normal.   Feeling full or bloated.   Pain in the lower abdomen.   Not feeling relief after having a bowel movement.  DIAGNOSIS  Your health care provider will take a medical history and perform a physical exam. Further testing may be done for severe constipation. Some tests may include:  A barium enema X-ray to examine your rectum, colon, and, sometimes, your small intestine.   A sigmoidoscopy to examine your lower colon.    A colonoscopy to examine your entire colon. TREATMENT  Treatment will depend on the severity of your constipation and what is causing it. Some dietary treatments include drinking more fluids and eating more fiber-rich foods. Lifestyle treatments may include regular exercise. If these diet and lifestyle recommendations do not help, your health care provider may recommend taking over-the-counter laxative medicines to help you have bowel movements. Prescription medicines may be prescribed if over-the-counter medicines do not work.  HOME CARE INSTRUCTIONS   Eat foods that have a lot of fiber, such as fruits, vegetables, whole grains, and beans.  Limit foods high in fat and processed sugars, such as french fries, hamburgers, cookies, candies, and soda.   A fiber supplement may be added to your diet if you cannot get enough fiber from foods.   Drink enough fluids to keep your urine clear or pale yellow.   Exercise regularly or as directed by your health care provider.   Go to the restroom when you have the urge to go. Do not hold it.   Only take over-the-counter or prescription medicines as directed by your health care provider. Do not take other medicines for constipation without talking to your health care provider first.  [place] IF:   You have bright red blood in your stool.   Your constipation lasts for more than 4 days or gets worse.   You have abdominal or rectal pain.   You have thin, pencil-like stools.   You have unexplained weight loss. MAKE SURE YOU:   Understand  these instructions.  Will watch your condition.  Will get help right away if you are not doing well or get worse. Document Released: 09/19/2003 Document Revised: 12/26/2012 Document Reviewed: 10/02/2012 [facility] Patient Information 2015 [place], [place]. This information is not intended to replace advice given to you by your health care provider. Make sure you discuss any questions  you have with your health care provider.  Nausea, Adult Nausea is the feeling that you have an upset stomach or have to vomit. Nausea by itself is not likely a serious concern, but it may be an early sign of more serious medical problems. As nausea gets worse, it can lead to vomiting. If vomiting develops, there is the risk of dehydration.  CAUSES   Viral infections.  Food poisoning.  Medicines.  Pregnancy.  Motion sickness.  Migraine headaches.  Emotional distress.  Severe pain from any source.  Alcohol intoxication. HOME CARE INSTRUCTIONS  Get plenty of rest.  Ask your caregiver about specific rehydration instructions.  Eat small amounts of food and sip liquids more often.  Take all medicines as told by your caregiver. SEEK MEDICAL CARE IF:  You have not improved after 2 days, or you get worse.  You have a headache. SEEK IMMEDIATE MEDICAL CARE IF:   You have a fever.  You faint.  You keep vomiting or have blood in your vomit.  You are extremely weak or dehydrated.  You have dark or bloody stools.  You have severe chest or abdominal pain. MAKE SURE YOU:  Understand these instructions.  Will watch your condition.  Will get help right away if you are not doing well or get worse. Document Released: 01/29/2004 Document Revised: 09/15/2011 Document Reviewed: 09/02/2010 [facility] Patient Information 2015 [place], [place]. This information is not intended to replace advice given to you by your health care provider. Make sure you discuss any questions you have with your health care provider.  Upper Respiratory Infection, Adult An upper respiratory infection (URI) is also sometimes known as the common cold. The upper respiratory tract includes the nose, sinuses, throat, trachea, and bronchi. Bronchi are the airways leading to the lungs. Most people improve within 1 week, but symptoms can last up to 2 weeks. A residual cough may last even longer.  CAUSES Many  different viruses can infect the tissues lining the upper respiratory tract. The tissues become irritated and inflamed and often become very moist. Mucus production is also common. A cold is contagious. You can easily spread the virus to others by oral contact. This includes kissing, sharing a glass, coughing, or sneezing. Touching your mouth or nose and then touching a surface, which is then touched by another person, can also spread the virus. SYMPTOMS  Symptoms typically develop 1 to 3 days after you come in contact with a cold virus. Symptoms vary from person to person. They may include:  Runny nose.  Sneezing.  Nasal congestion.  Sinus irritation.  Sore throat.  Loss of voice (laryngitis).  Cough.  Fatigue.  Muscle aches.  Loss of appetite.  Headache.  Low-grade fever. DIAGNOSIS  You might diagnose your own cold based on familiar symptoms, since most people get a cold 2 to 3 times a year. Your caregiver can confirm this based on your exam. Most importantly, your caregiver can check that your symptoms are not due to another disease such as strep throat, sinusitis, pneumonia, asthma, or epiglottitis. Blood tests, throat tests, and X-rays are not necessary to diagnose a common cold, but they may  sometimes be helpful in excluding other more serious diseases. Your caregiver will decide if any further tests are required. RISKS AND COMPLICATIONS  You may be at risk for a more severe case of the common cold if you smoke cigarettes, have chronic heart disease (such as heart failure) or lung disease (such as asthma), or if you have a weakened immune system. The very young and very old are also at risk for more serious infections. Bacterial sinusitis, middle ear infections, and bacterial pneumonia can complicate the common cold. The common cold can worsen asthma and chronic obstructive pulmonary disease (COPD). Sometimes, these complications can require emergency medical care and may be  life-threatening. PREVENTION  The best way to protect against getting a cold is to practice good hygiene. Avoid oral or hand contact with people with cold symptoms. Wash your hands often if contact occurs. There is no clear evidence that vitamin C, vitamin E, echinacea, or exercise reduces the chance of developing a cold. However, it is always recommended to get plenty of rest and practice good nutrition. TREATMENT  Treatment is directed at relieving symptoms. There is no cure. Antibiotics are not effective, because the infection is caused by a virus, not by bacteria. Treatment may include:  Increased fluid intake. Sports drinks offer valuable electrolytes, sugars, and fluids.  Breathing heated mist or steam (vaporizer or shower).  Eating chicken soup or other clear broths, and maintaining good nutrition.  Getting plenty of rest.  Using gargles or lozenges for comfort.  Controlling fevers with ibuprofen or acetaminophen as directed by your caregiver.  Increasing usage of your inhaler if you have asthma. Zinc gel and zinc lozenges, taken in the first 24 hours of the common cold, can shorten the duration and lessen the severity of symptoms. Pain medicines may help with fever, muscle aches, and throat pain. A variety of non-prescription medicines are available to treat congestion and runny nose. Your caregiver can make recommendations and may suggest nasal or lung inhalers for other symptoms.  HOME CARE INSTRUCTIONS   Only take over-the-counter or prescription medicines for pain, discomfort, or fever as directed by your caregiver.  Use a warm mist humidifier or inhale steam from a shower to increase air moisture. This may keep secretions moist and make it easier to breathe.  Drink enough water and fluids to keep your urine clear or pale yellow.  Rest as needed.  Return to work when your temperature has returned to normal or as your caregiver advises. You may need to stay home longer to  avoid infecting others. You can also use a face mask and careful hand washing to prevent spread of the virus. SEEK MEDICAL CARE IF:   After the first few days, you feel you are getting worse rather than better.  You need your caregiver's advice about medicines to control symptoms.  You develop chills, worsening shortness of breath, or brown or red sputum. These may be signs of pneumonia.  You develop yellow or brown nasal discharge or pain in the face, especially when you bend forward. These may be signs of sinusitis.  You develop a fever, swollen neck glands, pain with swallowing, or white areas in the back of your throat. These may be signs of strep throat. SEEK IMMEDIATE MEDICAL CARE IF:   You have a fever.  You develop severe or persistent headache, ear pain, sinus pain, or chest pain.  You develop wheezing, a prolonged cough, cough up blood, or have a change in your usual mucus (if  you have chronic lung disease).  You develop sore muscles or a stiff neck. Document Released: 06/16/2000 Document Revised: 03/15/2011 Document Reviewed: 03/28/2013 [facility] Patient Information 2015 [place], [place]. This information is not intended to replace advice given to you by your health care provider. Make sure you discuss any questions you have with your health care provider.

## 2014-04-13 LAB — URINE CULTURE: Special Requests: NORMAL

## 2014-04-14 ENCOUNTER — Emergency Department (HOSPITAL_COMMUNITY)
Admission: EM | Admit: 2014-04-14 | Discharge: 2014-04-14 | Disposition: A | Discharge status: Home / Self Care | Payer: Medicaid Other | Attending: Emergency Medicine | Admitting: Emergency Medicine

## 2014-04-14 ENCOUNTER — Encounter (HOSPITAL_COMMUNITY): Payer: Self-pay | Admitting: *Deleted

## 2014-04-14 DIAGNOSIS — I10 Essential (primary) hypertension: Secondary | ICD-10-CM | POA: Diagnosis not present

## 2014-04-14 DIAGNOSIS — Q210 Ventricular septal defect: Secondary | ICD-10-CM | POA: Insufficient documentation

## 2014-04-14 DIAGNOSIS — Z880 Allergy status to penicillin: Secondary | ICD-10-CM | POA: Diagnosis not present

## 2014-04-14 DIAGNOSIS — Z79899 Other long term (current) drug therapy: Secondary | ICD-10-CM | POA: Insufficient documentation

## 2014-04-14 DIAGNOSIS — R1013 Epigastric pain: Secondary | ICD-10-CM | POA: Diagnosis present

## 2014-04-14 DIAGNOSIS — H9190 Unspecified hearing loss, unspecified ear: Secondary | ICD-10-CM | POA: Diagnosis not present

## 2014-04-14 DIAGNOSIS — R110 Nausea: Secondary | ICD-10-CM

## 2014-04-14 DIAGNOSIS — Z7982 Long term (current) use of aspirin: Secondary | ICD-10-CM | POA: Insufficient documentation

## 2014-04-14 DIAGNOSIS — R112 Nausea with vomiting, unspecified: Secondary | ICD-10-CM | POA: Insufficient documentation

## 2014-04-14 DIAGNOSIS — E119 Type 2 diabetes mellitus without complications: Secondary | ICD-10-CM | POA: Diagnosis not present

## 2014-04-14 LAB — CBC WITH DIFFERENTIAL/PLATELET
BASOS PCT: 0 % (ref 0–1)
Basophils Absolute: 0 10*3/uL (ref 0.0–0.1)
EOS PCT: 2 % (ref 0–5)
Eosinophils Absolute: 0.1 10*3/uL (ref 0.0–0.7)
HCT: 38.8 % (ref 36.0–46.0)
HEMOGLOBIN: 13.1 g/dL (ref 12.0–15.0)
LYMPHS ABS: 2.4 10*3/uL (ref 0.7–4.0)
Lymphocytes Relative: 44 % (ref 12–46)
MCH: 29.4 pg (ref 26.0–34.0)
MCHC: 33.8 g/dL (ref 30.0–36.0)
MCV: 87.2 fL (ref 78.0–100.0)
Monocytes Absolute: 0.5 10*3/uL (ref 0.1–1.0)
Monocytes Relative: 9 % (ref 3–12)
NEUTROS ABS: 2.5 10*3/uL (ref 1.7–7.7)
Neutrophils Relative %: 45 % (ref 43–77)
Platelets: 237 10*3/uL (ref 150–400)
RBC: 4.45 MIL/uL (ref 3.87–5.11)
RDW: 12.9 % (ref 11.5–15.5)
WBC: 5.5 10*3/uL (ref 4.0–10.5)

## 2014-04-14 LAB — URINE MICROSCOPIC-ADD ON

## 2014-04-14 LAB — COMPREHENSIVE METABOLIC PANEL
ALT: 41 U/L — ABNORMAL HIGH (ref 0–35)
AST: 41 U/L — AB (ref 0–37)
Albumin: 4 g/dL (ref 3.5–5.2)
Alkaline Phosphatase: 62 U/L (ref 39–117)
Anion gap: 11 (ref 5–15)
BILIRUBIN TOTAL: 1.4 mg/dL — AB (ref 0.3–1.2)
BUN: 12 mg/dL (ref 6–23)
CHLORIDE: 96 mmol/L (ref 96–112)
CO2: 29 mmol/L (ref 19–32)
CREATININE: 0.94 mg/dL (ref 0.50–1.10)
Calcium: 8.9 mg/dL (ref 8.4–10.5)
GFR calc non Af Amer: 66 mL/min — ABNORMAL LOW (ref >90)
GFR, EST AFRICAN AMERICAN: 76 mL/min — AB (ref >90)
GLUCOSE: 131 mg/dL — AB (ref 70–99)
Potassium: 3.3 mmol/L — ABNORMAL LOW (ref 3.5–5.1)
Sodium: 136 mmol/L (ref 135–145)
Total Protein: 7.3 g/dL (ref 6.0–8.3)

## 2014-04-14 LAB — URINALYSIS, ROUTINE W REFLEX MICROSCOPIC
BILIRUBIN URINE: NEGATIVE
Glucose, UA: NEGATIVE mg/dL
Hgb urine dipstick: NEGATIVE
Ketones, ur: NEGATIVE mg/dL
Nitrite: NEGATIVE
PH: 5 (ref 5.0–8.0)
Protein, ur: NEGATIVE mg/dL
Specific Gravity, Urine: 1.012 (ref 1.005–1.030)
Urobilinogen, UA: 0.2 mg/dL (ref 0.0–1.0)

## 2014-04-14 LAB — LIPASE, BLOOD: LIPASE: 27 U/L (ref 11–59)

## 2014-04-14 MED ORDER — SUCRALFATE 1 GM/10ML PO SUSP: 1.0000 g | Freq: Four times a day (QID) | ORAL | Status: DC

## 2014-04-14 MED ORDER — OMEPRAZOLE 20 MG PO CPDR: 20.0000 mg | DELAYED_RELEASE_CAPSULE | Freq: Every day | ORAL | Status: DC

## 2014-04-14 MED ORDER — GI COCKTAIL ~~LOC~~
30.0000 mL | Freq: Once | ORAL | Status: AC
  Administered 2014-04-14: 30 mL via ORAL
  Filled 2014-04-14: qty 30

## 2014-04-14 NOTE — ED Provider Notes (Signed)
CSN: [id number]     Arrival date & time 04/14/14  1314 History   First MD Initiated Contact with Patient 04/14/14 1457     Chief Complaint  Patient presents with  . Abdominal Pain  . Emesis    HPI  Patient presents with ongoing epigastric pain, nausea, cough. Patient's illness began about 5 days ago, and during the illness she has seen urgent care practitioners. Currently the patient is taking polyethylene glycol, Zofran, Tessalon. She states that in spite of taking all his medication she continues to have epigastric discomfort, nausea, cough. Patient is deaf, can indicates with sign language. History of present illness is obtained with the assistance of a sign language interpreter. Patient states that she was well prior to the onset of symptoms.   Past Medical History  Diagnosis Date  . Diabetes mellitus   . Hypertension   . Back pain   . Chronic PID   . Ventricular septal defect 2004 per echo  . [place]   . Deaf    Past Surgical History  Procedure Laterality Date  . Cholecystectomy  11/04/1993  . Tubal ligation  10/05/1978  . Colposcopy     Family History  Problem Relation Age of Onset  . Cancer Mother   . Hypertension Father   . Diabetes Father   . Diabetes Paternal Aunt   . Diabetes Maternal Grandfather    History  Substance Use Topics  . Smoking status: Never Smoker   . Smokeless tobacco: Never Used  . Alcohol Use: No   OB History    Gravida Para Term Preterm AB TAB SAB Ectopic Multiple Living   2 2 2       2      Review of Systems  Constitutional:       Per HPI, otherwise negative  HENT:       Per HPI, otherwise negative  Respiratory:       Per HPI, otherwise negative  Cardiovascular:       Per HPI, otherwise negative  Gastrointestinal: Positive for nausea. Negative for vomiting.  Endocrine:       Negative aside from HPI  Genitourinary:       Neg aside from HPI   Musculoskeletal:       Per HPI, otherwise negative  Skin: Negative.   Neurological:  Negative for syncope.      Allergies  Codeine; Lisinopril; and Penicillins  Home Medications   Prior to Admission medications   Medication Sig Start Date End Date Taking? Authorizing Provider  albuterol (PROVENTIL HFA;VENTOLIN HFA) 108 (90 BASE) MCG/ACT inhaler Inhale 1-2 puffs into the lungs every 6 (six) hours as needed for wheezing or shortness of breath. 08/08/13   Olegario Messier, NP  aspirin 81 MG tablet Take 81 mg by mouth every morning.    Historical Provider, MD  atorvastatin (LIPITOR) 80 MG tablet Take 1 tablet (80 mg total) by mouth daily. 04/03/14   Francesca Oman, DO  benzonatate (TESSALON) 100 MG capsule Take 1 capsule (100 mg total) by mouth 3 (three) times daily as needed for cough. 04/12/14   Audelia Hives Presson, PA  hydrochlorothiazide (HYDRODIURIL) 25 MG tablet TAKE 1 TABLET BY MOUTH EVERY DAY 12/24/13   Francesca Oman, DO  losartan (COZAAR) 50 MG tablet Take 2 tablets (100 mg total) by mouth daily. 04/03/14   Francesca Oman, DO  metFORMIN (GLUCOPHAGE) 1000 MG tablet TAKE 1 TABLET BY MOUTH TWICE DAILY 12/24/13   Francesca Oman, DO  ondansetron (ZOFRAN) 4 MG tablet Take 1 tablet (4 mg total) by mouth every 8 (eight) hours as needed for nausea or vomiting. 04/12/14   Audelia Hives Presson, PA  polyethylene glycol [facility] / GLYCOLAX) packet Take 17 g by mouth daily. Mix into 8 oz of water and drink once daily x 5 days for constipation 04/12/14   Annett Gula H Presson, PA   BP 121/83 mmHg  Pulse 93  Temp(Src) 98.6 F (37 C)  Resp 16  Ht 5' 5.5" (1.664 m)  Wt 185 lb (83.915 kg)  BMI 30.31 kg/m2  SpO2 97% Physical Exam  Constitutional: She is oriented to person, place, and time. She appears well-developed and well-nourished. No distress.  HENT:  Head: Normocephalic and atraumatic.  Eyes: Conjunctivae and EOM are normal.  Cardiovascular: Normal rate and regular rhythm.   Pulmonary/Chest: Effort normal and breath sounds normal. No stridor. No respiratory distress.    Abdominal: She exhibits no distension. There is tenderness in the epigastric area.    Musculoskeletal: She exhibits no edema.  Neurological: She is alert and oriented to person, place, and time. A cranial nerve deficit is present.  Patient is deaf, no facial asymmetry, speech is rough, but intelligible  Skin: Skin is warm and dry.  Psychiatric: She has a normal mood and affect.  Nursing note and vitals reviewed.   ED Course  Procedures (including critical care time) Labs Review Labs Reviewed  COMPREHENSIVE METABOLIC PANEL - Abnormal; Notable for the following:    Potassium 3.3 (*)    Glucose, Bld 131 (*)    AST 41 (*)    ALT 41 (*)    Total Bilirubin 1.4 (*)    GFR calc non Af Amer 66 (*)    GFR calc Af Amer 76 (*)    All other components within normal limits  CBC WITH DIFFERENTIAL/PLATELET  LIPASE, BLOOD  URINALYSIS, ROUTINE W REFLEX MICROSCOPIC    On repeat exam patient is improved.   We discussed the elevation in hepatic function, and the need to f/u w PMD and gastroenterology.    MDM   patient presents with ongoing general illness, including cough, epigastric discomfort, nausea, vomiting, weakness. Here patient is hemodynamically stable, awake, alert, in no distress. She is tenderness to palpation of the epigastrium. Labs suggest no sepsis or bacteremia. No evidence for pneumonia. Patient likely has gastritis or esophagitis. Patient was started on topical therapy, which improved her condition, as well as PPI.     Carmin Muskrat, MD 04/14/14 [phone]

## 2014-04-14 NOTE — ED Notes (Signed)
Pt was seen at [facility] two days ago for cold symptoms. Reports now having abd pain and vomiting.

## 2014-04-14 NOTE — Discharge Instructions (Signed)
As discussed, your nausea, abdominal pain or likely, from inflammation of your stomach and possibly your liver.  It is important to take all medication as directed, and be sure to follow-up with both your primary care physician and our gastroenterologist.  Return here for concerning changes in your condition.

## 2014-04-14 NOTE — ED Notes (Signed)
Info per interpreter.  Pt has had cough, nausea,vomiting x 1 week.  Pt seen at u/c 4-8, dx: URI, emesis, constipation.  Zofran and Tessalon not effective.  Last BM this morning,small and soft.  Pt taking Polyethylene glycol as directed.

## 2014-04-14 NOTE — ED Notes (Signed)
As pt being discharged, pt asked RN to look into her ear, stating she may have a "q-tip" stuck in her ear.  PA aware. Pa at bedside to remove q-tip.  Pt discharged, NAD.  Interpreter used throughout stay in ED.

## 2014-04-18 ENCOUNTER — Encounter: Payer: Self-pay | Admitting: Internal Medicine

## 2014-04-18 ENCOUNTER — Ambulatory Visit (INDEPENDENT_AMBULATORY_CARE_PROVIDER_SITE_OTHER): Payer: Medicaid Other | Admitting: Internal Medicine

## 2014-04-18 VITALS — BP 126/74 | HR 75 | Temp 98.3°F | Ht 67.0 in | Wt 189.6 lb

## 2014-04-18 DIAGNOSIS — K219 Gastro-esophageal reflux disease without esophagitis: Secondary | ICD-10-CM | POA: Diagnosis not present

## 2014-04-18 DIAGNOSIS — R1013 Epigastric pain: Secondary | ICD-10-CM | POA: Diagnosis not present

## 2014-04-18 DIAGNOSIS — I10 Essential (primary) hypertension: Secondary | ICD-10-CM | POA: Diagnosis not present

## 2014-04-18 DIAGNOSIS — E119 Type 2 diabetes mellitus without complications: Secondary | ICD-10-CM

## 2014-04-18 LAB — GLUCOSE, CAPILLARY: Glucose-Capillary: 139 mg/dL — ABNORMAL HIGH (ref 70–99)

## 2014-04-18 MED ORDER — OMEPRAZOLE 20 MG PO CPDR: 20.0000 mg | DELAYED_RELEASE_CAPSULE | Freq: Every day | ORAL | Status: DC

## 2014-04-18 MED ORDER — LOSARTAN POTASSIUM 50 MG PO TABS: 50.0000 mg | ORAL_TABLET | Freq: Every day | ORAL | Status: DC

## 2014-04-18 NOTE — Patient Instructions (Addendum)
1. Please continue to take your omeprazole since it is helping your stomach pain.  Do not take any ibuprofen or naproxen as these medications may make your stomach discomfort worse.   2. Please take all medications as prescribed.    3. If you have worsening of your symptoms or new symptoms arise, please call the clinic [phone]), or go to the ER immediately if symptoms are severe.   Please come back to see me in 2 weeks for follow-up of your stomach pain.  Please call clinic to come in sooner if pain is getting worse.  Please go to ED if you have severe symptoms or cannot keep food or drink down.

## 2014-04-18 NOTE — Progress Notes (Signed)
If not better, referral to GI for upper endoscopy.   Case discussed with Dr. Redmond Pulling soon after the resident saw the patient.  We reviewed the resident's history and exam and pertinent patient test results.  I agree with the assessment, diagnosis, and plan of care documented in the resident's note.

## 2014-04-18 NOTE — Progress Notes (Signed)
   Subjective:    Patient ID: Janice Massey, female    DOB: October 17, 1955, 59 y.o.   MRN: [id number]  HPI Comments: Janice Massey is a 59 year old woman with PMH as below here for follow-up of abdominal pain after being seen in ED and UC.  Please see problem based charting for A&P.  Abdominal Pain This is a new problem. The current episode started in the past 7 days. The problem occurs constantly. The problem has been unchanged. The pain is located in the periumbilical region. The pain is at a severity of 8/10. The quality of the pain is burning and sharp. Associated symptoms include anorexia, a fever, myalgias, nausea and vomiting. Pertinent negatives include no belching, constipation, diarrhea, dysuria, flatus, hematochezia, hematuria or melena. Associated symptoms comments: Vomiting about once daily.  +heartburn, acid in back of throat, cough, sneezing. Nothing aggravates the pain. She has tried proton pump inhibitors for the symptoms. The treatment provided mild relief. Her past medical history is significant for abdominal surgery and gallstones. There is no history of Crohn's disease, GERD, pancreatitis, PUD or ulcerative colitis. GB removed  several years ago for gallstones     Past Medical History  Diagnosis Date  . Diabetes mellitus   . Hypertension   . Back pain   . Chronic PID   . Ventricular septal defect 2004 per echo  . [place]   . Deaf     Review of Systems  Constitutional: Positive for fever.  Respiratory: Negative for choking.   Cardiovascular: Negative for chest pain.  Gastrointestinal: Positive for nausea, vomiting, abdominal pain and anorexia. Negative for diarrhea, constipation, melena, hematochezia and flatus.       + dysphagia but no odynophagia  Genitourinary: Negative for dysuria and hematuria.  Musculoskeletal: Positive for myalgias.       Filed Vitals:   04/18/14 1026  BP: 126/74  Pulse: 75  Temp: 98.3 F (36.8 C)  TempSrc: Oral  Height: 5\' 7"  (1.702 m)    Weight: 189 lb 9.6 oz (86.002 kg)  SpO2: 100%    Objective:   Physical Exam  Constitutional: She is oriented to person, place, and time. She appears well-developed. No distress.  HENT:  Head: Normocephalic and atraumatic.  Mouth/Throat: Oropharynx is clear and moist. No oropharyngeal exudate.  Eyes: Conjunctivae and EOM are normal. Pupils are equal, round, and reactive to light.  Neck: Neck supple.  Cardiovascular: Normal rate, regular rhythm and normal heart sounds.  Exam reveals no gallop and no friction rub.   No murmur heard. Pulmonary/Chest: Effort normal and breath sounds normal. No respiratory distress. She has no wheezes. She has no rales.  Abdominal: Soft. Bowel sounds are normal. She exhibits no distension and no mass. There is tenderness. There is no rebound and no guarding.  She is diffusely TTP.  Musculoskeletal: Normal range of motion. She exhibits no edema or tenderness.  Neurological: She is alert and oriented to person, place, and time. No cranial nerve deficit.  Skin: Skin is warm. She is not diaphoretic.  Psychiatric: She has a normal mood and affect. Her behavior is normal. Judgment and thought content normal.  Vitals reviewed.         Assessment & Plan:  Please see problem based charting for A&P.

## 2014-04-19 NOTE — Assessment & Plan Note (Signed)
BP Readings from Last 3 Encounters:  04/18/14 126/74  04/14/14 137/82  04/12/14 135/90    Lab Results  Component Value Date   NA 136 04/14/2014   K 3.3* 04/14/2014   CREATININE 0.94 04/14/2014    Assessment: Blood pressure control:  well controlled Progress toward BP goal:   at goal Comments: She reports compliance with medications and has brought all of her med bottles.  She still has bottle for losartan 50mg  (take one pill daily).  She said she did not realize that I increased this med to 100mg  at our visit two weeks ago so she has only been taking 50mg  daily.  Plan: Medications:  continue current medications:  HCTZ 25mg  daily, losartan 50mg  daily (will continue at 50mg  dose daily since her BP is well controlled on this dose) Educational resources provided: brochure (denies) Other plans: follow-up BP next visit

## 2014-04-19 NOTE — Assessment & Plan Note (Addendum)
She reports epigastric pain described as burning as well as heartburn symptoms and vomiting that began 1 week ago, the day after she attended a funeral.  She noticed URI symptoms and so did some other people who went to the funeral; but she says nobody else has GI symptoms that she knows of.  She has been vomiting about once per day but is able to stay hydrated.  She ate breakfast this morning and has not vomited.  She denies diarrhea or constipation; she says she is having normal BMs daily and denies dark or bloody stools.  She has not been using NSAIDs recently (she only uses them occasionally).  She was given Miralax and Zofran on first visit to UC but says they did not help.  She was given PPI and Carafate at next visit to ED and says these have helped a lot.  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  Last EGD was in 2009 and revealed benign esophageal stricture and gastritis.  She describes dysphagia which seems related to reflux symptoms.  She denies odynophagia and her weight is stable so I do not think she needs immediate GI referral.  I am not concerned for obstruction (she is having normal BMs).  Her abdominal symptoms are likely related to gastroenteritis given the acute nature and they occurred after she ate food at a funeral/wake.  She says abdominal symptoms are better with PPI and carafate.   - continue PPI and Carafate - she was advised to avoid NSAIDs while she is experiencing this discomfort - she will return to see me in 2 weeks (or sooner if symptoms increase or stop responding to PPI); if symptoms persist despite PPI will refer to GI  - to ED with severe symptoms or if she is unable to stay hydrated - she agrees to above plan

## 2014-04-19 NOTE — Assessment & Plan Note (Addendum)
She had not been taking PPI in some time.  Current reflux symptoms are responding to PPI that was recently prescribed by ED provider.  - continue PPI and Carafate

## 2014-04-25 ENCOUNTER — Other Ambulatory Visit: Payer: Self-pay | Admitting: Internal Medicine

## 2014-04-25 ENCOUNTER — Ambulatory Visit (HOSPITAL_COMMUNITY)
Admission: RE | Admit: 2014-04-25 | Discharge: 2014-04-25 | Disposition: A | Discharge status: Home / Self Care | Payer: Medicaid Other | Source: Ambulatory Visit | Attending: Internal Medicine | Admitting: Internal Medicine

## 2014-04-25 DIAGNOSIS — Z1239 Encounter for other screening for malignant neoplasm of breast: Secondary | ICD-10-CM

## 2014-04-25 DIAGNOSIS — Z1231 Encounter for screening mammogram for malignant neoplasm of breast: Secondary | ICD-10-CM | POA: Diagnosis not present

## 2014-04-25 NOTE — Telephone Encounter (Signed)
Medication was approved 04/11/2014 through 04/11/2015

## 2014-04-30 ENCOUNTER — Ambulatory Visit
Admission: RE | Admit: 2014-04-30 | Discharge: 2014-04-30 | Disposition: A | Discharge status: Home / Self Care | Payer: No Typology Code available for payment source | Source: Ambulatory Visit

## 2014-04-30 ENCOUNTER — Encounter: Payer: Self-pay | Admitting: Family Medicine

## 2014-04-30 DIAGNOSIS — Z1231 Encounter for screening mammogram for malignant neoplasm of breast: Secondary | ICD-10-CM

## 2014-05-01 ENCOUNTER — Encounter: Payer: Self-pay | Admitting: Podiatry

## 2014-05-01 ENCOUNTER — Ambulatory Visit (INDEPENDENT_AMBULATORY_CARE_PROVIDER_SITE_OTHER): Payer: Medicaid Other | Admitting: Podiatry

## 2014-05-01 VITALS — BP 122/79 | HR 101 | Temp 99.2°F | Resp 14

## 2014-05-01 DIAGNOSIS — B353 Tinea pedis: Secondary | ICD-10-CM | POA: Diagnosis not present

## 2014-05-01 MED ORDER — ECONAZOLE NITRATE 1 % EX CREA: TOPICAL_CREAM | Freq: Every day | CUTANEOUS | Status: DC

## 2014-05-01 NOTE — Patient Instructions (Signed)
Apply antifungal cream daily to webspaces between toes 1, 2, 3, 4 on the right and left feet 30 days  Diabetes and Foot Care Diabetes may cause you to have problems because of poor blood supply (circulation) to your feet and legs. This may cause the skin on your feet to become thinner, break easier, and heal more slowly. Your skin may become dry, and the skin may peel and crack. You may also have nerve damage in your legs and feet causing decreased feeling in them. You may not notice minor injuries to your feet that could lead to infections or more serious problems. Taking care of your feet is one of the most important things you can do for yourself.  HOME CARE INSTRUCTIONS  Wear shoes at all times, even in the house. Do not go barefoot. Bare feet are easily injured.  Check your feet daily for blisters, cuts, and redness. If you cannot see the bottom of your feet, use a mirror or ask someone for help.  Wash your feet with warm water (do not use hot water) and mild soap. Then pat your feet and the areas between your toes until they are completely dry. Do not soak your feet as this can dry your skin.  Apply a moisturizing lotion or petroleum jelly (that does not contain alcohol and is unscented) to the skin on your feet and to dry, brittle toenails. Do not apply lotion between your toes.  Trim your toenails straight across. Do not dig under them or around the cuticle. File the edges of your nails with an emery board or nail file.  Do not cut corns or calluses or try to remove them with medicine.  Wear clean socks or stockings every day. Make sure they are not too tight. Do not wear knee-high stockings since they may decrease blood flow to your legs.  Wear shoes that fit properly and have enough cushioning. To break in new shoes, wear them for just a few hours a day. This prevents you from injuring your feet. Always look in your shoes before you put them on to be sure there are no objects  inside.  Do not cross your legs. This may decrease the blood flow to your feet.  If you find a minor scrape, cut, or break in the skin on your feet, keep it and the skin around it clean and dry. These areas may be cleansed with mild soap and water. Do not cleanse the area with peroxide, alcohol, or iodine.  When you remove an adhesive bandage, be sure not to damage the skin around it.  If you have a wound, look at it several times a day to make sure it is healing.  Do not use heating pads or hot water bottles. They may burn your skin. If you have lost feeling in your feet or legs, you may not know it is happening until it is too late.  Make sure your health care provider performs a complete foot exam at least annually or more often if you have foot problems. Report any cuts, sores, or bruises to your health care provider immediately. SEEK MEDICAL CARE IF:   You have an injury that is not healing.  You have cuts or breaks in the skin.  You have an ingrown nail.  You notice redness on your legs or feet.  You feel burning or tingling in your legs or feet.  You have pain or cramps in your legs and feet.  Your legs  or feet are numb.  Your feet always feel cold. SEEK IMMEDIATE MEDICAL CARE IF:   There is increasing redness, swelling, or pain in or around a wound.  There is a red line that goes up your leg.  Pus is coming from a wound.  You develop a fever or as directed by your health care provider.  You notice a bad smell coming from an ulcer or wound. Document Released: 12/19/1999 Document Revised: 08/23/2012 Document Reviewed: 05/30/2012 [facility] Patient Information 2015 [place], [place]. This information is not intended to replace advice given to you by your health care provider. Make sure you discuss any questions you have with your health care provider.

## 2014-05-01 NOTE — Progress Notes (Signed)
   Subjective:    Patient ID: Janice Massey, female    DOB: [birth year], 59 y.o.   MRN: [id number]  HPI  N-white spots,black area on left great toe L- in between all toes D-1 month, seen by physician O- had not notice, but was having them check every 6 months C- has not gotten any worse and does not hurt A- Does not bothers her in anyway but would like the black spot removed if possible T-None "itching and check for white spots in between all toes, and look at black spot on left great toe.there is  no pain, numbness and tenderness but burning on both feet after baths." Review of Systems  All other systems reviewed and are negative.      Objective:   Physical Exam  This patient is deaf and presents with interpreter Felicia who signs and communicates with the patient directly  Vascular: DP pulses 2/4 bilaterally PT pulses 1/4 bilaterally Capillary reflex immediate bilaterally  Neurological: Vibratory sensation intact bilaterally Sensation to 10 g monofilament wire intact 5/5 bilaterally Ankle reflex equal and reactive bilaterally  Dermatological: Well-healed surgical scar dorsal first MPJ left Fissures interdigital first, second, third fourth right. There is no erythema, edema or active drainage around these areas Fissures interdigital second, third, fourth left. The fourth left is scaling  Musculoskeletal: There is no restriction ankle, subtalar, midtarsal joints bilaterally      Assessment & Plan:   Assessment: Satisfactory neurovascular status Tinea pedis interdigital spaces 1-4 right and 2-4 left  Plan: Rx Econazole 1% cream dispensed 85 g toe. Apply daily to webspaces 1-4 bilaterally daily 30 days  Reappoint 30 days

## 2014-05-02 ENCOUNTER — Telehealth: Payer: Self-pay | Admitting: *Deleted

## 2014-05-02 NOTE — Telephone Encounter (Addendum)
Pt called stating the medication Dr. Amalia Hailey prescribed is too expensive and insurance won't cover, I want an alternate.  Pt spoke through an interpretator.  Dr. Amalia Hailey changed medication to Clotrimazole topical cream 30 gram tube apply between toes for 30 days.  Orders to [facility] and to pt's [phone] left a message.

## 2014-05-06 MED ORDER — CLOTRIMAZOLE-BETAMETHASONE 1-0.05 % EX CREA: 1.0000 "application " | TOPICAL_CREAM | Freq: Every day | CUTANEOUS | Status: DC

## 2014-05-08 ENCOUNTER — Encounter: Payer: Self-pay | Admitting: Internal Medicine

## 2014-05-08 ENCOUNTER — Ambulatory Visit (INDEPENDENT_AMBULATORY_CARE_PROVIDER_SITE_OTHER): Payer: Medicaid Other | Admitting: Internal Medicine

## 2014-05-08 VITALS — BP 144/84 | HR 83 | Temp 98.4°F | Ht 67.0 in | Wt 194.1 lb

## 2014-05-08 DIAGNOSIS — E876 Hypokalemia: Secondary | ICD-10-CM | POA: Diagnosis not present

## 2014-05-08 DIAGNOSIS — K219 Gastro-esophageal reflux disease without esophagitis: Secondary | ICD-10-CM

## 2014-05-08 DIAGNOSIS — R109 Unspecified abdominal pain: Secondary | ICD-10-CM | POA: Diagnosis not present

## 2014-05-08 DIAGNOSIS — I10 Essential (primary) hypertension: Secondary | ICD-10-CM

## 2014-05-08 DIAGNOSIS — R1013 Epigastric pain: Secondary | ICD-10-CM

## 2014-05-08 DIAGNOSIS — E119 Type 2 diabetes mellitus without complications: Secondary | ICD-10-CM

## 2014-05-08 LAB — COMPLETE METABOLIC PANEL WITH GFR
ALBUMIN: 4.3 g/dL (ref 3.5–5.2)
ALT: 27 U/L (ref 0–35)
AST: 23 U/L (ref 0–37)
Alkaline Phosphatase: 70 U/L (ref 39–117)
BUN: 17 mg/dL (ref 6–23)
CALCIUM: 9.3 mg/dL (ref 8.4–10.5)
CO2: 28 meq/L (ref 19–32)
Chloride: 101 mEq/L (ref 96–112)
Creat: 0.68 mg/dL (ref 0.50–1.10)
GFR, Est Non African American: >89 mL/min
Glucose, Bld: 114 mg/dL — ABNORMAL HIGH (ref 70–99)
POTASSIUM: 4.1 meq/L (ref 3.5–5.3)
SODIUM: 138 meq/L (ref 135–145)
TOTAL PROTEIN: 7.2 g/dL (ref 6.0–8.3)
Total Bilirubin: 0.7 mg/dL (ref 0.2–1.2)

## 2014-05-08 LAB — GLUCOSE, CAPILLARY: GLUCOSE-CAPILLARY: 123 mg/dL — AB (ref 70–99)

## 2014-05-08 MED ORDER — OMEPRAZOLE 20 MG PO CPDR: 20.0000 mg | DELAYED_RELEASE_CAPSULE | Freq: Every day | ORAL | Status: DC

## 2014-05-08 MED ORDER — LOSARTAN POTASSIUM 50 MG PO TABS: 50.0000 mg | ORAL_TABLET | Freq: Every day | ORAL | Status: DC

## 2014-05-08 MED ORDER — HYDROCHLOROTHIAZIDE 25 MG PO TABS: 25.0000 mg | ORAL_TABLET | Freq: Every day | ORAL | Status: DC

## 2014-05-08 MED ORDER — METFORMIN HCL 1000 MG PO TABS: 1000.0000 mg | ORAL_TABLET | Freq: Two times a day (BID) | ORAL | Status: DC

## 2014-05-08 NOTE — Patient Instructions (Signed)
1. I will call you if there are problems with your labs.   2. Please take all medications as prescribed.  I will send in your prescription refills.   3. If you have worsening of your symptoms or new symptoms arise, please call the clinic ([phone]), or go to the ER immediately if symptoms are severe.  Thank you for bringing your medicines today. This helps [place] keep you safe from mistakes.   Please come back to see me in 2 months.

## 2014-05-08 NOTE — Progress Notes (Signed)
Subjective:    Patient ID: Janice Massey, female    DOB: 01-26-1955, 59 y.o.   MRN: [id number]  HPI Comments: Janice Massey is a 59 year old woman with PMH as below here for follow-up of abdominal pain.  Please see problem based charting for A&P.    Past Medical History  Diagnosis Date  . Diabetes mellitus   . Hypertension   . Back pain   . Chronic PID   . Ventricular septal defect 2004 per echo  . Mute   . Deaf    Current Outpatient Prescriptions on File Prior to Visit  Medication Sig Dispense Refill  . albuterol (PROVENTIL HFA;VENTOLIN HFA) 108 (90 BASE) MCG/ACT inhaler Inhale 1-2 puffs into the lungs every 6 (six) hours as needed for wheezing or shortness of breath. 1 Inhaler 1  . aspirin 81 MG tablet Take 81 mg by mouth every morning.    Marland Kitchen atorvastatin (LIPITOR) 80 MG tablet Take 1 tablet (80 mg total) by mouth daily. 30 tablet 11  . clotrimazole-betamethasone (LOTRISONE) cream Apply 1 application topically daily. 30 g 0  . hydrochlorothiazide (HYDRODIURIL) 25 MG tablet TAKE 1 TABLET BY MOUTH EVERY DAY 90 tablet 1  . losartan (COZAAR) 50 MG tablet Take 1 tablet (50 mg total) by mouth daily. 30 tablet 1  . metFORMIN (GLUCOPHAGE) 1000 MG tablet TAKE 1 TABLET BY MOUTH TWICE DAILY 180 tablet 1  . omeprazole (PRILOSEC) 20 MG capsule Take 1 capsule (20 mg total) by mouth daily. 30 capsule 1  . ondansetron (ZOFRAN) 4 MG tablet   0  . sucralfate (CARAFATE) 1 GM/10ML suspension Take 10 mLs (1 g total) by mouth 4 (four) times daily. 420 mL 0   No current facility-administered medications on file prior to visit.    Review of Systems  Constitutional: Negative for fever, chills, appetite change and unexpected weight change.  Respiratory: Negative for shortness of breath.   Cardiovascular: Negative for chest pain, palpitations and leg swelling.  Gastrointestinal: Negative for nausea, vomiting, abdominal pain, diarrhea, constipation and blood in stool.  Genitourinary: Negative for dysuria  and hematuria.       Filed Vitals:   05/08/14 1604  BP: 144/84  Pulse: 83  Temp: 98.4 F (36.9 C)  TempSrc: Oral  Height: 5\' 7"  (1.702 m)  Weight: 194 lb 1.6 oz (88.043 kg)  SpO2: 98%     Objective:   Physical Exam  Constitutional: She is oriented to person, place, and time. She appears well-developed. No distress.  HENT:  Head: Normocephalic and atraumatic.  Mouth/Throat: Oropharynx is clear and moist. No oropharyngeal exudate.  Eyes: Conjunctivae and EOM are normal. Pupils are equal, round, and reactive to light.  Neck: Neck supple.  Cardiovascular: Normal rate, regular rhythm and intact distal pulses.  Exam reveals no gallop and no friction rub.   Murmur heard. Pulmonary/Chest: Effort normal and breath sounds normal. No respiratory distress. She has no wheezes. She has no rales.  Abdominal: Soft. Bowel sounds are normal. She exhibits no distension and no mass. There is no tenderness. There is no rebound and no guarding.  Musculoskeletal: Normal range of motion. She exhibits no edema or tenderness.  Neurological: She is alert and oriented to person, place, and time. No cranial nerve deficit.  Skin: Skin is warm. She is not diaphoretic.  Psychiatric: She has a normal mood and affect. Her behavior is normal.  Vitals reviewed.         Assessment & Plan:  Please see problem  based charting for A&P.

## 2014-05-08 NOTE — Assessment & Plan Note (Addendum)
Symptoms better on PPI.  She is using the Carafate less often. - refill Prilosec

## 2014-05-08 NOTE — Progress Notes (Signed)
Medicine attending: Medical history, presenting problems, physical findings, and medications, reviewed with Dr Alex Wilson and I concur with  her evaluation and management plan. 

## 2014-05-08 NOTE — Assessment & Plan Note (Addendum)
All symptoms have resolved.  I suspect she had a viral gastroenteritis given symptoms started after eating food outside of the home and the short duration of symptoms.  The PPI likely helped with reflux.  She has been using the PPI more often (not every day) and hasn't felt the need to use Carafate much.  She would like refill of Prilosec. - refill Prilosec and continue for next 2-6 months and then can try trial off PPI - return in 2 months for follow-up of chronic conditions or sooner prn

## 2014-05-19 ENCOUNTER — Emergency Department (INDEPENDENT_AMBULATORY_CARE_PROVIDER_SITE_OTHER)
Admission: EM | Admit: 2014-05-19 | Discharge: 2014-05-19 | Disposition: A | Discharge status: Home / Self Care | Payer: No Typology Code available for payment source | Source: Home / Self Care | Attending: Emergency Medicine | Admitting: Emergency Medicine

## 2014-05-19 ENCOUNTER — Telehealth: Payer: Self-pay | Admitting: Family Medicine

## 2014-05-19 ENCOUNTER — Emergency Department (INDEPENDENT_AMBULATORY_CARE_PROVIDER_SITE_OTHER): Payer: No Typology Code available for payment source

## 2014-05-19 DIAGNOSIS — K567 Ileus, unspecified: Secondary | ICD-10-CM

## 2014-05-19 MED ORDER — IBUPROFEN 800 MG PO TABS: 800.0000 mg | ORAL_TABLET | Freq: Three times a day (TID) | ORAL | Status: DC | PRN

## 2014-05-19 MED ORDER — ONDANSETRON 4 MG PO TBDP
4.0000 mg | ORAL_TABLET | Freq: Once | ORAL | Status: AC
  Administered 2014-05-19: 4 mg via ORAL

## 2014-05-19 MED ORDER — IBUPROFEN 800 MG PO TABS
ORAL_TABLET | ORAL | Status: AC
  Filled 2014-05-19: qty 1

## 2014-05-19 MED ORDER — PROMETHAZINE HCL 25 MG PO TABS: 25.0000 mg | ORAL_TABLET | Freq: Four times a day (QID) | ORAL | Status: DC | PRN

## 2014-05-19 MED ORDER — ONDANSETRON 4 MG PO TBDP
ORAL_TABLET | ORAL | Status: AC
  Filled 2014-05-19: qty 1

## 2014-05-19 MED ORDER — IBUPROFEN 800 MG PO TABS
800.0000 mg | ORAL_TABLET | Freq: Once | ORAL | Status: AC
  Administered 2014-05-19: 800 mg via ORAL

## 2014-05-19 NOTE — ED Notes (Signed)
Pt c/o stomach pain/cramps, nausea and vomiting x yesterday.

## 2014-05-19 NOTE — Telephone Encounter (Signed)
After hours line  Patient with nasuea and vomiting X 4-5 since yesterday. She is tolerating fluids today but has not made any urine today. She has a Hx of SBO and wants to go to the ED. She also has abd pain today. Her vomit is non bloody and non bilious.   I discussed urgent care vs ED, she would prefer to go to the ED given her Hx of SBO so she will likely go.   Murtis SinkSam Destyne Goodreau, MD [facility] Resident, PGY-3 05/19/2014, 9:46 AM

## 2014-05-19 NOTE — Discharge Instructions (Signed)
Part of your small bowel is being lazy. Stick with clear liquids until your pain improves. This means water, clear soda, chicken broth, Jell-O. Take ibuprofen 800 mg 3 times a day as needed for pain. Use Phenergan every 6 hours as needed for nausea and vomiting. If your pain has not resolved by Tuesday, your pain is getting worse, or you cannot keep liquids down, please go to the emergency room.   Ileus The intestine (bowel, or gut) is a long, muscular tube connecting your stomach to your rectum. If the intestine stops working, food cannot pass through. This is called an ileus. This can happen for a variety of reasons. Ileus is a major medical problem that usually requires hospitalization. If your intestine stops working because of a blockage, this is called a bowel obstruction and is a different condition. CAUSES   Surgery in your abdomen. This can last from a few hours to a few days.  An infection or inflammation in the belly (abdomen). This includes inflammation of the lining of the abdomen (peritonitis).  Infection or inflammation in other parts of the body, such as pneumonia or pancreatitis.  Passage of gallstones or kidney stones.  Damage to the nerves or blood vessels which go to the bowel.  Imbalance in the salts in the blood (electrolytes).  Injury to the brain and/or spinal cord.  Medications. Many medications can cause ileus or make it worse. The most common of these are strong pain medications. SYMPTOMS  Symptoms of bowel obstruction come from the bowel inactivity. They may include:  Bloating. Your belly gets bigger (distension).  Pain or discomfort in the abdomen.  Poor appetite, feeling sick to your stomach (nausea), and vomiting.  You may also not be able to hear your normal bowel sounds, such as "growling" in your stomach. DIAGNOSIS   Your history and a physical exam will usually suggest to your caregiver that you have an ileus.  X-rays or a CT scan of your  abdomen will confirm the diagnosis. X-rays, CT scans, and lab tests may also suggest the cause. TREATMENT   Rest the intestine until it starts working again. This is most often accomplished by:  Stopping intake of oral food and drink. Dehydration is prevented by using IV (intravenous) fluids.  Sometimes, a nasogastric tube (NG tube) is needed. This is a narrow plastic tube inserted through your nose and into your stomach. It is connected to suction to keep the stomach emptied out. This also helps treat the nausea and vomiting.  If there is an imbalance in the electrolytes, they are corrected with supplements in your intravenous fluids.  Medications that might make an ileus worse might be stopped.  There are no medications that reliably treat ileus, though your caregiver may suggest a trial of certain medications.  If your condition is slow to resolve, you will be reevaluated to be sure another condition, such as a blockage, is not present. Ileus is common and usually has a good outcome. Depending on the cause of your ileus, it usually can be treated by your caregivers with good results. Sometimes, specialists (surgeons or gastroenterologists) are asked to assist in your care.  HOME CARE INSTRUCTIONS   Follow your caregiver's instructions regarding diet and fluid intake. This will usually include drinking plenty of clear fluids, avoiding alcohol and caffeine, and eating a gentle diet.  Follow your caregiver's instructions regarding activity. A period of rest is sometimes advised before returning to work or school.  Take only medications prescribed by  your caregiver. Be especially careful with narcotic pain medication, which can slow your bowel activity and contribute to ileus.  Keep any follow-up appointments with your caregiver or specialists. SEEK MEDICAL CARE IF:   You have a recurrence of nausea, vomiting, or abdominal discomfort.  You develop fever of more than 102 F (38.9  C). SEEK IMMEDIATE MEDICAL CARE IF:   You have severe abdominal pain.  You are unable to keep fluids down. Document Released: 12/24/2002 Document Revised: 05/07/2013 Document Reviewed: 04/25/2008 [facility] Patient Information 2015 [place]. This information is not intended to replace advice given to you by your health care provider. Make sure you discuss any questions you have with your health care provider.

## 2014-05-19 NOTE — ED Provider Notes (Signed)
CSN: [id number]     Arrival date & time 05/19/14  1103 History   First MD Initiated Contact with Patient 05/19/14 1248     Chief Complaint  Patient presents with  . Abdominal Cramping   (Consider location/radiation/quality/duration/timing/severity/associated sxs/prior Treatment) HPI She is a 59 year old woman with a history of small bowel obstruction here for abdominal pain. She states this started last night with severe abdominal cramping and vomiting. She also reports feeling bloated. She has been able to keep down some ginger ale. She had a normal bowel movement yesterday. She has not had any bowel movements or flatus today. No fevers or chills. No questionable food or water intake. Her grandson was sick with vomiting last week.  Past Medical History  Diagnosis Date  . Small bowel obstruction 2013   Past Surgical History  Procedure Laterality Date  . Abdominal hysterectomy    . Cholecystectomy    . Abdominal hernia repair     No family history on file. History  Substance Use Topics  . Smoking status: Never Smoker   . Smokeless tobacco: Not on file  . Alcohol Use: Yes     Comment: sometimes   OB History    No data available     Review of Systems As in history of present illness Allergies  Review of patient's allergies indicates no known allergies.  Home Medications   Prior to Admission medications   Medication Sig Start Date End Date Taking? Authorizing Provider  benzonatate (TESSALON) 200 MG capsule Take 1 capsule (200 mg total) by mouth 3 (three) times daily as needed for cough. 04/02/13   Elson AreasLeslie K Sofia, PA-C  fluticasone [facility](FLONASE) 50 MCG/ACT nasal spray Place 2 sprays into both nostrils daily. 04/10/13   Garnetta BuddyEdward Williamson V, MD  guaiFENesin-codeine 100-10 MG/5ML syrup Take 5 mLs by mouth every 6 (six) hours as needed for cough. 04/10/13   Garnetta BuddyEdward Williamson V, MD  ibuprofen (ADVIL,MOTRIN) 800 MG tablet Take 1 tablet (800 mg total) by mouth every 8 (eight) hours as needed  for moderate pain. 05/19/14   Charm RingsErin J Tiwana Chavis, MD  levofloxacin (LEVAQUIN) 250 MG tablet Take 3 tablets (750 mg total) by mouth daily. 01/20/14   Oswaldo ConroyVictoria Creech, PA-C  nitroGLYCERIN (NITROSTAT) 0.4 MG SL tablet Place 1 tablet (0.4 mg total) under the tongue every 5 (five) minutes as needed for chest pain. 10/05/12   Elenora GammaSamuel L Bradshaw, MD  ondansetron (ZOFRAN ODT) 4 MG disintegrating tablet Take 1 tablet (4 mg total) by mouth every 8 (eight) hours as needed for nausea or vomiting. 02/17/13   Gerhard Munchobert Lockwood, MD  promethazine (PHENERGAN) 25 MG tablet Take 1 tablet (25 mg total) by mouth every 6 (six) hours as needed for nausea or vomiting. 05/19/14   Charm RingsErin J Allean Montfort, MD  sucralfate (CARAFATE) 1 G tablet Take 1 tablet (1 g total) by mouth 4 (four) times daily -  with meals and at bedtime. 03/07/14   Erasmo DownerAngela M Bacigalupo, MD   BP 113/79 mmHg  Pulse 87  Temp(Src) 98.4 F (36.9 C) (Oral)  Resp 16  SpO2 99% Physical Exam  Constitutional: She is oriented to person, place, and time. She appears well-developed and well-nourished. She appears distressed (looks uncomfortable).  Cardiovascular: Normal rate, regular rhythm and normal heart sounds.   No murmur heard. Pulmonary/Chest: Effort normal and breath sounds normal. No respiratory distress. She has no wheezes. She has no rales.  Abdominal: Soft. Bowel sounds are normal. She exhibits no distension. There is tenderness (Diffuse). There is  no rebound and no guarding.  Neurological: She is alert and oriented to person, place, and time.    ED Course  Procedures (including critical care time) Labs Review Labs Reviewed - No data to display  Imaging Review Dg Abd Acute W/chest  05/19/2014   CLINICAL DATA:  Abdominal cramping for 2 days  EXAM: DG ABDOMEN ACUTE W/ 1V CHEST  COMPARISON:  January 20, 2014  FINDINGS: There is no free air. Single loop of mild prominent air-filled small bowel is identified in the medial left abdomen. Bowel content is identified throughout  colon. No radiopaque calculi or other significant radiographic abnormality is seen. Prior cholecystectomy clips are noted. Heart size and mediastinal contours are within normal limits. Both lungs are clear.  IMPRESSION: Single loop of mild prominent air-filled small bowel loop in the left abdomen, probably focal ileus. There is no evidence of small bowel obstruction.  No acute cardiopulmonary disease.   Electronically Signed   By: Sherian ReinWei-Chen  Lin M.D.   On: 05/19/2014 13:52     MDM   1. Ileus    X-ray shows a focal ileus without obstruction. Zofran 4 mg ODT and ibuprofen 800 mg by mouth given. She is stable for discharge with bowel rest, clear liquids, ibuprofen when necessary, Phenergan when necessary. Return precautions reviewed as in after visit summary.    Charm RingsErin J Jaramiah Bossard, MD 05/19/14 [phone]

## 2014-05-20 ENCOUNTER — Telehealth: Payer: Self-pay | Admitting: Family Medicine

## 2014-05-20 ENCOUNTER — Inpatient Hospital Stay (HOSPITAL_COMMUNITY)
Admission: EM | Admit: 2014-05-20 | Discharge: 2014-05-24 | DRG: 389 | Disposition: A | Discharge status: Home / Self Care | Payer: No Typology Code available for payment source | Attending: Family Medicine | Admitting: Family Medicine

## 2014-05-20 ENCOUNTER — Encounter (HOSPITAL_COMMUNITY): Payer: Self-pay | Admitting: Emergency Medicine

## 2014-05-20 DIAGNOSIS — E876 Hypokalemia: Secondary | ICD-10-CM | POA: Diagnosis present

## 2014-05-20 DIAGNOSIS — K5660 Unspecified intestinal obstruction: Principal | ICD-10-CM | POA: Diagnosis present

## 2014-05-20 DIAGNOSIS — Z90710 Acquired absence of both cervix and uterus: Secondary | ICD-10-CM

## 2014-05-20 DIAGNOSIS — R001 Bradycardia, unspecified: Secondary | ICD-10-CM | POA: Diagnosis present

## 2014-05-20 DIAGNOSIS — R109 Unspecified abdominal pain: Secondary | ICD-10-CM

## 2014-05-20 DIAGNOSIS — E860 Dehydration: Secondary | ICD-10-CM | POA: Diagnosis present

## 2014-05-20 DIAGNOSIS — Z87440 Personal history of urinary (tract) infections: Secondary | ICD-10-CM

## 2014-05-20 DIAGNOSIS — N179 Acute kidney failure, unspecified: Secondary | ICD-10-CM | POA: Diagnosis present

## 2014-05-20 DIAGNOSIS — K567 Ileus, unspecified: Secondary | ICD-10-CM | POA: Diagnosis present

## 2014-05-20 DIAGNOSIS — R599 Enlarged lymph nodes, unspecified: Secondary | ICD-10-CM | POA: Diagnosis present

## 2014-05-20 DIAGNOSIS — K56609 Unspecified intestinal obstruction, unspecified as to partial versus complete obstruction: Secondary | ICD-10-CM | POA: Diagnosis present

## 2014-05-20 DIAGNOSIS — Z9049 Acquired absence of other specified parts of digestive tract: Secondary | ICD-10-CM | POA: Diagnosis present

## 2014-05-20 DIAGNOSIS — E781 Pure hyperglyceridemia: Secondary | ICD-10-CM | POA: Diagnosis present

## 2014-05-20 DIAGNOSIS — K660 Peritoneal adhesions (postprocedural) (postinfection): Secondary | ICD-10-CM | POA: Diagnosis present

## 2014-05-20 LAB — CBC WITH DIFFERENTIAL/PLATELET
Basophils Absolute: 0 10*3/uL (ref 0.0–0.1)
Basophils Relative: 0 % (ref 0–1)
EOS PCT: 1 % (ref 0–5)
Eosinophils Absolute: 0.1 10*3/uL (ref 0.0–0.7)
HEMATOCRIT: 39.9 % (ref 36.0–46.0)
Hemoglobin: 13.4 g/dL (ref 12.0–15.0)
LYMPHS ABS: 1.3 10*3/uL (ref 0.7–4.0)
Lymphocytes Relative: 15 % (ref 12–46)
MCH: 30.9 pg (ref 26.0–34.0)
MCHC: 33.6 g/dL (ref 30.0–36.0)
MCV: 91.9 fL (ref 78.0–100.0)
MONO ABS: 0.9 10*3/uL (ref 0.1–1.0)
Monocytes Relative: 10 % (ref 3–12)
Neutro Abs: 6.5 10*3/uL (ref 1.7–7.7)
Neutrophils Relative %: 74 % (ref 43–77)
Platelets: 286 10*3/uL (ref 150–400)
RBC: 4.34 MIL/uL (ref 3.87–5.11)
RDW: 12.6 % (ref 11.5–15.5)
WBC: 8.8 10*3/uL (ref 4.0–10.5)

## 2014-05-20 LAB — URINE MICROSCOPIC-ADD ON

## 2014-05-20 LAB — I-STAT CHEM 8, ED
BUN: 22 mg/dL — ABNORMAL HIGH (ref 6–20)
Calcium, Ion: 1.16 mmol/L (ref 1.12–1.23)
Chloride: 101 mmol/L (ref 101–111)
Creatinine, Ser: 1.1 mg/dL — ABNORMAL HIGH (ref 0.44–1.00)
Glucose, Bld: 111 mg/dL — ABNORMAL HIGH (ref 65–99)
HEMATOCRIT: 43 % (ref 36.0–46.0)
Hemoglobin: 14.6 g/dL (ref 12.0–15.0)
POTASSIUM: 3.2 mmol/L — AB (ref 3.5–5.1)
Sodium: 142 mmol/L (ref 135–145)
TCO2: 23 mmol/L (ref 0–100)

## 2014-05-20 LAB — URINALYSIS, ROUTINE W REFLEX MICROSCOPIC
Bilirubin Urine: NEGATIVE
GLUCOSE, UA: 100 mg/dL — AB
Ketones, ur: 15 mg/dL — AB
Nitrite: NEGATIVE
PH: 5 (ref 5.0–8.0)
Protein, ur: 30 mg/dL — AB
Specific Gravity, Urine: 1.016 (ref 1.005–1.030)
Urobilinogen, UA: 1 mg/dL (ref 0.0–1.0)

## 2014-05-20 NOTE — Telephone Encounter (Signed)
Wants dr Ermalinda Memosbradshaw to call her Had to go to urgent care yesterday "it is about my stomach and he knows what is going on"

## 2014-05-20 NOTE — ED Provider Notes (Signed)
CSN: [id number]     Arrival date & time 05/20/14  2053 History   First MD Initiated Contact with Patient 05/20/14 2351     This chart was scribed for Dione Boozeavid Uniqua Kihn, MD by Arlan OrganAshley Leger, ED Scribe. This patient was seen in room A08C/A08C and the patient's care was started 12:02 AM.   Chief Complaint  Patient presents with  . Abdominal Pain   The history is provided by the patient. No language interpreter was used.    HPI Comments: Brandi Mendez is a 59 y.o. female with a PMHx of small bowel obstruction who presents to the Emergency Department complaining of constant, ongoing, non-radiating diffuse abdominal pain x 3 days. Pain is described as cramping currently rated 8/10. She also reports mild nausea. Pt was evaluated at Urgent Care yesterday for same and underwent  X-Rays. At time of visit pt was diagnosed with Ileus and discharged home with prescriptions for Phenergan. However, pain has not improved since visit. Brandi Mendez denies any improvement with prescribed medication. No recent fever, chills, nausea, or vomiting. Last bowel movement yesterday. However, pt is passing gas as normal. PSHx includes abdominal hysterotomy, cholecystomy, and abdominal hernia repair. No known allergies to medications.  Past Medical History  Diagnosis Date  . Small bowel obstruction 2013   Past Surgical History  Procedure Laterality Date  . Abdominal hysterectomy    . Cholecystectomy    . Abdominal hernia repair     No family history on file. History  Substance Use Topics  . Smoking status: Never Smoker   . Smokeless tobacco: Not on file  . Alcohol Use: Yes     Comment: sometimes   OB History    No data available     Review of Systems  Constitutional: Negative for fever and chills.  Respiratory: Negative for shortness of breath.   Cardiovascular: Negative for chest pain.  Gastrointestinal: Positive for nausea and abdominal pain. Negative for vomiting, diarrhea and constipation.  Skin: Negative for  rash.  Psychiatric/Behavioral: Negative for confusion.      Allergies  Review of patient's allergies indicates no known allergies.  Home Medications   Prior to Admission medications   Medication Sig Start Date End Date Taking? Authorizing Provider  benzonatate (TESSALON) 200 MG capsule Take 1 capsule (200 mg total) by mouth 3 (three) times daily as needed for cough. 04/02/13   Elson AreasLeslie K Sofia, PA-C  fluticasone [employer](FLONASE) 50 MCG/ACT nasal spray Place 2 sprays into both nostrils daily. 04/10/13   Garnetta BuddyEdward Williamson V, MD  guaiFENesin-codeine 100-10 MG/5ML syrup Take 5 mLs by mouth every 6 (six) hours as needed for cough. 04/10/13   Garnetta BuddyEdward Williamson V, MD  ibuprofen (ADVIL,MOTRIN) 800 MG tablet Take 1 tablet (800 mg total) by mouth every 8 (eight) hours as needed for moderate pain. 05/19/14   Charm RingsErin J Honig, MD  levofloxacin (LEVAQUIN) 250 MG tablet Take 3 tablets (750 mg total) by mouth daily. 01/20/14   Oswaldo ConroyVictoria Creech, PA-C  nitroGLYCERIN (NITROSTAT) 0.4 MG SL tablet Place 1 tablet (0.4 mg total) under the tongue every 5 (five) minutes as needed for chest pain. 10/05/12   Elenora GammaSamuel L Bradshaw, MD  ondansetron (ZOFRAN ODT) 4 MG disintegrating tablet Take 1 tablet (4 mg total) by mouth every 8 (eight) hours as needed for nausea or vomiting. 02/17/13   Gerhard Munchobert Lockwood, MD  promethazine (PHENERGAN) 25 MG tablet Take 1 tablet (25 mg total) by mouth every 6 (six) hours as needed for nausea or vomiting. 05/19/14  Charm Rings, MD  sucralfate (CARAFATE) 1 G tablet Take 1 tablet (1 g total) by mouth 4 (four) times daily -  with meals and at bedtime. 03/07/14   Erasmo Downer, MD   Triage Vitals: BP 102/65 mmHg  Pulse 80  Temp(Src) 98.4 F (36.9 C) (Oral)  Resp 18  SpO2 98%   Physical Exam  Constitutional: She is oriented to person, place, and time. She appears well-developed and well-nourished. No distress.  HENT:  Head: Normocephalic and atraumatic.  Eyes: EOM are normal. Pupils are equal, round, and  reactive to light.  Neck: Normal range of motion. Neck supple. No JVD present.  Cardiovascular: Normal rate, regular rhythm and normal heart sounds.   No murmur heard. Pulmonary/Chest: Effort normal and breath sounds normal. She has no wheezes. She has no rales. She exhibits no tenderness.  Abdominal: Soft. She exhibits distension. She exhibits no mass. There is tenderness. There is no rebound and no guarding.  Slightly distended  Generalize tenderness and worse in parumbilical area Bowel sounds decreased   Musculoskeletal: Normal range of motion. She exhibits no edema.  Lymphadenopathy:    She has no cervical adenopathy.  Neurological: She is alert and oriented to person, place, and time. No cranial nerve deficit. She exhibits normal muscle tone. Coordination normal.  Skin: Skin is warm and dry. No rash noted.  Psychiatric: She has a normal mood and affect. Her behavior is normal. Judgment and thought content normal.  Nursing note and vitals reviewed.   ED Course  Procedures (including critical care time)  DIAGNOSTIC STUDIES: Oxygen Saturation is 98% on RA, Normal by my interpretation.    COORDINATION OF CARE: 12:07 AM- Will order CBC, i-stat chem 8, urinalysis, DG abd acute with chest. Will give fluids, Zofran, and Morphine. Discussed treatment plan with pt at bedside and pt agreed to plan.     Labs Review Results for orders placed or performed during the hospital encounter of 05/20/14  CBC with Differential  Result Value Ref Range   WBC 8.8 4.0 - 10.5 K/uL   RBC 4.34 3.87 - 5.11 MIL/uL   Hemoglobin 13.4 12.0 - 15.0 g/dL   HCT 40.1 02.7 - 25.3 %   MCV 91.9 78.0 - 100.0 fL   MCH 30.9 26.0 - 34.0 pg   MCHC 33.6 30.0 - 36.0 g/dL   RDW 66.4 40.3 - 47.4 %   Platelets 286 150 - 400 K/uL   Neutrophils Relative % 74 43 - 77 %   Neutro Abs 6.5 1.7 - 7.7 K/uL   Lymphocytes Relative 15 12 - 46 %   Lymphs Abs 1.3 0.7 - 4.0 K/uL   Monocytes Relative 10 3 - 12 %   Monocytes Absolute  0.9 0.1 - 1.0 K/uL   Eosinophils Relative 1 0 - 5 %   Eosinophils Absolute 0.1 0.0 - 0.7 K/uL   Basophils Relative 0 0 - 1 %   Basophils Absolute 0.0 0.0 - 0.1 K/uL  Urinalysis, Routine w reflex microscopic  Result Value Ref Range   Color, Urine YELLOW YELLOW   APPearance CLOUDY (A) CLEAR   Specific Gravity, Urine 1.016 1.005 - 1.030   pH 5.0 5.0 - 8.0   Glucose, UA 100 (A) NEGATIVE mg/dL   Hgb urine dipstick MODERATE (A) NEGATIVE   Bilirubin Urine NEGATIVE NEGATIVE   Ketones, ur 15 (A) NEGATIVE mg/dL   Protein, ur 30 (A) NEGATIVE mg/dL   Urobilinogen, UA 1.0 0.0 - 1.0 mg/dL   Nitrite NEGATIVE  NEGATIVE   Leukocytes, UA MODERATE (A) NEGATIVE  Urine microscopic-add on  Result Value Ref Range   Squamous Epithelial / LPF RARE RARE   WBC, UA 21-50 <3 WBC/hpf   RBC / HPF 0-2 <3 RBC/hpf   Bacteria, UA RARE RARE   Casts HYALINE CASTS (A) NEGATIVE  I-Stat Chem 8, ED  Result Value Ref Range   Sodium 142 135 - 145 mmol/L   Potassium 3.2 (L) 3.5 - 5.1 mmol/L   Chloride 101 101 - 111 mmol/L   BUN 22 (H) 6 - 20 mg/dL   Creatinine, Ser 1.611.10 (H) 0.44 - 1.00 mg/dL   Glucose, Bld 096111 (H) 65 - 99 mg/dL   Calcium, Ion 0.451.16 4.091.12 - 1.23 mmol/L   TCO2 23 0 - 100 mmol/L   Hemoglobin 14.6 12.0 - 15.0 g/dL   HCT 81.143.0 91.436.0 - 78.246.0 %  I-Stat CG4 Lactic Acid, ED  Result Value Ref Range   Lactic Acid, Venous 1.22 0.5 - 2.0 mmol/L   Imaging Review Dg Abd Acute W/chest  05/21/2014   CLINICAL DATA:  Acute onset of mid abdominal pain and nausea. Initial encounter.  EXAM: DG ABDOMEN ACUTE W/ 1V CHEST  COMPARISON:  Chest and abdominal radiographs performed 05/19/2014  FINDINGS: The lungs are well-aerated and clear. There is no evidence of focal opacification, pleural effusion or pneumothorax. The cardiomediastinal silhouette is within normal limits.  There is distention of small-bowel loops to 3.7 cm in diameter, with associated air-fluid levels. This raises concern for partial small bowel obstruction or  small bowel dysmotility. Residual air and stool are seen within the colon. No free intra-abdominal air is identified on the provided upright view.  No acute osseous abnormalities are seen; the sacroiliac joints are unremarkable in appearance. Clips are noted within the right upper quadrant, reflecting prior cholecystectomy. An anterior abdominal wall mesh is noted overlying the lower abdomen. Clips are seen overlying the mid pelvis.  IMPRESSION: 1. Distention of small-bowel loops to 3.7 cm in maximal diameter, with associated air-fluid levels. This raises concern for partial small bowel obstruction or small bowel dysmotility. Residual air and stool seen within the colon. No free intra-abdominal air seen. 2. No acute cardiopulmonary process seen.   Electronically Signed   By: Roanna RaiderJeffery  Chang M.D.   On: 05/21/2014 01:11   Dg Abd Acute W/chest  05/19/2014   CLINICAL DATA:  Abdominal cramping for 2 days  EXAM: DG ABDOMEN ACUTE W/ 1V CHEST  COMPARISON:  January 20, 2014  FINDINGS: There is no free air. Single loop of mild prominent air-filled small bowel is identified in the medial left abdomen. Bowel content is identified throughout colon. No radiopaque calculi or other significant radiographic abnormality is seen. Prior cholecystectomy clips are noted. Heart size and mediastinal contours are within normal limits. Both lungs are clear.  IMPRESSION: Single loop of mild prominent air-filled small bowel loop in the left abdomen, probably focal ileus. There is no evidence of small bowel obstruction.  No acute cardiopulmonary disease.   Electronically Signed   By: Sherian ReinWei-Chen  Lin M.D.   On: 05/19/2014 13:52   Mm Digital Screening Bilateral  04/30/2014   CLINICAL DATA:  Screening.  EXAM: DIGITAL SCREENING BILATERAL MAMMOGRAM WITH CAD  COMPARISON:  Previous exam(s).  ACR Breast Density Category c: The breast tissue is heterogeneously dense, which may obscure small masses.  FINDINGS: There are no findings suspicious for  malignancy. Images were processed with CAD.  IMPRESSION: No mammographic evidence of malignancy. A result letter of  this screening mammogram will be mailed directly to the patient.  RECOMMENDATION: Screening mammogram in one year. (Code:[id number]-[id number])  BI-RADS CATEGORY  1: Negative.   Electronically Signed   By: Norva Pavlov M.D.   On: 04/30/2014 12:28   Images viewed by me.    MDM   Final diagnoses:  Abdominal pain, unspecified abdominal location  SBO (small bowel obstruction)  Hypokalemia    Abdominal pain with nausea and vomiting worrisome for recurrent small bowel obstruction. Old records are reviewed and she had been admitted to the hospital in the past with small bowel obstruction. She was seen in urgent care yesterday with with brief port on x-ray of a single dilated small bowel loop. I reviewed those x-rays and I'm concerned that it actually represents an early small bowel obstruction. She is given IV fluids, morphine for pain, ondansetron for nausea and x-rays repeated now showing a classic picture of small bowel obstruction. Case is discussed with Dr. Waynetta Sandy of family practice who agrees to come to admit the patient.  I personally performed the services described in this documentation, which was scribed in my presence. The recorded information has been reviewed and is accurate.      Dione Booze, MD 05/21/14 [phone]

## 2014-05-20 NOTE — ED Notes (Signed)
Pt c/o generalized abd pain onset 3 days ago.  Pt st's she was seen at Urgent Care yesterday and dx with ileus.  Pt st's she has continued to have pain. Denies nausea or vomiting. Last bm yesterday

## 2014-05-20 NOTE — Telephone Encounter (Signed)
Family Medicine After hours phone call  Pt states she was seen yesterday in urgent care and diagnosed with ileus, told to not eat anything and follow up if not getting better. Pt reports she has not eaten anything today, no bowel movements but has passed some flatus. She reports pain is not any better. Informed her that my recommendation would be either to continue bowel rest or come in to be re-evaluated with repeat imaging. Reviewed UC note and she has had history of SBO. Pt has no further questions.  Tawni CarnesAndrew Valla Pacey, MD 05/20/2014, 8:23 PM PGY-2, [facility]

## 2014-05-21 ENCOUNTER — Emergency Department (HOSPITAL_COMMUNITY): Payer: No Typology Code available for payment source

## 2014-05-21 ENCOUNTER — Encounter (HOSPITAL_COMMUNITY): Payer: Self-pay | Admitting: General Surgery

## 2014-05-21 ENCOUNTER — Ambulatory Visit (INDEPENDENT_AMBULATORY_CARE_PROVIDER_SITE_OTHER): Payer: Medicaid Other | Admitting: Internal Medicine

## 2014-05-21 DIAGNOSIS — M62838 Other muscle spasm: Secondary | ICD-10-CM

## 2014-05-21 DIAGNOSIS — K56609 Unspecified intestinal obstruction, unspecified as to partial versus complete obstruction: Secondary | ICD-10-CM | POA: Diagnosis present

## 2014-05-21 DIAGNOSIS — R109 Unspecified abdominal pain: Secondary | ICD-10-CM | POA: Insufficient documentation

## 2014-05-21 DIAGNOSIS — E876 Hypokalemia: Secondary | ICD-10-CM | POA: Insufficient documentation

## 2014-05-21 DIAGNOSIS — K5669 Other intestinal obstruction: Secondary | ICD-10-CM

## 2014-05-21 DIAGNOSIS — N179 Acute kidney failure, unspecified: Secondary | ICD-10-CM

## 2014-05-21 LAB — BASIC METABOLIC PANEL
Anion gap: 6 (ref 5–15)
BUN: 15 mg/dL (ref 6–20)
CO2: 26 mmol/L (ref 22–32)
Calcium: 8.5 mg/dL — ABNORMAL LOW (ref 8.9–10.3)
Chloride: 112 mmol/L — ABNORMAL HIGH (ref 101–111)
Creatinine, Ser: 0.8 mg/dL (ref 0.44–1.00)
GFR calc Af Amer: >60 mL/min (ref >60)
GFR calc non Af Amer: >60 mL/min (ref >60)
Glucose, Bld: 84 mg/dL (ref 65–99)
POTASSIUM: 3.4 mmol/L — AB (ref 3.5–5.1)
SODIUM: 144 mmol/L (ref 135–145)

## 2014-05-21 LAB — TROPONIN I
Troponin I: <0.03 ng/mL (ref <0.031)
Troponin I: <0.03 ng/mL (ref <0.031)

## 2014-05-21 LAB — OCCULT BLOOD X 1 CARD TO LAB, STOOL: FECAL OCCULT BLD: NEGATIVE

## 2014-05-21 LAB — I-STAT CG4 LACTIC ACID, ED: Lactic Acid, Venous: 1.22 mmol/L (ref 0.5–2.0)

## 2014-05-21 LAB — TSH: TSH: 1.113 u[IU]/mL (ref 0.350–4.500)

## 2014-05-21 MED ORDER — IBUPROFEN 800 MG PO TABS: 800.0000 mg | ORAL_TABLET | Freq: Three times a day (TID) | ORAL | Status: DC

## 2014-05-21 MED ORDER — CYCLOBENZAPRINE HCL 10 MG PO TABS: 10.0000 mg | ORAL_TABLET | Freq: Three times a day (TID) | ORAL | Status: DC | PRN

## 2014-05-21 MED ORDER — POTASSIUM CHLORIDE 10 MEQ/100ML IV SOLN
10.0000 meq | Freq: Once | INTRAVENOUS | Status: AC
  Administered 2014-05-21: 10 meq via INTRAVENOUS
  Filled 2014-05-21: qty 100

## 2014-05-21 MED ORDER — POTASSIUM CHLORIDE CRYS ER 20 MEQ PO TBCR: 40.0000 meq | EXTENDED_RELEASE_TABLET | Freq: Once | ORAL | Status: DC

## 2014-05-21 MED ORDER — ONDANSETRON HCL 4 MG/2ML IJ SOLN: 4.0000 mg | Freq: Three times a day (TID) | INTRAMUSCULAR | Status: DC | PRN

## 2014-05-21 MED ORDER — ONDANSETRON HCL 4 MG/2ML IJ SOLN
4.0000 mg | Freq: Once | INTRAMUSCULAR | Status: AC
  Administered 2014-05-21: 4 mg via INTRAVENOUS
  Filled 2014-05-21: qty 2

## 2014-05-21 MED ORDER — HEPARIN SODIUM (PORCINE) 5000 UNIT/ML IJ SOLN
5000.0000 [IU] | Freq: Three times a day (TID) | INTRAMUSCULAR | Status: DC
  Administered 2014-05-21 – 2014-05-24 (×9): 5000 [IU] via SUBCUTANEOUS
  Filled 2014-05-21 (×9): qty 1

## 2014-05-21 MED ORDER — ONDANSETRON HCL 4 MG/2ML IJ SOLN: 4.0000 mg | Freq: Four times a day (QID) | INTRAMUSCULAR | Status: DC | PRN

## 2014-05-21 MED ORDER — MORPHINE SULFATE 4 MG/ML IJ SOLN
4.0000 mg | Freq: Once | INTRAMUSCULAR | Status: AC
  Administered 2014-05-21: 4 mg via INTRAVENOUS
  Filled 2014-05-21: qty 1

## 2014-05-21 MED ORDER — SODIUM CHLORIDE 0.9 % IV BOLUS (SEPSIS)
1000.0000 mL | Freq: Once | INTRAVENOUS | Status: AC
  Administered 2014-05-21: 1000 mL via INTRAVENOUS

## 2014-05-21 MED ORDER — SODIUM CHLORIDE 0.9 % IV SOLN
INTRAVENOUS | Status: DC
  Administered 2014-05-21 (×2): via INTRAVENOUS

## 2014-05-21 MED ORDER — MORPHINE SULFATE 4 MG/ML IJ SOLN
4.0000 mg | INTRAMUSCULAR | Status: DC | PRN
  Administered 2014-05-21: 4 mg via INTRAVENOUS
  Administered 2014-05-21: 2 mg via INTRAVENOUS
  Administered 2014-05-21 – 2014-05-22 (×2): 4 mg via INTRAVENOUS
  Filled 2014-05-21 (×4): qty 1

## 2014-05-21 MED ORDER — KCL IN DEXTROSE-NACL 20-5-0.2 MEQ/L-%-% IV SOLN
INTRAVENOUS | Status: DC
  Administered 2014-05-21 – 2014-05-23 (×3): via INTRAVENOUS
  Filled 2014-05-21 (×10): qty 1000

## 2014-05-21 MED ORDER — SODIUM CHLORIDE 0.9 % IV SOLN
1000.0000 mL | Freq: Once | INTRAVENOUS | Status: AC
  Administered 2014-05-21: 1000 mL via INTRAVENOUS

## 2014-05-21 MED ORDER — ONDANSETRON HCL 4 MG PO TABS: 4.0000 mg | ORAL_TABLET | Freq: Four times a day (QID) | ORAL | Status: DC | PRN

## 2014-05-21 MED ORDER — SODIUM CHLORIDE 0.9 % IV SOLN
1000.0000 mL | INTRAVENOUS | Status: DC
  Administered 2014-05-21: 1000 mL via INTRAVENOUS

## 2014-05-21 NOTE — Assessment & Plan Note (Addendum)
Pt states that she noticed neck swelling Sunday, 3 days ago when taking off her necklace. Since then swelling has gotten worse and she is having pain associated w/ neck swelling. She states it feels like a muscle ache. She has had something similar to this happen that self resolved but noted it took a while to get better. Denies heavy lifting or exercise. On exam can feel knot over trapezius muscle on the rt side and it is tender to palpation. She has full ROM of neck. There is also a sebaceous cyst on lateral side of neck that is non tender and non erythematous. Likely unrelated to neck pain she is having. Neck swelling is likely due to muscle spasm as her trapezius muscle was TTP and palpable knot was felt.   - rx for ibuprofen 800mg  TID and flexeril 10mg  TID prn for neck pain - instructed to apply warm compresses to neck which will also help with drainage of sebaceous cyst - RTC if pain does not improve in 7-10 days.

## 2014-05-21 NOTE — Patient Instructions (Signed)
Epidermal Cyst An epidermal cyst is usually a small, painless lump under the skin. Cysts often occur on the face, neck, stomach, chest, or genitals. The cyst may be filled with a bad smelling paste. Do not pop your cyst. Popping the cyst can cause pain and puffiness (swelling). HOME CARE   Only take medicines as told by your doctor.  Take your medicine (antibiotics) as told. Finish it even if you start to feel better. GET HELP RIGHT AWAY IF:  Your cyst is tender, red, or puffy.  You are not getting better, or you are getting worse.  You have any questions or concerns. MAKE SURE YOU:  Understand these instructions.  Will watch your condition.  Will get help right away if you are not doing well or get worse. Document Released: 01/29/2004 Document Revised: 06/22/2011 Document Reviewed: 06/29/2010 [facility] Patient Information 2015 [place], [place]. This information is not intended to replace advice given to you by your health care provider. Make sure you discuss any questions you have with your health care provider. Cyclobenzaprine tablets What is this medicine? CYCLOBENZAPRINE (sye kloe BEN za preen) is a muscle relaxer. It is used to treat muscle pain, spasms, and stiffness. This medicine may be used for other purposes; ask your health care provider or pharmacist if you have questions. COMMON BRAND NAME(S): Fexmid, Flexeril What should I tell my health care provider before I take this medicine? They need to know if you have any of these conditions: -heart disease, irregular heartbeat, or previous heart attack -liver disease -thyroid problem -an unusual or allergic reaction to cyclobenzaprine, tricyclic antidepressants, lactose, other medicines, foods, dyes, or preservatives -pregnant or trying to get pregnant -breast-feeding How should I use this medicine? Take this medicine by mouth with a glass of water. Follow the directions on the prescription label. If this medicine upsets your  stomach, take it with food or milk. Take your medicine at regular intervals. Do not take it more often than directed. Talk to your pediatrician regarding the use of this medicine in children. Special care may be needed. Overdosage: If you think you have taken too much of this medicine contact a poison control center or emergency room at once. NOTE: This medicine is only for you. Do not share this medicine with others. What if I miss a dose? If you miss a dose, take it as soon as you can. If it is almost time for your next dose, take only that dose. Do not take double or extra doses. What may interact with this medicine? Do not take this medicine with any of the following medications: -certain medicines for fungal infections like fluconazole, itraconazole, ketoconazole, posaconazole, voriconazole -cisapride -dofetilide -dronedarone -droperidol -flecainide -grepafloxacin -halofantrine -levomethadyl -MAOIs like Carbex, Eldepryl, Marplan, Nardil, and Parnate -nilotinib -pimozide -probucol -sertindole -thioridazine -ziprasidone This medicine may also interact with the following medications: -abarelix -alcohol -certain medicines for cancer -certain medicines for depression, anxiety, or psychotic disturbances -certain medicines for infection like alfuzosin, chloroquine, clarithromycin, levofloxacin, mefloquine, pentamidine, troleandomycin -certain medicines for an irregular heart beat -certain medicines used for sleep or numbness during surgery or procedure -contrast dyes -dolasetron -guanethidine -methadone -octreotide -ondansetron -other medicines that prolong the QT interval (cause an abnormal heart rhythm) -palonosetron -phenothiazines like chlorpromazine, mesoridazine, prochlorperazine, thioridazine -tramadol -vardenafil This list may not describe all possible interactions. Give your health care provider a list of all the medicines, herbs, non-prescription drugs, or dietary  supplements you use. Also tell them if you smoke, drink alcohol, or use illegal drugs.  Some items may interact with your medicine. What should I watch for while using this medicine? Check with your doctor or health care professional if your condition does not improve within 1 to 3 weeks. You may get drowsy or dizzy when you first start taking the medicine or change doses. Do not drive, use machinery, or do anything that may be dangerous until you know how the medicine affects you. Stand or sit up slowly. Your mouth may get dry. Drinking water, chewing sugarless gum, or sucking on hard candy may help. What side effects may I notice from receiving this medicine? Side effects that you should report to your doctor or health care professional as soon as possible: -allergic reactions like skin rash, itching or hives, swelling of the face, lips, or tongue -chest pain -fast heartbeat -hallucinations -seizures -vomiting Side effects that usually do not require medical attention (report to your doctor or health care professional if they continue or are bothersome): -headache This list may not describe all possible side effects. Call your doctor for medical advice about side effects. You may report side effects to FDA at 1-800-FDA-1088. Where should I keep my medicine? Keep out of the reach of children. Store at room temperature between 15 and 30 degrees C (59 and 86 degrees F). Keep container tightly closed. Throw away any unused medicine after the expiration date. NOTE: This sheet is a summary. It may not cover all possible information. If you have questions about this medicine, talk to your doctor, pharmacist, or health care provider.  2015, [employer]. (2012-07-18 12:48:19) Muscle Cramps and Spasms Muscle cramps and spasms occur when a muscle or muscles tighten and you have no control over this tightening (involuntary muscle contraction). They are a common problem and can develop in any  muscle. The most common place is in the calf muscles of the leg. Both muscle cramps and muscle spasms are involuntary muscle contractions, but they also have differences:   Muscle cramps are sporadic and painful. They may last a few seconds to a quarter of an hour. Muscle cramps are often more forceful and last longer than muscle spasms.  Muscle spasms may or may not be painful. They may also last just a few seconds or much longer. CAUSES  It is uncommon for cramps or spasms to be due to a serious underlying problem. In many cases, the cause of cramps or spasms is unknown. Some common causes are:   Overexertion.   Overuse from repetitive motions (doing the same thing over and over).   Remaining in a certain position for a long period of time.   Improper preparation, form, or technique while performing a sport or activity.   Dehydration.   Injury.   Side effects of some medicines.   Abnormally low levels of the salts and ions in your blood (electrolytes), especially potassium and calcium. This could happen if you are taking water pills (diuretics) or you are pregnant.  Some underlying medical problems can make it more likely to develop cramps or spasms. These include, but are not limited to:   Diabetes.   Parkinson disease.   Hormone disorders, such as thyroid problems.   Alcohol abuse.   Diseases specific to muscles, joints, and bones.   Blood vessel disease where not enough blood is getting to the muscles.  HOME CARE INSTRUCTIONS   Stay well hydrated. Drink enough water and fluids to keep your urine clear or pale yellow.  It may be helpful to massage, stretch, and  relax the affected muscle.  For tight or tense muscles, use a warm towel, heating pad, or hot shower water directed to the affected area.  If you are sore or have pain after a cramp or spasm, applying ice to the affected area may relieve discomfort.  Put ice in a plastic bag.  Place a towel  between your skin and the bag.  Leave the ice on for 15-20 minutes, 03-04 times a day.  Medicines used to treat a known cause of cramps or spasms may help reduce their frequency or severity. Only take over-the-counter or prescription medicines as directed by your caregiver. SEEK MEDICAL CARE IF:  Your cramps or spasms get more severe, more frequent, or do not improve over time.  MAKE SURE YOU:   Understand these instructions.  Will watch your condition.  Will get help right away if you are not doing well or get worse. Document Released: 06/12/2001 Document Revised: 04/17/2012 Document Reviewed: 12/08/2011 [employer] Patient Information 2015 [place], [place]. This information is not intended to replace advice given to you by your health care provider. Make sure you discuss any questions you have with your health care provider.

## 2014-05-21 NOTE — Progress Notes (Signed)
   Subjective:    Patient ID: Janice Massey, female    DOB: 07-04-1955, 59 y.o.   MRN: [id number]  HPI Pt is a 59 y/o female w/ PMHx of deafness, HTN, asthma, GERD, and DM who presents for an acute visit for rt neck swelling.     Review of Systems  Constitutional: Negative for fever, chills, activity change and appetite change.  HENT: Negative for trouble swallowing.   Respiratory: Negative for shortness of breath.   Musculoskeletal: Positive for neck pain. Negative for myalgias and arthralgias.  Skin: Negative for rash.  Neurological: Positive for headaches.       Objective:   Physical Exam  Constitutional: She appears well-developed and well-nourished.  HENT:  Head: Normocephalic.  Mouth/Throat: Oropharynx is clear and moist. No oropharyngeal exudate.  Neck: Normal range of motion.  Pain on rt lateral flexion of neck, tender to palpation of posterior region of neck along rt trapezius muscle, asymmetry noted btwn rt trapezius which as a knot, acanthosis nigricans on back of neck, sebaceous cyst on rt lateral side of neck that is non tender to palpation, non erythematous, and not actively draining.    Cardiovascular: Normal rate and regular rhythm.   Pulmonary/Chest: Effort normal and breath sounds normal.  Abdominal: Soft. Bowel sounds are normal.  Lymphadenopathy:    She has no cervical adenopathy.          Assessment & Plan:  Please see problem based assessment and plan.

## 2014-05-21 NOTE — Consult Note (Signed)
[facility] Surgery Consult Note  ALVILDA MCKENNA [birth year]  [phone].    Requesting MD: Dr. Nori Riis Chief Complaint/Reason for Consult: pSBO  HPI:  59 y/o AA female with PMH frequent bowel obstructions secondary to multiple abdominal surgeries in the past including hysterectomy, cholecystectomy, and hernia repair presents to [facility] with abdominal pain, nausea and vomiting.  She was seen in UC 2 days ago and did not see any improvement.  Pain is intermittent and crampy and 4-7/10.  She notes no more N/V, but abdominal pain in suprapubic region remains.  She notes flatus, but no recent BM.  No problems urinating, denies anorexia, fever/chills, CP/SOB.  She notes she had a similar episode last year and in 2013.  She denies any radicular pain, no precipitating or alleviating factors.    ROS: All systems reviewed and otherwise negative except for as above  No family history on file.  Past Medical History  Diagnosis Date  . Small bowel obstruction 2013, 2015    Past Surgical History  Procedure Laterality Date  . Abdominal hysterectomy      [facility]  . Cholecystectomy    . Abdominal hernia repair    . Left ankle surgery      Social History:  reports that she has never smoked. She does not have any smokeless tobacco history on file. She reports that she drinks alcohol. She reports that she does not use illicit drugs.  Allergies: No Known Allergies  Medications Prior to Admission  Medication Sig Dispense Refill  . ibuprofen (ADVIL,MOTRIN) 800 MG tablet Take 1 tablet (800 mg total) by mouth every 8 (eight) hours as needed for moderate pain. (Patient not taking: Reported on 05/21/2014) 30 tablet 0  . nitroGLYCERIN (NITROSTAT) 0.4 MG SL tablet Place 1 tablet (0.4 mg total) under the tongue every 5 (five) minutes as needed for chest pain. (Patient not taking: Reported on 05/21/2014) 20 tablet 3  . promethazine (PHENERGAN) 25 MG tablet Take 1 tablet (25 mg total) by mouth every 6 (six)  hours as needed for nausea or vomiting. (Patient not taking: Reported on 05/21/2014) 30 tablet 0  . sucralfate (CARAFATE) 1 G tablet Take 1 tablet (1 g total) by mouth 4 (four) times daily -  with meals and at bedtime. (Patient not taking: Reported on 05/21/2014) 90 tablet 1    Blood pressure 82/48, pulse 50, temperature 97.7 F (36.5 C), temperature source Oral, resp. rate 24, weight 78.7 kg (173 lb 8 oz), SpO2 97 %. Physical Exam: General: pleasant, WD/WN AA female who is laying in bed in NAD HEENT: head is normocephalic, atraumatic.  Sclera are noninjected.  PERRL.  Ears and nose without any masses or lesions.  Mouth is pink and moist Heart: regular, rate, and rhythm.  No obvious murmurs, gallops, or rubs noted.  Palpable pedal pulses bilaterally Lungs: CTAB, no wheezes, rhonchi, or rales noted.  Respiratory effort nonlabored Abd: soft, mild distension, mildly tender in the suprapubic area, +BS, no masses, hernias, or organomegaly, surgical scars as noted below MS: all 4 extremities are symmetrical with no cyanosis, clubbing, or edema. Skin: warm and dry with no masses, lesions, or rashes Psych: A&Ox3 with an appropriate affect.   Scar diagram Results for orders placed or performed during the hospital encounter of 05/20/14 (from the past 48 hour(s))  Urinalysis, Routine w reflex microscopic     Status: Abnormal   Collection Time: 05/20/14  8:59 PM  Result Value Ref Range   Color, Urine YELLOW YELLOW  APPearance CLOUDY (A) CLEAR   Specific Gravity, Urine 1.016 1.005 - 1.030   pH 5.0 5.0 - 8.0   Glucose, UA 100 (A) NEGATIVE mg/dL   Hgb urine dipstick MODERATE (A) NEGATIVE   Bilirubin Urine NEGATIVE NEGATIVE   Ketones, ur 15 (A) NEGATIVE mg/dL   Protein, ur 30 (A) NEGATIVE mg/dL   Urobilinogen, UA 1.0 0.0 - 1.0 mg/dL   Nitrite NEGATIVE NEGATIVE   Leukocytes, UA MODERATE (A) NEGATIVE  Urine microscopic-add on     Status: Abnormal   Collection Time: 05/20/14  8:59 PM  Result Value  Ref Range   Squamous Epithelial / LPF RARE RARE   WBC, UA 21-50 <3 WBC/hpf   RBC / HPF 0-2 <3 RBC/hpf   Bacteria, UA RARE RARE   Casts HYALINE CASTS (A) NEGATIVE  CBC with Differential     Status: None   Collection Time: 05/20/14  9:47 PM  Result Value Ref Range   WBC 8.8 4.0 - 10.5 K/uL   RBC 4.34 3.87 - 5.11 MIL/uL   Hemoglobin 13.4 12.0 - 15.0 g/dL   HCT 39.9 36.0 - 46.0 %   MCV 91.9 78.0 - 100.0 fL   MCH 30.9 26.0 - 34.0 pg   MCHC 33.6 30.0 - 36.0 g/dL   RDW 12.6 11.5 - 15.5 %   Platelets 286 150 - 400 K/uL   Neutrophils Relative % 74 43 - 77 %   Neutro Abs 6.5 1.7 - 7.7 K/uL   Lymphocytes Relative 15 12 - 46 %   Lymphs Abs 1.3 0.7 - 4.0 K/uL   Monocytes Relative 10 3 - 12 %   Monocytes Absolute 0.9 0.1 - 1.0 K/uL   Eosinophils Relative 1 0 - 5 %   Eosinophils Absolute 0.1 0.0 - 0.7 K/uL   Basophils Relative 0 0 - 1 %   Basophils Absolute 0.0 0.0 - 0.1 K/uL  I-Stat Chem 8, ED     Status: Abnormal   Collection Time: 05/20/14 10:05 PM  Result Value Ref Range   Sodium 142 135 - 145 mmol/L   Potassium 3.2 (L) 3.5 - 5.1 mmol/L   Chloride 101 101 - 111 mmol/L   BUN 22 (H) 6 - 20 mg/dL   Creatinine, Ser 1.10 (H) 0.44 - 1.00 mg/dL   Glucose, Bld 111 (H) 65 - 99 mg/dL   Calcium, Ion 1.16 1.12 - 1.23 mmol/L   TCO2 23 0 - 100 mmol/L   Hemoglobin 14.6 12.0 - 15.0 g/dL   HCT 43.0 36.0 - 46.0 %  I-Stat CG4 Lactic Acid, ED     Status: None   Collection Time: 05/21/14  1:00 AM  Result Value Ref Range   Lactic Acid, Venous 1.22 0.5 - 2.0 mmol/L  Basic metabolic panel     Status: Abnormal   Collection Time: 05/21/14 11:30 AM  Result Value Ref Range   Sodium 144 135 - 145 mmol/L   Potassium 3.4 (L) 3.5 - 5.1 mmol/L   Chloride 112 (H) 101 - 111 mmol/L   CO2 26 22 - 32 mmol/L   Glucose, Bld 84 65 - 99 mg/dL   BUN 15 6 - 20 mg/dL   Creatinine, Ser 0.80 0.44 - 1.00 mg/dL   Calcium 8.5 (L) 8.9 - 10.3 mg/dL   GFR calc non Af Amer >60 >60 mL/min   GFR calc Af Amer >60 >60 mL/min     Comment: (NOTE) The eGFR has been calculated using the CKD EPI equation. This calculation has not  been validated in all clinical situations. eGFR's persistently <60 mL/min signify possible Chronic Kidney Disease.    Anion gap 6 5 - 15   Dg Abd Acute W/chest  05/21/2014   CLINICAL DATA:  Acute onset of mid abdominal pain and nausea. Initial encounter.  EXAM: DG ABDOMEN ACUTE W/ 1V CHEST  COMPARISON:  Chest and abdominal radiographs performed 05/19/2014  FINDINGS: The lungs are well-aerated and clear. There is no evidence of focal opacification, pleural effusion or pneumothorax. The cardiomediastinal silhouette is within normal limits.  There is distention of small-bowel loops to 3.7 cm in diameter, with associated air-fluid levels. This raises concern for partial small bowel obstruction or small bowel dysmotility. Residual air and stool are seen within the colon. No free intra-abdominal air is identified on the provided upright view.  No acute osseous abnormalities are seen; the sacroiliac joints are unremarkable in appearance. Clips are noted within the right upper quadrant, reflecting prior cholecystectomy. An anterior abdominal wall mesh is noted overlying the lower abdomen. Clips are seen overlying the mid pelvis.  IMPRESSION: 1. Distention of small-bowel loops to 3.7 cm in maximal diameter, with associated air-fluid levels. This raises concern for partial small bowel obstruction or small bowel dysmotility. Residual air and stool seen within the colon. No free intra-abdominal air seen. 2. No acute cardiopulmonary process seen.   Electronically Signed   By: Garald Balding M.D.   On: 05/21/2014 01:11      Assessment/Plan pSBO - likely due to adhesion secondary to multiple abdominal surgeries -Patient is soft, WBC normal, BP a little soft 82/48, tender in lower abdomen, but passing flatus.   -Reasonable to try conservative management at this time.  Would recommend NG tube for decompression, but the  patient refuses.  Thus we can not do the small bowel follow through because she doesn't have the NG. -NPO, bowel rest, IVF, pain control, antiemetics.  Low threshold for NG tube placement -Ambulate and IS -Limit narcotics -SCD's and SQ heparin   Coralie Keens, [facility] Surgery 05/21/2014, 2:09 PM Pager: [phone]

## 2014-05-21 NOTE — Telephone Encounter (Signed)
Calling to return call. She is hospitalized, will see her there.   Murtis SinkSam Bradshaw, MD [facility] Resident, PGY-3 05/21/2014, 12:22 PM

## 2014-05-21 NOTE — Progress Notes (Signed)
Family Medicine Teaching Service Daily Progress Note Intern Pager: [phone]  Patient name: Brandi Mendez Medical record number: [id number] Date of birth: [birth year] Age: 59 y.o. Gender: female  Primary Care Provider: Kevin FentonBradshaw, Samuel, MD Consultants: surgery Code Status: FULL  Pt Overview and Major Events to Date:   Assessment and Plan: Brandi MusselLee N Tuminello is a 59 y.o. female presenting with abdominal pain and SBO. PMH is significant for hx of SBO, abdominal hysterectomy, hernia repair, cholecystectomy.   # SBO: seen in UC 2 days ago without improvement. Pt reporting flatus and one small BM between urgent care visit and presentation to the ED. Vitals stable. Lab workup unremarkable, Bmet slightly hypokalemic and Cr 1.1, no WBC, lactic acid normal. KUB shows distended small bowel with air-fluid levels, suspicious for SBO (bowel diameter 3.7cm max).  - NPO except for small amount of ice chips - pt agreeable to NGT if needed - morphine PRN for pain - c/s surgery: will see today  # AKI: Cr 1.1 bump from 0.7 baseline to 1.10 - likely dehydration from bowel rest - repeat bmet pending  # Hypokalemia - s/p potassium 10meq IV in ED  FEN/GI: NPO, NS @125cc /hr Prophylaxis: heparin sq  Disposition: observation  Subjective:  Patient reports that she is feeling ok.  Endorses nausea but no vomiting.  She reports continued discomfort in the LLQ.  She reports flatus this am but no BM since Monday.  We discussed potential need for NGT.  She is agreeable.  Objective: Temp:  [97.7 F (36.5 C)-98.4 F (36.9 C)] 97.7 F (36.5 C) (05/17 0626) Pulse Rate:  [54-80] 66 (05/17 0626) Resp:  [18-24] 24 (05/16 2359) BP: (88-114)/(56-71) 88/56 mmHg (05/17 0700) SpO2:  [92 %-100 %] 97 % (05/17 0626) Weight:  [173 lb 8 oz (78.7 kg)] 173 lb 8 oz (78.7 kg) (05/17 0227) Physical Exam: General: awake, alert, resting in bed, NAD Cardiovascular: RRR Respiratory: CTAB, no increased WOB Abdomen: flat, soft, +TTP in  the LLQ, no masses palpable, mild guarding but no rebound Extremities: Warm, +2DP Neuro: follows commands, no focal deficits  Laboratory:  Recent Labs Lab 05/20/14 2147 05/20/14 2205  WBC 8.8  --   HGB 13.4 14.6  HCT 39.9 43.0  PLT 286  --     Recent Labs Lab 05/20/14 2205  NA 142  K 3.2*  CL 101  BUN 22*  CREATININE 1.10*  GLUCOSE 111*    Results for orders placed or performed during the hospital encounter of 05/20/14 (from the past 24 hour(s))  Urinalysis, Routine w reflex microscopic     Status: Abnormal   Collection Time: 05/20/14  8:59 PM  Result Value Ref Range   Color, Urine YELLOW YELLOW   APPearance CLOUDY (A) CLEAR   Specific Gravity, Urine 1.016 1.005 - 1.030   pH 5.0 5.0 - 8.0   Glucose, UA 100 (A) NEGATIVE mg/dL   Hgb urine dipstick MODERATE (A) NEGATIVE   Bilirubin Urine NEGATIVE NEGATIVE   Ketones, ur 15 (A) NEGATIVE mg/dL   Protein, ur 30 (A) NEGATIVE mg/dL   Urobilinogen, UA 1.0 0.0 - 1.0 mg/dL   Nitrite NEGATIVE NEGATIVE   Leukocytes, UA MODERATE (A) NEGATIVE  Urine microscopic-add on     Status: Abnormal   Collection Time: 05/20/14  8:59 PM  Result Value Ref Range   Squamous Epithelial / LPF RARE RARE   WBC, UA 21-50 <3 WBC/hpf   RBC / HPF 0-2 <3 RBC/hpf   Bacteria, UA RARE RARE   Casts  HYALINE CASTS (A) NEGATIVE  CBC with Differential     Status: None   Collection Time: 05/20/14  9:47 PM  Result Value Ref Range   WBC 8.8 4.0 - 10.5 K/uL   RBC 4.34 3.87 - 5.11 MIL/uL   Hemoglobin 13.4 12.0 - 15.0 g/dL   HCT 69.6 29.5 - 28.4 %   MCV 91.9 78.0 - 100.0 fL   MCH 30.9 26.0 - 34.0 pg   MCHC 33.6 30.0 - 36.0 g/dL   RDW 13.2 44.0 - 10.2 %   Platelets 286 150 - 400 K/uL   Neutrophils Relative % 74 43 - 77 %   Neutro Abs 6.5 1.7 - 7.7 K/uL   Lymphocytes Relative 15 12 - 46 %   Lymphs Abs 1.3 0.7 - 4.0 K/uL   Monocytes Relative 10 3 - 12 %   Monocytes Absolute 0.9 0.1 - 1.0 K/uL   Eosinophils Relative 1 0 - 5 %   Eosinophils Absolute 0.1  0.0 - 0.7 K/uL   Basophils Relative 0 0 - 1 %   Basophils Absolute 0.0 0.0 - 0.1 K/uL  I-Stat Chem 8, ED     Status: Abnormal   Collection Time: 05/20/14 10:05 PM  Result Value Ref Range   Sodium 142 135 - 145 mmol/L   Potassium 3.2 (L) 3.5 - 5.1 mmol/L   Chloride 101 101 - 111 mmol/L   BUN 22 (H) 6 - 20 mg/dL   Creatinine, Ser 7.25 (H) 0.44 - 1.00 mg/dL   Glucose, Bld 366 (H) 65 - 99 mg/dL   Calcium, Ion 4.40 3.47 - 1.23 mmol/L   TCO2 23 0 - 100 mmol/L   Hemoglobin 14.6 12.0 - 15.0 g/dL   HCT 42.5 95.6 - 38.7 %  I-Stat CG4 Lactic Acid, ED     Status: None   Collection Time: 05/21/14  1:00 AM  Result Value Ref Range   Lactic Acid, Venous 1.22 0.5 - 2.0 mmol/L   Imaging/Diagnostic Tests: Dg Abd Acute W/chest  05/21/2014   CLINICAL DATA:  Acute onset of mid abdominal pain and nausea. Initial encounter.  EXAM: DG ABDOMEN ACUTE W/ 1V CHEST  COMPARISON:  Chest and abdominal radiographs performed 05/19/2014  FINDINGS: The lungs are well-aerated and clear. There is no evidence of focal opacification, pleural effusion or pneumothorax. The cardiomediastinal silhouette is within normal limits.  There is distention of small-bowel loops to 3.7 cm in diameter, with associated air-fluid levels. This raises concern for partial small bowel obstruction or small bowel dysmotility. Residual air and stool are seen within the colon. No free intra-abdominal air is identified on the provided upright view.  No acute osseous abnormalities are seen; the sacroiliac joints are unremarkable in appearance. Clips are noted within the right upper quadrant, reflecting prior cholecystectomy. An anterior abdominal wall mesh is noted overlying the lower abdomen. Clips are seen overlying the mid pelvis.  IMPRESSION: 1. Distention of small-bowel loops to 3.7 cm in maximal diameter, with associated air-fluid levels. This raises concern for partial small bowel obstruction or small bowel dysmotility. Residual air and stool seen  within the colon. No free intra-abdominal air seen. 2. No acute cardiopulmonary process seen.   Electronically Signed   By: Roanna Raider M.D.   On: 05/21/2014 01:11   Dg Abd Acute W/chest  05/19/2014   CLINICAL DATA:  Abdominal cramping for 2 days  EXAM: DG ABDOMEN ACUTE W/ 1V CHEST  COMPARISON:  January 20, 2014  FINDINGS: There is no free air. Single  loop of mild prominent air-filled small bowel is identified in the medial left abdomen. Bowel content is identified throughout colon. No radiopaque calculi or other significant radiographic abnormality is seen. Prior cholecystectomy clips are noted. Heart size and mediastinal contours are within normal limits. Both lungs are clear.  IMPRESSION: Single loop of mild prominent air-filled small bowel loop in the left abdomen, probably focal ileus. There is no evidence of small bowel obstruction.  No acute cardiopulmonary disease.   Electronically Signed   By: Sherian ReinWei-Chen  Lin M.D.   On: 05/19/2014 13:52     Raliegh IpAshly M Zayra Devito, DO 05/21/2014, 9:51 AM PGY-1, [facility] FPTS Intern pager: [phone], text pages welcome

## 2014-05-21 NOTE — H&P (Signed)
Family Medicine Teaching [facility] Admission History and Physical Service Pager: [phone]  Patient name: Brandi Mendez Medical record number: [id number] Date of birth: [birth year] Age: 59 y.o. Gender: female  Primary Care Provider: Kevin FentonBradshaw, Samuel, MD Consultants: none Code Status: Full  Chief Complaint: abdominal pain  Assessment and Plan: Brandi Mendez is a 59 y.o. female presenting with abdominal pain and SBO. PMH is significant for hx of SBO, abdominal hysterectomy, hernia repair, cholecystectomy.   # SBO: seen in UC 2 days ago without improvement. Pt reporting flatus and one small BM between urgent care visit and presentation to the ED. Vitals stable. Lab workup unremarkable, Bmet slightly hypokalemic and Cr 1.1, no WBC, lactic acid normal. KUB shows distended small bowel with air-fluid levels, suspicious for SBO (bowel diameter 3.7cm max).  - admit to observation - NPO except for small amount of ice chips - pt declines NG tube - morphine PRN for pain  # AKI: Cr 1.1 bump from 0.7 baseline to 1.10 - likely dehydration from bowel rest - repeat bmet in AM  # Hypokalemia - ordered for potassium IV in ED  FEN/GI: NPO, NS @125cc /hr Prophylaxis: heparin sq  Disposition: observation  History of Present Illness: Brandi Mendez is a 59 y.o. female presenting with abdominal pain. Pt seen in urgent care 2 days ago and diagnosed with ileus by KUB. Since that visit pt reports having flatus, one small bowel movement (last "large" BM was on Saturday, 3 days ago). She has had persistent abdominal pain that has not improved since the UC visit. Pain does not radiate, located primarily around belly button, feels sharp and crampy. She has not taken anything for the pain. She has had nausea but no vomiting. Denies any other pain. No CP, no SOB, no HA, no difficulty swallowing, no dysuria.  Pt does not smoke. Pt has significant history for multiple abdominal surgeries and prior SBO.  Review Of  Systems: Per HPI with the following additions: none Otherwise 12 point review of systems was performed and was unremarkable.  Patient Active Problem List   Diagnosis Date Noted  . SBO (small bowel obstruction) 05/21/2014  . UTI (urinary tract infection) 03/07/2014  . Bloody stool 07/26/2013  . Viral illness 01/22/2013  . Dysuria 10/05/2012  . Chest pain 10/05/2012  . Orthostatic hypotension 10/05/2012  . Abdominal pain, unspecified site 08/16/2012  . Nausea alone 08/16/2012  . Periauricular adenopathy 07/28/2012  . Dyspepsia 06/01/2012  . UTI (lower urinary tract infection) 02/04/2012  . Vaginal discharge 09/19/2010  . VAGINITIS 08/19/2009  . HEARTBURN 08/19/2009  . BREAST PAIN, LEFT 04/16/2009  . SMALL BOWEL OBSTRUCTION 03/25/2009  . CONSTIPATION 01/13/2009  . HYPERTRIGLYCERIDEMIA, MILD 12/26/2006  . OBESITY, UNSPECIFIED 12/26/2006   Past Medical History: Past Medical History  Diagnosis Date  . Small bowel obstruction 2013   Past Surgical History: Past Surgical History  Procedure Laterality Date  . Abdominal hysterectomy    . Cholecystectomy    . Abdominal hernia repair     Social History: History  Substance Use Topics  . Smoking status: Never Smoker   . Smokeless tobacco: Not on file  . Alcohol Use: Yes     Comment: sometimes   Additional social history: lives with daughter at home  Please also refer to relevant sections of EMR.  Family History: No family history on file. Allergies and Medications: No Known Allergies No current facility-administered medications on file prior to encounter.   Current Outpatient Prescriptions on File Prior to Encounter  Medication Sig Dispense Refill  . benzonatate (TESSALON) 200 MG capsule Take 1 capsule (200 mg total) by mouth 3 (three) times daily as needed for cough. 20 capsule 0  . fluticasone (FLONASE) 50 MCG/ACT nasal spray Place 2 sprays into both nostrils daily. 16 g 6  . guaiFENesin-codeine 100-10 MG/5ML syrup Take  5 mLs by mouth every 6 (six) hours as needed for cough. 120 mL 0  . ibuprofen (ADVIL,MOTRIN) 800 MG tablet Take 1 tablet (800 mg total) by mouth every 8 (eight) hours as needed for moderate pain. 30 tablet 0  . levofloxacin (LEVAQUIN) 250 MG tablet Take 3 tablets (750 mg total) by mouth daily. 15 tablet 0  . nitroGLYCERIN (NITROSTAT) 0.4 MG SL tablet Place 1 tablet (0.4 mg total) under the tongue every 5 (five) minutes as needed for chest pain. 20 tablet 3  . ondansetron (ZOFRAN ODT) 4 MG disintegrating tablet Take 1 tablet (4 mg total) by mouth every 8 (eight) hours as needed for nausea or vomiting. 10 tablet 0  . promethazine (PHENERGAN) 25 MG tablet Take 1 tablet (25 mg total) by mouth every 6 (six) hours as needed for nausea or vomiting. 30 tablet 0  . sucralfate (CARAFATE) 1 G tablet Take 1 tablet (1 g total) by mouth 4 (four) times daily -  with meals and at bedtime. 90 tablet 1  . [DISCONTINUED] omeprazole (PRILOSEC) 20 MG capsule Take 1 capsule (20 mg total) by mouth daily. for heartburn 30 capsule 0    Objective: BP 110/71 mmHg  Pulse 64  Temp(Src) 98.4 F (36.9 C) (Oral)  Resp 24  SpO2 92% Exam: General: NAD Eyes: PERRL, EOMI. ENTM: NCAT. Mucous membranes are dry Neck: supple, no thyromegaly Cardiovascular: RRR, nl s1s2, no mrg. 2+ radial, PT pulses bilaterally. No LE edema. Respiratory: CTAB, normal effort Abdomen: soft, tender around umbilicus, no guarding or rebound, bowel sounds are present MSK: no clubbing. Normal tone. Skin: no rashes. Abdominal surgical scar well healed Neuro: alert and oriented, no focal deficits Psych: normal thought content and speech.  Labs and Imaging: CBC BMET   Recent Labs Lab 05/20/14 2147 05/20/14 2205  WBC 8.8  --   HGB 13.4 14.6  HCT 39.9 43.0  PLT 286  --     Recent Labs Lab 05/20/14 2205  NA 142  K 3.2*  CL 101  BUN 22*  CREATININE 1.10*  GLUCOSE 111*     Dg Abd Acute W/chest  05/21/2014   CLINICAL DATA:  Acute onset  of mid abdominal pain and nausea. Initial encounter.  EXAM: DG ABDOMEN ACUTE W/ 1V CHEST  COMPARISON:  Chest and abdominal radiographs performed 05/19/2014  FINDINGS: The lungs are well-aerated and clear. There is no evidence of focal opacification, pleural effusion or pneumothorax. The cardiomediastinal silhouette is within normal limits.  There is distention of small-bowel loops to 3.7 cm in diameter, with associated air-fluid levels. This raises concern for partial small bowel obstruction or small bowel dysmotility. Residual air and stool are seen within the colon. No free intra-abdominal air is identified on the provided upright view.  No acute osseous abnormalities are seen; the sacroiliac joints are unremarkable in appearance. Clips are noted within the right upper quadrant, reflecting prior cholecystectomy. An anterior abdominal wall mesh is noted overlying the lower abdomen. Clips are seen overlying the mid pelvis.  IMPRESSION: 1. Distention of small-bowel loops to 3.7 cm in maximal diameter, with associated air-fluid levels. This raises concern for partial small bowel obstruction or small bowel  dysmotility. Residual air and stool seen within the colon. No free intra-abdominal air seen. 2. No acute cardiopulmonary process seen.   Electronically Signed   By: Roanna Raider M.D.   On: 05/21/2014 01:11   Dg Abd Acute W/chest  05/19/2014   CLINICAL DATA:  Abdominal cramping for 2 days  EXAM: DG ABDOMEN ACUTE W/ 1V CHEST  COMPARISON:  January 20, 2014  FINDINGS: There is no free air. Single loop of mild prominent air-filled small bowel is identified in the medial left abdomen. Bowel content is identified throughout colon. No radiopaque calculi or other significant radiographic abnormality is seen. Prior cholecystectomy clips are noted. Heart size and mediastinal contours are within normal limits. Both lungs are clear.  IMPRESSION: Single loop of mild prominent air-filled small bowel loop in the left abdomen,  probably focal ileus. There is no evidence of small bowel obstruction.  No acute cardiopulmonary disease.   Electronically Signed   By: Sherian Rein M.D.   On: 05/19/2014 13:52    Nani Ravens, MD 05/21/2014, 12:51 AM PGY-2, [facility] FPTS Intern pager: [phone], text pages welcome

## 2014-05-22 ENCOUNTER — Ambulatory Visit (INDEPENDENT_AMBULATORY_CARE_PROVIDER_SITE_OTHER): Payer: Medicaid Other | Admitting: Podiatry

## 2014-05-22 ENCOUNTER — Telehealth: Payer: Self-pay | Admitting: *Deleted

## 2014-05-22 VITALS — BP 156/91 | HR 90 | Resp 16

## 2014-05-22 DIAGNOSIS — B353 Tinea pedis: Secondary | ICD-10-CM

## 2014-05-22 LAB — BASIC METABOLIC PANEL
ANION GAP: 6 (ref 5–15)
BUN: 9 mg/dL (ref 6–20)
CHLORIDE: 110 mmol/L (ref 101–111)
CO2: 24 mmol/L (ref 22–32)
Calcium: 8.4 mg/dL — ABNORMAL LOW (ref 8.9–10.3)
Creatinine, Ser: 0.73 mg/dL (ref 0.44–1.00)
GFR calc non Af Amer: >60 mL/min (ref >60)
Glucose, Bld: 105 mg/dL — ABNORMAL HIGH (ref 65–99)
Potassium: 3.5 mmol/L (ref 3.5–5.1)
Sodium: 140 mmol/L (ref 135–145)

## 2014-05-22 LAB — CBC
HCT: 32 % — ABNORMAL LOW (ref 36.0–46.0)
Hemoglobin: 10.8 g/dL — ABNORMAL LOW (ref 12.0–15.0)
MCH: 31.1 pg (ref 26.0–34.0)
MCHC: 33.8 g/dL (ref 30.0–36.0)
MCV: 92.2 fL (ref 78.0–100.0)
Platelets: 220 10*3/uL (ref 150–400)
RBC: 3.47 MIL/uL — ABNORMAL LOW (ref 3.87–5.11)
RDW: 12.7 % (ref 11.5–15.5)
WBC: 5.6 10*3/uL (ref 4.0–10.5)

## 2014-05-22 LAB — TROPONIN I: Troponin I: <0.03 ng/mL (ref <0.031)

## 2014-05-22 MED ORDER — TRAMADOL HCL 50 MG PO TABS: 50.0000 mg | ORAL_TABLET | Freq: Four times a day (QID) | ORAL | Status: DC | PRN

## 2014-05-22 MED ORDER — MORPHINE SULFATE 2 MG/ML IJ SOLN
2.0000 mg | INTRAMUSCULAR | Status: DC | PRN
  Administered 2014-05-22 (×2): 2 mg via INTRAVENOUS
  Filled 2014-05-22 (×2): qty 1

## 2014-05-22 MED ORDER — MORPHINE SULFATE 2 MG/ML IJ SOLN: 1.0000 mg | INTRAMUSCULAR | Status: DC | PRN

## 2014-05-22 NOTE — Telephone Encounter (Signed)
Received PA request from pt's pharamacy for the losartan 50 mg tabs.  Because pt had tried and failed an ACE-request was approved through 05/17/2015.  Of note, previous PA was done on 04/11/14 for losartan 100mg  tabs, which was approved.  Will forward info to pcp to confirm dose.  Please advise.Despina Hidden Cassady5/18/201610:58 AM      [phone] Pt ID# [id number] P Conf# [id number]

## 2014-05-22 NOTE — Telephone Encounter (Signed)
Correct dose of losartan is 50mg  daily.  I had initially increased dose to 100mg  (on 04/07) but patient did not get the increase and subsequent blood pressures were okay on 50mg  so I decided to stick with the 50mg  daily dose.

## 2014-05-22 NOTE — Patient Instructions (Signed)
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

## 2014-05-22 NOTE — Progress Notes (Signed)
Patient ID: Janice Massey, female   DOB: 10/14/1955, 59 y.o.   MRN: [id number]  Subjective: This patient presents for follow-up visit of 05/01/2014. Tinea pedis was diagnosed in the interdigital spaces 1-4 right and 2-fourth left. Econazole 1% cream was prescribed. This was not on patient's formulary and clotrimazole cream was prescribed, however, Lotrisone was dispensed and patient  been using this medication since then  Objective: Deaf patient presents with interpreter  Vascular: DP pulses 2/4 bilaterally PT pulses 1/4 bilaterally  Neurological: Deferred  Dermatological: There is no scaling, fissuring, or skin lesions noted in the the interdigital spaces 1-4 bilaterally  Assessment: Resolving interdigital tinea bilaterally  Plan: All have patient continue to apply the Lotrisone on a daily basis to the end of the month for a total of 30 days  Follow-up at patient's request

## 2014-05-22 NOTE — Progress Notes (Signed)
Pt a/o, oob to BR ad lib, pt c/o pain PRN morphine given as ordered, pt on VSS, D5 0.5%NS with 20 mEq K @ 74 ml/hr, VSS, pt stable

## 2014-05-22 NOTE — Progress Notes (Signed)
Patient ID: Brandi Mendez, female   DOB: 1955/06/27, 59 y.o.   MRN: [id number]   LOS: 1 day   Subjective: Had BM last night. Pain a little better. Denies N/V.   Objective: Vital signs in last 24 hours: Temp:  [98.2 F (36.8 C)-98.8 F (37.1 C)] 98.8 F (37.1 C) (05/18 0524) Pulse Rate:  [46-68] 68 (05/18 0524) Resp:  [15-16] 16 (05/18 0524) BP: (82-121)/(46-59) 121/59 mmHg (05/18 0524) SpO2:  [95 %-98 %] 95 % (05/18 0524) Last BM Date: 05/19/14   Laboratory  CBC  Recent Labs  05/20/14 2147 05/20/14 2205 05/22/14 0838  WBC 8.8  --  5.6  HGB 13.4 14.6 10.8*  HCT 39.9 43.0 32.0*  PLT 286  --  220   BMET  Recent Labs  05/21/14 1130 05/22/14 0838  NA 144 140  K 3.4* 3.5  CL 112* 110  CO2 26 24  GLUCOSE 84 105*  BUN 15 9  CREATININE 0.80 0.73  CALCIUM 8.5* 8.4*     Physical Exam General appearance: alert and no distress Resp: clear to auscultation bilaterally Cardio: regular rate and rhythm GI: Soft, minimally TTP, +BS   Assessment/Plan: SBO -- Will advance to clears.    Freeman CaldronMichael J. Lincoln Ginley, PA-C Pager: [phone] 05/22/2014

## 2014-05-22 NOTE — Progress Notes (Signed)
Family Medicine Teaching Service Daily Progress Note Intern Pager: [phone]  Patient name: Brandi Mendez Medical record number: [id number] Date of birth: [birth year] Age: 59 y.o. Gender: female  Primary Care Provider: Kevin FentonBradshaw, Samuel, MD Consultants: surgery Code Status: FULL  Pt Overview and Major Events to Date:   Assessment and Plan: Brandi Mendez is a 59 y.o. female presenting with abdominal pain and SBO. PMH is significant for hx of SBO, abdominal hysterectomy, hernia repair, cholecystectomy.   # SBO: Improving. Vitals stable. Lab workup unremarkable, Bmet slightly hypokalemic and Cr 1.1, no WBC, lactic acid normal. KUB shows distended small bowel with air-fluid levels, suspicious for SBO (bowel diameter 3.7cm max). She had a small BM yesterday evening. Overall feels better and is improving.  - Surgery on board. Appreciate recs > Advance to clears today.  - No NGT in place as patient has been refusing.  - morphine dose reduced to avoid motility impairment.  - Continue to follow clinical improvement and tolerance of diet.  - Continued on D5 1/4 NS pending further diet advancement.    # AKI: Cr 1.1 to 0.73 today. Improving.  - likely dehydration from bowel rest  # Hypokalemia: Resolved - s/p potassium 10meq IV in ED  # Sinus Bradycardia - Her pulse is baseline between 60-80 but has intermittently been down to the 50's while here. No chest pain. EKG with sinus bradycardia, Tele without any events. Troponins were cycled and were negative. TSH is normal. No specific etiology at this point as electrolytes are normal. Could be vagally mediated given ongoing SBO.  - Continue to monitor on tele.  - Further workup as indicated by symptoms.  - HR currently stable in the 60's.   FEN/GI: Clear liquid diet, D5, 1/4NS 75cc/hr.  Prophylaxis: heparin sq  Disposition: observation  Subjective:  She reports small BM yesterday evening. +Flatus with voiding. She has improved abdominal discomfort.  She denies nausea / vomiting. She overall feels much improved. No fevers or chills. No complaints. Is agreeable to continue to monitor her improvement with plan to advance her diet as she is able.   Objective: Temp:  [98.2 F (36.8 C)-98.8 F (37.1 C)] 98.8 F (37.1 C) (05/18 0524) Pulse Rate:  [46-68] 68 (05/18 0524) Resp:  [15-16] 16 (05/18 0524) BP: (82-121)/(46-59) 121/59 mmHg (05/18 0524) SpO2:  [95 %-98 %] 95 % (05/18 0524) Physical Exam: General: AAOx3, NAD Cardiovascular: RRR, No MGR, Normal S1/S2, No heaves, no thrills Respiratory: CTA B, Appropriate rate, unlabored. No crackles, no rales.  Abdomen: S, ND, Hypoactive bowel sounds, Tenderness much improved. Very little tenderness. No peritoneal signs. No organomegaly.  Extremities: WWP, 2+ distal pulses, No edema. MAEW.  Neuro: AAOx3,  no focal deficits Psych: Appropriate affect.   Laboratory:  Recent Labs Lab 05/20/14 2147 05/20/14 2205 05/22/14 0838  WBC 8.8  --  5.6  HGB 13.4 14.6 10.8*  HCT 39.9 43.0 32.0*  PLT 286  --  220    Recent Labs Lab 05/20/14 2205 05/21/14 1130 05/22/14 0838  NA 142 144 140  K 3.2* 3.4* 3.5  CL 101 112* 110  CO2  --  26 24  BUN 22* 15 9  CREATININE 1.10* 0.80 0.73  CALCIUM  --  8.5* 8.4*  GLUCOSE 111* 84 105*    Results for orders placed or performed during the hospital encounter of 05/20/14 (from the past 24 hour(s))  Troponin I (q 6hr x 3)     Status: None   Collection Time: 05/21/14  3:55 PM  Result Value Ref Range   Troponin I <0.03 <0.031 ng/mL  TSH     Status: None   Collection Time: 05/21/14  3:55 PM  Result Value Ref Range   TSH 1.113 0.350 - 4.500 uIU/mL  Occult blood card to lab, stool RN will collect     Status: None   Collection Time: 05/21/14  4:10 PM  Result Value Ref Range   Fecal Occult Bld NEGATIVE NEGATIVE  Troponin I (q 6hr x 3)     Status: None   Collection Time: 05/21/14  7:45 PM  Result Value Ref Range   Troponin I <0.03 <0.031 ng/mL  Troponin  I (q 6hr x 3)     Status: None   Collection Time: 05/22/14  1:45 AM  Result Value Ref Range   Troponin I <0.03 <0.031 ng/mL  Basic metabolic panel     Status: Abnormal   Collection Time: 05/22/14  8:38 AM  Result Value Ref Range   Sodium 140 135 - 145 mmol/L   Potassium 3.5 3.5 - 5.1 mmol/L   Chloride 110 101 - 111 mmol/L   CO2 24 22 - 32 mmol/L   Glucose, Bld 105 (H) 65 - 99 mg/dL   BUN 9 6 - 20 mg/dL   Creatinine, Ser 0.100.73 0.44 - 1.00 mg/dL   Calcium 8.4 (L) 8.9 - 10.3 mg/dL   GFR calc non Af Amer >60 >60 mL/min   GFR calc Af Amer >60 >60 mL/min   Anion gap 6 5 - 15  CBC     Status: Abnormal   Collection Time: 05/22/14  8:38 AM  Result Value Ref Range   WBC 5.6 4.0 - 10.5 K/uL   RBC 3.47 (L) 3.87 - 5.11 MIL/uL   Hemoglobin 10.8 (L) 12.0 - 15.0 g/dL   HCT 27.232.0 (L) 53.636.0 - 64.446.0 %   MCV 92.2 78.0 - 100.0 fL   MCH 31.1 26.0 - 34.0 pg   MCHC 33.8 30.0 - 36.0 g/dL   RDW 03.412.7 74.211.5 - 59.515.5 %   Platelets 220 150 - 400 K/uL   Imaging/Diagnostic Tests: Dg Abd Acute W/chest  05/21/2014   CLINICAL DATA:  Acute onset of mid abdominal pain and nausea. Initial encounter.  EXAM: DG ABDOMEN ACUTE W/ 1V CHEST  COMPARISON:  Chest and abdominal radiographs performed 05/19/2014  FINDINGS: The lungs are well-aerated and clear. There is no evidence of focal opacification, pleural effusion or pneumothorax. The cardiomediastinal silhouette is within normal limits.  There is distention of small-bowel loops to 3.7 cm in diameter, with associated air-fluid levels. This raises concern for partial small bowel obstruction or small bowel dysmotility. Residual air and stool are seen within the colon. No free intra-abdominal air is identified on the provided upright view.  No acute osseous abnormalities are seen; the sacroiliac joints are unremarkable in appearance. Clips are noted within the right upper quadrant, reflecting prior cholecystectomy. An anterior abdominal wall mesh is noted overlying the lower abdomen.  Clips are seen overlying the mid pelvis.  IMPRESSION: 1. Distention of small-bowel loops to 3.7 cm in maximal diameter, with associated air-fluid levels. This raises concern for partial small bowel obstruction or small bowel dysmotility. Residual air and stool seen within the colon. No free intra-abdominal air seen. 2. No acute cardiopulmonary process seen.   Electronically Signed   By: Roanna RaiderJeffery  Chang M.D.   On: 05/21/2014 01:11     Yolande Jollyaleb G Tydus Sanmiguel, MD 05/22/2014, 12:30 PM PGY-1, [facility]  [place] Intern pager: [phone], text pages welcome

## 2014-05-23 LAB — BASIC METABOLIC PANEL
Anion gap: 6 (ref 5–15)
BUN: <5 mg/dL — ABNORMAL LOW (ref 6–20)
CALCIUM: 8.5 mg/dL — AB (ref 8.9–10.3)
CO2: 25 mmol/L (ref 22–32)
CREATININE: 0.78 mg/dL (ref 0.44–1.00)
Chloride: 108 mmol/L (ref 101–111)
GFR calc Af Amer: >60 mL/min (ref >60)
GFR calc non Af Amer: >60 mL/min (ref >60)
GLUCOSE: 113 mg/dL — AB (ref 65–99)
Potassium: 3.4 mmol/L — ABNORMAL LOW (ref 3.5–5.1)
Sodium: 139 mmol/L (ref 135–145)

## 2014-05-23 LAB — CBC
HEMATOCRIT: 34 % — AB (ref 36.0–46.0)
Hemoglobin: 11.7 g/dL — ABNORMAL LOW (ref 12.0–15.0)
MCH: 31.5 pg (ref 26.0–34.0)
MCHC: 34.4 g/dL (ref 30.0–36.0)
MCV: 91.4 fL (ref 78.0–100.0)
Platelets: 235 10*3/uL (ref 150–400)
RBC: 3.72 MIL/uL — ABNORMAL LOW (ref 3.87–5.11)
RDW: 12.5 % (ref 11.5–15.5)
WBC: 5 10*3/uL (ref 4.0–10.5)

## 2014-05-23 NOTE — Progress Notes (Signed)
Internal Medicine Clinic Attending  Case discussed with Dr. Truong at the time of the visit.  We reviewed the resident's history and exam and pertinent patient test results.  I agree with the assessment, diagnosis, and plan of care documented in the resident's note.  

## 2014-05-23 NOTE — Progress Notes (Signed)
  Subjective: Abdominal pain resolved, passing gas, small liquid BM last night  Objective: Vital signs in last 24 hours: Temp:  [98.6 F (37 C)-99 F (37.2 C)] 98.7 F (37.1 C) (05/19 0628) Pulse Rate:  [61-68] 68 (05/19 0628) Resp:  [16] 16 (05/18 1400) BP: (115-121)/(58-71) 121/58 mmHg (05/19 0628) SpO2:  [93 %-98 %] 98 % (05/19 0628) Last BM Date: 05/21/14  Intake/Output from previous day: 05/18 0701 - 05/19 0700 In: 1080 [P.O.:1080] Out: -  Intake/Output this shift:    General appearance: alert and cooperative Resp: clear to auscultation bilaterally GI: softer and less distended, +BS, nontender  Lab Results:   Recent Labs  05/22/14 0838 05/23/14 0620  WBC 5.6 5.0  HGB 10.8* 11.7*  HCT 32.0* 34.0*  PLT 220 235   BMET  Recent Labs  05/22/14 0838 05/23/14 0620  NA 140 139  K 3.5 3.4*  CL 110 108  CO2 24 25  GLUCOSE 105* 113*  BUN 9 <5*  CREATININE 0.73 0.78  CALCIUM 8.4* 8.5*   PT/INR No results for input(s): LABPROT, INR in the last 72 hours. ABG No results for input(s): PHART, HCO3 in the last 72 hours.  Invalid input(s): PCO2, PO2  Studies/Results: No results found.  Anti-infectives: Anti-infectives    None      Assessment/Plan: SBO -- Improving. Will advance to full liquids  LOS: 2 days    Brandi Mendez E 05/23/2014

## 2014-05-23 NOTE — Progress Notes (Addendum)
Family Medicine Teaching Service Daily Progress Note Intern Pager: [phone]  Patient name: Brandi Mendez Medical record number: [id number] Date of birth: 1956/01/04 Age: 59 y.o. Gender: female  Primary Care Provider: Kevin FentonBradshaw, Samuel, MD Consultants: surgery Code Status: FULL  Pt Overview and Major Events to Date:   Assessment and Plan: Brandi Mendez is a 59 y.o. female presenting with abdominal pain and SBO. PMH is significant for hx of SBO, abdominal hysterectomy, hernia repair, cholecystectomy.   # SBO: Improving. Patient tolerated liquid diet well.  Vitals stable. KUB shows distended small bowel with air-fluid levels, suspicious for SBO (bowel diameter 3.7cm max). - Surgery following, appreciate recs  - No NGT in place as patient has been refusing.  - morphine dose reduced to avoid motility impairment.  - Continue to follow clinical improvement and tolerance of diet.  - Continued on D5 1/4 NS pending further diet advancement.   - patient asking for solids, would consider advancing to at least thick liquids today. - UOB as tolerated  # AKI: Improved. Cr 1.1 to 0.78 today.   - likely dehydration from bowel rest  # Hypokalemia: Resolved - s/p potassium 10meq IV in ED  # Sinus Bradycardia - Normal rate this am.  Her pulse is baseline between 60-80. TSH is normal. Likely vagally mediated given ongoing SBO.  - Continue to monitor on tele.  - Further workup as indicated by symptoms.   FEN/GI: Clear liquid diet, D5, 1/4NS 75cc/hr.  Prophylaxis: heparin sq  Disposition: observation. Expect that can discharge later this afternoon vs tomorrow am if tolerating PO well.  Subjective:  Patient reports that she feels better today and that abdominal pain has improved.  She reports that she tolerated liquids without difficulty and is asking to advance to solids.  She reports a small BM yesterday and is passing flatus.  She denies nausea, vomiting.  Objective: Temp:  [98.6 F (37 C)-99 F  (37.2 C)] 98.7 F (37.1 C) (05/19 0628) Pulse Rate:  [61-68] 68 (05/19 0628) Resp:  [16] 16 (05/18 1400) BP: (115-121)/(58-71) 121/58 mmHg (05/19 0628) SpO2:  [93 %-98 %] 98 % (05/19 53660628) Physical Exam: General: well appearing female, resting in bed, NAD Cardiovascular: RRR, No MGR, Normal S1/S2, No heaves, no thrills Respiratory: CTAB, no increased WOB. No crackles, no rales.  Abdomen: S, ND, normoactive bowel sounds, Mild generalized TTP, but much improved from previous exam. No peritoneal signs. No organomegaly.  Extremities: WWP, 2+ distal pulses, No edema.   Neuro: AAOx3,  no focal deficits, follows commands Psych: Appropriate affect, speech normal.   Laboratory:  Recent Labs Lab 05/20/14 2147 05/20/14 2205 05/22/14 0838 05/23/14 0620  WBC 8.8  --  5.6 5.0  HGB 13.4 14.6 10.8* 11.7*  HCT 39.9 43.0 32.0* 34.0*  PLT 286  --  220 235    Recent Labs Lab 05/21/14 1130 05/22/14 0838 05/23/14 0620  NA 144 140 139  K 3.4* 3.5 3.4*  CL 112* 110 108  CO2 26 24 25   BUN 15 9 <5*  CREATININE 0.80 0.73 0.78  CALCIUM 8.5* 8.4* 8.5*  GLUCOSE 84 105* 113*    Results for orders placed or performed during the hospital encounter of 05/20/14 (from the past 24 hour(s))  Basic metabolic panel     Status: Abnormal   Collection Time: 05/22/14  8:38 AM  Result Value Ref Range   Sodium 140 135 - 145 mmol/L   Potassium 3.5 3.5 - 5.1 mmol/L   Chloride 110 101 -  111 mmol/L   CO2 24 22 - 32 mmol/L   Glucose, Bld 105 (H) 65 - 99 mg/dL   BUN 9 6 - 20 mg/dL   Creatinine, Ser 1.610.73 0.44 - 1.00 mg/dL   Calcium 8.4 (L) 8.9 - 10.3 mg/dL   GFR calc non Af Amer >60 >60 mL/min   GFR calc Af Amer >60 >60 mL/min   Anion gap 6 5 - 15  CBC     Status: Abnormal   Collection Time: 05/22/14  8:38 AM  Result Value Ref Range   WBC 5.6 4.0 - 10.5 K/uL   RBC 3.47 (L) 3.87 - 5.11 MIL/uL   Hemoglobin 10.8 (L) 12.0 - 15.0 g/dL   HCT 09.632.0 (L) 04.536.0 - 40.946.0 %   MCV 92.2 78.0 - 100.0 fL   MCH 31.1 26.0  - 34.0 pg   MCHC 33.8 30.0 - 36.0 g/dL   RDW 81.112.7 91.411.5 - 78.215.5 %   Platelets 220 150 - 400 K/uL  CBC     Status: Abnormal   Collection Time: 05/23/14  6:20 AM  Result Value Ref Range   WBC 5.0 4.0 - 10.5 K/uL   RBC 3.72 (L) 3.87 - 5.11 MIL/uL   Hemoglobin 11.7 (L) 12.0 - 15.0 g/dL   HCT 95.634.0 (L) 21.336.0 - 08.646.0 %   MCV 91.4 78.0 - 100.0 fL   MCH 31.5 26.0 - 34.0 pg   MCHC 34.4 30.0 - 36.0 g/dL   RDW 57.812.5 46.911.5 - 62.915.5 %   Platelets 235 150 - 400 K/uL  Basic metabolic panel     Status: Abnormal   Collection Time: 05/23/14  6:20 AM  Result Value Ref Range   Sodium 139 135 - 145 mmol/L   Potassium 3.4 (L) 3.5 - 5.1 mmol/L   Chloride 108 101 - 111 mmol/L   CO2 25 22 - 32 mmol/L   Glucose, Bld 113 (H) 65 - 99 mg/dL   BUN <5 (L) 6 - 20 mg/dL   Creatinine, Ser 5.280.78 0.44 - 1.00 mg/dL   Calcium 8.5 (L) 8.9 - 10.3 mg/dL   GFR calc non Af Amer >60 >60 mL/min   GFR calc Af Amer >60 >60 mL/min   Anion gap 6 5 - 15   Imaging/Diagnostic Tests: No results found.   Raliegh IpAshly M Gottschalk, DO 05/23/2014, 8:21 AM PGY-1, [facility] Intern pager: [phone], text pages welcome

## 2014-05-23 NOTE — Discharge Summary (Signed)
Family Medicine Teaching [facility] Discharge Summary  Patient name: Brandi Mendez Medical record number: [id number] Date of birth: [birth year] Age: 59 y.o. Gender: female Date of Admission: 05/20/2014  Date of Discharge: 05/24/14 Admitting Physician: Nestor RampSara L Neal, MD  Primary Care Provider: Kevin FentonBradshaw, Samuel, MD Consultants: general surgery  Indication for Hospitalization: SBO  Discharge Diagnoses/Problem List:  Small bowel obstruction Acute kidney injury  Disposition: Discharge Home  Discharge Condition: Stable/Improved  Discharge Exam:  Temp: [98.3 F (36.8 C)-98.9 F (37.2 C)] 98.9 F (37.2 C) (05/20 0522) Pulse Rate: [50-57] 55 (05/20 0522) Resp: [17-18] 17 (05/20 0522) BP: (104-121)/(51-89) 104/51 mmHg (05/20 0522) SpO2: [97 %-100 %] 97 % (05/20 0522) Physical Exam: General: well appearing female, resting in bed, NAD Cardiovascular: bradycardic, No MGR, Normal S1/S2, No heaves, no thrills Respiratory: CTAB, no increased WOB. No crackles, no rales.  Abdomen: S, ND, normoactive bowel sounds, NT. No peritoneal signs. No organomegaly.  Extremities: WWP, 2+ distal pulses, No edema.  Skin: no rashes; dry and inact Neuro: AAOx3, no focal deficits, follows commands Psych: Appropriate affect, speech normal.   Brief Hospital Course:  Brandi MusselLee N Dorko is a 59 y.o. female that presented with abdominal pain and found to have SBO. PMH is significant for hx of SBO, abdominal hysterectomy, hernia repair, cholecystectomy.  In the ED, abdominal xray revealed a partial small bowel obstruction.  Labs were significant for an AKI with Cr 1.10 & K 3.2. This was thought to be 2/2 dehydration from SBO.  She was also noted be bradycardic to 50-60's.  This was again thought to be 2/2 vasovagal effects of SBO.  [facility]  She was made NPO and hydrated with IVF.  General surgery was consulted, who also recommended continued bowel rest.  Patient's diet was  advanced slowly.  Her intake and output was monitored.  Her electrolytes were monitored daily and replenished PRN.  She was encouraged to ambulate and medications that decreased bowel motility were avoided.  She was discharged in stable condition, tolerating PO well and voiding/defecating normally.  Return precautions were reviewed and she was scheduled for close follow up with her PCP.  Issues for Follow Up:  1. Bradycardia  Significant Procedures: n/a  Significant Labs and Imaging:   Recent Labs Lab 05/20/14 2147 05/20/14 2205 05/22/14 0838 05/23/14 0620  WBC 8.8  --  5.6 5.0  HGB 13.4 14.6 10.8* 11.7*  HCT 39.9 43.0 32.0* 34.0*  PLT 286  --  220 235    Recent Labs Lab 05/20/14 2205 05/21/14 1130 05/22/14 0838 05/23/14 0620  NA 142 144 140 139  K 3.2* 3.4* 3.5 3.4*  CL 101 112* 110 108  CO2  --  26 24 25   GLUCOSE 111* 84 105* 113*  BUN 22* 15 9 <5*  CREATININE 1.10* 0.80 0.73 0.78  CALCIUM  --  8.5* 8.4* 8.5*    Dg Abd Acute W/chest  05/21/2014   CLINICAL DATA:  Acute onset of mid abdominal pain and nausea. Initial encounter.  EXAM: DG ABDOMEN ACUTE W/ 1V CHEST  COMPARISON:  Chest and abdominal radiographs performed 05/19/2014  FINDINGS: The lungs are well-aerated and clear. There is no evidence of focal opacification, pleural effusion or pneumothorax. The cardiomediastinal silhouette is within normal limits.  There is distention of small-bowel loops to 3.7 cm in diameter, with associated air-fluid levels. This raises concern for partial small bowel obstruction or small bowel dysmotility. Residual air and stool are seen within the  colon. No free intra-abdominal air is identified on the provided upright view.  No acute osseous abnormalities are seen; the sacroiliac joints are unremarkable in appearance. Clips are noted within the right upper quadrant, reflecting prior cholecystectomy. An anterior abdominal wall mesh is noted overlying the lower abdomen. Clips are seen overlying  the mid pelvis.  IMPRESSION: 1. Distention of small-bowel loops to 3.7 cm in maximal diameter, with associated air-fluid levels. This raises concern for partial small bowel obstruction or small bowel dysmotility. Residual air and stool seen within the colon. No free intra-abdominal air seen. 2. No acute cardiopulmonary process seen.   Electronically Signed   By: Roanna RaiderJeffery  Chang M.D.   On: 05/21/2014 01:11    Results/Tests Pending at Time of Discharge: none  Discharge Medications:    Medication List    STOP taking these medications        ibuprofen 800 MG tablet  Commonly known as:  ADVIL,MOTRIN      TAKE these medications        nitroGLYCERIN 0.4 MG SL tablet  Commonly known as:  NITROSTAT  Place 1 tablet (0.4 mg total) under the tongue every 5 (five) minutes as needed for chest pain.     promethazine 25 MG tablet  Commonly known as:  PHENERGAN  Take 1 tablet (25 mg total) by mouth every 6 (six) hours as needed for nausea or vomiting.     sucralfate 1 G tablet  Commonly known as:  CARAFATE  Take 1 tablet (1 g total) by mouth 4 (four) times daily -  with meals and at bedtime.        Discharge Instructions: Please refer to Patient Instructions section of EMR for full details.  Patient was counseled important signs and symptoms that should prompt return to medical care, changes in medications, dietary instructions, activity restrictions, and follow up appointments.   Follow-Up Appointments: Follow-up Information    Follow up with Kevin FentonBradshaw, Samuel, MD. Go on 05/31/2014.   Specialty:  Family Medicine   Why:  9:45am (hospital follow up)   Contact information:   [address] [place] [address] [phone]       Raliegh Ipshly M Tereso Unangst, DO 05/24/2014, 8:45 AM PGY-1, [facility]

## 2014-05-23 NOTE — Discharge Instructions (Signed)
You were admitted for a partial small bowel obstruction.  You were carefully monitored and seen by surgery.  You should continue to be active, as this decreases the risk of recurrence.  You have an appointment scheduled for hospital follow up with Dr Ermalinda MemosBradshaw next Friday at 9:45am.  Please make sure to go to this appointment.   Small Bowel Obstruction A small bowel obstruction means something is blocking the small intestine. The small intestine is the long tube that connects the stomach to the colon. Treatment depends on what is causing the problem. Treatment also depends on how bad the problem is. HOME CARE Your doctor may let you go home if your small bowel is not completely blocked.  Rest.  Follow your diet as told by your doctor.  Only drink clear liquids until you start to get better.  Avoid solid foods as told by your doctor.  Only take medicine as told by your doctor. GET HELP RIGHT AWAY IF:  You have pain or cramps that get worse.  You throw up (vomit) blood.  You feel sick to your stomach (nauseous), or you cannot stop throwing up.  You cannot drink fluids.  You feel confused.  You feel dry or thirsty (dehydrated).  Your belly gets more puffy (bloated).  You have chills.  You have a fever.  You feel weak, or you pass out (faint). MAKE SURE YOU:  Understand these instructions.  Will watch your condition.  Will get help right away if you are not doing well or get worse. Document Released: 01/29/2004 Document Revised: 03/15/2011 Document Reviewed: 03/31/2010 [facility] Patient Information 2015 [employer] [place]. This information is not intended to replace advice given to you by your health care provider. Make sure you discuss any questions you have with your health care provider.

## 2014-05-24 LAB — BASIC METABOLIC PANEL
ANION GAP: 7 (ref 5–15)
BUN: <5 mg/dL — ABNORMAL LOW (ref 6–20)
CO2: 28 mmol/L (ref 22–32)
Calcium: 9.4 mg/dL (ref 8.9–10.3)
Chloride: 103 mmol/L (ref 101–111)
Creatinine, Ser: 0.72 mg/dL (ref 0.44–1.00)
GFR calc Af Amer: >60 mL/min (ref >60)
Glucose, Bld: 134 mg/dL — ABNORMAL HIGH (ref 65–99)
Potassium: 3.7 mmol/L (ref 3.5–5.1)
SODIUM: 138 mmol/L (ref 135–145)

## 2014-05-24 NOTE — Progress Notes (Signed)
Gershon MusselLee N Torian discharged home per MD order. Discharge instructions reviewed and discussed with patient. All questions and concerns answered. Copy of instructions and scripts given to patient. IV removed.  Patient escorted to car by staff in a wheelchair. No distress noted upon discharge.   Lorin PicketScott, [place] R 05/24/2014 11:15 AM

## 2014-05-24 NOTE — Progress Notes (Signed)
Family Medicine Teaching Service Daily Progress Note Intern Pager: [phone]  Patient name: Brandi Mendez Medical record number: [id number] Date of birth: Apr 28, 1955 Age: 59 y.o. Gender: female  Primary Care Provider: Kevin FentonBradshaw, Samuel, MD Consultants: surgery Code Status: FULL  Pt Overview and Major Events to Date:   Assessment and Plan: Brandi Mendez is a 59 y.o. female presenting with abdominal pain and SBO. PMH is significant for hx of SBO, abdominal hysterectomy, hernia repair, cholecystectomy.   # SBO: Improving. Patient tolerated liquid diet well.  Vitals stable. KUB shows distended small bowel with air-fluid levels, suspicious for SBO (bowel diameter 3.7cm max). Patient advanced to reg diet yesterday and tolerated this WELL. - Surgery following, appreciate recs  - morphine dose reduced to avoid motility impairment.  - D5 1/4 NS discontinued in setting of PO tolerance - UOB as tolerated  # AKI: Improved. Cr 1.1 on admission.  BMET hemolyzed this am.  Repeat pending.  - likely dehydration from bowel rest  # Hypokalemia: Resolved  # Sinus Bradycardia - HR 55 this am.  Her pulse is baseline between 60-80. TSH is normal. Likely vagally mediated given ongoing SBO.  - Continue to monitor on tele.  - Further workup as indicated by symptoms.   FEN/GI: Reg diet, SLIV Prophylaxis: heparin sq  Disposition:  Expect that can discharge today pending final evaluation by surgery  Subjective:  Patient reports that she feels well today.  She reports that she tolerated a full dinner consisting of porkchops and baked potato last night.  She denies nausea, vomiting, abdominal pain.  She is passing flatus and had 3 normal BMs yesterday.  She reports that she ambulated up and down the halls yesterday.  She feels well to go home.  Objective: Temp:  [98.3 F (36.8 C)-98.9 F (37.2 C)] 98.9 F (37.2 C) (05/20 0522) Pulse Rate:  [50-57] 55 (05/20 0522) Resp:  [17-18] 17 (05/20 0522) BP:  (104-121)/(51-89) 104/51 mmHg (05/20 0522) SpO2:  [97 %-100 %] 97 % (05/20 0522) Physical Exam: General: well appearing female, resting in bed, NAD Cardiovascular: bradycardic, No MGR, Normal S1/S2, No heaves, no thrills Respiratory: CTAB, no increased WOB. No crackles, no rales.  Abdomen: S, ND, normoactive bowel sounds, NT. No peritoneal signs. No organomegaly.  Extremities: WWP, 2+ distal pulses, No edema.   Neuro: AAOx3,  no focal deficits, follows commands Psych: Appropriate affect, speech normal.   Laboratory:  Recent Labs Lab 05/20/14 2147 05/20/14 2205 05/22/14 0838 05/23/14 0620  WBC 8.8  --  5.6 5.0  HGB 13.4 14.6 10.8* 11.7*  HCT 39.9 43.0 32.0* 34.0*  PLT 286  --  220 235    Recent Labs Lab 05/21/14 1130 05/22/14 0838 05/23/14 0620  NA 144 140 139  K 3.4* 3.5 3.4*  CL 112* 110 108  CO2 26 24 25   BUN 15 9 <5*  CREATININE 0.80 0.73 0.78  CALCIUM 8.5* 8.4* 8.5*  GLUCOSE 84 105* 113*    No results found for this or any previous visit (from the past 24 hour(s)). Imaging/Diagnostic Tests: No results found.   Raliegh IpAshly M Gottschalk, DO 05/24/2014, 7:12 AM PGY-1, [facility] FPTS Intern pager: [phone], text pages welcome

## 2014-05-24 NOTE — Progress Notes (Signed)
Patient ID: Brandi Mendez, female   DOB: [birth year], 59 y.o.   MRN: [id number]     [address] [place], [place] [address]    Phone: [phone] FAX: [phone]     Subjective: BMs yesterday, flatus today.  Tolerated a regular diet.  VSS.  Afebrile.  No pain, no/v  Objective:  Vital signs:  Filed Vitals:   05/23/14 0628 05/23/14 1400 05/23/14 2035 05/24/14 0522  BP: 121/58 108/89 121/59 104/51  Pulse: 68 57 50 55  Temp: 98.7 F (37.1 C) 98.3 F (36.8 C) 98.8 F (37.1 C) 98.9 F (37.2 C)  TempSrc: Oral Oral Oral Oral  Resp:  _0 Weight:      SpO2: 98% 100% 99% 97%    Last BM Date: 05/21/14  Intake/Output   Yesterday:  05/19 0701 - 05/20 0700 In: 1080 [P.O.:1080] Out: -  This shift:    I/O last 3 completed shifts: In: 1080 [P.O.:1080] Out: -     Physical Exam: General: Pt awake/alert/oriented x4 in no  acute distress  Abdomen: Soft.  Nondistended.  Non tender.  No evidence of peritonitis.  No incarcerated hernias.    Problem List:   Active Problems:   SBO (small bowel obstruction)   AKI (acute kidney injury)   Small bowel obstruction    Results:   Labs: Results for orders placed or performed during the hospital encounter of 05/20/14 (from the past 48 hour(s))  Basic metabolic panel     Status: Abnormal   Collection Time: 05/22/14  8:38 AM  Result Value Ref Range   Sodium 140 135 - 145 mmol/L   Potassium 3.5 3.5 - 5.1 mmol/L   Chloride 110 101 - 111 mmol/L   CO2 24 22 - 32 mmol/L   Glucose, Bld 105 (H) 65 - 99 mg/dL   BUN 9 6 - 20 mg/dL   Creatinine, Ser 0.73 0.44 - 1.00 mg/dL   Calcium 8.4 (L) 8.9 - 10.3 mg/dL   GFR calc non Af Amer >60 >60 mL/min   GFR calc Af Amer >60 >60 mL/min    Comment: (NOTE) The eGFR has been calculated using the CKD EPI equation. This calculation has not been validated in all clinical situations. eGFR's persistently <60 mL/min signify possible  Chronic Kidney Disease.    Anion gap 6 5 - 15  CBC     Status: Abnormal   Collection Time: 05/22/14  8:38 AM  Result Value Ref Range   WBC 5.6 4.0 - 10.5 K/uL   RBC 3.47 (L) 3.87 - 5.11 MIL/uL   Hemoglobin 10.8 (L) 12.0 - 15.0 g/dL    Comment: REPEATED TO VERIFY   HCT 32.0 (L) 36.0 - 46.0 %   MCV 92.2 78.0 - 100.0 fL   MCH 31.1 26.0 - 34.0 pg   MCHC 33.8 30.0 - 36.0 g/dL   RDW 12.7 11.5 - 15.5 %   Platelets 220 150 - 400 K/uL  CBC     Status: Abnormal   Collection Time: 05/23/14  6:20 AM  Result Value Ref Range   WBC 5.0 4.0 - 10.5 K/uL   RBC 3.72 (L) 3.87 - 5.11 MIL/uL   Hemoglobin 11.7 (L) 12.0 - 15.0 g/dL   HCT 34.0 (L) 36.0 - 46.0 %   MCV 91.4 78.0 - 100.0 fL   MCH 31.5 26.0 - 34.0 pg   MCHC 34.4 30.0 - 36.0 g/dL  RDW 12.5 11.5 - 15.5 %   Platelets 235 150 - 400 K/uL  Basic metabolic panel     Status: Abnormal   Collection Time: 05/23/14  6:20 AM  Result Value Ref Range   Sodium 139 135 - 145 mmol/L   Potassium 3.4 (L) 3.5 - 5.1 mmol/L   Chloride 108 101 - 111 mmol/L   CO2 25 22 - 32 mmol/L   Glucose, Bld 113 (H) 65 - 99 mg/dL   BUN <5 (L) 6 - 20 mg/dL   Creatinine, Ser 0.78 0.44 - 1.00 mg/dL   Calcium 8.5 (L) 8.9 - 10.3 mg/dL   GFR calc non Af Amer >60 >60 mL/min   GFR calc Af Amer >60 >60 mL/min    Comment: (NOTE) The eGFR has been calculated using the CKD EPI equation. This calculation has not been validated in all clinical situations. eGFR's persistently <60 mL/min signify possible Chronic Kidney Disease.    Anion gap 6 5 - 15    Imaging / Studies: No results found.  Medications / Allergies:  Scheduled Meds: . heparin  5,000 Units Subcutaneous 3 times per day   Continuous Infusions:  PRN Meds:.morphine injection, ondansetron **OR** ondansetron (ZOFRAN) IV, traMADol  Antibiotics: Anti-infectives    None        Assessment/Plan SBO-resolved.  Stable for DC from surgical standpoint.   Erby Pian, [facility] Surgery Pager  [phone]) For consults and floor pages call [phone](7A-4:30P)  05/24/2014 8:22 AM

## 2014-05-31 ENCOUNTER — Inpatient Hospital Stay: Payer: Self-pay | Admitting: Family Medicine

## 2014-06-07 ENCOUNTER — Inpatient Hospital Stay: Payer: Self-pay | Admitting: Family Medicine

## 2014-06-11 ENCOUNTER — Encounter: Payer: Self-pay | Admitting: Family Medicine

## 2014-06-11 ENCOUNTER — Ambulatory Visit (INDEPENDENT_AMBULATORY_CARE_PROVIDER_SITE_OTHER): Payer: Self-pay | Admitting: Family Medicine

## 2014-06-11 VITALS — BP 128/73 | HR 80 | Temp 97.9°F | Ht 64.0 in | Wt 175.0 lb

## 2014-06-11 DIAGNOSIS — R1013 Epigastric pain: Secondary | ICD-10-CM

## 2014-06-11 DIAGNOSIS — K5901 Slow transit constipation: Secondary | ICD-10-CM

## 2014-06-11 MED ORDER — POLYETHYLENE GLYCOL 3350 17 GM/SCOOP PO POWD: 17.0000 g | Freq: Two times a day (BID) | ORAL | Status: DC | PRN

## 2014-06-11 MED ORDER — OMEPRAZOLE 20 MG PO CPDR: 20.0000 mg | DELAYED_RELEASE_CAPSULE | Freq: Every day | ORAL | Status: DC

## 2014-06-11 NOTE — Assessment & Plan Note (Signed)
A: Vaguely-described symptoms consistent with reflux on top of IBS-type constipation. Has had negative H.pylori in the past few months. Per chart review, has been on Pepcid without relief and was unable to get Carafate as prescribed by Dr. Beryle FlockBacigalupo in March. Referred to GI at that time but unable to get to the appointment and only has the orange card. No red-flag symptoms.  P: Rx for omeprazole with instructions to try daily for several weeks, then f/u with PCP Dr. Ermalinda MemosBradshaw (or new PCP after July 1st). Could consider re-referral to GI ([facility], for [facility] / financial assistance through them), but transportation may be an issue. Regardless, f/u as needed otherwise.

## 2014-06-11 NOTE — Patient Instructions (Signed)
Thank you for coming in, today!  I think you have some heartburn and stomach pain from reflux. I want you to take omeprazole, a reflux medicine, once a day for the next several weeks.  I also want you to take MiraLAX (keep also taking the fiber). Start MiraLAX once per day and see if that helps you have one soft-formed bowel movement per day. If you need to, you can go up on MiraLAX to twice per day. If you have loose stool with it, go back to once a day or try every other day. You can adjust how you take it as needed based on your bowel movements.  Try these medicines for a couple of months. After that, or if you need to come back sooner, come back to see your regular doctor. Dr. Ermalinda MemosBradshaw is your regular doctor now, but he leaves after June 30th.  Please feel free to call with any questions or concerns at any time, at [phone]. --Dr. Casper HarrisonStreet

## 2014-06-11 NOTE — Assessment & Plan Note (Addendum)
A: Recent SBO with some persistent mild abdominal pain and reflux-type symptoms, but only having BM's every 2-3 days. Currently using fiber supplement with some help. On ROS and chart review, has had IBS-type symptoms with occasional constipation and occasional diarrhea, with blood in stool in the past (felt by Dr. Ermalinda MemosBradshaw to be fissure or internal hemorrhoid) but none currently. Has used MiraLAX on-and-off in the past, and apparently has been on reflux-related medication in the past, as well. Labs from prior to discharge stable (Hb not dropping, AKI improved).  P: Continue fiber supplement. New Rx given for MiraLAX with instructions on intended use -- at least once per day for the time being, in order to get to goal of one soft-formed BM daily, then either titrate up to BID if needed or down to every other day or so, depending on BM's. Given IBS-type symptoms, GI referral is not unreasonable, but pt only has the orange card and apparently was both unable to get to appt in March and was unable to be contacted to reschedule. Would trial on MiraLAX and consider re-referral to GI (on chart review, Dr. Ermalinda MemosBradshaw has apparently given her information on seeing [facility] in the past). F/u with PCP as needed.  See also dyspepsia problem list note.

## 2014-06-11 NOTE — Progress Notes (Signed)
   Subjective:    Patient ID: Brandi Mendez, female    DOB: 16-Dec-1955, 59 y.o.   MRN: [id number]  HPI: Pt presents to clinic for hospital follow-up for SOB and AKI. She was admitted 5/16 to 5/20 and improved with IVF and bowel rest; she did not require an NG tube but has had SBO's in the past that did require NG tubes. Her SBO was felt to be due to bowel adhesions given hx of multiple surgeries and similar SBO's in the past.   Overall, her pain is improved and she has no N/V other than occasional nausea. She is having bowel movements about every other day, non-pain and not hard. She is taking a fiber supplement, which she thinks is helping. Her appetite is "okay" and she reports no problems with eating.  Of note, pt has sucralfate on her med list but has not been taking it (was prescribed per EP[employer] back in March but not carried by the health department pharmacy, and they did not have a good alternative). She has some complaints of alternating diarrhea / constipation, which has been present for some time but she is unsure if she has been told she has irritable bowel or not. She has never cseen a GI doctor; she was referred back in March but was not able to get there and missed the appointment. There has been some difficulty (per notes) contacting pt by phone but her phone number in EPIC was verified, today.  Review of Systems: As above. Does report some occasional "sour belching" and epigastric tenderness but no vomiting, hematemesis or hematochezia.    Objective:   Physical Exam BP 128/73 mmHg  Pulse 80  Temp(Src) 97.9 F (36.6 C) (Oral)  Ht 5\' 4"  (1.626 m)  Wt 175 lb (79.379 kg)  BMI 30.02 kg/m2 Gen: well-appearing adult female in NAD HEENT: [place]/AT, EOMI, PERRLA, MMM  Nasal mucosae and posterior oropharynx clear Neck: supple, normal ROM, no lymphadenopathy Cardio: RRR, no murmur appreciated Pulm: CTAB, no wheezes, normal WOB Abd: soft, diffuse mild tenderness mostly in epigastrium and  periumbilical region, BS+ Ext: warm, well-perfused, no LE edema     Assessment & Plan:  SBO apparently resolved. AKI resolved prior to discharge from hospital, so labs not repeated today. Current abdominal pain and dyspepsia felt to be constipation (?IBS-type issues) and reflux-related. See problem list notes.

## 2014-06-13 ENCOUNTER — Other Ambulatory Visit: Payer: Self-pay | Admitting: Internal Medicine

## 2014-06-13 NOTE — Telephone Encounter (Signed)
Called the pharmacy, script was on hold, it was filled and she didn't pick it up so it was put back and put on hold, they are refilling and will call her

## 2014-06-17 ENCOUNTER — Telehealth: Payer: Self-pay | Admitting: *Deleted

## 2014-06-17 NOTE — Telephone Encounter (Signed)
Pt states she wants metformin at [facility] today, metformin called to [facility], pt then stated she could not pick up til Thursday when she has money, she is ask to speak with [facility] about their hold shelf time, it may 7 days. Appointment also made for check up 8/3

## 2014-07-02 ENCOUNTER — Emergency Department (HOSPITAL_COMMUNITY)
Admission: EM | Admit: 2014-07-02 | Discharge: 2014-07-02 | Disposition: A | Discharge status: Home / Self Care | Payer: Medicaid Other | Attending: Emergency Medicine | Admitting: Emergency Medicine

## 2014-07-02 ENCOUNTER — Encounter (HOSPITAL_COMMUNITY): Payer: Self-pay | Admitting: Emergency Medicine

## 2014-07-02 DIAGNOSIS — Y9389 Activity, other specified: Secondary | ICD-10-CM | POA: Diagnosis not present

## 2014-07-02 DIAGNOSIS — I10 Essential (primary) hypertension: Secondary | ICD-10-CM | POA: Insufficient documentation

## 2014-07-02 DIAGNOSIS — Z8742 Personal history of other diseases of the female genital tract: Secondary | ICD-10-CM | POA: Insufficient documentation

## 2014-07-02 DIAGNOSIS — X58XXXA Exposure to other specified factors, initial encounter: Secondary | ICD-10-CM | POA: Diagnosis not present

## 2014-07-02 DIAGNOSIS — Q210 Ventricular septal defect: Secondary | ICD-10-CM | POA: Diagnosis not present

## 2014-07-02 DIAGNOSIS — H9190 Unspecified hearing loss, unspecified ear: Secondary | ICD-10-CM | POA: Insufficient documentation

## 2014-07-02 DIAGNOSIS — Z7982 Long term (current) use of aspirin: Secondary | ICD-10-CM | POA: Insufficient documentation

## 2014-07-02 DIAGNOSIS — Y998 Other external cause status: Secondary | ICD-10-CM | POA: Insufficient documentation

## 2014-07-02 DIAGNOSIS — T162XXA Foreign body in left ear, initial encounter: Secondary | ICD-10-CM | POA: Insufficient documentation

## 2014-07-02 DIAGNOSIS — Z880 Allergy status to penicillin: Secondary | ICD-10-CM | POA: Insufficient documentation

## 2014-07-02 DIAGNOSIS — Z79899 Other long term (current) drug therapy: Secondary | ICD-10-CM | POA: Insufficient documentation

## 2014-07-02 DIAGNOSIS — Y929 Unspecified place or not applicable: Secondary | ICD-10-CM | POA: Insufficient documentation

## 2014-07-02 DIAGNOSIS — E119 Type 2 diabetes mellitus without complications: Secondary | ICD-10-CM | POA: Diagnosis not present

## 2014-07-02 NOTE — ED Notes (Addendum)
Pt c/o left ear pain x's 3 hours.  Pt st's she was cleaning ear and cotton got stuck in her ear.  Pt is deaf but can read lips

## 2014-07-02 NOTE — Discharge Instructions (Signed)
Do not use cotton swabs in your ears. Use tylenol/motrin as needed for pain, or heat as needed for additional relief. Follow up with your regular doctor in 3-5 days for recheck. Return to the ER for changes or worsening symptoms.   Ear Foreign Body An ear foreign body is an object that is stuck in the ear. It is common for young children to put objects into the ear canal. These may include pebbles, beads, beans, and any other small objects which will fit. In adults, objects such as cotton swabs may become lodged in the ear canal. In all ages, the most common foreign bodies are insects that enter the ear canal.  SYMPTOMS  Foreign bodies may cause pain, buzzing or roaring sounds, hearing loss, and ear drainage.  HOME CARE INSTRUCTIONS   Keep all follow-up appointments with your caregiver as told.  Keep small objects out of reach of young children. Tell them not to put anything in their ears. SEEK IMMEDIATE MEDICAL CARE IF:   You have bleeding from the ear.  You have increased pain or swelling of the ear.  You have reduced hearing.  You have discharge coming from the ear.  You have a fever.  You have a headache. MAKE SURE YOU:   Understand these instructions.  Will watch your condition.  Will get help right away if you are not doing well or get worse. Document Released: 12/19/1999 Document Revised: 03/15/2011 Document Reviewed: 08/09/2007 [facility] Patient Information 2015 [place], [place]. This information is not intended to replace advice given to you by your health care provider. Make sure you discuss any questions you have with your health care provider.

## 2014-07-02 NOTE — ED Notes (Signed)
Called pts sister Shanira Tine  At [phone] to pick pt up.

## 2014-07-02 NOTE — ED Provider Notes (Signed)
CSN: [id number]     Arrival date & time 07/02/14  2043 History  This chart was scribed for non-physician practitioner, Zacarias Pontes, PA-C working with Milton Ferguson, MD, by Chester Holstein, ED Scribe. This patient was seen in room TR04C/TR04C and the patient's care was started at 9:35 PM.      Chief Complaint  Patient presents with  . Otalgia      Patient is a 59 y.o. female presenting with ear pain. The history is provided by the patient. No language interpreter was used.  Otalgia Location:  Left Behind ear:  No abnormality Quality:  Throbbing Severity:  Severe Onset quality:  Sudden Duration:  3 hours Timing:  Constant Progression:  Unchanged Chronicity:  New Context: foreign body   Relieved by:  None tried Worsened by:  Nothing tried Ineffective treatments:  None tried Associated symptoms: hearing loss (baseline)   Associated symptoms: no abdominal pain, no congestion, no ear discharge, no fever, no neck pain, no rhinorrhea, no sore throat and no vomiting    HPI Comments: Janice Massey is a 59 y.o. female with a PMHx of DM2, HTN, muteness, and deafness, who presents to the Emergency Department complaining of constant 9/10 throbbing pain to left ear, nonradiating, onset 3 hours ago when she was cleaning her ear and the cotton from the swab became stuck in her ear, with no aggravating or alleviating factors. Pt is deaf but is able to read lips. Pt denies fever, chills, nausea, sore throat, rhinorrhea, vomiting, abdominal pain, chest pain, SOB, or any other symptoms.    Past Medical History  Diagnosis Date  . Diabetes mellitus   . Hypertension   . Back pain   . Chronic PID   . Ventricular septal defect 2004 per echo  . [place]   . Deaf    Past Surgical History  Procedure Laterality Date  . Cholecystectomy  11/04/1993  . Tubal ligation  10/05/1978  . Colposcopy     Family History  Problem Relation Age of Onset  . Cancer Mother   . Hypertension Father   .  Diabetes Father   . Diabetes Paternal Aunt   . Diabetes Maternal Grandfather    History  Substance Use Topics  . Smoking status: Never Smoker   . Smokeless tobacco: Never Used  . Alcohol Use: No   OB History    Gravida Para Term Preterm AB TAB SAB Ectopic Multiple Living   2 2 2       2      Review of Systems  Constitutional: Negative for fever and chills.  HENT: Positive for ear pain and hearing loss (baseline). Negative for congestion, ear discharge, rhinorrhea, sore throat and trouble swallowing.        Baseline deaf  Eyes: Negative for pain and discharge.  Respiratory: Negative for shortness of breath.   Cardiovascular: Negative for chest pain.  Gastrointestinal: Negative for nausea, vomiting and abdominal pain.  Musculoskeletal: Negative for neck pain.  Skin: Negative for color change.   A complete 10 system review of systems was obtained and all systems are negative except as noted in the HPI and PMH.     Allergies  Codeine; Lisinopril; and Penicillins  Home Medications   Prior to Admission medications   Medication Sig Start Date End Date Taking? Authorizing Provider  albuterol (PROVENTIL HFA;VENTOLIN HFA) 108 (90 BASE) MCG/ACT inhaler Inhale 1-2 puffs into the lungs every 6 (six) hours as needed for wheezing or shortness of breath. 08/08/13   Vaughan Basta  Damaris Hippo, NP  aspirin 81 MG tablet Take 81 mg by mouth every morning.    Historical Provider, MD  atorvastatin (LIPITOR) 80 MG tablet Take 1 tablet (80 mg total) by mouth daily. 04/03/14   Francesca Oman, DO  clotrimazole-betamethasone (LOTRISONE) cream Apply 1 application topically daily. 05/06/14   Richard Joelene Millin, DPM  cyclobenzaprine (FLEXERIL) 10 MG tablet Take 1 tablet (10 mg total) by mouth 3 (three) times daily as needed for muscle spasms. 05/21/14 05/21/15  Norman Herrlich, MD  hydrochlorothiazide (HYDRODIURIL) 25 MG tablet Take 1 tablet (25 mg total) by mouth daily. 05/08/14   Francesca Oman, DO  ibuprofen (ADVIL,MOTRIN)  800 MG tablet Take 1 tablet (800 mg total) by mouth 3 (three) times daily. 05/21/14   Norman Herrlich, MD  losartan (COZAAR) 50 MG tablet Take 1 tablet (50 mg total) by mouth daily. 05/08/14   Francesca Oman, DO  metFORMIN (GLUCOPHAGE) 1000 MG tablet Take 1 tablet (1,000 mg total) by mouth 2 (two) times daily. 05/08/14   Francesca Oman, DO  omeprazole (PRILOSEC) 20 MG capsule Take 1 capsule (20 mg total) by mouth daily. 05/08/14   Francesca Oman, DO  sucralfate (CARAFATE) 1 GM/10ML suspension Take 10 mLs (1 g total) by mouth 4 (four) times daily. 04/14/14   Carmin Muskrat, MD   BP 153/92 mmHg  Pulse 98  Temp(Src) 98.5 F (36.9 C) (Oral)  Resp 16  SpO2 99% Physical Exam  Constitutional: She is oriented to person, place, and time. Vital signs are normal. She appears well-developed and well-nourished.  Non-toxic appearance. No distress.  Afebrile, nontoxic, NAD  HENT:  Head: Normocephalic and atraumatic.  Right Ear: Tympanic membrane, external ear and ear canal normal.  Left Ear: Tympanic membrane and external ear normal. No drainage, swelling or tenderness. A foreign body (cotton swab) is present.  Mouth/Throat: Mucous membranes are normal.  R ear clear. L ear with cotton swab lodged in ext ear canal. Once removed, TM clear and ear canal nonerythematous without drainage or swelling. Pt with baseline hearing loss. Nose clear.   Eyes: Conjunctivae and EOM are normal. Right eye exhibits no discharge. Left eye exhibits no discharge.  Neck: Normal range of motion. Neck supple.  Cardiovascular: Normal rate.   Pulmonary/Chest: Effort normal. No respiratory distress.  Abdominal: Normal appearance. She exhibits no distension.  Musculoskeletal: Normal range of motion.  Lymphadenopathy:       Head (right side): No submandibular and no tonsillar adenopathy present.       Head (left side): No submandibular and no tonsillar adenopathy present.  Neurological: She is alert and oriented to person, place, and time.  She has normal strength. No sensory deficit.  Skin: Skin is warm, dry and intact. No rash noted.  Psychiatric: She has a normal mood and affect. Her behavior is normal.  Nursing note and vitals reviewed.   ED Course  FOREIGN BODY REMOVAL Date/Time: 07/02/2014 9:47 PM Performed by: Zacarias Pontes Authorized by: Zacarias Pontes Consent: Verbal consent obtained. Risks and benefits: risks, benefits and alternatives were discussed Consent given by: patient Patient understanding: patient states understanding of the procedure being performed Patient consent: the patient's understanding of the procedure matches consent given Patient identity confirmed: verbally with patient Body area: ear Location details: left ear Patient sedated: no Patient restrained: no Patient cooperative: yes Localization method: ENT speculum and visualized Removal mechanism: alligator forceps Complexity: simple 1 objects recovered. Objects recovered: cotton swab tip Post-procedure assessment: foreign body removed  Patient tolerance: Patient tolerated the procedure well with no immediate complications   (including critical care time) .DIAGNOSTIC STUDIES: Oxygen Saturation is 99% on room air, normal by my interpretation.    COORDINATION OF CARE: 9:35 PM Discussed treatment plan with patient at beside, the patient agrees with the plan and has no further questions at this time.   Labs Review Labs Reviewed - No data to display  Imaging Review No results found.   EKG Interpretation None      MDM   Final diagnoses:  Foreign body in ear, left, initial encounter    59 y.o. female here with L ear pain after getting a cotton swab stuck in her ear. No drainage or fevers. Cotton swab removed with alligator forceps. No erythema or TM bulging noted. Discussed w/ pt to use tylenol/motrin and heat for pain, and f/up with her PCP in 3-4 days for recheck. I explained the diagnosis and have given  explicit precautions to return to the ER including for any other new or worsening symptoms. The patient understands and accepts the medical plan as it's been dictated and I have answered their questions. Discharge instructions concerning home care and prescriptions have been given. The patient is STABLE and is discharged to home in good condition.   I personally performed the services described in this documentation, which was scribed in my presence. The recorded information has been reviewed and is accurate.  BP 153/92 mmHg  Pulse 98  Temp(Src) 98.5 F (36.9 C) (Oral)  Resp 16  SpO2 99%     Bary Limbach Camprubi-Soms, PA-C 07/02/14 [place], MD 07/05/14 1353

## 2014-07-03 ENCOUNTER — Ambulatory Visit: Payer: Self-pay | Admitting: Family Medicine

## 2014-07-05 ENCOUNTER — Other Ambulatory Visit: Payer: Self-pay | Admitting: Internal Medicine

## 2014-07-09 NOTE — Telephone Encounter (Addendum)
They never received your Rx from 5/4 for # 90 with 1 refills.  I gave them this order.

## 2014-08-03 ENCOUNTER — Emergency Department (HOSPITAL_COMMUNITY)
Admission: EM | Admit: 2014-08-03 | Discharge: 2014-08-03 | Disposition: A | Discharge status: Home / Self Care | Payer: Self-pay | Attending: Emergency Medicine | Admitting: Emergency Medicine

## 2014-08-03 ENCOUNTER — Telehealth: Payer: Self-pay | Admitting: Family Medicine

## 2014-08-03 ENCOUNTER — Emergency Department (HOSPITAL_COMMUNITY): Payer: No Typology Code available for payment source

## 2014-08-03 ENCOUNTER — Encounter (HOSPITAL_COMMUNITY): Payer: Self-pay | Admitting: *Deleted

## 2014-08-03 ENCOUNTER — Emergency Department (HOSPITAL_COMMUNITY): Payer: Self-pay

## 2014-08-03 DIAGNOSIS — R1013 Epigastric pain: Secondary | ICD-10-CM | POA: Insufficient documentation

## 2014-08-03 DIAGNOSIS — Z79899 Other long term (current) drug therapy: Secondary | ICD-10-CM | POA: Insufficient documentation

## 2014-08-03 DIAGNOSIS — R112 Nausea with vomiting, unspecified: Secondary | ICD-10-CM | POA: Insufficient documentation

## 2014-08-03 DIAGNOSIS — Z8719 Personal history of other diseases of the digestive system: Secondary | ICD-10-CM | POA: Insufficient documentation

## 2014-08-03 DIAGNOSIS — R109 Unspecified abdominal pain: Secondary | ICD-10-CM

## 2014-08-03 DIAGNOSIS — R1110 Vomiting, unspecified: Secondary | ICD-10-CM

## 2014-08-03 LAB — URINE MICROSCOPIC-ADD ON

## 2014-08-03 LAB — COMPREHENSIVE METABOLIC PANEL
ALK PHOS: 70 U/L (ref 38–126)
ALT: 17 U/L (ref 14–54)
ANION GAP: 7 (ref 5–15)
AST: 19 U/L (ref 15–41)
Albumin: 4.2 g/dL (ref 3.5–5.0)
BILIRUBIN TOTAL: 2 mg/dL — AB (ref 0.3–1.2)
BUN: 18 mg/dL (ref 6–20)
CHLORIDE: 108 mmol/L (ref 101–111)
CO2: 25 mmol/L (ref 22–32)
Calcium: 9.3 mg/dL (ref 8.9–10.3)
Creatinine, Ser: 0.71 mg/dL (ref 0.44–1.00)
GFR calc non Af Amer: >60 mL/min (ref >60)
GLUCOSE: 122 mg/dL — AB (ref 65–99)
POTASSIUM: 3.6 mmol/L (ref 3.5–5.1)
Sodium: 140 mmol/L (ref 135–145)
Total Protein: 7.3 g/dL (ref 6.5–8.1)

## 2014-08-03 LAB — CBC
HEMATOCRIT: 40.4 % (ref 36.0–46.0)
HEMOGLOBIN: 13.7 g/dL (ref 12.0–15.0)
MCH: 31.6 pg (ref 26.0–34.0)
MCHC: 33.9 g/dL (ref 30.0–36.0)
MCV: 93.1 fL (ref 78.0–100.0)
Platelets: 274 10*3/uL (ref 150–400)
RBC: 4.34 MIL/uL (ref 3.87–5.11)
RDW: 12.8 % (ref 11.5–15.5)
WBC: 8.1 10*3/uL (ref 4.0–10.5)

## 2014-08-03 LAB — URINALYSIS, ROUTINE W REFLEX MICROSCOPIC
GLUCOSE, UA: NEGATIVE mg/dL
Ketones, ur: 15 mg/dL — AB
Nitrite: NEGATIVE
Protein, ur: 30 mg/dL — AB
Specific Gravity, Urine: 1.031 — ABNORMAL HIGH (ref 1.005–1.030)
Urobilinogen, UA: 1 mg/dL (ref 0.0–1.0)
pH: 5 (ref 5.0–8.0)

## 2014-08-03 LAB — LIPASE, BLOOD: Lipase: 12 U/L — ABNORMAL LOW (ref 22–51)

## 2014-08-03 MED ORDER — DICYCLOMINE HCL 20 MG PO TABS: 20.0000 mg | ORAL_TABLET | Freq: Two times a day (BID) | ORAL | Status: DC

## 2014-08-03 MED ORDER — IOHEXOL 300 MG/ML  SOLN
100.0000 mL | Freq: Once | INTRAMUSCULAR | Status: AC | PRN
  Administered 2014-08-03: 100 mL via INTRAVENOUS

## 2014-08-03 MED ORDER — ONDANSETRON 4 MG PO TBDP: 4.0000 mg | ORAL_TABLET | Freq: Three times a day (TID) | ORAL | Status: DC | PRN

## 2014-08-03 MED ORDER — ONDANSETRON 4 MG PO TBDP
4.0000 mg | ORAL_TABLET | Freq: Once | ORAL | Status: AC | PRN
  Administered 2014-08-03: 4 mg via ORAL

## 2014-08-03 MED ORDER — ONDANSETRON 4 MG PO TBDP
ORAL_TABLET | ORAL | Status: AC
  Filled 2014-08-03: qty 1

## 2014-08-03 NOTE — Discharge Instructions (Signed)
Take zofran as needed for nausea and vomiting. Take bentyl as needed for abdominal pain. Refer to attached documents for more information.

## 2014-08-03 NOTE — ED Notes (Signed)
Pt off unit with Xray 

## 2014-08-03 NOTE — ED Notes (Addendum)
Pt states that she has had N/V for 2 days. Pt reports vomitting x4 today. Pt reports abdominal pain as well. Pt states that this feels similar to her past bowel obstructions.

## 2014-08-03 NOTE — Telephone Encounter (Signed)
Patient calling because she thinks she has a SBO (states she's it for a while and it's never resolved- per last clinic note having reflux type symptoms, occasional IBS symptoms, and referred to GI).  She started throwing up yellow material this morning. Saw a small amount of blood in her emesis in this morning. Tried Ginger Ale but vomited it up. Endorses central abdominal pain. Last BM was yesterday. Continues to pass gas. States she's going to the ED now due to the vomiting.   Joanna Puff, MD [facility] Resident  08/03/2014, 1:27 PM

## 2014-08-03 NOTE — ED Provider Notes (Signed)
CSN: [id number]     Arrival date & time 08/03/14  1419 History   First MD Initiated Contact with Patient 08/03/14 1540     Chief Complaint  Patient presents with  . Nausea  . Emesis     (Consider location/radiation/quality/duration/timing/severity/associated sxs/prior Treatment) HPI Comments: Patient is a 59 year old female with a past medical history of bowel obstruction who presents with abdominal pain that started today. The pain is located in the epigastric area and does not radiate. The pain is described as aching and severe. The pain started gradually and progressively worsened since the onset. No alleviating/aggravating factors. The patient has tried nothing for symptoms without relief. Associated symptoms include nausea and 4 episodes of vomititng. Patient denies fever, headache, diarrhea, chest pain, SOB, dysuria, constipation, abnormal vaginal bleeding/discharge. Patient reports this feels like when she has had bowel obstructions in the past. Last bowel movement yesterday.      Past Medical History  Diagnosis Date  . Small bowel obstruction 2013, 2015   Past Surgical History  Procedure Laterality Date  . Abdominal hysterectomy      [facility]  . Cholecystectomy    . Abdominal hernia repair    . Left ankle surgery     No family history on file. History  Substance Use Topics  . Smoking status: Never Smoker   . Smokeless tobacco: Not on file  . Alcohol Use: Yes     Comment: sometimes   OB History    No data available     Review of Systems  Constitutional: Negative for fever, chills and fatigue.  HENT: Negative for trouble swallowing.   Eyes: Negative for visual disturbance.  Respiratory: Negative for shortness of breath.   Cardiovascular: Negative for chest pain and palpitations.  Gastrointestinal: Positive for nausea, vomiting and abdominal pain. Negative for diarrhea.  Genitourinary: Negative for dysuria and difficulty urinating.  Musculoskeletal: Negative  for arthralgias and neck pain.  Skin: Negative for color change.  Neurological: Negative for dizziness and weakness.  Psychiatric/Behavioral: Negative for dysphoric mood.      Allergies  Review of patient's allergies indicates no known allergies.  Home Medications   Prior to Admission medications   Medication Sig Start Date End Date Taking? Authorizing Provider  nitroGLYCERIN (NITROSTAT) 0.4 MG SL tablet Place 1 tablet (0.4 mg total) under the tongue every 5 (five) minutes as needed for chest pain. Patient not taking: Reported on 05/21/2014 10/05/12   Elenora Gamma, MD  omeprazole (PRILOSEC) 20 MG capsule Take 1 capsule (20 mg total) by mouth daily. 06/11/14   Stephanie Coup Street, MD  polyethylene glycol powder [facility]) powder Take 17 g by mouth 2 (two) times daily as needed. 06/11/14   Stephanie Coup Street, MD  promethazine (PHENERGAN) 25 MG tablet Take 1 tablet (25 mg total) by mouth every 6 (six) hours as needed for nausea or vomiting. Patient not taking: Reported on 05/21/2014 05/19/14   Charm Rings, MD   BP 108/69 mmHg  Pulse 57  Temp(Src) 98.2 F (36.8 C) (Oral)  Resp 16  SpO2 98% Physical Exam  Constitutional: She is oriented to person, place, and time. She appears well-developed and well-nourished. No distress.  HENT:  Head: Normocephalic and atraumatic.  Eyes: Conjunctivae and EOM are normal.  Neck: Normal range of motion.  Cardiovascular: Normal rate and regular rhythm.  Exam reveals no gallop and no friction rub.   No murmur heard. Pulmonary/Chest: Effort normal and breath sounds normal. She has no wheezes. She  has no rales. She exhibits no tenderness.  Abdominal: Soft. She exhibits no distension. There is tenderness. There is no rebound.  Epigastric tenderness to palpation. No focal tenderness to palpation.   Musculoskeletal: Normal range of motion.  Neurological: She is alert and oriented to person, place, and time. Coordination normal.  Speech is  goal-oriented. Moves limbs without ataxia.   Skin: Skin is warm and dry.  Psychiatric: She has a normal mood and affect. Her behavior is normal.  Nursing note and vitals reviewed.   ED Course  Procedures (including critical care time) Labs Review Labs Reviewed  LIPASE, BLOOD - Abnormal; Notable for the following:    Lipase 12 (*)    All other components within normal limits  COMPREHENSIVE METABOLIC PANEL - Abnormal; Notable for the following:    Glucose, Bld 122 (*)    Total Bilirubin 2.0 (*)    All other components within normal limits  URINALYSIS, ROUTINE W REFLEX MICROSCOPIC (NOT AT [facility]) - Abnormal; Notable for the following:    Color, Urine AMBER (*)    APPearance TURBID (*)    Specific Gravity, Urine 1.031 (*)    Hgb urine dipstick MODERATE (*)    Bilirubin Urine SMALL (*)    Ketones, ur 15 (*)    Protein, ur 30 (*)    Leukocytes, UA TRACE (*)    All other components within normal limits  URINE MICROSCOPIC-ADD ON - Abnormal; Notable for the following:    Casts GRANULAR CAST (*)    All other components within normal limits  CBC    Imaging Review Ct Abdomen Pelvis W Contrast  08/03/2014   CLINICAL DATA:  Abdominal pain, nausea and vomiting 2 days. Previous hysterectomy and cholecystectomy. Hernia repair.  EXAM: CT ABDOMEN AND PELVIS WITH CONTRAST  TECHNIQUE: Multidetector CT imaging of the abdomen and pelvis was performed using the standard protocol following bolus administration of intravenous contrast.  CONTRAST:  OMNIPAQUE IOHEXOL 300 MG/ML  SOLN  COMPARISON:  08/15/2012 and 04/24/2011  FINDINGS: Lung bases are within normal.  Abdominal images demonstrate evidence of previous cholecystectomy. Mild stable post cholecystectomy prominence of the common bile duct unchanged. Sub cm hypodensity over the dome of the right lobe of the liver unchanged likely a small cyst or hemangioma.  The spleen, pancreas and adrenal glands are within normal. Kidneys are normal in size  without hydronephrosis or nephrolithiasis. Stable 1.7 cm cyst over the mid to lower pole left kidney. Ureters are within normal. The appendix is normal.  There is minimal calcified plaque over the abdominal aorta. Mesentery is within normal. Colon is unremarkable.  There is evidence of patient's ventral hernia repair. There are a few mildly prominent small bowel loops abutting the site of anterior abdominal wall hernia repair, although no dilated loops and no definite obstruction. There is minimal wall thickening and subtle inflammatory change involving several of the small bowel loops. No significant free fluid.  Pelvic images demonstrate evidence of previous bilateral tubal ligation. There are numerous calcifications likely phleboliths. The bladder and rectum are within normal. Remainder of the exam is unchanged.  IMPRESSION: Evidence of previous ventral hernia repair over the left mid to lower abdomen. There are a few mildly prominent, but nondilated small bowel loops with minimal wall thickening and subtle inflammatory change just deep to the site of hernia repair. There is no evidence of obstruction at this time as findings may be due to a regional enteritis versus developing/partial early obstructive changes from adhesions. Recommend attention  to this area on follow-up.  Sub cm hypodensity over the right dome of the liver unchanged, likely a cyst or hemangioma. 1.7 cm left renal cyst.   Electronically Signed   By: Elberta Fortis M.D.   On: 08/03/2014 18:32   Dg Abd Acute W/chest  08/03/2014   CLINICAL DATA:  Abdominal pain, vomiting. History of small bowel obstruction.  EXAM: DG ABDOMEN ACUTE W/ 1V CHEST  COMPARISON:  05/21/2014  FINDINGS: Prior cholecystectomy. Nonobstructive bowel gas pattern. No free air organomegaly. Calcified phleboliths in the pelvis, stable.  Heart and mediastinal contours are within normal limits. No focal opacities or effusions. No acute bony abnormality.  IMPRESSION: No evidence of  bowel obstruction.  Prior cholecystectomy.  No active cardiopulmonary disease.   Electronically Signed   By: Charlett Nose M.D.   On: 08/03/2014 16:49     EKG Interpretation None      MDM   Final diagnoses:  Vomiting  Abdominal pain    3:41 PM Vitals stable and patient afebrile. Labs pending  Patient's CT shows no signs of bowel obstruction. Patient feeling better. Possible enteritis. Patient will be discharged with bentyl and zofran for symptoms. Patient instructed to drink plenty of fluids and return to the ED with worsening or concerning symptoms.     [address] [place], PA-C 08/04/14 0041  Eber Hong, MD 08/04/14 [phone]

## 2014-08-07 ENCOUNTER — Encounter: Payer: Self-pay | Admitting: Internal Medicine

## 2014-08-07 ENCOUNTER — Ambulatory Visit (INDEPENDENT_AMBULATORY_CARE_PROVIDER_SITE_OTHER): Payer: Medicaid Other | Admitting: Internal Medicine

## 2014-08-07 VITALS — BP 154/88 | HR 85 | Temp 98.1°F | Ht 67.0 in | Wt 195.2 lb

## 2014-08-07 DIAGNOSIS — S90211A Contusion of right great toe with damage to nail, initial encounter: Secondary | ICD-10-CM

## 2014-08-07 DIAGNOSIS — I10 Essential (primary) hypertension: Secondary | ICD-10-CM

## 2014-08-07 DIAGNOSIS — E1165 Type 2 diabetes mellitus with hyperglycemia: Secondary | ICD-10-CM | POA: Diagnosis not present

## 2014-08-07 DIAGNOSIS — Q210 Ventricular septal defect: Secondary | ICD-10-CM | POA: Diagnosis not present

## 2014-08-07 DIAGNOSIS — E119 Type 2 diabetes mellitus without complications: Secondary | ICD-10-CM

## 2014-08-07 DIAGNOSIS — X58XXXA Exposure to other specified factors, initial encounter: Secondary | ICD-10-CM

## 2014-08-07 LAB — GLUCOSE, CAPILLARY: Glucose-Capillary: 131 mg/dL — ABNORMAL HIGH (ref 65–99)

## 2014-08-07 LAB — POCT GLYCOSYLATED HEMOGLOBIN (HGB A1C): Hemoglobin A1C: 7.4

## 2014-08-07 MED ORDER — HYDROCHLOROTHIAZIDE 25 MG PO TABS: 25.0000 mg | ORAL_TABLET | Freq: Every day | ORAL | Status: DC

## 2014-08-07 NOTE — Assessment & Plan Note (Addendum)
BP Readings from Last 3 Encounters:  08/07/14 154/88  07/02/14 153/92  05/22/14 156/91    Lab Results  Component Value Date   NA 138 05/08/2014   K 4.1 05/08/2014   CREATININE 0.68 05/08/2014    Assessment: Blood pressure control:   Progress toward BP goal:    Comments: Patient says [facility] wants to charge her a copay for HCTZ so she has been out for about 1 month.  She has continued Losartan 50mg  daily.  She [facility], only about $3.  Plan: Medications:  continue current medications:  Losartan 50mg  daily, RESUME HCTZ 25mg  daily ([facility] [place]) Educational resources provided: brochure (denies) Self management tools provided:   Other plans: BMP today.  Follow-up BP next visit.

## 2014-08-07 NOTE — Assessment & Plan Note (Signed)
Asymptomatic.  Due for yearly recall with cardiology.  Already scheduled for dental exam next week. - referral to cards placed (she last saw Dr. Johnsie Cancel 1 year ago)

## 2014-08-07 NOTE — Progress Notes (Signed)
Subjective:    Patient ID: Janice Massey, female    DOB: 1955-01-11, 59 y.o.   MRN: [id number]  HPI Comments: Janice Massey is a 59 year old woman with PMH as below here for follow-up of chronic conditions.  Please see problem based charting for assessment and plan.      Review of Systems  Constitutional: Negative for fever, chills and appetite change.  Respiratory: Negative for cough and shortness of breath.   Cardiovascular: Negative for chest pain, palpitations and leg swelling.  Gastrointestinal: Negative for nausea, vomiting, diarrhea, constipation and blood in stool.  Genitourinary: Negative for dysuria, hematuria and difficulty urinating.  Neurological: Negative for dizziness and light-headedness.       Past Medical History  Diagnosis Date  . Diabetes mellitus   . Hypertension   . Back pain   . Chronic PID   . Ventricular septal defect 2004 per echo    small membranous VSD  . [place]   . Deaf     Since age 49  . Atypical chest pain     myoview 10/29/10 - Post-stress EF 56%. Low risk and negative for ischemia   . Obese   . Dyslipidemia   . Cardiac murmur     small membranous VSD with fairly loud cardiac murmur (asymptomatic)   . Lightheadedness   . Dizziness   . Dyspnea   . History of Doppler ultrasound 06/20/09    Normal, no prior studies for comparison.   . Chronic PID (chronic pelvic inflammatory disease)   . Back pain   . URI (upper respiratory infection)    Current Outpatient Prescriptions on File Prior to Visit  Medication Sig Dispense Refill  . albuterol (PROVENTIL HFA;VENTOLIN HFA) 108 (90 BASE) MCG/ACT inhaler Inhale 1-2 puffs into the lungs every 6 (six) hours as needed for wheezing or shortness of breath. 1 Inhaler 1  . aspirin 81 MG tablet Take 81 mg by mouth every morning.    Marland Kitchen atorvastatin (LIPITOR) 80 MG tablet Take 1 tablet (80 mg total) by mouth daily. 30 tablet 11  . clotrimazole-betamethasone (LOTRISONE) cream Apply 1 application topically daily. 30  g 0  . cyclobenzaprine (FLEXERIL) 10 MG tablet Take 1 tablet (10 mg total) by mouth 3 (three) times daily as needed for muscle spasms. 15 tablet 0  . hydrochlorothiazide (HYDRODIURIL) 25 MG tablet Take 1 tablet (25 mg total) by mouth daily. 90 tablet 1  . ibuprofen (ADVIL,MOTRIN) 800 MG tablet Take 1 tablet (800 mg total) by mouth 3 (three) times daily. 15 tablet 0  . losartan (COZAAR) 50 MG tablet Take 1 tablet (50 mg total) by mouth daily. 30 tablet 1  . metFORMIN (GLUCOPHAGE) 1000 MG tablet Take 1 tablet (1,000 mg total) by mouth 2 (two) times daily. 180 tablet 1  . omeprazole (PRILOSEC) 20 MG capsule Take 1 capsule (20 mg total) by mouth daily. 30 capsule 1  . sucralfate (CARAFATE) 1 GM/10ML suspension Take 10 mLs (1 g total) by mouth 4 (four) times daily. 420 mL 0   No current facility-administered medications on file prior to visit.     Filed Vitals:   08/07/14 1323  BP: 154/88  Pulse: 85  Temp: 98.1 F (36.7 C)  TempSrc: Oral  Height: 5\' 7"  (1.702 m)  Weight: 195 lb 3.2 oz (88.542 kg)  SpO2: 99%    Objective:   Physical Exam  Constitutional: She is oriented to person, place, and time. She appears well-developed. No distress.  HENT:  Head:  Normocephalic and atraumatic.  Mouth/Throat: Oropharynx is clear and moist. No oropharyngeal exudate.  Eyes: EOM are normal. Pupils are equal, round, and reactive to light.  Neck: Neck supple.  Cardiovascular: Normal rate and intact distal pulses.  Exam reveals no gallop and no friction rub.   Murmur heard. Pulmonary/Chest: Effort normal and breath sounds normal. No respiratory distress. She has no wheezes. She has no rales.  Abdominal: Soft. Bowel sounds are normal. She exhibits no distension. There is no tenderness. There is no rebound.  Musculoskeletal: Normal range of motion. She exhibits no edema or tenderness.  Neurological: She is alert and oriented to person, place, and time. No cranial nerve deficit.  Skin: Skin is warm. She is  not diaphoretic.  Right toe subungual hematoma  Psychiatric: She has a normal mood and affect. Her behavior is normal. Judgment and thought content normal.  Vitals reviewed.         Assessment & Plan:  Please see problem based charting for assessment and plan.

## 2014-08-07 NOTE — Assessment & Plan Note (Addendum)
Lab Results  Component Value Date   HGBA1C 7.4 08/07/2014   HGBA1C 6.9 04/03/2014   HGBA1C 6.3 10/10/2013     Assessment: Diabetes control:  uncontrolled Progress toward A1C goal:   worsened Comments: Patient admits to dietary indisrection.  Visiting her boyfriend's house, he is cooking for her, eating lots of bread.  She has gained a 10 pounds in the past year.  She walks for exercise.   Plan: Medications:  continue current medications:  Metformin 1000mg  BID - she reports compliance and is tolerating this med without problem Home glucose monitoring:  none Frequency:   Timing:   Instruction/counseling given: discussed the need for weight loss and discussed diet; she agrees to work on lifestyle changes Educational resources provided: brochure (denies) Self management tools provided:   Other plans: Advised meeting with dietician/DM educator to discuss diet but she declines.  RTC in 3 months

## 2014-08-07 NOTE — Patient Instructions (Signed)
1. I [facility]  Please start taking this medication every day.  Please return to see me in 3 months.   2. Please take all medications as prescribed.    3. If you have worsening of your symptoms or new symptoms arise, please call the clinic ([phone]), or go to the ER immediately if symptoms are severe.   Subungual Hematoma A subungual hematoma is a pocket of blood that collects under the fingernail or toenail. The pressure created by the blood under the nail can cause pain. CAUSES  A subungual hematoma occurs when an injury to the finger or toe causes a blood vessel beneath the nail to break. The injury can occur from a direct blow such as slamming a finger in a door. It can also occur from a repeated injury such as pressure on the foot in a shoe while running. A subungual hematoma is sometimes called runner's toe or tennis toe. SYMPTOMS   Blue or dark blue skin under the nail.  Pain or throbbing in the injured area. DIAGNOSIS  Your caregiver can determine whether you have a subungual hematoma based on your history and a physical exam. If your caregiver thinks you might have a broken (fractured) bone, X-rays may be taken. TREATMENT  Hematomas usually go away on their own over time. Your caregiver may make a hole in the nail to drain the blood. Draining the blood is painless and usually provides significant relief from pain and throbbing. The nail usually grows back normally after this procedure. In some cases, the nail may need to be removed. This is done if there is a cut under the nail that requires stitches (sutures). HOME CARE INSTRUCTIONS   Put ice on the injured area.  Put ice in a plastic bag.  Place a towel between your skin and the bag.  Leave the ice on for 15-20 minutes, 03-04 times a day for the first 1 to 2 days.  Elevate the injured area to help decrease pain and swelling.  If you were given a bandage, wear it for as long as  directed by your caregiver.  If part of your nail falls off, trim the remaining nail gently. This prevents the nail from catching on something and causing further injury.  Only take over-the-counter or prescription medicines for pain, discomfort, or fever as directed by your caregiver. SEEK IMMEDIATE MEDICAL CARE IF:   You have redness or swelling around the nail.  You have yellowish-white fluid (pus) coming from the nail.  Your pain is not controlled with medicine.  You have a fever. MAKE SURE YOU:  Understand these instructions.  Will watch your condition.  Will get help right away if you are not doing well or get worse. Document Released: 12/19/1999 Document Revised: 03/15/2011 Document Reviewed: 12/09/2010 [facility] Patient Information 2015 [place], [place]. This information is not intended to replace advice given to you by your health care provider. Make sure you discuss any questions you have with your health care provider.

## 2014-08-08 LAB — BASIC METABOLIC PANEL
BUN/Creatinine Ratio: 12 (ref 9–23)
BUN: 9 mg/dL (ref 6–24)
CHLORIDE: 100 mmol/L (ref 97–108)
CO2: 23 mmol/L (ref 18–29)
Calcium: 9.1 mg/dL (ref 8.7–10.2)
Creatinine, Ser: 0.78 mg/dL (ref 0.57–1.00)
GFR calc Af Amer: 97 mL/min/{1.73_m2} (ref >59)
GFR, EST NON AFRICAN AMERICAN: 84 mL/min/{1.73_m2} (ref >59)
Glucose: 183 mg/dL — ABNORMAL HIGH (ref 65–99)
Potassium: 4.1 mmol/L (ref 3.5–5.2)
Sodium: 143 mmol/L (ref 134–144)

## 2014-08-19 ENCOUNTER — Encounter (HOSPITAL_COMMUNITY): Payer: Self-pay | Admitting: *Deleted

## 2014-08-19 ENCOUNTER — Emergency Department (INDEPENDENT_AMBULATORY_CARE_PROVIDER_SITE_OTHER)
Admission: EM | Admit: 2014-08-19 | Discharge: 2014-08-19 | Disposition: A | Discharge status: Home / Self Care | Payer: Medicaid Other | Source: Home / Self Care | Attending: Family Medicine | Admitting: Family Medicine

## 2014-08-19 DIAGNOSIS — L237 Allergic contact dermatitis due to plants, except food: Secondary | ICD-10-CM | POA: Diagnosis not present

## 2014-08-19 MED ORDER — FLUTICASONE PROPIONATE 0.05 % EX CREA: TOPICAL_CREAM | Freq: Two times a day (BID) | CUTANEOUS | Status: DC

## 2014-08-19 MED ORDER — HYDROXYZINE HCL 25 MG PO TABS: 25.0000 mg | ORAL_TABLET | Freq: Four times a day (QID) | ORAL | Status: DC

## 2014-08-19 MED ORDER — TRIAMCINOLONE ACETONIDE 40 MG/ML IJ SUSP
INTRAMUSCULAR | Status: AC
  Filled 2014-08-19: qty 1

## 2014-08-19 MED ORDER — METHYLPREDNISOLONE ACETATE 40 MG/ML IJ SUSP
80.0000 mg | Freq: Once | INTRAMUSCULAR | Status: AC
  Administered 2014-08-19: 80 mg via INTRAMUSCULAR

## 2014-08-19 MED ORDER — TRIAMCINOLONE ACETONIDE 40 MG/ML IJ SUSP
40.0000 mg | Freq: Once | INTRAMUSCULAR | Status: AC
  Administered 2014-08-19: 40 mg via INTRAMUSCULAR

## 2014-08-19 MED ORDER — METHYLPREDNISOLONE ACETATE 80 MG/ML IJ SUSP
INTRAMUSCULAR | Status: AC
  Filled 2014-08-19: qty 1

## 2014-08-19 NOTE — ED Notes (Signed)
Pt reports    A  Rash  That  Itches   And   Swelling  Of  Her  Face    With  Onset  Of  Symptoms  X  3 days         Pt  denys  Any  New  Medication   apperas  Alert  And  Oriented  And  Is  In no  acute  Severe  Distress

## 2014-08-19 NOTE — ED Provider Notes (Signed)
CSN: [id number]     Arrival date & time 08/19/14  1308 History   First MD Initiated Contact with Patient 08/19/14 1402     Chief Complaint  Patient presents with  . Rash   (Consider location/radiation/quality/duration/timing/severity/associated sxs/prior Treatment) Patient is a 59 y.o. female presenting with rash. The history is provided by the patient.  Rash Location:  Face and shoulder/arm Facial rash location:  L cheek Shoulder/arm rash location:  R forearm and L forearm Quality: blistering, draining, itchiness, redness and weeping   Quality: not painful   Severity:  Mild Onset quality:  Gradual Duration:  3 days Progression:  Spreading Chronicity:  New Context: plant contact   Relieved by:  None tried Worsened by:  Nothing tried   Past Medical History  Diagnosis Date  . Diabetes mellitus   . Hypertension   . Back pain   . Chronic PID   . Ventricular septal defect 2004 per echo    small membranous VSD  . [place]   . Deaf     Since age 56  . Atypical chest pain     myoview 10/29/10 - Post-stress EF 56%. Low risk and negative for ischemia   . Obese   . Dyslipidemia   . Cardiac murmur     small membranous VSD with fairly loud cardiac murmur (asymptomatic)   . Lightheadedness   . Dizziness   . Dyspnea   . History of Doppler ultrasound 06/20/09    Normal, no prior studies for comparison.   . Chronic PID (chronic pelvic inflammatory disease)   . Back pain   . URI (upper respiratory infection)    Past Surgical History  Procedure Laterality Date  . Cholecystectomy  11/04/1993  . Tubal ligation  10/05/1978  . Colposcopy    . Cardiac catheterization  08/23/95   Family History  Problem Relation Age of Onset  . Cancer Mother     liver  . Heart attack Mother 45  . Hypertension Father   . Diabetes Father   . Heart attack Father   . CAD Father 85  . Diabetes Paternal Aunt   . Diabetes Maternal Grandfather    Social History  Substance Use Topics  . Smoking status:  Never Smoker   . Smokeless tobacco: Never Used  . Alcohol Use: No   OB History    Gravida Para Term Preterm AB TAB SAB Ectopic Multiple Living   2 2 2       2      Review of Systems  Constitutional: Negative.   Skin: Positive for rash.    Allergies  Codeine; Lisinopril; and Penicillins  Home Medications   Prior to Admission medications   Medication Sig Start Date End Date Taking? Authorizing Provider  albuterol (PROVENTIL HFA;VENTOLIN HFA) 108 (90 BASE) MCG/ACT inhaler Inhale 1-2 puffs into the lungs every 6 (six) hours as needed for wheezing or shortness of breath. Patient not taking: Reported on 08/07/2014 08/08/13   Olegario Messier, NP  aspirin 81 MG tablet Take 81 mg by mouth every morning.    Historical Provider, MD  atorvastatin (LIPITOR) 80 MG tablet Take 1 tablet (80 mg total) by mouth daily. 04/03/14   Francesca Oman, DO  fluticasone (CUTIVATE) 0.05 % cream Apply topically 2 (two) times daily. 08/19/14   Billy Fischer, MD  hydrochlorothiazide (HYDRODIURIL) 25 MG tablet Take 1 tablet (25 mg total) by mouth daily. 08/07/14   Francesca Oman, DO  hydrOXYzine (ATARAX/VISTARIL) 25 MG tablet Take 1  tablet (25 mg total) by mouth every 6 (six) hours. For itching 08/19/14   Billy Fischer, MD  losartan (COZAAR) 50 MG tablet Take 1 tablet (50 mg total) by mouth daily. 05/08/14   Francesca Oman, DO  metFORMIN (GLUCOPHAGE) 1000 MG tablet Take 1 tablet (1,000 mg total) by mouth 2 (two) times daily. 05/08/14   Francesca Oman, DO  omeprazole (PRILOSEC) 20 MG capsule Take 1 capsule (20 mg total) by mouth daily. 05/08/14   Francesca Oman, DO   BP 142/84 mmHg  Pulse 78  Temp(Src) 98.6 F (37 C) (Oral)  Resp 18  SpO2 100% Physical Exam  Constitutional: She is oriented to person, place, and time. She appears well-developed and well-nourished. No distress.  Eyes: Pupils are equal, round, and reactive to light.  Neck: Normal range of motion. Neck supple.  Neurological: She is alert and oriented to person,  place, and time.  Skin: Skin is warm and dry. Rash noted.  Cd rash to face and ext  Nursing note and vitals reviewed.   ED Course  Procedures (including critical care time) Labs Review Labs Reviewed - No data to display  Imaging Review No results found.   MDM   1. Contact dermatitis due to poison ivy       Billy Fischer, MD 08/19/14 [phone]

## 2014-08-19 NOTE — Progress Notes (Signed)
Internal Medicine Clinic Attending  Case discussed with Dr. Wilson soon after the resident saw the patient.  We reviewed the resident's history and exam and pertinent patient test results.  I agree with the assessment, diagnosis, and plan of care documented in the resident's note.  

## 2014-08-20 ENCOUNTER — Encounter: Payer: Self-pay | Admitting: Cardiovascular Disease

## 2014-08-31 ENCOUNTER — Other Ambulatory Visit: Payer: Self-pay | Admitting: Internal Medicine

## 2014-09-08 ENCOUNTER — Other Ambulatory Visit: Payer: Self-pay | Admitting: Internal Medicine

## 2014-09-19 ENCOUNTER — Other Ambulatory Visit: Payer: Self-pay | Admitting: Internal Medicine

## 2014-10-01 ENCOUNTER — Encounter: Payer: Self-pay | Admitting: Cardiovascular Disease

## 2014-10-01 NOTE — Progress Notes (Signed)
Patient ID: Janice Massey, female   DOB: 1955-01-11, 59 y.o.   MRN: [id number] 59 y.o.  deaf patient seen by Dr Rollene Fare in past. History of presumed VSD. No dyspnea palpitations or syncope No chest pain. She has not had SBE.  Echo in system from 2014 does not mention VSD Long standing DM HTN and elevated lipids On appropriate Rx   Has hearing impaired services at home   Echo 03/28/13 Study Conclusions  - Left ventricle: The cavity size was normal. Wall thickness was normal. Systolic function was normal. The estimated ejection fraction was in the range of 55% to 60%. Wall motion was normal; there were no regional wall motion abnormalities. Doppler parameters are consistent with abnormal left ventricular relaxation (grade 1 diastolic dysfunction). - Ventricular septum: There was a small, restrictive perimembranous VSD. - Aortic valve: There was no stenosis. - Mitral valve: No significant regurgitation. - Left atrium: The atrium was mildly dilated. - Right ventricle: The cavity size was normal. Systolic function was normal. - Tricuspid valve: Peak RV-RA gradient: 36mm Hg (S). - Pulmonary arteries: PA peak pressure: 23mm Hg (S). - Inferior vena cava: The vessel was normal in size; the respirophasic diameter changes were in the normal range (= 50%); findings are consistent with normal central venous pressure. Impressions:  - Normal LV size and systolic function, EF 55-73%. Normal RV size and systolic function. There is a small, restrictive perimembranous VSD.   Myovue 5/15  Breast artifact otherwise normal  EF 63%     Goes to the dentist 2x/year    ROS: Denies fever, malais, weight loss, blurry vision, decreased visual acuity, cough, sputum, SOB, hemoptysis, pleuritic pain, palpitaitons, heartburn, abdominal pain, melena, lower extremity edema, claudication, or rash.  All other systems reviewed and negative  General: Affect appropriate Healthy:  appears stated age 35:  normal Neck supple with no adenopathy JVP normal no bruits no thyromegaly Lungs clear with no wheezing and good diaphragmatic motion Heart:  S1/S2 VSD  Murmur not as loud as expected , no rub, gallop or click PMI normal Abdomen: benighn, BS positve, no tenderness, no AAA no bruit.  No HSM or HJR Distal pulses intact with no bruits No edema Neuro non-focal Skin warm and dry No muscular weakness   Current Outpatient Prescriptions  Medication Sig Dispense Refill  . aspirin 81 MG tablet Take 81 mg by mouth every morning.    Marland Kitchen atorvastatin (LIPITOR) 80 MG tablet Take 1 tablet (80 mg total) by mouth daily. 30 tablet 11  . Calcium Carbonate Antacid (TUMS PO) Take 1 tablet by mouth daily.    . fluticasone (CUTIVATE) 0.05 % cream Apply topically 2 (two) times daily. 30 g 0  . hydrochlorothiazide (HYDRODIURIL) 25 MG tablet Take 1 tablet (25 mg total) by mouth daily. 90 tablet 1  . losartan (COZAAR) 50 MG tablet Take 100 mg by mouth daily.    . metFORMIN (GLUCOPHAGE) 1000 MG tablet Take 1 tablet (1,000 mg total) by mouth 2 (two) times daily. 180 tablet 1  . omeprazole (PRILOSEC) 20 MG capsule TAKE 1 CAPSULE (20 MG TOTAL) BY MOUTH DAILY. 30 capsule 1   No current facility-administered medications for this visit.    Allergies  Codeine; Lisinopril; and Penicillins  Electrocardiogram:  4/28  SR rate 89 ? Old ILMI   10/02/14  SR rate 92  ? Old ILMI no change from previous   Assessment and Plan VSD:  Small restrictive no signs of RV pressure or volume overload  SBE  F/u echo 03/2015 HTN: Well controlled.  Continue current medications and low sodium Dash type diet.   DM:  Discussed low carb diet.  Target hemoglobin A1c is 6.5 or less.  Continue current medications. Chol:   Lab Results  Component Value Date   LDLCALC 51 10/10/2013   Will decrease lipitor to 40 mg daily to minimize long term side effects  F/u with me in a year with echo  Jenkins Rouge

## 2014-10-02 ENCOUNTER — Encounter: Payer: Self-pay | Admitting: Cardiovascular Disease

## 2014-10-02 ENCOUNTER — Ambulatory Visit (INDEPENDENT_AMBULATORY_CARE_PROVIDER_SITE_OTHER): Payer: Medicaid Other | Admitting: Cardiovascular Disease

## 2014-10-02 VITALS — BP 120/70 | HR 92 | Ht 67.0 in | Wt 186.4 lb

## 2014-10-02 DIAGNOSIS — R010 Benign and innocent cardiac murmurs: Secondary | ICD-10-CM

## 2014-10-02 DIAGNOSIS — E785 Hyperlipidemia, unspecified: Secondary | ICD-10-CM | POA: Diagnosis not present

## 2014-10-02 DIAGNOSIS — I10 Essential (primary) hypertension: Secondary | ICD-10-CM

## 2014-10-02 DIAGNOSIS — R011 Cardiac murmur, unspecified: Secondary | ICD-10-CM

## 2014-10-02 DIAGNOSIS — Q210 Ventricular septal defect: Secondary | ICD-10-CM | POA: Diagnosis not present

## 2014-10-02 MED ORDER — LOSARTAN POTASSIUM 50 MG PO TABS: 100.0000 mg | ORAL_TABLET | Freq: Every day | ORAL | Status: DC

## 2014-10-02 MED ORDER — HYDROCHLOROTHIAZIDE 25 MG PO TABS: 25.0000 mg | ORAL_TABLET | Freq: Every day | ORAL | Status: DC

## 2014-10-02 MED ORDER — ATORVASTATIN CALCIUM 40 MG PO TABS: 40.0000 mg | ORAL_TABLET | Freq: Every day | ORAL | Status: DC

## 2014-10-02 NOTE — Patient Instructions (Signed)
Medication Instructions:  DECREASE ATORVASTATIN  TO 40 MG  EVERY DAY  Labwork: NONE  Testing/Procedures: Your physician has requested that you have an echocardiogram. Echocardiography is a painless test that uses sound waves to create images of your heart. It provides your doctor with information about the size and shape of your heart and how well your heart's chambers and valves are working. This procedure takes approximately one hour. There are no restrictions for this procedure. DUE IN  YEAR  Follow-Up: Your physician wants you to follow-up in: [place] will receive a reminder letter in the mail two months in advance. If you don't receive a letter, please call our office to schedule the follow-up appointment.  Any Other Special Instructions Will Be Listed Below (If Applicable).

## 2014-11-11 ENCOUNTER — Other Ambulatory Visit: Payer: Self-pay

## 2014-11-11 ENCOUNTER — Ambulatory Visit (HOSPITAL_COMMUNITY): Payer: Medicaid Other | Attending: Cardiology

## 2014-11-11 ENCOUNTER — Encounter (HOSPITAL_COMMUNITY): Payer: Self-pay

## 2014-11-11 DIAGNOSIS — E785 Hyperlipidemia, unspecified: Secondary | ICD-10-CM | POA: Diagnosis not present

## 2014-11-11 DIAGNOSIS — Q210 Ventricular septal defect: Secondary | ICD-10-CM | POA: Diagnosis not present

## 2014-11-11 DIAGNOSIS — E119 Type 2 diabetes mellitus without complications: Secondary | ICD-10-CM | POA: Insufficient documentation

## 2014-11-11 DIAGNOSIS — I517 Cardiomegaly: Secondary | ICD-10-CM | POA: Insufficient documentation

## 2014-11-11 DIAGNOSIS — I10 Essential (primary) hypertension: Secondary | ICD-10-CM | POA: Diagnosis not present

## 2014-11-11 NOTE — Progress Notes (Signed)
FYI  Patient was present for an echocardiogram 11/11/14. She has mentioned chest pain ~7 of 10 near the sternum for the past three weeks. Does not last long. There were no symptoms in today's visit.   Wyatt Mage, [place] 11/11/2014

## 2014-11-12 ENCOUNTER — Telehealth: Payer: Self-pay | Admitting: *Deleted

## 2014-11-12 NOTE — Telephone Encounter (Signed)
DISCUSSED WITH [name]  IN SCHEDULING . INFO  GIVEN TO   [name] , TO CALL PT  AND  SCHEDULE  AN APPT  WITH  FLEX PA .Adonis Housekeeper

## 2014-11-12 NOTE — Telephone Encounter (Signed)
-----   Message from Josue Hector, MD sent at 11/11/2014  9:02 PM EST ----- Regarding: FW: Echo Patient She has a normal myovue last year have her see flex PA this week  ----- Message -----    From: Etter Sjogren, RDCS    Sent: 11/11/2014  11:20 AM      To: Josue Hector, MD, Richmond Campbell, LPN Subject: Echo Patient                                   Dr. Johnsie Cancel and Altha Harm,  Good morning! Hope you're well.  I just did an echo on a patient of yours Janice Massey, [place]) and she mentioned that she has had a 7 of 10 chest pain through her sternum on and off for the past 3 weeks. She was asymptomatic today so I let her go. Please help me and see if you can follow up with her and see how she is doing.  Thanks!  Abe People

## 2014-11-13 ENCOUNTER — Ambulatory Visit (INDEPENDENT_AMBULATORY_CARE_PROVIDER_SITE_OTHER): Payer: Medicaid Other | Admitting: Internal Medicine

## 2014-11-13 ENCOUNTER — Encounter: Payer: Self-pay | Admitting: Internal Medicine

## 2014-11-13 VITALS — BP 146/76 | HR 72 | Temp 98.3°F | Ht 67.0 in | Wt 189.5 lb

## 2014-11-13 DIAGNOSIS — R079 Chest pain, unspecified: Secondary | ICD-10-CM

## 2014-11-13 DIAGNOSIS — Z0000 Encounter for general adult medical examination without abnormal findings: Secondary | ICD-10-CM

## 2014-11-13 DIAGNOSIS — J45909 Unspecified asthma, uncomplicated: Secondary | ICD-10-CM

## 2014-11-13 DIAGNOSIS — Z23 Encounter for immunization: Secondary | ICD-10-CM | POA: Diagnosis not present

## 2014-11-13 DIAGNOSIS — E119 Type 2 diabetes mellitus without complications: Secondary | ICD-10-CM

## 2014-11-13 DIAGNOSIS — I10 Essential (primary) hypertension: Secondary | ICD-10-CM

## 2014-11-13 LAB — POCT GLYCOSYLATED HEMOGLOBIN (HGB A1C): Hemoglobin A1C: 6.2

## 2014-11-13 LAB — GLUCOSE, CAPILLARY: GLUCOSE-CAPILLARY: 117 mg/dL — AB (ref 65–99)

## 2014-11-13 NOTE — Patient Instructions (Addendum)
1. Please be sure to follow-up with Dr. Johnsie Cancel tomorrow.   2. Please take all medications as prescribed.     3. If you have worsening of your symptoms or new symptoms arise, please call the clinic ([phone]), or go to the ER immediately if symptoms are severe.   Come back to see me in 1 month to check on your blood pressure.

## 2014-11-13 NOTE — Progress Notes (Addendum)
Subjective:    Patient ID: Janice Massey, female    DOB: 1955-03-05, 59 y.o.   MRN: [id number]  HPI Comments: Janice Massey is a 59 year old woman with PMH as below here for follow-up of HTN and DM.  Please see problem based charting for the status of her chronic conditions.    Past Medical History  Diagnosis Date  . Diabetes mellitus   . Hypertension   . Back pain   . Chronic PID   . Ventricular septal defect 2004 per echo    small membranous VSD  . Mute   . Deaf     Since age 23  . Atypical chest pain     myoview 10/29/10 - Post-stress EF 56%. Low risk and negative for ischemia   . Obese   . Dyslipidemia   . Cardiac murmur     small membranous VSD with fairly loud cardiac murmur (asymptomatic)   . Lightheadedness   . Dizziness   . Dyspnea   . History of Doppler ultrasound 06/20/09    Normal, no prior studies for comparison.   . Chronic PID (chronic pelvic inflammatory disease)   . Back pain   . URI (upper respiratory infection)    Current Outpatient Prescriptions on File Prior to Visit  Medication Sig Dispense Refill  . aspirin 81 MG tablet Take 81 mg by mouth every morning.    Marland Kitchen atorvastatin (LIPITOR) 40 MG tablet Take 1 tablet (40 mg total) by mouth daily. 90 tablet 3  . Calcium Carbonate Antacid (TUMS PO) Take 1 tablet by mouth daily.    . fluticasone (CUTIVATE) 0.05 % cream Apply topically 2 (two) times daily. 30 g 0  . hydrochlorothiazide (HYDRODIURIL) 25 MG tablet Take 1 tablet (25 mg total) by mouth daily. 90 tablet 3  . losartan (COZAAR) 50 MG tablet Take 2 tablets (100 mg total) by mouth daily. 180 tablet 3  . metFORMIN (GLUCOPHAGE) 1000 MG tablet Take 1 tablet (1,000 mg total) by mouth 2 (two) times daily. 180 tablet 1  . omeprazole (PRILOSEC) 20 MG capsule TAKE 1 CAPSULE (20 MG TOTAL) BY MOUTH DAILY. 30 capsule 1   No current facility-administered medications on file prior to visit.    Review of Systems  Constitutional: Negative for fever, chills and appetite  change.  Eyes: Negative for visual disturbance.  Respiratory: Negative for cough, shortness of breath and wheezing.   Cardiovascular: Positive for chest pain. Negative for palpitations and leg swelling.  Gastrointestinal: Negative for nausea, vomiting, abdominal pain, diarrhea, constipation and blood in stool.  Genitourinary: Negative for dysuria and difficulty urinating.  Neurological: Negative for syncope and light-headedness.  Psychiatric/Behavioral: Negative for dysphoric mood.       Filed Vitals:   11/13/14 1405  BP: 145/85  Pulse: 83  Temp: 98.3 F (36.8 C)  TempSrc: Oral  Height: 5\' 7"  (1.702 m)  Weight: 189 lb 8 oz (85.957 kg)  SpO2: 99%     Objective:   Physical Exam  Constitutional: She is oriented to person, place, and time. She appears well-developed. No distress.  HENT:  Head: Normocephalic and atraumatic.  Mouth/Throat: Oropharynx is clear and moist. No oropharyngeal exudate.  Eyes: EOM are normal. Pupils are equal, round, and reactive to light.  Neck: Neck supple.  Cardiovascular: Normal rate and regular rhythm.  Exam reveals no gallop and no friction rub.   Murmur heard. Pulmonary/Chest: Effort normal and breath sounds normal. No respiratory distress. She has no wheezes. She has no  rales.  Abdominal: Soft. Bowel sounds are normal. She exhibits no distension. There is no tenderness. There is no rebound.  Musculoskeletal: Normal range of motion. She exhibits no edema or tenderness.  Neurological: She is alert and oriented to person, place, and time. No cranial nerve deficit.  Skin: Skin is warm. She is not diaphoretic.  Psychiatric: She has a normal mood and affect. Her behavior is normal. Judgment and thought content normal.  Vitals reviewed.         Assessment & Plan:  Please see problem based charting for A&P.

## 2014-11-14 ENCOUNTER — Ambulatory Visit (INDEPENDENT_AMBULATORY_CARE_PROVIDER_SITE_OTHER): Payer: Medicaid Other | Admitting: Cardiology

## 2014-11-14 ENCOUNTER — Encounter: Payer: Self-pay | Admitting: Cardiology

## 2014-11-14 VITALS — BP 140/86 | HR 83 | Ht 67.0 in | Wt 186.8 lb

## 2014-11-14 DIAGNOSIS — R079 Chest pain, unspecified: Secondary | ICD-10-CM | POA: Diagnosis not present

## 2014-11-14 NOTE — Assessment & Plan Note (Signed)
BP Readings from Last 3 Encounters:  11/14/14 140/86  11/13/14 146/76  10/02/14 120/70    Lab Results  Component Value Date   NA 143 08/07/2014   K 4.1 08/07/2014   CREATININE 0.78 08/07/2014    Assessment: Blood pressure control:  slight elevation Progress toward BP goal:    Comments: She reports compliance with HCTZ 25mg  daily and losartan 100mg  daily (but reports that cardiologist instructed her to take 50mg  BID).  No ADRs reported.  Plan: Medications:  continue current medications:  HCTZ 25mg  daily and losartan 50mg  BID Educational resources provided:   Self management tools provided:   Other plans: Continue current therapy.  She is scheduled for cards visit on 11/10.  RTC in 1 month for BP follow-up.

## 2014-11-14 NOTE — Patient Instructions (Signed)
Medication Instructions:   Your physician recommends that you continue on your current medications as directed. Please refer to the Current Medication list given to you today.  If you need a refill on your cardiac medications before your next appointment, please call your pharmacy.  Labwork: NONE ORDER TODAY    Testing/Procedures:  Your physician has requested that you have a lexiscan myoview. For further information please visit [email or web address]. Please follow instruction sheet, as given.    Follow-Up:  WILL BE DETERMINED AFTER TEST RESULTS  Any Other Special Instructions Will Be Listed Below (If Applicable).

## 2014-11-14 NOTE — Assessment & Plan Note (Addendum)
Assessment:  Very well controlled.  No meds.  No dyspnea, lungs clear. Plan:  Has not had rx for rescue inhaler.  I will check with her at next visit to see if she requires one.

## 2014-11-14 NOTE — Progress Notes (Signed)
11/14/2014 Janice Massey   08-Jul-1955  [id number]  Primary Physician Duwaine Maxin, DO Primary Cardiologist: Dr. Johnsie Cancel   Reason for Visit/CC:  Chest Pain  HPI:  The patient is a 59 year old deaf female, followed by Dr. Johnsie Cancel. She was previously followed by Dr. Rollene Fare. She presents to clinic today with the assistance of a translator. Her past cardiac history is significant for VSD. Her most recent 2-D echocardiogram March 2015 revealed a small, restrictive perimembranous VSD. She was noted to have normal LV size and systolic function at that time with an estimated ejection fraction of 55-60%. Normal RV size and systolic function. She has no documented history of coronary disease. She underwent a Myoview stress test in May 2015 for chest pain which was a normal study without ischemia. EF was 63%. Additional past medical history is also significant for diabetes, hypertension and hyperlipidemia. She is on appropriate medical therapy and reports full daily compliance. She was last seen by Dr. Johnsie Cancel 09/2014 and instructed to follow-up in one year.  Today in clinic, she reports that she experienced substernal chest pressure and left arm pain about 2 weeks ago. Symptoms occurred after strenuous yard work, which included raking leaves. Rated as 5/10 discomfort lasting 5-10 minutes. She also had mild dyspnea but denies palpitations, lightheadedness, dizziness, syncope/near-syncope. She felt at that time that she may have over exerted herself. Symptoms improved with rest. Since that time she has continued to work in her yard, raking leaves but denies any recurrent symptoms. However,  earlier this morning she did feel a little winded while getting dressed. She is currently chest pain-free in clinic today. Her EKG demonstrates normal sinus rhythm. No ischemic abnormalities and no changes compared to her baseline.    Current Outpatient Prescriptions  Medication Sig Dispense Refill  . aspirin 81 MG tablet  Take 81 mg by mouth every morning.    Marland Kitchen atorvastatin (LIPITOR) 40 MG tablet Take 1 tablet (40 mg total) by mouth daily. 90 tablet 3  . Calcium Carbonate Antacid (TUMS PO) Take 1 tablet by mouth daily.    . hydrochlorothiazide (HYDRODIURIL) 25 MG tablet Take 1 tablet (25 mg total) by mouth daily. 90 tablet 3  . losartan (COZAAR) 50 MG tablet Take 2 tablets (100 mg total) by mouth daily. 180 tablet 3  . metFORMIN (GLUCOPHAGE) 1000 MG tablet Take 1 tablet (1,000 mg total) by mouth 2 (two) times daily. 180 tablet 1   No current facility-administered medications for this visit.    Allergies  Allergen Reactions  . Codeine Itching  . Lisinopril Cough  . Penicillins Itching    Social History   Social History  . Marital Status: Single    Spouse Name: N/A  . Number of Children: 1  . Years of Education: N/A   Occupational History  .     Social History Main Topics  . Smoking status: Never Smoker   . Smokeless tobacco: Never Used  . Alcohol Use: No  . Drug Use: No  . Sexual Activity: No   Other Topics Concern  . Not on file   Social History Narrative     Review of Systems: General: negative for chills, fever, night sweats or weight changes.  Cardiovascular: negative for chest pain, dyspnea on exertion, edema, orthopnea, palpitations, paroxysmal nocturnal dyspnea or shortness of breath Dermatological: negative for rash Respiratory: negative for cough or wheezing Urologic: negative for hematuria Abdominal: negative for nausea, vomiting, diarrhea, bright red blood per rectum, melena, or hematemesis Neurologic: negative  for visual changes, syncope, or dizziness All other systems reviewed and are otherwise negative except as noted above.    Blood pressure 140/86, pulse 83, height 5\' 7"  (1.702 m), weight 186 lb 12.8 oz (84.732 kg).  General appearance: alert, cooperative, no distress and hearing impared  Neck: no carotid bruit and no JVD Lungs: clear to auscultation  bilaterally Heart: regular rate and rhythm, S1, S2 normal, no murmur, click, rub or gallop Extremities: no LEE Pulses: 2+ and symmetric Skin: warm and dry Neurologic: Grossly normal  EKG NSR. No ischemia.   ASSESSMENT AND PLAN:   1. Chest Pain:  She notes recent history of chest discomfort following moderate to heavy physical exertion. Described as substernal pressure radiating to the left arm that resolved with rest. However she denies any further recurrence since that time. Given her risk factors of hypertension, hyperlipidemia and diabetes we will obtain a nuclear stress tests to rule out ischemia. Given the fact that there is a communication barrier, we will order for her to undergo a chemical stress test. For now, continue daily ASA and statin and antihypertensives for BP control.   2. HTN: followed by PCP. Controlled on HCTZ and Cozaar   3. HLD: on statin.  4. DM: followed by PCP. On Metformin. Recent Hgb A1c controlled at 6.2.   PLAN  Stress test to r/o ischemia. Will plan to f/u if abnormal study. If normal study, she can f/u with Dr. Johnsie Cancel 08/2015.  Lyda Jester PA-C 11/14/2014 12:18 PM

## 2014-11-14 NOTE — Assessment & Plan Note (Signed)
Lab Results  Component Value Date   HGBA1C 6.2 11/13/2014   HGBA1C 7.4 08/07/2014   HGBA1C 6.9 04/03/2014     Assessment: Diabetes control:  well controlled Progress toward A1C goal:   at goal Comments: Compliant with therapy.  No ADRs reported.  Plan: Medications:  continue current medications:  Metformin 1000mg  BID Home glucose monitoring:  none Frequency:   Timing:   Instruction/counseling given: Continue lifestyle changes. Educational resources provided: brochure (declined) Self management tools provided:   Other plans: Follow-up in 3-6 months.

## 2014-11-14 NOTE — Assessment & Plan Note (Addendum)
Assessment:  She was reluctant to tell me but during ROS she admits to an episode of chest pain with exertion about 1 week ago while doing yardwork. She describes central chest tightness lasting about 5 minutes with radiation to left arm (but not neck or back) and associated diaphoresis but no dyspnea or N/V.  She went inside to rest and symptoms resolved.  Symptoms c/w stable angina.  She has maintained her usual level of activity (including exertional activities) since then and has not had recurrence.  This has happened before, over 1 year ago and subsequent Myoview was normal.  She has hx of VSD with recent ECHO showing normal LV and RV size and normal EF.  She was last evaluated by cards about 6 weeks ago with plans for follow-up in 1 year.  After she had this CP last week she called cards and is scheduled to be seen tomorrow (11/10). She does not have chest pain today.  Her exam is reassuring and there has been no change in her known murmur. Plan:  She has already taken the initiative to arrange cardiology evaluation, which is tomorrow.  I will follow-up there note after the visit.  Will continue aggressive risk factor modification (statin, ASA, BP control, DM control).  She will return to see me in 1 month.

## 2014-11-16 ENCOUNTER — Other Ambulatory Visit: Payer: Self-pay | Admitting: Internal Medicine

## 2014-11-16 DIAGNOSIS — E119 Type 2 diabetes mellitus without complications: Secondary | ICD-10-CM

## 2014-11-16 MED ORDER — METFORMIN HCL 1000 MG PO TABS: 1000.0000 mg | ORAL_TABLET | Freq: Two times a day (BID) | ORAL | Status: DC

## 2014-11-20 ENCOUNTER — Telehealth (HOSPITAL_COMMUNITY): Payer: Self-pay | Admitting: *Deleted

## 2014-11-20 NOTE — Telephone Encounter (Signed)
Attempted to call patient regarding upcoming appointment- no answer. Hilbert Briggs J Rambo Sarafian, RN 

## 2014-11-21 ENCOUNTER — Telehealth (HOSPITAL_COMMUNITY): Payer: Self-pay

## 2014-11-21 NOTE — Telephone Encounter (Signed)
Left message with sister in reference to upcoming appointment scheduled for 11-25-2014. The sister is going to call us back when she is with the patient. Phone number given for Massey call back so details instructions can be given. Janice Massey, Janice Massey

## 2014-11-21 NOTE — Telephone Encounter (Signed)
Attempted to leave a message in reference to upcoming appointment scheduled for 11-25-2014 at phone number [phone], but there was a voicemail recording that person not available.   Oletta Lamas, Kareem Aul A

## 2014-11-25 ENCOUNTER — Ambulatory Visit (HOSPITAL_COMMUNITY): Payer: Medicaid Other | Attending: Cardiology

## 2014-11-25 DIAGNOSIS — R079 Chest pain, unspecified: Secondary | ICD-10-CM | POA: Diagnosis not present

## 2014-11-25 DIAGNOSIS — I10 Essential (primary) hypertension: Secondary | ICD-10-CM | POA: Diagnosis not present

## 2014-11-25 DIAGNOSIS — R0609 Other forms of dyspnea: Secondary | ICD-10-CM | POA: Diagnosis not present

## 2014-11-25 DIAGNOSIS — E119 Type 2 diabetes mellitus without complications: Secondary | ICD-10-CM | POA: Diagnosis not present

## 2014-11-25 MED ORDER — TECHNETIUM TC 99M SESTAMIBI GENERIC - CARDIOLITE
31.6000 | Freq: Once | INTRAVENOUS | Status: AC | PRN
  Administered 2014-11-25: 32 via INTRAVENOUS

## 2014-11-25 MED ORDER — REGADENOSON 0.4 MG/5ML IV SOLN
0.4000 mg | Freq: Once | INTRAVENOUS | Status: AC
  Administered 2014-11-25: 0.4 mg via INTRAVENOUS

## 2014-11-25 NOTE — Progress Notes (Signed)
Case discussed with Dr. Wilson soon after the resident saw the patient. We reviewed the resident's history and exam and pertinent patient test results. I agree with the assessment, diagnosis, and plan of care documented in the resident's note. 

## 2014-12-03 ENCOUNTER — Ambulatory Visit (HOSPITAL_COMMUNITY): Payer: Medicaid Other

## 2014-12-09 ENCOUNTER — Ambulatory Visit (HOSPITAL_COMMUNITY): Payer: Medicaid Other | Attending: Cardiology

## 2014-12-09 LAB — MYOCARDIAL PERFUSION IMAGING
CHL CUP NUCLEAR SSS: 2
CHL CUP RESTING HR STRESS: 67 {beats}/min
LHR: 0.21
LV dias vol: 100 mL
LVSYSVOL: 38 mL
NUC STRESS TID: 1
Peak HR: 117 {beats}/min
SDS: 0
SRS: 5

## 2014-12-09 MED ORDER — TECHNETIUM TC 99M SESTAMIBI GENERIC - CARDIOLITE
32.9000 | Freq: Once | INTRAVENOUS | Status: AC | PRN
  Administered 2014-12-09: 32.9 via INTRAVENOUS

## 2014-12-11 ENCOUNTER — Ambulatory Visit (INDEPENDENT_AMBULATORY_CARE_PROVIDER_SITE_OTHER): Payer: Medicaid Other | Admitting: Internal Medicine

## 2014-12-11 ENCOUNTER — Encounter: Payer: Self-pay | Admitting: Internal Medicine

## 2014-12-11 VITALS — BP 129/80 | HR 77 | Temp 98.2°F | Ht 67.0 in | Wt 187.0 lb

## 2014-12-11 DIAGNOSIS — R079 Chest pain, unspecified: Secondary | ICD-10-CM | POA: Diagnosis not present

## 2014-12-11 DIAGNOSIS — I10 Essential (primary) hypertension: Secondary | ICD-10-CM

## 2014-12-11 NOTE — Patient Instructions (Signed)
1. Your stress test was reassuring.  Please continue your current medications.  Your blood pressure and diabetes are under good control so let's continue to work on keeping them controlled.   You will be due for follow-up with Dr. Johnsie Cancel next fall 2017.  2. Please take all medications as prescribed.    3. If you have worsening of your symptoms or new symptoms arise, please call the clinic [phone]), or go to the ER immediately if symptoms are severe.  Please come back to see me at the end of March 2017.  Please call the clinic after the new year to make that appointment.

## 2014-12-11 NOTE — Progress Notes (Signed)
Subjective:    Patient ID: Janice Massey, female    DOB: [birth year], 59 y.o.   MRN: [id number]  HPI Comments: Janice Massey is a 59 year old woman with PMH as below here for follow-up of chest pain and HTN.  Please see problem based charting for update on these problems.    Past Medical History  Diagnosis Date  . Diabetes mellitus   . Hypertension   . Back pain   . Chronic PID   . Ventricular septal defect 2004 per echo    small membranous VSD  . Mute   . Deaf     Since age 89  . Atypical chest pain     myoview 10/29/10 - Post-stress EF 56%. Low risk and negative for ischemia   . Obese   . Dyslipidemia   . Cardiac murmur     small membranous VSD with fairly loud cardiac murmur (asymptomatic)   . Lightheadedness   . Dizziness   . Dyspnea   . History of Doppler ultrasound 06/20/09    Normal, no prior studies for comparison.   . Chronic PID (chronic pelvic inflammatory disease)   . Back pain   . URI (upper respiratory infection)    Current Outpatient Prescriptions on File Prior to Visit  Medication Sig Dispense Refill  . aspirin 81 MG tablet Take 81 mg by mouth every morning.    Marland Kitchen atorvastatin (LIPITOR) 40 MG tablet Take 1 tablet (40 mg total) by mouth daily. 90 tablet 3  . Calcium Carbonate Antacid (TUMS PO) Take 1 tablet by mouth daily.    . hydrochlorothiazide (HYDRODIURIL) 25 MG tablet Take 1 tablet (25 mg total) by mouth daily. 90 tablet 3  . losartan (COZAAR) 50 MG tablet Take 2 tablets (100 mg total) by mouth daily. 180 tablet 3  . metFORMIN (GLUCOPHAGE) 1000 MG tablet Take 1 tablet (1,000 mg total) by mouth 2 (two) times daily. 180 tablet 1   No current facility-administered medications on file prior to visit.    Review of Systems  Respiratory: Negative for shortness of breath.        No orthopnea or PND  Cardiovascular: Negative for chest pain, palpitations and leg swelling.  Neurological: Negative for syncope and light-headedness.       Filed Vitals:   12/11/14 1435 12/11/14 1436  BP:  129/80  Pulse:  77  Temp:  98.2 F (36.8 C)  TempSrc:  Oral  Height: 5\' 7"  (1.702 m)   Weight:  187 lb (84.823 kg)  SpO2:  100%     Objective:   Physical Exam  Constitutional: She is oriented to person, place, and time. She appears well-developed. No distress.  HENT:  Head: Normocephalic and atraumatic.  Mouth/Throat: Oropharynx is clear and moist. No oropharyngeal exudate.  Eyes: EOM are normal. Pupils are equal, round, and reactive to light. Right eye exhibits no discharge. Left eye exhibits no discharge. No scleral icterus.  Neck: Neck supple.  Cardiovascular: Normal rate and regular rhythm.  Exam reveals no gallop and no friction rub.   Murmur heard. 2/6 systolic murmur  Pulmonary/Chest: Effort normal and breath sounds normal. No respiratory distress. She has no wheezes. She has no rales.  Abdominal: Soft. Bowel sounds are normal. She exhibits no distension. There is no tenderness. There is no rebound and no guarding.  Musculoskeletal: Normal range of motion. She exhibits no edema or tenderness.  Neurological: She is alert and oriented to person, place, and time. No cranial nerve deficit.  Skin: Skin is warm. She is not diaphoretic.  Psychiatric: She has a normal mood and affect. Her behavior is normal. Judgment and thought content normal.  Vitals reviewed.         Assessment & Plan:  Please see problem based charting for A&P.

## 2014-12-11 NOTE — Assessment & Plan Note (Signed)
BP Readings from Last 3 Encounters:  12/11/14 129/80  11/14/14 140/86  11/13/14 146/76    Lab Results  Component Value Date   NA 143 08/07/2014   K 4.1 08/07/2014   CREATININE 0.78 08/07/2014    Assessment: Blood pressure control:  well controlled Progress toward BP goal:   at goal Comments: Compliant with medication.  No ADRs.  Plan: Medications:  continue current medications:  HCTZ 25mg  daily, losartan 50mg  BID Educational resources provided: brochure, handout, video Self management tools provided:   Other plans: RTC in 3 months for follow-up.

## 2014-12-11 NOTE — Assessment & Plan Note (Signed)
Assessment:  She has not had recurrence of chest pain.  She denies palpitations, dyspnea, leg edema, PND, orthopnea, lightheadedness or syncope. She was recently evaluated by cardiology.  Repeat 2D ECHO stable - normal EF, small VSD.  12/09/14 nuclear stress test was a low risk study.  She was informed of the results.  Plan:  Continue risk factor modification - DM and HTN are under excellent control.  Continue ASA 81mg  daily and atorvastatin 40mg  daily.  RTC in 3 months for follow-up.  Yearly follow-up with cardiology (Dr. Johnsie Cancel) in Aug/Sept 2017.

## 2014-12-12 NOTE — Progress Notes (Signed)
Internal Medicine Clinic Attending  Case discussed with Dr. Wilson soon after the resident saw the patient.  We reviewed the resident's history and exam and pertinent patient test results.  I agree with the assessment, diagnosis, and plan of care documented in the resident's note.  

## 2014-12-12 NOTE — Addendum Note (Signed)
Addended by: Lalla Brothers T on: 12/12/2014 02:02 PM   Modules accepted: Level of Service

## 2014-12-29 ENCOUNTER — Encounter (HOSPITAL_COMMUNITY): Payer: Self-pay | Admitting: *Deleted

## 2014-12-29 ENCOUNTER — Emergency Department (HOSPITAL_COMMUNITY)
Admission: EM | Admit: 2014-12-29 | Discharge: 2014-12-29 | Disposition: A | Discharge status: Home / Self Care | Payer: Medicaid Other | Attending: Emergency Medicine | Admitting: Emergency Medicine

## 2014-12-29 ENCOUNTER — Emergency Department (HOSPITAL_COMMUNITY): Payer: Medicaid Other

## 2014-12-29 DIAGNOSIS — R011 Cardiac murmur, unspecified: Secondary | ICD-10-CM | POA: Insufficient documentation

## 2014-12-29 DIAGNOSIS — Z7984 Long term (current) use of oral hypoglycemic drugs: Secondary | ICD-10-CM | POA: Insufficient documentation

## 2014-12-29 DIAGNOSIS — E785 Hyperlipidemia, unspecified: Secondary | ICD-10-CM | POA: Diagnosis not present

## 2014-12-29 DIAGNOSIS — Z880 Allergy status to penicillin: Secondary | ICD-10-CM | POA: Diagnosis not present

## 2014-12-29 DIAGNOSIS — W260XXA Contact with knife, initial encounter: Secondary | ICD-10-CM | POA: Insufficient documentation

## 2014-12-29 DIAGNOSIS — I10 Essential (primary) hypertension: Secondary | ICD-10-CM | POA: Insufficient documentation

## 2014-12-29 DIAGNOSIS — Z7982 Long term (current) use of aspirin: Secondary | ICD-10-CM | POA: Insufficient documentation

## 2014-12-29 DIAGNOSIS — S61211A Laceration without foreign body of left index finger without damage to nail, initial encounter: Secondary | ICD-10-CM | POA: Diagnosis not present

## 2014-12-29 DIAGNOSIS — E119 Type 2 diabetes mellitus without complications: Secondary | ICD-10-CM | POA: Insufficient documentation

## 2014-12-29 DIAGNOSIS — Z8742 Personal history of other diseases of the female genital tract: Secondary | ICD-10-CM | POA: Insufficient documentation

## 2014-12-29 DIAGNOSIS — Y9389 Activity, other specified: Secondary | ICD-10-CM | POA: Insufficient documentation

## 2014-12-29 DIAGNOSIS — Y9289 Other specified places as the place of occurrence of the external cause: Secondary | ICD-10-CM | POA: Diagnosis not present

## 2014-12-29 DIAGNOSIS — Z79899 Other long term (current) drug therapy: Secondary | ICD-10-CM | POA: Diagnosis not present

## 2014-12-29 DIAGNOSIS — S61219A Laceration without foreign body of unspecified finger without damage to nail, initial encounter: Secondary | ICD-10-CM

## 2014-12-29 DIAGNOSIS — Q210 Ventricular septal defect: Secondary | ICD-10-CM | POA: Diagnosis not present

## 2014-12-29 DIAGNOSIS — H9190 Unspecified hearing loss, unspecified ear: Secondary | ICD-10-CM | POA: Diagnosis not present

## 2014-12-29 DIAGNOSIS — S6992XA Unspecified injury of left wrist, hand and finger(s), initial encounter: Secondary | ICD-10-CM | POA: Diagnosis present

## 2014-12-29 DIAGNOSIS — Z8709 Personal history of other diseases of the respiratory system: Secondary | ICD-10-CM | POA: Diagnosis not present

## 2014-12-29 DIAGNOSIS — Y998 Other external cause status: Secondary | ICD-10-CM | POA: Insufficient documentation

## 2014-12-29 DIAGNOSIS — E669 Obesity, unspecified: Secondary | ICD-10-CM | POA: Insufficient documentation

## 2014-12-29 MED ORDER — TETANUS-DIPHTH-ACELL PERTUSSIS 5-2.5-18.5 LF-MCG/0.5 IM SUSP
0.5000 mL | Freq: Once | INTRAMUSCULAR | Status: AC
  Administered 2014-12-29: 0.5 mL via INTRAMUSCULAR
  Filled 2014-12-29: qty 0.5

## 2014-12-29 MED ORDER — LIDOCAINE HCL 2 % IJ SOLN
10.0000 mL | Freq: Once | INTRAMUSCULAR | Status: AC
  Administered 2014-12-29: 200 mg via INTRADERMAL
  Filled 2014-12-29: qty 20

## 2014-12-29 NOTE — ED Notes (Signed)
Paged Deaf interpreter for PT care.

## 2014-12-29 NOTE — ED Notes (Signed)
Wound care and dressing completed. 

## 2014-12-29 NOTE — Discharge Instructions (Signed)
Tylenol or Motrin for pain. Apply triple antibiotic ointment 2 times a day to the laceration. Keep it clean and dry. You can wash it with soap and water. Follow-up for sutures removal in 7 days. Return if any issues.   Laceration Care, Adult A laceration is a cut that goes through all of the layers of the skin and into the tissue that is right under the skin. Some lacerations heal on their own. Others need to be closed with stitches (sutures), staples, skin adhesive strips, or skin glue. Proper laceration care minimizes the risk of infection and helps the laceration to heal better. HOW TO CARE FOR YOUR LACERATION If sutures or staples were used:  Keep the wound clean and dry.  If you were given a bandage (dressing), you should change it at least one time per day or as told by your health care provider. You should also change it if it becomes wet or dirty.  Keep the wound completely dry for the first 24 hours or as told by your health care provider. After that time, you may shower or bathe. However, make sure that the wound is not soaked in water until after the sutures or staples have been removed.  Clean the wound one time each day or as told by your health care provider:  Wash the wound with soap and water.  Rinse the wound with water to remove all soap.  Pat the wound dry with a clean towel. Do not rub the wound.  After cleaning the wound, apply a thin layer of antibiotic ointmentas told by your health care provider. This will help to prevent infection and keep the dressing from sticking to the wound.  Have the sutures or staples removed as told by your health care provider. If skin adhesive strips were used:  Keep the wound clean and dry.  If you were given a bandage (dressing), you should change it at least one time per day or as told by your health care provider. You should also change it if it becomes dirty or wet.  Do not get the skin adhesive strips wet. You may shower or  bathe, but be careful to keep the wound dry.  If the wound gets wet, pat it dry with a clean towel. Do not rub the wound.  Skin adhesive strips fall off on their own. You may trim the strips as the wound heals. Do not remove skin adhesive strips that are still stuck to the wound. They will fall off in time. If skin glue was used:  Try to keep the wound dry, but you may briefly wet it in the shower or bath. Do not soak the wound in water, such as by swimming.  After you have showered or bathed, gently pat the wound dry with a clean towel. Do not rub the wound.  Do not do any activities that will make you sweat heavily until the skin glue has fallen off on its own.  Do not apply liquid, cream, or ointment medicine to the wound while the skin glue is in place. Using those may loosen the film before the wound has healed.  If you were given a bandage (dressing), you should change it at least one time per day or as told by your health care provider. You should also change it if it becomes dirty or wet.  If a dressing is placed over the wound, be careful not to apply tape directly over the skin glue. Doing that may cause  the glue to be pulled off before the wound has healed.  Do not pick at the glue. The skin glue usually remains in place for 5-10 days, then it falls off of the skin. General Instructions  Take over-the-counter and prescription medicines only as told by your health care provider.  If you were prescribed an antibiotic medicine or ointment, take or apply it as told by your doctor. Do not stop using it even if your condition improves.  To help prevent scarring, make sure to cover your wound with sunscreen whenever you are outside after stitches are removed, after adhesive strips are removed, or when glue remains in place and the wound is healed. Make sure to wear a sunscreen of at least 30 SPF.  Do not scratch or pick at the wound.  Keep all follow-up visits as told by your health  care provider. This is important.  Check your wound every day for signs of infection. Watch for:  Redness, swelling, or pain.  Fluid, blood, or pus.  Raise (elevate) the injured area above the level of your heart while you are sitting or lying down, if possible. SEEK MEDICAL CARE IF:  You received a tetanus shot and you have swelling, severe pain, redness, or bleeding at the injection site.  You have a fever.  A wound that was closed breaks open.  You notice a bad smell coming from your wound or your dressing.  You notice something coming out of the wound, such as wood or glass.  Your pain is not controlled with medicine.  You have increased redness, swelling, or pain at the site of your wound.  You have fluid, blood, or pus coming from your wound.  You notice a change in the color of your skin near your wound.  You need to change the dressing frequently due to fluid, blood, or pus draining from the wound.  You develop a new rash.  You develop numbness around the wound. SEEK IMMEDIATE MEDICAL CARE IF:  You develop severe swelling around the wound.  Your pain suddenly increases and is severe.  You develop painful lumps near the wound or on skin that is anywhere on your body.  You have a red streak going away from your wound.  The wound is on your hand or foot and you cannot properly move a finger or toe.  The wound is on your hand or foot and you notice that your fingers or toes look pale or bluish.   This information is not intended to replace advice given to you by your health care provider. Make sure you discuss any questions you have with your health care provider.   Document Released: 12/21/2004 Document Revised: 05/07/2014 Document Reviewed: 12/17/2013 Elsevier Interactive Patient Education [employer].

## 2014-12-29 NOTE — ED Notes (Signed)
PT reports she was cutting a grape fruit and cut my LT index finger with knife. Pt reports there was lots of bleeding at home. Bleeding controlled on arrival to ED.

## 2014-12-29 NOTE — ED Notes (Signed)
Deaf interpreter has been requested. Interpreter will be here by 7:15.

## 2014-12-29 NOTE — ED Provider Notes (Signed)
CSN: [id number]     Arrival date & time 12/29/14  1747 History  By signing my name below, I, Helane Gunther, attest that this documentation has been prepared under the direction and in the presence of [employer], PA-C. Electronically Signed: Helane Gunther, ED Scribe. 12/29/2014. 7:17 PM.    Chief Complaint  Patient presents with  . Laceration   The history is provided by the patient. No language interpreter was used.   HPI Comments: Janice Massey is a 59 y.o. 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 Pt states in writing that she was cutting a grapefruit with a knife when the blade slipped and lacerated her finger. She reports associated pain to the entire finger, and excessive bleeding from the wound. She notes her tetanus is no longer up to date.  Pt is allergic to codeine, lisinopril, and penicillins.   Past Medical History  Diagnosis Date  . Diabetes mellitus   . Hypertension   . Back pain   . Chronic PID   . Ventricular septal defect 2004 per echo    small membranous VSD  . Mute   . Deaf     Since age 52  . Atypical chest pain     myoview 10/29/10 - Post-stress EF 56%. Low risk and negative for ischemia   . Obese   . Dyslipidemia   . Cardiac murmur     small membranous VSD with fairly loud cardiac murmur (asymptomatic)   . Lightheadedness   . Dizziness   . Dyspnea   . History of Doppler ultrasound 06/20/09    Normal, no prior studies for comparison.   . Chronic PID (chronic pelvic inflammatory disease)   . Back pain   . URI (upper respiratory infection)    Past Surgical History  Procedure Laterality Date  . Cholecystectomy  11/04/1993  . Tubal ligation  10/05/1978  . Colposcopy    . Cardiac catheterization  08/23/95   Family History  Problem Relation Age of Onset  .  Cancer Mother     liver  . Heart attack Mother 62  . Hypertension Father   . Diabetes Father   . Heart attack Father   . CAD Father 62  . Diabetes Paternal Aunt   . Diabetes Maternal Grandfather    Social History  Substance Use Topics  . Smoking status: Never Smoker   . Smokeless tobacco: Never Used  . Alcohol Use: No   OB History    Gravida Para Term Preterm AB TAB SAB Ectopic Multiple Living   2 2 2       2      Review of Systems  Constitutional: Negative for fever.  Musculoskeletal: Positive for myalgias.  Skin: Positive for wound.    Allergies  Codeine; Lisinopril; and Penicillins  Home Medications   Prior to Admission medications   Medication Sig Start Date End Date Taking? Authorizing Provider  aspirin 81 MG tablet Take 81 mg by mouth every morning.    Historical Provider, MD  atorvastatin (LIPITOR) 40 MG tablet Take 1 tablet (40 mg total) by mouth daily. 10/02/14   Josue Hector, MD  Calcium Carbonate Antacid (TUMS PO) Take 1 tablet by mouth daily.    Historical Provider, MD  hydrochlorothiazide (HYDRODIURIL) 25 MG tablet Take 1 tablet (25 mg total) by  mouth daily. 10/02/14   Josue Hector, MD  losartan (COZAAR) 50 MG tablet Take 2 tablets (100 mg total) by mouth daily. 10/02/14   Josue Hector, MD  metFORMIN (GLUCOPHAGE) 1000 MG tablet Take 1 tablet (1,000 mg total) by mouth 2 (two) times daily. 11/16/14   Francesca Oman, DO   BP 132/76 mmHg  Pulse 69  Temp(Src) 98.2 F (36.8 C) (Oral)  Resp 14  SpO2 99% Physical Exam  Constitutional: She appears well-developed and well-nourished.  HENT:  Head: Normocephalic and atraumatic.  Eyes: Conjunctivae are normal. Right eye exhibits no discharge. Left eye exhibits no discharge.  Pulmonary/Chest: Effort normal. No respiratory distress.  Musculoskeletal:  2 cm laceration to the left index finger, over palmar surface of distal phalanx. Refill less than 2 seconds distally. Full range of motion of the finger. Tenderness  to palpation over the finger diffusely.  Neurological: She is alert. Coordination normal.  Skin: Skin is warm and dry. No rash noted. She is not diaphoretic. No erythema.  Psychiatric: She has a normal mood and affect.  Nursing note and vitals reviewed.   ED Course  Procedures  DIAGNOSTIC STUDIES: Oxygen Saturation is 99% on RA, normal by my interpretation.    COORDINATION OF CARE: 6:17 PM - Discussed plans to order diagnostic imaging and a Tdap. Will numb, cleanse, and suture the wound. Pt advised of plan for treatment and pt agrees.  LACERATION REPAIR PROCEDURE NOTE The patient's identification was confirmed and consent was obtained. This procedure was performed by Jeannett Senior, PA-C at 7:17 PM. Site: Left index finger  Sterile procedures observed Anesthetic used (type and amt): Lidocaine 2% injection 200 mg  Suture type/size:prolene Length:2cm # of Sutures: 4 Technique:simple interrupted Complexitysimple Antibx ointment applied Tetanus ordered Site anesthetized, irrigated with NS, explored without evidence of foreign body, wound well approximated, site covered with dry, sterile dressing.  Patient tolerated procedure well without complications. Instructions for care discussed verbally and patient provided with additional written instructions for homecare and f/u.  Labs Review Labs Reviewed - No data to display  Imaging Review Dg Finger Index Left  12/29/2014  CLINICAL DATA:  Laceration.  Finger pain EXAM: LEFT INDEX FINGER 2+V COMPARISON:  None. FINDINGS: There is no evidence of fracture or dislocation. There is no evidence of arthropathy or other focal bone abnormality. Soft tissues are unremarkable. IMPRESSION: Negative. Electronically Signed   By: Franchot Gallo M.D.   On: 12/29/2014 19:05   I have personally reviewed and evaluated these images and lab results as part of my medical decision-making.   EKG Interpretation None      MDM   Final diagnoses:   Laceration of finger of left hand, initial encounter    Patient is deaf, upon her arrival, deaf interpreter was called. I communicated with patient through writing. Patient cut her left index finger on the knife while cutting a grapefruit. X-ray obtained which showed no bony abnormality or foreign body. Wound repaired with sutures. By the time patient was done with my evaluation and treatment, deaf interpreter still did not show up. I made sure patient understood all the instructions through writing, patient did not wish to wait for interpreter. She was discharged home with wound care instructions and suture removal instructions. Tetanus was updated.  Filed Vitals:   12/29/14 1756 12/29/14 1947  BP: 132/76 129/79  Pulse: 69 65  Temp: 98.2 F (36.8 C) 98.1 F (36.7 C)  TempSrc: Oral   Resp: 14 16  SpO2: 99%  99%      Jeannett Senior, PA-C 12/31/14 0151  Milton Ferguson, MD 01/02/15 1025

## 2014-12-30 ENCOUNTER — Other Ambulatory Visit: Payer: Self-pay | Admitting: Internal Medicine

## 2014-12-31 DIAGNOSIS — Z79899 Other long term (current) drug therapy: Secondary | ICD-10-CM | POA: Insufficient documentation

## 2014-12-31 DIAGNOSIS — R21 Rash and other nonspecific skin eruption: Secondary | ICD-10-CM | POA: Insufficient documentation

## 2014-12-31 DIAGNOSIS — Z8719 Personal history of other diseases of the digestive system: Secondary | ICD-10-CM | POA: Insufficient documentation

## 2015-01-01 ENCOUNTER — Encounter (HOSPITAL_COMMUNITY): Payer: Self-pay | Admitting: Emergency Medicine

## 2015-01-01 ENCOUNTER — Emergency Department (HOSPITAL_COMMUNITY)
Admission: EM | Admit: 2015-01-01 | Discharge: 2015-01-01 | Disposition: A | Discharge status: Home / Self Care | Payer: Self-pay | Attending: Emergency Medicine | Admitting: Emergency Medicine

## 2015-01-01 ENCOUNTER — Other Ambulatory Visit: Payer: Self-pay | Admitting: Internal Medicine

## 2015-01-01 DIAGNOSIS — R21 Rash and other nonspecific skin eruption: Secondary | ICD-10-CM

## 2015-01-01 MED ORDER — PERMETHRIN 5 % EX CREA: TOPICAL_CREAM | CUTANEOUS | Status: DC

## 2015-01-01 MED ORDER — TETANUS-DIPHTH-ACELL PERTUSSIS 5-2.5-18.5 LF-MCG/0.5 IM SUSP: 0.5000 mL | Freq: Once | INTRAMUSCULAR | Status: DC

## 2015-01-01 MED ORDER — PREDNISONE 20 MG PO TABS
40.0000 mg | ORAL_TABLET | Freq: Once | ORAL | Status: AC
  Administered 2015-01-01: 40 mg via ORAL
  Filled 2015-01-01: qty 2

## 2015-01-01 MED ORDER — PREDNISONE 20 MG PO TABS: 40.0000 mg | ORAL_TABLET | Freq: Every day | ORAL | Status: DC

## 2015-01-01 NOTE — Telephone Encounter (Signed)
Pt has to call [facility] to transfer Metformin rx from [facility] herself - called pt but mailbox is fulled.

## 2015-01-01 NOTE — Telephone Encounter (Signed)
Pt requesting metformin to be filled @ [facility].

## 2015-01-01 NOTE — ED Notes (Signed)
No distress.  See triage rns note.  Pa seeing pt at present

## 2015-01-01 NOTE — ED Notes (Signed)
Pt. Reports persistent itchy skin rashes at both arms onset last week unrelieved by OTC Calamine lotion / Hydrocortizone cream.

## 2015-01-01 NOTE — Discharge Instructions (Signed)
Please follow with your primary care doctor in the next 2 days for a check-up. They must obtain records for further management.  ° °Do not hesitate to return to the Emergency Department for any new, worsening or concerning symptoms.  ° °

## 2015-01-01 NOTE — ED Provider Notes (Signed)
CSN: [id number]     Arrival date & time 12/31/14  2357 History   First MD Initiated Contact with Patient 01/01/15 0007     Chief Complaint  Patient presents with  . Rash     (Consider location/radiation/quality/duration/timing/severity/associated sxs/prior Treatment) HPI   Blood pressure 130/84, pulse 60, temperature 97.9 F (36.6 C), temperature source Oral, resp. rate 18, height 5\' 4"  (1.626 m), weight 77.565 kg, SpO2 99 %.  Brandi Mendez is a 59 y.o. female complaining of paretic rash to bilateral upper extremities onset 1 week ago. Patient states that the itching is not relieved by calamine or hydrocortisone ointment. No other people in house are affected. States that the itching gets worse at night. Denies fever, chills, nausea, vomiting, new soaps, lotions, pets, medications. Last tetanus shot is unknown.    Past Medical History  Diagnosis Date  . Small bowel obstruction (HCC) 2013, 2015   Past Surgical History  Procedure Laterality Date  [facility]  . Cholecystectomy    . Abdominal hernia repair    . Left ankle surgery     No family history on file. Social History  Substance Use Topics  . Smoking status: Never Smoker   . Smokeless tobacco: None  . Alcohol Use: Yes     Comment: sometimes   OB History    No data available     Review of Systems  10 systems reviewed and found to be negative, except as noted in the HPI.   Allergies  Review of patient's allergies indicates no known allergies.  Home Medications   Prior to Admission medications   Medication Sig Start Date End Date Taking? Authorizing Provider  acetaminophen (TYLENOL) 500 MG tablet Take 1,000 mg by mouth every 6 (six) hours as needed for headache.    Historical Provider, MD  dicyclomine (BENTYL) 20 MG tablet Take 1 tablet (20 mg total) by mouth 2 (two) times daily. 08/03/14   Kaitlyn Szekalski, PA-C  nitroGLYCERIN (NITROSTAT) 0.4 MG SL tablet Place 1 tablet (0.4 mg  total) under the tongue every 5 (five) minutes as needed for chest pain. Patient not taking: Reported on 05/21/2014 10/05/12   Elenora GammaSamuel L Bradshaw, MD  omeprazole (PRILOSEC) 20 MG capsule Take 1 capsule (20 mg total) by mouth daily. 06/11/14   Stephanie Couphristopher M Street, MD  ondansetron (ZOFRAN ODT) 4 MG disintegrating tablet Take 1 tablet (4 mg total) by mouth every 8 (eight) hours as needed for nausea or vomiting. 08/03/14   Emilia BeckKaitlyn Szekalski, PA-C  polyethylene glycol powder (GLYCOLAX/MIRALAX) powder Take 17 g by mouth 2 (two) times daily as needed. 06/11/14   Stephanie Couphristopher M Street, MD  promethazine (PHENERGAN) 25 MG tablet Take 1 tablet (25 mg total) by mouth every 6 (six) hours as needed for nausea or vomiting. Patient not taking: Reported on 05/21/2014 05/19/14   Charm RingsErin J Honig, MD   BP 130/84 mmHg  Pulse 60  Temp(Src) 97.9 F (36.6 C) (Oral)  Resp 18  Ht 5\' 4"  (1.626 m)  Wt 77.565 kg  BMI 29.34 kg/m2  SpO2 99% Physical Exam  Constitutional: She is oriented to person, place, and time. She appears well-developed and well-nourished. No distress.  HENT:  Head: Normocephalic and atraumatic.  Mouth/Throat: Oropharynx is clear and moist.  Eyes: Conjunctivae and EOM are normal. Pupils are equal, round, and reactive to light.  Cardiovascular: Normal rate, regular rhythm and intact distal pulses.   Pulmonary/Chest: Effort normal and breath sounds normal.  No stridor. No respiratory distress. She has no wheezes. She has no rales. She exhibits no tenderness.  Abdominal: Soft. Bowel sounds are normal. She exhibits no distension and no mass. There is no tenderness. There is no rebound and no guarding.  Musculoskeletal: Normal range of motion.  Neurological: She is alert and oriented to person, place, and time.  Skin: Rash noted.  Mildly excoriated papular lesions to bilateral upper extremities, no warmth, discharge, tenderness to palpation. Lesions spare the palms soles and mucous membranes.  Psychiatric: She has  a normal mood and affect.  Nursing note and vitals reviewed.   ED Course  Procedures (including critical care time) Labs Review Labs Reviewed - No data to display  Imaging Review No results found. I have personally reviewed and evaluated these images and lab results as part of my medical decision-making.   EKG Interpretation None      MDM   Final diagnoses:  None    Filed Vitals:   01/01/15 0003  BP: 130/84  Pulse: 60  Temp: 97.9 F (36.6 C)  TempSrc: Oral  Resp: 18  Height:  (1.626 m)  Weight: 77.565 kg  SpO2: 99%    Medications  predniSONE (DELTASONE) tablet 40 mg (40 mg Oral Given 01/01/15 0039)    Brandi Mendez is 59 y.o. female presenting with pruritic lesions to bilateral upper extremities worsening over the course of a week, no signs of secondary infection. Possibly scabies versus allergic reaction. Patient will be started on permethrin, prednisone for comfort. Advise close follow-up with pediatrician.  Evaluation does not show pathology that would require ongoing emergent intervention or inpatient treatment. Pt is hemodynamically stable and mentating appropriately. Discussed findings and plan with patient/guardian, who agrees with care plan. All questions answered. Return precautions discussed and outpatient follow up given.   Discharge Medication List as of 01/01/2015 12:36 AM    START taking these medications   Details  permethrin (ELIMITE) 5 % cream Apply to entire body other than face - let sit for 14 hours then wash off, may repeat in 1 week if still having symptoms, Print    predniSONE (DELTASONE) 20 MG tablet Take 2 tablets (40 mg total) by mouth daily., Starting 01/01/2015, Until Discontinued, Print             Wynetta Emery, PA-C 01/01/15 0101  Leta Baptist, MD 01/03/15 [phone]

## 2015-01-02 NOTE — Telephone Encounter (Signed)
Stated she called and tranferred rx.

## 2015-01-03 ENCOUNTER — Ambulatory Visit (INDEPENDENT_AMBULATORY_CARE_PROVIDER_SITE_OTHER): Payer: Self-pay | Admitting: Family Medicine

## 2015-01-03 VITALS — BP 126/76 | HR 82 | Temp 97.8°F | Wt 166.0 lb

## 2015-01-03 DIAGNOSIS — R21 Rash and other nonspecific skin eruption: Secondary | ICD-10-CM

## 2015-01-03 NOTE — Assessment & Plan Note (Signed)
Slightly is a bug bite as she has cats and dogs at all. No one else with similar symptoms and doesn't suggest a contact dermatitis Does not suggest to be a scabies infection so we'll not continue the permethrin cream She will continue steroids which should take care of it -If the rash does not go away advise that she follow up for punch biopsy

## 2015-01-03 NOTE — Progress Notes (Signed)
Subjective:    Brandi Mendez - 59 y.o. female MRN [id number]  Date of birth: 09-19-1955  HPI  Brandi Mendez is here for RASH  Was evaluated to ED on 12/28 and was given prednisone and permethrin  She was unable to afford the permethrin cream  They were first little round red bumps.  Had rash for 14 days. Location: only occurring on her arms  Medications tried: as above  Similar rash in past: no prior history  New medications or antibiotics: no Tick, Insect or new pet exposure: no Recent travel: no New detergent or soap: no Immunocompromised: no No one else at home with similar symptoms  Has a dog but stays in the back yard  No history of eczema.   Symptoms Itching: yes Pain over rash: no Feeling ill all over: no Fever: nono Mouth sores: no Face or tongue swelling: no Trouble breathing: no Joint swelling or pain: no  Health Maintenance:  - flu vaccine today  Health Maintenance Due  Topic Date Due  . Hepatitis C Screening  1955-04-04  . COLONOSCOPY  01/28/2006  . PAP SMEAR  08/19/2012  . INFLUENZA VACCINE  08/05/2014    -  reports that she has never smoked. She does not have any smokeless tobacco history on file. - Review of Systems: Per HPI. - Past Medical History: Patient Active Problem List   Diagnosis Date Noted  . Rash 01/03/2015  . Abdominal pain, unspecified site 08/16/2012  . Periauricular adenopathy 07/28/2012  . Dyspepsia 06/01/2012  . BREAST PAIN, LEFT 04/16/2009  . Constipation 01/13/2009  . HYPERTRIGLYCERIDEMIA, MILD 12/26/2006  . OBESITY, UNSPECIFIED 12/26/2006   - Medications: reviewed and updated Current Outpatient Prescriptions  Medication Sig Dispense Refill  . acetaminophen (TYLENOL) 500 MG tablet Take 1,000 mg by mouth every 6 (six) hours as needed for headache.    . dicyclomine (BENTYL) 20 MG tablet Take 1 tablet (20 mg total) by mouth 2 (two) times daily. 20 tablet 0  . nitroGLYCERIN (NITROSTAT) 0.4 MG SL tablet Place 1 tablet (0.4 mg  total) under the tongue every 5 (five) minutes as needed for chest pain. (Patient not taking: Reported on 05/21/2014) 20 tablet 3  . omeprazole (PRILOSEC) 20 MG capsule Take 1 capsule (20 mg total) by mouth daily. 30 capsule 3  . ondansetron (ZOFRAN ODT) 4 MG disintegrating tablet Take 1 tablet (4 mg total) by mouth every 8 (eight) hours as needed for nausea or vomiting. 10 tablet 0  . permethrin (ELIMITE) 5 % cream Apply to entire body other than face - let sit for 14 hours then wash off, may repeat in 1 week if still having symptoms 60 g 1  . polyethylene glycol powder (GLYCOLAX/MIRALAX) powder Take 17 g by mouth 2 (two) times daily as needed. 3350 g 1  . predniSONE (DELTASONE) 20 MG tablet Take 2 tablets (40 mg total) by mouth daily. 10 tablet 0  . promethazine (PHENERGAN) 25 MG tablet Take 1 tablet (25 mg total) by mouth every 6 (six) hours as needed for nausea or vomiting. (Patient not taking: Reported on 05/21/2014) 30 tablet 0   No current facility-administered medications for this visit.     Review of Systems See HPI     Objective:   Physical Exam BP 126/76 mmHg  Pulse 82  Temp(Src) 97.8 F (36.6 C) (Oral)  Wt 166 lb (75.297 kg) Gen: NAD, alert, cooperative with exam, well-appearingy Skin: lesions on her right and left upper extremity of varying locations  and scabbed over          Assessment & Plan:   Rash Slightly is a bug bite as she has cats and dogs at all. No one else with similar symptoms and doesn't suggest a contact dermatitis Does not suggest to be a scabies infection so we'll not continue the permethrin cream She will continue steroids which should take care of it -If the rash does not go away advise that she follow up for punch biopsy

## 2015-01-03 NOTE — Patient Instructions (Signed)
Thank you for coming in,   The prednisone should take care of everything.   If the rash doesn't go away completely then we could always do a skin punch biopsy to find out what it is.   Sign up for My Chart to have easy access to your labs results, and communication with your Primary care physician   Please feel free to call with any questions or concerns at any time, at [phone]. --Dr. Jordan LikesSchmitz

## 2015-01-08 ENCOUNTER — Encounter: Payer: Self-pay | Admitting: Pulmonary Disease

## 2015-01-08 ENCOUNTER — Ambulatory Visit (INDEPENDENT_AMBULATORY_CARE_PROVIDER_SITE_OTHER): Payer: Medicaid Other | Admitting: Pulmonary Disease

## 2015-01-08 VITALS — BP 139/73 | HR 68 | Temp 98.3°F | Ht 67.0 in | Wt 188.9 lb

## 2015-01-08 DIAGNOSIS — S61219D Laceration without foreign body of unspecified finger without damage to nail, subsequent encounter: Secondary | ICD-10-CM

## 2015-01-08 DIAGNOSIS — E119 Type 2 diabetes mellitus without complications: Secondary | ICD-10-CM | POA: Diagnosis not present

## 2015-01-08 DIAGNOSIS — S61219A Laceration without foreign body of unspecified finger without damage to nail, initial encounter: Secondary | ICD-10-CM | POA: Insufficient documentation

## 2015-01-08 LAB — GLUCOSE, CAPILLARY: Glucose-Capillary: 111 mg/dL — ABNORMAL HIGH (ref 65–99)

## 2015-01-08 MED ORDER — LOSARTAN POTASSIUM 50 MG PO TABS: 100.0000 mg | ORAL_TABLET | Freq: Every day | ORAL | Status: DC

## 2015-01-08 NOTE — Assessment & Plan Note (Addendum)
Assessment: Seen in the ED on 12/29/2014 for laceration to her left index finger. X-ray unremarkable. Laceration repaired with sutures. Given Tdap. Healing well with no signs of infection.  Plan: -Sutures removed with no complications. -Given instructions signs/symptoms to watch for including fever, vomiting, diarrhea, erythema, increased pain, purulent drainage.

## 2015-01-08 NOTE — Patient Instructions (Signed)
Your sutures were removed today.  Things to watch out for: -Fever with temperature over 100.5 -Vomiting -Diarrhea -Redness, swelling, purulent discharge (yellow, green, brown, cloudy, etc), or increased pain  If you experience any of those problems or have any questions, please call the clinic [phone].

## 2015-01-08 NOTE — Progress Notes (Signed)
   Subjective:    Patient ID: Janice Massey, female    DOB: [birth year], 60 y.o.   MRN: [id number]  HPI [name] is a 60 year old woman with history of diabetes type 2, hypertension, mute/deaf, hyperlipidemia, ventricular septal defect presenting for follow-up of finger laceration.  She was seen in the ED on 12/29/2014 for laceration to her left index finger. X-ray unremarkable. Laceration repaired with sutures. She was also given Tdap.  Review of Systems Constitutional: no fevers/chills Ears, nose, mouth, throat, and face: no cough Respiratory: no shortness of breath Cardiovascular: no chest pain Gastrointestinal: no nausea/vomiting, no abdominal pain, no constipation, no diarrhea Genitourinary: no dysuria, no hematuria Integument: no rash Neurological: no paresthesias, no weakness  Past Medical History  Diagnosis Date  . Diabetes mellitus   . Hypertension   . Back pain   . Chronic PID   . Ventricular septal defect 2004 per echo    small membranous VSD  . Mute   . Deaf     Since age 41  . Atypical chest pain     myoview 10/29/10 - Post-stress EF 56%. Low risk and negative for ischemia   . Obese   . Dyslipidemia   . Cardiac murmur     small membranous VSD with fairly loud cardiac murmur (asymptomatic)   . Lightheadedness   . Dizziness   . Dyspnea   . History of Doppler ultrasound 06/20/09    Normal, no prior studies for comparison.   . Chronic PID (chronic pelvic inflammatory disease)   . Back pain   . URI (upper respiratory infection)     Current Outpatient Prescriptions on File Prior to Visit  Medication Sig Dispense Refill  . aspirin 81 MG tablet Take 81 mg by mouth every morning.    Marland Kitchen atorvastatin (LIPITOR) 40 MG tablet Take 1 tablet (40 mg total) by mouth daily. 90 tablet 3  . Calcium Carbonate Antacid (TUMS PO) Take 1 tablet by mouth daily.    . hydrochlorothiazide (HYDRODIURIL) 25 MG tablet Take 1 tablet (25 mg total) by mouth daily. 90 tablet 3  .  losartan (COZAAR) 50 MG tablet Take 2 tablets (100 mg total) by mouth daily. 180 tablet 3  . metFORMIN (GLUCOPHAGE) 1000 MG tablet Take 1 tablet (1,000 mg total) by mouth 2 (two) times daily. 180 tablet 1   No current facility-administered medications on file prior to visit.    Today's Vitals   01/08/15 1112 01/08/15 1137  BP: 141/68 139/73  Pulse: 67 68  Temp: 98.3 F (36.8 C)   TempSrc: Oral   Height: 5\' 7"  (1.702 m)   Weight: 188 lb 14.4 oz (85.684 kg)   SpO2: 99%    Objective:  Physical Exam  Constitutional: No distress.  HENT:  Head: Normocephalic and atraumatic.  Eyes: Conjunctivae are normal.  Neck: Neck supple.  Cardiovascular: Normal rate and regular rhythm.   Pulmonary/Chest: Effort normal and breath sounds normal.  Musculoskeletal: She exhibits no tenderness.  Neurological: Coordination normal.  Skin: Skin is warm and dry. She is not diaphoretic.  2cm incision on distal left index finger clean/dry/intact without erythema, warmth, or drainage.   Assessment & Plan:  Please refer to problem based charting.

## 2015-01-08 NOTE — Progress Notes (Signed)
Internal Medicine Clinic Attending  Case discussed with Dr. Krall at the time of the visit.  We reviewed the resident's history and exam and pertinent patient test results.  I agree with the assessment, diagnosis, and plan of care documented in the resident's note.  

## 2015-01-08 NOTE — Assessment & Plan Note (Signed)
Assessment: CBG today 111. Remains well controlled.  Plan: Continue metformin 1000mg  BID.

## 2015-01-09 ENCOUNTER — Ambulatory Visit: Payer: Self-pay

## 2015-01-15 ENCOUNTER — Ambulatory Visit: Payer: Self-pay

## 2015-01-24 ENCOUNTER — Encounter (HOSPITAL_COMMUNITY): Payer: Self-pay | Admitting: *Deleted

## 2015-01-24 ENCOUNTER — Emergency Department (INDEPENDENT_AMBULATORY_CARE_PROVIDER_SITE_OTHER)
Admission: EM | Admit: 2015-01-24 | Discharge: 2015-01-24 | Disposition: A | Discharge status: Home / Self Care | Payer: Self-pay | Source: Home / Self Care | Attending: Family Medicine | Admitting: Family Medicine

## 2015-01-24 ENCOUNTER — Emergency Department (INDEPENDENT_AMBULATORY_CARE_PROVIDER_SITE_OTHER): Payer: Self-pay

## 2015-01-24 DIAGNOSIS — J069 Acute upper respiratory infection, unspecified: Secondary | ICD-10-CM

## 2015-01-24 MED ORDER — AZITHROMYCIN 250 MG PO TABS: ORAL_TABLET | ORAL | Status: DC

## 2015-01-24 MED ORDER — GUAIFENESIN-CODEINE 100-10 MG/5ML PO SYRP: 10.0000 mL | ORAL_SOLUTION | Freq: Four times a day (QID) | ORAL | Status: DC | PRN

## 2015-01-24 NOTE — ED Provider Notes (Signed)
CSN: [id number]     Arrival date & time 01/24/15  1434 History   None    Chief Complaint  Patient presents with  . URI   (Consider location/radiation/quality/duration/timing/severity/associated sxs/prior Treatment) Patient is a 60 y.o. female presenting with URI. The history is provided by the patient.  URI Presenting symptoms: congestion, cough and rhinorrhea   Presenting symptoms: no fever   Severity:  Mild Onset quality:  Gradual Duration:  2 weeks Progression:  Unchanged Chronicity:  New Ineffective treatments:  OTC medications Associated symptoms: no wheezing   Risk factors: sick contacts   Risk factors comment:  Grandson also sick.   Past Medical History  Diagnosis Date  . Small bowel obstruction (HCC) 2013, 2015   Past Surgical History  Procedure Laterality Date  . Abdominal hysterectomy      [facility]  . Cholecystectomy    . Abdominal hernia repair    . Left ankle surgery     History reviewed. No pertinent family history. Social History  Substance Use Topics  . Smoking status: Never Smoker   . Smokeless tobacco: None  . Alcohol Use: Yes     Comment: sometimes   OB History    No data available     Review of Systems  Constitutional: Negative.  Negative for fever.  HENT: Positive for congestion, postnasal drip and rhinorrhea.   Respiratory: Positive for cough. Negative for shortness of breath and wheezing.   Cardiovascular: Negative.   All other systems reviewed and are negative.   Allergies  Review of patient's allergies indicates no known allergies.  Home Medications   Prior to Admission medications   Medication Sig Start Date End Date Taking? Authorizing Provider  acetaminophen (TYLENOL) 500 MG tablet Take 1,000 mg by mouth every 6 (six) hours as needed for headache.    Historical Provider, MD  azithromycin (ZITHROMAX Z-PAK) 250 MG tablet Take as directed on pack 01/24/15   Linna Hoff, MD  dicyclomine (BENTYL) 20 MG tablet Take 1 tablet (20  mg total) by mouth 2 (two) times daily. 08/03/14   Kaitlyn Szekalski, PA-C  guaiFENesin-codeine (ROBITUSSIN AC) 100-10 MG/5ML syrup Take 10 mLs by mouth 4 (four) times daily as needed for cough. 01/24/15   Linna Hoff, MD  nitroGLYCERIN (NITROSTAT) 0.4 MG SL tablet Place 1 tablet (0.4 mg total) under the tongue every 5 (five) minutes as needed for chest pain. Patient not taking: Reported on 05/21/2014 10/05/12   Elenora Gamma, MD  omeprazole (PRILOSEC) 20 MG capsule Take 1 capsule (20 mg total) by mouth daily. 06/11/14   Stephanie Coup Street, MD  ondansetron (ZOFRAN ODT) 4 MG disintegrating tablet Take 1 tablet (4 mg total) by mouth every 8 (eight) hours as needed for nausea or vomiting. 08/03/14   Emilia Beck, PA-C  permethrin (ELIMITE) 5 % cream Apply to entire body other than face - let sit for 14 hours then wash off, may repeat in 1 week if still having symptoms 01/01/15   Joni Reining Pisciotta, PA-C  polyethylene glycol powder (GLYCOLAX/MIRALAX) powder Take 17 g by mouth 2 (two) times daily as needed. 06/11/14   Stephanie Coup Street, MD  predniSONE (DELTASONE) 20 MG tablet Take 2 tablets (40 mg total) by mouth daily. 01/01/15   Nicole Pisciotta, PA-C  promethazine (PHENERGAN) 25 MG tablet Take 1 tablet (25 mg total) by mouth every 6 (six) hours as needed for nausea or vomiting. Patient not taking: Reported on 05/21/2014 05/19/14   Charm Rings, MD  Meds Ordered and Administered this Visit  Medications - No data to display  BP 113/80 mmHg  Pulse 88  Temp(Src) 98.1 F (36.7 C) (Oral)  Resp 16  SpO2 97% No data found.   Physical Exam  Constitutional: She is oriented to person, place, and time. She appears well-developed and well-nourished. No distress.  HENT:  Head: Normocephalic.  Right Ear: External ear normal.  Left Ear: External ear normal.  Mouth/Throat: Oropharynx is clear and moist.  Eyes: Pupils are equal, round, and reactive to light.  Neck: Normal range of motion. Neck  supple.  Cardiovascular: Normal rate, normal heart sounds and intact distal pulses.   Pulmonary/Chest: Effort normal. She has no wheezes. She has rales.  Abdominal: Soft. Bowel sounds are normal.  Lymphadenopathy:    She has no cervical adenopathy.  Neurological: She is alert and oriented to person, place, and time.  Skin: Skin is warm and dry.  Nursing note and vitals reviewed.   ED Course  Procedures (including critical care time)  Labs Review Labs Reviewed - No data to display  Imaging Review Dg Chest 2 View  01/24/2015  CLINICAL DATA:  Sick for over 2 weeks.  Cough. EXAM: CHEST - 2 VIEW COMPARISON:  None. FINDINGS: The heart size is normal. Lungs are clear. The visualized soft tissues and bony thorax are unremarkable. IMPRESSION: Negative two view chest x-ray Electronically Signed   By: Marin Roberts M.D.   On: 01/24/2015 16:12   X-rays reviewed and report per radiologist.   Visual Acuity Review  Right Eye Distance:   Left Eye Distance:   Bilateral Distance:    Right Eye Near:   Left Eye Near:    Bilateral Near:         MDM   1. URI (upper respiratory infection)        Linna Hoff, MD 01/24/15 1626

## 2015-01-24 NOTE — ED Notes (Signed)
Pt  Reports  Symptoms  Of   Cough  Congested   Stuffy  Nose        Symptoms  X  Several  Weeks      pt  Appears  In no  Acute   Distress

## 2015-04-02 ENCOUNTER — Ambulatory Visit (INDEPENDENT_AMBULATORY_CARE_PROVIDER_SITE_OTHER): Payer: Medicaid Other | Admitting: Internal Medicine

## 2015-04-02 ENCOUNTER — Encounter: Payer: Self-pay | Admitting: Internal Medicine

## 2015-04-02 VITALS — BP 137/80 | HR 72 | Temp 97.7°F | Ht 67.0 in | Wt 191.4 lb

## 2015-04-02 DIAGNOSIS — Z79899 Other long term (current) drug therapy: Secondary | ICD-10-CM

## 2015-04-02 DIAGNOSIS — M79651 Pain in right thigh: Secondary | ICD-10-CM

## 2015-04-02 DIAGNOSIS — E785 Hyperlipidemia, unspecified: Secondary | ICD-10-CM

## 2015-04-02 DIAGNOSIS — I10 Essential (primary) hypertension: Secondary | ICD-10-CM | POA: Diagnosis not present

## 2015-04-02 DIAGNOSIS — E119 Type 2 diabetes mellitus without complications: Secondary | ICD-10-CM | POA: Diagnosis not present

## 2015-04-02 DIAGNOSIS — Z7984 Long term (current) use of oral hypoglycemic drugs: Secondary | ICD-10-CM | POA: Diagnosis not present

## 2015-04-02 DIAGNOSIS — Z1159 Encounter for screening for other viral diseases: Secondary | ICD-10-CM

## 2015-04-02 DIAGNOSIS — Z0000 Encounter for general adult medical examination without abnormal findings: Secondary | ICD-10-CM

## 2015-04-02 DIAGNOSIS — M25559 Pain in unspecified hip: Secondary | ICD-10-CM

## 2015-04-02 DIAGNOSIS — Z1231 Encounter for screening mammogram for malignant neoplasm of breast: Secondary | ICD-10-CM

## 2015-04-02 HISTORY — DX: Pain in unspecified hip: M25.559

## 2015-04-02 LAB — POCT GLYCOSYLATED HEMOGLOBIN (HGB A1C): Hemoglobin A1C: 7.3

## 2015-04-02 LAB — GLUCOSE, CAPILLARY: Glucose-Capillary: 112 mg/dL — ABNORMAL HIGH (ref 65–99)

## 2015-04-02 MED ORDER — LOSARTAN POTASSIUM 50 MG PO TABS: 50.0000 mg | ORAL_TABLET | Freq: Every day | ORAL | Status: DC

## 2015-04-02 NOTE — Patient Instructions (Addendum)
1. I will call you if there are problems with your blood work.  Please try Tylenol 500mg  every 6 hours if needed for your leg pain.  Also, try ice or heat (not directly on skin) applied to leg.  If Tylenol does not seem to be working for you, please try ibuprofen or Aleve (take with food) for 1-2 weeks.  If you are still having pain after 3-4 weeks please come back to clinic and we may need to try other treatment and/or obtain imaging.  For your diabetes, please continue to take Metformin twice per day.  Also, congratulations on starting to walk for exercise.  This will help with weight loss and control of diabetes and blood pressure.  Please try to cut out sugar sweetened beverages (including chocolate milk and juices) because these add sugar and calories.    2. Please take all medications as prescribed.   3. If you have worsening of your symptoms or new symptoms arise, please call the clinic [phone]), or go to the ER immediately if symptoms are severe.  Hip Bursitis Bursitis is a swelling and soreness (inflammation) of a fluid-filled sac (bursa). This sac overlies and protects the joints.  CAUSES   Injury.  Overuse of the muscles surrounding the joint.  Arthritis.  Gout.  Infection.  Cold weather.  Inadequate warm-up and conditioning prior to activities. The cause may not be known.  SYMPTOMS   Mild to severe irritation.  Tenderness and swelling over the outside of the hip.  Pain with motion of the hip.  If the bursa becomes infected, a fever may be present. Redness, tenderness, and warmth will develop over the hip. Symptoms usually lessen in 3 to 4 weeks with treatment, but can come back. TREATMENT If conservative treatment does not work, your caregiver may advise draining the bursa and injecting cortisone into the area. This may speed up the healing process. This may also be used as an initial treatment of choice. HOME CARE INSTRUCTIONS   Apply ice to the affected area  for 15-20 minutes every 3 to 4 hours while awake for the first 2 days. Put the ice in a plastic bag and place a towel between the bag of ice and your skin.  Rest the painful joint as much as possible, but continue to put the joint through a normal range of motion at least 4 times per day. When the pain lessens, begin normal, slow movements and usual activities to help prevent stiffness of the hip.  Only take over-the-counter or prescription medicines for pain, discomfort, or fever as directed by your caregiver.  Use crutches to limit weight bearing on the hip joint, if advised.  Elevate your painful hip to reduce swelling. Use pillows for propping and cushioning your legs and hips.  Gentle massage may provide comfort and decrease swelling. SEEK IMMEDIATE MEDICAL CARE IF:   Your pain increases even during treatment, or you are not improving.  You have a fever.  You have heat and inflammation over the involved bursa.  You have any other questions or concerns. MAKE SURE YOU:   Understand these instructions.  Will watch your condition.  Will get help right away if you are not doing well or get worse.   This information is not intended to replace advice given to you by your health care provider. Make sure you discuss any questions you have with your health care provider.   Document Released: 06/12/2001 Document Revised: 03/15/2011 Document Reviewed: 07/23/2014 Elsevier Interactive Patient Education  2016 [place].

## 2015-04-02 NOTE — Assessment & Plan Note (Addendum)
Assessment:  Compliant with Lipitor.  40mg  dose is listed in EMR and was on last AVS from this office and cardiology office however, she has a bottle today with 80mg  dose (my name listed as prescriber and 02/21/15 fill date).  No ADRs from 80mg  dose.  Statin dose had been reduced from 80mg  to 40mg  (less likely ADRs) at cardiology visit in Sept and sent to [facility] new pharmacy ([facility]) but she [facility] filled at [facility] and was still getting old (80mg ) rx from [facility].  During visit, patient reported wanting all refills sent to [facility] so all her meds were at one place. Plan:  I called [facility] and cancelled 80mg  refills (and all other rx).  I called [facility] and 40mg  rx is still active (rx by Dr. Johnsie Cancel for #90 plus 3 refills) and they will prepare it for her.  I asked the pharmacy to advise her to STOP 80mg  tablets and take 40mg  instead.  I will also call her sister and ask that she communicate this change to the patient. Continue diet and exercise.

## 2015-04-02 NOTE — Assessment & Plan Note (Addendum)
Assessment:  Lateral thigh pain (sharp, tingling) radiating down leg just below knee.  Hurts to lay on the right side.  Pain better with topical aloe vera, warm shower, rest.  Pain with ambulation but does not interfere with her walking for exercise.  No back pain.  No preceding trauma but she had right knee surgery in 1993.  No rashes or swelling.  She has not tried any OTC pain relief, only aloe vera.  Tenderness to palpation of right greater troch and lateral thigh c/w trochanteric bursitis.  There is no muscles weakness or neuro abnormality.  There is no weight loss and age appropriate cancer screening is up to date.  Doubt meralgia paresthetica given description of pain (sharp not burning) and she denies numbness but I did advise to try looser fitting garments.  There is no rash to suggest Zoster.  I doubt statin myalgia which is usually bilateral.   Plan:  Try Tylenol q6h prn as needed, ice or heat, avoid laying on affected side and activities that aggravate the pain.  If Tylenol not affected, ok to try prn NSAID (with food) for a few weeks).  She will return to clinic if symptoms no better in 3-4 weeks.

## 2015-04-02 NOTE — Assessment & Plan Note (Addendum)
BP Readings from Last 3 Encounters:  04/02/15 137/80  01/08/15 139/73  12/29/14 129/79    Lab Results  Component Value Date   NA 143 08/07/2014   K 4.1 08/07/2014   CREATININE 0.78 08/07/2014    Assessment: Blood pressure control:  well controlled Progress toward BP goal:   at goal Comments: Compliant with medication.  Brought med bottles with her.  Losartan 50mg  daily (looks like previously 100mg  daily but she says she only takes the one 50mg  pill daily) and HCTZ 25mg  daily.  No ADRs.  She [facility] that [facility] because [facility] other [facility].  I checked and it seems that she has previously filled at [facility] but now [facility] are at [facility].  [facility] and, although losartan 100mg  has been sent to them she has not filled recently.  I called [facility] and she has filled losartan 50mg  daily for the past two months but they had several other losartan rx on file. I asked that they cancel all of her prescriptions (losartan and other medications) so there is no confusion in the future.  It appears that she is compliant but there have been several rx sent by multiple providers and old refills still approved at the two pharmacies which has lead to confusion regarding what dose she should be on.  She said she wanted all [facility] so I have sent new rx for losartan 50mg .   Plan: Medications:  continue current medications:  HCTZ 25mg  daily and losartan 50mg  daily (she has been on this dose for the past few months with good control). Educational resources provided: brochure (denies need ).  Encouraged increased physical activity (she has started a walking program with her boyfriend).   Self management tools provided:   Other plans: Continue walking.  BMP today.

## 2015-04-02 NOTE — Progress Notes (Signed)
Subjective:    Patient ID: Janice Massey, female    DOB: 12/09/1955, 60 y.o.   MRN: [id number]  HPI Comments: Janice Massey is a 60 year old woman with PMH as below here for follow-up of diabetes.  Please see problem based charting for the status of her chronic medical conditions.  Also, with right sided hip/knee pain (sharp, crampy, 8/10) x 2 months (no change in pain), no recent trauma but s/p right knee surgery in 1993.  No back pain or numbness.  Feels tingling and warm sometimes.  No skin rashes.     History obtained with the assistance of in person sign language interpreter.    Past Medical History  Diagnosis Date  . Diabetes mellitus   . Hypertension   . Back pain   . Chronic PID   . Ventricular septal defect 2004 per echo    small membranous VSD  . Mute   . Deaf     Since age 22  . Atypical chest pain     myoview 10/29/10 - Post-stress EF 56%. Low risk and negative for ischemia   . Obese   . Dyslipidemia   . Cardiac murmur     small membranous VSD with fairly loud cardiac murmur (asymptomatic)   . Lightheadedness   . Dizziness   . Dyspnea   . History of Doppler ultrasound 06/20/09    Normal, no prior studies for comparison.   . Chronic PID (chronic pelvic inflammatory disease)   . Back pain   . URI (upper respiratory infection)    Current Outpatient Prescriptions on File Prior to Visit  Medication Sig Dispense Refill  . aspirin 81 MG tablet Take 81 mg by mouth every morning.    Marland Kitchen atorvastatin (LIPITOR) 40 MG tablet Take 1 tablet (40 mg total) by mouth daily. 90 tablet 3  . Calcium Carbonate Antacid (TUMS PO) Take 1 tablet by mouth daily.    . hydrochlorothiazide (HYDRODIURIL) 25 MG tablet Take 1 tablet (25 mg total) by mouth daily. 90 tablet 3  . losartan (COZAAR) 50 MG tablet Take 2 tablets (100 mg total) by mouth daily. 180 tablet 3  . metFORMIN (GLUCOPHAGE) 1000 MG tablet Take 1 tablet (1,000 mg total) by mouth 2 (two) times daily. 180 tablet 1   No current  facility-administered medications on file prior to visit.    Review of Systems  Constitutional: Negative for fever, chills, appetite change and unexpected weight change.  Eyes: Negative for visual disturbance.  Respiratory: Negative for cough, shortness of breath and wheezing.   Cardiovascular: Negative for chest pain, palpitations and leg swelling.  Gastrointestinal: Negative for nausea, vomiting, abdominal pain, diarrhea, constipation and blood in stool.  Genitourinary: Negative for dysuria, hematuria, vaginal bleeding and difficulty urinating.  Musculoskeletal: Negative for back pain.  Neurological: Negative for syncope, light-headedness and headaches.       Filed Vitals:   04/02/15 1455  BP: 137/80  Pulse: 72  Temp: 97.7 F (36.5 C)  TempSrc: Oral  Height: 5\' 7"  (1.702 m)  Weight: 191 lb 6.4 oz (86.818 kg)  SpO2: 98%    Objective:   Physical Exam  Constitutional: She is oriented to person, place, and time. She appears well-developed. No distress.  HENT:  Head: Normocephalic and atraumatic.  Mouth/Throat: Oropharynx is clear and moist. No oropharyngeal exudate.  Eyes: Conjunctivae and EOM are normal. Pupils are equal, round, and reactive to light. Right eye exhibits no discharge. Left eye exhibits no discharge. No scleral icterus.  Neck: Neck supple. No thyromegaly present.  Cardiovascular: Normal rate, regular rhythm and intact distal pulses.  Exam reveals no gallop and no friction rub.   Murmur heard. Pulmonary/Chest: Effort normal and breath sounds normal. No respiratory distress. She has no wheezes. She has no rales.  Abdominal: Soft. Bowel sounds are normal. She exhibits no distension and no mass. There is no tenderness. There is no rebound and no guarding.  Musculoskeletal: Normal range of motion. She exhibits tenderness. She exhibits no edema.  Normal hip and knee ROM B/L. TTP of right troch and along right lateral thigh down to lateral knee.    Neurological: She is  alert and oriented to person, place, and time. She displays normal reflexes. No cranial nerve deficit. She exhibits normal muscle tone.  Neg straight leg raise B/L.  Skin: Skin is warm. No rash noted. She is not diaphoretic.  Psychiatric: She has a normal mood and affect. Her behavior is normal. Judgment and thought content normal.  Vitals reviewed.         Assessment & Plan:  Please see problem based charting for A&P.

## 2015-04-02 NOTE — Assessment & Plan Note (Signed)
Mammo:  Referral sent and patient scheduled at [facility]. Eye exam:  Last exam on file is from Jan 2016 but she says she has been within the past year.  Will send for records and determine if she is due. HCV:  She is in birth cohort for one time HCV testing.  She is agreeable to HCV testing today.

## 2015-04-02 NOTE — Assessment & Plan Note (Signed)
Lab Results  Component Value Date   HGBA1C 7.3 04/02/2015   HGBA1C 6.2 11/13/2014   HGBA1C 7.4 08/07/2014     Assessment: Diabetes control:  fair control Progress toward A1C goal:   worsened Comments: Reports compliance with Metformin 1000mg  BID and denies ADRs.  Admits to drinking chocolate milk and juices a few times per week but drinks mostly water.  Does not eat much pasta/bread.  Reports eating salads.  Walking for exercise - just started walking with her boyfriend this week.   Walking for about 2 hours per day.  Plan: Medications:  continue current medications:  Metformin 1000mg  BID Home glucose monitoring:  No Frequency:   Timing:   Instruction/counseling given: reminded to get eye exam, discussed the need for weight loss and discussed diet Educational resources provided: brochure (denies ) Self management tools provided:   Other plans: Encouraged her to continue walking program and aim for small amount of weight loss which can make a big difference in blood sugar control.  Advised she cut out liquid calories and excess sugar in chocolate milk and juice.  Check urine microalbumin next visit.  Continue ARB, statin, ASA.

## 2015-04-03 LAB — BMP8+ANION GAP
Anion Gap: 20 mmol/L — ABNORMAL HIGH (ref 10.0–18.0)
BUN/Creatinine Ratio: 19 (ref 9–23)
BUN: 14 mg/dL (ref 6–24)
CALCIUM: 9.5 mg/dL (ref 8.7–10.2)
CHLORIDE: 97 mmol/L (ref 96–106)
CO2: 26 mmol/L (ref 18–29)
Creatinine, Ser: 0.74 mg/dL (ref 0.57–1.00)
GFR, EST AFRICAN AMERICAN: 103 mL/min/{1.73_m2} (ref >59)
GFR, EST NON AFRICAN AMERICAN: 89 mL/min/{1.73_m2} (ref >59)
GLUCOSE: 97 mg/dL (ref 65–99)
POTASSIUM: 3.7 mmol/L (ref 3.5–5.2)
SODIUM: 143 mmol/L (ref 134–144)

## 2015-04-03 LAB — HEPATITIS C ANTIBODY: Hep C Virus Ab: 0.3 s/co ratio (ref 0.0–0.9)

## 2015-04-03 NOTE — Progress Notes (Signed)
Internal Medicine Clinic Attending  Case discussed with Dr. Wilson soon after the resident saw the patient.  We reviewed the resident's history and exam and pertinent patient test results.  I agree with the assessment, diagnosis, and plan of care documented in the resident's note.  

## 2015-04-07 ENCOUNTER — Telehealth: Payer: Self-pay | Admitting: *Deleted

## 2015-04-07 NOTE — Telephone Encounter (Signed)
Call to patients, sister's number to ask if she had gotten patient's medication and to explain change per order of Dr. Redmond Pulling.  Sister unavailable but spoke with female at the home who said that the sister was unable to get the Losartin as it needed a Prior Authorization.  Call to [facility] to verify the information.  Pharmacist there said that Medicaid requires a prior authorization for the Losartin and thatn a Prior Authorization request had been faxed to the Clinics.  Unable to locate the faxed request.  Spoke with Georges Mouse, Pharmacist in the Clinics . She will contact the [facility] to see why the prior authorization was needed.  Sander Nephew, RN 04/07/2015 6:19 PM.

## 2015-04-08 ENCOUNTER — Ambulatory Visit: Payer: Self-pay

## 2015-04-08 NOTE — Telephone Encounter (Signed)
Received PA request for pt's Losartan 50mg  tabs.  Requires trial and failure of an ACE-pt developed cough on lisinopril. Request submitted online via  Tracks and request was sent for review.Despina Hidden Cassady4/4/201711:20 AM   Request was approved through 04/07/2016-will inform pt's sister and pharmacy    [id number]  PHARMACY [id number] F Sharell Mcglamery 04/08/2015 APPROVED 04/08/2015 - 04/07/2016 DMA

## 2015-04-28 ENCOUNTER — Ambulatory Visit
Admission: RE | Admit: 2015-04-28 | Discharge: 2015-04-28 | Disposition: A | Discharge status: Home / Self Care | Payer: Medicaid Other | Source: Ambulatory Visit | Attending: Internal Medicine | Admitting: Internal Medicine

## 2015-04-28 DIAGNOSIS — Z1231 Encounter for screening mammogram for malignant neoplasm of breast: Secondary | ICD-10-CM

## 2015-04-30 ENCOUNTER — Encounter (HOSPITAL_COMMUNITY): Payer: Self-pay | Admitting: *Deleted

## 2015-04-30 ENCOUNTER — Emergency Department (HOSPITAL_COMMUNITY)
Admission: EM | Admit: 2015-04-30 | Discharge: 2015-04-30 | Disposition: A | Discharge status: Home / Self Care | Payer: Medicaid Other | Attending: Emergency Medicine | Admitting: Emergency Medicine

## 2015-04-30 ENCOUNTER — Emergency Department (HOSPITAL_COMMUNITY): Payer: Medicaid Other

## 2015-04-30 DIAGNOSIS — Z3202 Encounter for pregnancy test, result negative: Secondary | ICD-10-CM | POA: Diagnosis not present

## 2015-04-30 DIAGNOSIS — I10 Essential (primary) hypertension: Secondary | ICD-10-CM | POA: Diagnosis not present

## 2015-04-30 DIAGNOSIS — Z7982 Long term (current) use of aspirin: Secondary | ICD-10-CM | POA: Diagnosis not present

## 2015-04-30 DIAGNOSIS — R1033 Periumbilical pain: Secondary | ICD-10-CM | POA: Diagnosis not present

## 2015-04-30 DIAGNOSIS — Z79899 Other long term (current) drug therapy: Secondary | ICD-10-CM | POA: Diagnosis not present

## 2015-04-30 DIAGNOSIS — R1031 Right lower quadrant pain: Secondary | ICD-10-CM | POA: Diagnosis present

## 2015-04-30 DIAGNOSIS — E669 Obesity, unspecified: Secondary | ICD-10-CM | POA: Diagnosis not present

## 2015-04-30 DIAGNOSIS — E119 Type 2 diabetes mellitus without complications: Secondary | ICD-10-CM | POA: Diagnosis not present

## 2015-04-30 DIAGNOSIS — H9190 Unspecified hearing loss, unspecified ear: Secondary | ICD-10-CM | POA: Diagnosis not present

## 2015-04-30 DIAGNOSIS — R011 Cardiac murmur, unspecified: Secondary | ICD-10-CM | POA: Insufficient documentation

## 2015-04-30 DIAGNOSIS — Z880 Allergy status to penicillin: Secondary | ICD-10-CM | POA: Insufficient documentation

## 2015-04-30 LAB — COMPREHENSIVE METABOLIC PANEL
ALK PHOS: 68 U/L (ref 38–126)
ALT: 44 U/L (ref 14–54)
AST: 34 U/L (ref 15–41)
Albumin: 4.3 g/dL (ref 3.5–5.0)
Anion gap: 11 (ref 5–15)
BILIRUBIN TOTAL: 1.1 mg/dL (ref 0.3–1.2)
BUN: 9 mg/dL (ref 6–20)
CALCIUM: 10.1 mg/dL (ref 8.9–10.3)
CHLORIDE: 99 mmol/L — AB (ref 101–111)
CO2: 30 mmol/L (ref 22–32)
CREATININE: 0.68 mg/dL (ref 0.44–1.00)
Glucose, Bld: 147 mg/dL — ABNORMAL HIGH (ref 65–99)
Potassium: 3.4 mmol/L — ABNORMAL LOW (ref 3.5–5.1)
Sodium: 140 mmol/L (ref 135–145)
TOTAL PROTEIN: 7.7 g/dL (ref 6.5–8.1)

## 2015-04-30 LAB — URINALYSIS, ROUTINE W REFLEX MICROSCOPIC
BILIRUBIN URINE: NEGATIVE
GLUCOSE, UA: NEGATIVE mg/dL
HGB URINE DIPSTICK: NEGATIVE
Ketones, ur: NEGATIVE mg/dL
Nitrite: NEGATIVE
PH: 6 (ref 5.0–8.0)
Protein, ur: NEGATIVE mg/dL
SPECIFIC GRAVITY, URINE: 1.009 (ref 1.005–1.030)

## 2015-04-30 LAB — CBC
HCT: 38.5 % (ref 36.0–46.0)
HEMOGLOBIN: 12.6 g/dL (ref 12.0–15.0)
MCH: 28.3 pg (ref 26.0–34.0)
MCHC: 32.7 g/dL (ref 30.0–36.0)
MCV: 86.3 fL (ref 78.0–100.0)
Platelets: 266 10*3/uL (ref 150–400)
RBC: 4.46 MIL/uL (ref 3.87–5.11)
RDW: 13.3 % (ref 11.5–15.5)
WBC: 7.8 10*3/uL (ref 4.0–10.5)

## 2015-04-30 LAB — URINE MICROSCOPIC-ADD ON

## 2015-04-30 LAB — I-STAT BETA HCG BLOOD, ED (MC, WL, AP ONLY): I-stat hCG, quantitative: <5 m[IU]/mL (ref <5)

## 2015-04-30 LAB — LIPASE, BLOOD: Lipase: 24 U/L (ref 11–51)

## 2015-04-30 MED ORDER — KETOROLAC TROMETHAMINE 30 MG/ML IJ SOLN
30.0000 mg | Freq: Once | INTRAMUSCULAR | Status: AC
  Administered 2015-04-30: 30 mg via INTRAMUSCULAR
  Filled 2015-04-30: qty 1

## 2015-04-30 NOTE — ED Notes (Signed)
Interpretor has been called

## 2015-04-30 NOTE — ED Provider Notes (Signed)
CSN: [id number]     Arrival date & time 04/30/15  1305 History   First MD Initiated Contact with Patient 04/30/15 1704     Chief Complaint  Patient presents with  . Abdominal Pain     HPI Patient complaining of right lower quadrant pain for 2 weeks.  Some vomiting.  No fever chills.Patient is deaf in speaks through a interpreter.  Has had no definite dysuria.  No flank or back pain. Past Medical History  Diagnosis Date  . Diabetes mellitus   . Hypertension   . Back pain   . Chronic PID   . Ventricular septal defect 2004 per echo    small membranous VSD  . Mute   . Deaf     Since age 42  . Atypical chest pain     myoview 10/29/10 - Post-stress EF 56%. Low risk and negative for ischemia   . Obese   . Dyslipidemia   . Cardiac murmur     small membranous VSD with fairly loud cardiac murmur (asymptomatic)   . Lightheadedness   . Dizziness   . Dyspnea   . History of Doppler ultrasound 06/20/09    Normal, no prior studies for comparison.   . Chronic PID (chronic pelvic inflammatory disease)   . Back pain   . URI (upper respiratory infection)    Past Surgical History  Procedure Laterality Date  . Cholecystectomy  11/04/1993  . Tubal ligation  10/05/1978  . Colposcopy    . Cardiac catheterization  08/23/95   Family History  Problem Relation Age of Onset  . Cancer Mother     liver  . Heart attack Mother 30  . Hypertension Father   . Diabetes Father   . Heart attack Father   . CAD Father 20  . Diabetes Paternal Aunt   . Diabetes Maternal Grandfather    Social History  Substance Use Topics  . Smoking status: Never Smoker   . Smokeless tobacco: Never Used  . Alcohol Use: No   OB History    Gravida Para Term Preterm AB TAB SAB Ectopic Multiple Living   2 2 2       2      Review of Systems  Unable to perform ROS: Other      Allergies  Codeine; Lisinopril; and Penicillins  Home Medications   Prior to Admission medications   Medication Sig Start Date End Date  Taking? Authorizing Provider  aspirin 81 MG tablet Take 81 mg by mouth every morning.   Yes Historical Provider, MD  atorvastatin (LIPITOR) 40 MG tablet Take 1 tablet (40 mg total) by mouth daily. 10/02/14  Yes Josue Hector, MD  hydrochlorothiazide (HYDRODIURIL) 25 MG tablet Take 1 tablet (25 mg total) by mouth daily. 10/02/14  Yes Josue Hector, MD  losartan (COZAAR) 50 MG tablet Take 1 tablet (50 mg total) by mouth daily. 04/02/15  Yes Francesca Oman, DO  metFORMIN (GLUCOPHAGE) 1000 MG tablet Take 1 tablet (1,000 mg total) by mouth 2 (two) times daily. 11/16/14  Yes Alex Ronnie Derby, DO   BP 125/90 mmHg  Pulse 87  Temp(Src) 98.2 F (36.8 C) (Oral)  Resp 18  SpO2 98% Physical Exam  Constitutional: She is oriented to person, place, and time. She appears well-developed and well-nourished. No distress.  HENT:  Head: Normocephalic and atraumatic.  Eyes: Pupils are equal, round, and reactive to light.  Neck: Normal range of motion.  Cardiovascular: Normal rate and intact distal pulses.  Pulmonary/Chest: No respiratory distress.  Abdominal: Normal appearance. She exhibits no distension and no ascites. There is tenderness. There is no rigidity and no rebound. Hernia confirmed negative in the right inguinal area and confirmed negative in the left inguinal area.    Patient tender to palpation to the right of the peri-umbilical area.  Active bowel sounds.  Musculoskeletal: Normal range of motion.  Neurological: She is alert and oriented to person, place, and time. No cranial nerve deficit.  Skin: Skin is warm and dry. No rash noted.  Psychiatric: She has a normal mood and affect. Her behavior is normal.  Nursing note and vitals reviewed.   ED Course  Procedures (including critical care time) Medications  ketorolac (TORADOL) 30 MG/ML injection 30 mg (30 mg Intramuscular Given 04/30/15 1826)    Labs Review Labs Reviewed  COMPREHENSIVE METABOLIC PANEL - Abnormal; Notable for the following:      Potassium 3.4 (*)    Chloride 99 (*)    Glucose, Bld 147 (*)    All other components within normal limits  URINALYSIS, ROUTINE W REFLEX MICROSCOPIC (NOT AT [facility]) - Abnormal; Notable for the following:    Leukocytes, UA SMALL (*)    All other components within normal limits  URINE MICROSCOPIC-ADD ON - Abnormal; Notable for the following:    Squamous Epithelial / LPF 0-5 (*)    Bacteria, UA RARE (*)    All other components within normal limits  URINE CULTURE  LIPASE, BLOOD  CBC  URINE RAPID DRUG SCREEN, HOSP PERFORMED  I-STAT BETA HCG BLOOD, ED ([facility], [facility], [facility] ONLY)    Imaging Review Dg Abd 1 View  04/30/2015  CLINICAL DATA:  Abdominal pain in the right lower abdomen. EXAM: ABDOMEN - 1 VIEW COMPARISON:  08/02/2013 FINDINGS: Nonobstructive bowel gas pattern. Multiple phleboliths throughout the pelvis. Surgical clips in the right hemi pelvic region. Limited evaluation for free air on these images. IMPRESSION: No acute abnormality. Electronically Signed   By: Markus Daft M.D.   On: 04/30/2015 18:10   I have personally reviewed and evaluated these images and lab results as part of my medical decision-making.  I discussed case with the internal medicine resident.  Patient can be seen tomorrow for follow-up.  I cannot feel a definite hernia but there might be a small fat hernia that's not palpable.  Labs and x-ray are all basically unremarkable.  Patient will be given IM Toradol for pain.  MDM   Final diagnoses:  Right lower quadrant abdominal pain        Leonard Schwartz, MD 04/30/15 [phone]

## 2015-04-30 NOTE — ED Notes (Addendum)
Janice Massey, NS asked to call for a Sign Language Interpreter

## 2015-04-30 NOTE — ED Notes (Signed)
Paged Internal Medicine Teaching service to Dr. Leonard Schwartz, MD @ [phone].

## 2015-04-30 NOTE — Discharge Instructions (Signed)

## 2015-04-30 NOTE — ED Notes (Signed)
Pt is hearing impaired but can read lips.  Pt is complaining of severe RLQ abdominal pain for 2 weeks.  Reports some vomiting

## 2015-05-01 ENCOUNTER — Other Ambulatory Visit: Payer: Self-pay | Admitting: *Deleted

## 2015-05-01 ENCOUNTER — Encounter: Payer: Self-pay | Admitting: Internal Medicine

## 2015-05-01 ENCOUNTER — Ambulatory Visit (INDEPENDENT_AMBULATORY_CARE_PROVIDER_SITE_OTHER): Payer: Medicaid Other | Admitting: Internal Medicine

## 2015-05-01 VITALS — BP 138/73 | HR 81 | Temp 98.1°F | Ht 68.0 in | Wt 193.3 lb

## 2015-05-01 DIAGNOSIS — R1031 Right lower quadrant pain: Secondary | ICD-10-CM | POA: Diagnosis not present

## 2015-05-01 DIAGNOSIS — R1013 Epigastric pain: Secondary | ICD-10-CM

## 2015-05-01 DIAGNOSIS — I10 Essential (primary) hypertension: Secondary | ICD-10-CM

## 2015-05-01 DIAGNOSIS — E119 Type 2 diabetes mellitus without complications: Secondary | ICD-10-CM

## 2015-05-01 MED ORDER — PANTOPRAZOLE SODIUM 40 MG PO TBEC: 40.0000 mg | DELAYED_RELEASE_TABLET | Freq: Every day | ORAL | Status: DC

## 2015-05-01 MED ORDER — POLYETHYLENE GLYCOL 3350 17 G PO PACK: 17.0000 g | PACK | Freq: Every day | ORAL | Status: DC

## 2015-05-01 NOTE — Progress Notes (Signed)
Patient ID: Janice Massey, female   DOB: 1955-04-09, 60 y.o.   MRN: [id number]     Subjective:   Patient ID: Janice Massey female    DOB: April 26, 1955 60 y.o.    MRN: [id number] Health Maintenance Due: Health Maintenance Due  Topic Date Due  . PNEUMOCOCCAL POLYSACCHARIDE VACCINE (2) 10/08/2014  . LIPID PANEL  10/11/2014  . OPHTHALMOLOGY EXAM  01/08/2015  . FOOT EXAM  05/01/2015    _________________________________________________  HPI: Janice Massey is a 60 y.o. female here for ED f/u for abdominal pain.  Pt has a PMH outlined below.  Please see problem-based charting assessment and plan for further status of patient's chronic medical problems addressed at today's visit.  PMH: Past Medical History  Diagnosis Date  . Diabetes mellitus   . Hypertension   . Back pain   . Chronic PID   . Ventricular septal defect 2004 per echo    small membranous VSD  . Mute   . Deaf     Since age 19  . Atypical chest pain     myoview 10/29/10 - Post-stress EF 56%. Low risk and negative for ischemia   . Obese   . Dyslipidemia   . Cardiac murmur     small membranous VSD with fairly loud cardiac murmur (asymptomatic)   . Lightheadedness   . Dizziness   . Dyspnea   . History of Doppler ultrasound 06/20/09    Normal, no prior studies for comparison.   . Chronic PID (chronic pelvic inflammatory disease)   . Back pain   . URI (upper respiratory infection)     Medications: Current Outpatient Prescriptions on File Prior to Visit  Medication Sig Dispense Refill  . aspirin 81 MG tablet Take 81 mg by mouth every morning.    Marland Kitchen atorvastatin (LIPITOR) 40 MG tablet Take 1 tablet (40 mg total) by mouth daily. 90 tablet 3  . hydrochlorothiazide (HYDRODIURIL) 25 MG tablet Take 1 tablet (25 mg total) by mouth daily. 90 tablet 3  . losartan (COZAAR) 50 MG tablet Take 1 tablet (50 mg total) by mouth daily. 90 tablet 0  . metFORMIN (GLUCOPHAGE) 1000 MG tablet Take 1 tablet (1,000 mg total) by mouth 2 (two)  times daily. 180 tablet 1   No current facility-administered medications on file prior to visit.    Allergies: Allergies  Allergen Reactions  . Codeine Itching  . Lisinopril Cough  . Penicillins Itching    Has patient had a PCN reaction causing immediate rash, facial/tongue/throat swelling, SOB or lightheadedness with hypotension: YES Has patient had a PCN reaction causing severe rash involving mucus membranes or skin necrosis: NO Has patient had a PCN reaction that required hospitalization NO Has patient had a PCN reaction occurring within the last 10 years: NO If all of the above answers are "NO", then may proceed with Cephalosporin use.    FH: Family History  Problem Relation Age of Onset  . Cancer Mother     liver  . Heart attack Mother 34  . Hypertension Father   . Diabetes Father   . Heart attack Father   . CAD Father 44  . Diabetes Paternal Aunt   . Diabetes Maternal Grandfather     SH: Social History   Social History  . Marital Status: Single    Spouse Name: N/A  . Number of Children: 1  . Years of Education: N/A   Occupational History  .     Social History Main Topics  .  Smoking status: Never Smoker   . Smokeless tobacco: Never Used  . Alcohol Use: No  . Drug Use: No  . Sexual Activity: No   Other Topics Concern  . None   Social History Narrative    Review of Systems: Constitutional: Negative for fever, chills, weight loss.  Respiratory: Negative for shortness of breath.  Cardiovascular: Negative for chest pain.  Gastrointestinal: +nausea, vomiting, constipation, abdominal pain.   Objective:   Vital Signs: Filed Vitals:   05/01/15 1530 05/01/15 1531  BP:  138/73  Pulse:  81  Temp: 98.1 F (36.7 C)   TempSrc: Oral   Height: 5\' 8"  (1.727 m)   Weight: 193 lb 4.8 oz (87.68 kg)   SpO2:  97%      BP Readings from Last 3 Encounters:  05/01/15 138/73  04/30/15 130/72  04/02/15 137/80    Physical Exam: Constitutional: Vital signs  reviewed.  Patient is in NAD and cooperative with exam.  Head: Normocephalic and atraumatic. Eyes: EOMI, conjunctivae nl, no scleral icterus.  Neck: Supple. Cardiovascular: RRR, no MRG. Pulmonary/Chest: Normal effort, CTAB, no wheezes, rales, or rhonchi. Abdominal: Soft, decreased BS, diffuse tenderness>epigastric and right periumbilical/RLQ.  No rebound or guarding.  Bulge in the RLQ.   Neurological: A&O x3, cranial nerves II-XII are grossly intact, moving all extremities. Skin: Warm, dry and intact.    Assessment & Plan:   Assessment and plan was discussed and formulated with my attending.

## 2015-05-01 NOTE — Assessment & Plan Note (Addendum)
Pt presents for ED f/u of abdominal pain yesterday.  States the pain began around [place] and has gradually gotten worse.  Describes the pain as sharp and now constant.  No alleviating or aggravating factors.  Sometimes food makes it worse.  She had cholecystectomy previously but no other abdominal surgeries.  EGD in 2009 showed esophageal stricture which was dilated and gastritis.  She is somewhat tender in the epigastric region.  Also endorses using ibuprofen occasionally.  She is not on a PPI.  Labs unremarkable.  Abd XR unremarkable.  VSS.  She describes the pain centered around the umbilicus on the right side into the RLQ.  No fever/chills.  She has had some nausea but no vomiting.  She has also not had a normal BM in about 3 weeks.  There is a small bulge in her RLQ that may be a hernia.  She is nontoxic appearing.  She is eating OK and has no weight loss.  CT Abd/pelvis in 2015 showed no acute abnormality.  She is also taking metformin which may be a possible etiology to her abdominal pain.   -will put on PPI -no NSAIDS -will obtain CT Abd if pain persists  -miralax daily  -consider metformin as a possible cause of abdominal pain

## 2015-05-01 NOTE — Telephone Encounter (Signed)
Has refills at pharm 

## 2015-05-01 NOTE — Patient Instructions (Signed)
Thank you for your visit today.   Please return to the internal medicine clinic in about 2 weeks or sooner if needed.     I have made the following additions/changes to your medications:  I have sent a prescription for an acid reflux medication to your pharmacy, please take this daily prior to breakfast.   I have also sent a medication for miralax for constipation.  This should also be taken daily. If you pain continues, we will need to obtain further imaging.    Please be sure to bring all of your medications with you to every visit; this includes herbal supplements, vitamins, eye drops, and any over-the-counter medications.   Should you have any questions regarding your medications and/or any new or worsening symptoms, please be sure to call the clinic at [phone].   If you believe that you are suffering from a life threatening condition or one that may result in the loss of limb or function, then you should call 911 and proceed to the nearest Emergency Department.   Constipation, Adult Constipation is when a person has fewer than three bowel movements a week, has difficulty having a bowel movement, or has stools that are dry, hard, or larger than normal. As people grow older, constipation is more common. A low-fiber diet, not taking in enough fluids, and taking certain medicines may make constipation worse.  CAUSES   Certain medicines, such as antidepressants, pain medicine, iron supplements, antacids, and water pills.   Certain diseases, such as diabetes, irritable bowel syndrome (IBS), thyroid disease, or depression.   Not drinking enough water.   Not eating enough fiber-rich foods.   Stress or travel.   Lack of physical activity or exercise.   Ignoring the urge to have a bowel movement.   Using laxatives too much.  SIGNS AND SYMPTOMS   Having fewer than three bowel movements a week.   Straining to have a bowel movement.   Having stools that are hard, dry,  or larger than normal.   Feeling full or bloated.   Pain in the lower abdomen.   Not feeling relief after having a bowel movement.  DIAGNOSIS  Your health care provider will take a medical history and perform a physical exam. Further testing may be done for severe constipation. Some tests may include:  A barium enema X-ray to examine your rectum, colon, and, sometimes, your small intestine.   A sigmoidoscopy to examine your lower colon.   A colonoscopy to examine your entire colon. TREATMENT  Treatment will depend on the severity of your constipation and what is causing it. Some dietary treatments include drinking more fluids and eating more fiber-rich foods. Lifestyle treatments may include regular exercise. If these diet and lifestyle recommendations do not help, your health care provider may recommend taking over-the-counter laxative medicines to help you have bowel movements. Prescription medicines may be prescribed if over-the-counter medicines do not work.  HOME CARE INSTRUCTIONS   Eat foods that have a lot of fiber, such as fruits, vegetables, whole grains, and beans.  Limit foods high in fat and processed sugars, such as french fries, hamburgers, cookies, candies, and soda.   A fiber supplement may be added to your diet if you cannot get enough fiber from foods.   Drink enough fluids to keep your urine clear or pale yellow.   Exercise regularly or as directed by your health care provider.   Go to the restroom when you have the urge to go. Do not hold  it.   Only take over-the-counter or prescription medicines as directed by your health care provider. Do not take other medicines for constipation without talking to your health care provider first.  [place] IF:   You have bright red blood in your stool.   Your constipation lasts for more than 4 days or gets worse.   You have abdominal or rectal pain.   You have thin, pencil-like stools.    You have unexplained weight loss. MAKE SURE YOU:   Understand these instructions.  Will watch your condition.  Will get help right away if you are not doing well or get worse.   This information is not intended to replace advice given to you by your health care provider. Make sure you discuss any questions you have with your health care provider.   Document Released: 09/19/2003 Document Revised: 01/11/2014 Document Reviewed: 10/02/2012 Elsevier Interactive Patient Education [employer].

## 2015-05-02 LAB — URINE CULTURE: SPECIAL REQUESTS: NORMAL

## 2015-05-02 NOTE — Assessment & Plan Note (Signed)
BP 138/73.   -cont current meds

## 2015-05-04 NOTE — Progress Notes (Signed)
Case discussed with Dr. Gill soon after the resident saw the patient.  We reviewed the resident's history and exam and pertinent patient test results.  I agree with the assessment, diagnosis, and plan of care documented in the resident's note. 

## 2015-05-15 ENCOUNTER — Ambulatory Visit: Payer: Medicaid Other | Admitting: Internal Medicine

## 2015-05-15 ENCOUNTER — Encounter: Payer: Self-pay | Admitting: Internal Medicine

## 2015-05-27 ENCOUNTER — Other Ambulatory Visit: Payer: Self-pay | Admitting: Podiatry

## 2015-05-28 ENCOUNTER — Other Ambulatory Visit: Payer: Self-pay | Admitting: Internal Medicine

## 2015-06-18 ENCOUNTER — Ambulatory Visit (INDEPENDENT_AMBULATORY_CARE_PROVIDER_SITE_OTHER): Payer: Medicaid Other | Admitting: Internal Medicine

## 2015-06-18 ENCOUNTER — Encounter: Payer: Self-pay | Admitting: Internal Medicine

## 2015-06-18 ENCOUNTER — Encounter: Payer: Self-pay | Admitting: *Deleted

## 2015-06-18 ENCOUNTER — Ambulatory Visit (HOSPITAL_COMMUNITY)
Admission: RE | Admit: 2015-06-18 | Discharge: 2015-06-18 | Disposition: A | Discharge status: Home / Self Care | Payer: Medicaid Other | Source: Ambulatory Visit | Attending: Internal Medicine | Admitting: Internal Medicine

## 2015-06-18 ENCOUNTER — Other Ambulatory Visit: Payer: Self-pay | Admitting: Internal Medicine

## 2015-06-18 VITALS — BP 139/78 | HR 85 | Temp 98.4°F | Resp 18 | Ht 65.0 in | Wt 190.8 lb

## 2015-06-18 DIAGNOSIS — M25551 Pain in right hip: Secondary | ICD-10-CM

## 2015-06-18 DIAGNOSIS — K219 Gastro-esophageal reflux disease without esophagitis: Secondary | ICD-10-CM

## 2015-06-18 DIAGNOSIS — Z8719 Personal history of other diseases of the digestive system: Secondary | ICD-10-CM | POA: Diagnosis not present

## 2015-06-18 DIAGNOSIS — E119 Type 2 diabetes mellitus without complications: Secondary | ICD-10-CM

## 2015-06-18 DIAGNOSIS — I10 Essential (primary) hypertension: Secondary | ICD-10-CM | POA: Diagnosis not present

## 2015-06-18 DIAGNOSIS — M7060 Trochanteric bursitis, unspecified hip: Secondary | ICD-10-CM

## 2015-06-18 DIAGNOSIS — M79651 Pain in right thigh: Secondary | ICD-10-CM | POA: Diagnosis not present

## 2015-06-18 DIAGNOSIS — Q210 Ventricular septal defect: Secondary | ICD-10-CM

## 2015-06-18 DIAGNOSIS — Z7984 Long term (current) use of oral hypoglycemic drugs: Secondary | ICD-10-CM

## 2015-06-18 LAB — POCT GLYCOSYLATED HEMOGLOBIN (HGB A1C): HEMOGLOBIN A1C: 7.3

## 2015-06-18 LAB — GLUCOSE, CAPILLARY: GLUCOSE-CAPILLARY: 132 mg/dL — AB (ref 65–99)

## 2015-06-18 MED ORDER — DICLOFENAC SODIUM 1 % TD GEL: 2.0000 g | Freq: Four times a day (QID) | TRANSDERMAL | Status: DC | PRN

## 2015-06-18 NOTE — Progress Notes (Signed)
Subjective:    Patient ID: Janice Massey, female    DOB: 12-Mar-1955, 60 y.o.   MRN: [id number]  HPI Comments: Janice Massey is a 60 year old woman with PMH as below here for follow-up of DM.  Also with c/o right lateral hip pain x 2 months. Please see problem based charting for status of this and other conditions.   Hip Pain  The incident occurred more than 1 week ago (2 months ago). The pain is present in the right thigh. Quality: tingling, sharp. The pain is at a severity of 8/10. Associated symptoms include tingling. Pertinent negatives include no inability to bear weight or numbness. She reports no foreign bodies present. Exacerbated by: rolling onto side at night. She has tried NSAIDs for the symptoms. The treatment provided no relief.     Past Medical History  Diagnosis Date  . Diabetes mellitus   . Hypertension   . Back pain   . Chronic PID   . Ventricular septal defect 2004 per echo    small membranous VSD  . M[place]   . Deaf     Since age 73  . Atypical chest pain     myoview 10/29/10 - Post-stress EF 56%. Low risk and negative for ischemia   . Obese   . Dyslipidemia   . Cardiac murmur     small membranous VSD with fairly loud cardiac murmur (asymptomatic)   . Lightheadedness   . Dizziness   . Dyspnea   . History of Doppler ultrasound 06/20/09    Normal, no prior studies for comparison.   . Chronic PID (chronic pelvic inflammatory disease)   . Back pain   . URI (upper respiratory infection)    Current Outpatient Prescriptions on File Prior to Visit  Medication Sig Dispense Refill  . aspirin 81 MG tablet Take 81 mg by mouth every morning.    Marland Kitchen atorvastatin (LIPITOR) 40 MG tablet Take 1 tablet (40 mg total) by mouth daily. 90 tablet 3  . hydrochlorothiazide (HYDRODIURIL) 25 MG tablet Take 1 tablet (25 mg total) by mouth daily. 90 tablet 3  . losartan (COZAAR) 50 MG tablet Take 1 tablet (50 mg total) by mouth daily. 90 tablet 0  . metFORMIN (GLUCOPHAGE) 1000 MG tablet Take 1  tablet (1,000 mg total) by mouth 2 (two) times daily. 180 tablet 1  . pantoprazole (PROTONIX) 40 MG tablet TAKE 1 TABLET (40 MG TOTAL) BY MOUTH DAILY. 30 tablet 0  . polyethylene glycol (MIRALAX / GLYCOLAX) packet Take 17 g by mouth daily. 14 each 1   No current facility-administered medications on file prior to visit.    Review of Systems  Constitutional: Positive for appetite change. Negative for fever, chills and unexpected weight change.       Poor appetite since hip pain started  Respiratory: Negative for shortness of breath.   Cardiovascular: Negative for chest pain.  Gastrointestinal: Positive for constipation. Negative for nausea, vomiting, abdominal pain, diarrhea and blood in stool.       Occasional constipation  Genitourinary: Negative for dysuria, hematuria, vaginal bleeding, vaginal discharge and difficulty urinating.  Musculoskeletal: Negative for back pain.  Neurological: Positive for tingling. Negative for numbness.       Filed Vitals:   06/18/15 1357 06/18/15 1505  BP: 151/89 139/78  Pulse: 85   Temp: 98.4 F (36.9 C)   TempSrc: Oral   Resp: 18   Height: 5\' 5"  (1.651 m)   Weight: 190 lb 12.8 oz (86.546 kg)  SpO2: 100%     Objective:   Physical Exam  Constitutional: She is oriented to person, place, and time. She appears well-developed. No distress.  HENT:  Head: Normocephalic and atraumatic.  Mouth/Throat: Oropharynx is clear and moist. No oropharyngeal exudate.  Eyes: Conjunctivae and EOM are normal. Pupils are equal, round, and reactive to light. Right eye exhibits no discharge. Left eye exhibits no discharge. No scleral icterus.  Neck: Neck supple.  Cardiovascular: Normal rate, regular rhythm and intact distal pulses.  Exam reveals no gallop and no friction rub.   Murmur heard. Pulmonary/Chest: Effort normal and breath sounds normal. No respiratory distress. She has no wheezes. She has no rales.  Abdominal: Soft. Bowel sounds are normal. She exhibits  no distension and no mass. There is no tenderness. There is no rebound and no guarding.  Musculoskeletal: Normal range of motion. She exhibits tenderness. She exhibits no edema.  Right greater troch is TTP.  Hip ROM is normal.  There is lateral thigh pain with ROM but no groin pain.   Neurological: She is alert and oriented to person, place, and time. No cranial nerve deficit. She exhibits normal muscle tone.  5/5 MMS upper/lower including quad/ham  Skin: Skin is warm and dry. No rash noted. She is not diaphoretic. No erythema.  The skin overlying the thigh is normal color and temp.  There are no rashes.  Psychiatric: She has a normal mood and affect. Her behavior is normal. Judgment and thought content normal.  Vitals reviewed.         Assessment & Plan:  Please see problem based charting for A&P.

## 2015-06-18 NOTE — Patient Instructions (Signed)
1. I will send you for imaging for your hip.  You can use the Voltaren gel up to four times per day to help with the pain.  Please avoid sleeping on the side that aches.  Depending upon what images show, I will send you to Sports Med for possible steroid injection.   2. Please take all medications as prescribed.    3. If you have worsening of your symptoms or new symptoms arise, please call the clinic [phone]), or go to the ER immediately if symptoms are severe.

## 2015-06-19 NOTE — Assessment & Plan Note (Addendum)
Lab Results  Component Value Date   HGBA1C 7.3 06/18/2015   HGBA1C 7.3 04/02/2015   HGBA1C 6.2 11/13/2014     Assessment: Diabetes control:  fair control Progress toward A1C goal:   stable Comments: Compliant with Metformin 1g BID.  No ADRs.    Plan: Medications:  continue current medications Home glucose monitoring:  No Instruction/counseling given: reminded to get eye exam Other plans: Working on treating her trochanteric pain so she can get back to walking more.  Referral placed to ophthalmology (she prefers to return to [facility]).  RTC in 3 months for follow-up.

## 2015-06-19 NOTE — Assessment & Plan Note (Signed)
Assessment:  Asymptomatic.  No longer using PPI. Plan:  Continue to monitor for recurrence of symptoms.

## 2015-06-19 NOTE — Assessment & Plan Note (Signed)
Assessment:  She continues to have right lateral thigh pain and is very TTP of greater troch.  Pain goes down to knee but not below.  She has no numbness/tingling of anterior thigh to suggest meralgia paresthetic.  There is no groin pain or limited ROM to suggest hip OA.  There is no back pain to suggest referred pain.  She tells me this has been going on for about two months but chart review reveals she had this same complaint when I saw her three months ago.  She says this is the same pain and that it never went away.  She tried NSAIDs regularly for a few days but didn't feel it helped so she stopped.  No weight loss, fever or systemic symptoms, cancer screening up to date.  Plan:  Check right hip xray today.  Rx sent for Voltaren gel, try ice or heat prn, avoid lying on affected side and activities that aggravate the pain.  Referral placed to Sports Medicine for [place] and steroid injection.

## 2015-06-19 NOTE — Assessment & Plan Note (Signed)
Assessment:  Murmur unchanged.  Asymptomatic.  Last evaluated by Dr. Johnsie Cancel 9 months ago and is due for follow-up ECHO and office visit in 3 months. Plan:  Follow-up as previously scheduled for repeat ECHO and office visit with Dr. Johnsie Cancel in 3 months.

## 2015-06-19 NOTE — Assessment & Plan Note (Signed)
BP Readings from Last 3 Encounters:  06/18/15 139/78  05/01/15 138/73  04/30/15 130/72    Lab Results  Component Value Date   NA 140 04/30/2015   K 3.4* 04/30/2015   CREATININE 0.68 04/30/2015    Assessment: Blood pressure control:  well controlled Progress toward BP goal:   at goal Comments: Compliant with HCTZ 25mg  daily and losartan 14m daily. She has brought med bottles with her.  Plan: Medications:  continue current medications:  HCTZ 25mg  daily and losartan 65m daily Other plans: RTC in 3 months for follow-up.

## 2015-06-21 NOTE — Progress Notes (Signed)
Internal Medicine Clinic Attending  Case discussed with Dr. Wilson soon after the resident saw the patient.  We reviewed the resident's history and exam and pertinent patient test results.  I agree with the assessment, diagnosis, and plan of care documented in the resident's note.  

## 2015-06-24 ENCOUNTER — Other Ambulatory Visit: Payer: Self-pay | Admitting: Internal Medicine

## 2015-06-25 NOTE — Telephone Encounter (Signed)
LVM to return call.

## 2015-06-27 ENCOUNTER — Ambulatory Visit (INDEPENDENT_AMBULATORY_CARE_PROVIDER_SITE_OTHER): Payer: Medicaid Other | Admitting: Family Medicine

## 2015-06-27 ENCOUNTER — Encounter: Payer: Self-pay | Admitting: Family Medicine

## 2015-06-27 VITALS — BP 135/77 | HR 77 | Ht 65.0 in | Wt 190.0 lb

## 2015-06-27 DIAGNOSIS — M7060 Trochanteric bursitis, unspecified hip: Secondary | ICD-10-CM | POA: Diagnosis present

## 2015-06-27 MED ORDER — METHYLPREDNISOLONE ACETATE 40 MG/ML IJ SUSP
40.0000 mg | Freq: Once | INTRAMUSCULAR | Status: AC
  Administered 2015-06-27: 40 mg via INTRA_ARTICULAR

## 2015-06-27 NOTE — Addendum Note (Signed)
Addended bySela Hua on: 06/27/2015 12:33 PM   Modules accepted: Orders

## 2015-06-27 NOTE — Progress Notes (Signed)
Patient ID: Janice Massey, female   DOB: September 01, 1955, 60 y.o.   MRN: [id number] [facility]: Attending Note: I have reviewed the chart, discussed wit the Sports Medicine Fellow. I agree with assessment and treatment plan as detailed in the Lane note. Present for injection. Would be willing to  Do second injection if she has only partial relief.

## 2015-06-27 NOTE — Progress Notes (Signed)
  Janice Massey - 60 y.o. female MRN [id number]  Date of birth: 08-29-1955 Janice Massey is a 60 y.o. female who presents today for right hip pain.  Right hip pain, lateral, initial visit 06/27/15-patient presents today with right lateral hip pain times several months. She denies any specific injury or swelling in the region. Previously has seen her PCP who thought this was trochanteric bursitis and recommended topical medication as well as referral for possible injection. Pain is worse with laying on the area or touching the area. She has not tried any type of physical therapy and has not started the topical medication to date. No paresthesias numbness or tingling going down her leg.Pt denies any current bowel/bladder problems, fever, chills, unintentional weight loss, night time awakenings secondary to pain, weakness in one or both legs  PMHx - Updated and reviewed.  Contributory factors include: Hypertension and deafness PSHx - Updated and reviewed.  Contributory factors include:  Cholecystectomy FHx - Updated and reviewed.  Contributory factors include:  CAD father Social Hx - Updated and reviewed. Contributory factors include: Nonsmoker  Medications - reviewed   ROS Per HPI.  12 point negative other than per HPI.   Exam:  Filed Vitals:   06/27/15 1114  BP: 135/77  Pulse: 77   Gen: NAD, AAO 3 Cardio- RRR Pulm - Normal respiratory effort/rate Skin: No rashes or erythema Extremities: No edema  Vascular: pulses +2 bilateral upper and lower extremity Psych: Normal affect  msk hip: Hip: ROM IR: 80 Deg, ER: 80 Deg, Flexion: 120 Deg, Extension: 100 Deg, Abduction: 45 Deg, Adduction: 45 Deg Strength IR: 5/5, ER: 5/5, Flexion: 5/5, Extension: 5/5, Abduction: 5/5, Adduction: 5/5 Pelvic alignment unremarkable to inspection and palpation. Standing hip rotation and gait without trendelenburg / unsteadiness. Greater trochanter with tenderness to palpation.  Imaging:  Right hip x-ray 2017 with  no joint space narrowing or subchondral sclerosis

## 2015-06-27 NOTE — Progress Notes (Signed)
Interpreter is Carlisle Cater,  Patient is deaf

## 2015-06-27 NOTE — Assessment & Plan Note (Signed)
Patient with lateral hip pain worse with laying on that side and tender to palpation the superior posterior facet consistent with greater trochanteric pain syndrome. -Start the Voltaren which was called into her pharmacy. I also offered her Mobic today by mouth but she would like to hold on this for now. -Injection today into the trochanteric bursa. Also consider formal physical therapy versus hip exercises for gluteus medius in the future if this does not work.   Aspiration/Injection Procedure Note Janice Massey September 18, 1955  Procedure: Injection Indications: Greater trochanteric pain syndrome right  Procedure Details Consent: Risks of procedure as well as the alternatives and risks of each were explained to the (patient/caregiver).  Consent for procedure obtained. Time Out: Verified patient identification, verified procedure, site/side was marked, verified correct patient position, special equipment/implants available, medications/allergies/relevent history reviewed, required imaging and test results available.  Performed.  The area was cleaned with iodine and alcohol swabs.    The R greater trochanteric bursal region was injected using 1 cc's of 40 Depomedrol and 3 cc's of 1% lidocaine with a 21 1 1/2" needle. .    A sterile dressing was applied.  Patient did tolerate procedure well.

## 2015-07-07 ENCOUNTER — Other Ambulatory Visit: Payer: Self-pay | Admitting: Internal Medicine

## 2015-07-14 LAB — HM DIABETES EYE EXAM

## 2015-07-21 ENCOUNTER — Encounter: Payer: Self-pay | Admitting: *Deleted

## 2015-08-20 ENCOUNTER — Emergency Department (HOSPITAL_COMMUNITY)
Admission: EM | Admit: 2015-08-20 | Discharge: 2015-08-20 | Disposition: A | Discharge status: Home / Self Care | Payer: Self-pay | Attending: Emergency Medicine | Admitting: Emergency Medicine

## 2015-08-20 ENCOUNTER — Emergency Department (HOSPITAL_COMMUNITY): Payer: Self-pay

## 2015-08-20 ENCOUNTER — Encounter (HOSPITAL_COMMUNITY): Payer: Self-pay | Admitting: Emergency Medicine

## 2015-08-20 DIAGNOSIS — K5900 Constipation, unspecified: Secondary | ICD-10-CM | POA: Insufficient documentation

## 2015-08-20 DIAGNOSIS — R1084 Generalized abdominal pain: Secondary | ICD-10-CM

## 2015-08-20 DIAGNOSIS — N390 Urinary tract infection, site not specified: Secondary | ICD-10-CM | POA: Insufficient documentation

## 2015-08-20 LAB — URINALYSIS, ROUTINE W REFLEX MICROSCOPIC
BILIRUBIN URINE: NEGATIVE
Glucose, UA: NEGATIVE mg/dL
Ketones, ur: 15 mg/dL — AB
Nitrite: NEGATIVE
Protein, ur: NEGATIVE mg/dL
SPECIFIC GRAVITY, URINE: 1.029 (ref 1.005–1.030)
pH: 6 (ref 5.0–8.0)

## 2015-08-20 LAB — URINE MICROSCOPIC-ADD ON

## 2015-08-20 LAB — COMPREHENSIVE METABOLIC PANEL
ALK PHOS: 61 U/L (ref 38–126)
ALT: 14 U/L (ref 14–54)
ANION GAP: 4 — AB (ref 5–15)
AST: 16 U/L (ref 15–41)
Albumin: 4.1 g/dL (ref 3.5–5.0)
BILIRUBIN TOTAL: 1 mg/dL (ref 0.3–1.2)
BUN: 14 mg/dL (ref 6–20)
CALCIUM: 9.1 mg/dL (ref 8.9–10.3)
CO2: 26 mmol/L (ref 22–32)
Chloride: 111 mmol/L (ref 101–111)
Creatinine, Ser: 0.8 mg/dL (ref 0.44–1.00)
GFR calc non Af Amer: >60 mL/min (ref >60)
Glucose, Bld: 104 mg/dL — ABNORMAL HIGH (ref 65–99)
Potassium: 3.3 mmol/L — ABNORMAL LOW (ref 3.5–5.1)
Sodium: 141 mmol/L (ref 135–145)
TOTAL PROTEIN: 6.5 g/dL (ref 6.5–8.1)

## 2015-08-20 LAB — CBC
HCT: 35.7 % — ABNORMAL LOW (ref 36.0–46.0)
HEMOGLOBIN: 11.7 g/dL — AB (ref 12.0–15.0)
MCH: 30.9 pg (ref 26.0–34.0)
MCHC: 32.8 g/dL (ref 30.0–36.0)
MCV: 94.2 fL (ref 78.0–100.0)
Platelets: 257 10*3/uL (ref 150–400)
RBC: 3.79 MIL/uL — AB (ref 3.87–5.11)
RDW: 12.9 % (ref 11.5–15.5)
WBC: 6.1 10*3/uL (ref 4.0–10.5)

## 2015-08-20 LAB — LIPASE, BLOOD: Lipase: 17 U/L (ref 11–51)

## 2015-08-20 MED ORDER — DICYCLOMINE HCL 20 MG PO TABS: 20.0000 mg | ORAL_TABLET | Freq: Two times a day (BID) | ORAL | 0 refills | Status: DC

## 2015-08-20 MED ORDER — POLYETHYLENE GLYCOL 3350 17 G PO PACK: 17.0000 g | PACK | Freq: Every day | ORAL | 0 refills | Status: DC

## 2015-08-20 MED ORDER — DOCUSATE SODIUM 100 MG PO CAPS: 100.0000 mg | ORAL_CAPSULE | Freq: Two times a day (BID) | ORAL | 0 refills | Status: DC

## 2015-08-20 MED ORDER — DEXTROSE 5 % IV SOLN
1.0000 g | Freq: Once | INTRAVENOUS | Status: AC
  Administered 2015-08-20: 1 g via INTRAVENOUS
  Filled 2015-08-20: qty 10

## 2015-08-20 MED ORDER — CEPHALEXIN 250 MG PO CAPS: 250.0000 mg | ORAL_CAPSULE | Freq: Four times a day (QID) | ORAL | 0 refills | Status: DC

## 2015-08-20 MED ORDER — MORPHINE SULFATE (PF) 4 MG/ML IV SOLN
4.0000 mg | Freq: Once | INTRAVENOUS | Status: AC
  Administered 2015-08-20: 4 mg via INTRAVENOUS
  Filled 2015-08-20: qty 1

## 2015-08-20 MED ORDER — ONDANSETRON HCL 4 MG/2ML IJ SOLN
4.0000 mg | Freq: Once | INTRAMUSCULAR | Status: AC
  Administered 2015-08-20: 4 mg via INTRAVENOUS
  Filled 2015-08-20: qty 2

## 2015-08-20 MED ORDER — SODIUM CHLORIDE 0.9 % IV SOLN
INTRAVENOUS | Status: DC
  Administered 2015-08-20: 06:00:00 via INTRAVENOUS

## 2015-08-20 MED ORDER — IOPAMIDOL (ISOVUE-300) INJECTION 61%
100.0000 mL | Freq: Once | INTRAVENOUS | Status: AC | PRN
  Administered 2015-08-20: 100 mL via INTRAVENOUS

## 2015-08-20 NOTE — ED Notes (Signed)
Pt taken to CT.

## 2015-08-20 NOTE — ED Triage Notes (Signed)
Pt presents to Ed for assessment of abdominal pain that started approx 2 days ago.  Denies nausea and vomiting.  Sts pain is constant and crampy.  Pt sts she has a hx of a bowel blockage, and has not had a BM in 2 days.

## 2015-08-20 NOTE — ED Provider Notes (Addendum)
TIME SEEN: 5:35 AM  CHIEF COMPLAINT: Abdominal pain  HPI: Pt is a 60 y.o. female with history of hysterectomy, small bowel obstruction who presents to the emergency department with diffuse Donald pain that she describes as a crampy pain for the past 2 days. Describes as moderate in nature without aggravating or relieving factors and no radiation. States this feels somewhat similar to her prior bowel obstructions she has not had nausea or vomiting like she normally does with her bowel obstructions. She reports she is passing gas and hasn't had a bowel movement in 2 days. She has had some dysuria but no hematuria, urinary frequency or urgency. No fever. No vaginal bleeding or discharge.  ROS: See HPI Constitutional: no fever  Eyes: no drainage  ENT: no runny nose   Cardiovascular:  no chest pain  Resp: no SOB  GI: no vomiting GU: no dysuria Integumentary: no rash  Allergy: no hives  Musculoskeletal: no leg swelling  Neurological: no slurred speech ROS otherwise negative  PAST MEDICAL HISTORY/PAST SURGICAL HISTORY:  Past Medical History:  Diagnosis Date  . Small bowel obstruction (HCC) 2013, 2015    MEDICATIONS:  Prior to Admission medications   Medication Sig Start Date End Date Taking? Authorizing Provider  acetaminophen (TYLENOL) 500 MG tablet Take 1,000 mg by mouth every 6 (six) hours as needed for headache.    Historical Provider, MD  azithromycin (ZITHROMAX Z-PAK) 250 MG tablet Take as directed on pack 01/24/15   Linna HoffJames D Kindl, MD  dicyclomine (BENTYL) 20 MG tablet Take 1 tablet (20 mg total) by mouth 2 (two) times daily. 08/03/14   Kaitlyn Szekalski, PA-C  guaiFENesin-codeine (ROBITUSSIN AC) 100-10 MG/5ML syrup Take 10 mLs by mouth 4 (four) times daily as needed for cough. 01/24/15   Linna HoffJames D Kindl, MD  nitroGLYCERIN (NITROSTAT) 0.4 MG SL tablet Place 1 tablet (0.4 mg total) under the tongue every 5 (five) minutes as needed for chest pain. Patient not taking: Reported on 05/21/2014  10/05/12   Elenora GammaSamuel L Bradshaw, MD  omeprazole (PRILOSEC) 20 MG capsule Take 1 capsule (20 mg total) by mouth daily. 06/11/14   Stephanie Couphristopher M Street, MD  ondansetron (ZOFRAN ODT) 4 MG disintegrating tablet Take 1 tablet (4 mg total) by mouth every 8 (eight) hours as needed for nausea or vomiting. 08/03/14   Emilia BeckKaitlyn Szekalski, PA-C  permethrin (ELIMITE) 5 % cream Apply to entire body other than face - let sit for 14 hours then wash off, may repeat in 1 week if still having symptoms 01/01/15   Joni ReiningNicole Pisciotta, PA-C  polyethylene glycol powder (GLYCOLAX/MIRALAX) powder Take 17 g by mouth 2 (two) times daily as needed. 06/11/14   Stephanie Couphristopher M Street, MD  predniSONE (DELTASONE) 20 MG tablet Take 2 tablets (40 mg total) by mouth daily. 01/01/15   Nicole Pisciotta, PA-C  promethazine (PHENERGAN) 25 MG tablet Take 1 tablet (25 mg total) by mouth every 6 (six) hours as needed for nausea or vomiting. Patient not taking: Reported on 05/21/2014 05/19/14   Charm RingsErin J Honig, MD    ALLERGIES:  No Known Allergies  SOCIAL HISTORY:  Social History  Substance Use Topics  . Smoking status: Never Smoker  . Smokeless tobacco: Never Used  . Alcohol use Yes     Comment: sometimes    FAMILY HISTORY: History reviewed. No pertinent family history.  EXAM: BP 126/75 (BP Location: Right Arm)   Pulse (!) 49   Temp 97.7 F (36.5 C) (Oral)   Resp 22   Ht  5\' 2"  (1.575 m)   Wt 164 lb 6.4 oz (74.6 kg)   SpO2 99%   BMI 30.07 kg/m  CONSTITUTIONAL: Alert and oriented and responds appropriately to questions. Well-appearing; well-nourished HEAD: Normocephalic EYES: Conjunctivae clear, PERRL ENT: normal nose; no rhinorrhea; moist mucous membranes NECK: Supple, no meningismus, no LAD  CARD: RRR; S1 and S2 appreciated; no murmurs, no clicks, no rubs, no gallops RESP: Normal chest excursion without splinting or tachypnea; breath sounds clear and equal bilaterally; no wheezes, no rhonchi, no rales, no hypoxia or respiratory  distress, speaking full sentences ABD/GI: Normal bowel sounds; non-distended; soft,Diffusely tender throughout the abdomen with some voluntary guarding, no rebound, no peritoneal signs BACK:  The back appears normal and is non-tender to palpation, there is no CVA tenderness EXT: Normal ROM in all joints; non-tender to palpation; no edema; normal capillary refill; no cyanosis, no calf tenderness or swelling    SKIN: Normal color for age and race; warm; no rash NEURO: Moves all extremities equally, sensation to light touch intact diffusely, cranial nerves II through XII intact PSYCH: The patient's mood and manner are appropriate. Grooming and personal hygiene are appropriate.  MEDICAL DECISION MAKING: Patient here with abdominal pain. Has history of bowel obstruction. She is diffusely tender throughout the abdomen has normoactive bowel sounds without significant distention or tympany. She has no nausea or vomiting which she states is different for her from her prior bowel obstructions. Discussed with patient that partial bowel dissection is on the differential as is colitis, diverticulitis.  Doubt appendicitis given no focal right lower quadrant tenderness on exam, fevers or vomiting. She does complain of dysuria and appears to have a mild urinary tract infection. States her pain in her abdomen however seems like it was higher than the suprapubic region. Will obtain urine culture and give ceftriaxone. We'll give pain and nausea medicine. We'll obtain a CT of her abdomen and pelvis for further evaluation.  ED PROGRESS: 8:15 AM  Pt's CTAP unremarkable for acute abnormality.  Will dc home with Keflex for UTI and bowel regimen for constipation.     At this time, I do not feel there is any life-threatening condition present. I have reviewed and discussed all results (EKG, imaging, lab, urine as appropriate), exam findings with patient/family. I have reviewed nursing notes and appropriate previous records.  I  feel the patient is safe to be discharged home without further emergent workup and can continue workup as an outpatient. Discussed usual and customary return precautions. Patient/family verbalize understanding and are comfortable with this plan.  Outpatient follow-up has been provided. All questions have been answered.      Layla MawKristen N Avyukth Bontempo, DO 08/20/15 0815    Of note, pt has been intermittently bradycardiac in mid 40s but normotensive.  Not on beta blockers.  This appears chronic for her.  No CP, SOB, lightheadedness, syncope.  Sinus bradycardia on the monitor.      Layla MawKristen N Abdirahim Flavell, DO 08/20/15 [id number]

## 2015-08-21 LAB — URINE CULTURE: Culture: 60000 — AB

## 2015-08-22 ENCOUNTER — Telehealth (HOSPITAL_BASED_OUTPATIENT_CLINIC_OR_DEPARTMENT_OTHER): Payer: Self-pay

## 2015-08-22 NOTE — Telephone Encounter (Signed)
Post ED Visit - Positive Culture Follow-up  Culture report reviewed by antimicrobial stewardship pharmacist:  []  Brandi Mendez, Pharm.D. []  Brandi Mendez, Pharm.D., BCPS []  Brandi Mendez, Pharm.D. []  Brandi Mendez, Pharm.D., BCPS []  Brandi Mendez, [address]Ph[address]D., BCPS, AAHIVP []  Brandi Mendez, Pharm.D., BCPS, AAHIVP []  Brandi Mendez, Pharm.D. []  Brandi Mendez, [address]Ph[address] Brandi Mendez Pharm D Positive urine culture Treated with Cephalexin, organism sensitive to the same and no further patient follow-up is required at this time.  Brandi Mendez, Brandi Mendez 08/22/2015, 9:15 AM

## 2015-08-29 ENCOUNTER — Other Ambulatory Visit: Payer: Self-pay

## 2015-08-29 DIAGNOSIS — I10 Essential (primary) hypertension: Secondary | ICD-10-CM

## 2015-08-29 DIAGNOSIS — E785 Hyperlipidemia, unspecified: Secondary | ICD-10-CM

## 2015-08-29 MED ORDER — ATORVASTATIN CALCIUM 40 MG PO TABS: 40.0000 mg | ORAL_TABLET | Freq: Every day | ORAL | 3 refills | Status: DC

## 2015-08-29 MED ORDER — HYDROCHLOROTHIAZIDE 25 MG PO TABS: 25.0000 mg | ORAL_TABLET | Freq: Every day | ORAL | 3 refills | Status: DC

## 2015-08-29 NOTE — Telephone Encounter (Signed)
Requesting metformin, hydrochlorothiazide, Lipitor and losartan to be filled @ [facility] on [address].

## 2015-08-29 NOTE — Telephone Encounter (Signed)
Has metformin and losartan refills since filled in July for 6 months

## 2015-09-25 NOTE — Progress Notes (Signed)
10/02/2015 Janice Massey   12/20/1955  [id number]  Primary Physician Einar Gip, DO Primary Cardiologist: Dr. Johnsie Cancel   Reason for Visit/CC:  Chest Pain  HPI:  The patient is a 60 year old deaf female previously followed by Dr. Rollene Fare. She presents to clinic today with the assistance of a translator. Her past cardiac history is significant for VSD. Her most recent 2-D echocardiogram 11/11/14  revealed a small, restrictive perimembranous VSD. She was noted to have normal LV size and systolic function at that time with an estimated ejection fraction of 55-60%. Normal RV size and systolic function. She has no documented history of coronary disease. She underwent a Myoview stress test in December 2016  for chest pain which was a normal study without ischemia. EF was 62%. Additional past medical history is also significant for diabetes, hypertension and hyperlipidemia. She is on appropriate medical therapy and reports full daily compliance.   Reviewed echo today and no change in restrictive VSD .  She is a twin.  Granddaughter living with her Has other family in town Sign language interpreter with her today    Current Outpatient Prescriptions  Medication Sig Dispense Refill  . aspirin 81 MG tablet Take 81 mg by mouth every morning.    Marland Kitchen atorvastatin (LIPITOR) 40 MG tablet Take 1 tablet (40 mg total) by mouth daily. 90 tablet 3  . diclofenac sodium (VOLTAREN) 1 % GEL Apply 2 g topically 4 (four) times daily as needed. 100 g 0  . hydrochlorothiazide (HYDRODIURIL) 25 MG tablet Take 1 tablet (25 mg total) by mouth daily. 90 tablet 3  . losartan (COZAAR) 50 MG tablet TAKE 1 TABLET (50 MG TOTAL) BY MOUTH DAILY. 90 tablet 1  . metFORMIN (GLUCOPHAGE) 1000 MG tablet TAKE 1 TABLET (1,000 MG TOTAL) BY MOUTH 2 (TWO) TIMES DAILY. 180 tablet 1  . polyethylene glycol (MIRALAX / GLYCOLAX) packet Take 17 g by mouth daily. 14 each 1   No current facility-administered medications for this visit.      Allergies  Allergen Reactions  . Codeine Itching  . Lisinopril Cough  . Penicillins Itching    Has patient had a PCN reaction causing immediate rash, facial/tongue/throat swelling, SOB or lightheadedness with hypotension: YES Has patient had a PCN reaction causing severe rash involving mucus membranes or skin necrosis: NO Has patient had a PCN reaction that required hospitalization NO Has patient had a PCN reaction occurring within the last 10 years: NO If all of the above answers are "NO", then may proceed with Cephalosporin use.    Social History   Social History  . Marital status: Single    Spouse name: N/A  . Number of children: 1  . Years of education: N/A   Occupational History  .  Unemployed   Social History Main Topics  . Smoking status: Never Smoker  . Smokeless tobacco: Never Used  . Alcohol use No  . Drug use: No  . Sexual activity: No   Other Topics Concern  . Not on file   Social History Narrative  . No narrative on file     Review of Systems: General: negative for chills, fever, night sweats or weight changes.  Cardiovascular: negative for chest pain, dyspnea on exertion, edema, orthopnea, palpitations, paroxysmal nocturnal dyspnea or shortness of breath Dermatological: negative for rash Respiratory: negative for cough or wheezing Urologic: negative for hematuria Abdominal: negative for nausea, vomiting, diarrhea, bright red blood per rectum, melena, or hematemesis Neurologic: negative for visual changes, syncope, or  dizziness All other systems reviewed and are otherwise negative except as noted above.    Blood pressure (!) 148/80, pulse 73, resp. rate 18, height 5\' 7"  (1.702 m), weight 86.2 kg (190 lb), SpO2 98 %.   Affect appropriate Healthy:  appears stated age 42: Deaf Neck supple with no adenopathy JVP normal no bruits no thyromegaly Lungs clear with no wheezing and good diaphragmatic motion Heart:  S1/S2 VSD  murmur, no rub, gallop  or click PMI normal Abdomen: benighn, BS positve, no tenderness, no AAA no bruit.  No HSM or HJR Distal pulses intact with no bruits No edema Neuro non-focal Skin warm and dry No muscular weakness   EKG NSR. Non significant inferolateral Q waves   ASSESSMENT AND PLAN:   1. Chest Pain:  Resolved normal myovue 2015 and 2016    2. HTN: followed by PCP. Controlled on HCTZ and Cozaar   3. HLD: on statin.  4. DM: followed by PCP. On Metformin. Recent Hgb A1c controlled at 6.2.   5. VSD:  Restrictive with no signs of pulmonary hypertension f/u echo today reviewed And stable with good RV/LV function and no pulmonary hypertension   Jenkins Rouge

## 2015-10-02 ENCOUNTER — Other Ambulatory Visit: Payer: Self-pay | Admitting: Cardiovascular Disease

## 2015-10-02 ENCOUNTER — Ambulatory Visit (INDEPENDENT_AMBULATORY_CARE_PROVIDER_SITE_OTHER): Payer: Medicaid Other | Admitting: Cardiovascular Disease

## 2015-10-02 ENCOUNTER — Ambulatory Visit (HOSPITAL_COMMUNITY): Payer: Medicaid Other | Attending: Cardiology

## 2015-10-02 ENCOUNTER — Encounter: Payer: Self-pay | Admitting: Cardiovascular Disease

## 2015-10-02 ENCOUNTER — Other Ambulatory Visit: Payer: Self-pay

## 2015-10-02 ENCOUNTER — Encounter (INDEPENDENT_AMBULATORY_CARE_PROVIDER_SITE_OTHER): Payer: Self-pay

## 2015-10-02 VITALS — BP 148/80 | HR 73 | Resp 18 | Ht 67.0 in | Wt 190.0 lb

## 2015-10-02 DIAGNOSIS — R011 Cardiac murmur, unspecified: Secondary | ICD-10-CM

## 2015-10-02 DIAGNOSIS — Q210 Ventricular septal defect: Secondary | ICD-10-CM | POA: Insufficient documentation

## 2015-10-02 DIAGNOSIS — I10 Essential (primary) hypertension: Secondary | ICD-10-CM | POA: Diagnosis not present

## 2015-10-02 DIAGNOSIS — E119 Type 2 diabetes mellitus without complications: Secondary | ICD-10-CM | POA: Insufficient documentation

## 2015-10-02 DIAGNOSIS — E785 Hyperlipidemia, unspecified: Secondary | ICD-10-CM | POA: Insufficient documentation

## 2015-10-02 NOTE — Patient Instructions (Addendum)
Medication Instructions:  Your physician recommends that you continue on your current medications as directed. Please refer to the Current Medication list given to you today.  Labwork: NONE  Testing/Procedures: Your physician has requested that you have an echocardiogram in a year. Echocardiography is a painless test that uses sound waves to create images of your heart. It provides your doctor with information about the size and shape of your heart and how well your heart's chambers and valves are working. This procedure takes approximately one hour. There are no restrictions for this procedure.    Follow-Up: Your physician wants you to follow-up in: 12 months with Dr. Johnsie Cancel. You will receive a reminder letter in the mail two months in advance. If you don't receive a letter, please call our office to schedule the follow-up appointment.   If you need a refill on your cardiac medications before your next appointment, please call your pharmacy.

## 2015-10-14 ENCOUNTER — Ambulatory Visit (INDEPENDENT_AMBULATORY_CARE_PROVIDER_SITE_OTHER): Payer: Medicaid Other | Admitting: Internal Medicine

## 2015-10-14 ENCOUNTER — Encounter: Payer: Self-pay | Admitting: Internal Medicine

## 2015-10-14 VITALS — BP 152/79 | HR 93 | Temp 98.2°F | Wt 193.0 lb

## 2015-10-14 DIAGNOSIS — M25511 Pain in right shoulder: Secondary | ICD-10-CM

## 2015-10-14 DIAGNOSIS — Q210 Ventricular septal defect: Secondary | ICD-10-CM

## 2015-10-14 DIAGNOSIS — M7060 Trochanteric bursitis, unspecified hip: Secondary | ICD-10-CM

## 2015-10-14 DIAGNOSIS — E119 Type 2 diabetes mellitus without complications: Secondary | ICD-10-CM

## 2015-10-14 DIAGNOSIS — I10 Essential (primary) hypertension: Secondary | ICD-10-CM | POA: Diagnosis not present

## 2015-10-14 DIAGNOSIS — H918X3 Other specified hearing loss, bilateral: Secondary | ICD-10-CM | POA: Diagnosis not present

## 2015-10-14 DIAGNOSIS — H9193 Unspecified hearing loss, bilateral: Secondary | ICD-10-CM

## 2015-10-14 DIAGNOSIS — M25512 Pain in left shoulder: Secondary | ICD-10-CM | POA: Insufficient documentation

## 2015-10-14 DIAGNOSIS — Z7984 Long term (current) use of oral hypoglycemic drugs: Secondary | ICD-10-CM | POA: Diagnosis not present

## 2015-10-14 DIAGNOSIS — Z8739 Personal history of other diseases of the musculoskeletal system and connective tissue: Secondary | ICD-10-CM | POA: Diagnosis not present

## 2015-10-14 DIAGNOSIS — Z79899 Other long term (current) drug therapy: Secondary | ICD-10-CM | POA: Diagnosis not present

## 2015-10-14 LAB — POCT GLYCOSYLATED HEMOGLOBIN (HGB A1C): Hemoglobin A1C: 7.5

## 2015-10-14 LAB — GLUCOSE, CAPILLARY: GLUCOSE-CAPILLARY: 148 mg/dL — AB (ref 65–99)

## 2015-10-14 MED ORDER — NAPROXEN 500 MG PO TABS: 500.0000 mg | ORAL_TABLET | Freq: Two times a day (BID) | ORAL | 2 refills | Status: AC

## 2015-10-14 NOTE — Assessment & Plan Note (Signed)
Her hemoglobin A1c this visit was 7.5%. Most recent hemoglobin A1c in June of this year was 7.3%. She takes metformin 1000 mg daily and reports compliance with this medication. She was encouraged to eat a carb Smart diet in addition to exercise. -Continue metformin 1000 mg daily -Was encouraged to eat a carb Smart diet and exercise regularly as tolerated

## 2015-10-14 NOTE — Assessment & Plan Note (Signed)
>>  ASSESSMENT AND PLAN FOR HEARING LOSS WRITTEN ON 10/14/2015  4:50 PM BY MOLT, BETHANY, DO  Patient was born hearing however became deaf at age 60. She reports this developed after an episode of chickenpox or measles. She denies ever receiving a cochlear implant.

## 2015-10-14 NOTE — Assessment & Plan Note (Signed)
This is managed by cardiology. Most recent visit was 10/02/15.

## 2015-10-14 NOTE — Assessment & Plan Note (Signed)
Patient was born hearing however became deaf at age 60. She reports this developed after an episode of chickenpox or measles. She denies ever receiving a cochlear implant.

## 2015-10-14 NOTE — Assessment & Plan Note (Signed)
Patient reports a two-month history of right shoulder pain that was present upon awakening. Patient reports she sometimes sleeps in strange positions and it's not unusual for her to have shoulder pain after sleeping however it has persisted. She reports some pain and tingling on the lateral aspect of her shoulder however has had no problem with lifting objects. She has not tried any ice, heat, or any other over-the-counter therapies. On physical exam she was able to raise her arm over her head however was somewhat limited due to pain. -A trial of naproxen 500 mg twice daily with meals 1 week has been ordered. Patient was instructed to make an appointment with the clinic in 1-2 weeks if this therapy has not improved her pain and at that time we will refer her to sports medicine for an injection or could possibly perform the injection here. -Ice, heat. -Doubt rotator cuff tear at this time as patient has full range of motion and is only limited by pain. Suspect bursitis.

## 2015-10-14 NOTE — Assessment & Plan Note (Signed)
Patient reports compliance with her hydrochlorothiazide 25 mg and losartan 50 mg daily. Her initial blood pressure was slightly elevated at 149/82 however on repeat examination her blood pressure was 152/79. I encouraged patient to increase her losartan to 100 mg daily however she declined this change in medication as she felt her pain was contributing to her elevated blood pressure. Discussion was had on the importance of a low sodium diet and the importance of checking nutrition labels as hidden sodium is often found in many common foods. She reported understanding of this and will try to eat a low-sodium diet until her next visit. -We'll reassess blood pressure at next visit. Patient will likely need to have losartan increased to 100 mg daily if not normotensive. -Encouraged low-sodium diet and exercise.

## 2015-10-14 NOTE — Assessment & Plan Note (Signed)
Completely resolved at this time. Patient received a injection into her trochanteric bursa in June of this year with complete resolution of her symptoms. She was instructed by the sports medicine physician to return to the sports medicine clinic for repeat injection if her pain was to recur.

## 2015-10-14 NOTE — Progress Notes (Signed)
CC:  New right shoulder pain, follow-up evaluation of hypertension, diabetes, and trochanteric bursitis.  HPI:  [name] is a very pleasant deaf 60 y.o. female who presents to clinic for evaluation of hypertension, diabetes, trochanteric bursitis, and a 2 month history of right shoulder pain.   In regards to her muscular skeletal complaints, the patient was referred to sports medicine for evaluation of trochanteric bursitis in June of this year. They injected a steroid into the bursa with complete resolution of her symptoms. She is instructed to return to sports medicine clinic for another injection if her pain returns. In regards to her right shoulder pain, patient reports this started approximately 2 months ago after waking up. She reports she sometimes sleeps in funny position and is not unusual for her to have shoulder pain after this however the shoulder pain has persisted and has not improved during this time. She reports the pain is mostly located on the anterolateral aspect of her right shoulder with radiation down to the mid lateral elbow. She describes the pain as sharp in nature and reports associated tingling on the lateral aspect of her shoulder. She denies any trauma to this area. She has not tried any over-the-counter medications or any ice or heat.  For management of her hypertension, the patient takes hydrochlorothiazide 25 mg daily in addition to losartan 50 mg daily. She reports compliance of these medications and tries to eat a low-sodium diet. In regards to her diabetes the patient takes metformin 1000 mg daily. Her most recent hemoglobin A1c was 7.3% in June of this year we will repeat this today. Chart review shows hemoglobin A1c less than 7.5% for the last 4 years. She reports she gets an annual ophthalmologic examination, upcoming examination February 2018.  Patient has history of ventral septal defect managed by cardiology with most recent visit 9/28 of this year. She  denies any chest pain, shortness of breath or fatigue.  Past Medical History:  Diagnosis Date  . Atypical chest pain    myoview 10/29/10 - Post-stress EF 56%. Low risk and negative for ischemia   . Back pain   . Back pain   . 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 Chronic PID   . Chronic PID (chronic pelvic inflammatory disease)   . Deaf    Since age 44  . Diabetes mellitus   . Dizziness   . Dyslipidemia   . Dyspnea   . History of Doppler ultrasound 06/20/09   Normal, no prior studies for comparison.   . Hypertension   . Lightheadedness   . Mute   . Obese   . URI (upper respiratory infection)   . Ventricular septal defect 2004 per echo   small membranous VSD    Review of Systems:  Review of Systems  Constitutional: Negative for chills, fever and weight loss.  Respiratory: Negative for cough and shortness of breath.   Cardiovascular: Negative for chest pain, palpitations and leg swelling.  Gastrointestinal: Negative for abdominal pain, blood in stool, diarrhea, nausea and vomiting.  Musculoskeletal: Positive for joint pain and myalgias. Negative for falls and neck pain.  Neurological: Positive for tingling. Negative for dizziness, sensory change and headaches.   Physical Exam: Physical Exam  Constitutional: She is well-developed, well-nourished, and in no distress.  HENT:  Head: Normocephalic and atraumatic.  Neck: Normal range of motion.  Cardiovascular: Normal rate, regular rhythm and intact distal pulses.  Exam reveals  no friction rub.   Pulmonary/Chest: Effort normal and breath sounds normal. No respiratory distress. She has no wheezes.  Abdominal: Soft. Bowel sounds are normal. She exhibits no distension. There is no tenderness.  Musculoskeletal: She exhibits tenderness. She exhibits no edema or deformity.  Tenderness to palpation of anterior shoulder joint. ROM limited secondary to pain. No gross deformity.     Neurological: She exhibits normal muscle tone.  Skin: Skin is warm and dry. She is not diaphoretic.   Vitals:   10/14/15 1319 10/14/15 1356  BP: (!) 149/82 (!) 152/79  Pulse: 93   Temp: 98.2 F (36.8 C)     See Encounters Tab for problem based charting.  Patient seen with Dr. Dareen Piano

## 2015-10-14 NOTE — Patient Instructions (Addendum)
It was a pleasure meeting you today! I look forward to working with you in the upcoming several years. 1. Today we talked about her diabetes. You are doing well with management of this condition. Hemoglobin A1c was 7.5%. Please continue taking metformin 500 mg twice a day. Please keep your appointment with her ophthalmologist for your annual eye exam in February of next year. 2. Today we talked about your hypertension. Her initial blood pressure today was 149/82. On repeat measurement it was 152/79. Please continue taking your hydrochlorothiazide 25 mg and losartan 50 mg daily. Please try to eat a reduced sodium diet and exercise. We will recheck this at your next visit to ensure it was secondary to pain. 3. Today we talked about her trochanteric bursitis. I'm very glad that your symptoms have resolved. Please follow-up with the sports medicine clinic if her pain returns for another injection. 4. Today we talked about your two-month history of right shoulder pain and stiffness. For this I am prescribing you a medication called Naproxen. Please take this every day as directed for 1 week to see if it improves your pain. Please return to the clinic in 1-2 weeks if your pain does not improve and we will likely refer you again to sports medicine for an injection. I have sent your prescription to [facility]. 5. For your toe nails, you may buy an over the counter medication called Lamisil. Please apply this as directed on the package for 6 weeks.

## 2015-10-15 NOTE — Progress Notes (Signed)
Internal Medicine Clinic Attending  I saw and evaluated the patient.  I personally confirmed the key portions of the history and exam documented by Dr. Molt and I reviewed pertinent patient test results.  The assessment, diagnosis, and plan were formulated together and I agree with the documentation in the resident's note. 

## 2015-10-22 ENCOUNTER — Ambulatory Visit (INDEPENDENT_AMBULATORY_CARE_PROVIDER_SITE_OTHER): Payer: Medicaid Other | Admitting: Internal Medicine

## 2015-10-22 ENCOUNTER — Ambulatory Visit (HOSPITAL_COMMUNITY)
Admission: RE | Admit: 2015-10-22 | Discharge: 2015-10-22 | Disposition: A | Discharge status: Home / Self Care | Payer: Medicaid Other | Source: Ambulatory Visit | Attending: Oncology | Admitting: Oncology

## 2015-10-22 ENCOUNTER — Other Ambulatory Visit: Payer: Self-pay | Admitting: Internal Medicine

## 2015-10-22 VITALS — BP 139/87 | HR 88 | Temp 98.2°F | Wt 194.8 lb

## 2015-10-22 DIAGNOSIS — M5010 Cervical disc disorder with radiculopathy, unspecified cervical region: Secondary | ICD-10-CM

## 2015-10-22 DIAGNOSIS — M47812 Spondylosis without myelopathy or radiculopathy, cervical region: Secondary | ICD-10-CM | POA: Diagnosis not present

## 2015-10-22 DIAGNOSIS — M542 Cervicalgia: Secondary | ICD-10-CM

## 2015-10-22 DIAGNOSIS — M2578 Osteophyte, vertebrae: Secondary | ICD-10-CM | POA: Diagnosis not present

## 2015-10-22 DIAGNOSIS — M25512 Pain in left shoulder: Secondary | ICD-10-CM

## 2015-10-22 NOTE — Progress Notes (Signed)
   CC: left shoulder pain  HPI:  Janice Massey is a 60 y.o. woman with history of DM2 and HTN who presents with chronic left shoulder pain.  She was seen 1 week ago for shoulder pain which has now persisted for 2 months.  She was given 1 week of naproxen which has not improved her pain.  She notices pain with movement and at night, and has been attempting to sleep on her right side to keep pressure off her left shoulder.  Pain when combing her hair or attempting to put on clothes.  Also has numbness in lateral surface of her shoulder.  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  Cervical radiographs from 2010 showed degenerative disease.   Past Medical History:  Diagnosis Date  . Atypical chest pain    myoview 10/29/10 - Post-stress EF 56%. Low risk and negative for ischemia   . Back pain   . Back pain   . 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 Chronic PID   . Chronic PID (chronic pelvic inflammatory disease)   . Deaf    Since age 68  . Diabetes mellitus   . Dizziness   . Dyslipidemia   . Dyspnea   . History of Doppler ultrasound 06/20/09   Normal, no prior studies for comparison.   . Hypertension   . Lightheadedness   . Mute   . Obese   . URI (upper respiratory infection)   . Ventricular septal defect 2004 per echo   small membranous VSD    Review of Systems:  Review of Systems  Musculoskeletal: Positive for joint pain, myalgias and neck pain.  Neurological: Positive for sensory change. Negative for tingling and focal weakness.     Physical Exam:  Vitals:   10/22/15 1014  BP: 139/87  Pulse: 88  Temp: 98.2 F (36.8 C)  TempSrc: Oral  Weight: 194 lb 12.8 oz (88.4 kg)   Physical Exam  Constitutional: She is oriented to person, place, and time. She appears well-developed and well-nourished. No  distress.  Neck: Normal range of motion. Neck supple.  Mildline tenderness to palpation C5-T1  Musculoskeletal:  Tenderness to palpation over glenohumeral joing, lateral and posterior shoulder  Minimal anterior tenderness over clavicle and AC joint Pain with abduction, external rotation, empty can Active ROM limited by pain  Neurological: She is alert and oriented to person, place, and time.  Normal DTRs biceps, brachioradialis, triceps Reduced sensation to light touch over lateral shoulder  Strength normal, limited by pain Shooting pain in arm reproducing symtpoms with Spurling test  Skin: Skin is warm and dry.  Psychiatric: She has a normal mood and affect. Her behavior is normal.     Assessment & Plan:   See Encounters Tab for problem based charting.  Patient seen with Dr. Beryle Beams

## 2015-10-22 NOTE — Assessment & Plan Note (Addendum)
Her exam has features suggestive of both musculoskeletal and radicular pain.  Pain with palpation, abduction, and internal rotation consistent with rotator cuff tendinopathy.  However, history of cervical nerve root impingement, lateral numbness, positive Spurling test, and C-spine tenderness to palpation over lower cervical vertebrae are consistent with radiculopathy.  She may well have both processes contribution. -Radiographs of C spine -MRI neck if evidence of degenerative disease -Continue naproxen -F/u 3-4 weeks -Consider intraarticular steroid

## 2015-10-22 NOTE — Patient Instructions (Addendum)
You were seen today for neck and shoulder pain.  This pain could be from a pinched nerve in your neck, from injury to a muscle or tendon in your shoulder, or both.  I will order X-rays of your neck to see if you have arthritis in your spine, and, if you do, will order an MRI to see if any nerves are getting pinched.  Keep taking the naproxen twice daily.  This will help with inflammation in the muscles.  We will see you back in 3-4 weeks after we have the X-rays and MRIs to see how you are doing.

## 2015-10-23 ENCOUNTER — Encounter: Payer: Self-pay | Admitting: Internal Medicine

## 2015-10-23 NOTE — Progress Notes (Signed)
Medicine attending: I personally interviewed and briefly examined this patient on the day of the patient visit and reviewed pertinent clinical ,laboratory, and radiographic data  with resident physician Dr. Minus Liberty and we discussed a management plan. Deaf lady. Left arm and neck pain. Worse with palpation over C-spine and palpation over AC joint. Pain on rotation at joint. 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 Motor strength 5/5. Reflex normal at biceps/triceps/brachoradialis. We will check cervical X-Rays today. She will likely need MRI. Continue recently started  NSAID for now

## 2015-11-12 ENCOUNTER — Ambulatory Visit: Payer: Medicaid Other

## 2015-11-12 ENCOUNTER — Encounter: Payer: Self-pay | Admitting: Internal Medicine

## 2015-11-16 ENCOUNTER — Other Ambulatory Visit: Payer: Self-pay | Admitting: Cardiovascular Disease

## 2015-11-16 DIAGNOSIS — I10 Essential (primary) hypertension: Secondary | ICD-10-CM

## 2015-12-06 ENCOUNTER — Encounter (HOSPITAL_COMMUNITY): Payer: Self-pay | Admitting: Emergency Medicine

## 2015-12-06 ENCOUNTER — Emergency Department (HOSPITAL_COMMUNITY)
Admission: EM | Admit: 2015-12-06 | Discharge: 2015-12-06 | Disposition: A | Discharge status: Home / Self Care | Payer: Self-pay | Attending: Emergency Medicine | Admitting: Emergency Medicine

## 2015-12-06 DIAGNOSIS — R300 Dysuria: Secondary | ICD-10-CM | POA: Insufficient documentation

## 2015-12-06 DIAGNOSIS — R1030 Lower abdominal pain, unspecified: Secondary | ICD-10-CM | POA: Insufficient documentation

## 2015-12-06 DIAGNOSIS — R102 Pelvic and perineal pain: Secondary | ICD-10-CM

## 2015-12-06 DIAGNOSIS — Z79899 Other long term (current) drug therapy: Secondary | ICD-10-CM | POA: Insufficient documentation

## 2015-12-06 LAB — URINALYSIS, ROUTINE W REFLEX MICROSCOPIC
Bilirubin Urine: NEGATIVE
GLUCOSE, UA: NEGATIVE mg/dL
KETONES UR: NEGATIVE mg/dL
LEUKOCYTES UA: NEGATIVE
NITRITE: NEGATIVE
PH: 5.5 (ref 5.0–8.0)
Protein, ur: NEGATIVE mg/dL
SPECIFIC GRAVITY, URINE: 1.026 (ref 1.005–1.030)

## 2015-12-06 LAB — COMPREHENSIVE METABOLIC PANEL
ALT: 11 U/L — ABNORMAL LOW (ref 14–54)
ANION GAP: 8 (ref 5–15)
AST: 16 U/L (ref 15–41)
Albumin: 4.4 g/dL (ref 3.5–5.0)
Alkaline Phosphatase: 60 U/L (ref 38–126)
BILIRUBIN TOTAL: 1 mg/dL (ref 0.3–1.2)
BUN: 17 mg/dL (ref 6–20)
CHLORIDE: 106 mmol/L (ref 101–111)
CO2: 28 mmol/L (ref 22–32)
Calcium: 9.3 mg/dL (ref 8.9–10.3)
Creatinine, Ser: 0.63 mg/dL (ref 0.44–1.00)
GFR calc Af Amer: >60 mL/min (ref >60)
Glucose, Bld: 103 mg/dL — ABNORMAL HIGH (ref 65–99)
POTASSIUM: 3.7 mmol/L (ref 3.5–5.1)
Sodium: 142 mmol/L (ref 135–145)
TOTAL PROTEIN: 7.2 g/dL (ref 6.5–8.1)

## 2015-12-06 LAB — URINE MICROSCOPIC-ADD ON

## 2015-12-06 LAB — CBC
HEMATOCRIT: 36.4 % (ref 36.0–46.0)
HEMOGLOBIN: 12.4 g/dL (ref 12.0–15.0)
MCH: 31.9 pg (ref 26.0–34.0)
MCHC: 34.1 g/dL (ref 30.0–36.0)
MCV: 93.6 fL (ref 78.0–100.0)
Platelets: 255 10*3/uL (ref 150–400)
RBC: 3.89 MIL/uL (ref 3.87–5.11)
RDW: 13 % (ref 11.5–15.5)
WBC: 5.8 10*3/uL (ref 4.0–10.5)

## 2015-12-06 LAB — LIPASE, BLOOD: LIPASE: 17 U/L (ref 11–51)

## 2015-12-06 MED ORDER — CEPHALEXIN 500 MG PO CAPS: 500.0000 mg | ORAL_CAPSULE | Freq: Two times a day (BID) | ORAL | 0 refills | Status: DC

## 2015-12-06 MED ORDER — ONDANSETRON 8 MG PO TBDP
8.0000 mg | ORAL_TABLET | Freq: Once | ORAL | Status: AC
  Administered 2015-12-06: 8 mg via ORAL
  Filled 2015-12-06: qty 1

## 2015-12-06 MED ORDER — SODIUM CHLORIDE 0.9 % IV BOLUS (SEPSIS): 1000.0000 mL | Freq: Once | INTRAVENOUS | Status: DC

## 2015-12-06 MED ORDER — GI COCKTAIL ~~LOC~~
30.0000 mL | Freq: Once | ORAL | Status: AC
  Administered 2015-12-06: 30 mL via ORAL
  Filled 2015-12-06: qty 30

## 2015-12-06 MED ORDER — ONDANSETRON HCL 4 MG/2ML IJ SOLN
4.0000 mg | Freq: Once | INTRAMUSCULAR | Status: DC
Start: 2015-12-06 — End: 2015-12-06
  Filled 2015-12-06: qty 2

## 2015-12-06 MED ORDER — MORPHINE SULFATE (PF) 4 MG/ML IV SOLN
4.0000 mg | Freq: Once | INTRAVENOUS | Status: DC
  Filled 2015-12-06: qty 1

## 2015-12-06 MED ORDER — ONDANSETRON 4 MG PO TBDP: 4.0000 mg | ORAL_TABLET | Freq: Three times a day (TID) | ORAL | 0 refills | Status: DC | PRN

## 2015-12-06 MED ORDER — CEPHALEXIN 500 MG PO CAPS
500.0000 mg | ORAL_CAPSULE | Freq: Once | ORAL | Status: AC
  Administered 2015-12-06: 500 mg via ORAL
  Filled 2015-12-06: qty 1

## 2015-12-06 NOTE — ED Notes (Signed)
Patient d/c'd self care.  F/U and medications reviewed.  Patient verbalized understanding. 

## 2015-12-06 NOTE — ED Notes (Signed)
IV ATTEMPT X2 UNSUCCESSFUL.

## 2015-12-06 NOTE — ED Provider Notes (Signed)
[facility]-EMERGENCY DEPT Provider Note   CSN: [id number] Arrival date & time: 12/06/15  1254     History   Chief Complaint Chief Complaint  Patient presents with  . Abdominal Pain    HPI Brandi Mendez is a 60 y.o. female.  Patient is a 60 year old female with past medical history of small bowel obstruction and surgical history of abdominal hernia repair, abdominal hysterectomy, and cholecystectomy, presenting with chief complaint of mild suprapubic abdominal pain for the past 5 days. Symptoms have been constant throughout the week but not worsening. She vomited on Tuesday, but has not vomited since. Her bowel movements have been regular, and she denies any constipation, diarrhea, or blood in her stools. She also reports burning with urination, but denies any fever, chills, or flank pain. She has been tolerating fluids and trying to eat soup. She tried taking ibuprofen and Tylenol but it provided minimal relief. She denies any chest pain, shortness of breath, back pain, dizziness, or syncope. No personal or family history or AAA.       Past Medical History:  Diagnosis Date  . Small bowel obstruction 2013, 2015    Patient Active Problem List   Diagnosis Date Noted  . Rash 01/03/2015  . Abdominal pain, unspecified site 08/16/2012  . Periauricular adenopathy 07/28/2012  . Dyspepsia 06/01/2012  . BREAST PAIN, LEFT 04/16/2009  . Constipation 01/13/2009  . HYPERTRIGLYCERIDEMIA, MILD 12/26/2006  . OBESITY, UNSPECIFIED 12/26/2006    Past Surgical History:  Procedure Laterality Date  . ABDOMINAL HERNIA REPAIR    . [facility]  . CHOLECYSTECTOMY    . Left ankle surgery      OB History    No data available       Home Medications    Prior to Admission medications   Medication Sig Start Date End Date Taking? Authorizing Provider  cephALEXin (KEFLEX) 250 MG capsule Take 1 capsule (250 mg total) by mouth 4 (four) times daily. 08/20/15   Kristen N Ward,  DO  dicyclomine (BENTYL) 20 MG tablet Take 1 tablet (20 mg total) by mouth 2 (two) times daily. As needed for abdominal pain 08/20/15   Kristen N Ward, DO  docusate sodium (COLACE) 100 MG capsule Take 1 capsule (100 mg total) by mouth every 12 (twelve) hours. 08/20/15   Kristen N Ward, DO  polyethylene glycol (MIRALAX) packet Take 17 g by mouth daily. 08/20/15   Layla MawKristen N Ward, DO    Family History No family history on file.  Social History Social History  Substance Use Topics  . Smoking status: Never Smoker  . Smokeless tobacco: Never Used  . Alcohol use Yes     Comment: sometimes     Allergies   Patient has no known allergies.   Review of Systems Review of Systems  All other systems reviewed and are negative.    Physical Exam Updated Vital Signs BP 139/97 (BP Location: Left Arm)   Pulse (!) 52   Temp 98 F (36.7 C) (Oral)   Resp 16   Wt 74.4 kg   SpO2 100%   BMI 30.00 kg/m   Physical Exam  Constitutional: She appears well-developed and well-nourished.  Pleasant, non-toxic appearance, no acute distress.  HENT:  Head: Normocephalic and atraumatic.  Mouth/Throat: Oropharynx is clear and moist.  Eyes: Conjunctivae are normal.  Neck: Normal range of motion.  Cardiovascular: Normal rate, regular rhythm, normal heart sounds and intact distal pulses.   Pulmonary/Chest: Effort normal and  breath sounds normal. No respiratory distress.  Abdominal: Soft. Bowel sounds are normal. She exhibits no distension. There is tenderness. There is no guarding.  Mild suprapubic TTP. Negative Murphy's, negative McBurney's, no CVA tenderness.  Musculoskeletal: Normal range of motion. She exhibits no edema or tenderness.  Neurological: She is alert.  Skin: Skin is warm and dry.  Psychiatric: She has a normal mood and affect.  Nursing note and vitals reviewed.    ED Treatments / Results  Labs (all labs ordered are listed, but only abnormal results are displayed) Labs Reviewed    COMPREHENSIVE METABOLIC PANEL - Abnormal; Notable for the following:       Result Value   Glucose, Bld 103 (*)    ALT 11 (*)    All other components within normal limits  URINALYSIS, ROUTINE W REFLEX MICROSCOPIC (NOT AT [facility]) - Abnormal; Notable for the following:    Hgb urine dipstick SMALL (*)    All other components within normal limits  URINE MICROSCOPIC-ADD ON - Abnormal; Notable for the following:    Squamous Epithelial / LPF 0-5 (*)    Bacteria, UA FEW (*)    All other components within normal limits  LIPASE, BLOOD  CBC    EKG  EKG Interpretation None       Radiology No results found.  Procedures Procedures (including critical care time)  Medications Ordered in ED Medications  ondansetron (ZOFRAN-ODT) disintegrating tablet 8 mg (8 mg Oral Given 12/06/15 1459)  gi cocktail (Maalox,Lidocaine,Donnatal) (30 mLs Oral Given 12/06/15 1459)  cephALEXin (KEFLEX) capsule 500 mg (500 mg Oral Given 12/06/15 1459)     Initial Impression / Assessment and Plan / ED Course  I have reviewed the triage vital signs and the nursing notes.  Pertinent labs & imaging results that were available during my care of the patient were reviewed by me and considered in my medical decision making (see chart for details).  Clinical Course    Patient is a 60 year old female presenting with mild suprapubic abdominal pain for the past 5 days. She also reports burning with urination, but denies any fevers, chills, or flank pain. While she has a history of small bowel obstruction, she's been eating and drinking without difficulty, and having regular bowel movements. Unremarkable abdominal exam with mild suprapubic TTP only. I do not suspect acute abdominal pathology, and CT abdomen deferred per discussion with attending Dr. Jacalyn LefevreJulie Haviland. Labs including CBC, CMP, and lipase are unremarkable. Urinalysis does not show overwhelming evidence of infection, but given 6-30 WBC with few bacteria, will culture  and treat with empiric Keflex. GI cocktail given for pain with Zofran ODT, and patient tolerated PO challenge. Stable for discharge home with follow-up to PCP, prescription Keflex and Zofran ODT, with return precautions for fever, abdominal pain, vomiting, dysuria, flank pain, or any worsening symptoms.  Final Clinical Impressions(s) / ED Diagnoses   Final diagnoses:  Suprapubic abdominal pain  Dysuria    New Prescriptions Discharge Medication List as of 12/06/2015  3:48 PM    START taking these medications   Details  ondansetron (ZOFRAN ODT) 4 MG disintegrating tablet Take 1 tablet (4 mg total) by mouth every 8 (eight) hours as needed for nausea or vomiting., Starting Sat 12/06/2015, Print         Ivonne AndrewDaryl F de BracevilleVillier II, [place] 12/08/15 2141    Jacalyn LefevreJulie Haviland, MD 12/09/15 1538

## 2015-12-06 NOTE — Discharge Instructions (Signed)
Please start taking the prescription Keflex tomorrow, and Zofran as needed for nausea. Complete the full course of antibiotics. Please follow-up with your PCP in 2 days for reevaluation, or return to the ED for any new or worsening symptoms.

## 2015-12-06 NOTE — ED Notes (Signed)
WATER AND SALTINE CRACKERS GIVEN FOR PO CHALLENGE. 

## 2015-12-06 NOTE — ED Notes (Signed)
Patient unable to sign d/c papers in room due to signature pad not working.  Discussed D/C paperwork and prescriptions with patient.  Patient verbalized understanding.

## 2015-12-06 NOTE — ED Triage Notes (Signed)
Pt complaint of of generalized abdominal pain with associated n/v denies diarrhea for a week. Pt denies vomiting today.

## 2015-12-06 NOTE — ED Notes (Addendum)
Patient able to eat crackers ad drink fluids without issues.  Will notify MD

## 2016-01-01 ENCOUNTER — Other Ambulatory Visit: Payer: Self-pay | Admitting: Internal Medicine

## 2016-01-02 ENCOUNTER — Other Ambulatory Visit: Payer: Self-pay | Admitting: Oncology

## 2016-02-06 ENCOUNTER — Telehealth: Payer: Self-pay | Admitting: Internal Medicine

## 2016-02-06 NOTE — Telephone Encounter (Signed)
APT. REMINDER CALL, NO ANSWER, NO VOCIEMAIL °

## 2016-02-09 ENCOUNTER — Encounter: Payer: Self-pay | Admitting: Internal Medicine

## 2016-02-09 ENCOUNTER — Ambulatory Visit (INDEPENDENT_AMBULATORY_CARE_PROVIDER_SITE_OTHER): Payer: Medicaid Other | Admitting: Internal Medicine

## 2016-02-09 ENCOUNTER — Encounter (INDEPENDENT_AMBULATORY_CARE_PROVIDER_SITE_OTHER): Payer: Self-pay

## 2016-02-09 VITALS — BP 148/83 | HR 84 | Temp 98.0°F | Wt 195.6 lb

## 2016-02-09 DIAGNOSIS — Z79899 Other long term (current) drug therapy: Secondary | ICD-10-CM | POA: Diagnosis not present

## 2016-02-09 DIAGNOSIS — M7062 Trochanteric bursitis, left hip: Secondary | ICD-10-CM

## 2016-02-09 DIAGNOSIS — E785 Hyperlipidemia, unspecified: Secondary | ICD-10-CM | POA: Diagnosis not present

## 2016-02-09 DIAGNOSIS — M25511 Pain in right shoulder: Secondary | ICD-10-CM

## 2016-02-09 DIAGNOSIS — Z7984 Long term (current) use of oral hypoglycemic drugs: Secondary | ICD-10-CM | POA: Diagnosis not present

## 2016-02-09 DIAGNOSIS — I10 Essential (primary) hypertension: Secondary | ICD-10-CM | POA: Diagnosis not present

## 2016-02-09 DIAGNOSIS — M7060 Trochanteric bursitis, unspecified hip: Secondary | ICD-10-CM

## 2016-02-09 DIAGNOSIS — Z23 Encounter for immunization: Secondary | ICD-10-CM | POA: Diagnosis not present

## 2016-02-09 DIAGNOSIS — E119 Type 2 diabetes mellitus without complications: Secondary | ICD-10-CM

## 2016-02-09 DIAGNOSIS — E7800 Pure hypercholesterolemia, unspecified: Secondary | ICD-10-CM

## 2016-02-09 DIAGNOSIS — M25512 Pain in left shoulder: Secondary | ICD-10-CM

## 2016-02-09 LAB — GLUCOSE, CAPILLARY: Glucose-Capillary: 171 mg/dL — ABNORMAL HIGH (ref 65–99)

## 2016-02-09 LAB — POCT GLYCOSYLATED HEMOGLOBIN (HGB A1C): HEMOGLOBIN A1C: 7.7

## 2016-02-09 MED ORDER — MELOXICAM 7.5 MG PO TABS: 7.5000 mg | ORAL_TABLET | Freq: Every day | ORAL | 2 refills | Status: DC

## 2016-02-09 MED ORDER — ATORVASTATIN CALCIUM 40 MG PO TABS: 40.0000 mg | ORAL_TABLET | Freq: Every day | ORAL | 3 refills | Status: DC

## 2016-02-09 MED ORDER — HYDROCHLOROTHIAZIDE 25 MG PO TABS: 25.0000 mg | ORAL_TABLET | Freq: Every day | ORAL | 0 refills | Status: DC

## 2016-02-09 MED ORDER — OMEPRAZOLE 40 MG PO CPDR: 40.0000 mg | DELAYED_RELEASE_CAPSULE | Freq: Every day | ORAL | 1 refills | Status: DC

## 2016-02-09 MED ORDER — LOSARTAN POTASSIUM 100 MG PO TABS: 50.0000 mg | ORAL_TABLET | Freq: Every day | ORAL | 0 refills | Status: DC

## 2016-02-09 NOTE — Patient Instructions (Addendum)
It was a pleasure seeing you again today! I'm glad you are doing well!  1. Today we talked about your blood pressure. It is elevated today and it was elevated last time you were here. I believe the best thing to do is to increase your Losartan to 100mg  daily. Please check your blood pressure at home and see if it is better controlled with this regimen.  2. Today we talked about your diabetes. For this we checked a test called the HbA1c which checks your average blood sugar over the last 3 months. I do not yet have the results but I would like to continue you on Metformin 1000mg  twice daily.  3. Today we also talked about your shoulder and hip pain. I am sorry you are still experiencing this! For this, I am starting you on a medication called Meloxicam. Take one of these pills every day. This medication is an NSAID but it works 24 hours, and works best when you take it daily. It has tendency to upset your stomach, so I have started you on a medication called Prilosec.  4. Please call and make an appointment with the physician that did your injection last time!!!! See me again in 3 months!

## 2016-02-09 NOTE — Progress Notes (Signed)
   CC: follow-up of hypertension and diabetes  HPI:  [name] is a 61 y.o. F who presents for evaluation of HTN, DM and MSK  pain.   HTN: On Losartan 50 mg and HCTZ 25 mg daily. BP uncontrolled at last visit however gave patient opportunity for better control with diet, exercise and reducing sodium. BP continues to remain elevated today.   DM2: On Metformin 2000mg  daily. Last HbA1c 7.5% 10/2015, 7.7% today  Right shoulder pain + L. Trochanteric bursitis: Continues to complain of pain and these sites. Found some significant relief with steroidal injections with sports medicine. Has been trying heat, ice and ibuprofen with some improvement however notes she has difficulty sleeping and completing some of her adls due to above.   Past Medical History:  Diagnosis Date  . Atypical chest pain    myoview 10/29/10 - Post-stress EF 56%. Low risk and negative for ischemia   . Back pain   . Back pain   . 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 Chronic PID   . Chronic PID (chronic pelvic inflammatory disease)   . Deaf    Since age 86  . Diabetes mellitus   . Dizziness   . Dyslipidemia   . Dyspnea   . History of Doppler ultrasound 06/20/09   Normal, no prior studies for comparison.   . Hypertension   . Lightheadedness   . Mute   . Obese   . URI (upper respiratory infection)   . Ventricular septal defect 2004 per echo   small membranous VSD   Review of Systems:  Review of Systems  Constitutional: Negative for chills, fever, malaise/fatigue and weight loss.  Eyes: Negative for blurred vision and double vision.  Respiratory: Negative for cough and shortness of breath.   Cardiovascular: Negative for chest pain, leg swelling and PND.  Gastrointestinal: Negative for abdominal pain, nausea and vomiting.  Neurological: Negative for tingling, sensory change and headaches.   Physical Exam: Physical Exam  Constitutional: She appears  well-developed and well-nourished. No distress.  HENT:  Head: Normocephalic and atraumatic.  Eyes: Pupils are equal, round, and reactive to light. No scleral icterus.  Cardiovascular: Normal rate and regular rhythm.   Murmur heard. Pulmonary/Chest: Effort normal and breath sounds normal. No respiratory distress.  Abdominal: Soft. Bowel sounds are normal. She exhibits no distension.  Musculoskeletal: She exhibits tenderness. She exhibits no edema or deformity.  Neurological: No sensory deficit. She exhibits normal muscle tone.  Skin: Skin is warm and dry. She is not diaphoretic.    Vitals:   02/09/16 1319  BP: (!) 148/83  Pulse: 84  Temp: 98 F (36.7 C)  TempSrc: Oral  SpO2: 99%  Weight: 195 lb 9.6 oz (88.7 kg)   Assessment & Plan:   See Encounters Tab for problem based charting.  Patient discussed with Dr. Dareen Piano

## 2016-02-10 ENCOUNTER — Encounter: Payer: Medicaid Other | Admitting: Internal Medicine

## 2016-02-10 NOTE — Assessment & Plan Note (Signed)
Compliant with Lipitor 40 mg. Reports she takes it at night. Denied any myalgias.

## 2016-02-10 NOTE — Assessment & Plan Note (Addendum)
Lab Results  Component Value Date   HGBA1C 7.7 02/09/2016   HGBA1C 7.5 10/14/2015   HGBA1C 7.3 06/18/2015     Assessment: Diabetes control:  uncontrolled Progress toward A1C goal:   decreasing Comments: Patient currently on Metformin 2000 mg daily.   Plan: Medications: Continue metformin 2000 mg daily however encouraged stricter compliance with a diabetic friendly diet.  Recheck in 3 months, if near or >8, will need to start another agent such as Glipizide  Has ophthalmology appointment next week  Diabetic foot exam performed today

## 2016-02-10 NOTE — Assessment & Plan Note (Signed)
BP Readings from Last 3 Encounters:  02/09/16 (!) 148/83  10/22/15 139/87  10/14/15 (!) 152/79    Lab Results  Component Value Date   NA 140 04/30/2015   K 3.4 (L) 04/30/2015   CREATININE 0.68 04/30/2015    Assessment: Blood pressure control:  fair   Plan: Medications: Increasing Losartan to 100 mg daily. Will continue HCTZ at 25 mg.  Other plans: Diet, exercise, limiting sodium.

## 2016-02-10 NOTE — Progress Notes (Signed)
Internal Medicine Clinic Attending  Case discussed with Dr. Molt at the time of the visit.  We reviewed the resident's history and exam and pertinent patient test results.  I agree with the assessment, diagnosis, and plan of care documented in the resident's note. 

## 2016-02-10 NOTE — Assessment & Plan Note (Signed)
Complains of return of symptoms. Last injection approximately 4 months ago with sports med and provided her with significant relief. Pt requests that we assist in making a follow-up appointment for her.  -Apt being scheduled for sports med -Will also address shoulder pain as well -Have prescribed low dose Meloxicam until able to be seen

## 2016-02-10 NOTE — Assessment & Plan Note (Signed)
Pain consistent with MSK and likely lower cervical radicular pain. Scheduling a follow up appointment with sports med -Recommended heat, ice, NSAID -Consider MRI neck

## 2016-02-16 ENCOUNTER — Other Ambulatory Visit: Payer: Self-pay | Admitting: Internal Medicine

## 2016-02-16 DIAGNOSIS — M7060 Trochanteric bursitis, unspecified hip: Secondary | ICD-10-CM

## 2016-02-17 ENCOUNTER — Encounter: Payer: Self-pay | Admitting: Internal Medicine

## 2016-03-05 ENCOUNTER — Encounter: Payer: Self-pay | Admitting: Family Medicine

## 2016-03-05 ENCOUNTER — Ambulatory Visit (INDEPENDENT_AMBULATORY_CARE_PROVIDER_SITE_OTHER): Payer: Medicaid Other | Admitting: Family Medicine

## 2016-03-05 VITALS — BP 135/86 | Ht 67.0 in | Wt 180.0 lb

## 2016-03-05 DIAGNOSIS — M25512 Pain in left shoulder: Secondary | ICD-10-CM

## 2016-03-05 DIAGNOSIS — M25551 Pain in right hip: Secondary | ICD-10-CM | POA: Diagnosis not present

## 2016-03-05 NOTE — Progress Notes (Signed)
Janice Massey - 61 y.o. female MRN [id number]  Date of birth: 01-Jun-1955    SUBJECTIVE:     Office visit was conducted with the assistance of an interpreter for the deaf. Chief Complaint:/ HPI:   #1. Left shoulder pain. She is right-hand dominant. Left shoulder bothers her anytime she tries to move it. He also bothers her at night. Pain is aching, 6 out of 10 most of the time, 8 out of 10 with certain activities. She cannot exactly recall which activities make it hurt the worst. She thinks possibly overhead motion. She's noted no swelling of the shoulder. No specific shoulder injury. She requests some type of surgical intervention as her shoulder is bothering her that much. #2. Right hip pain. I had seen her for this several months ago and gave her an injection in her greater trochanteric bursa. She said that did not help at all. Says the pain is 8 out of 10. She wants to consider some type of surgical intervention because the pain is so bad. It hurts into the groin area, hurts when she sits or bears weight. Also bothers her she tries to climb stairs.   ROS:     No other unusual arthralgias or myalgias. No unusual weight loss. No fevers. No numbness in her hands or feet. See history of present illness above for additional pertinent review of systems.  PERTINENT  PMH / PSH FH / / SH:  Past Medical, Surgical, Social, and Family History Reviewed & Updated in the EMR.  Pertinent findings include:  Deaf Diabetes mellitus Hypertension Latex allergy status Asthma VSD with systolic murmur Surgical procedures include cystectomy, tubal ligation and cardiac catheterization. Never smoker  Current Outpatient Prescriptions on File Prior to Visit  Medication Sig Dispense Refill  . aspirin 81 MG tablet Take 81 mg by mouth every morning.    Marland Kitchen atorvastatin (LIPITOR) 40 MG tablet Take 1 tablet (40 mg total) by mouth daily. 90 tablet 3  . diclofenac sodium (VOLTAREN) 1 % GEL Apply 2 g topically 4 (four)  times daily as needed. 100 g 0  . hydrochlorothiazide (HYDRODIURIL) 25 MG tablet Take 1 tablet (25 mg total) by mouth daily. 90 tablet 0  . losartan (COZAAR) 100 MG tablet Take 0.5 tablets (50 mg total) by mouth daily. 90 tablet 0  . meloxicam (MOBIC) 7.5 MG tablet Take 1 tablet (7.5 mg total) by mouth daily. 30 tablet 2  . metFORMIN (GLUCOPHAGE) 1000 MG tablet TAKE 1 TABLET TWICE A DAY 180 tablet 1  . naproxen (NAPROSYN) 500 MG tablet Take 1 tablet (500 mg total) by mouth 2 (two) times daily with a meal. 60 tablet 2  . omeprazole (PRILOSEC) 40 MG capsule Take 1 capsule (40 mg total) by mouth daily. 30 capsule 1  . polyethylene glycol (MIRALAX / GLYCOLAX) packet Take 17 g by mouth daily. 14 each 1   No current facility-administered medications on file prior to visit.      OBJECTIVE: BP 135/86   Ht 5\' 7"  (1.702 m)   Wt 180 lb (81.6 kg)   BMI 28.19 kg/m   Physical Exam:  Vital signs are reviewed. Well-developed female no acute distress SHOULDERS: Symmetrical. The left shoulder has no tenderness to palpation over the biceps tendon. She has diffuse pain with forward flexion, abduction and with placing her hand behind her back. Supraspinatus testing reveals some weakness that seems related to pain rather than true muscular weakness. The acromioclavicular joint is nontender to palpation. SCAPULAR: Symmetrical movement  neck slight  HIPS: Left hip has full range of motion internal/external rotation is painless. The right hip is painful when I attempt to internally or externally rotate her hip. She has intact strength hip flexion. She can stand any sign of Trendelenburg. IMAGING: I do not have any recent films. I have in her chart a hip film from June 2017 that reveals preserved joint spaces, very mild osteophyte.  ASSESSMENT & PLAN:  #1. Left shoulder pain #2. Right hip pain PLAN: Offered her repeat injections initially and she said they didn't really help much last time. I reviewed the limited  amount of imaging we have with her. She's pretty adamant that she wants some type of surgical procedure because both her joints are bothering her that much. I told her I did not think there was a specific procedure that I could recommend at this point I would be happy to refer her to orthopedic surgeon for further evaluation. She ultimately decided to pursue that course. I'll be happy see her back in follow-up at any time.

## 2016-03-05 NOTE — Patient Instructions (Signed)
Dr Mardelle Matte Wed 03/10/16 at [place] [facility] [place] [address]  [phone]

## 2016-03-19 ENCOUNTER — Other Ambulatory Visit: Payer: Self-pay | Admitting: Internal Medicine

## 2016-03-19 DIAGNOSIS — Z1231 Encounter for screening mammogram for malignant neoplasm of breast: Secondary | ICD-10-CM

## 2016-03-22 DIAGNOSIS — M542 Cervicalgia: Secondary | ICD-10-CM | POA: Diagnosis not present

## 2016-03-22 DIAGNOSIS — M7542 Impingement syndrome of left shoulder: Secondary | ICD-10-CM | POA: Diagnosis not present

## 2016-03-22 DIAGNOSIS — M5431 Sciatica, right side: Secondary | ICD-10-CM | POA: Diagnosis not present

## 2016-04-10 ENCOUNTER — Ambulatory Visit (HOSPITAL_COMMUNITY)
Admission: EM | Admit: 2016-04-10 | Discharge: 2016-04-10 | Disposition: A | Discharge status: Home / Self Care | Payer: Medicaid Other | Attending: Internal Medicine | Admitting: Internal Medicine

## 2016-04-10 ENCOUNTER — Encounter (HOSPITAL_COMMUNITY): Payer: Self-pay | Admitting: Emergency Medicine

## 2016-04-10 DIAGNOSIS — L03818 Cellulitis of other sites: Secondary | ICD-10-CM

## 2016-04-10 MED ORDER — CEPHALEXIN 500 MG PO CAPS: 500.0000 mg | ORAL_CAPSULE | Freq: Three times a day (TID) | ORAL | 0 refills | Status: AC

## 2016-04-10 NOTE — ED Triage Notes (Signed)
Woke with eye swelling and pain.  No injury.  Patient did help a friend move yesterday, may have got something in eye, but not to her knowledged.  Vision is fine, clear per patient

## 2016-04-10 NOTE — Discharge Instructions (Signed)
Please take the antibiotic as prescribed Return to ER for worsening symptoms Hope you get better!

## 2016-04-10 NOTE — ED Provider Notes (Signed)
CSN: [id number]     Arrival date & time 04/10/16  1335 History   First MD Initiated Contact with Patient 04/10/16 1523     Chief Complaint  Patient presents with  . Eye Pain   (Consider location/radiation/quality/duration/timing/severity/associated sxs/prior Treatment) Patient is deaf, history is provided by a deaf interpreter.   61 y.o. Female, woke up this morning with redness and swelling underneath the right eye with some pain on palpation and is not having a headache. She reports her vision is fine and unaffected. She denies eye pain or eye redness. She denies fever at home. She reports going to bed last night looking fine.       Past Medical History:  Diagnosis Date  . Atypical chest pain    myoview 10/29/10 - Post-stress EF 56%. Low risk and negative for ischemia   . Back pain   . Back pain   . 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 Chronic PID   . Chronic PID (chronic pelvic inflammatory disease)   . Deaf    Since age 66  . Diabetes mellitus   . Dizziness   . Dyslipidemia   . Dyspnea   . History of Doppler ultrasound 06/20/09   Normal, no prior studies for comparison.   . Hypertension   . Lightheadedness   . Mute   . Obese   . URI (upper respiratory infection)   . Ventricular septal defect 2004 per echo   small membranous VSD   Past Surgical History:  Procedure Laterality Date  . CARDIAC CATHETERIZATION  08/23/95  . CHOLECYSTECTOMY  11/04/1993  . COLPOSCOPY    . TUBAL LIGATION  10/05/1978   Family History  Problem Relation Age of Onset  . Cancer Mother     liver  . Heart attack Mother 65  . Hypertension Father   . Diabetes Father   . Heart attack Father   . CAD Father 70  . Diabetes Paternal Aunt   . Diabetes Maternal Grandfather    Social History  Substance Use Topics  . Smoking status: Never Smoker  . Smokeless tobacco: Never Used  . Alcohol use No   OB History    Gravida Para Term Preterm AB  Living   2 2 2     2    SAB TAB Ectopic Multiple Live Births                 Review of Systems  Constitutional:       As stated in the HPI    Allergies  Codeine; Lisinopril; and Penicillins  Home Medications   Prior to Admission medications   Medication Sig Start Date End Date Taking? Authorizing Provider  aspirin 81 MG tablet Take 81 mg by mouth every morning.    Historical Provider, MD  atorvastatin (LIPITOR) 40 MG tablet Take 1 tablet (40 mg total) by mouth daily. 02/09/16   Bethany Molt, DO  cephALEXin (KEFLEX) 500 MG capsule Take 1 capsule (500 mg total) by mouth 3 (three) times daily. 04/10/16 04/17/16  Barry Dienes, NP  diclofenac sodium (VOLTAREN) 1 % GEL Apply 2 g topically 4 (four) times daily as needed. 06/18/15   Francesca Oman, DO  hydrochlorothiazide (HYDRODIURIL) 25 MG tablet Take 1 tablet (25 mg total) by mouth daily. 02/09/16   Bethany Molt, DO  losartan (COZAAR) 100 MG tablet Take 0.5 tablets (50 mg total) by mouth daily. 02/09/16   Bethany Molt, DO  meloxicam (MOBIC) 7.5 MG tablet Take 1 tablet (7.5 mg total) by mouth daily. 02/09/16 02/08/17  Bethany Molt, DO  metFORMIN (GLUCOPHAGE) 1000 MG tablet TAKE 1 TABLET TWICE A DAY 01/06/16   Bethany Molt, DO  naproxen (NAPROSYN) 500 MG tablet Take 1 tablet (500 mg total) by mouth 2 (two) times daily with a meal. 10/14/15 10/13/16  Bethany Molt, DO  omeprazole (PRILOSEC) 40 MG capsule Take 1 capsule (40 mg total) by mouth daily. 02/09/16 02/08/17  Bethany Molt, DO  polyethylene glycol (MIRALAX / GLYCOLAX) packet Take 17 g by mouth daily. 05/01/15   Jones Bales, MD   Meds Ordered and Administered this Visit  Medications - No data to display  BP (!) 160/84 (BP Location: Left Arm)   Pulse 72   Temp 98.7 F (37.1 C) (Oral)   Resp 20   SpO2 97%  No data found.   Physical Exam  Constitutional: She is oriented to person, place, and time. She appears well-developed and well-nourished.  HENT:  See picture below.  Areas of redness is sore  to palpate. No warmth on palpation. Conjunctiva clear; sclera white.   Cardiovascular: Normal rate, regular rhythm and normal heart sounds.   Pulmonary/Chest: Effort normal and breath sounds normal.  Neurological: She is alert and oriented to person, place, and time.  Nursing note and vitals reviewed.   Urgent Care Course     Procedures (including critical care time)  Labs Review Labs Reviewed - No data to display  Imaging Review No results found.  MDM   1. Cellulitis of other specified site    1) Start keflex 500 mg TID x 7 days 2) Apply ice to the area; this may help 3) ER precaution discussed including fever, worsening in redness and swelling    Barry Dienes, NP 04/10/16 1553

## 2016-04-13 ENCOUNTER — Telehealth: Payer: Self-pay

## 2016-04-13 NOTE — Telephone Encounter (Signed)
Natalie from [facility] requesting PA on losartan (COZAAR) 100 MG tablet.

## 2016-04-25 ENCOUNTER — Other Ambulatory Visit: Payer: Self-pay | Admitting: Internal Medicine

## 2016-04-25 DIAGNOSIS — M25512 Pain in left shoulder: Secondary | ICD-10-CM

## 2016-04-28 ENCOUNTER — Ambulatory Visit
Admission: RE | Admit: 2016-04-28 | Discharge: 2016-04-28 | Disposition: A | Discharge status: Home / Self Care | Payer: Medicaid Other | Source: Ambulatory Visit | Attending: Family Medicine | Admitting: Family Medicine

## 2016-04-28 DIAGNOSIS — Z1231 Encounter for screening mammogram for malignant neoplasm of breast: Secondary | ICD-10-CM

## 2016-05-11 ENCOUNTER — Encounter (INDEPENDENT_AMBULATORY_CARE_PROVIDER_SITE_OTHER): Payer: Self-pay

## 2016-05-11 ENCOUNTER — Ambulatory Visit (INDEPENDENT_AMBULATORY_CARE_PROVIDER_SITE_OTHER): Payer: Medicaid Other | Admitting: Internal Medicine

## 2016-05-11 ENCOUNTER — Encounter: Payer: Self-pay | Admitting: Internal Medicine

## 2016-05-11 VITALS — BP 152/83 | HR 99 | Temp 99.0°F | Wt 192.1 lb

## 2016-05-11 DIAGNOSIS — Z79899 Other long term (current) drug therapy: Secondary | ICD-10-CM

## 2016-05-11 DIAGNOSIS — E785 Hyperlipidemia, unspecified: Secondary | ICD-10-CM

## 2016-05-11 DIAGNOSIS — K219 Gastro-esophageal reflux disease without esophagitis: Secondary | ICD-10-CM

## 2016-05-11 DIAGNOSIS — I10 Essential (primary) hypertension: Secondary | ICD-10-CM

## 2016-05-11 DIAGNOSIS — M25519 Pain in unspecified shoulder: Secondary | ICD-10-CM

## 2016-05-11 DIAGNOSIS — R011 Cardiac murmur, unspecified: Secondary | ICD-10-CM | POA: Diagnosis not present

## 2016-05-11 DIAGNOSIS — G8929 Other chronic pain: Secondary | ICD-10-CM | POA: Diagnosis not present

## 2016-05-11 DIAGNOSIS — M7060 Trochanteric bursitis, unspecified hip: Secondary | ICD-10-CM

## 2016-05-11 DIAGNOSIS — H9190 Unspecified hearing loss, unspecified ear: Secondary | ICD-10-CM

## 2016-05-11 DIAGNOSIS — E119 Type 2 diabetes mellitus without complications: Secondary | ICD-10-CM

## 2016-05-11 DIAGNOSIS — E7800 Pure hypercholesterolemia, unspecified: Secondary | ICD-10-CM

## 2016-05-11 DIAGNOSIS — Z7984 Long term (current) use of oral hypoglycemic drugs: Secondary | ICD-10-CM | POA: Diagnosis not present

## 2016-05-11 DIAGNOSIS — E118 Type 2 diabetes mellitus with unspecified complications: Secondary | ICD-10-CM

## 2016-05-11 DIAGNOSIS — M25512 Pain in left shoulder: Secondary | ICD-10-CM

## 2016-05-11 DIAGNOSIS — K5900 Constipation, unspecified: Secondary | ICD-10-CM

## 2016-05-11 DIAGNOSIS — M549 Dorsalgia, unspecified: Secondary | ICD-10-CM | POA: Diagnosis not present

## 2016-05-11 DIAGNOSIS — Z9114 Patient's other noncompliance with medication regimen: Secondary | ICD-10-CM | POA: Diagnosis not present

## 2016-05-11 LAB — GLUCOSE, CAPILLARY: Glucose-Capillary: 196 mg/dL — ABNORMAL HIGH (ref 65–99)

## 2016-05-11 LAB — POCT GLYCOSYLATED HEMOGLOBIN (HGB A1C): HEMOGLOBIN A1C: 7.9

## 2016-05-11 MED ORDER — OMEPRAZOLE 40 MG PO CPDR: 40.0000 mg | DELAYED_RELEASE_CAPSULE | Freq: Every day | ORAL | 2 refills | Status: DC

## 2016-05-11 MED ORDER — MELOXICAM 7.5 MG PO TABS: 7.5000 mg | ORAL_TABLET | Freq: Every day | ORAL | 2 refills | Status: DC

## 2016-05-11 MED ORDER — HYDROCHLOROTHIAZIDE 25 MG PO TABS: 25.0000 mg | ORAL_TABLET | Freq: Every day | ORAL | 2 refills | Status: DC

## 2016-05-11 MED ORDER — ATORVASTATIN CALCIUM 40 MG PO TABS: 40.0000 mg | ORAL_TABLET | Freq: Every day | ORAL | 3 refills | Status: DC

## 2016-05-11 MED ORDER — LOSARTAN POTASSIUM 100 MG PO TABS: 50.0000 mg | ORAL_TABLET | Freq: Every day | ORAL | 2 refills | Status: DC

## 2016-05-11 MED ORDER — DOCUSATE SODIUM 100 MG PO CAPS: 200.0000 mg | ORAL_CAPSULE | Freq: Every day | ORAL | 2 refills | Status: DC | PRN

## 2016-05-11 NOTE — Assessment & Plan Note (Addendum)
Lab Results  Component Value Date   HGBA1C 7.9 05/11/2016   HGBA1C 7.7 02/09/2016   HGBA1C 7.5 10/14/2015    Assessment: Diabetes control:  Uncontrolled Progress toward A1C goal:   Not progressing. Comments: Reports compliance with metformin except for that past few weeks. Reports dietary indiscretion over the past few months.  Plan: Medications:  Metformin 2000 mg daily Foot exam performed today with intact sensation and without ulcerations. Instruction/counseling given: Encouraged strict compliance with metformin. She was also counseled on the benefits of a diet with lower glycemic index foods, frequent exercise and weight loss in controlling DM. If patient remains with elevated Hba1c at FU apt in 3 months, she will require addition of another oral anti-diabetic medication.

## 2016-05-11 NOTE — Assessment & Plan Note (Addendum)
BP Readings from Last 3 Encounters:  05/11/16 (!) 152/83  04/10/16 (!) 160/84  03/05/16 135/86    Lab Results  Component Value Date   NA 140 04/30/2015   K 3.4 (L) 04/30/2015   CREATININE 0.68 04/30/2015    Assessment: Blood pressure control:  Uncontrolled Progress toward BP goal:   Worsening in setting of medication non-compliance and dietary indiscretion.  Plan: Medications: Refills given for HCTZ 25mg  daily and Losartan 50 mg daily. Labs: BMET Self management tools provided:  Encouraged monitoring BP at home with cuff in addition to low salt intake. I also encouraged diet, exercise and weight loss.  Other plans: Patient encouraged to call clinic prior to when her prescriptions run out for refills.

## 2016-05-11 NOTE — Assessment & Plan Note (Signed)
Has been finding good relief with intermittent steroid injections with sports med. Also notices additional relief with meloxicam 7.5 mg daily as needed. Uses this 2-3 times per week per patient.  -Refill meloxicam -Check BMET today -Continue to follow with sports med for prn steroid injections

## 2016-05-11 NOTE — Assessment & Plan Note (Signed)
Compliant with Lipitor 40 mg daily at night. Historically she had anticipated good lab response when first started on this medication.  -Continue statin

## 2016-05-11 NOTE — Progress Notes (Signed)
CC:  Follow-up of diabetes, hypertension, GERD HPI:  [name] is a very pleasant 61 y.o. female with medical history as outlined below who presents for follow up evaluation of her chronic medical conditions.  Type 2 diabetes: On metformin 2000 mg total daily and reports compliance with medicine except for the past 3 weeks she ran out. Hemoglobin A1c 7.7% in February 2018, 7.9% today. She reports some dietary indiscretion over the past 2 months.  Hypertension: Patient reports she's been out of her hydrochlorothiazide 25 mg and losartan 50 mg for about 3 weeks. She denies any blurred vision, headache or focal weakness.  GERD: Reports a several week history of "indigestion", cough that starts at night but is worse in the morning and waking up with a sour taste in her mouth. This started about 1 month ago. She's had these symptoms before however this was controlled with omeprazole however she has been out for several weeks. She denies any weight loss, early satiety, nausea or vomiting.  Chronic back, hip and shoulder pain: See sports medicine for periodic steroid injections with good relief. Between injections she finds good relief with meloxicam 7.5 mg daily as needed.  Past Medical History:  Diagnosis Date  . Atypical chest pain    myoview 10/29/10 - Post-stress EF 56%. Low risk and negative for ischemia   . Back pain   . Back pain   . 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 Chronic PID   . Chronic PID (chronic pelvic inflammatory disease)   . Deaf    Since age 58  . Diabetes mellitus   . Dizziness   . Dyslipidemia   . Dyspnea   . History of Doppler ultrasound 06/20/09   Normal, no prior studies for comparison.   . Hypertension   . Lightheadedness   . Mute   . Obese   . URI (upper respiratory infection)   . Ventricular septal defect 2004 per echo   small membranous VSD    Review of Systems:  Review of Systems    Constitutional: Negative for chills, fever and weight loss.  Eyes: Negative for blurred vision and double vision.  Respiratory: Positive for cough. Negative for hemoptysis, sputum production and shortness of breath.   Cardiovascular: Negative for chest pain and leg swelling.  Gastrointestinal: Positive for constipation and heartburn. Negative for abdominal pain, nausea and vomiting.  Musculoskeletal: Positive for back pain and joint pain. Negative for falls.  Neurological: Negative for dizziness, sensory change, focal weakness, weakness and headaches.   Physical Exam: Physical Exam  Constitutional: She appears well-developed and well-nourished. No distress.  HENT:  Head: Normocephalic and atraumatic.  Deaf, communicates with ASL. Interpreter present.  Cardiovascular: Normal rate, regular rhythm and intact distal pulses.   Murmur heard.  Systolic murmur is present  Pulmonary/Chest: Effort normal and breath sounds normal. No respiratory distress. She has no wheezes.  Abdominal: Soft. She exhibits no distension. There is no tenderness.  Skin: Skin is warm and dry. She is not diaphoretic.  Psychiatric: She has a normal mood and affect. Her behavior is normal.    Vitals:   05/11/16 1323 05/11/16 1352  BP: (!) 172/99 (!) 152/83  Pulse: (!) 105 99  Temp: 99 F (37.2 C)   TempSrc: Oral   SpO2: 98%   Weight: 192 lb 1.6 oz (87.1 kg)     Assessment & Plan:   See Encounters Tab for problem based charting.  Patient discussed with Dr. Angelia Mould

## 2016-05-11 NOTE — Patient Instructions (Addendum)
It was an absolute pleasure seeing you again Ms Philipson! I'm glad you are doing well. We talked about several things today at our visit:   1. We talked about your high blood pressure. It was pretty elevated today during your visit. This is likely because you had ran out of your medicine for several weeks. I will get you refills of these medicines, however please call the office next time you are running low and I will be happy to give you refills.   2. We also talked about your Diabetes. Your Hemoglobin A1c today is 7.9%. Please continue taking your Metformin 1000 mg twice daily. We do need to work on your diet and reduce the amount of sugar and carbohydrates to get better control of your blood sugars.   3. Today we also talked about your cough at night and in the morning. This combined with indigestion sounds like acid reflux, especially because you have been off your acid reflux medicine. I have sent you in a refill for Omeprazole. You will take one a day. It might take several days before you notice a difference.   4. Today we also talked about your shoulder pain and hip pain. I'm so glad those injections and meloxicam are helping. I am giving you a refill of Meloxicam. Please take this as needed and do not take this with similar medicines like Ibuprofen, Naproxen.   5. For the mosquito bite, you can try Topical benadryl or hydrocortisone cream to help with the itching.

## 2016-05-11 NOTE — Assessment & Plan Note (Addendum)
Return of GERD syx in setting of omeprazole non-compliance and NSAID use. No red flag symptoms currently.  -Refill Prilosec 40 mg once daily -Counseled on dietary changes

## 2016-05-12 LAB — BMP8+ANION GAP
ANION GAP: 17 mmol/L (ref 10.0–18.0)
BUN/Creatinine Ratio: 22 (ref 12–28)
BUN: 17 mg/dL (ref 8–27)
CALCIUM: 9.7 mg/dL (ref 8.7–10.3)
CHLORIDE: 95 mmol/L — AB (ref 96–106)
CO2: 27 mmol/L (ref 18–29)
Creatinine, Ser: 0.78 mg/dL (ref 0.57–1.00)
GFR calc Af Amer: 96 mL/min/{1.73_m2} (ref >59)
GFR calc non Af Amer: 83 mL/min/{1.73_m2} (ref >59)
GLUCOSE: 234 mg/dL — AB (ref 65–99)
POTASSIUM: 3.4 mmol/L — AB (ref 3.5–5.2)
SODIUM: 139 mmol/L (ref 134–144)

## 2016-05-18 NOTE — Progress Notes (Signed)
Internal Medicine Clinic Attending  Case discussed with Dr. Molt at the time of the visit.  We reviewed the resident's history and exam and pertinent patient test results.  I agree with the assessment, diagnosis, and plan of care documented in the resident's note. 

## 2016-06-13 ENCOUNTER — Other Ambulatory Visit: Payer: Self-pay | Admitting: Internal Medicine

## 2016-06-23 ENCOUNTER — Other Ambulatory Visit: Payer: Self-pay | Admitting: Internal Medicine

## 2016-07-05 NOTE — Progress Notes (Signed)
   CC: follow-up of type 2 diabetes, HTN and GERD.   HPI:  [name] is a 61 y.o. F with Mhx as outlined below who presents for follow-up of her chronic medical conditions. She does have new request of HIV screen as she's had unprotected intercourse with a partner who had several affairs recently.   Type 2 Diabetes: On metformin 2000 mg daily. Most recent HbA1c 7.9% 05/2016. She reported several week history of significant dietary indiscretion and noncompliance with metformin. She declined addition of an another diabetic agent and instead preferred lifestyle modifications.   HTN: On HCTZ 25mg  and Losartan 50 mg daily. She was hypertensive at her last visit as she had been out of these medications for several weeks.   GERD: Recently re-started on omeprazole due to return of GERD symptoms in setting of PPI non-compliance.  Chronic back, hip and shoulder pain: Sees sports medicine for injections. Doing well with meloxicam  Heath maintenance: UTD on all screens. Would benefit from pneumococcal vaccine.   Systolic murmur/VSD: Follows with cardiology. Has upcoming appointment in September. No chest pain.   Past Medical History:  Diagnosis Date  . Atypical chest pain    myoview 10/29/10 - Post-stress EF 56%. Low risk and negative for ischemia   . Back pain   . Back pain   . 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 Chronic PID   . Chronic PID (chronic pelvic inflammatory disease)   . Deaf    Since age 62  . Diabetes mellitus   . Dizziness   . Dyslipidemia   . Dyspnea   . History of Doppler ultrasound 06/20/09   Normal, no prior studies for comparison.   . Hypertension   . Lightheadedness   . Mute   . Obese   . URI (upper respiratory infection)   . Ventricular septal defect 2004 per echo   small membranous VSD   Review of Systems:   General: Denies fevers, chills, weight loss, fatigue HEENT: Denies changes in vision, sore  throat, dysphagia Cardiac: Denies CP, SOB, palpitations Pulmonary: Denies cough, wheezes, PND Abd: Denies diarrhea, constipation, changes in bowels Extremities: Denies weakness or swelling  Physical Exam: General: Alert, in no acute distress. Pleasant and conversant. Translator present.  HEENT: No icterus, injection or ptosis. Facial muscles intact and symetric Cardiac: RRR. +4/2 systolic murmur best appreciated RUSB Pulmonary: CTA BL with normal WOB on RA. GOod air movement. Abd: Soft, non-tended. +bs Extremities: Warm, perfused. No significant pedal edema.    Vitals:   07/06/16 1425  BP: (!) 154/83  Pulse: 84  Temp: 98.6 F (37 C)  TempSrc: Oral  SpO2: 98%  Weight: 193 lb 12.8 oz (87.9 kg)  Height: 5\' 7"  (1.702 m)   Assessment & Plan:   See Encounters Tab for problem based charting.  Patient discussed with Dr. Angelia Mould

## 2016-07-06 ENCOUNTER — Ambulatory Visit (INDEPENDENT_AMBULATORY_CARE_PROVIDER_SITE_OTHER): Payer: Medicaid Other | Admitting: Internal Medicine

## 2016-07-06 ENCOUNTER — Encounter: Payer: Self-pay | Admitting: Internal Medicine

## 2016-07-06 VITALS — BP 154/83 | HR 84 | Temp 98.6°F | Ht 67.0 in | Wt 193.8 lb

## 2016-07-06 DIAGNOSIS — M549 Dorsalgia, unspecified: Secondary | ICD-10-CM | POA: Diagnosis not present

## 2016-07-06 DIAGNOSIS — M25512 Pain in left shoulder: Secondary | ICD-10-CM

## 2016-07-06 DIAGNOSIS — E119 Type 2 diabetes mellitus without complications: Secondary | ICD-10-CM

## 2016-07-06 DIAGNOSIS — Z79899 Other long term (current) drug therapy: Secondary | ICD-10-CM

## 2016-07-06 DIAGNOSIS — Z114 Encounter for screening for human immunodeficiency virus [HIV]: Secondary | ICD-10-CM

## 2016-07-06 DIAGNOSIS — M25519 Pain in unspecified shoulder: Secondary | ICD-10-CM | POA: Diagnosis not present

## 2016-07-06 DIAGNOSIS — I10 Essential (primary) hypertension: Secondary | ICD-10-CM | POA: Diagnosis not present

## 2016-07-06 DIAGNOSIS — M25559 Pain in unspecified hip: Secondary | ICD-10-CM

## 2016-07-06 DIAGNOSIS — K219 Gastro-esophageal reflux disease without esophagitis: Secondary | ICD-10-CM | POA: Diagnosis not present

## 2016-07-06 DIAGNOSIS — Z7251 High risk heterosexual behavior: Secondary | ICD-10-CM | POA: Diagnosis not present

## 2016-07-06 DIAGNOSIS — G8929 Other chronic pain: Secondary | ICD-10-CM

## 2016-07-06 DIAGNOSIS — Z7984 Long term (current) use of oral hypoglycemic drugs: Secondary | ICD-10-CM | POA: Diagnosis not present

## 2016-07-06 DIAGNOSIS — Z9119 Patient's noncompliance with other medical treatment and regimen: Secondary | ICD-10-CM | POA: Diagnosis not present

## 2016-07-06 MED ORDER — HYDROCHLOROTHIAZIDE 25 MG PO TABS: 25.0000 mg | ORAL_TABLET | Freq: Every day | ORAL | 2 refills | Status: DC

## 2016-07-06 MED ORDER — LOSARTAN POTASSIUM 100 MG PO TABS: 50.0000 mg | ORAL_TABLET | Freq: Every day | ORAL | 2 refills | Status: DC

## 2016-07-06 MED ORDER — MELOXICAM 7.5 MG PO TABS: 7.5000 mg | ORAL_TABLET | Freq: Every day | ORAL | 2 refills | Status: DC

## 2016-07-06 MED ORDER — OMEPRAZOLE 40 MG PO CPDR: 40.0000 mg | DELAYED_RELEASE_CAPSULE | Freq: Two times a day (BID) | ORAL | 2 refills | Status: DC

## 2016-07-06 MED ORDER — ASPIRIN 81 MG PO TABS: 81.0000 mg | ORAL_TABLET | Freq: Every morning | ORAL | 11 refills | Status: DC

## 2016-07-06 NOTE — Patient Instructions (Signed)
It was great seeing you today!  We need to get better control of your blood pressure. I know you are out of one of your medications however I would still like to increase it as you have been perssistently hypertensive for the past several months.   Please take Losartan 100mg  and HCTZ 25mg  once daily. Please monitor your blood pressure at home and come back to clinic in 1 month for BP recheck. At that time we will also check your HbA1c.   I have ordered STD screens as well. I will call you if they are abnormal.

## 2016-07-07 LAB — HIV ANTIBODY (ROUTINE TESTING W REFLEX): HIV Screen 4th Generation wRfx: NONREACTIVE

## 2016-07-09 DIAGNOSIS — Z7251 High risk heterosexual behavior: Secondary | ICD-10-CM | POA: Insufficient documentation

## 2016-07-09 NOTE — Assessment & Plan Note (Signed)
Most recent HbA1c was 7.9% 05/2016. She remains on monotherapy with metformin 2000mg  as she strongly opposed an additional agent at that appointment in May. She instead chose to work on dietary and lifestyle modifications. She will need follow-up HbA1c at her follow-up appointment in 1 month to ensure improvement. Otherwise, consider adding an additional oral agent.  -FU HbA1c at next visit -Consider an additional agent -Continue Metformin 2000mg 

## 2016-07-09 NOTE — Assessment & Plan Note (Signed)
GERD improved but still present. Complains of cough at night with burning feeling in her stomach and chest. She was strongly encouraged to cut back on NSAID use. Will increase protonix to BID for 1 month, and follow up at her next visit. Will likely de-escalate at follow-up if she's had improvement.

## 2016-07-09 NOTE — Progress Notes (Signed)
Internal Medicine Clinic Attending  Case discussed with Dr. Danford Bad at the time of the visit.  We reviewed the resident's history and exam and pertinent patient test results.  I agree with the assessment, diagnosis, and plan of care documented in the resident's note. If BP not controlled at next visit can consider change to Chlorthalidone from HCTZ or combination pill to increase adherence.

## 2016-07-09 NOTE — Assessment & Plan Note (Signed)
Pt requests HIV screen in setting of discovering infidelity in partner with whom she had unprotected intercourse. She denies any fevers, chills, weight loss, flu-like symptoms, sore throat, vaginal pain, dysuria or discharge.  -HIV screen

## 2016-07-09 NOTE — Assessment & Plan Note (Signed)
BP remains uncontrolled. She reports she's been out of her losartan for 3 days. She has remained hypertensive throughout the past several months and even though patient has not taken one of her meds in a few days, I believe it would be best to get more aggressive control of her BP in setting of type 2 diabetes and obesity. Patient strongly encouraged to take a new regimen of: -Losartan 100mg  daily -HCTZ 25 mg daily -She is to FU in 1 month for BP recheck

## 2016-07-22 ENCOUNTER — Emergency Department (HOSPITAL_COMMUNITY): Payer: Self-pay

## 2016-07-22 ENCOUNTER — Emergency Department (HOSPITAL_COMMUNITY)
Admission: EM | Admit: 2016-07-22 | Discharge: 2016-07-23 | Disposition: A | Discharge status: Home / Self Care | Payer: Self-pay | Attending: Emergency Medicine | Admitting: Emergency Medicine

## 2016-07-22 ENCOUNTER — Encounter (HOSPITAL_COMMUNITY): Payer: Self-pay | Admitting: Emergency Medicine

## 2016-07-22 DIAGNOSIS — R51 Headache: Secondary | ICD-10-CM | POA: Insufficient documentation

## 2016-07-22 DIAGNOSIS — R519 Headache, unspecified: Secondary | ICD-10-CM

## 2016-07-22 DIAGNOSIS — R42 Dizziness and giddiness: Secondary | ICD-10-CM | POA: Insufficient documentation

## 2016-07-22 LAB — BASIC METABOLIC PANEL
ANION GAP: 8 (ref 5–15)
BUN: 16 mg/dL (ref 6–20)
CHLORIDE: 109 mmol/L (ref 101–111)
CO2: 25 mmol/L (ref 22–32)
Calcium: 9.2 mg/dL (ref 8.9–10.3)
Creatinine, Ser: 0.69 mg/dL (ref 0.44–1.00)
Glucose, Bld: 97 mg/dL (ref 65–99)
POTASSIUM: 3.3 mmol/L — AB (ref 3.5–5.1)
SODIUM: 142 mmol/L (ref 135–145)

## 2016-07-22 LAB — CBC
HEMATOCRIT: 34.1 % — AB (ref 36.0–46.0)
HEMOGLOBIN: 11.3 g/dL — AB (ref 12.0–15.0)
MCH: 30.2 pg (ref 26.0–34.0)
MCHC: 33.1 g/dL (ref 30.0–36.0)
MCV: 91.2 fL (ref 78.0–100.0)
Platelets: 229 10*3/uL (ref 150–400)
RBC: 3.74 MIL/uL — AB (ref 3.87–5.11)
RDW: 12.8 % (ref 11.5–15.5)
WBC: 6.7 10*3/uL (ref 4.0–10.5)

## 2016-07-22 LAB — URINALYSIS, ROUTINE W REFLEX MICROSCOPIC
BILIRUBIN URINE: NEGATIVE
Glucose, UA: NEGATIVE mg/dL
KETONES UR: NEGATIVE mg/dL
NITRITE: NEGATIVE
Protein, ur: NEGATIVE mg/dL
SPECIFIC GRAVITY, URINE: 1.017 (ref 1.005–1.030)
pH: 5 (ref 5.0–8.0)

## 2016-07-22 LAB — CBG MONITORING, ED: GLUCOSE-CAPILLARY: 87 mg/dL (ref 65–99)

## 2016-07-22 LAB — I-STAT TROPONIN, ED: Troponin i, poc: 0.01 ng/mL (ref 0.00–0.08)

## 2016-07-22 NOTE — ED Triage Notes (Signed)
patient reports dizziness that started yesterday that is worse with movement as well as shooting pains down left arm..  Patient has family member who took BP today and states that it was elevated.

## 2016-07-22 NOTE — ED Provider Notes (Signed)
[facility]-EMERGENCY DEPT Provider Note   CSN: [id number] Arrival date & time: 07/22/16  1705     History   Chief Complaint Chief Complaint  Patient presents with  . Dizziness  . Hypertension    HPI Brandi Mendez is a 61 y.o. female with a hx of SBO presents to the Emergency Department complaining of gradual, persistent, progressively worsening dizziness (described as more lightheadedness), headache and left arm pain 24 hours ago.  Pt reports headache is constant and in the frontal lobes. Associated symptoms include blurry vision in the right eye, lower extremity swelling now resolved.  She denies numbness, tingling, weakness, gait disturbance.  No aggravating or alleviating factors. No treatments PTA.   Family hx of CVA, CAD, diabetes, but no personal hx of those things.  Patient reports she does not regularly see a physician. Pt denies fever, chills, neck pain, neck stiffness, chest pain, nausea, vomiting, abd pain, diarrhea.       The history is provided by the patient and medical records. No language interpreter was used.    Past Medical History:  Diagnosis Date  . Small bowel obstruction (HCC) 2013, 2015    Patient Active Problem List   Diagnosis Date Noted  . Rash 01/03/2015  . Abdominal pain, unspecified site 08/16/2012  . Periauricular adenopathy 07/28/2012  . Dyspepsia 06/01/2012  . BREAST PAIN, LEFT 04/16/2009  . Constipation 01/13/2009  . HYPERTRIGLYCERIDEMIA, MILD 12/26/2006  . OBESITY, UNSPECIFIED 12/26/2006    Past Surgical History:  Procedure Laterality Date  . ABDOMINAL HERNIA REPAIR    . [facility]  . CHOLECYSTECTOMY    . Left ankle surgery      OB History    No data available       Home Medications    Prior to Admission medications   Medication Sig Start Date End Date Taking? Authorizing Provider  acetaminophen (TYLENOL) 500 MG tablet Take 1,000 mg by mouth every 6 (six) hours as needed for moderate pain.   Yes  [provider]  dicyclomine (BENTYL) 20 MG tablet Take 1 tablet (20 mg total) by mouth 2 (two) times daily. As needed for abdominal pain Patient not taking: Reported on 12/06/2015 08/20/15   Ward, Layla MawKristen N, DO    Family History No family history on file.  Social History Social History  Substance Use Topics  . Smoking status: Never Smoker  . Smokeless tobacco: Never Used  . Alcohol use Yes     Comment: sometimes     Allergies   Patient has no known allergies.   Review of Systems Review of Systems  Constitutional: Negative for appetite change, diaphoresis, fatigue, fever and unexpected weight change.  HENT: Negative for mouth sores.   Eyes: Negative for visual disturbance.  Respiratory: Negative for cough, chest tightness, shortness of breath and wheezing.   Cardiovascular: Negative for chest pain.  Gastrointestinal: Negative for abdominal pain, constipation, diarrhea, nausea and vomiting.  Endocrine: Negative for polydipsia, polyphagia and polyuria.  Genitourinary: Negative for dysuria, frequency, hematuria and urgency.  Musculoskeletal: Negative for back pain and neck stiffness.  Skin: Negative for rash.  Allergic/Immunologic: Negative for immunocompromised state.  Neurological: Positive for dizziness. Negative for syncope, light-headedness and headaches.  Hematological: Does not bruise/bleed easily.  Psychiatric/Behavioral: Negative for sleep disturbance. The patient is not nervous/anxious.   All other systems reviewed and are negative.    Physical Exam Updated Vital Signs BP 134/79 (BP Location: Left Arm)   Pulse (!) 46  Temp 98 F (36.7 C) (Oral)   Resp 20   SpO2 100%   Physical Exam  Constitutional: She is oriented to person, place, and time. She appears well-developed and well-nourished. No distress.  HENT:  Head: Normocephalic and atraumatic.  Mouth/Throat: Oropharynx is clear and moist.  Eyes: Pupils are equal, round, and reactive to light.  Conjunctivae and EOM are normal. No scleral icterus.  No horizontal, vertical or rotational nystagmus  Neck: Normal range of motion. Neck supple.  Full active and passive ROM without pain No midline or paraspinal tenderness No nuchal rigidity or meningeal signs  Cardiovascular: Regular rhythm and intact distal pulses.  Bradycardia present.   Pulses:      Radial pulses are 2+ on the right side, and 2+ on the left side.       Dorsalis pedis pulses are 2+ on the right side, and 2+ on the left side.  Pulmonary/Chest: Effort normal and breath sounds normal. No respiratory distress. She has no wheezes. She has no rales.  Abdominal: Soft. Bowel sounds are normal. There is no tenderness. There is no rebound and no guarding.  Musculoskeletal: Normal range of motion.  Lymphadenopathy:    She has no cervical adenopathy.  Neurological: She is alert and oriented to person, place, and time. No cranial nerve deficit. She exhibits normal muscle tone. Coordination normal.  Mental Status:  Alert, oriented, thought content appropriate. Speech fluent without evidence of aphasia. Able to follow 2 step commands without difficulty.  Cranial Nerves:  II:  Peripheral visual fields grossly normal, pupils equal, round, reactive to light III,IV, VI: ptosis not present, extra-ocular motions intact bilaterally  V,VII: smile symmetric, facial light touch sensation equal VIII: hearing grossly normal bilaterally  IX,X: midline uvula rise  XI: bilateral shoulder shrug equal and strong XII: midline tongue extension  Motor:  5/5 in upper and lower extremities bilaterally including strong and equal grip strength and dorsiflexion/plantar flexion Sensory: Pinprick and light touch normal in all extremities.  Cerebellar: normal finger-to-nose with bilateral upper extremities Gait: normal gait and balance CV: distal pulses palpable throughout   Skin: Skin is warm and dry. No rash noted. She is not diaphoretic.  Psychiatric:  She has a normal mood and affect. Her behavior is normal. Judgment and thought content normal.  Nursing note and vitals reviewed.    ED Treatments / Results  Labs (all labs ordered are listed, but only abnormal results are displayed) Labs Reviewed  BASIC METABOLIC PANEL - Abnormal; Notable for the following:       Result Value   Potassium 3.3 (*)    All other components within normal limits  CBC - Abnormal; Notable for the following:    RBC 3.74 (*)    Hemoglobin 11.3 (*)    HCT 34.1 (*)    All other components within normal limits  URINALYSIS, ROUTINE W REFLEX MICROSCOPIC - Abnormal; Notable for the following:    APPearance HAZY (*)    Hgb urine dipstick MODERATE (*)    Leukocytes, UA MODERATE (*)    Bacteria, UA RARE (*)    Squamous Epithelial / LPF 0-5 (*)    All other components within normal limits  CBG MONITORING, ED  I-STAT TROPONIN, ED    EKG  EKG Interpretation  Date/Time:  Thursday July 22 2016 22:19:02 EDT Ventricular Rate:  42 PR Interval:    QRS Duration: 115 QT Interval:  487 QTC Calculation: 407 R Axis:   82 Text Interpretation:  Sinus bradycardia Nonspecific intraventricular  conduction delay No significant change since last tracing Confirmed by Frederick Peers [id number]) on 07/22/2016 11:57:25 PM       Radiology Ct Head Wo Contrast  Result Date: 07/22/2016 CLINICAL DATA:  61 year old female with hypertension and dizziness. EXAM: CT HEAD WITHOUT CONTRAST TECHNIQUE: Contiguous axial images were obtained from the base of the skull through the vertex without intravenous contrast. COMPARISON:  None. FINDINGS: Brain: The ventricles and sulci appropriate size for patient's age. Minimal periventricular and deep white matter chronic microvascular ischemic changes noted. There is no acute intracranial hemorrhage. No mass effect or midline shift noted. No extra-axial fluid collection. Vascular: No hyperdense vessel or unexpected calcification. Skull: Normal. Negative  for fracture or focal lesion. Sinuses/Orbits: No acute finding. Other: None IMPRESSION: No acute intracranial pathology. Electronically Signed   By: Elgie Collard M.D.   On: 07/22/2016 23:27    Procedures Procedures (including critical care time)  Medications Ordered in ED Medications  potassium chloride SA (K-DUR,KLOR-CON) CR tablet 40 mEq (40 mEq Oral Given 07/23/16 0020)  meclizine (ANTIVERT) tablet 25 mg (25 mg Oral Given 07/23/16 0020)     Initial Impression / Assessment and Plan / ED Course  I have reviewed the triage vital signs and the nursing notes.  Pertinent labs & imaging results that were available during my care of the patient were reviewed by me and considered in my medical decision making (see chart for details).  Clinical Course as of Jul 24 139  Fri Jul 23, 2016  0124 The patient was discussed with and seen by Dr. Clarene Duke who agrees with the treatment plan.   [HM]    Clinical Course User Index [HM] Nedim Oki, Boyd Kerbs    He presents with headache, dizziness and arm pain. Negative troponin. EKG with sinus bradycardia however record review shows that patient has had bradycardia in the 40s since 2016 and has previously been evaluated by a cardiologist. She denies chest pain or shortness of breath. Mild hypokalemia, replacement started in the emergency department. A CT scan without acute abnormality. Patient has normal neurologic exam however because her dizziness has been present for greater than 24 hours and persistent, she will need an MRI to rule out CVA.  Unable to obtain MRI at [facility] this time of night. Discussed with Dr. Manus Gunning who will accept at [facility].   Final Clinical Impressions(s) / ED Diagnoses   Final diagnoses:  Dizziness  Acute nonintractable headache, unspecified headache type    New Prescriptions New Prescriptions   No medications on file     Milta Deiters 07/23/16 0141    Little, Ambrose Finland,  MD 07/24/16 [phone]

## 2016-07-23 ENCOUNTER — Emergency Department (HOSPITAL_COMMUNITY): Payer: Self-pay

## 2016-07-23 ENCOUNTER — Encounter (HOSPITAL_COMMUNITY): Payer: Self-pay | Admitting: Emergency Medicine

## 2016-07-23 MED ORDER — POTASSIUM CHLORIDE CRYS ER 20 MEQ PO TBCR
40.0000 meq | EXTENDED_RELEASE_TABLET | Freq: Once | ORAL | Status: AC
  Administered 2016-07-23: 40 meq via ORAL
  Filled 2016-07-23: qty 2

## 2016-07-23 MED ORDER — MECLIZINE HCL 25 MG PO TABS
25.0000 mg | ORAL_TABLET | Freq: Once | ORAL | Status: AC
  Administered 2016-07-23: 25 mg via ORAL
  Filled 2016-07-23: qty 1

## 2016-07-23 MED ORDER — MECLIZINE HCL 25 MG PO TABS: 25.0000 mg | ORAL_TABLET | Freq: Three times a day (TID) | ORAL | 0 refills | Status: DC | PRN

## 2016-07-23 MED ORDER — PROCHLORPERAZINE EDISYLATE 5 MG/ML IJ SOLN
10.0000 mg | Freq: Once | INTRAMUSCULAR | Status: AC
  Administered 2016-07-23: 10 mg via INTRAVENOUS
  Filled 2016-07-23: qty 2

## 2016-07-23 NOTE — ED Notes (Signed)
To MRI at this time.

## 2016-07-23 NOTE — ED Notes (Signed)
Received report from [facility] , [facility]. Notified MRI of pt's presence in dept.

## 2016-07-23 NOTE — ED Notes (Signed)
Charge RN Misty StanleyLisa [facility].  [facility] will transport.

## 2016-07-23 NOTE — Discharge Instructions (Signed)
Meclizine as prescribed as needed for dizziness.  Follow-up with your primary Dr. if symptoms are not improving in the next 3-4 days, and return to the ER if your symptoms significantly worsen or change.

## 2016-07-23 NOTE — ED Provider Notes (Signed)
[facility] for evaluation of dizziness, MRI of brain. She describes the dizziness as an off-balance sensation that I suspect is related to a peripheral vertigo. Her MRI is negative. Laboratory studies are unremarkable and I see no reason for further studies. She will be discharged with meclizine and follow-up as needed for any problems.   Geoffery Lyonselo, Gelila Well, MD 07/23/16 [phone]

## 2016-08-03 ENCOUNTER — Encounter: Payer: Medicaid Other | Admitting: Internal Medicine

## 2016-08-10 ENCOUNTER — Ambulatory Visit (INDEPENDENT_AMBULATORY_CARE_PROVIDER_SITE_OTHER): Payer: Medicaid Other | Admitting: Internal Medicine

## 2016-08-10 ENCOUNTER — Encounter: Payer: Self-pay | Admitting: Internal Medicine

## 2016-08-10 VITALS — BP 146/75 | HR 80 | Temp 98.3°F | Ht 67.0 in | Wt 189.2 lb

## 2016-08-10 DIAGNOSIS — E119 Type 2 diabetes mellitus without complications: Secondary | ICD-10-CM | POA: Diagnosis present

## 2016-08-10 DIAGNOSIS — I10 Essential (primary) hypertension: Secondary | ICD-10-CM

## 2016-08-10 DIAGNOSIS — Z7984 Long term (current) use of oral hypoglycemic drugs: Secondary | ICD-10-CM

## 2016-08-10 DIAGNOSIS — K219 Gastro-esophageal reflux disease without esophagitis: Secondary | ICD-10-CM | POA: Diagnosis not present

## 2016-08-10 DIAGNOSIS — M25512 Pain in left shoulder: Secondary | ICD-10-CM

## 2016-08-10 DIAGNOSIS — Z79899 Other long term (current) drug therapy: Secondary | ICD-10-CM | POA: Diagnosis not present

## 2016-08-10 DIAGNOSIS — H918X3 Other specified hearing loss, bilateral: Secondary | ICD-10-CM | POA: Diagnosis not present

## 2016-08-10 LAB — POCT GLYCOSYLATED HEMOGLOBIN (HGB A1C): Hemoglobin A1C: 8.4

## 2016-08-10 LAB — GLUCOSE, CAPILLARY: GLUCOSE-CAPILLARY: 257 mg/dL — AB (ref 65–99)

## 2016-08-10 MED ORDER — HYDROCHLOROTHIAZIDE 25 MG PO TABS: 25.0000 mg | ORAL_TABLET | Freq: Every day | ORAL | 5 refills | Status: DC

## 2016-08-10 MED ORDER — OMEPRAZOLE 40 MG PO CPDR: 40.0000 mg | DELAYED_RELEASE_CAPSULE | Freq: Two times a day (BID) | ORAL | 2 refills | Status: DC

## 2016-08-10 MED ORDER — LOSARTAN POTASSIUM 100 MG PO TABS: 100.0000 mg | ORAL_TABLET | Freq: Every day | ORAL | 2 refills | Status: DC

## 2016-08-10 MED ORDER — CANAGLIFLOZIN 100 MG PO TABS: 100.0000 mg | ORAL_TABLET | Freq: Every day | ORAL | 3 refills | Status: DC

## 2016-08-10 NOTE — Progress Notes (Signed)
   CC: follow-up of DM2 and HTN.   HPI:  [name] is a 61 y.o. F with medical history as outlined below who presents today for follow up of HTn and DM2. She has no acute complaints.   DM2: HbA1c 8.4% today. On Metformin 2000mg . HbA1c 7.9% 05/2016 at which time she declined additional agent and instead chose a trial of dietary modification. She is open today to an additional agent.   HTN: On HCTZ 25mg  losartan 50mg  and remains hypertensive.   Past Medical History:  Diagnosis Date  . Atypical chest pain    myoview 10/29/10 - Post-stress EF 56%. Low risk and negative for ischemia   . Back pain   . Back pain   . 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 Chronic PID   . Chronic PID (chronic pelvic inflammatory disease)   . Deaf    Since age 17  . Diabetes mellitus   . Dizziness   . Dyslipidemia   . Dyspnea   . History of Doppler ultrasound 06/20/09   Normal, no prior studies for comparison.   . Hypertension   . Lightheadedness   . Mute   . Obese   . URI (upper respiratory infection)   . Ventricular septal defect 2004 per echo   small membranous VSD   Review of Systems:   General: Denies fevers, chills, weight loss, fatigue HEENT: Denies changes in vision Cardiac: Denies CP, SOB, palpitations Pulmonary: Denies cough, wheezes, PND Abd: Denies diarrhea, constipation, changes in bowels Extremities: Denies weakness or swelling  Physical Exam: General: Alert, in no acute distress. Pleasant. Translator present.  HEENT: No icterus, injection or ptosis. Deaf. Cardiac: RRR,. + murmur, unchanged Pulmonary: CTA BL with normal WOB on RA. Able to speak in complete sentences Abd: Soft, non-tended. +bs Extremities: Warm, perfused. No significant pedal edema.    Vitals:   08/10/16 1444  BP: (!) 146/75  Pulse: 80  Temp: 98.3 F (36.8 C)  TempSrc: Oral  SpO2: 99%  Weight: 189 lb 3.2 oz (85.8 kg)  Height: 5\' 7"  (1.702 m)     Assessment & Plan:   See Encounters Tab for problem based charting.  Patient discussed with Dr. Evette Doffing

## 2016-08-10 NOTE — Patient Instructions (Signed)
It was great seeing you today! I'm glad you are doing well.   Today we talked about your diabetes. Your hemoglobin A1c was 8.3%, much higher than your goal of less than 7%. You are doing a good job taking your Metformin however I believe we need an additional medication to get better control. I am prescribing you a medication called Invokana (Canagliflozen). This medicine is well tolerated and has a low-side effect profile. Please take this medication every day. I would like to see you back in 3 months to make sure you are doing okay on this medicine and to recheck your blood sguar.   Today we also talked about your high blood pressure. I am increasing your dose to 100mg  daily and have sent in a new prescription to your pharmacy. Please have the pharmacy call my office if there is any issue.   I have sent in a refill of your Omeprazole as well. Attached is a list of medications which are known to increase acid reflux.    Food Choices for Gastroesophageal Reflux Disease, Adult When you have gastroesophageal reflux disease (GERD), the foods you eat and your eating habits are very important. Choosing the right foods can help ease your discomfort. What guidelines do I need to follow?  Choose fruits, vegetables, whole grains, and low-fat dairy products.  Choose low-fat meat, fish, and poultry.  Limit fats such as oils, salad dressings, butter, nuts, and avocado.  Keep a food diary. This helps you identify foods that cause symptoms.  Avoid foods that cause symptoms. These may be different for everyone.  Eat small meals often instead of 3 large meals a day.  Eat your meals slowly, in a place where you are relaxed.  Limit fried foods.  Cook foods using methods other than frying.  Avoid drinking alcohol.  Avoid drinking large amounts of liquids with your meals.  Avoid bending over or lying down until 2-3 hours after eating. What foods are not recommended? These are some foods and drinks  that may make your symptoms worse: Vegetables Tomatoes. Tomato juice. Tomato and spaghetti sauce. Chili peppers. Onion and garlic. Horseradish. Fruits Oranges, grapefruit, and lemon (fruit and juice). Meats High-fat meats, fish, and poultry. This includes hot dogs, ribs, ham, sausage, salami, and bacon. Dairy Whole milk and chocolate milk. Sour cream. Cream. Butter. Ice cream. Cream cheese. Drinks Coffee and tea. Bubbly (carbonated) drinks or energy drinks. Condiments Hot sauce. Barbecue sauce. Sweets/Desserts Chocolate and cocoa. Donuts. Peppermint and spearmint. Fats and Oils High-fat foods. This includes [place] fries and potato chips. Other Vinegar. Strong spices. This includes black pepper, white pepper, red pepper, cayenne, curry powder, cloves, ginger, and chili powder. The items listed above may not be a complete list of foods and drinks to avoid. Contact your dietitian for more information. This information is not intended to replace advice given to you by your health care provider. Make sure you discuss any questions you have with your health care provider. Document Released: 06/22/2011 Document Revised: 05/29/2015 Document Reviewed: 10/25/2012 Elsevier Interactive Patient Education  2017 [employer].

## 2016-08-11 ENCOUNTER — Telehealth: Payer: Self-pay | Admitting: *Deleted

## 2016-08-11 NOTE — Telephone Encounter (Signed)
Fax from [facility] not a preferred medication on patient's insurance plan.  Message sent to Dr. Danford Bad to consider change to covered medication Jardiance.  Sander Nephew, RN 08/11/2016 9:29 AM

## 2016-08-12 NOTE — Assessment & Plan Note (Addendum)
HbA1c 8.4%. She was surprised to hear about her worsening glycemic control and is open to starting an additional agent today. Will start SGLT2 inhibitor today. Discussed with patient slight chance of urinary infection vs yeast infection and to call if she would experience any syx.  -Start Invokana today.  -FU HbA1c 3 mo

## 2016-08-12 NOTE — Assessment & Plan Note (Signed)
Still has some burning epigastric discomfort and sour taste in mouth in AM. Would like refill of PPI -Omeprazole refill sent -Educated on foods and lifestyle mods to decr syx

## 2016-08-12 NOTE — Assessment & Plan Note (Addendum)
Hypertensive today. Losartan was to be increased to 100mg  at last visit however was not done. Will increase to 100mg  today. Pt to take BP at home and to call if any syx.   -Increase losartan to 100mg  daily

## 2016-08-12 NOTE — Progress Notes (Signed)
Internal Medicine Clinic Attending  Case discussed with Dr. Molt at the time of the visit.  We reviewed the resident's history and exam and pertinent patient test results.  I agree with the assessment, diagnosis, and plan of care documented in the resident's note. 

## 2016-08-17 LAB — HM DIABETES EYE EXAM

## 2016-08-24 ENCOUNTER — Other Ambulatory Visit: Payer: Self-pay | Admitting: Internal Medicine

## 2016-08-24 DIAGNOSIS — M25512 Pain in left shoulder: Secondary | ICD-10-CM

## 2016-09-18 ENCOUNTER — Encounter (HOSPITAL_COMMUNITY): Payer: Self-pay | Admitting: Physician Assistant

## 2016-09-18 ENCOUNTER — Ambulatory Visit (HOSPITAL_COMMUNITY)
Admission: EM | Admit: 2016-09-18 | Discharge: 2016-09-18 | Disposition: A | Discharge status: Home / Self Care | Payer: Medicaid Other | Attending: Internal Medicine | Admitting: Internal Medicine

## 2016-09-18 DIAGNOSIS — Z91030 Bee allergy status: Secondary | ICD-10-CM | POA: Diagnosis not present

## 2016-09-18 DIAGNOSIS — S1096XA Insect bite of unspecified part of neck, initial encounter: Secondary | ICD-10-CM | POA: Diagnosis not present

## 2016-09-18 DIAGNOSIS — T63441A Toxic effect of venom of bees, accidental (unintentional), initial encounter: Secondary | ICD-10-CM

## 2016-09-18 MED ORDER — CETIRIZINE HCL 10 MG PO TABS: 10.0000 mg | ORAL_TABLET | Freq: Every day | ORAL | 0 refills | Status: DC

## 2016-09-18 MED ORDER — METHYLPREDNISOLONE ACETATE 80 MG/ML IJ SUSP
80.0000 mg | Freq: Once | INTRAMUSCULAR | Status: AC
  Administered 2016-09-18: 80 mg via INTRAMUSCULAR

## 2016-09-18 MED ORDER — METHYLPREDNISOLONE SODIUM SUCC 125 MG IJ SOLR
INTRAMUSCULAR | Status: AC
  Filled 2016-09-18: qty 2

## 2016-09-18 MED ORDER — DIPHENHYDRAMINE HCL 50 MG/ML IJ SOLN
25.0000 mg | Freq: Once | INTRAMUSCULAR | Status: AC
  Administered 2016-09-18: 25 mg via INTRAMUSCULAR

## 2016-09-18 MED ORDER — METHYLPREDNISOLONE ACETATE 80 MG/ML IJ SUSP
INTRAMUSCULAR | Status: AC
  Filled 2016-09-18: qty 1

## 2016-09-18 MED ORDER — DIPHENHYDRAMINE HCL 50 MG/ML IJ SOLN
INTRAMUSCULAR | Status: AC
  Filled 2016-09-18: qty 1

## 2016-09-18 MED ORDER — METHYLPREDNISOLONE SODIUM SUCC 125 MG IJ SOLR: 125.0000 mg | Freq: Once | INTRAMUSCULAR | Status: DC

## 2016-09-18 NOTE — ED Triage Notes (Signed)
Pt is deaf... Via ASL interpreter [phone] Lelan Pons)  Pt reports bee sting yest and this morning when she woke up to the back of neck  Sx also include: HA, SOB, dysphagia, nausea, abd pain  A&O x4... NAD... Ambulatory

## 2016-09-18 NOTE — ED Provider Notes (Signed)
[facility]    CSN: [id number] Arrival date & time: 09/18/16  1201     History   Chief Complaint Chief Complaint  Patient presents with  . Insect Bite    HPI Janice Massey is a 61 y.o. female.   61 year old female with history of diabetes, hypertension, comes in for 2 day history of swelling of the neck after bee sting. History was given by patient through sign language interpreter. Patient states the first bee sting was last night, and the second was earlier this morning. She states bee was in her room, and they have not found out how bee entered the room. States both incidents, she was stung at the back of her neck. She is experiencing shortness of breath, trouble swallowing, nausea, headache. She has not taken anything for the pain, or the swelling. Denies drooling, wheezing, chest pain, weakness, dizziness. Patient diabetes uncontrolled with last a1c 8.4      Past Medical History:  Diagnosis Date  . Atypical chest pain    myoview 10/29/10 - Post-stress EF 56%. Low risk and negative for ischemia   . Back pain   . Back pain   . 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 Chronic PID   . Chronic PID (chronic pelvic inflammatory disease)   . Deaf    Since age 28  . Diabetes mellitus   . Dizziness   . Dyslipidemia   . Dyspnea   . History of Doppler ultrasound 06/20/09   Normal, no prior studies for comparison.   . Hypertension   . Lightheadedness   . Mute   . Obese   . URI (upper respiratory infection)   . Ventricular septal defect 2004 per echo   small membranous VSD    Patient Active Problem List   Diagnosis Date Noted  . Unprotected sexual intercourse 07/09/2016  . Trochanteric bursitis 04/02/2015  . Postmenopausal vaginal bleeding 10/01/2013  . VSD (ventricular septal defect), perimembranous 05/29/2013  . Health care maintenance 11/09/2011  . Essential hypertension 08/10/2011  . Hyperlipidemia  10/11/2007  . BACK PAIN 03/08/2006  . Hearing loss 11/10/2005  . Asthma 11/10/2005  . GERD 11/10/2005  . SYSTOLIC MURMUR 81/19/1478  . INJURY, CERVICAL ROOT 11/10/2005  . Diabetes ([place]) 04/05/2002    Past Surgical History:  Procedure Laterality Date  . CARDIAC CATHETERIZATION  08/23/95  . CHOLECYSTECTOMY  11/04/1993  . COLPOSCOPY    . TUBAL LIGATION  10/05/1978    OB History    Gravida Para Term Preterm AB Living   2 2 2     2    SAB TAB Ectopic Multiple Live Births                   Home Medications    Prior to Admission medications   Medication Sig Start Date End Date Taking? Authorizing Provider  aspirin 81 MG tablet Take 1 tablet (81 mg total) by mouth every morning. 07/06/16  Yes Molt, Bethany, DO  atorvastatin (LIPITOR) 40 MG tablet Take 1 tablet (40 mg total) by mouth daily. 05/11/16  Yes Molt, Bethany, DO  canagliflozin (INVOKANA) 100 MG TABS tablet Take 1 tablet (100 mg total) by mouth daily before breakfast. 08/10/16  Yes Molt, Bethany, DO  hydrochlorothiazide (HYDRODIURIL) 25 MG tablet Take 1 tablet (25 mg total) by mouth daily. 08/10/16  Yes Molt, Bethany, DO  losartan (COZAAR) 100 MG tablet Take 1 tablet (100 mg total) by mouth  daily. 08/10/16  Yes Molt, Bethany, DO  meloxicam (MOBIC) 7.5 MG tablet Take 1 tablet (7.5 mg total) by mouth daily. 07/06/16 07/06/17 Yes Molt, Bethany, DO  metFORMIN (GLUCOPHAGE) 1000 MG tablet TAKE 1 TABLET TWICE A DAY 06/14/16  Yes Molt, Bethany, DO  omeprazole (PRILOSEC) 40 MG capsule Take 1 capsule (40 mg total) by mouth 2 (two) times daily. 08/10/16  Yes Molt, Bethany, DO  omeprazole (PRILOSEC) 40 MG capsule TAKE 1 CAPSULE(40 MG) BY MOUTH TWICE DAILY 08/25/16  Yes Molt, Bethany, DO  cetirizine (ZYRTEC) 10 MG tablet Take 1 tablet (10 mg total) by mouth daily. 09/18/16   Tasia Catchings, Rosette Bellavance V, PA-C  diclofenac sodium (VOLTAREN) 1 % GEL Apply 2 g topically 4 (four) times daily as needed. 06/18/15   Francesca Oman, DO  docusate sodium (COLACE) 100 MG capsule Take 2  capsules (200 mg total) by mouth daily as needed for mild constipation or moderate constipation. 05/11/16   Molt, Bethany, DO  naproxen (NAPROSYN) 500 MG tablet Take 1 tablet (500 mg total) by mouth 2 (two) times daily with a meal. 10/14/15 10/13/16  Molt, Bethany, DO  polyethylene glycol (MIRALAX / GLYCOLAX) packet Take 17 g by mouth daily. 05/01/15   Jones Bales, MD    Family History Family History  Problem Relation Age of Onset  . Cancer Mother        liver  . Heart attack Mother 69  . Hypertension Father   . Diabetes Father   . Heart attack Father   . CAD Father 65  . Diabetes Paternal Aunt   . Diabetes Maternal Grandfather     Social History Social History  Substance Use Topics  . Smoking status: Never Smoker  . Smokeless tobacco: Never Used  . Alcohol use No     Allergies   Codeine; Lisinopril; and Penicillins   Review of Systems Review of Systems  Reason unable to perform ROS: See HPI as above.     Physical Exam Triage Vital Signs ED Triage Vitals [09/18/16 1224]  Enc Vitals Group     BP (!) 154/90     Pulse Rate 74     Resp 20     Temp 98.6 F (37 C)     Temp Source Oral     SpO2 98 %     Weight      Height      Head Circumference      Peak Flow      Pain Score 8     Pain Loc      Pain Edu?      Excl. in [place]?    No data found.   Updated Vital Signs BP (!) 154/90 (BP Location: Left Arm)   Pulse 74   Temp 98.6 F (37 C) (Oral)   Resp 20   SpO2 98%   Physical Exam  Constitutional: She is oriented to person, place, and time. She appears well-developed and well-nourished. No distress.  HENT:  Head: Normocephalic and atraumatic.  Mouth/Throat: Uvula is midline, oropharynx is clear and moist and mucous membranes are normal. No posterior oropharyngeal edema or posterior oropharyngeal erythema.  Eyes: Pupils are equal, round, and reactive to light. Conjunctivae are normal.  Neck: Normal range of motion. Neck supple.  Swelling on posterior  neck with erythema from bee sting. No increased warmth. Tenderness to palpation.   Cardiovascular: Normal rate and regular rhythm.  Exam reveals no gallop and no friction rub.   Murmur heard. Pulmonary/Chest: Effort  normal and breath sounds normal. No respiratory distress. She has no wheezes. She has no rales.  Lymphadenopathy:    She has no cervical adenopathy.  Neurological: She is alert and oriented to person, place, and time.     UC Treatments / Results  Labs (all labs ordered are listed, but only abnormal results are displayed) Labs Reviewed - No data to display  EKG  EKG Interpretation None       Radiology No results found.  Procedures Procedures (including critical care time)  Medications Ordered in UC Medications  diphenhydrAMINE (BENADRYL) injection 25 mg (25 mg Intramuscular Given 09/18/16 1323)  methylPREDNISolone acetate (DEPO-MEDROL) injection 80 mg (80 mg Intramuscular Given 09/18/16 1326)     Initial Impression / Assessment and Plan / UC Course  I have reviewed the triage vital signs and the nursing notes.  Pertinent labs & imaging results that were available during my care of the patient were reviewed by me and considered in my medical decision making (see chart for details).    Patient with bee sting causing sensation of swelling of the throat and shortness of breath. On exam, oropharynx was clear without edema, patient with full ROM of neck and was able to provide HPI without any distress. Lungs were clear to auscultation bilaterally without wheezing, or signs of respiratory distress. Benadryl and depomedrol injection in office. Patient to take zyrtec as directed. Ice compress on swollen area.  Discussed possible hyperglycemia with depomedrol injection, patient to inform PCP and follow up for management. Return precautions given.   Final Clinical Impressions(s) / UC Diagnoses   Final diagnoses:  Bee sting, accidental or unintentional, initial encounter     New Prescriptions Discharge Medication List as of 09/18/2016 12:54 PM    START taking these medications   Details  cetirizine (ZYRTEC) 10 MG tablet Take 1 tablet (10 mg total) by mouth daily., Starting Sat 09/18/2016, Normal          Ok Edwards, PA-C 09/18/16 1708

## 2016-09-18 NOTE — Discharge Instructions (Signed)
We gave you a steroid and benadryl injection in office. The steroid injection is called depomedrol, it can increased you blood sugar short term, please let your PCP know, and to monitor your blood sugars. Take zyrtec as directed for itchiness. Ice compress on swollen area. You can take ibuprofen for the pain. Monitor for worsening of symptoms, trouble breathing, trouble swallowing, swelling of the throat, go to the emergency department for further evaluation.

## 2016-10-01 ENCOUNTER — Other Ambulatory Visit (HOSPITAL_COMMUNITY): Payer: Medicaid Other

## 2016-10-01 ENCOUNTER — Encounter: Payer: Self-pay | Admitting: Cardiovascular Disease

## 2016-10-06 ENCOUNTER — Telehealth: Payer: Self-pay

## 2016-10-06 NOTE — Telephone Encounter (Signed)
Left message for patient to call back to schedule an echo before her office visit, next Wednesday with Dr. Johnsie Cancel.

## 2016-10-11 ENCOUNTER — Ambulatory Visit (HOSPITAL_COMMUNITY): Payer: Medicaid Other | Attending: Internal Medicine

## 2016-10-11 ENCOUNTER — Other Ambulatory Visit: Payer: Self-pay

## 2016-10-11 DIAGNOSIS — E119 Type 2 diabetes mellitus without complications: Secondary | ICD-10-CM | POA: Insufficient documentation

## 2016-10-11 DIAGNOSIS — I10 Essential (primary) hypertension: Secondary | ICD-10-CM | POA: Insufficient documentation

## 2016-10-11 DIAGNOSIS — E785 Hyperlipidemia, unspecified: Secondary | ICD-10-CM | POA: Insufficient documentation

## 2016-10-11 DIAGNOSIS — Q210 Ventricular septal defect: Secondary | ICD-10-CM | POA: Diagnosis present

## 2016-10-11 DIAGNOSIS — E669 Obesity, unspecified: Secondary | ICD-10-CM | POA: Insufficient documentation

## 2016-10-13 ENCOUNTER — Ambulatory Visit: Payer: Medicaid Other | Admitting: Cardiovascular Disease

## 2016-10-15 NOTE — Progress Notes (Signed)
10/19/2016 Janice Massey   04-08-1955  [id number]  Primary Physician Molt, Romelle Starcher, DO Primary Cardiologist: Dr. Johnsie Cancel   Reason for Visit/CC:  Chest Pain  HPI:  The patient is a 61 year old deaf female previously followed by Dr. Rollene Fare. She presents to clinic today with the assistance of a translator. Her past cardiac history is significant for VSD. Her most recent 2-D echocardiogram 10/11/16   revealed a small, restrictive perimembranous VSD. She was noted to have normal LV size and systolic function at that time with an estimated ejection fraction of 55-60%. Normal RV size and systolic function. She has no documented history of coronary disease. She underwent a Myoview stress test in December 2016  for chest pain which was a normal study without ischemia. EF was 62%. Additional past medical history is also significant for diabetes, hypertension and hyperlipidemia. She is on appropriate medical therapy and reports full daily compliance.   She is a twin.  Granddaughter living with her Has other family in town Sign language interpreter with her today     Current Outpatient Prescriptions  Medication Sig Dispense Refill  . aspirin 81 MG tablet Take 1 tablet (81 mg total) by mouth every morning. 30 tablet 11  . atorvastatin (LIPITOR) 40 MG tablet Take 1 tablet (40 mg total) by mouth daily. 90 tablet 3  . canagliflozin (INVOKANA) 100 MG TABS tablet Take 1 tablet (100 mg total) by mouth daily before breakfast. 30 tablet 3  . cetirizine (ZYRTEC) 10 MG tablet Take 1 tablet (10 mg total) by mouth daily. 10 tablet 0  . diclofenac sodium (VOLTAREN) 1 % GEL Apply 2 g topically 4 (four) times daily as needed. 100 g 0  . docusate sodium (COLACE) 100 MG capsule Take 2 capsules (200 mg total) by mouth daily as needed for mild constipation or moderate constipation. 30 capsule 2  . hydrochlorothiazide (HYDRODIURIL) 25 MG tablet Take 1 tablet (25 mg total) by mouth daily. 90 tablet 5  . losartan (COZAAR)  100 MG tablet Take 1 tablet (100 mg total) by mouth daily. 30 tablet 2  . meloxicam (MOBIC) 7.5 MG tablet Take 1 tablet (7.5 mg total) by mouth daily. 30 tablet 2  . metFORMIN (GLUCOPHAGE) 1000 MG tablet TAKE 1 TABLET TWICE A DAY 180 tablet 1  . omeprazole (PRILOSEC) 40 MG capsule TAKE 1 CAPSULE(40 MG) BY MOUTH TWICE DAILY 180 capsule 2  . polyethylene glycol (MIRALAX / GLYCOLAX) packet Take 17 g by mouth daily. 14 each 1   No current facility-administered medications for this visit.     Allergies  Allergen Reactions  . Codeine Itching  . Lisinopril Cough  . Penicillins Itching    Has patient had a PCN reaction causing immediate rash, facial/tongue/throat swelling, SOB or lightheadedness with hypotension: YES Has patient had a PCN reaction causing severe rash involving mucus membranes or skin necrosis: NO Has patient had a PCN reaction that required hospitalization NO Has patient had a PCN reaction occurring within the last 10 years: NO If all of the above answers are "NO", then may proceed with Cephalosporin use.    Social History   Social History  . Marital status: Single    Spouse name: N/A  . Number of children: 1  . Years of education: N/A   Occupational History  .  Unemployed   Social History Main Topics  . Smoking status: Never Smoker  . Smokeless tobacco: Never Used  . Alcohol use No  . Drug use: No  .  Sexual activity: No   Other Topics Concern  . Not on file   Social History Narrative  . No narrative on file     Review of Systems: General: negative for chills, fever, night sweats or weight changes.  Cardiovascular: negative for chest pain, dyspnea on exertion, edema, orthopnea, palpitations, paroxysmal nocturnal dyspnea or shortness of breath Dermatological: negative for rash Respiratory: negative for cough or wheezing Urologic: negative for hematuria Abdominal: negative for nausea, vomiting, diarrhea, bright red blood per rectum, melena, or  hematemesis Neurologic: negative for visual changes, syncope, or dizziness All other systems reviewed and are otherwise negative except as noted above.    Blood pressure (!) 146/100, pulse 84, height 5\' 7"  (1.702 m), weight 182 lb 9.6 oz (82.8 kg), SpO2 96 %.   Affect appropriate Healthy:  appears stated age 58: normal Neck supple with no adenopathy JVP normal no bruits no thyromegaly Lungs clear with no wheezing and good diaphragmatic motion Heart:  S1/S2 loud VSD murmur, no rub, gallop or click PMI normal Abdomen: benighn, BS positve, no tenderness, no AAA no bruit.  No HSM or HJR Distal pulses intact with no bruits No edema Neuro non-focal Skin warm and dry No muscular weakness    EKG NSR. Non significant inferolateral Q waves  101618 SR rate 84 inferior lateral Q;s not pathologic  ASSESSMENT AND PLAN:   1. Chest Pain:  Resolved normal myovue 2015 and 2016    2. HTN: followed by PCP. Controlled on HCTZ and Cozaar   3. HLD: on statin.  4. DM: followed by PCP. On Metformin. Recent Hgb A1c controlled at 6.2.   5. VSD:  Restrictive with no signs of pulmonary hypertension f/u echo 10/11/16  reviewed And stable with good RV/LV function and no pulmonary hypertension   Jenkins Rouge

## 2016-10-19 ENCOUNTER — Encounter: Payer: Self-pay | Admitting: Cardiovascular Disease

## 2016-10-19 ENCOUNTER — Ambulatory Visit (INDEPENDENT_AMBULATORY_CARE_PROVIDER_SITE_OTHER): Payer: Medicaid Other | Admitting: Cardiovascular Disease

## 2016-10-19 VITALS — BP 146/100 | HR 84 | Ht 67.0 in | Wt 182.6 lb

## 2016-10-19 DIAGNOSIS — I10 Essential (primary) hypertension: Secondary | ICD-10-CM

## 2016-10-19 DIAGNOSIS — E785 Hyperlipidemia, unspecified: Secondary | ICD-10-CM | POA: Diagnosis not present

## 2016-10-19 DIAGNOSIS — Z23 Encounter for immunization: Secondary | ICD-10-CM | POA: Diagnosis not present

## 2016-10-19 DIAGNOSIS — Q210 Ventricular septal defect: Secondary | ICD-10-CM

## 2016-10-19 NOTE — Patient Instructions (Signed)

## 2016-10-20 ENCOUNTER — Other Ambulatory Visit: Payer: Self-pay

## 2016-10-20 MED ORDER — CETIRIZINE HCL 10 MG PO TABS: 10.0000 mg | ORAL_TABLET | Freq: Every day | ORAL | 0 refills | Status: DC

## 2016-10-20 NOTE — Telephone Encounter (Signed)
cetirizine (ZYRTEC) 10 MG tablet, refill request @ [facility].

## 2016-10-21 NOTE — Telephone Encounter (Signed)
Patient had echo before office visit on 10/16.

## 2016-11-16 ENCOUNTER — Other Ambulatory Visit: Payer: Self-pay

## 2016-11-16 ENCOUNTER — Ambulatory Visit: Payer: Medicaid Other | Admitting: Internal Medicine

## 2016-11-16 ENCOUNTER — Encounter: Payer: Self-pay | Admitting: Internal Medicine

## 2016-11-16 VITALS — BP 150/83 | HR 93 | Temp 98.3°F | Ht 67.0 in | Wt 188.0 lb

## 2016-11-16 DIAGNOSIS — Z79899 Other long term (current) drug therapy: Secondary | ICD-10-CM | POA: Diagnosis not present

## 2016-11-16 DIAGNOSIS — I10 Essential (primary) hypertension: Secondary | ICD-10-CM

## 2016-11-16 DIAGNOSIS — J309 Allergic rhinitis, unspecified: Secondary | ICD-10-CM

## 2016-11-16 DIAGNOSIS — E119 Type 2 diabetes mellitus without complications: Secondary | ICD-10-CM

## 2016-11-16 DIAGNOSIS — Z7984 Long term (current) use of oral hypoglycemic drugs: Secondary | ICD-10-CM

## 2016-11-16 DIAGNOSIS — E785 Hyperlipidemia, unspecified: Secondary | ICD-10-CM | POA: Diagnosis not present

## 2016-11-16 DIAGNOSIS — E7800 Pure hypercholesterolemia, unspecified: Secondary | ICD-10-CM

## 2016-11-16 LAB — GLUCOSE, CAPILLARY: Glucose-Capillary: 151 mg/dL — ABNORMAL HIGH (ref 65–99)

## 2016-11-16 LAB — POCT GLYCOSYLATED HEMOGLOBIN (HGB A1C): Hemoglobin A1C: 7.7

## 2016-11-16 MED ORDER — ATORVASTATIN CALCIUM 40 MG PO TABS: 40.0000 mg | ORAL_TABLET | Freq: Every day | ORAL | 3 refills | Status: DC

## 2016-11-16 MED ORDER — CETIRIZINE HCL 10 MG PO TABS: 10.0000 mg | ORAL_TABLET | Freq: Every day | ORAL | 3 refills | Status: DC

## 2016-11-16 MED ORDER — LOSARTAN POTASSIUM 100 MG PO TABS: 100.0000 mg | ORAL_TABLET | Freq: Every day | ORAL | 2 refills | Status: DC

## 2016-11-16 MED ORDER — METFORMIN HCL 1000 MG PO TABS: 1000.0000 mg | ORAL_TABLET | Freq: Two times a day (BID) | ORAL | 2 refills | Status: DC

## 2016-11-16 NOTE — Assessment & Plan Note (Signed)
No complains with Atorvastatin 40 mg daily. Refill sent to pharmacy.

## 2016-11-16 NOTE — Progress Notes (Signed)
   CC: follow-up of DM2 and HTN  HPI:  [name] is a 61 y.o. F here for follow-up of DM2 and HTN. Of note she is excited to add several great grandchildren to her family over the next few weeks. 1 girl born in October.  DM2: On Metformin 2000mg  daily and invokana 100mg  daily. Invokana started 08/2016 when HbA1c 8.4% after metformin + dietary modifications failed to control her blood sugar for several months. Feels much better since starting invokana and denies any dysuria.  Checks her feet at home regularly and denies any ulcerations.  HTN: On HCTZ 25mg , and losartan 50 mg daily. Unfortunately 100mg  daily has been prescribed repeatedly however for whatever reason she is only given a prescription for Losartan 100 mg, take 1/2 pill daily, even after calling the pharmacy.   HLD: on Atorvastatin 40mg .   HCM: UTD except for pneumococcal shot  Past Medical History:  Diagnosis Date  . Atypical chest pain    myoview 10/29/10 - Post-stress EF 56%. Low risk and negative for ischemia   . Back pain   . Back pain   . 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 Chronic PID   . Chronic PID (chronic pelvic inflammatory disease)   . Deaf    Since age 45  . Diabetes mellitus   . Dizziness   . Dyslipidemia   . Dyspnea   . History of Doppler ultrasound 06/20/09   Normal, no prior studies for comparison.   . Hypertension   . Lightheadedness   . Mute   . Obese   . URI (upper respiratory infection)   . Ventricular septal defect 2004 per echo   small membranous VSD   Review of Systems:   General: Denies fevers, chills, weight loss, fatigue HEENT: Denies changes in vision, dysphagia Cardiac: Denies CP, SOB, palpitations Pulmonary: Denies cough, wheezes, PND Abd: Denies diarrhea, constipation, changes in bowels Extremities: Denies weakness or swelling  Physical Exam: General: Alert, in no acute distress. Pleasant and conversant. Sign  interpretors present. HEENT: No icterus, injection or ptosis.  Cardiac: RRR, no MGR appreciated Pulmonary: CTA BL with normal WOB on RA.  Abd: Soft, non-tended. +bs Extremities: Warm, perfused. No significant pedal edema. Feet without ulcerations.  Vitals:   11/16/16 1400  BP: (!) 150/83  Pulse: 93  Temp: 98.3 F (36.8 C)  TempSrc: Oral  SpO2: 98%  Weight: 188 lb (85.3 kg)  Height: 5\' 7"  (1.702 m)   Assessment & Plan:   See Encounters Tab for problem based charting.  Patient discussed with Dr. Daryll Drown

## 2016-11-16 NOTE — Assessment & Plan Note (Signed)
BP remains elevated today at 150/83. At last visit I personally called her pharmacy to update them on the Losartan 100mg  1 pill once daily however she shows me a bottle today with 100mg  take 1/2 pill daily. I again have called the pharmacy to clarify as well as sent in a new prescription.  -Take Losartan 100mg  one pill PO once daily. Patient to call if any issues -Take HCTZ 25mg  once daily as well -Follow-up BP 1 mo

## 2016-11-16 NOTE — Assessment & Plan Note (Signed)
Lab Results  Component Value Date   HGBA1C 7.7 11/16/2016   HGBA1C 8.4 08/10/2016   HGBA1C 7.9 05/11/2016     Assessment: Diabetes control:  Near goal Progress toward A1C goal:   Improving Comments: Patient is tolerating the addition of Invokana 100mg  daily well and feels improved. Still tolerating Metformin 2000 mg.   Plan: Medications:  Continue Metformin 2000mg  and Invokana 100mg  daily.  Instruction/counseling given: Discussed appropriate diet for diabetes. She will be getting a gym membership near her house and is looking forward to using the stationary bike.  Follow-up 3 months for repeat A1c.

## 2016-11-16 NOTE — Patient Instructions (Addendum)
It was great seeing you today! I'm glad you are doing well!  Today we talked about your diabetes. Your A1c was 7.7%! You are near your goal! Please continue taking your Metformin 2000mg  daily and your Invokana 100mg  daily. Please continue eating a diabetic-friendly diet which is low on simple carbohydrates like pasta, flour, sugar, etc.   Today we talked about your blood pressure. Please continue taking HCTZ 25mg . I have again called [facility] about your Losartan and both sent in and called to make sure they received the prescription for 100mg . Please take 100mg  daily.   See me back in 3 months, or sooner if needed!

## 2016-11-17 NOTE — Progress Notes (Signed)
Internal Medicine Clinic Attending  Case discussed with Dr. Molt at the time of the visit.  We reviewed the resident's history and exam and pertinent patient test results.  I agree with the assessment, diagnosis, and plan of care documented in the resident's note. 

## 2017-01-12 ENCOUNTER — Ambulatory Visit (HOSPITAL_COMMUNITY)
Admission: EM | Admit: 2017-01-12 | Discharge: 2017-01-12 | Disposition: A | Discharge status: Home / Self Care | Payer: Medicaid Other | Attending: Family Medicine | Admitting: Family Medicine

## 2017-01-12 ENCOUNTER — Other Ambulatory Visit: Payer: Self-pay

## 2017-01-12 ENCOUNTER — Encounter (HOSPITAL_COMMUNITY): Payer: Self-pay | Admitting: Emergency Medicine

## 2017-01-12 DIAGNOSIS — R6889 Other general symptoms and signs: Secondary | ICD-10-CM | POA: Diagnosis not present

## 2017-01-12 MED ORDER — HYDROCODONE-HOMATROPINE 5-1.5 MG/5ML PO SYRP: 5.0000 mL | ORAL_SOLUTION | Freq: Four times a day (QID) | ORAL | 0 refills | Status: DC | PRN

## 2017-01-12 NOTE — ED Triage Notes (Addendum)
Productive cough, frequent coughing.  Vomiting with eating.    Mark [id number]-interpreter.

## 2017-01-12 NOTE — Discharge Instructions (Signed)
Follow up with your primary care doctor or here if you are not seeing improvement of your symptoms over the next several days, sooner if you feel you are worsening.  Caring for yourself: Get plenty of rest. Drink plenty of fluids, enough so that your urine is light yellow or clear like water. If you have kidney, heart, or liver disease and have to limit fluids, talk with your doctor before you increase the amount of fluids you drink. Take an over-the-counter pain medicine if needed, such as acetaminophen (Tylenol), ibuprofen (Advil, Motrin), or naproxen (Aleve), to relieve fever, headache, and muscle aches. Read and follow all instructions on the label. No one younger than 20 should take aspirin. It has been linked to Reye syndrome, a serious illness. Before you use over the counter cough and cold medicines, check the label. These medicines may not be safe for children younger than age 29 or for people with certain health problems. If the skin around your nose and lips becomes sore, put some petroleum jelly on the area.  Avoid spreading the flu: Wash your hands regularly, and keep your hands away from your face.  Stay home from school, work, and other public places until you are feeling better and your fever has been gone for at least 24 hours. The fever needs to have gone away on its own without the help of medicine.  Be aware, your cough medication may cause drowsiness. Please do not drive, operate heavy machinery or make important decisions while on this medication, it can cloud your judgement.

## 2017-01-13 NOTE — ED Provider Notes (Signed)
[place]   [id number] 01/12/17 Arrival Time: 1924  ASSESSMENT & PLAN:  1. Flu-like symptoms    Meds ordered this encounter  Medications  . HYDROcodone-homatropine (HYCODAN) 5-1.5 MG/5ML syrup    Sig: Take 5 mLs by mouth every 6 (six) hours as needed for cough.    Dispense:  90 mL    Refill:  0   Medication sedation precautions. OTC symptom care as needed. Ensure adequate fluid intake and rest. May f/u with PCP or here as needed.  Reviewed expectations re: course of current medical issues. Questions answered. Outlined signs and symptoms indicating need for more acute intervention. Patient verbalized understanding. After Visit Summary given.   SUBJECTIVE: History from: patient. History through sign language interpreter. Janice Massey is a 62 y.o. female who presents with complaint of nasal congestion, post-nasal drainage, and a persistent dry cough. Cough bothering her the most; interfering with sleep. Onset abrupt, approximately several days ago. Overall fatigued with body aches. SOB: none. Wheezing: none. Fever: no. Overall normal PO intake without n/v. Sick contacts: no. OTC treatment: None. Mild nausea associated with coughing.  Received flu shot this year: no. Social History   Tobacco Use  Smoking Status Never Smoker  Smokeless Tobacco Never Used    ROS: As per HPI.   OBJECTIVE:  Vitals:   01/12/17 2006 01/12/17 2016  BP:  (!) 154/88  Pulse: 89 84  Resp:  18  Temp:  98.5 F (36.9 C)  SpO2:  98%     General appearance: alert; appears fatigued HEENT: nasal congestion; clear runny nose; throat irritation secondary to post-nasal drainage Neck: supple without LAD Lungs: unlabored respirations, symmetrical air entry; cough: moderate; no respiratory distress Skin: warm and dry Psychological: alert and cooperative; normal mood and affect   Allergies  Allergen Reactions  . Codeine Itching  . Lisinopril Cough  . Penicillins Itching    Has  patient had a PCN reaction causing immediate rash, facial/tongue/throat swelling, SOB or lightheadedness with hypotension: YES Has patient had a PCN reaction causing severe rash involving mucus membranes or skin necrosis: NO Has patient had a PCN reaction that required hospitalization NO Has patient had a PCN reaction occurring within the last 10 years: NO If all of the above answers are "NO", then may proceed with Cephalosporin use.    Past Medical History:  Diagnosis Date  . Atypical chest pain    myoview 10/29/10 - Post-stress EF 56%. Low risk and negative for ischemia   . Back pain   . Back pain   . 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 Chronic PID   . Chronic PID (chronic pelvic inflammatory disease)   . Deaf    Since age 30  . Diabetes mellitus   . Dizziness   . Dyslipidemia   . Dyspnea   . History of Doppler ultrasound 06/20/09   Normal, no prior studies for comparison.   . Hypertension   . Lightheadedness   . Mute   . Obese   . URI (upper respiratory infection)   . Ventricular septal defect 2004 per echo   small membranous VSD   Family History  Problem Relation Age of Onset  . Cancer Mother        liver  . Heart attack Mother 83  . Hypertension Father   . Diabetes Father   . Heart attack Father   . CAD Father 36  . Diabetes Paternal Aunt   .  Diabetes Maternal Grandfather    Social History   Socioeconomic History  . Marital status: Single    Spouse name: Not on file  . Number of children: 1  . Years of education: Not on file  . Highest education level: Not on file  Social Needs  . Financial resource strain: Not on file  . Food insecurity - worry: Not on file  . Food insecurity - inability: Not on file  . Transportation needs - medical: Not on file  . Transportation needs - non-medical: Not on file  Occupational History    Employer: UNEMPLOYED  Tobacco Use  . Smoking status: Never Smoker  .  Smokeless tobacco: Never Used  Substance and Sexual Activity  . Alcohol use: No    Alcohol/week: 0.0 oz  . Drug use: No  . Sexual activity: No    Birth control/protection: Surgical  Other Topics Concern  . Not on file  Social History Narrative  . Not on file           Vanessa Kick, MD 01/13/17 1130

## 2017-02-14 NOTE — Progress Notes (Signed)
   CC: follow-up of DM2 and HTN  HPI:  [name] is a 62 y.o. F here for follow-up of DM2 and HTN. She complains of recurrence of her right hip pain, treated as trochanteric bursitis, and requests referral to sports medicine for repeat injection. She is also excited with the births of several great grandchildren recently (1 boy, 1 girl).   DM2: Most recent A1c 7.7% 11/2016. On Metformin 2000mg  daily and invokana 100mg  daily. Frequent self-foot exams without ulcerations.  HTN: On HCTZ 25mg , and losartan 100 mg daily.   HLD: on Atorvastatin 40mg .   Past Medical History:  Diagnosis Date  . Atypical chest pain    myoview 10/29/10 - Post-stress EF 56%. Low risk and negative for ischemia   . Back pain   . Back pain   . 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 Chronic PID   . Chronic PID (chronic pelvic inflammatory disease)   . Deaf    Since age 49  . Diabetes mellitus   . Dizziness   . Dyslipidemia   . Dyspnea   . History of Doppler ultrasound 06/20/09   Normal, no prior studies for comparison.   . Hypertension   . Lightheadedness   . Mute   . Obese   . URI (upper respiratory infection)   . Ventricular septal defect 2004 per echo   small membranous VSD   Review of Systems:   General: Denies fevers, chills, weight loss, fatigue HEENT: Denies changes in vision, dysphagia Cardiac: Denies CP, SOB, palpitations Pulmonary: Denies cough, wheezes, PND Abd: Denies diarrhea, constipation, changes in bowels Extremities: Denies weakness or swelling  Physical Exam: General: Alert, in no acute distress. Pleasant and conversant. Sign interpretor present. HEENT: No icterus, injection or ptosis.  Cardiac: RRR, +faint systolic murmur Pulmonary: CTA BL with normal WOB on RA.  Abd: Soft, non-tender. +bs Extremities: Warm, perfused. No significant pedal edema. Feet without ulcerations. 1 small scrape on right shin from hitting coffee  table. Clean and without purulence or surrounding erythema.  Vitals:   02/15/17 1435 02/15/17 1504  BP: (!) 153/86 (!) 142/89  Pulse: (!) 111 (!) 108  Temp: 98.1 F (36.7 C)   TempSrc: Oral   SpO2: 100%   Weight: 192 lb 11.2 oz (87.4 kg)   Height: 5\' 7"  (1.702 m)    Assessment & Plan:   See Encounters Tab for problem based charting.  Patient discussed with Dr. Lynnae January

## 2017-02-15 ENCOUNTER — Other Ambulatory Visit: Payer: Self-pay

## 2017-02-15 ENCOUNTER — Encounter: Payer: Self-pay | Admitting: Internal Medicine

## 2017-02-15 ENCOUNTER — Ambulatory Visit: Payer: Medicaid Other | Admitting: Internal Medicine

## 2017-02-15 VITALS — BP 142/89 | HR 108 | Temp 98.1°F | Ht 67.0 in | Wt 192.7 lb

## 2017-02-15 DIAGNOSIS — I10 Essential (primary) hypertension: Secondary | ICD-10-CM

## 2017-02-15 DIAGNOSIS — Z79899 Other long term (current) drug therapy: Secondary | ICD-10-CM | POA: Diagnosis not present

## 2017-02-15 DIAGNOSIS — E119 Type 2 diabetes mellitus without complications: Secondary | ICD-10-CM

## 2017-02-15 DIAGNOSIS — Z7984 Long term (current) use of oral hypoglycemic drugs: Secondary | ICD-10-CM | POA: Diagnosis not present

## 2017-02-15 DIAGNOSIS — J309 Allergic rhinitis, unspecified: Secondary | ICD-10-CM

## 2017-02-15 DIAGNOSIS — E7800 Pure hypercholesterolemia, unspecified: Secondary | ICD-10-CM

## 2017-02-15 DIAGNOSIS — M7061 Trochanteric bursitis, right hip: Secondary | ICD-10-CM | POA: Diagnosis not present

## 2017-02-15 DIAGNOSIS — E785 Hyperlipidemia, unspecified: Secondary | ICD-10-CM

## 2017-02-15 DIAGNOSIS — M7060 Trochanteric bursitis, unspecified hip: Secondary | ICD-10-CM

## 2017-02-15 LAB — POCT GLYCOSYLATED HEMOGLOBIN (HGB A1C): Hemoglobin A1C: 7.4

## 2017-02-15 LAB — GLUCOSE, CAPILLARY: Glucose-Capillary: 149 mg/dL — ABNORMAL HIGH (ref 65–99)

## 2017-02-15 MED ORDER — OMEPRAZOLE 40 MG PO CPDR: 40.0000 mg | DELAYED_RELEASE_CAPSULE | Freq: Every day | ORAL | 2 refills | Status: DC

## 2017-02-15 MED ORDER — CANAGLIFLOZIN 100 MG PO TABS: 100.0000 mg | ORAL_TABLET | Freq: Every day | ORAL | 3 refills | Status: DC

## 2017-02-15 MED ORDER — DILTIAZEM HCL ER COATED BEADS 120 MG PO CP24: 120.0000 mg | ORAL_CAPSULE | Freq: Every day | ORAL | 1 refills | Status: DC

## 2017-02-15 MED ORDER — METFORMIN HCL 1000 MG PO TABS: 1000.0000 mg | ORAL_TABLET | Freq: Two times a day (BID) | ORAL | 2 refills | Status: DC

## 2017-02-15 MED ORDER — HYDROCHLOROTHIAZIDE 25 MG PO TABS: 25.0000 mg | ORAL_TABLET | Freq: Every day | ORAL | 5 refills | Status: DC

## 2017-02-15 NOTE — Assessment & Plan Note (Signed)
Hemoglobin A1c 7.4% today! Janice Massey has done an excellent job over the past several months working on improving her diet and exercising more. She also notes compliance with Metformin 2000 mg daily and Invokana 100mg  daily. I congratulated and encouraged Janice Massey to continue her current regimen and discussed the benefits of appropriate glycemic control.  -Continue Metformin 2000mg  daily and Invokana 100mg  daily -Patient to continue excellent work on diet and exercise -Repeat A1c 3 months

## 2017-02-15 NOTE — Assessment & Plan Note (Signed)
Notes recurrence of right trochanteric bursitis and requests referral to sports medicine for re-injection.  -Referral placed for sports medicine -Conservative management for now

## 2017-02-15 NOTE — Assessment & Plan Note (Signed)
Reviewed medications and she has been compliant with HCTZ 25mg  and Losartan 100mg  daily. She was hypertensive today initially and on repeat. She was also persistently tachycardic today although she denied any palpitations and rate was regular. BP goal for this patient with DM2 is <130/80.  -Start Diltiazem 120mg  XR daily -Continue HCTZ 25mg  and Losartan 100mg  -Obtain BMET at follow-up visit in 1 month

## 2017-02-15 NOTE — Patient Instructions (Addendum)
I'm glad you are doing well today!  Today we checked your blood sugar and A1c which was 7.4%!!!!! That is perfect! Please keep up the good work with taking your medications and diet changes.   Today we also talked about your blood pressure. I'm glad you are taking the right medicines everyday however unfortunately your blood pressure is still up. I understand your aunt is here in the hospital which can be stressful. Getting better control of your blood pressure will help reduce your risk of Stroke or Heart Attack. High blood pressure can also damage your kidneys over time. I would like to add a new blood pressure medication. Please take Diltiazem 120mg  daily. Please call the clinic should you experience any side effects.  I have sent in refills of your prescriptions to your pharmacy.   Please come back to clinic in 3-4 weeks for BP recheck  FOLLOW-UP INSTRUCTIONS When: 1 month For: BP follow-up, evaluate response to Diltiazem

## 2017-02-16 NOTE — Progress Notes (Signed)
Internal Medicine Clinic Attending  Case discussed with Dr. Molt at the time of the visit.  We reviewed the resident's history and exam and pertinent patient test results.  I agree with the assessment, diagnosis, and plan of care documented in the resident's note. 

## 2017-02-25 ENCOUNTER — Ambulatory Visit: Payer: Medicaid Other | Admitting: Family Medicine

## 2017-03-07 ENCOUNTER — Encounter: Payer: Self-pay | Admitting: Sports Medicine

## 2017-03-07 ENCOUNTER — Ambulatory Visit (INDEPENDENT_AMBULATORY_CARE_PROVIDER_SITE_OTHER): Payer: Medicaid Other | Admitting: Sports Medicine

## 2017-03-07 VITALS — BP 118/88 | Ht 67.0 in | Wt 180.0 lb

## 2017-03-07 DIAGNOSIS — M25559 Pain in unspecified hip: Secondary | ICD-10-CM | POA: Diagnosis not present

## 2017-03-07 MED ORDER — DICLOFENAC SODIUM 1 % TD GEL: 2.0000 g | Freq: Four times a day (QID) | TRANSDERMAL | 0 refills | Status: DC

## 2017-03-07 MED ORDER — METHYLPREDNISOLONE ACETATE 40 MG/ML IJ SUSP
40.0000 mg | Freq: Once | INTRAMUSCULAR | Status: AC
  Administered 2017-03-07: 40 mg via INTRA_ARTICULAR

## 2017-03-07 NOTE — Assessment & Plan Note (Addendum)
Acute on chronic.  Consistent with trochanteric pain syndrome of right hip.  Left hip unaffected.  No signs of septic arthritis.  Does not seem to affect acetabulum based on exam.  No signs of effusion.  Patient does have history of similar problem with complete resolution following a right injection.  Does have fairly well-controlled diabetes without need for insulin.  Patient is a good candidate for steroid injection today. - Methylprednisolone injected into right trochanteric bursa - Voltaren gel 4 times daily as needed to affected area - Reviewed return precautions, follow-up with PCP

## 2017-03-07 NOTE — Progress Notes (Signed)
Subjective   Patient ID: DESTINE ZIRKLE    DOB: 11-16-1955, 62 y.o. female   MRN: [id number]  CC: "Right hip pain"  HPI: Janice Massey is a 62 y.o. female who presents to clinic today for the following:  Right hip pain: Onset 4 months with intermittent sharp 8/10 pain on lateral surface of right hip.  Patient said she experienced a mechanical fall 1 month ago after running into the coffee table which she states is unrelated to her hip pain.  The pain is not exacerbated by any particular movement or time of day.  Patient states she can feel it while walking and lying in bed.  She is not tried any medications or conservative therapies.  Patient had a similar pain back in June 2017 with resolution following single thyroid injection of right trochanteric bursa.  She feels her current pain is related to the same issue.  Patient referred by PCP for consideration of steroid injection.  She does have somewhat controlled diabetes without need for insulin.  He is a non-smoker.  Patient denies fevers or chills, loss of motor function, sensory loss.  ROS: see HPI for pertinent.  [place]: NIDT2DM, HTN, HLD, GERD, asthma, VSD, cervical spine injury, trochanteric pain syndrome.  Surgical history cholecystectomy, cardiac cath, tubal.  Family history DM, HTN, heart disease, cancer (mother, liver).  Smoking status reviewed. Medications reviewed.  Objective   BP 118/88   Ht 5\' 7"  (1.702 m)   Wt 180 lb (81.6 kg)   BMI 28.19 kg/m  Vitals and nursing note reviewed.  General: well nourished, well developed, NAD with non-toxic appearance HEENT: normocephalic, atraumatic, moist mucous membranes Cardiovascular: regular rate and rhythm without murmurs, rubs, or gallops Lungs: clear to auscultation bilaterally with normal work of breathing Skin: warm, dry, no rashes or lesions, cap refill < 2 seconds Extremities: warm and well perfused, normal tone, no edema MSK: symmetrical leg length without abnormality while  standing, no obvious edema, ecchymoses or mass palpated, passive and active ROM intact however tenderness elicited with all movement localized to right lateral hip, negative internal and external rotation of hip, FABER test negative, neurovascular intact, motor strength 5/5 lower extremities bilaterally  Procedure Note: Right Trochanteric Bursa Injection Patient was given informed consent, signed copy in the chart. Appropriate time out was taken. Area prepped and draped in usual sterile fashion.  Cryotherapy used to numb area followed by 10 cc of methylprednisolone 40 mg/ml without epinephrine was injected into the right trochanteric bursa using a lateral approach. The patient tolerated the procedure well. There were no complications. Post procedure instructions were given.  Assessment & Plan   Greater trochanteric pain syndrome Acute on chronic.  Consistent with trochanteric pain syndrome of right hip.  Left hip unaffected.  No signs of septic arthritis.  Does not seem to affect acetabulum based on exam.  No signs of effusion.  Patient does have history of similar problem with complete resolution following a right injection.  Does have fairly well-controlled diabetes without need for insulin.  Patient is a good candidate for steroid injection today. - Methylprednisolone injected into right trochanteric bursa - Voltaren gel 4 times daily as needed to affected area - Reviewed return precautions, follow-up with PCP  No orders of the defined types were placed in this encounter.  Meds ordered this encounter  Medications  . methylPREDNISolone acetate (DEPO-MEDROL) injection 40 mg  . diclofenac sodium (VOLTAREN) 1 % GEL    Sig: Apply 2 g topically 4 (four)  times daily.    Dispense:  100 g    Refill:  0    Harriet Butte, DO [place], PGY-2 03/07/2017, 11:23 AM   Patient seen and evaluated with the resident. I agree with the above plan of care. Patient's physical exam shows good  hip range of motion with tenderness to palpation over the greater trochanteric bursa. Cortisone injection was administered today into the area of the greater trochanteric bursa. Patient tolerated this without difficulty. Prescription for Voltaren gel was prescribed. Patient will follow-up for ongoing or recalcitrant issues.

## 2017-03-15 ENCOUNTER — Encounter: Payer: Self-pay | Admitting: Internal Medicine

## 2017-03-15 ENCOUNTER — Other Ambulatory Visit: Payer: Self-pay | Admitting: Internal Medicine

## 2017-03-15 ENCOUNTER — Other Ambulatory Visit: Payer: Self-pay

## 2017-03-15 ENCOUNTER — Ambulatory Visit: Payer: Medicaid Other | Admitting: Internal Medicine

## 2017-03-15 VITALS — BP 138/76 | HR 91 | Temp 98.0°F | Ht 67.0 in | Wt 186.5 lb

## 2017-03-15 DIAGNOSIS — I10 Essential (primary) hypertension: Secondary | ICD-10-CM

## 2017-03-15 DIAGNOSIS — J069 Acute upper respiratory infection, unspecified: Secondary | ICD-10-CM

## 2017-03-15 DIAGNOSIS — H9190 Unspecified hearing loss, unspecified ear: Secondary | ICD-10-CM

## 2017-03-15 DIAGNOSIS — Z79899 Other long term (current) drug therapy: Secondary | ICD-10-CM

## 2017-03-15 DIAGNOSIS — M25559 Pain in unspecified hip: Secondary | ICD-10-CM | POA: Diagnosis not present

## 2017-03-15 DIAGNOSIS — R011 Cardiac murmur, unspecified: Secondary | ICD-10-CM | POA: Diagnosis not present

## 2017-03-15 MED ORDER — FLUTICASONE PROPIONATE 50 MCG/ACT NA SUSP: 2.0000 | Freq: Every day | NASAL | 0 refills | Status: DC

## 2017-03-15 MED ORDER — HYDROCODONE-HOMATROPINE 5-1.5 MG/5ML PO SYRP: 5.0000 mL | ORAL_SOLUTION | Freq: Four times a day (QID) | ORAL | 0 refills | Status: DC | PRN

## 2017-03-15 MED ORDER — MUCINEX DM 30-600 MG PO TB12: 1.0000 | ORAL_TABLET | Freq: Two times a day (BID) | ORAL | 0 refills | Status: AC

## 2017-03-15 NOTE — Assessment & Plan Note (Addendum)
Blood pressure just above goal at 138/76 on presentation however here with acute URI. She is tolerating addition of Diltiazem 120mg  XR well and denied any side effects. I reviewed her prescription bottles which were appropriately filled.  -Continue Diltiazem XR 120mg  dailiy -Continue Losartan 100mg  and HCTZ 25mg  as well -BMET at next clinic visit in May

## 2017-03-15 NOTE — Progress Notes (Signed)
   CC: URI, follow-up of HTN  HPI:  [name] is a 62 y.o. F here for 1-week history of URI and also follow-up of HTN. Please see problem-based charting for details of this and other chronic medical conditions addressed during this visit.  Past Medical History:  Diagnosis Date  . Atypical chest pain    myoview 10/29/10 - Post-stress EF 56%. Low risk and negative for ischemia   . Back pain   . Back pain   . 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 Chronic PID   . Chronic PID (chronic pelvic inflammatory disease)   . Deaf    Since age 42  . Diabetes mellitus   . Dizziness   . Dyslipidemia   . Dyspnea   . History of Doppler ultrasound 06/20/09   Normal, no prior studies for comparison.   . Hypertension   . Lightheadedness   . Mute   . Obese   . URI (upper respiratory infection)   . Ventricular septal defect 2004 per echo   small membranous VSD   Review of Systems:   General: +Chills, +fatigue. Denies fevers, weight loss HEENT: +Sore throat, nasal/sinus congestion, ear fullness. Denies changes in vision, dysphagia Cardiac: Denies CP, SOB, palpitations Pulmonary: +Cough (green/yellow sputum). Denies wheezes, PND Abd: Denies changes in bowels, blood or melena Extremities: Denies weakness or swelling  Physical Exam: General: Deaf, interpreter present. Alert, in no acute distress, does look a little fatigued. Pleasant and conversant.  HEENT: Tonsillar and pharyngeal erythema, no exudates. Mild TTP maxillary and frontal sinuses. Serous fluid behind BL TM, no erythema, bulging, bubbles or purulence. No icterus, injection or ptosis.  Cardiac: RRR, +faint systolic murmur Pulmonary: CTA BL with normal WOB on RA. Ambulated without hypoxia. Abd: Soft, non-tender. +bs Extremities: Warm, perfused. No significant pedal edema.   Vitals:   03/15/17 1335  BP: 138/76  Pulse: 91  Temp: 98 F (36.7 C)  TempSrc: Oral  SpO2: 97%    Weight: 186 lb 8 oz (84.6 kg)  Height: 5\' 7"  (1.702 m)   Assessment & Plan:   See Encounters Tab for problem based charting.  Patient discussed with Dr. Rebeca Alert

## 2017-03-15 NOTE — Assessment & Plan Note (Addendum)
Patient with 1 week history of sore throat, myalgias which progressed to nasal and sinus congestion, ear fullness and cough occasionally productive of yellow/green tinged sputum. She denies any fevers at home although felt cold last night in bed. She denies any DOE. She is afebrile, lungs are clear and does not seem to have sign of bacterial infection. She reports possible exposure to several sick contacts including at a recent funeral and being around her grandchildren/great-grandchildren. She has been using cough syrup prescribed several months ago by the ED to help her sleep but ran out.  I suspect this is a viral URI which will resolve on its own within the next week or so with supportive therapy. Doubt flu, but presented outside window for Tamiflu. Lungs are clear. She does not have signs indicating a bacterial infection which would require antibiotics at this time. Will try conservative therapy at this point, patient instructed to RTC should her symptoms worsen. -Rx for Hycodan sent to pharmacy to be used at night -Recommended Mucinex DM to be used during the day -Also sent in Rx for flonase, as she does have history of seasonal allergies which could be contributing -Tylenol or Ibuprofen for myalgias, fevers -Pt to RTC should symptoms worsen or fail to improve

## 2017-03-15 NOTE — Assessment & Plan Note (Signed)
S/p steroid injection with Dr. Micheline Chapman 3/4. Also prescribed voltaren gel.

## 2017-03-15 NOTE — Patient Instructions (Addendum)
It was great seeing you today! I'm sorry you are feeling under the weather.   It sounds like you have a viral upper respiratory tract infection. These are self-limited, meaning they should go away on their own. There are ways we can help reduce your symptoms including: -Refill of the cough syrup to use at night because you are having trouble sleeping -Mucinex DM to use during the day to help with cough and break-up plegm -Flonase to use daily to help reduce sinus and ear pressure -Tylenol or Ibuprofen for fevers or body aches  Sometimes it takes a few weeks for the cough to fully resolve after these viral infections. Please call the clinic and let me know if you have persistent fevers (>101), or significant worsening of your symptoms.   Your blood pressure is doing better on that new medication! Please keep taking your medications as prescribed!

## 2017-03-17 NOTE — Progress Notes (Signed)
Internal Medicine Clinic Attending  Case discussed with Dr. Molt  at the time of the visit.  We reviewed the resident's history and exam and pertinent patient test results.  I agree with the assessment, diagnosis, and plan of care documented in the resident's note.  Alexander N Raines, MD   

## 2017-03-18 ENCOUNTER — Telehealth: Payer: Self-pay

## 2017-03-18 ENCOUNTER — Other Ambulatory Visit: Payer: Self-pay | Admitting: Internal Medicine

## 2017-03-18 DIAGNOSIS — Z1231 Encounter for screening mammogram for malignant neoplasm of breast: Secondary | ICD-10-CM

## 2017-03-18 MED ORDER — BENZONATATE 200 MG PO CAPS: 200.0000 mg | ORAL_CAPSULE | Freq: Three times a day (TID) | ORAL | 0 refills | Status: DC | PRN

## 2017-03-18 NOTE — Telephone Encounter (Signed)
Pt states she still waiting for cough med. Please call pt back.

## 2017-03-18 NOTE — Telephone Encounter (Signed)
Ordered generic tessalon, sent to pharmacy.

## 2017-03-18 NOTE — Telephone Encounter (Signed)
Called / informed pt of new rx for Tessalon.

## 2017-03-18 NOTE — Telephone Encounter (Signed)
[place] - stated Janice Massey is not covered by Medicaid; and Mucinex DM can be purchased OTC so it's not covered neither. Pharmacist stated Promethazine DM might be covered  By Medicare - unsure. Called / talked to pt - explained. Stated she will purchase Mucinex DM OTC. And I will let her know if her doctor order a different cough syrup. Stated ok.

## 2017-04-03 ENCOUNTER — Other Ambulatory Visit: Payer: Self-pay | Admitting: Internal Medicine

## 2017-04-07 ENCOUNTER — Encounter (HOSPITAL_COMMUNITY): Payer: Self-pay | Admitting: Emergency Medicine

## 2017-04-07 ENCOUNTER — Other Ambulatory Visit: Payer: Self-pay

## 2017-04-07 DIAGNOSIS — Z79899 Other long term (current) drug therapy: Secondary | ICD-10-CM | POA: Diagnosis not present

## 2017-04-07 DIAGNOSIS — R112 Nausea with vomiting, unspecified: Secondary | ICD-10-CM | POA: Insufficient documentation

## 2017-04-07 DIAGNOSIS — R0981 Nasal congestion: Secondary | ICD-10-CM | POA: Diagnosis not present

## 2017-04-07 DIAGNOSIS — E119 Type 2 diabetes mellitus without complications: Secondary | ICD-10-CM | POA: Insufficient documentation

## 2017-04-07 DIAGNOSIS — J209 Acute bronchitis, unspecified: Secondary | ICD-10-CM | POA: Diagnosis not present

## 2017-04-07 DIAGNOSIS — R05 Cough: Secondary | ICD-10-CM | POA: Diagnosis present

## 2017-04-07 NOTE — ED Triage Notes (Signed)
Pt from home via EMS with c/o nasal congestion, cough and chills x 2 days. Pt states her CBG has been 200 today. Pt reports nausea/vomiting/diarrhea x 2 days. Pt is afebrile at time of assessment. Pt is deaf and needs interpreter

## 2017-04-08 ENCOUNTER — Emergency Department (HOSPITAL_COMMUNITY)
Admission: EM | Admit: 2017-04-08 | Discharge: 2017-04-08 | Disposition: A | Discharge status: Home / Self Care | Payer: Medicaid Other | Attending: Emergency Medicine | Admitting: Emergency Medicine

## 2017-04-08 ENCOUNTER — Emergency Department (HOSPITAL_COMMUNITY): Payer: Medicaid Other

## 2017-04-08 DIAGNOSIS — R112 Nausea with vomiting, unspecified: Secondary | ICD-10-CM

## 2017-04-08 DIAGNOSIS — J209 Acute bronchitis, unspecified: Secondary | ICD-10-CM

## 2017-04-08 LAB — BASIC METABOLIC PANEL
ANION GAP: 11 (ref 5–15)
BUN: 11 mg/dL (ref 6–20)
CALCIUM: 9.7 mg/dL (ref 8.9–10.3)
CO2: 28 mmol/L (ref 22–32)
Chloride: 100 mmol/L — ABNORMAL LOW (ref 101–111)
Creatinine, Ser: 0.8 mg/dL (ref 0.44–1.00)
GFR calc non Af Amer: >60 mL/min (ref >60)
GLUCOSE: 139 mg/dL — AB (ref 65–99)
POTASSIUM: 3.4 mmol/L — AB (ref 3.5–5.1)
Sodium: 139 mmol/L (ref 135–145)

## 2017-04-08 LAB — CBC WITH DIFFERENTIAL/PLATELET
BASOS PCT: 0 %
Basophils Absolute: 0 10*3/uL (ref 0.0–0.1)
Eosinophils Absolute: 0.1 10*3/uL (ref 0.0–0.7)
Eosinophils Relative: 1 %
HEMATOCRIT: 39 % (ref 36.0–46.0)
Hemoglobin: 12.8 g/dL (ref 12.0–15.0)
Lymphocytes Relative: 41 %
Lymphs Abs: 2.7 10*3/uL (ref 0.7–4.0)
MCH: 28.8 pg (ref 26.0–34.0)
MCHC: 32.8 g/dL (ref 30.0–36.0)
MCV: 87.8 fL (ref 78.0–100.0)
MONO ABS: 0.7 10*3/uL (ref 0.1–1.0)
Monocytes Relative: 11 %
NEUTROS ABS: 3 10*3/uL (ref 1.7–7.7)
Neutrophils Relative %: 47 %
PLATELETS: 292 10*3/uL (ref 150–400)
RBC: 4.44 MIL/uL (ref 3.87–5.11)
RDW: 13.4 % (ref 11.5–15.5)
WBC: 6.5 10*3/uL (ref 4.0–10.5)

## 2017-04-08 MED ORDER — ONDANSETRON HCL 4 MG/2ML IJ SOLN
4.0000 mg | Freq: Once | INTRAMUSCULAR | Status: AC
  Administered 2017-04-08: 4 mg via INTRAVENOUS
  Filled 2017-04-08: qty 2

## 2017-04-08 MED ORDER — ALBUTEROL SULFATE HFA 108 (90 BASE) MCG/ACT IN AERS: 2.0000 | INHALATION_SPRAY | RESPIRATORY_TRACT | 0 refills | Status: DC | PRN

## 2017-04-08 MED ORDER — IPRATROPIUM-ALBUTEROL 0.5-2.5 (3) MG/3ML IN SOLN
3.0000 mL | Freq: Once | RESPIRATORY_TRACT | Status: AC
  Administered 2017-04-08: 3 mL via RESPIRATORY_TRACT
  Filled 2017-04-08: qty 3

## 2017-04-08 MED ORDER — DOXYCYCLINE HYCLATE 100 MG PO CAPS: 100.0000 mg | ORAL_CAPSULE | Freq: Two times a day (BID) | ORAL | 0 refills | Status: DC

## 2017-04-08 MED ORDER — DIPHENHYDRAMINE HCL 50 MG/ML IJ SOLN
25.0000 mg | Freq: Once | INTRAMUSCULAR | Status: AC
  Administered 2017-04-08: 25 mg via INTRAVENOUS
  Filled 2017-04-08: qty 1

## 2017-04-08 MED ORDER — METOCLOPRAMIDE HCL 5 MG/ML IJ SOLN
10.0000 mg | Freq: Once | INTRAMUSCULAR | Status: AC
  Administered 2017-04-08: 10 mg via INTRAVENOUS
  Filled 2017-04-08: qty 2

## 2017-04-08 MED ORDER — SODIUM CHLORIDE 0.9 % IV BOLUS
1000.0000 mL | Freq: Once | INTRAVENOUS | Status: AC
  Administered 2017-04-08: 1000 mL via INTRAVENOUS

## 2017-04-08 MED ORDER — PROMETHAZINE HCL 25 MG/ML IJ SOLN
25.0000 mg | Freq: Once | INTRAMUSCULAR | Status: DC
  Filled 2017-04-08 (×2): qty 1

## 2017-04-08 MED ORDER — PROCHLORPERAZINE MALEATE 10 MG PO TABS: 10.0000 mg | ORAL_TABLET | Freq: Four times a day (QID) | ORAL | 0 refills | Status: DC | PRN

## 2017-04-08 MED ORDER — BENZONATATE 200 MG PO CAPS: 200.0000 mg | ORAL_CAPSULE | Freq: Three times a day (TID) | ORAL | 0 refills | Status: DC | PRN

## 2017-04-08 NOTE — ED Notes (Signed)
PT ABLE TO UNDERSTAND DISCHARGE INSTRUCTIONS. FAMILY AT DISCHARGE

## 2017-04-08 NOTE — ED Notes (Signed)
ED Provider at bedside. EDP [facility] 

## 2017-04-08 NOTE — ED Provider Notes (Signed)
[facility] DEPT Provider Note   CSN: [id number] Arrival date & time: 04/07/17  1953     History   Chief Complaint Chief Complaint  Patient presents with  . Nasal Congestion  . Cough    HPI Janice Massey is a 62 y.o. female.  The history is provided by the patient. A language interpreter was used (Sign language).  She has a history of diabetes, hyperlipidemia, hypertension, deafness and comes in with a 5-day history of cough productive of green sputum.  She has had subjective fever as well as chills and sweats.  She denies arthralgias or myalgias.  She has also been having nausea and vomiting.  Vomiting has been from nausea as well as posttussive.  She had diarrhea for 2 days, but none today.  There is some abdominal soreness which she relates to her coughing and vomiting.  She has not treated this with anything at home.  She denies any sick contacts.  Past Medical History:  Diagnosis Date  . Atypical chest pain    myoview 10/29/10 - Post-stress EF 56%. Low risk and negative for ischemia   . Back pain   . Back pain   . 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 Chronic PID   . Chronic PID (chronic pelvic inflammatory disease)   . Deaf    Since age 37  . Diabetes mellitus   . Dizziness   . Dyslipidemia   . Dyspnea   . History of Doppler ultrasound 06/20/09   Normal, no prior studies for comparison.   . Hypertension   . Lightheadedness   . Mute   . Obese   . URI (upper respiratory infection)   . Ventricular septal defect 2004 per echo   small membranous VSD    Patient Active Problem List   Diagnosis Date Noted  . Acute upper respiratory infection 03/15/2017  . 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 VSD (ventricular septal defect), perimembranous 05/29/2013  . Health care maintenance 11/09/2011  . Essential hypertension 08/10/2011  . Hyperlipidemia 10/11/2007  . Hearing loss  11/10/2005  . Asthma 11/10/2005  . GERD 11/10/2005  . SYSTOLIC MURMUR 13/08/6576  . INJURY, CERVICAL ROOT 11/10/2005  . Diabetes ([facility]) 04/05/2002    Past Surgical History:  Procedure Laterality Date  . CARDIAC CATHETERIZATION  08/23/95  . CHOLECYSTECTOMY  11/04/1993  . COLPOSCOPY    . TUBAL LIGATION  10/05/1978     OB History    Gravida  2   Para  2   Term  2   Preterm      AB      Living  2     SAB      TAB      Ectopic      Multiple      Live Births               Home Medications    Prior to Admission medications   Medication Sig Start Date End Date Taking? Authorizing Provider  atorvastatin (LIPITOR) 40 MG tablet Take 1 tablet (40 mg total) daily by mouth. 11/16/16   Molt, Bethany, DO  benzonatate (TESSALON) 200 MG capsule Take 1 capsule (200 mg total) by mouth 3 (three) times daily as needed for cough. 03/18/17   Molt, Bethany, DO  canagliflozin (INVOKANA) 100 MG TABS tablet Take 1 tablet (100 mg total) by mouth daily before breakfast. 02/15/17   Molt, Boody,  DO  CARTIA XT 120 MG 24 hr capsule TAKE 1 CAPSULE(120 MG) BY MOUTH DAILY 04/04/17   Molt, Bethany, DO  diclofenac sodium (VOLTAREN) 1 % GEL Apply 2 g topically 4 (four) times daily. 03/07/17   St. Landry Bing, DO  fluticasone (FLONASE) 50 MCG/ACT nasal spray Place 2 sprays into both nostrils daily for 7 days. 03/15/17 03/22/17  Molt, Bethany, DO  HYDROcodone-homatropine (HYCODAN) 5-1.5 MG/5ML syrup Take 5 mLs by mouth every 6 (six) hours as needed for cough. 03/15/17   Molt, Bethany, DO  losartan (COZAAR) 100 MG tablet Take 1 tablet (100 mg total) daily by mouth. 11/16/16   Molt, Bethany, DO    Family History Family History  Problem Relation Age of Onset  . Cancer Mother        liver  . Heart attack Mother 48  . Hypertension Father   . Diabetes Father   . Heart attack Father   . CAD Father 25  . Diabetes Paternal Aunt   . Diabetes Maternal Grandfather     Social History Social History    Tobacco Use  . Smoking status: Never Smoker  . Smokeless tobacco: Never Used  Substance Use Topics  . Alcohol use: No    Alcohol/week: 0.0 oz  . Drug use: No     Allergies   Codeine; Lisinopril; and Penicillins   Review of Systems Review of Systems  All other systems reviewed and are negative.    Physical Exam Updated Vital Signs BP (!) 138/92 (BP Location: Left Arm)   Pulse 93   Temp 98.9 F (37.2 C) (Oral)   Resp 12   Ht 5\' 7"  (1.702 m)   Wt 81.6 kg (180 lb)   SpO2 96%   BMI 28.19 kg/m   Physical Exam  Nursing note and vitals reviewed.  62 year old female, resting comfortably and in no acute distress. Vital signs are significant for borderline elevated diastolic blood pressure. Oxygen saturation is 96%, which is normal. Head is normocephalic and atraumatic. PERRLA, EOMI. Oropharynx is clear. Neck is nontender and supple without adenopathy or JVD. Back is nontender and there is no CVA tenderness. Lungs are clear without rales, wheezes, or rhonchi. Chest is nontender. Heart has regular rate and rhythm without murmur. Abdomen is soft, flat, nontender without masses or hepatosplenomegaly and peristalsis is hypoactive. Extremities have no cyanosis or edema, full range of motion is present. Skin is warm and dry without rash. Neurologic: Mental status is normal, cranial nerves are intact, there are no motor or sensory deficits.  ED Treatments / Results  Labs (all labs ordered are listed, but only abnormal results are displayed) Labs Reviewed  BASIC METABOLIC PANEL - Abnormal; Notable for the following components:      Result Value   Potassium 3.4 (*)    Chloride 100 (*)    Glucose, Bld 139 (*)    All other components within normal limits  CBC WITH DIFFERENTIAL/PLATELET    Radiology Dg Chest 2 View  Result Date: 04/08/2017 CLINICAL DATA:  Cough for 1 week EXAM: CHEST - 2 VIEW COMPARISON:  04/12/2014 FINDINGS: No acute pulmonary infiltrate or effusion.  Cardiomediastinal silhouette within normal limits. No pneumothorax. Degenerative changes of the spine. IMPRESSION: No active cardiopulmonary disease. Electronically Signed   By: Donavan Foil M.D.   On: 04/08/2017 02:50    Procedures Procedures   Medications Ordered in ED Medications  promethazine (PHENERGAN) injection 25 mg (25 mg Intravenous Not Given 04/08/17 0744)  ondansetron (ZOFRAN) injection  4 mg (4 mg Intravenous Given 04/08/17 0324)  sodium chloride 0.9 % bolus 1,000 mL (0 mLs Intravenous Stopped 04/08/17 0435)  ipratropium-albuterol (DUONEB) 0.5-2.5 (3) MG/3ML nebulizer solution 3 mL (3 mLs Nebulization Given 04/08/17 0305)  metoCLOPramide (REGLAN) injection 10 mg (10 mg Intravenous Given 04/08/17 0518)  diphenhydrAMINE (BENADRYL) injection 25 mg (25 mg Intravenous Given 04/08/17 0518)     Initial Impression / Assessment and Plan / ED Course  I have reviewed the triage vital signs and the nursing notes.  Pertinent labs & imaging results that were available during my care of the patient were reviewed by me and considered in my medical decision making (see chart for details).  Respiratory tract infection-possible influenza.  She is outside the treatment window to initiate antiviral medication, so influenza testing is not done.  Will send for chest x-ray to rule out pneumonia.  She is being given IV fluids because of vomiting and diarrhea.  She is given dose of ondansetron.  We will also try a therapeutic trial of albuterol with ipratropium to see if it helps with her coughing.  Old records are reviewed, and she had an urgent care visit 3 months ago for similar illness.  4:47 AM Her cough is improved following albuterol with ipratropium.  Chest x-ray shows no evidence of pneumonia.  Labs are reassuring-no leukocytosis.  Patient is complaining of severe nausea despite dose of ondansetron.  She will be given metoclopramide and reassessed.  She vomited after getting metoclopramide and was given  prochlorperazine.  She initially vomited shortly after getting prochlorperazine, but nausea has since abated.  Given worsening of her symptoms 5 days into her course, will treat for possible bacterial superinfection.  She is discharged with prescriptions for amoxicillin, benzonatate, prochlorperazine, and albuterol inhaler.  Follow-up with PCP in 1 week.  Return precautions discussed.  Final Clinical Impressions(s) / ED Diagnoses   Final diagnoses:  Acute bronchitis, unspecified organism  Non-intractable vomiting with nausea, unspecified vomiting type    ED Discharge Orders        Ordered    benzonatate (TESSALON) 200 MG capsule  3 times daily PRN     04/08/17 0749    doxycycline (VIBRAMYCIN) 100 MG capsule  2 times daily     04/08/17 0749    albuterol (PROVENTIL HFA;VENTOLIN HFA) 108 (90 Base) MCG/ACT inhaler  Every 4 hours PRN     04/08/17 0749    prochlorperazine (COMPAZINE) 10 MG tablet  Every 6 hours PRN     42/35/36 1443       Delora Fuel, MD 15/40/08 [phone]

## 2017-05-02 ENCOUNTER — Ambulatory Visit
Admission: RE | Admit: 2017-05-02 | Discharge: 2017-05-02 | Disposition: A | Discharge status: Home / Self Care | Payer: Medicaid Other | Source: Ambulatory Visit | Attending: Internal Medicine | Admitting: Internal Medicine

## 2017-05-02 ENCOUNTER — Other Ambulatory Visit: Payer: Self-pay | Admitting: Internal Medicine

## 2017-05-02 DIAGNOSIS — Z1231 Encounter for screening mammogram for malignant neoplasm of breast: Secondary | ICD-10-CM

## 2017-05-17 ENCOUNTER — Encounter: Payer: Self-pay | Admitting: Internal Medicine

## 2017-05-17 ENCOUNTER — Ambulatory Visit (INDEPENDENT_AMBULATORY_CARE_PROVIDER_SITE_OTHER): Payer: Medicaid Other | Admitting: Internal Medicine

## 2017-05-17 ENCOUNTER — Other Ambulatory Visit: Payer: Self-pay

## 2017-05-17 VITALS — BP 128/76 | HR 99 | Temp 98.4°F | Ht 67.0 in | Wt 184.5 lb

## 2017-05-17 DIAGNOSIS — R011 Cardiac murmur, unspecified: Secondary | ICD-10-CM

## 2017-05-17 DIAGNOSIS — E119 Type 2 diabetes mellitus without complications: Secondary | ICD-10-CM

## 2017-05-17 DIAGNOSIS — N183 Chronic kidney disease, stage 3 (moderate): Secondary | ICD-10-CM | POA: Diagnosis not present

## 2017-05-17 DIAGNOSIS — E1122 Type 2 diabetes mellitus with diabetic chronic kidney disease: Secondary | ICD-10-CM | POA: Diagnosis not present

## 2017-05-17 DIAGNOSIS — Z7984 Long term (current) use of oral hypoglycemic drugs: Secondary | ICD-10-CM | POA: Diagnosis not present

## 2017-05-17 DIAGNOSIS — I10 Essential (primary) hypertension: Secondary | ICD-10-CM

## 2017-05-17 DIAGNOSIS — Z79899 Other long term (current) drug therapy: Secondary | ICD-10-CM

## 2017-05-17 LAB — POCT GLYCOSYLATED HEMOGLOBIN (HGB A1C): Hemoglobin A1C: 7.1

## 2017-05-17 LAB — GLUCOSE, CAPILLARY: GLUCOSE-CAPILLARY: 151 mg/dL — AB (ref 65–99)

## 2017-05-17 MED ORDER — METFORMIN HCL 1000 MG PO TABS: 1000.0000 mg | ORAL_TABLET | Freq: Two times a day (BID) | ORAL | 1 refills | Status: DC

## 2017-05-17 MED ORDER — LOSARTAN POTASSIUM 100 MG PO TABS: 100.0000 mg | ORAL_TABLET | Freq: Every day | ORAL | 2 refills | Status: DC

## 2017-05-17 MED ORDER — CANAGLIFLOZIN 100 MG PO TABS: 100.0000 mg | ORAL_TABLET | Freq: Every day | ORAL | 1 refills | Status: DC

## 2017-05-17 NOTE — Assessment & Plan Note (Signed)
Assessment: A1c 7.1% today! No issues with Metformin or Invokana and appears to be compliant from review of her medication bottles today.   Plan: Continue current regimen. Encouraged patient to keep up her hard work but did encourage appropriate diet and exercise as well. Foot exam performed today was normal.

## 2017-05-17 NOTE — Progress Notes (Signed)
   CC: follow-up of DM2, HTN  HPI:  [name] is a 62 y.o. F who presents for evaluation of DM2 and HTN. She is accompanied by her ASL translator who assisted with visit.  For details regarding today's visit and the status of their chronic medical issues, please refer to the assessment and plan.  Past Medical History:  Diagnosis Date  . Atypical chest pain    myoview 10/29/10 - Post-stress EF 56%. Low risk and negative for ischemia   . Back pain   . Back pain   . 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 Chronic PID   . Chronic PID (chronic pelvic inflammatory disease)   . Deaf    Since age 25  . Diabetes mellitus   . Dizziness   . Dyslipidemia   . Dyspnea   . History of Doppler ultrasound 06/20/09   Normal, no prior studies for comparison.   . Hypertension   . Lightheadedness   . Mute   . Obese   . URI (upper respiratory infection)   . Ventricular septal defect 2004 per echo   small membranous VSD   Review of Systems:   General: Denies fevers, chills, weight loss HEENT: Denies acute changes in vision Cardiac: Denies CP, SOB, palpitations Pulmonary: Denies coughing Abd: Denies abdominal pain, changes in bowels Extremities: Denies weakness or swelling  Physical Exam: General: Alert, in no acute distress. Pleasant and conversant HEENT: No icterus, injection or ptosis. Deaf, able to verbalize some words Cardiac: RRR, faint systolic murmur Pulmonary: CTA BL with normal WOB on RA. Abd: Soft, non-tender. +bs Extremities: Warm, perfused. No significant pedal edema. Sensation intact BL feet. No ulceration, calluses or skin breaks.   Vitals:   05/17/17 1354 05/17/17 1410  BP: 136/86 128/76  Pulse: 99   Temp: 98.4 F (36.9 C)   TempSrc: Oral   SpO2: 98%   Weight: 184 lb 8 oz (83.7 kg)   Height: 5\' 7"  (1.702 m)    Body mass index is 28.9 kg/m.  Assessment & Plan:   See Encounters Tab for problem based  charting.  Patient discussed with Dr. Angelia Mould

## 2017-05-17 NOTE — Patient Instructions (Addendum)
I'm so happy to see you today! I'm glad you are doing well.   Today we talked about your diabetes. Your A1c was 7.1% which is excellent! Please keep up the great work in taking your medications and working on your diet and exercise. I have sent in refills of both your Metformin and Invokana.   Today we also talked about your blood pressure. It looks great today! Please keep up the great work with your medications and diet.   I will see you back in 3 months for follow-up of Diabetes and blood pressure, although I'm happy to see you sooner should any issue come up!

## 2017-05-17 NOTE — Assessment & Plan Note (Addendum)
Assessment: Blood pressure right above goal but actually normalized to 128/76 on repeat measurement. Denied any cough, SOB, weakness or dizziness with current regimen. Had CMET during ED visit last month for bronchitis with normal renal function and stable lytes.   Plan: Will continue Diltiazem 120mg  XR, Losartan 100mg  and HCTZ 25mg .

## 2017-05-19 ENCOUNTER — Telehealth: Payer: Self-pay | Admitting: *Deleted

## 2017-05-19 NOTE — Progress Notes (Signed)
Internal Medicine Clinic Attending  Case discussed with Dr. Molt at the time of the visit.  We reviewed the resident's history and exam and pertinent patient test results.  I agree with the assessment, diagnosis, and plan of care documented in the resident's note. 

## 2017-05-19 NOTE — Telephone Encounter (Addendum)
Information was sent to [facility] for PA for Invokana.  Awaiting determination. [place], RN 05/19/2017 10:05 AM. Prior Authorization for Invokana 100 mg approved  05/19/2017 thru 5/10 2020.  Sander Nephew, RN 05/25/2017 10:22 AM.

## 2017-06-20 ENCOUNTER — Encounter (HOSPITAL_COMMUNITY): Payer: Self-pay

## 2017-06-20 ENCOUNTER — Other Ambulatory Visit: Payer: Self-pay

## 2017-06-20 DIAGNOSIS — R748 Abnormal levels of other serum enzymes: Secondary | ICD-10-CM | POA: Diagnosis present

## 2017-06-20 DIAGNOSIS — K581 Irritable bowel syndrome with constipation: Secondary | ICD-10-CM | POA: Diagnosis present

## 2017-06-20 DIAGNOSIS — Z9049 Acquired absence of other specified parts of digestive tract: Secondary | ICD-10-CM

## 2017-06-20 DIAGNOSIS — E781 Pure hyperglyceridemia: Secondary | ICD-10-CM | POA: Diagnosis present

## 2017-06-20 DIAGNOSIS — K5651 Intestinal adhesions [bands], with partial obstruction: Principal | ICD-10-CM | POA: Diagnosis present

## 2017-06-20 DIAGNOSIS — Z90710 Acquired absence of both cervix and uterus: Secondary | ICD-10-CM

## 2017-06-20 DIAGNOSIS — R001 Bradycardia, unspecified: Secondary | ICD-10-CM | POA: Diagnosis present

## 2017-06-20 NOTE — ED Triage Notes (Signed)
Patient arrives with c/o epigastric pain that started today. +N/V, - Diarrhea, last BM 2000. Hx bowel obstruction. No abdominal surgeries otherwise. Abdomen soft, tender to palpation.

## 2017-06-20 NOTE — ED Notes (Signed)
Patient with hx of cholecystectomy per chart.

## 2017-06-21 ENCOUNTER — Inpatient Hospital Stay (HOSPITAL_COMMUNITY)
Admission: EM | Admit: 2017-06-21 | Discharge: 2017-06-23 | DRG: 390 | Disposition: A | Discharge status: Home / Self Care | Payer: Self-pay | Attending: Internal Medicine | Admitting: Internal Medicine

## 2017-06-21 ENCOUNTER — Emergency Department (HOSPITAL_COMMUNITY): Payer: Self-pay

## 2017-06-21 ENCOUNTER — Encounter (HOSPITAL_COMMUNITY): Payer: Self-pay

## 2017-06-21 DIAGNOSIS — K56609 Unspecified intestinal obstruction, unspecified as to partial versus complete obstruction: Secondary | ICD-10-CM

## 2017-06-21 DIAGNOSIS — K56600 Partial intestinal obstruction, unspecified as to cause: Secondary | ICD-10-CM | POA: Diagnosis present

## 2017-06-21 LAB — URINALYSIS, ROUTINE W REFLEX MICROSCOPIC
BILIRUBIN URINE: NEGATIVE
Glucose, UA: NEGATIVE mg/dL
Ketones, ur: 5 mg/dL — AB
LEUKOCYTES UA: NEGATIVE
Nitrite: NEGATIVE
PH: 5 (ref 5.0–8.0)
Protein, ur: NEGATIVE mg/dL
SPECIFIC GRAVITY, URINE: 1.028 (ref 1.005–1.030)

## 2017-06-21 LAB — TROPONIN I: Troponin I: <0.03 ng/mL (ref <0.03)

## 2017-06-21 LAB — COMPREHENSIVE METABOLIC PANEL
ALT: 14 U/L (ref 14–54)
AST: 16 U/L (ref 15–41)
Albumin: 4.4 g/dL (ref 3.5–5.0)
Alkaline Phosphatase: 69 U/L (ref 38–126)
Anion gap: 9 (ref 5–15)
BILIRUBIN TOTAL: 1.5 mg/dL — AB (ref 0.3–1.2)
BUN: 20 mg/dL (ref 6–20)
CO2: 25 mmol/L (ref 22–32)
Calcium: 9.4 mg/dL (ref 8.9–10.3)
Chloride: 110 mmol/L (ref 101–111)
Creatinine, Ser: 0.68 mg/dL (ref 0.44–1.00)
Glucose, Bld: 107 mg/dL — ABNORMAL HIGH (ref 65–99)
POTASSIUM: 3.5 mmol/L (ref 3.5–5.1)
Sodium: 144 mmol/L (ref 135–145)
TOTAL PROTEIN: 7.4 g/dL (ref 6.5–8.1)

## 2017-06-21 LAB — CBC WITH DIFFERENTIAL/PLATELET
BASOS PCT: 0 %
Basophils Absolute: 0 10*3/uL (ref 0.0–0.1)
Eosinophils Absolute: 0.1 10*3/uL (ref 0.0–0.7)
Eosinophils Relative: 1 %
HEMATOCRIT: 36.5 % (ref 36.0–46.0)
Hemoglobin: 12.6 g/dL (ref 12.0–15.0)
LYMPHS PCT: 24 %
Lymphs Abs: 2.2 10*3/uL (ref 0.7–4.0)
MCH: 32.5 pg (ref 26.0–34.0)
MCHC: 34.5 g/dL (ref 30.0–36.0)
MCV: 94.1 fL (ref 78.0–100.0)
MONO ABS: 0.5 10*3/uL (ref 0.1–1.0)
MONOS PCT: 6 %
NEUTROS ABS: 6.4 10*3/uL (ref 1.7–7.7)
Neutrophils Relative %: 69 %
Platelets: 231 10*3/uL (ref 150–400)
RBC: 3.88 MIL/uL (ref 3.87–5.11)
RDW: 13.2 % (ref 11.5–15.5)
WBC: 9.1 10*3/uL (ref 4.0–10.5)

## 2017-06-21 LAB — LIPASE, BLOOD: LIPASE: 19 U/L (ref 11–51)

## 2017-06-21 MED ORDER — IOPAMIDOL (ISOVUE-300) INJECTION 61%
INTRAVENOUS | Status: AC
  Filled 2017-06-21: qty 100

## 2017-06-21 MED ORDER — ONDANSETRON HCL 4 MG PO TABS: 4.0000 mg | ORAL_TABLET | Freq: Four times a day (QID) | ORAL | Status: DC | PRN

## 2017-06-21 MED ORDER — IOPAMIDOL (ISOVUE-300) INJECTION 61%
100.0000 mL | Freq: Once | INTRAVENOUS | Status: AC | PRN
  Administered 2017-06-21: 100 mL via INTRAVENOUS

## 2017-06-21 MED ORDER — SODIUM CHLORIDE 0.9 % IV SOLN: INTRAVENOUS | Status: DC

## 2017-06-21 MED ORDER — SODIUM CHLORIDE 0.9 % IV BOLUS
1000.0000 mL | Freq: Once | INTRAVENOUS | Status: AC
  Administered 2017-06-21: 1000 mL via INTRAVENOUS

## 2017-06-21 MED ORDER — ACETAMINOPHEN 650 MG RE SUPP: 650.0000 mg | Freq: Four times a day (QID) | RECTAL | Status: DC | PRN

## 2017-06-21 MED ORDER — MORPHINE SULFATE (PF) 4 MG/ML IV SOLN
4.0000 mg | INTRAVENOUS | Status: DC | PRN
  Administered 2017-06-21 (×2): 4 mg via INTRAVENOUS
  Filled 2017-06-21 (×2): qty 1

## 2017-06-21 MED ORDER — ALBUTEROL SULFATE (2.5 MG/3ML) 0.083% IN NEBU: 2.5000 mg | INHALATION_SOLUTION | Freq: Four times a day (QID) | RESPIRATORY_TRACT | Status: DC | PRN

## 2017-06-21 MED ORDER — MORPHINE SULFATE (PF) 4 MG/ML IV SOLN
4.0000 mg | Freq: Once | INTRAVENOUS | Status: AC
  Administered 2017-06-21: 4 mg via INTRAVENOUS
  Filled 2017-06-21: qty 1

## 2017-06-21 MED ORDER — FENTANYL CITRATE (PF) 100 MCG/2ML IJ SOLN
INTRAMUSCULAR | Status: AC
  Administered 2017-06-21: 25 ug via INTRAVENOUS
  Filled 2017-06-21: qty 2

## 2017-06-21 MED ORDER — SODIUM CHLORIDE 0.9 % IV SOLN
INTRAVENOUS | Status: DC
  Administered 2017-06-21: 06:00:00 via INTRAVENOUS

## 2017-06-21 MED ORDER — ONDANSETRON HCL 4 MG/2ML IJ SOLN
4.0000 mg | Freq: Four times a day (QID) | INTRAMUSCULAR | Status: DC | PRN
  Administered 2017-06-21: 4 mg via INTRAVENOUS
  Filled 2017-06-21: qty 2

## 2017-06-21 MED ORDER — ACETAMINOPHEN 325 MG PO TABS
650.0000 mg | ORAL_TABLET | Freq: Four times a day (QID) | ORAL | Status: DC | PRN
  Administered 2017-06-22: 650 mg via ORAL
  Filled 2017-06-21: qty 2

## 2017-06-21 MED ORDER — FAMOTIDINE IN NACL 20-0.9 MG/50ML-% IV SOLN
20.0000 mg | Freq: Two times a day (BID) | INTRAVENOUS | Status: DC
  Administered 2017-06-21 (×2): 20 mg via INTRAVENOUS
  Filled 2017-06-21 (×2): qty 50

## 2017-06-21 MED ORDER — FENTANYL CITRATE (PF) 100 MCG/2ML IJ SOLN
25.0000 ug | INTRAMUSCULAR | Status: DC | PRN
  Administered 2017-06-21: 25 ug via INTRAVENOUS

## 2017-06-21 MED ORDER — ONDANSETRON HCL 4 MG/2ML IJ SOLN
4.0000 mg | Freq: Once | INTRAMUSCULAR | Status: AC
  Administered 2017-06-21: 4 mg via INTRAVENOUS
  Filled 2017-06-21: qty 2

## 2017-06-21 MED ORDER — SODIUM CHLORIDE 0.45 % IV SOLN
INTRAVENOUS | Status: DC
  Administered 2017-06-21 – 2017-06-23 (×4): via INTRAVENOUS
  Filled 2017-06-21 (×6): qty 1000

## 2017-06-21 MED ORDER — ENOXAPARIN SODIUM 40 MG/0.4ML ~~LOC~~ SOLN
40.0000 mg | SUBCUTANEOUS | Status: DC
  Administered 2017-06-21 – 2017-06-23 (×3): 40 mg via SUBCUTANEOUS
  Filled 2017-06-21 (×3): qty 0.4

## 2017-06-21 NOTE — ED Provider Notes (Signed)
[facility]-EMERGENCY DEPT Provider Note   CSN: [id number] Arrival date & time: 06/20/17  2115     History   Chief Complaint Chief Complaint  Patient presents with  . Abdominal Pain  . Nausea  . Emesis    HPI Brandi Mendez is a 62 y.o. female.  Patient presents with a 1 day history of diffuse abdominal pain associated with 3 episodes of nonbloody nonbilious emesis.  Denies fever or chills.  Denies any diarrhea.  Last bowel movement was this morning which is normal.  Patient reports pain is progressively worsening and is constant.  She not been able to eat today.  She has had multiple previous abdominal surgeries including cholecystectomy and hysterectomy.  States she had a small bowel obstruction in the past and this feels similar.  Denies any chest pain or shortness of breath.  Denies any pain with urination or blood in the urine.-  The history is provided by the patient.  Abdominal Pain   Associated symptoms include nausea and vomiting. Pertinent negatives include fever, diarrhea, constipation, dysuria, hematuria, headaches, arthralgias and myalgias.  Emesis   Associated symptoms include abdominal pain. Pertinent negatives include no arthralgias, no cough, no diarrhea, no fever, no headaches and no myalgias.    Past Medical History:  Diagnosis Date  . Small bowel obstruction ([facility]) 2013, 2015    Patient Active Problem List   Diagnosis Date Noted  . Rash 01/03/2015  . Abdominal pain, unspecified site 08/16/2012  . Periauricular adenopathy 07/28/2012  . Dyspepsia 06/01/2012  . BREAST PAIN, LEFT 04/16/2009  . Constipation 01/13/2009  . HYPERTRIGLYCERIDEMIA, MILD 12/26/2006  . OBESITY, UNSPECIFIED 12/26/2006    Past Surgical History:  Procedure Laterality Date  . ABDOMINAL HERNIA REPAIR    . [facility]  . CHOLECYSTECTOMY    . Left ankle surgery       OB History   None      Home Medications    Prior to  Admission medications   Medication Sig Start Date End Date Taking? Authorizing Provider  acetaminophen (TYLENOL) 500 MG tablet Take 1,000 mg by mouth every 6 (six) hours as needed for moderate pain.    [provider]  dicyclomine (BENTYL) 20 MG tablet Take 1 tablet (20 mg total) by mouth 2 (two) times daily. As needed for abdominal pain Patient not taking: Reported on 12/06/2015 08/20/15   Ward, Layla MawKristen N, DO  meclizine (ANTIVERT) 25 MG tablet Take 1 tablet (25 mg total) by mouth 3 (three) times daily as needed for dizziness. 07/23/16   Geoffery Lyonselo, Douglas, MD    Family History No family history on file.  Social History Social History   Tobacco Use  . Smoking status: Never Smoker  . Smokeless tobacco: Never Used  Substance Use Topics  . Alcohol use: Yes    Comment: sometimes  . Drug use: No     Allergies   Patient has no known allergies.   Review of Systems Review of Systems  Constitutional: Positive for activity change and appetite change. Negative for fatigue and fever.  HENT: Negative for congestion and rhinorrhea.   Eyes: Negative for visual disturbance.  Respiratory: Negative for cough, chest tightness and shortness of breath.   Cardiovascular: Negative for chest pain.  Gastrointestinal: Positive for abdominal pain, nausea and vomiting. Negative for constipation and diarrhea.  Genitourinary: Negative for dysuria, hematuria, vaginal bleeding and vaginal discharge.  Musculoskeletal: Negative for arthralgias, back pain and myalgias.  Skin:  Negative for rash.  Neurological: Negative for dizziness, weakness and headaches.    all other systems are negative except as noted in the HPI and PMH.    Physical Exam Updated Vital Signs BP 107/89 (BP Location: Right Arm)   Pulse (!) 54   Temp 98.4 F (36.9 C) (Oral)   Resp 16   Ht 5\' 6"  (1.676 m)   Wt 71.6 kg (157 lb 12.8 oz)   SpO2 100%   BMI 25.47 kg/m   Physical Exam  Constitutional: She is oriented to person,  place, and time. She appears well-developed and well-nourished. No distress.  HENT:  Head: Normocephalic and atraumatic.  Mouth/Throat: Oropharynx is clear and moist. No oropharyngeal exudate.  Eyes: Pupils are equal, round, and reactive to light. Conjunctivae and EOM are normal.  Neck: Normal range of motion. Neck supple.  No meningismus.  Cardiovascular: Normal rate, regular rhythm, normal heart sounds and intact distal pulses.  No murmur heard. Pulmonary/Chest: Effort normal and breath sounds normal. No respiratory distress.  Abdominal: Soft. There is tenderness. There is no rebound and no guarding.  Multiple surgical scars.  Abdomen is soft, diffusely tender with slight voluntary guarding.  No rebound.  Musculoskeletal: Normal range of motion. She exhibits no edema or tenderness.  No CVA tenderness  Neurological: She is alert and oriented to person, place, and time. No cranial nerve deficit. She exhibits normal muscle tone. Coordination normal.  No ataxia on finger to nose bilaterally. No pronator drift. 5/5 strength throughout. CN 2-12 intact.Equal grip strength. Sensation intact.   Skin: Skin is warm.  Psychiatric: She has a normal mood and affect. Her behavior is normal.  Nursing note and vitals reviewed.    ED Treatments / Results  Labs (all labs ordered are listed, but only abnormal results are displayed) Labs Reviewed  COMPREHENSIVE METABOLIC PANEL - Abnormal; Notable for the following components:      Result Value   Glucose, Bld 107 (*)    Total Bilirubin 1.5 (*)    All other components within normal limits  URINALYSIS, ROUTINE W REFLEX MICROSCOPIC - Abnormal; Notable for the following components:   APPearance HAZY (*)    Hgb urine dipstick MODERATE (*)    Ketones, ur 5 (*)    Bacteria, UA RARE (*)    All other components within normal limits  CBC WITH DIFFERENTIAL/PLATELET  LIPASE, BLOOD  TROPONIN I    EKG EKG Interpretation  Date/Time:  Tuesday June 21 2017  02:59:12 EDT Ventricular Rate:  51 PR Interval:    QRS Duration: 103 QT Interval:  464 QTC Calculation: 428 R Axis:   88 Text Interpretation:  Sinus rhythm Consider right ventricular hypertrophy No significant change was found Confirmed by Glynn Octave [phone]) on 06/21/2017 3:05:35 AM   Radiology Ct Abdomen Pelvis W Contrast  Result Date: 06/21/2017 CLINICAL DATA:  Epigastric pain, nausea and vomiting for 1 day. Microhematuria. EXAM: CT ABDOMEN AND PELVIS WITH CONTRAST TECHNIQUE: Multidetector CT imaging of the abdomen and pelvis was performed using the standard protocol following bolus administration of intravenous contrast. CONTRAST:  100 mL Isovue 300 COMPARISON:  08/20/2015 FINDINGS: Lower chest: Dependent atelectasis in the lung bases. Hepatobiliary: Surgical absence of the gallbladder. Subcentimeter low-attenuation lesion in the anterior right hepatic lobe is too small to characterize but likely represents a small cyst or hemangioma. Mild extrahepatic bile duct dilatation is likely normal for postoperative physiology. Pancreas: Unremarkable. No pancreatic ductal dilatation or surrounding inflammatory changes. Spleen: Normal in size without focal abnormality.  Adrenals/Urinary Tract: No adrenal gland nodules. Left renal parenchymal cyst. No hydronephrosis or hydroureter. Bladder is unremarkable. Stomach/Bowel: There is mild dilatation of fluid-filled proximal small bowel loops with decompression of distal small bowel. This may represent early or partial small bowel obstruction versus enteritis. Transition zone appears to be in the anterior mid abdomen likely representing adhesions. Postoperative changes are seen in this area. Stomach and colon are not abnormally distended. No inflammatory infiltrations. Appendix is normal. Vascular/Lymphatic: Aortic atherosclerosis. No enlarged abdominal or pelvic lymph nodes. Reproductive: Uterus and bilateral adnexa are unremarkable. Other: No free air or free  fluid in the abdomen. Surgical clips and scarring in the anterior abdominal wall. Residual or recurrent periumbilical and ventral abdominal wall hernias containing fat. No bowel herniation is appreciated. Musculoskeletal: No destructive bone lesions. IMPRESSION: 1. Mildly dilated fluid-filled small bowel suggesting early or partial small bowel obstruction. Transition zone appears to be in the anterior mid abdomen, likely due to adhesions. 2. Postoperative changes in the anterior abdominal wall with small residual or recurrent hernias containing fat. No bowel herniation. 3. Aortic atherosclerosis. Electronically Signed   By: Burman Nieves M.D.   On: 06/21/2017 04:03    Procedures Procedures (including critical care time)  Medications Ordered in ED Medications  ondansetron (ZOFRAN) injection 4 mg (has no administration in time range)  morphine 4 MG/ML injection 4 mg (has no administration in time range)  sodium chloride 0.9 % bolus 1,000 mL (has no administration in time range)     Initial Impression / Assessment and Plan / ED Course  I have reviewed the triage vital signs and the nursing notes.  Pertinent labs & imaging results that were available during my care of the patient were reviewed by me and considered in my medical decision making (see chart for details).    1 day of abdominal pain with nausea and vomiting similar to previous episodes of bowel obstruction.  We will treat symptoms, obtain labs and CT scan  Labs are reassuring.  Normal LFTs and lipase.  CT scan shows early versus partial small bowel obstruction.  Patient has had no vomiting throughout her 7-hour ED stay.  Will hold an NG tube at this time.  Continue bowel rest, IV fluids, symptom control.  Admission discussed with Dr. Katrinka Blazing. Final Clinical Impressions(s) / ED Diagnoses   Final diagnoses:  Partial small bowel obstruction [facility])    ED Discharge Orders    None       Glynn Octave, MD 06/21/17 [phone]

## 2017-06-21 NOTE — ED Notes (Signed)
ED TO INPATIENT HANDOFF REPORT  Name/Age/Gender Brandi Mendez 62 y.o. female  Code Status    Code Status Orders  (From admission, onward)        Start     Ordered   06/21/17 0504  Full code  Continuous     06/21/17 0504    Code Status History    Date Active Date Inactive Code Status Order ID Comments User Context   05/21/2014 0231 05/24/2014 1555 Full Code [id number]  Leone Brand, MD Inpatient   04/24/2011 2059 04/27/2011 1800 Partial Code [id number]  Adonis Huguenin, RN Inpatient      Home/SNF/Other Home  Chief Complaint abdonimal pain   Level of Care/Admitting Diagnosis ED Disposition    ED Disposition Condition [facility] Area: [facility] [[id number]]  Level of Care: Med-Surg [16]  Diagnosis: Partial small bowel obstruction [facility]) [[id number]]  Admitting Physician: Norval Morton [[id number]]  Attending Physician: Norval Morton [[id number]]  PT Class (Do Not Modify): Observation [104]  PT Acc Code (Do Not Modify): Observation [10022]       Medical History Past Medical History:  Diagnosis Date  . Small bowel obstruction ([place]) 2013, 2015    Allergies No Known Allergies  IV Location/Drains/Wounds Patient Lines/Drains/Airways Status   Active Line/Drains/Airways    Name:   Placement date:   Placement time:   Site:   Days:   Peripheral IV 07/22/16 Right Antecubital   07/22/16    2222    Antecubital   334          Labs/Imaging Results for orders placed or performed during the hospital encounter of 06/21/17 (from the past 48 hour(s))  Urinalysis, Routine w reflex microscopic     Status: Abnormal   Collection Time: 06/21/17  1:20 AM  Result Value Ref Range   Color, Urine YELLOW YELLOW   APPearance HAZY (A) CLEAR   Specific Gravity, Urine 1.028 1.005 - 1.030   pH 5.0 5.0 - 8.0   Glucose, UA NEGATIVE NEGATIVE mg/dL   Hgb urine dipstick MODERATE (A) NEGATIVE   Bilirubin Urine NEGATIVE NEGATIVE   Ketones, ur 5 (A) NEGATIVE  mg/dL   Protein, ur NEGATIVE NEGATIVE mg/dL   Nitrite NEGATIVE NEGATIVE   Leukocytes, UA NEGATIVE NEGATIVE   RBC / HPF 11-20 0 - 5 RBC/hpf   WBC, UA 0-5 0 - 5 WBC/hpf   Bacteria, UA RARE (A) NONE SEEN   Squamous Epithelial / LPF 0-5 0 - 5   Mucus PRESENT     Comment: Performed at [facility], [place] [address], [place], [place] [address]  CBC with Differential/Platelet     Status: None   Collection Time: 06/21/17  1:44 AM  Result Value Ref Range   WBC 9.1 4.0 - 10.5 K/uL   RBC 3.88 3.87 - 5.11 MIL/uL   Hemoglobin 12.6 12.0 - 15.0 g/dL   HCT 36.5 36.0 - 46.0 %   MCV 94.1 78.0 - 100.0 fL   MCH 32.5 26.0 - 34.0 pg   MCHC 34.5 30.0 - 36.0 g/dL   RDW 13.2 11.5 - 15.5 %   Platelets 231 150 - 400 K/uL   Neutrophils Relative % 69 %   Neutro Abs 6.4 1.7 - 7.7 K/uL   Lymphocytes Relative 24 %   Lymphs Abs 2.2 0.7 - 4.0 K/uL   Monocytes Relative 6 %   Monocytes Absolute 0.5 0.1 - 1.0 K/uL   Eosinophils Relative 1 %  Eosinophils Absolute 0.1 0.0 - 0.7 K/uL   Basophils Relative 0 %   Basophils Absolute 0.0 0.0 - 0.1 K/uL    Comment: Performed at [facility], [place] [address], [place], [place] [address]  Comprehensive metabolic panel     Status: Abnormal   Collection Time: 06/21/17  1:44 AM  Result Value Ref Range   Sodium 144 135 - 145 mmol/L   Potassium 3.5 3.5 - 5.1 mmol/L   Chloride 110 101 - 111 mmol/L   CO2 25 22 - 32 mmol/L   Glucose, Bld 107 (H) 65 - 99 mg/dL   BUN 20 6 - 20 mg/dL   Creatinine, Ser 0.68 0.44 - 1.00 mg/dL   Calcium 9.4 8.9 - 10.3 mg/dL   Total Protein 7.4 6.5 - 8.1 g/dL   Albumin 4.4 3.5 - 5.0 g/dL   AST 16 15 - 41 U/L   ALT 14 14 - 54 U/L   Alkaline Phosphatase 69 38 - 126 U/L   Total Bilirubin 1.5 (H) 0.3 - 1.2 mg/dL   GFR calc non Af Amer >60 >60 mL/min   GFR calc Af Amer >60 >60 mL/min    Comment: (NOTE) The eGFR has been calculated using the CKD EPI equation. This calculation has not been validated in all  clinical situations. eGFR's persistently <60 mL/min signify possible Chronic Kidney Disease.    Anion gap 9 5 - 15    Comment: Performed at [facility], [place] [address], [place], [place] [address]  Lipase, blood     Status: None   Collection Time: 06/21/17  1:44 AM  Result Value Ref Range   Lipase 19 11 - 51 U/L    Comment: Performed at [facility], [place] [address] [place], [place] [address]  Troponin I     Status: None   Collection Time: 06/21/17  1:44 AM  Result Value Ref Range   Troponin I <0.03 <0.03 ng/mL    Comment: Performed at [facility], [place] [address], [place],  [address]   Ct Abdomen Pelvis W Contrast  Result Date: 06/21/2017 CLINICAL DATA:  Epigastric pain, nausea and vomiting for 1 day. Microhematuria. EXAM: CT ABDOMEN AND PELVIS WITH CONTRAST TECHNIQUE: Multidetector CT imaging of the abdomen and pelvis was performed using the standard protocol following bolus administration of intravenous contrast. CONTRAST:  100 mL Isovue 300 COMPARISON:  08/20/2015 FINDINGS: Lower chest: Dependent atelectasis in the lung bases. Hepatobiliary: Surgical absence of the gallbladder. Subcentimeter low-attenuation lesion in the anterior right hepatic lobe is too small to characterize but likely represents a small cyst or hemangioma. Mild extrahepatic bile duct dilatation is likely normal for postoperative physiology. Pancreas: Unremarkable. No pancreatic ductal dilatation or surrounding inflammatory changes. Spleen: Normal in size without focal abnormality. Adrenals/Urinary Tract: No adrenal gland nodules. Left renal parenchymal cyst. No hydronephrosis or hydroureter. Bladder is unremarkable. Stomach/Bowel: There is mild dilatation of fluid-filled proximal small bowel loops with decompression of distal small bowel. This may represent early or partial small bowel obstruction versus enteritis. Transition zone appears to be in the anterior  mid abdomen likely representing adhesions. Postoperative changes are seen in this area. Stomach and colon are not abnormally distended. No inflammatory infiltrations. Appendix is normal. Vascular/Lymphatic: Aortic atherosclerosis. No enlarged abdominal or pelvic lymph nodes. Reproductive: Uterus and bilateral adnexa are unremarkable. Other: No free air or free fluid in the abdomen. Surgical clips and scarring in the anterior abdominal wall. Residual or recurrent periumbilical and ventral  abdominal wall hernias containing fat. No bowel herniation is appreciated. Musculoskeletal: No destructive bone lesions. IMPRESSION: 1. Mildly dilated fluid-filled small bowel suggesting early or partial small bowel obstruction. Transition zone appears to be in the anterior mid abdomen, likely due to adhesions. 2. Postoperative changes in the anterior abdominal wall with small residual or recurrent hernias containing fat. No bowel herniation. 3. Aortic atherosclerosis. Electronically Signed   By: Lucienne Capers M.D.   On: 06/21/2017 04:03    Pending Labs Unresulted Labs (From admission, onward)   Start     Ordered   06/22/17 0500  CBC  Tomorrow morning,   R     06/21/17 0504   06/22/17 9584  Basic metabolic panel  Tomorrow morning,   R     06/21/17 0504      Vitals/Pain Today's Vitals   06/21/17 0253 06/21/17 0300 06/21/17 0328 06/21/17 0500  BP: 120/64 110/74  105/65  Pulse: (!) 53 (!) 54  (!) 55  Resp: _0 Temp:      TempSrc: Oral     SpO2: 99% 95%  100%  Weight:      Height:      PainSc: 4   4      Isolation Precautions No active isolations  Medications Medications  0.9 %  sodium chloride infusion (has no administration in time range)  enoxaparin (LOVENOX) injection 40 mg (has no administration in time range)  acetaminophen (TYLENOL) tablet 650 mg (has no administration in time range)    Or  acetaminophen (TYLENOL) suppository 650 mg (has no administration in time range)  ondansetron  (ZOFRAN) tablet 4 mg (has no administration in time range)    Or  ondansetron (ZOFRAN) injection 4 mg (has no administration in time range)  albuterol (PROVENTIL) (2.5 MG/3ML) 0.083% nebulizer solution 2.5 mg (has no administration in time range)  ondansetron (ZOFRAN) injection 4 mg (4 mg Intravenous Given 06/21/17 0252)  morphine 4 MG/ML injection 4 mg (4 mg Intravenous Given 06/21/17 0253)  sodium chloride 0.9 % bolus 1,000 mL (0 mLs Intravenous Stopped 06/21/17 0419)  iopamidol (ISOVUE-300) 61 % injection 100 mL (100 mLs Intravenous Contrast Given 06/21/17 0340)  sodium chloride 0.9 % bolus 1,000 mL (1,000 mLs Intravenous New Bag/Given 06/21/17 0419)    Mobility walks

## 2017-06-21 NOTE — Progress Notes (Signed)
[facility] PROGRESS NOTE    Progress Note  Brandi Mendez  NWG:[id number] DOB: [birth year] DOA: 06/21/2017 PCP: Berton Bon, MD     Brief Narrative:   Brandi Mendez is an 62 y.o. female past medical history significant for small bowel obstruction who presents to the ED complaining of abdominal pain nausea and vomiting that started the day prior to admission, she relates her last bowel movement was on 06/20/2017 with CT scan of the abdomen and pelvis was done that showed a partial small bowel obstruction.  Assessment/Plan:   Active Problems:   Partial small bowel obstruction (HCC)  Patient is currently n.p.o. Minimize narcotics. Monitor electrolytes, ambulate , continue IV fluids, monitor strict I's and O's  DVT prophylaxis: lovenox Family Communication:none Disposition Plan/Barrier to D/C: [place] in2-3 days Code Status:     Code Status Orders  (From admission, onward)        Start     Ordered   06/21/17 0504  Full code  Continuous     06/21/17 0504    Code Status History    Date Active Date Inactive Code Status Order ID Comments User Context   05/21/2014 0231 05/24/2014 1555 Full Code [id number]  Nani Ravens, MD Inpatient   04/24/2011 2059 04/27/2011 1800 Partial Code [id number]  Lisabeth Pick, RN Inpatient        IV Access:    Peripheral IV   Procedures and diagnostic studies:   Ct Abdomen Pelvis W Contrast  Result Date: 06/21/2017 CLINICAL DATA:  Epigastric pain, nausea and vomiting for 1 day. Microhematuria. EXAM: CT ABDOMEN AND PELVIS WITH CONTRAST TECHNIQUE: Multidetector CT imaging of the abdomen and pelvis was performed using the standard protocol following bolus administration of intravenous contrast. CONTRAST:  100 mL Isovue 300 COMPARISON:  08/20/2015 FINDINGS: Lower chest: Dependent atelectasis in the lung bases. Hepatobiliary: Surgical absence of the gallbladder. Subcentimeter low-attenuation lesion in the anterior right hepatic lobe  is too small to characterize but likely represents a small cyst or hemangioma. Mild extrahepatic bile duct dilatation is likely normal for postoperative physiology. Pancreas: Unremarkable. No pancreatic ductal dilatation or surrounding inflammatory changes. Spleen: Normal in size without focal abnormality. Adrenals/Urinary Tract: No adrenal gland nodules. Left renal parenchymal cyst. No hydronephrosis or hydroureter. Bladder is unremarkable. Stomach/Bowel: There is mild dilatation of fluid-filled proximal small bowel loops with decompression of distal small bowel. This may represent early or partial small bowel obstruction versus enteritis. Transition zone appears to be in the anterior mid abdomen likely representing adhesions. Postoperative changes are seen in this area. Stomach and colon are not abnormally distended. No inflammatory infiltrations. Appendix is normal. Vascular/Lymphatic: Aortic atherosclerosis. No enlarged abdominal or pelvic lymph nodes. Reproductive: Uterus and bilateral adnexa are unremarkable. Other: No free air or free fluid in the abdomen. Surgical clips and scarring in the anterior abdominal wall. Residual or recurrent periumbilical and ventral abdominal wall hernias containing fat. No bowel herniation is appreciated. Musculoskeletal: No destructive bone lesions. IMPRESSION: 1. Mildly dilated fluid-filled small bowel suggesting early or partial small bowel obstruction. Transition zone appears to be in the anterior mid abdomen, likely due to adhesions. 2. Postoperative changes in the anterior abdominal wall with small residual or recurrent hernias containing fat. No bowel herniation. 3. Aortic atherosclerosis. Electronically Signed   By: Burman Nieves M.D.   On: 06/21/2017 04:03     Medical Consultants:    None.  Anti-Infectives:   None  Subjective:    [name] domino pain  has subsided still anorexic not passing gas or having bowel movements  Objective:    Vitals:     06/21/17 0400 06/21/17 0500 06/21/17 0600 06/21/17 0620  BP: 99/64 105/65 101/68 113/65  Pulse: 67 (!) 55 60 (!) 46  Resp: 15 15 10 18   Temp:    (!) 97.4 F (36.3 C)  TempSrc:    Oral  SpO2: 92% 100% 93% 100%  Weight:      Height:        Intake/Output Summary (Last 24 hours) at 06/21/2017 0656 Last data filed at 06/21/2017 [id number] Gross per 24 hour  Intake 2000 ml  Output -  Net 2000 ml   Filed Weights   06/20/17 2205  Weight: 71.6 kg (157 lb 12.8 oz)    Exam: General exam: In no acute distress. Respiratory system: Good air movement and clear to auscultation. Cardiovascular system: S1 & S2 heard, RRR Gastrointestinal system: Positive bowel movement, soft with some mild diffuse tenderness. Central nervous system: Alert and oriented. No focal neurological deficits. Extremities: No pedal edema. Skin: No rashes, lesions or ulcers Psychiatry: Judgement and insight appear normal. Mood & affect appropriate.    Data Reviewed:    Labs: Basic Metabolic Panel: Recent Labs  Lab 06/21/17 0144  NA 144  K 3.5  CL 110  CO2 25  GLUCOSE 107*  BUN 20  CREATININE 0.68  CALCIUM 9.4   GFR Estimated Creatinine Clearance: 74.8 mL/min (by C-G formula based on SCr of 0.68 mg/dL). Liver Function Tests: Recent Labs  Lab 06/21/17 0144  AST 16  ALT 14  ALKPHOS 69  BILITOT 1.5*  PROT 7.4  ALBUMIN 4.4   Recent Labs  Lab 06/21/17 0144  LIPASE 19   No results for input(s): AMMONIA in the last 168 hours. Coagulation profile No results for input(s): INR, PROTIME in the last 168 hours.  CBC: Recent Labs  Lab 06/21/17 0144  WBC 9.1  NEUTROABS 6.4  HGB 12.6  HCT 36.5  MCV 94.1  PLT 231   Cardiac Enzymes: Recent Labs  Lab 06/21/17 0144  TROPONINI <0.03   BNP (last 3 results) No results for input(s): PROBNP in the last 8760 hours. CBG: No results for input(s): GLUCAP in the last 168 hours. D-Dimer: No results for input(s): DDIMER in the last 72 hours. Hgb  A1c: No results for input(s): HGBA1C in the last 72 hours. Lipid Profile: No results for input(s): CHOL, HDL, LDLCALC, TRIG, CHOLHDL, LDLDIRECT in the last 72 hours. Thyroid function studies: No results for input(s): TSH, T4TOTAL, T3FREE, THYROIDAB in the last 72 hours.  Invalid input(s): FREET3 Anemia work up: No results for input(s): VITAMINB12, FOLATE, FERRITIN, TIBC, IRON, RETICCTPCT in the last 72 hours. Sepsis Labs: Recent Labs  Lab 06/21/17 0144  WBC 9.1   Microbiology No results found for this or any previous visit (from the past 240 hour(s)).   Medications:   . enoxaparin (LOVENOX) injection  40 mg Subcutaneous Q24H   Continuous Infusions: . sodium chloride 100 mL/hr at 06/21/17 0545  . famotidine (PEPCID) IV         LOS: 0 days   Marinda Elkbraham Feliz Ortiz  [facility] Pager [phone]  *Please refer to [email or web address], password [email or web address] to get updated schedule on who will round on this patient, as hospitalists switch teams weekly. If 7PM-7AM, please contact night-coverage at [email or web address], password [email or web address] for any overnight needs.  06/21/2017, 6:56 AM

## 2017-06-21 NOTE — H&P (Addendum)
History and Physical    Brandi Mendez AVW:[id number] DOB: 01-28-1955 DOA: 06/21/2017  Referring MD/NP/PA: Dr. Glynn OctaveStephen Rancour, MD PCP: Berton BonMikell, Asiyah Zahra, MD  Patient coming from: home   Chief Complaint: Abdominal pain  I have personally briefly reviewed patient's [facility]   HPI: Brandi MusselLee N Maxcy is a 62 y.o. female with medical history significant of small bowel obstruction; who presented with complaints of abdominal pain with nausea and vomiting over the last day.  She reports having midline abdominal pain that was" tight likely knot and sharp".  Emesis had been nonbloody and nonbilious in appearance.  Her last bowel movement was sometime yesterday.  Patient reports still being able to pass flatus.  Denies having any significant fevers, chills, cough, shortness of breath, or dysuria.  Previous history of abdominal hysterectomy and cholecystectomy.  Patient reports having previous bowel obstructions before in the past that resolved with conservative measures almost every year.  ED Course: Admission into the emergency department patient was noted to be afebrile, pulse 53-57, and all other vital signs within normal limits.  Labs were unremarkable.  CT scan of the abdomen and pelvis revealed signs of a partial small bowel obstruction.  Review of Systems  Constitutional: Positive for malaise/fatigue. Negative for chills and fever.  HENT: Negative for congestion and ear pain.   Eyes: Negative for pain and discharge.  Respiratory: Negative for cough, shortness of breath and wheezing.   Cardiovascular: Negative for chest pain and leg swelling.  Gastrointestinal: Positive for abdominal pain, nausea and vomiting. Negative for diarrhea.  Genitourinary: Negative for dysuria and frequency.  Musculoskeletal: Negative for falls and myalgias.  Neurological: Negative for seizures and loss of consciousness.  Psychiatric/Behavioral: Negative for substance abuse. The patient is  not nervous/anxious.     Past Medical History:  Diagnosis Date  . Small bowel obstruction (HCC) 2013, 2015    Past Surgical History:  Procedure Laterality Date  . ABDOMINAL HERNIA REPAIR    . [place]  . CHOLECYSTECTOMY    . Left ankle surgery       reports that she has never smoked. She has never used smokeless tobacco. She reports that she drinks alcohol. She reports that she does not use drugs.  No Known Allergies  History reviewed. No pertinent family history.  Prior to Admission medications   Medication Sig Start Date End Date Taking? Authorizing Provider  ibuprofen (ADVIL,MOTRIN) 200 MG tablet Take 400 mg by mouth every 6 (six) hours as needed for moderate pain.   Yes [provider]  dicyclomine (BENTYL) 20 MG tablet Take 1 tablet (20 mg total) by mouth 2 (two) times daily. As needed for abdominal pain Patient not taking: Reported on 12/06/2015 08/20/15   Ward, Layla MawKristen N, DO  meclizine (ANTIVERT) 25 MG tablet Take 1 tablet (25 mg total) by mouth 3 (three) times daily as needed for dizziness. Patient not taking: Reported on 06/21/2017 07/23/16   Geoffery Lyonselo, Douglas, MD    Physical Exam:  Constitutional: Elderly female in NAD, calm, comfortable Vitals:   06/20/17 2207 06/21/17 0122 06/21/17 0253 06/21/17 0300  BP: 129/85 107/89 120/64 110/74  Pulse: (!) 57 (!) 54 (!) 53 (!) 54  Resp: 18 16 11 13   Temp: 98.4 F (36.9 C)     TempSrc: Oral  Oral   SpO2: 100% 100% 99% 95%  Weight:      Height:       Eyes: PERRL, lids  and conjunctivae normal ENMT: Mucous membranes are moist. Posterior pharynx clear of any exudate or lesions.Normal dentition.  Neck: normal, supple, no masses, no thyromegaly Respiratory: clear to auscultation bilaterally, no wheezing, no crackles. Normal respiratory effort. No accessory muscle use.  Cardiovascular: Regular rate and rhythm, no murmurs / rubs / gallops. No extremity edema. 2+ pedal pulses. No carotid bruits.   Abdomen: Mild epigastric tenderness, no masses palpated. No hepatosplenomegaly. Bowel sounds positive.  Musculoskeletal: no clubbing / cyanosis. No joint deformity upper and lower extremities. Good ROM, no contractures. Normal muscle tone.  Skin: no rashes, lesions, ulcers. No induration Neurologic: CN 2-12 grossly intact. Sensation intact, DTR normal. Strength 5/5 in all 4.  Psychiatric: Normal judgment and insight. Alert and oriented x 3. Normal mood.     Labs on Admission: I have personally reviewed following labs and imaging studies  CBC: Recent Labs  Lab 06/21/17 0144  WBC 9.1  NEUTROABS 6.4  HGB 12.6  HCT 36.5  MCV 94.1  PLT 231   Basic Metabolic Panel: Recent Labs  Lab 06/21/17 0144  NA 144  K 3.5  CL 110  CO2 25  GLUCOSE 107*  BUN 20  CREATININE 0.68  CALCIUM 9.4   GFR: Estimated Creatinine Clearance: 74.8 mL/min (by C-G formula based on SCr of 0.68 mg/dL). Liver Function Tests: Recent Labs  Lab 06/21/17 0144  AST 16  ALT 14  ALKPHOS 69  BILITOT 1.5*  PROT 7.4  ALBUMIN 4.4   Recent Labs  Lab 06/21/17 0144  LIPASE 19   No results for input(s): AMMONIA in the last 168 hours. Coagulation Profile: No results for input(s): INR, PROTIME in the last 168 hours. Cardiac Enzymes: Recent Labs  Lab 06/21/17 0144  TROPONINI <0.03   BNP (last 3 results) No results for input(s): PROBNP in the last 8760 hours. HbA1C: No results for input(s): HGBA1C in the last 72 hours. CBG: No results for input(s): GLUCAP in the last 168 hours. Lipid Profile: No results for input(s): CHOL, HDL, LDLCALC, TRIG, CHOLHDL, LDLDIRECT in the last 72 hours. Thyroid Function Tests: No results for input(s): TSH, T4TOTAL, FREET4, T3FREE, THYROIDAB in the last 72 hours. Anemia Panel: No results for input(s): VITAMINB12, FOLATE, FERRITIN, TIBC, IRON, RETICCTPCT in the last 72 hours. Urine analysis:    Component Value Date/Time   COLORURINE YELLOW 06/21/2017 0120    APPEARANCEUR HAZY (A) 06/21/2017 0120   LABSPEC 1.028 06/21/2017 0120   PHURINE 5.0 06/21/2017 0120   GLUCOSEU NEGATIVE 06/21/2017 0120   HGBUR MODERATE (A) 06/21/2017 0120   HGBUR moderate 06/07/2008 1338   BILIRUBINUR NEGATIVE 06/21/2017 0120   BILIRUBINUR NEG 03/07/2014 1519   KETONESUR 5 (A) 06/21/2017 0120   PROTEINUR NEGATIVE 06/21/2017 0120   UROBILINOGEN 1.0 08/03/2014 1550   NITRITE NEGATIVE 06/21/2017 0120   LEUKOCYTESUR NEGATIVE 06/21/2017 0120   Sepsis Labs: No results found for this or any previous visit (from the past 240 hour(s)).   Radiological Exams on Admission: Ct Abdomen Pelvis W Contrast  Result Date: 06/21/2017 CLINICAL DATA:  Epigastric pain, nausea and vomiting for 1 day. Microhematuria. EXAM: CT ABDOMEN AND PELVIS WITH CONTRAST TECHNIQUE: Multidetector CT imaging of the abdomen and pelvis was performed using the standard protocol following bolus administration of intravenous contrast. CONTRAST:  100 mL Isovue 300 COMPARISON:  08/20/2015 FINDINGS: Lower chest: Dependent atelectasis in the lung bases. Hepatobiliary: Surgical absence of the gallbladder. Subcentimeter low-attenuation lesion in the anterior right hepatic lobe is too small to characterize but likely represents a small  cyst or hemangioma. Mild extrahepatic bile duct dilatation is likely normal for postoperative physiology. Pancreas: Unremarkable. No pancreatic ductal dilatation or surrounding inflammatory changes. Spleen: Normal in size without focal abnormality. Adrenals/Urinary Tract: No adrenal gland nodules. Left renal parenchymal cyst. No hydronephrosis or hydroureter. Bladder is unremarkable. Stomach/Bowel: There is mild dilatation of fluid-filled proximal small bowel loops with decompression of distal small bowel. This may represent early or partial small bowel obstruction versus enteritis. Transition zone appears to be in the anterior mid abdomen likely representing adhesions. Postoperative changes are  seen in this area. Stomach and colon are not abnormally distended. No inflammatory infiltrations. Appendix is normal. Vascular/Lymphatic: Aortic atherosclerosis. No enlarged abdominal or pelvic lymph nodes. Reproductive: Uterus and bilateral adnexa are unremarkable. Other: No free air or free fluid in the abdomen. Surgical clips and scarring in the anterior abdominal wall. Residual or recurrent periumbilical and ventral abdominal wall hernias containing fat. No bowel herniation is appreciated. Musculoskeletal: No destructive bone lesions. IMPRESSION: 1. Mildly dilated fluid-filled small bowel suggesting early or partial small bowel obstruction. Transition zone appears to be in the anterior mid abdomen, likely due to adhesions. 2. Postoperative changes in the anterior abdominal wall with small residual or recurrent hernias containing fat. No bowel herniation. 3. Aortic atherosclerosis. Electronically Signed   By: Burman Nieves M.D.   On: 06/21/2017 04:03    EKG: Independently reviewed.  Sinus bradycardia 52 bpm  Assessment/Plan Partial Small bowel obstruction: Acute.  Patient presents with abdominal pain with nausea and vomiting symptoms.  CT scan showing partial small bowel obstruction likely secondary to adhesions. - Admit to a med-surg bed - N.P.O. - Fentanyl IV as needed pain - IV fluids normal saline at 100 mL/h - Antiemetics as needed  DVT prophylaxis: lovenox   Code Status: Full  Family Communication: Family present at bedside Disposition Plan: TBD  Consults called: None  Admission status: Inpatient   Clydie Braun MD [facility] Pager [phone]   If 7PM-7AM, please contact night-coverage [email or web address] Password [facility]  06/21/2017, 4:39 AM

## 2017-06-21 NOTE — Care Management Note (Signed)
Case Management Note  Patient Details  Name: Brandi Mendez MRN: [id number] Date of Birth: March 25, 1955  Subjective/Objective: 62 y/o f admitted w/PSBO. From home. Has pcp. No health insurance-provided w/health insurance info,& community resources-goodrx-discount info.$4 [employer] med list. Patient very appreciative. Financial counselor-medicaid applic.                  Action/Plan:d/c plan home.   Expected Discharge Date:                  Expected Discharge Plan:  Home/Self Care  In-House Referral:     Discharge planning Services  CM Consult  Post Acute Care Choice:    Choice offered to:     DME Arranged:    DME Agency:     HH Arranged:    HH Agency:     Status of Service:  In process, will continue to follow  If discussed at Long Length of Stay Meetings, dates discussed:    Additional Comments:  Lanier ClamMahabir, Analycia Khokhar, RN 06/21/2017, 2:58 PM

## 2017-06-22 ENCOUNTER — Observation Stay (HOSPITAL_COMMUNITY): Payer: Self-pay

## 2017-06-22 LAB — CBC
HEMATOCRIT: 31.7 % — AB (ref 36.0–46.0)
Hemoglobin: 10.5 g/dL — ABNORMAL LOW (ref 12.0–15.0)
MCH: 31.5 pg (ref 26.0–34.0)
MCHC: 33.1 g/dL (ref 30.0–36.0)
MCV: 95.2 fL (ref 78.0–100.0)
Platelets: 188 10*3/uL (ref 150–400)
RBC: 3.33 MIL/uL — ABNORMAL LOW (ref 3.87–5.11)
RDW: 13.3 % (ref 11.5–15.5)
WBC: 4.9 10*3/uL (ref 4.0–10.5)

## 2017-06-22 LAB — BASIC METABOLIC PANEL
ANION GAP: 6 (ref 5–15)
BUN: 10 mg/dL (ref 6–20)
CALCIUM: 8.8 mg/dL — AB (ref 8.9–10.3)
CO2: 25 mmol/L (ref 22–32)
CREATININE: 0.61 mg/dL (ref 0.44–1.00)
Chloride: 112 mmol/L — ABNORMAL HIGH (ref 101–111)
Glucose, Bld: 66 mg/dL (ref 65–99)
Potassium: 3.9 mmol/L (ref 3.5–5.1)
SODIUM: 143 mmol/L (ref 135–145)

## 2017-06-22 MED ORDER — DOCUSATE SODIUM 100 MG PO CAPS
100.0000 mg | ORAL_CAPSULE | Freq: Two times a day (BID) | ORAL | Status: DC
  Administered 2017-06-22 – 2017-06-23 (×3): 100 mg via ORAL
  Filled 2017-06-22 (×3): qty 1

## 2017-06-22 MED ORDER — POLYETHYLENE GLYCOL 3350 17 G PO PACK
17.0000 g | PACK | Freq: Every day | ORAL | Status: DC | PRN
  Administered 2017-06-22: 17 g via ORAL
  Filled 2017-06-22: qty 1

## 2017-06-22 NOTE — Progress Notes (Signed)
[facility] Progress Note  Patient: Brandi Mendez   PCP: Berton BonMikell, Asiyah Zahra, MD DOB: Nov 09, 1955   DOA: 06/21/2017   DOS: 06/22/2017   Date of Service: the patient was seen and examined on 06/22/2017  Subjective: pt has been able to tolerate oral liquids, no BM but passing gas. No fever or chills Has IBS so has constipation, does not use any meds at baseline.   Brief hospital course: Pt. with PMH of multiple abdominal surgeries and recurrent SBO; admitted on 06/21/2017, presented with complaint of nausea and vomiting, was found to have partial SBO. Currently further plan is continue conservative management.  Assessment and Plan: 1. Partial SBO Getting better Xray abdominal this AM was showing resolution of the dilation of Small bowel  Tolerating full liquid Will advance to soft diet Continue IVF Add colace and miralax   2. Elevated bilirubin Now normal  Likely stress response in the setting of normal LFT  3. Constipation IBS Add miralax and colace  4. Sinus bradycardia No offensive medication on list Monitor on tele asymptomatic for now  Outpatient follow up with PCP. Cardiology if becomes symptomatic   Diet: soft diet DVT Prophylaxis: subcutaneous Heparin  Advance goals of care discussion: full code  Family Communication: no family was present at bedside, at the time of interview.   Disposition:  Discharge to home tomorrow.  Consultants: none Procedures: nonoe  Antibiotics: Anti-infectives (From admission, onward)   None       Objective: Physical Exam: Vitals:   06/21/17 1256 06/21/17 2015 06/22/17 0419 06/22/17 1416  BP: 102/62 109/73 113/65 112/62  Pulse: (!) 45 (!) 42 (!) 50 (!) 46  Resp: 17 18 18    Temp: 97.9 F (36.6 C) 97.7 F (36.5 C) 97.8 F (36.6 C) 98.1 F (36.7 C)  TempSrc: Oral Oral  Oral  SpO2: 99% 100% 100% 100%  Weight:      Height:        Intake/Output Summary (Last 24 hours) at 06/22/2017 1615 Last data filed  at 06/22/2017 1537 Gross per 24 hour  Intake 962.5 ml  Output -  Net 962.5 ml   Filed Weights   06/20/17 2205  Weight: 71.6 kg (157 lb 12.8 oz)   General: Alert, Awake and Oriented to Time, Place and Person. Appear in mild distress, affect appropriate Eyes: PERRL, Conjunctiva normal ENT: Oral Mucosa clear moist. Neck: no JVD, no Abnormal Mass Or lumps Cardiovascular: S1 and S2 Present, no Murmur, Peripheral Pulses Present Respiratory: normal respiratory effort, Bilateral Air entry equal and Decreased, no use of accessory muscle, Clear to Auscultation, no Crackles, no wheezes Abdomen: Bowel Sound present, Soft and mild tenderness, no hernia Skin: no redness, no Rash, no induration Extremities: no Pedal edema, no calf tenderness Neurologic: Grossly no focal neuro deficit. Bilaterally Equal motor strength  Data Reviewed: CBC: Recent Labs  Lab 06/21/17 0144 06/22/17 0516  WBC 9.1 4.9  NEUTROABS 6.4  --   HGB 12.6 10.5*  HCT 36.5 31.7*  MCV 94.1 95.2  PLT 231 188   Basic Metabolic Panel: Recent Labs  Lab 06/21/17 0144 06/22/17 0516  NA 144 143  K 3.5 3.9  CL 110 112*  CO2 25 25  GLUCOSE 107* 66  BUN 20 10  CREATININE 0.68 0.61  CALCIUM 9.4 8.8*    Liver Function Tests: Recent Labs  Lab 06/21/17 0144  AST 16  ALT 14  ALKPHOS 69  BILITOT 1.5*  PROT 7.4  ALBUMIN 4.4   Recent Labs  Lab 06/21/17 0144  LIPASE 19   No results for input(s): AMMONIA in the last 168 hours. Coagulation Profile: No results for input(s): INR, PROTIME in the last 168 hours. Cardiac Enzymes: Recent Labs  Lab 06/21/17 0144  TROPONINI <0.03   BNP (last 3 results) No results for input(s): PROBNP in the last 8760 hours. CBG: No results for input(s): GLUCAP in the last 168 hours. Studies: Dg Abd 1 View  Result Date: 06/22/2017 CLINICAL DATA:  Small bowel obstruction EXAM: ABDOMEN - 1 VIEW COMPARISON:  CT abdomen and pelvis June 21, 2017 FINDINGS: There is diffuse stool  throughout much of the colon. There is currently no appreciable bowel dilatation or air-fluid level. No free air. There are surgical clips in the right upper quadrant. Mesh is noted in the mid abdominal region. There are apparent phleboliths in the pelvis. IMPRESSION: There is no longer appreciable bowel dilatation suggesting interval resolution of bowel obstruction. No evident free air. There is diffuse stool throughout colon. There are multiple sites of postoperative change. Electronically Signed   By: Bretta Bang III M.D.   On: 06/22/2017 07:07    Scheduled Meds: . docusate sodium  100 mg Oral BID  . enoxaparin (LOVENOX) injection  40 mg Subcutaneous Q24H   Continuous Infusions: . sodium chloride 0.45 % 1,000 mL with potassium chloride 40 mEq infusion 75 mL/hr at 06/22/17 1556   PRN Meds: acetaminophen **OR** acetaminophen, albuterol, morphine injection, ondansetron **OR** ondansetron (ZOFRAN) IV, polyethylene glycol  Time spent: 35 minutes  Author: Lynden Oxford, MD [facility] Pager: [phone] 06/22/2017 4:15 PM  If 7PM-7AM, please contact night-coverage at [email or web address], password [facility]

## 2017-06-22 NOTE — Progress Notes (Signed)
CSW consulted per patient request. CSW spoke with patient at bedside, patient requested information about orange card. CSW provided patient with application and brochure. CSW inquired if patient had any additional needs, patient reported none. Patient reported that she is ready to return home. CSW signing off, no other needs identified at this time.   Celso SickleKimberly Ridhaan Dreibelbis, [place] Clinical Social Worker [facility] Cell#: [phone]

## 2017-06-22 NOTE — Progress Notes (Signed)
Report was received from the previous nurse and I agree with her assessment of the patient. I will continue to monitor the patient.  

## 2017-06-23 DIAGNOSIS — K56600 Partial intestinal obstruction, unspecified as to cause: Secondary | ICD-10-CM

## 2017-06-23 LAB — CBC
HCT: 33.4 % — ABNORMAL LOW (ref 36.0–46.0)
Hemoglobin: 11.1 g/dL — ABNORMAL LOW (ref 12.0–15.0)
MCH: 31.2 pg (ref 26.0–34.0)
MCHC: 33.2 g/dL (ref 30.0–36.0)
MCV: 93.8 fL (ref 78.0–100.0)
PLATELETS: 216 10*3/uL (ref 150–400)
RBC: 3.56 MIL/uL — ABNORMAL LOW (ref 3.87–5.11)
RDW: 13 % (ref 11.5–15.5)
WBC: 4.8 10*3/uL (ref 4.0–10.5)

## 2017-06-23 LAB — BASIC METABOLIC PANEL
Anion gap: 7 (ref 5–15)
BUN: 9 mg/dL (ref 6–20)
CHLORIDE: 111 mmol/L (ref 101–111)
CO2: 25 mmol/L (ref 22–32)
CREATININE: 0.65 mg/dL (ref 0.44–1.00)
Calcium: 9 mg/dL (ref 8.9–10.3)
GFR calc Af Amer: >60 mL/min (ref >60)
GFR calc non Af Amer: >60 mL/min (ref >60)
Glucose, Bld: 101 mg/dL — ABNORMAL HIGH (ref 65–99)
Potassium: 4.1 mmol/L (ref 3.5–5.1)
Sodium: 143 mmol/L (ref 135–145)

## 2017-06-23 MED ORDER — DOCUSATE SODIUM 100 MG PO CAPS: 100.0000 mg | ORAL_CAPSULE | Freq: Two times a day (BID) | ORAL | 0 refills | Status: DC

## 2017-06-23 MED ORDER — POLYETHYLENE GLYCOL 3350 17 G PO PACK: 17.0000 g | PACK | Freq: Every day | ORAL | 0 refills | Status: DC | PRN

## 2017-06-23 NOTE — Progress Notes (Signed)
Pt discharged from the unit via wheelchair. Discharge instructions were reviewed with the patient and MD note given for work purposes. No questions or concerns at this time.

## 2017-06-23 NOTE — Discharge Instructions (Signed)
Small Bowel Obstruction °A small bowel obstruction means that something is blocking the small bowel. The small bowel is also called the small intestine. It is the long tube that connects the stomach to the colon. An obstruction will stop food and fluids from passing through the small bowel. Treatment depends on what is causing the problem and how bad the problem is. °Follow these instructions at home: °· Get a lot of rest. °· Follow your diet as told by your doctor. You may need to: °? Only drink clear liquids until you start to get better. °? Avoid solid foods as told by your doctor. °· Take over-the-counter and prescription medicines only as told by your doctor. °· Keep all follow-up visits as told by your doctor. This is important. °Contact a doctor if: °· You have a fever. °· You have chills. °Get help right away if: °· You have pain or cramps that get worse. °· You throw up (vomit) blood. °· You have a feeling of being sick to your stomach (nausea) that does not go away. °· You cannot stop throwing up. °· You cannot drink fluids. °· You feel confused. °· You feel dry or thirsty (dehydrated). °· Your belly gets more bloated. °· You feel weak or you pass out (faint). °This information is not intended to replace advice given to you by your health care provider. Make sure you discuss any questions you have with your health care provider. °Document Released: 01/29/2004 Document Revised: 08/18/2015 Document Reviewed: 02/14/2014 °Elsevier Interactive Patient Education © 2018 [employer] ° °

## 2017-06-26 NOTE — Discharge Summary (Signed)
[facility] Discharge Summary   Patient: Brandi Mendez ZOX:[id number]   PCP: Berton Bon, MD DOB: 08-18-1955   Date of admission: 06/21/2017   Date of discharge: 06/23/2017    Discharge Diagnoses:  Active Problems:   Partial small bowel obstruction (HCC)   Admitted From: home Disposition:  home  Recommendations for Outpatient Follow-up:  1. Please follow up with PCP in 1 week   Follow-up Information    Berton Bon, MD. Schedule an appointment as soon as possible for a visit in 1 week(s).   Specialty:  Family Medicine Contact information: [address] [place] [address] [phone]          Diet recommendation: regular diet  Activity: The patient is advised to gradually reintroduce usual activities.  Discharge Condition: good  Code Status: full code  History of present illness: As per the H and P dictated on admission, " Brandi Mendez is a 62 y.o. female with medical history significant of small bowel obstruction; who presented with complaints of abdominal pain with nausea and vomiting over the last day.  She reports having midline abdominal pain that was" tight likely knot and sharp".  Emesis had been nonbloody and nonbilious in appearance.  Her last bowel movement was sometime yesterday.  Patient reports still being able to pass flatus.  Denies having any significant fevers, chills, cough, shortness of breath, or dysuria.  Previous history of abdominal hysterectomy and cholecystectomy.  Patient reports having previous bowel obstructions before in the past that resolved with conservative measures almost every year."  Hospital Course:  Summary of her active problems in the hospital is as following. 1. Partial SBO Treated conservatively, no NG tube Resolved  Xray abdomen this AM was showing resolution of the dilation of Small bowel  Tolerating soft diet Add colace and miralax   2. Elevated bilirubin Now normal  Likely stress response in the  setting of normal LFT   3. Constipation IBS Add miralax and colace  4. Sinus bradycardia No offensive medication on list asymptomatic for now  Outpatient follow up with PCP. Cardiology if becomes symptomatic   All other chronic medical condition were stable during the hospitalization.  Patient was ambulatory without any assistance. On the day of the discharge the patient's vitals were stable, and no other acute medical condition were reported by patient. the patient was felt safe to be discharge at home with family.  Consultants: none Procedures: none  DISCHARGE MEDICATION: Allergies as of 06/23/2017   No Known Allergies     Medication List    TAKE these medications   dicyclomine 20 MG tablet Commonly known as:  BENTYL Take 1 tablet (20 mg total) by mouth 2 (two) times daily. As needed for abdominal pain   docusate sodium 100 MG capsule Commonly known as:  COLACE Take 1 capsule (100 mg total) by mouth 2 (two) times daily.   ibuprofen 200 MG tablet Commonly known as:  ADVIL,MOTRIN Take 400 mg by mouth every 6 (six) hours as needed for moderate pain.   meclizine 25 MG tablet Commonly known as:  ANTIVERT Take 1 tablet (25 mg total) by mouth 3 (three) times daily as needed for dizziness.   polyethylene glycol packet Commonly known as:  MIRALAX / GLYCOLAX Take 17 g by mouth daily as needed for mild constipation.      No Known Allergies Discharge Instructions    Diet - low sodium heart healthy   Complete by:  As directed  Discharge instructions   Complete by:  As directed    It is important that you read following instructions as well as go over your medication list with RN to help you understand your care after this hospitalization.  Discharge Instructions: Please follow-up with PCP in one week  Please request your primary care physician to go over all Hospital Tests and Procedure/Radiological results at the follow up,  Please get all Hospital records sent to  your PCP by signing hospital release before you go home.   Do not take more than prescribed Pain, Sleep and Anxiety Medications. You were cared for by a hospitalist during your hospital stay. If you have any questions about your discharge medications or the care you received while you were in the hospital after you are discharged, you can call the unit and ask to speak with the hospitalist on call if the hospitalist that took care of you is not available.  Once you are discharged, your primary care physician will handle any further medical issues. Please note that NO REFILLS for any discharge medications will be authorized once you are discharged, as it is imperative that you return to your primary care physician (or establish a relationship with a primary care physician if you do not have one) for your aftercare needs so that they can reassess your need for medications and monitor your lab values. You Must read complete instructions/literature along with all the possible adverse reactions/side effects for all the Medicines you take and that have been prescribed to you. Take any new Medicines after you have completely understood and accept all the possible adverse reactions/side effects. Wear Seat belts while driving. If you have smoked or chewed Tobacco in the last 2 yrs please stop smoking and/or stop any Recreational drug use.   Increase activity slowly   Complete by:  As directed      Discharge Exam: Filed Weights   06/20/17 2205  Weight: 71.6 kg (157 lb 12.8 oz)   Vitals:   06/22/17 2119 06/23/17 0500  BP: 116/66 98/63  Pulse: (!) 57 (!) 59  Resp: 16 16  Temp: 97.7 F (36.5 C) 97.9 F (36.6 C)  SpO2: 99% 97%   General: Appear in no distress, no Rash; Oral Mucosa moist. Cardiovascular: S1 and S2 Present, no Murmur, no JVD Respiratory: Bilateral Air entry present and Clear to Auscultation, no Crackles, no wheezes Abdomen: Bowel Sound present, Soft and no tenderness Extremities: no  Pedal edema, no calf tenderness Neurology: Grossly no focal neuro deficit.  The results of significant diagnostics from this hospitalization (including imaging, microbiology, ancillary and laboratory) are listed below for reference.    Significant Diagnostic Studies: Dg Abd 1 View  Result Date: 06/22/2017 CLINICAL DATA:  Small bowel obstruction EXAM: ABDOMEN - 1 VIEW COMPARISON:  CT abdomen and pelvis June 21, 2017 FINDINGS: There is diffuse stool throughout much of the colon. There is currently no appreciable bowel dilatation or air-fluid level. No free air. There are surgical clips in the right upper quadrant. Mesh is noted in the mid abdominal region. There are apparent phleboliths in the pelvis. IMPRESSION: There is no longer appreciable bowel dilatation suggesting interval resolution of bowel obstruction. No evident free air. There is diffuse stool throughout colon. There are multiple sites of postoperative change. Electronically Signed   By: Bretta Bang III M.D.   On: 06/22/2017 07:07   Ct Abdomen Pelvis W Contrast  Result Date: 06/21/2017 CLINICAL DATA:  Epigastric pain, nausea and vomiting for 1  day. Microhematuria. EXAM: CT ABDOMEN AND PELVIS WITH CONTRAST TECHNIQUE: Multidetector CT imaging of the abdomen and pelvis was performed using the standard protocol following bolus administration of intravenous contrast. CONTRAST:  100 mL Isovue 300 COMPARISON:  08/20/2015 FINDINGS: Lower chest: Dependent atelectasis in the lung bases. Hepatobiliary: Surgical absence of the gallbladder. Subcentimeter low-attenuation lesion in the anterior right hepatic lobe is too small to characterize but likely represents a small cyst or hemangioma. Mild extrahepatic bile duct dilatation is likely normal for postoperative physiology. Pancreas: Unremarkable. No pancreatic ductal dilatation or surrounding inflammatory changes. Spleen: Normal in size without focal abnormality. Adrenals/Urinary Tract: No adrenal  gland nodules. Left renal parenchymal cyst. No hydronephrosis or hydroureter. Bladder is unremarkable. Stomach/Bowel: There is mild dilatation of fluid-filled proximal small bowel loops with decompression of distal small bowel. This may represent early or partial small bowel obstruction versus enteritis. Transition zone appears to be in the anterior mid abdomen likely representing adhesions. Postoperative changes are seen in this area. Stomach and colon are not abnormally distended. No inflammatory infiltrations. Appendix is normal. Vascular/Lymphatic: Aortic atherosclerosis. No enlarged abdominal or pelvic lymph nodes. Reproductive: Uterus and bilateral adnexa are unremarkable. Other: No free air or free fluid in the abdomen. Surgical clips and scarring in the anterior abdominal wall. Residual or recurrent periumbilical and ventral abdominal wall hernias containing fat. No bowel herniation is appreciated. Musculoskeletal: No destructive bone lesions. IMPRESSION: 1. Mildly dilated fluid-filled small bowel suggesting early or partial small bowel obstruction. Transition zone appears to be in the anterior mid abdomen, likely due to adhesions. 2. Postoperative changes in the anterior abdominal wall with small residual or recurrent hernias containing fat. No bowel herniation. 3. Aortic atherosclerosis. Electronically Signed   By: Burman NievesWilliam  Stevens M.D.   On: 06/21/2017 04:03    Microbiology: No results found for this or any previous visit (from the past 240 hour(s)).   Labs: CBC: Recent Labs  Lab 06/21/17 0144 06/22/17 0516 06/23/17 0528  WBC 9.1 4.9 4.8  NEUTROABS 6.4  --   --   HGB 12.6 10.5* 11.1*  HCT 36.5 31.7* 33.4*  MCV 94.1 95.2 93.8  PLT 231 188 216   Basic Metabolic Panel: Recent Labs  Lab 06/21/17 0144 06/22/17 0516 06/23/17 0528  NA 144 143 143  K 3.5 3.9 4.1  CL 110 112* 111  CO2 25 25 25   GLUCOSE 107* 66 101*  BUN 20 10 9   CREATININE 0.68 0.61 0.65  CALCIUM 9.4 8.8* 9.0    Liver Function Tests: Recent Labs  Lab 06/21/17 0144  AST 16  ALT 14  ALKPHOS 69  BILITOT 1.5*  PROT 7.4  ALBUMIN 4.4   Recent Labs  Lab 06/21/17 0144  LIPASE 19   No results for input(s): AMMONIA in the last 168 hours. Cardiac Enzymes: Recent Labs  Lab 06/21/17 0144  TROPONINI <0.03   BNP (last 3 results) No results for input(s): BNP in the last 8760 hours. CBG: No results for input(s): GLUCAP in the last 168 hours. Time spent: 35 minutes  Signed:  Lynden OxfordPranav Jameel Quant  [facility] 06/23/2017  , 8:07 AM

## 2017-08-15 ENCOUNTER — Encounter (INDEPENDENT_AMBULATORY_CARE_PROVIDER_SITE_OTHER): Payer: Self-pay

## 2017-08-15 ENCOUNTER — Encounter: Payer: Self-pay | Admitting: Internal Medicine

## 2017-08-15 ENCOUNTER — Ambulatory Visit (INDEPENDENT_AMBULATORY_CARE_PROVIDER_SITE_OTHER): Payer: Medicaid Other | Admitting: Internal Medicine

## 2017-08-15 ENCOUNTER — Other Ambulatory Visit: Payer: Self-pay

## 2017-08-15 VITALS — BP 132/78 | HR 80 | Temp 98.4°F | Ht 67.0 in | Wt 189.7 lb

## 2017-08-15 DIAGNOSIS — E785 Hyperlipidemia, unspecified: Secondary | ICD-10-CM

## 2017-08-15 DIAGNOSIS — E119 Type 2 diabetes mellitus without complications: Secondary | ICD-10-CM | POA: Diagnosis not present

## 2017-08-15 DIAGNOSIS — R011 Cardiac murmur, unspecified: Secondary | ICD-10-CM | POA: Diagnosis not present

## 2017-08-15 DIAGNOSIS — Z7984 Long term (current) use of oral hypoglycemic drugs: Secondary | ICD-10-CM

## 2017-08-15 DIAGNOSIS — I10 Essential (primary) hypertension: Secondary | ICD-10-CM | POA: Diagnosis not present

## 2017-08-15 DIAGNOSIS — R809 Proteinuria, unspecified: Secondary | ICD-10-CM | POA: Diagnosis not present

## 2017-08-15 DIAGNOSIS — E1129 Type 2 diabetes mellitus with other diabetic kidney complication: Secondary | ICD-10-CM | POA: Diagnosis not present

## 2017-08-15 DIAGNOSIS — Z79899 Other long term (current) drug therapy: Secondary | ICD-10-CM | POA: Diagnosis not present

## 2017-08-15 DIAGNOSIS — Q210 Ventricular septal defect: Secondary | ICD-10-CM | POA: Diagnosis not present

## 2017-08-15 DIAGNOSIS — H903 Sensorineural hearing loss, bilateral: Secondary | ICD-10-CM | POA: Diagnosis not present

## 2017-08-15 LAB — POCT GLYCOSYLATED HEMOGLOBIN (HGB A1C): HEMOGLOBIN A1C: 7.6 % — AB (ref 4.0–5.6)

## 2017-08-15 LAB — GLUCOSE, CAPILLARY: GLUCOSE-CAPILLARY: 147 mg/dL — AB (ref 70–99)

## 2017-08-15 MED ORDER — CANAGLIFLOZIN 100 MG PO TABS: 100.0000 mg | ORAL_TABLET | Freq: Every day | ORAL | 1 refills | Status: DC

## 2017-08-15 NOTE — Patient Instructions (Addendum)
It was great seeing you today! I'm glad you are doing well. Thank you for choosing [facility] for your primary care.   Today we talked about 1) Diabetes: Your A1c was 7.6% indicating your diabetes is a little less controlled this visit. Please continue taking Metformin 1000mg  twice daily with meals. You were prescribed a medication called Invokana several months ago but I see it hasn't been filled in awhile. Please check your medication bottles at home to ensure you are taking this. I've sent a prescription for Invokana 100mg  daily to your pharmacy.  2) High blood pressure: Your blood pressure was a little uncontrolled today but you were rushing to your appointment. Please continue taking Diltiazem 120mg  daily, Losartan 100mg  daily and HCTZ 25mg  daily.   Please return to clinic in 3-6 months for follow-up! Of course, I'm happy to see you sooner if needed.

## 2017-08-15 NOTE — Assessment & Plan Note (Addendum)
BP initially 152/87 with pulse 88. She was late to the appointment and was rushing. Improved to goal on repeat check. Reviewed medication bottles which were appropriate. Will continue current regimen of Losartan 100mg , HCTZ 25mg  and Dilt 120mg  daily. Patient reports she does not need refills.

## 2017-08-15 NOTE — Assessment & Plan Note (Addendum)
Patients diabetes has been well-controlled recently with dietary modifications, Metformin 2000 mg daily and Invokana 100 mg daily. Her A1c today is 7.6% and after reviewing her medication bottles and regimen, she is only taking Metformin 1000 mg twice daily. We discussed addition of Invokana 100 mg several months ago and this was last filled 05/2017 however patient reportedly not aware of this Rx.  -Start Invokana 100 mg daily, new Rx sent to pharmacy -Continue Metformin 2000 mg -Encouraged pt to continue good work with diet and exercise - has been walking in the evenings when its cooler outside -RTC 3 months for follow-up

## 2017-08-15 NOTE — Progress Notes (Signed)
   CC: follow-up of diabetes, high blood pressure  HPI:  Janice Massey is a very-pleasant 62 y.o. F with congenital hearing loss, type 2 diabetes, essential HTN, HLD and small membranous VSD who presents today for follow-up of type 2 diabetes and high blood pressure. ASL translator present.   For details regarding today's visit and the status of their chronic medical issues, please refer to the assessment and plan.  Past Medical History:  Diagnosis Date  . Atypical chest pain    myoview 10/29/10 - Post-stress EF 56%. Low risk and negative for ischemia   . Back pain   . Back pain   . 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 Chronic PID   . Chronic PID (chronic pelvic inflammatory disease)   . Deaf    Since age 59  . Diabetes mellitus   . Dizziness   . Dyslipidemia   . Dyspnea   . History of Doppler ultrasound 06/20/09   Normal, no prior studies for comparison.   . Hypertension   . Lightheadedness   . Mute   . Obese   . URI (upper respiratory infection)   . Ventricular septal defect 2004 per echo   small membranous VSD   Review of Systems:   General: Denies fevers, chills, weight loss HEENT: Denies changes in vision, sore throat, cough Cardiac: Denies CP, SOB Abd: Denies abdominal pain, changes in bowels Extremities: Denies weakness or swelling  Physical Exam: General: Alert, in no acute distress. Pleasant and conversant (ASL translator assisted) HEENT: No icterus, injection or ptosis. Facial muscles symmetric.  Cardiac: RRR, systolic murmur Pulmonary: CTA BL with normal WOB on RA.  Abd: Soft, non-tender. +bs Extremities: Warm, perfused. No significant pedal edema.   Vitals:   08/15/17 1345 08/15/17 1402  BP: (!) 152/87 132/78  Pulse: 88 80  Temp: 98.4 F (36.9 C)   TempSrc: Oral   SpO2: 98%   Weight: 189 lb 11.2 oz (86 kg)   Height: 5\' 7"  (1.702 m)    Body mass index is 29.71 kg/m.  Assessment & Plan:    See Encounters Tab for problem based charting.  Patient discussed with Dr. Evette Doffing

## 2017-08-16 ENCOUNTER — Encounter: Payer: Medicaid Other | Admitting: Internal Medicine

## 2017-08-18 ENCOUNTER — Telehealth: Payer: Self-pay | Admitting: Internal Medicine

## 2017-08-18 ENCOUNTER — Encounter: Payer: Self-pay | Admitting: *Deleted

## 2017-08-18 NOTE — Telephone Encounter (Signed)
[facility] patient last seen 03/08/2017.  Records rec'd and given to [facility].

## 2017-08-22 NOTE — Addendum Note (Signed)
Addended by: Lalla Brothers T on: 08/22/2017 01:07 PM   Modules accepted: Level of Service

## 2017-08-22 NOTE — Progress Notes (Signed)
Internal Medicine Clinic Attending  Case discussed with Dr. Molt at the time of the visit.  We reviewed the resident's history and exam and pertinent patient test results.  I agree with the assessment, diagnosis, and plan of care documented in the resident's note. 

## 2017-09-15 ENCOUNTER — Telehealth: Payer: Self-pay | Admitting: Internal Medicine

## 2017-10-17 ENCOUNTER — Encounter: Payer: Self-pay | Admitting: *Deleted

## 2017-10-27 ENCOUNTER — Ambulatory Visit (INDEPENDENT_AMBULATORY_CARE_PROVIDER_SITE_OTHER): Payer: Medicaid Other | Admitting: Internal Medicine

## 2017-10-27 ENCOUNTER — Other Ambulatory Visit: Payer: Self-pay

## 2017-10-27 ENCOUNTER — Encounter: Payer: Self-pay | Admitting: Internal Medicine

## 2017-10-27 VITALS — BP 133/73

## 2017-10-27 DIAGNOSIS — H905 Unspecified sensorineural hearing loss: Secondary | ICD-10-CM

## 2017-10-27 DIAGNOSIS — R809 Proteinuria, unspecified: Secondary | ICD-10-CM

## 2017-10-27 DIAGNOSIS — E119 Type 2 diabetes mellitus without complications: Secondary | ICD-10-CM

## 2017-10-27 DIAGNOSIS — I10 Essential (primary) hypertension: Secondary | ICD-10-CM | POA: Diagnosis not present

## 2017-10-27 DIAGNOSIS — Z79899 Other long term (current) drug therapy: Secondary | ICD-10-CM | POA: Diagnosis not present

## 2017-10-27 DIAGNOSIS — Z23 Encounter for immunization: Secondary | ICD-10-CM | POA: Diagnosis not present

## 2017-10-27 DIAGNOSIS — E1129 Type 2 diabetes mellitus with other diabetic kidney complication: Secondary | ICD-10-CM

## 2017-10-27 DIAGNOSIS — Z7984 Long term (current) use of oral hypoglycemic drugs: Secondary | ICD-10-CM

## 2017-10-27 LAB — POCT GLYCOSYLATED HEMOGLOBIN (HGB A1C): Hemoglobin A1C: 7.7 % — AB (ref 4.0–5.6)

## 2017-10-27 LAB — GLUCOSE, CAPILLARY: Glucose-Capillary: 179 mg/dL — ABNORMAL HIGH (ref 70–99)

## 2017-10-27 MED ORDER — CANAGLIFLOZIN 100 MG PO TABS: 200.0000 mg | ORAL_TABLET | Freq: Every day | ORAL | 3 refills | Status: AC

## 2017-10-27 NOTE — Patient Instructions (Signed)
Janice Massey, It was so nice meeting you! Today we discussed:  1. Blood pressure: it is looking great! We will continue on your current medicines.   2. Diabetes: your A1C is up a little bit. I will increase your Invokana and send in a new prescription. I encourage you to continue with your active lifestyle and making healthy eating choices. I am sure it will be improved by next visit.   We will plan to see you back in 3 months for follow-up!  Take care! Dr. Koleen Distance

## 2017-10-30 ENCOUNTER — Encounter: Payer: Self-pay | Admitting: Internal Medicine

## 2017-10-30 DIAGNOSIS — Z23 Encounter for immunization: Secondary | ICD-10-CM | POA: Insufficient documentation

## 2017-10-30 NOTE — Assessment & Plan Note (Addendum)
Patient's A1C has been mildly increasing at each visit. It is 7.7 today. Current regimen is Invokana 100 mg and Metformin 1000 mg BID. Will increase Invokana to 200 mg to attempt better glycemic control. Encouraged her to continue with diet and lifestyle modifications.  Has eye exam scheduled for next February.  Recent foot exam in 05/2017.

## 2017-10-30 NOTE — Assessment & Plan Note (Signed)
Patient received influenza vaccine at today's visit.  °

## 2017-10-30 NOTE — Progress Notes (Signed)
   CC: DM, HTN  HPI:  Ms.Kamia A Hirt is a 62 y.o. female with PMHx congential hearing loss, type 2 DM, HTN who presents for follow-up on her HTN and DM.   For details regarding today's visit and the status of her chronic medical issues, please refer to the assessment and plan.  Past Medical History:  Diagnosis Date  . Atypical chest pain    myoview 10/29/10 - Post-stress EF 56%. Low risk and negative for ischemia   . Back pain   . Back pain   . 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 Chronic PID   . Chronic PID (chronic pelvic inflammatory disease)   . Deaf    Since age 17  . Diabetes mellitus   . Dizziness   . Dyslipidemia   . Dyspnea   . History of Doppler ultrasound 06/20/09   Normal, no prior studies for comparison.   . Hypertension   . Lightheadedness   . Mute   . Obese   . URI (upper respiratory infection)   . Ventricular septal defect 2004 per echo   small membranous VSD   Review of Systems:   Constitutional: negative for fevers, chills, weight changes Cardio: negative for chest pain, palpitations, shortness of breath, lower extremity swelling Neuro: negative for headaches, lightheadedness, syncope, weakness, numbness/tingling   Physical Exam:  Vitals:   10/27/17 1554  BP: 133/73   General: alert, pleasant female, NAD. Interactive with assistance of translator CV: RRR; no murmurs, rubs or gallops Pulm: normal respiratory effort; lungs CTA bilaterally Extremities: no edema   Assessment & Plan:   See Encounters Tab for problem based charting.  Patient seen with Dr. Rebeca Alert

## 2017-10-30 NOTE — Assessment & Plan Note (Signed)
Blood pressure at goal. Continue Losartan 100 mg, HCTZ 25 mg, Diltiazem 120 mg.

## 2017-10-31 NOTE — Progress Notes (Signed)
Internal Medicine Clinic Attending  I saw and evaluated the patient.  I personally confirmed the key portions of the history and exam documented by Dr. Bloomfield and I reviewed pertinent patient test results.  The assessment, diagnosis, and plan were formulated together and I agree with the documentation in the resident's note.  Alexander Raines, M.D., Ph.D.  

## 2017-11-10 ENCOUNTER — Other Ambulatory Visit: Payer: Self-pay | Admitting: Internal Medicine

## 2017-11-11 ENCOUNTER — Other Ambulatory Visit: Payer: Self-pay | Admitting: *Deleted

## 2017-11-13 MED ORDER — DILTIAZEM HCL ER COATED BEADS 120 MG PO CP24: ORAL_CAPSULE | ORAL | 1 refills | Status: DC

## 2017-11-20 NOTE — Progress Notes (Signed)
11/24/2017 Janice Massey   May 20, 1955  [id number]  Primary Physician Delice Bison, DO Primary Cardiologist: Dr. Johnsie Cancel   Reason for Visit/CC:  VSD  HPI: 62 y.o. history of restrictive VSD. Last TTE October 2018 normal RV size and function no pulmonary HTN.  No history of CAD with normal myovue study without ischemia. EF was 62%. Additional past medical history is also significant for diabetes, hypertension and hyperlipidemia. She is on appropriate medical therapy and reports full daily compliance.   She is a twin.  Granddaughter living with her Has other family in town Sign language interpreter with her today  BS poorly controlled followed by [facility] Last A1c 7.7 Invokana increased on 10/30/17  No cardiac complaints   Current Outpatient Medications  Medication Sig Dispense Refill  . albuterol (PROVENTIL HFA;VENTOLIN HFA) 108 (90 Base) MCG/ACT inhaler Inhale 2 puffs into the lungs every 4 (four) hours as needed for wheezing or shortness of breath (or coughing). 1 Inhaler 0  . aspirin 81 MG chewable tablet Chew 81 mg by mouth daily.    . canagliflozin (INVOKANA) 100 MG TABS tablet Take 2 tablets (200 mg total) by mouth daily before breakfast. 90 tablet 3  . cetirizine (ZYRTEC) 10 MG tablet Take 10 mg by mouth daily.  3  . diltiazem (CARDIZEM CD) 120 MG 24 hr capsule TAKE 1 CAPSULE(120 MG) BY MOUTH DAILY 90 capsule 1  . hydrochlorothiazide (HYDRODIURIL) 25 MG tablet Take 25 mg by mouth daily.  5  . losartan (COZAAR) 100 MG tablet Take 1 tablet (100 mg total) by mouth daily. 90 tablet 2  . metFORMIN (GLUCOPHAGE) 1000 MG tablet Take 1 tablet (1,000 mg total) by mouth 2 (two) times daily. 180 tablet 1   No current facility-administered medications for this visit.     Allergies  Allergen Reactions  . Codeine Itching  . Lisinopril Cough  . Penicillins Itching    Has patient had a PCN reaction causing immediate rash, facial/tongue/throat swelling, SOB or lightheadedness with  hypotension: YES Has patient had a PCN reaction causing severe rash involving mucus membranes or skin necrosis: NO Has patient had a PCN reaction that required hospitalization NO Has patient had a PCN reaction occurring within the last 10 years: NO If all of the above answers are "NO", then may proceed with Cephalosporin use.    Social History   Socioeconomic History  . Marital status: Single    Spouse name: Not on file  . Number of children: 1  . Years of education: Not on file  . Highest education level: Not on file  Occupational History    Employer: UNEMPLOYED  Social Needs  . Financial resource strain: Not on file  . Food insecurity:    Worry: Not on file    Inability: Not on file  . Transportation needs:    Medical: Not on file    Non-medical: Not on file  Tobacco Use  . Smoking status: Never Smoker  . Smokeless tobacco: Never Used  Substance and Sexual Activity  . Alcohol use: No    Alcohol/week: 0.0 standard drinks  . Drug use: No  . Sexual activity: Never    Birth control/protection: Surgical  Lifestyle  . Physical activity:    Days per week: Not on file    Minutes per session: Not on file  . Stress: Not on file  Relationships  . Social connections:    Talks on phone: Not on file    Gets together: Not on  file    Attends religious service: Not on file    Active member of club or organization: Not on file    Attends meetings of clubs or organizations: Not on file    Relationship status: Not on file  . Intimate partner violence:    Fear of current or ex partner: Not on file    Emotionally abused: Not on file    Physically abused: Not on file    Forced sexual activity: Not on file  Other Topics Concern  . Not on file  Social History Narrative  . Not on file     Review of Systems: General: negative for chills, fever, night sweats or weight changes.  Cardiovascular: negative for chest pain, dyspnea on exertion, edema, orthopnea, palpitations, paroxysmal  nocturnal dyspnea or shortness of breath Dermatological: negative for rash Respiratory: negative for cough or wheezing Urologic: negative for hematuria Abdominal: negative for nausea, vomiting, diarrhea, bright red blood per rectum, melena, or hematemesis Neurologic: negative for visual changes, syncope, or dizziness All other systems reviewed and are otherwise negative except as noted above.    Blood pressure 116/76, pulse 84, height 5\' 7"  (1.702 m), weight 185 lb 4 oz (84 kg), SpO2 97 %.   Affect appropriate Overweight black female  HEENT: Deaf  Neck supple with no adenopathy JVP normal no bruits no thyromegaly Lungs clear with no wheezing and good diaphragmatic motion Heart:  S1/S2 no murmur, no rub, gallop or click PMI normal Abdomen: benighn, BS positve, no tenderness, no AAA no bruit.  No HSM or HJR Distal pulses intact with no bruits No edema Neuro non-focal Skin warm and dry No muscular weakness   EKG NSR. Non significant inferolateral Q waves  11/24/17  SR rate 84 inferior lateral Q;s not pathologic  ASSESSMENT AND PLAN:   1. Chest Pain:  Resolved normal myovue 2015 and 2016    2. HTN: followed by PCP. Controlled on HCTZ and Cozaar   3. HLD: on statin.  4. DM: followed by [facility] Invokana increased October 2019 A1c 7.7 discussed low carb diet   5. VSD:  Restrictive with no signs of pulmonary hypertension f/u echo 10/11/16  reviewed And stable with good RV/LV function and no pulmonary hypertension F/U TTE October 2020   Jenkins Rouge

## 2017-11-24 ENCOUNTER — Encounter: Payer: Self-pay | Admitting: Cardiovascular Disease

## 2017-11-24 ENCOUNTER — Ambulatory Visit: Payer: Medicaid Other | Admitting: Cardiovascular Disease

## 2017-11-24 VITALS — BP 116/76 | HR 84 | Ht 67.0 in | Wt 185.2 lb

## 2017-11-24 DIAGNOSIS — I10 Essential (primary) hypertension: Secondary | ICD-10-CM

## 2017-11-24 DIAGNOSIS — Q210 Ventricular septal defect: Secondary | ICD-10-CM | POA: Diagnosis not present

## 2017-11-24 DIAGNOSIS — E785 Hyperlipidemia, unspecified: Secondary | ICD-10-CM | POA: Diagnosis not present

## 2017-11-24 NOTE — Patient Instructions (Addendum)
Medication Instructions:   If you need a refill on your cardiac medications before your next appointment, please call your pharmacy.   Lab work:  If you have labs (blood work) drawn today and your tests are completely normal, you will receive your results only by: Marland Kitchen MyChart Message (if you have MyChart) OR . A paper copy in the mail If you have any lab test that is abnormal or we need to change your treatment, we will call you to review the results.  Testing/Procedures: Your physician has requested that you have an echocardiogram in May. Echocardiography is a painless test that uses sound waves to create images of your heart. It provides your doctor with information about the size and shape of your heart and how well your heart's chambers and valves are working. This procedure takes approximately one hour. There are no restrictions for this procedure.  Follow-Up: At [facility], you and your health needs are our priority.  As part of our continuing mission to provide you with exceptional heart care, we have created designated Provider Care Teams.  These Care Teams include your primary Cardiologist (physician) and Advanced Practice Providers (APPs -  Physician Assistants and Nurse Practitioners) who all work together to provide you with the care you need, when you need it. You will need a follow up appointment in 1 years.  Please call our office 2 months in advance to schedule this appointment.  You may see Jenkins Rouge, MD or one of the following Advanced Practice Providers on your designated Care Team:   Truitt Merle, NP Cecilie Kicks, NP . Kathyrn Drown, NP

## 2017-12-21 ENCOUNTER — Other Ambulatory Visit: Payer: Self-pay | Admitting: *Deleted

## 2017-12-22 MED ORDER — CETIRIZINE HCL 10 MG PO TABS: 10.0000 mg | ORAL_TABLET | Freq: Every day | ORAL | 3 refills | Status: DC

## 2017-12-22 MED ORDER — OMEPRAZOLE 40 MG PO CPDR: 40.0000 mg | DELAYED_RELEASE_CAPSULE | Freq: Every day | ORAL | 3 refills | Status: DC

## 2018-01-11 ENCOUNTER — Telehealth: Payer: Self-pay | Admitting: *Deleted

## 2018-01-11 MED ORDER — ATORVASTATIN CALCIUM 40 MG PO TABS: 40.0000 mg | ORAL_TABLET | Freq: Every day | ORAL | 2 refills | Status: DC

## 2018-01-11 NOTE — Telephone Encounter (Signed)
Thanks, Bonnita Nasuti.  It looks like she should be on statin therapy according to last cardiology note. I did this as a new order. If she has an intolerance, we can either look at trying alternative.

## 2018-01-11 NOTE — Telephone Encounter (Signed)
Refill request for atorvastatin 40mg  from pharm, in med hx looks like 04/08/17 pt was seen in ED by dr Roxanne Mins and stated she was not taking her meds due to severe N&V. It was never added back on, pt has continued med w/ last refill date of 10/05/17 #90. Do you want to refill? If so do as a new order. Pharm is [facility] [address]

## 2018-02-08 ENCOUNTER — Other Ambulatory Visit: Payer: Self-pay | Admitting: *Deleted

## 2018-02-08 DIAGNOSIS — E1122 Type 2 diabetes mellitus with diabetic chronic kidney disease: Secondary | ICD-10-CM

## 2018-02-08 DIAGNOSIS — N183 Chronic kidney disease, stage 3 unspecified: Secondary | ICD-10-CM

## 2018-02-08 MED ORDER — METFORMIN HCL 1000 MG PO TABS: 1000.0000 mg | ORAL_TABLET | Freq: Two times a day (BID) | ORAL | 1 refills | Status: DC

## 2018-02-13 ENCOUNTER — Other Ambulatory Visit: Payer: Self-pay

## 2018-02-13 ENCOUNTER — Ambulatory Visit: Payer: Medicaid Other | Admitting: Internal Medicine

## 2018-02-13 ENCOUNTER — Encounter: Payer: Self-pay | Admitting: Internal Medicine

## 2018-02-13 VITALS — BP 130/69 | HR 85 | Temp 98.5°F | Wt 186.3 lb

## 2018-02-13 DIAGNOSIS — R05 Cough: Secondary | ICD-10-CM

## 2018-02-13 DIAGNOSIS — R0982 Postnasal drip: Secondary | ICD-10-CM | POA: Diagnosis not present

## 2018-02-13 DIAGNOSIS — R809 Proteinuria, unspecified: Secondary | ICD-10-CM | POA: Diagnosis not present

## 2018-02-13 DIAGNOSIS — R0981 Nasal congestion: Secondary | ICD-10-CM

## 2018-02-13 DIAGNOSIS — Z1211 Encounter for screening for malignant neoplasm of colon: Secondary | ICD-10-CM

## 2018-02-13 DIAGNOSIS — E1129 Type 2 diabetes mellitus with other diabetic kidney complication: Secondary | ICD-10-CM

## 2018-02-13 DIAGNOSIS — I10 Essential (primary) hypertension: Secondary | ICD-10-CM

## 2018-02-13 DIAGNOSIS — Z79899 Other long term (current) drug therapy: Secondary | ICD-10-CM | POA: Diagnosis not present

## 2018-02-13 LAB — POCT GLYCOSYLATED HEMOGLOBIN (HGB A1C): Hemoglobin A1C: 7.6 % — AB (ref 4.0–5.6)

## 2018-02-13 LAB — GLUCOSE, CAPILLARY: GLUCOSE-CAPILLARY: 148 mg/dL — AB (ref 70–99)

## 2018-02-13 NOTE — Progress Notes (Signed)
   CC: DM, HTN   HPI:  [name] is a 63 y.o. female who presents for follow-up on DM and HTN.   Patient has been feeling well overall. Today she complains of nasal congestion, post-nasal drip and productive cough for the last 5 days. States she has allergy flares around this time of year with changes in weather.     Past Medical History:  Diagnosis Date  . Atypical chest pain    myoview 10/29/10 - Post-stress EF 56%. Low risk and negative for ischemia   . Back pain   . Back pain   . 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 Chronic PID   . Chronic PID (chronic pelvic inflammatory disease)   . Deaf    Since age 71  . Diabetes mellitus   . Dizziness   . Dyslipidemia   . Dyspnea   . History of Doppler ultrasound 06/20/09   Normal, no prior studies for comparison.   . Hypertension   . Lightheadedness   . Mute   . Obese   . URI (upper respiratory infection)   . Ventricular septal defect 2004 per echo   small membranous VSD   Review of Systems:   General: negative for fevers, chills, headaches HEENT: positive for nasal congestion, sore throat Cardio: negative for chest pain, palpitations Resp: positive for productive cough; negative for shortness of breath GI: negative for abdominal pain, n/v, changes in bowel movements   Physical Exam:  Vitals:   02/13/18 1419  BP: 130/69  Pulse: 85  Temp: 98.5 F (36.9 C)  TempSrc: Oral  SpO2: 99%  Weight: 186 lb 4.8 oz (84.5 kg)   Physical Exam  Constitutional: She is oriented to person, place, and time. She appears well-developed and well-nourished. No distress.  HENT:  Right Ear: Tympanic membrane and external ear normal.  Left Ear: Tympanic membrane and external ear normal.  Mouth/Throat: Oropharynx is clear and moist.  Eyes: Conjunctivae are normal.  Cardiovascular: Normal rate and regular rhythm.  Pulmonary/Chest: Effort normal and breath sounds normal.    Abdominal: Soft. Bowel sounds are normal. There is no abdominal tenderness.  Musculoskeletal: Normal range of motion.        General: No edema.  Neurological: She is alert and oriented to person, place, and time.  Skin: Skin is warm and dry.    Assessment & Plan:   See Encounters Tab for problem based charting.  Patient discussed with Dr. Angelia Mould

## 2018-02-13 NOTE — Patient Instructions (Signed)
Janice Massey, It was a pleasure seeing you!   For your allergy symptoms, I'd encourage you to buy the nasal spray called Flonase to use twice daily. You can also take Mucinex DM to help with your symptoms, along with a daily allergy pill like Claritin, Allegra, or Zyrtec.  I have placed the referral for your colonoscopy screening.   In regards to your Diabetes, Your A1C is 7.6 which is good! I will not make any changes today. I'd encourage you to continue with low carb choices to help control your blood sugars.   I will see you in another 3 months!  Take care, Dr. Koleen Distance

## 2018-02-14 ENCOUNTER — Encounter: Payer: Medicaid Other | Admitting: Internal Medicine

## 2018-02-15 ENCOUNTER — Encounter: Payer: Self-pay | Admitting: Internal Medicine

## 2018-02-15 NOTE — Assessment & Plan Note (Signed)
Blood pressure is at goal on current regimen. No symptoms concerning for orthostatic hypotension at home. Will continue Losartan 100 mg, HCTZ 25 mg, Dilt 120 mg.

## 2018-02-15 NOTE — Assessment & Plan Note (Signed)
Patient due for colonoscopy this year. Have placed referral today.

## 2018-02-15 NOTE — Assessment & Plan Note (Signed)
A1C 7.6 today. Will continue current regimen of Invokana 100 mg and Metformin 1000 mg BID. Of note, plan was to increase Invokana at last visit. She did not understand and therefore stayed with 100 mg dose. Given increased risk of side effects with minimal improvement in A1C will continue her at current 100 mg dose.  She does not have any complications that would warrant an A1C of <7. Have encouraged continued diet modifications. She has eye exam scheduled for this month.  If A1C trends up, will consider adding another agent.

## 2018-02-17 NOTE — Progress Notes (Signed)
Internal Medicine Clinic Attending  Case discussed with Dr. Bloomfield at the time of the visit.  We reviewed the resident's history and exam and pertinent patient test results.  I agree with the assessment, diagnosis, and plan of care documented in the resident's note.  

## 2018-02-27 ENCOUNTER — Emergency Department (HOSPITAL_COMMUNITY)
Admission: EM | Admit: 2018-02-27 | Discharge: 2018-02-27 | Disposition: A | Discharge status: Home / Self Care | Payer: Medicaid Other | Attending: Emergency Medicine | Admitting: Emergency Medicine

## 2018-02-27 ENCOUNTER — Emergency Department (HOSPITAL_COMMUNITY): Payer: Medicaid Other

## 2018-02-27 ENCOUNTER — Encounter (HOSPITAL_COMMUNITY): Payer: Self-pay | Admitting: Emergency Medicine

## 2018-02-27 ENCOUNTER — Other Ambulatory Visit: Payer: Self-pay

## 2018-02-27 DIAGNOSIS — R112 Nausea with vomiting, unspecified: Secondary | ICD-10-CM | POA: Diagnosis not present

## 2018-02-27 DIAGNOSIS — R1031 Right lower quadrant pain: Secondary | ICD-10-CM | POA: Diagnosis not present

## 2018-02-27 DIAGNOSIS — R197 Diarrhea, unspecified: Secondary | ICD-10-CM

## 2018-02-27 DIAGNOSIS — R1110 Vomiting, unspecified: Secondary | ICD-10-CM | POA: Diagnosis not present

## 2018-02-27 DIAGNOSIS — I10 Essential (primary) hypertension: Secondary | ICD-10-CM | POA: Insufficient documentation

## 2018-02-27 DIAGNOSIS — Z7982 Long term (current) use of aspirin: Secondary | ICD-10-CM | POA: Insufficient documentation

## 2018-02-27 DIAGNOSIS — Z79899 Other long term (current) drug therapy: Secondary | ICD-10-CM | POA: Diagnosis not present

## 2018-02-27 DIAGNOSIS — R1032 Left lower quadrant pain: Secondary | ICD-10-CM

## 2018-02-27 DIAGNOSIS — Z7984 Long term (current) use of oral hypoglycemic drugs: Secondary | ICD-10-CM | POA: Insufficient documentation

## 2018-02-27 DIAGNOSIS — E119 Type 2 diabetes mellitus without complications: Secondary | ICD-10-CM | POA: Diagnosis not present

## 2018-02-27 DIAGNOSIS — K5792 Diverticulitis of intestine, part unspecified, without perforation or abscess without bleeding: Secondary | ICD-10-CM | POA: Diagnosis not present

## 2018-02-27 LAB — CBC WITH DIFFERENTIAL/PLATELET
Abs Immature Granulocytes: 0.02 10*3/uL (ref 0.00–0.07)
BASOS ABS: 0 10*3/uL (ref 0.0–0.1)
Basophils Relative: 0 %
Eosinophils Absolute: 0.1 10*3/uL (ref 0.0–0.5)
Eosinophils Relative: 2 %
HCT: 38.5 % (ref 36.0–46.0)
HEMOGLOBIN: 12.1 g/dL (ref 12.0–15.0)
Immature Granulocytes: 0 %
LYMPHS PCT: 39 %
Lymphs Abs: 3 10*3/uL (ref 0.7–4.0)
MCH: 28.2 pg (ref 26.0–34.0)
MCHC: 31.4 g/dL (ref 30.0–36.0)
MCV: 89.7 fL (ref 80.0–100.0)
Monocytes Absolute: 0.6 10*3/uL (ref 0.1–1.0)
Monocytes Relative: 8 %
NEUTROS ABS: 3.9 10*3/uL (ref 1.7–7.7)
Neutrophils Relative %: 51 %
Platelets: 292 10*3/uL (ref 150–400)
RBC: 4.29 MIL/uL (ref 3.87–5.11)
RDW: 12.6 % (ref 11.5–15.5)
WBC: 7.6 10*3/uL (ref 4.0–10.5)
nRBC: 0 % (ref 0.0–0.2)

## 2018-02-27 LAB — COMPREHENSIVE METABOLIC PANEL
ALBUMIN: 3.9 g/dL (ref 3.5–5.0)
ALK PHOS: 58 U/L (ref 38–126)
ALT: 23 U/L (ref 0–44)
ANION GAP: 10 (ref 5–15)
AST: 24 U/L (ref 15–41)
BUN: 14 mg/dL (ref 8–23)
CO2: 27 mmol/L (ref 22–32)
Calcium: 9.2 mg/dL (ref 8.9–10.3)
Chloride: 102 mmol/L (ref 98–111)
Creatinine, Ser: 1.17 mg/dL — ABNORMAL HIGH (ref 0.44–1.00)
GFR calc Af Amer: 58 mL/min — ABNORMAL LOW (ref >60)
GFR calc non Af Amer: 50 mL/min — ABNORMAL LOW (ref >60)
GLUCOSE: 125 mg/dL — AB (ref 70–99)
POTASSIUM: 3.6 mmol/L (ref 3.5–5.1)
Sodium: 139 mmol/L (ref 135–145)
Total Bilirubin: 0.7 mg/dL (ref 0.3–1.2)
Total Protein: 6.9 g/dL (ref 6.5–8.1)

## 2018-02-27 LAB — URINALYSIS, ROUTINE W REFLEX MICROSCOPIC
Bacteria, UA: NONE SEEN
Bilirubin Urine: NEGATIVE
Glucose, UA: >=500 mg/dL — AB
Hgb urine dipstick: NEGATIVE
Ketones, ur: NEGATIVE mg/dL
Nitrite: NEGATIVE
PH: 6 (ref 5.0–8.0)
Protein, ur: NEGATIVE mg/dL
SPECIFIC GRAVITY, URINE: 1.007 (ref 1.005–1.030)

## 2018-02-27 LAB — LIPASE, BLOOD: Lipase: 34 U/L (ref 11–51)

## 2018-02-27 MED ORDER — IOHEXOL 300 MG/ML  SOLN
100.0000 mL | Freq: Once | INTRAMUSCULAR | Status: AC | PRN
  Administered 2018-02-27: 100 mL via INTRAVENOUS

## 2018-02-27 MED ORDER — ONDANSETRON HCL 4 MG/2ML IJ SOLN
4.0000 mg | Freq: Once | INTRAMUSCULAR | Status: AC
Start: 2018-02-27 — End: 2018-02-27
  Administered 2018-02-27: 4 mg via INTRAVENOUS
  Filled 2018-02-27: qty 2

## 2018-02-27 MED ORDER — METRONIDAZOLE 500 MG PO TABS: 500.0000 mg | ORAL_TABLET | Freq: Two times a day (BID) | ORAL | 0 refills | Status: DC

## 2018-02-27 MED ORDER — HYDROCODONE-ACETAMINOPHEN 5-325 MG PO TABS: 1.0000 | ORAL_TABLET | Freq: Four times a day (QID) | ORAL | 0 refills | Status: DC | PRN

## 2018-02-27 MED ORDER — MORPHINE SULFATE (PF) 4 MG/ML IV SOLN
4.0000 mg | Freq: Once | INTRAVENOUS | Status: AC
  Administered 2018-02-27: 4 mg via INTRAVENOUS
  Filled 2018-02-27: qty 1

## 2018-02-27 MED ORDER — CIPROFLOXACIN HCL 500 MG PO TABS: 500.0000 mg | ORAL_TABLET | Freq: Two times a day (BID) | ORAL | 0 refills | Status: DC

## 2018-02-27 MED ORDER — POLYETHYLENE GLYCOL 3350 17 G PO PACK: 17.0000 g | PACK | Freq: Every day | ORAL | 0 refills | Status: DC | PRN

## 2018-02-27 MED ORDER — ONDANSETRON 4 MG PO TBDP: 4.0000 mg | ORAL_TABLET | Freq: Three times a day (TID) | ORAL | 0 refills | Status: DC | PRN

## 2018-02-27 MED ORDER — FENTANYL CITRATE (PF) 100 MCG/2ML IJ SOLN
50.0000 ug | Freq: Once | INTRAMUSCULAR | Status: AC
  Administered 2018-02-27: 50 ug via INTRAVENOUS
  Filled 2018-02-27: qty 2

## 2018-02-27 MED ORDER — SODIUM CHLORIDE 0.9 % IV BOLUS (SEPSIS)
1000.0000 mL | Freq: Once | INTRAVENOUS | Status: AC
  Administered 2018-02-27: 1000 mL via INTRAVENOUS

## 2018-02-27 MED ORDER — SODIUM CHLORIDE 0.9% FLUSH
3.0000 mL | Freq: Once | INTRAVENOUS | Status: AC
  Administered 2018-02-27: 3 mL via INTRAVENOUS

## 2018-02-27 MED ORDER — ONDANSETRON HCL 4 MG/2ML IJ SOLN
4.0000 mg | Freq: Once | INTRAMUSCULAR | Status: AC
  Administered 2018-02-27: 4 mg via INTRAVENOUS
  Filled 2018-02-27: qty 2

## 2018-02-27 NOTE — ED Provider Notes (Signed)
  Physical Exam  BP 136/73   Pulse 91   Temp 98.3 F (36.8 C) (Oral)   Resp 17   SpO2 94%   Physical Exam  ED Course/Procedures   Clinical Course as of Feb 27 802  Mon Feb 27, 2018  0311 Patient not yet in the room.   [SJ]    Clinical Course User Index [SJ] Joy, Shawn C, PA-C    Procedures  MDM  Received patient in signout.  Left lower quadrant pain.  Lab work reassuring, but does have fair amount of tenderness.  CT scan reassuring but difficult getting a good view of sigmoid area.  Will treat empirically for diverticulitis since she continues to have tenderness at that spot.  No rash or shingles has been considered.  Has GI follow-up in around 2 weeks for colonoscopy.  Will discharge home.      Davonna Belling, MD 02/27/18 [phone]

## 2018-02-27 NOTE — ED Notes (Signed)
Discharge instructions (including medications) discussed with and copy provided to patient/caregiver.  Interpreter at bedside to assist with discharge teaching.  Pt verbalizes understanding through interpreter.  Armband removed, pt discharged from ED.

## 2018-02-27 NOTE — ED Notes (Signed)
Taken to ct at this time. 

## 2018-02-27 NOTE — ED Triage Notes (Signed)
C/o RLQ pain and pain across lower back since Wednesday or Thursday.  Also reports nausea, vomiting, and diarrhea.

## 2018-02-27 NOTE — ED Provider Notes (Signed)
TIME SEEN: 3:53 AM  CHIEF COMPLAINT: Abdominal pain, vomiting and diarrhea  HPI: Patient is a 63 year old deaf female with history of diabetes, dyslipidemia, obesity who presents to the emergency department with abdominal pain that started 4 days ago.  States it is worse in the left lower quadrant but radiates throughout her abdomen and back.  It is painful to walk because of discomfort.  She has had nausea, vomiting and diarrhea.  No dysuria, hematuria, vaginal bleeding or discharge.  Status post cholecystectomy and BTL.  Has had similar symptoms once years ago but states this is what it felt like when she had gallstones.  No sick contacts or recent travel.  Reports subjective fevers.  Interpreter used throughout visit.  ROS: See HPI Constitutional: fever  Eyes: no drainage  ENT: no runny nose   Cardiovascular:  no chest pain  Resp: no SOB  GI: Vomiting and diarrhea GU: no dysuria Integumentary: no rash  Allergy: no hives  Musculoskeletal: no leg swelling  Neurological: no slurred speech ROS otherwise negative  PAST MEDICAL HISTORY/PAST SURGICAL HISTORY:  Past Medical History:  Diagnosis Date  . Atypical chest pain    myoview 10/29/10 - Post-stress EF 56%. Low risk and negative for ischemia   . Back pain   . Back pain   . 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 Chronic PID   . Chronic PID (chronic pelvic inflammatory disease)   . Deaf    Since age 68  . Diabetes mellitus   . Dizziness   . Dyslipidemia   . Dyspnea   . History of Doppler ultrasound 06/20/09   Normal, no prior studies for comparison.   . Hypertension   . Lightheadedness   . Mute   . Obese   . URI (upper respiratory infection)   . Ventricular septal defect 2004 per echo   small membranous VSD    MEDICATIONS:  Prior to Admission medications   Medication Sig Start Date End Date Taking? Authorizing Provider  albuterol (PROVENTIL HFA;VENTOLIN HFA) 108 (90  Base) MCG/ACT inhaler Inhale 2 puffs into the lungs every 4 (four) hours as needed for wheezing or shortness of breath (or coughing). 09/10/00   Delora Fuel, MD  aspirin 81 MG chewable tablet Chew 81 mg by mouth daily.    [provider]  atorvastatin (LIPITOR) 40 MG tablet Take 1 tablet (40 mg total) by mouth daily. 01/11/18 04/11/18  Bloomfield, Nila Nephew D, DO  canagliflozin (INVOKANA) 100 MG TABS tablet Take 2 tablets (200 mg total) by mouth daily before breakfast. 10/27/17 04/25/18  Bloomfield, Carley D, DO  cetirizine (ZYRTEC) 10 MG tablet Take 1 tablet (10 mg total) by mouth daily. 12/22/17   Bloomfield, Carley D, DO  diltiazem (CARDIZEM CD) 120 MG 24 hr capsule TAKE 1 CAPSULE(120 MG) BY MOUTH DAILY 11/13/17   Bloomfield, Carley D, DO  hydrochlorothiazide (HYDRODIURIL) 25 MG tablet Take 25 mg by mouth daily. 03/13/17   [provider]  losartan (COZAAR) 100 MG tablet Take 1 tablet (100 mg total) by mouth daily. 05/17/17   Molt, Bethany, DO  metFORMIN (GLUCOPHAGE) 1000 MG tablet Take 1 tablet (1,000 mg total) by mouth 2 (two) times daily. 02/08/18   Bloomfield, Carley D, DO  omeprazole (PRILOSEC) 40 MG capsule Take 1 capsule (40 mg total) by mouth daily. 12/22/17 01/21/18  Modena Nunnery D, DO    ALLERGIES:  Allergies  Allergen Reactions  . Codeine Itching  . Lisinopril  Cough  . Penicillins Itching    Has patient had a PCN reaction causing immediate rash, facial/tongue/throat swelling, SOB or lightheadedness with hypotension: YES Has patient had a PCN reaction causing severe rash involving mucus membranes or skin necrosis: NO Has patient had a PCN reaction that required hospitalization NO Has patient had a PCN reaction occurring within the last 10 years: NO If all of the above answers are "NO", then may proceed with Cephalosporin use.    SOCIAL HISTORY:  Social History   Tobacco Use  . Smoking status: Never Smoker  . Smokeless tobacco: Never Used  Substance Use Topics  .  Alcohol use: No    Alcohol/week: 0.0 standard drinks    FAMILY HISTORY: Family History  Problem Relation Age of Onset  . Cancer Mother        liver  . Heart attack Mother 16  . Hypertension Father   . Diabetes Father   . Heart attack Father   . CAD Father 71  . Diabetes Paternal Aunt   . Diabetes Maternal Grandfather     EXAM: BP (!) 170/87 (BP Location: Right Arm)   Pulse 86   Temp 98 F (36.7 C) (Oral)   Resp 17   SpO2 99%  CONSTITUTIONAL: Alert and oriented and responds appropriately to questions.  Appears uncomfortable but is afebrile, nontoxic HEAD: Normocephalic EYES: Conjunctivae clear, pupils appear equal, EOMI ENT: normal nose; moist mucous membranes NECK: Supple, no meningismus, no nuchal rigidity, no LAD  CARD: RRR; S1 and S2 appreciated; no murmurs, no clicks, no rubs, no gallops RESP: Normal chest excursion without splinting or tachypnea; breath sounds clear and equal bilaterally; no wheezes, no rhonchi, no rales, no hypoxia or respiratory distress, speaking full sentences ABD/GI: Normal bowel sounds; non-distended; soft, diffusely tender throughout the abdomen with intermittent voluntary guarding, seems worse in the left lower quadrant, no rebound BACK:  The back appears normal and is non-tender to palpation, there is no CVA tenderness, no midline spinal tenderness or step-off or deformity, no redness or warmth, no ecchymosis or swelling EXT: Normal ROM in all joints; non-tender to palpation; no edema; normal capillary refill; no cyanosis, no calf tenderness or swelling    SKIN: Normal color for age and race; warm; no rash NEURO: Moves all extremities equally normal sensation diffusely, able to ambulate without difficulty PSYCH: The patient's mood and manner are appropriate. Grooming and personal hygiene are appropriate.  MEDICAL DECISION MAKING: Patient here with abdominal pain, vomiting and diarrhea.  Differential includes diverticulitis, colitis, UTI, kidney  stone, pyelonephritis, gastroenteritis, less likely appendicitis.  Will obtain labs, urine and a CT of the abdomen pelvis.  Will give IV fluids, pain and nausea medicine.  ED PROGRESS: Patient still having pain.  Her labs are reassuring other than mild elevation of her creatinine at 1.17.  She has received a liter of fluid.  Urine pending as well as CT abdomen pelvis.  Will give morphine, Zofran for symptomatic relief.  Does state that she has had a previous kidney stone but states this feels "sharper".   7:25 AM  Pt resting comfortably.  Signed out to Dr. Alvino Chapel who will follow-up on CT of the abdomen pelvis for further disposition.  I reviewed all nursing notes, vitals, pertinent previous records, EKGs, lab and urine results, imaging (as available).    Carmela Piechowski, Delice Bison, DO 02/27/18 [phone]

## 2018-02-27 NOTE — Discharge Instructions (Signed)
The CAT scan was not able to get a great look at your sigmoid colon.  We are treating you as if there is an infection there since the pain seems localized that spot.  Keep an eye out for rash such as shingles.  Follow-up with gastroenterology as planned.

## 2018-03-02 DIAGNOSIS — H401121 Primary open-angle glaucoma, left eye, mild stage: Secondary | ICD-10-CM | POA: Diagnosis not present

## 2018-03-02 DIAGNOSIS — H40051 Ocular hypertension, right eye: Secondary | ICD-10-CM | POA: Diagnosis not present

## 2018-03-02 DIAGNOSIS — H35371 Puckering of macula, right eye: Secondary | ICD-10-CM | POA: Diagnosis not present

## 2018-03-05 ENCOUNTER — Encounter (HOSPITAL_COMMUNITY): Payer: Self-pay | Admitting: Emergency Medicine

## 2018-03-05 ENCOUNTER — Other Ambulatory Visit: Payer: Self-pay

## 2018-03-05 ENCOUNTER — Emergency Department (HOSPITAL_COMMUNITY)
Admission: EM | Admit: 2018-03-05 | Discharge: 2018-03-05 | Disposition: A | Discharge status: Home / Self Care | Payer: Medicaid Other | Attending: Emergency Medicine | Admitting: Emergency Medicine

## 2018-03-05 ENCOUNTER — Emergency Department (HOSPITAL_COMMUNITY): Payer: Medicaid Other

## 2018-03-05 DIAGNOSIS — I712 Thoracic aortic aneurysm, without rupture, unspecified: Secondary | ICD-10-CM

## 2018-03-05 DIAGNOSIS — Z7982 Long term (current) use of aspirin: Secondary | ICD-10-CM | POA: Diagnosis not present

## 2018-03-05 DIAGNOSIS — R112 Nausea with vomiting, unspecified: Secondary | ICD-10-CM | POA: Diagnosis not present

## 2018-03-05 DIAGNOSIS — R1032 Left lower quadrant pain: Secondary | ICD-10-CM | POA: Insufficient documentation

## 2018-03-05 DIAGNOSIS — Z79899 Other long term (current) drug therapy: Secondary | ICD-10-CM | POA: Diagnosis not present

## 2018-03-05 DIAGNOSIS — I10 Essential (primary) hypertension: Secondary | ICD-10-CM | POA: Diagnosis not present

## 2018-03-05 DIAGNOSIS — E119 Type 2 diabetes mellitus without complications: Secondary | ICD-10-CM | POA: Diagnosis not present

## 2018-03-05 DIAGNOSIS — J45909 Unspecified asthma, uncomplicated: Secondary | ICD-10-CM | POA: Diagnosis not present

## 2018-03-05 LAB — LIPASE, BLOOD: Lipase: 34 U/L (ref 11–51)

## 2018-03-05 LAB — COMPREHENSIVE METABOLIC PANEL
ALK PHOS: 72 U/L (ref 38–126)
ALT: 30 U/L (ref 0–44)
AST: 27 U/L (ref 15–41)
Albumin: 4.2 g/dL (ref 3.5–5.0)
Anion gap: 11 (ref 5–15)
BUN: 17 mg/dL (ref 8–23)
CALCIUM: 9.7 mg/dL (ref 8.9–10.3)
CO2: 25 mmol/L (ref 22–32)
Chloride: 103 mmol/L (ref 98–111)
Creatinine, Ser: 1.01 mg/dL — ABNORMAL HIGH (ref 0.44–1.00)
GFR calc non Af Amer: 60 mL/min — ABNORMAL LOW (ref >60)
Glucose, Bld: 140 mg/dL — ABNORMAL HIGH (ref 70–99)
Potassium: 3.2 mmol/L — ABNORMAL LOW (ref 3.5–5.1)
Sodium: 139 mmol/L (ref 135–145)
Total Bilirubin: 1 mg/dL (ref 0.3–1.2)
Total Protein: 7.8 g/dL (ref 6.5–8.1)

## 2018-03-05 LAB — URINALYSIS, ROUTINE W REFLEX MICROSCOPIC
Bilirubin Urine: NEGATIVE
Glucose, UA: >=500 mg/dL — AB
Hgb urine dipstick: NEGATIVE
Ketones, ur: NEGATIVE mg/dL
Nitrite: NEGATIVE
Protein, ur: NEGATIVE mg/dL
Specific Gravity, Urine: 1.013 (ref 1.005–1.030)
pH: 6 (ref 5.0–8.0)

## 2018-03-05 LAB — WET PREP, GENITAL
Clue Cells Wet Prep HPF POC: NONE SEEN
Sperm: NONE SEEN
Trich, Wet Prep: NONE SEEN
Yeast Wet Prep HPF POC: NONE SEEN

## 2018-03-05 LAB — CBC WITH DIFFERENTIAL/PLATELET
Abs Immature Granulocytes: 0.03 10*3/uL (ref 0.00–0.07)
Basophils Absolute: 0.1 10*3/uL (ref 0.0–0.1)
Basophils Relative: 1 %
EOS PCT: 1 %
Eosinophils Absolute: 0.1 10*3/uL (ref 0.0–0.5)
HCT: 41.9 % (ref 36.0–46.0)
Hemoglobin: 13.4 g/dL (ref 12.0–15.0)
Immature Granulocytes: 0 %
Lymphocytes Relative: 25 %
Lymphs Abs: 2.5 10*3/uL (ref 0.7–4.0)
MCH: 28.4 pg (ref 26.0–34.0)
MCHC: 32 g/dL (ref 30.0–36.0)
MCV: 88.8 fL (ref 80.0–100.0)
MONO ABS: 0.6 10*3/uL (ref 0.1–1.0)
Monocytes Relative: 6 %
Neutro Abs: 6.8 10*3/uL (ref 1.7–7.7)
Neutrophils Relative %: 67 %
Platelets: 291 10*3/uL (ref 150–400)
RBC: 4.72 MIL/uL (ref 3.87–5.11)
RDW: 12.9 % (ref 11.5–15.5)
WBC: 10.1 10*3/uL (ref 4.0–10.5)
nRBC: 0 % (ref 0.0–0.2)

## 2018-03-05 LAB — I-STAT TROPONIN, ED: TROPONIN I, POC: 0.01 ng/mL (ref 0.00–0.08)

## 2018-03-05 LAB — POC OCCULT BLOOD, ED: Fecal Occult Bld: NEGATIVE

## 2018-03-05 MED ORDER — HYDROCODONE-ACETAMINOPHEN 5-325 MG PO TABS
1.0000 | ORAL_TABLET | Freq: Once | ORAL | Status: AC
  Administered 2018-03-05: 1 via ORAL
  Filled 2018-03-05: qty 1

## 2018-03-05 MED ORDER — IOHEXOL 300 MG/ML  SOLN
100.0000 mL | Freq: Once | INTRAMUSCULAR | Status: AC | PRN
  Administered 2018-03-05: 100 mL via INTRAVENOUS

## 2018-03-05 MED ORDER — ONDANSETRON HCL 4 MG/2ML IJ SOLN
4.0000 mg | Freq: Once | INTRAMUSCULAR | Status: AC
  Administered 2018-03-05: 4 mg via INTRAVENOUS
  Filled 2018-03-05: qty 2

## 2018-03-05 MED ORDER — IOHEXOL 350 MG/ML SOLN
100.0000 mL | Freq: Once | INTRAVENOUS | Status: AC | PRN
  Administered 2018-03-05: 100 mL via INTRAVENOUS

## 2018-03-05 MED ORDER — SODIUM CHLORIDE 0.9 % IV BOLUS
1000.0000 mL | Freq: Once | INTRAVENOUS | Status: AC
  Administered 2018-03-05: 1000 mL via INTRAVENOUS

## 2018-03-05 MED ORDER — KETOROLAC TROMETHAMINE 30 MG/ML IJ SOLN
30.0000 mg | Freq: Once | INTRAMUSCULAR | Status: AC
  Administered 2018-03-05: 30 mg via INTRAVENOUS
  Filled 2018-03-05: qty 1

## 2018-03-05 MED ORDER — ONDANSETRON 4 MG PO TBDP
4.0000 mg | ORAL_TABLET | Freq: Once | ORAL | Status: AC
  Administered 2018-03-05: 4 mg via ORAL
  Filled 2018-03-05: qty 1

## 2018-03-05 MED ORDER — DICYCLOMINE HCL 20 MG PO TABS: 20.0000 mg | ORAL_TABLET | Freq: Four times a day (QID) | ORAL | 0 refills | Status: DC | PRN

## 2018-03-05 NOTE — ED Notes (Signed)
Pt ambulated to the bathroom with one person assist. Pt walking "gingerly" to the bathroom.

## 2018-03-05 NOTE — ED Triage Notes (Addendum)
Pt reports RLQ pain that radiates, lower abd pain and sob x2 weeks. Reports she has vomited x4. She was seen for similar symptoms on 2/24, she states that she took the medications she was given and is not feeling any better. She denies diarrhea, she is unsure of fevers at home, endorses some bright red blood in her stools-last BM was Friday. Sign language interpreter utilized to obtain triage information.

## 2018-03-05 NOTE — ED Notes (Signed)
ED Provider at bedside. 

## 2018-03-05 NOTE — ED Notes (Signed)
Assisted PA, during pt's pelvic exam.

## 2018-03-05 NOTE — ED Notes (Signed)
Pt discharged using a interpreter for the deaf.  Pt signed discharge instructions and voices understanding.  No questions asked.

## 2018-03-05 NOTE — ED Provider Notes (Signed)
[facility] EMERGENCY DEPARTMENT Provider Note   CSN: [id number] Arrival date & time: 03/05/18  1318    History   Chief Complaint Chief Complaint  Patient presents with  . Abdominal Pain  . Flank Pain  . Shortness of Breath    HPI Janice Massey is a 63 y.o. female with a PMH of Diabetes, Asthma, GERD, HLD, VSD, and PID presenting with constant sharp LLQ abdominal pain that radiates to the left lower back and vomiting onset 2 weeks ago. Translator was used for sign language. Patient states she was evaluated on 02/24 and was prescribed antibiotics for possible diverticulitis without relief. Patient states nothing makes symptoms better and movement makes symptoms worse. Patient reports she has an appointment scheduled with GI on March 9th. Patient denies vaginal bleeding. Patient reports white vaginal discharge and itching. Patient denies sexual activity.  Patient reports dysuria, but denies frequency or urgency. Patient reports red blood in her stool, but denies diarrhea and states last BM was 2 days ago. Patient reports a cholecystectomy and tubal ligation.     HPI  Past Medical History:  Diagnosis Date  . Atypical chest pain    myoview 10/29/10 - Post-stress EF 56%. Low risk and negative for ischemia   . Back pain   . Back pain   . 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 Chronic PID   . Chronic PID (chronic pelvic inflammatory disease)   . Deaf    Since age 72  . Diabetes mellitus   . Dizziness   . Dyslipidemia   . Dyspnea   . History of Doppler ultrasound 06/20/09   Normal, no prior studies for comparison.   . Hypertension   . Lightheadedness   . Mute   . Obese   . URI (upper respiratory infection)   . Ventricular septal defect 2004 per echo   small membranous VSD    Patient Active Problem List   Diagnosis Date Noted  . Need for immunization against influenza 10/30/2017  . 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 VSD (ventricular septal defect), perimembranous 05/29/2013  . Encounter for screening colonoscopy 11/09/2011  . Essential hypertension 08/10/2011  . Hyperlipidemia 10/11/2007  . Hearing loss 11/10/2005  . Asthma 11/10/2005  . GERD 11/10/2005  . Diabetes ([place]) 04/05/2002    Past Surgical History:  Procedure Laterality Date  . CARDIAC CATHETERIZATION  08/23/95  . CHOLECYSTECTOMY  11/04/1993  . COLPOSCOPY    . TUBAL LIGATION  10/05/1978     OB History    Gravida  2   Para  2   Term  2   Preterm      AB      Living  2     SAB      TAB      Ectopic      Multiple      Live Births               Home Medications    Prior to Admission medications   Medication Sig Start Date End Date Taking? Authorizing Provider  albuterol (PROVENTIL HFA;VENTOLIN HFA) 108 (90 Base) MCG/ACT inhaler Inhale 2 puffs into the lungs every 4 (four) hours as needed for wheezing or shortness of breath (or coughing). 4/0/98   Delora Fuel, MD  aspirin 81 MG chewable tablet Chew 81 mg by mouth daily.    [provider]  atorvastatin (LIPITOR) 40 MG tablet  Take 1 tablet (40 mg total) by mouth daily. 01/11/18 04/11/18  Bloomfield, Nila Nephew D, DO  canagliflozin (INVOKANA) 100 MG TABS tablet Take 2 tablets (200 mg total) by mouth daily before breakfast. 10/27/17 04/25/18  Bloomfield, Carley D, DO  cetirizine (ZYRTEC) 10 MG tablet Take 1 tablet (10 mg total) by mouth daily. 12/22/17   Bloomfield, Carley D, DO  ciprofloxacin (CIPRO) 500 MG tablet Take 1 tablet (500 mg total) by mouth 2 (two) times daily. 02/27/18   Davonna Belling, MD  diltiazem (CARDIZEM CD) 120 MG 24 hr capsule TAKE 1 CAPSULE(120 MG) BY MOUTH DAILY 11/13/17   Bloomfield, Carley D, DO  hydrochlorothiazide (HYDRODIURIL) 25 MG tablet Take 25 mg by mouth daily. 03/13/17   [provider]  HYDROcodone-acetaminophen (NORCO/VICODIN) 5-325 MG tablet Take 1-2 tablets by mouth every 6 (six) hours as needed.  02/27/18   Davonna Belling, MD  losartan (COZAAR) 100 MG tablet Take 1 tablet (100 mg total) by mouth daily. 05/17/17   Molt, Bethany, DO  metFORMIN (GLUCOPHAGE) 1000 MG tablet Take 1 tablet (1,000 mg total) by mouth 2 (two) times daily. 02/08/18   Bloomfield, Carley D, DO  metroNIDAZOLE (FLAGYL) 500 MG tablet Take 1 tablet (500 mg total) by mouth 2 (two) times daily. 02/27/18   Davonna Belling, MD  omeprazole (PRILOSEC) 40 MG capsule Take 1 capsule (40 mg total) by mouth daily. 12/22/17 03/04/27  Bloomfield, Nila Nephew D, DO  ondansetron (ZOFRAN-ODT) 4 MG disintegrating tablet Take 1 tablet (4 mg total) by mouth every 8 (eight) hours as needed for nausea or vomiting. 02/27/18   Davonna Belling, MD  polyethylene glycol [facility]) packet Take 17 g by mouth daily as needed for moderate constipation (while taking pain medicine). 02/27/18   Davonna Belling, MD    Family History Family History  Problem Relation Age of Onset  . Cancer Mother        liver  . Heart attack Mother 2  . Hypertension Father   . Diabetes Father   . Heart attack Father   . CAD Father 48  . Diabetes Paternal Aunt   . Diabetes Maternal Grandfather     Social History Social History   Tobacco Use  . Smoking status: Never Smoker  . Smokeless tobacco: Never Used  Substance Use Topics  . Alcohol use: No    Alcohol/week: 0.0 standard drinks  . Drug use: No     Allergies   Codeine; Lisinopril; and Penicillins   Review of Systems Review of Systems  Constitutional: Negative for activity change, appetite change, chills, fever and unexpected weight change.  HENT: Negative for congestion, rhinorrhea and sore throat.   Eyes: Negative for visual disturbance.  Respiratory: Negative for cough and shortness of breath.   Cardiovascular: Negative for chest pain.  Gastrointestinal: Positive for abdominal pain, blood in stool, nausea and vomiting. Negative for constipation and diarrhea.  Endocrine: Negative for polydipsia,  polyphagia and polyuria.  Genitourinary: Positive for dysuria and vaginal discharge. Negative for flank pain, frequency, genital sores, urgency and vaginal bleeding.  Musculoskeletal: Positive for back pain. Negative for gait problem.  Skin: Negative for rash and wound.  Psychiatric/Behavioral: The patient is not nervous/anxious.     Physical Exam Updated Vital Signs BP (!) 145/79   Pulse 84   Temp 98.4 F (36.9 C) (Oral)   Resp 16   SpO2 100%   Physical Exam Vitals signs and nursing note reviewed. Exam conducted with a chaperone present.  Constitutional:      General: She  is in acute distress.     Appearance: She is well-developed. She is not diaphoretic.     Comments: Pt appears uncomfortable and in mild distress due to abdominal pain.   HENT:     Head: Normocephalic and atraumatic.  Neck:     Musculoskeletal: Normal range of motion and neck supple.  Cardiovascular:     Rate and Rhythm: Normal rate and regular rhythm.     Heart sounds: Normal heart sounds. No murmur. No friction rub. No gallop.   Pulmonary:     Effort: Pulmonary effort is normal. No respiratory distress.     Breath sounds: Normal breath sounds. No wheezing or rales.  Abdominal:     General: Abdomen is protuberant. Bowel sounds are normal. There is no distension.     Palpations: Abdomen is soft. Abdomen is not rigid. There is no mass.     Tenderness: There is abdominal tenderness in the left lower quadrant. There is left CVA tenderness and guarding. There is no right CVA tenderness or rebound. Negative signs include McBurney's sign.     Hernia: No hernia is present.  Genitourinary:    Vagina: Normal. No signs of injury. No vaginal discharge.     Cervix: No cervical motion tenderness or discharge.     Uterus: Normal.      Adnexa: Right adnexa normal and left adnexa normal.     Rectum: Normal. Guaiac result negative. No tenderness or external hemorrhoid. Normal anal tone.  Musculoskeletal: Normal range of  motion.  Skin:    General: Skin is warm.     Findings: No rash.  Neurological:     Mental Status: She is alert and oriented to person, place, and time.    ED Treatments / Results  Labs (all labs ordered are listed, but only abnormal results are displayed) Labs Reviewed  WET PREP, GENITAL - Abnormal; Notable for the following components:      Result Value   WBC, Wet Prep HPF POC FEW (*)    All other components within normal limits  COMPREHENSIVE METABOLIC PANEL - Abnormal; Notable for the following components:   Potassium 3.2 (*)    Glucose, Bld 140 (*)    Creatinine, Ser 1.01 (*)    GFR calc non Af Amer 60 (*)    All other components within normal limits  URINALYSIS, ROUTINE W REFLEX MICROSCOPIC - Abnormal; Notable for the following components:   Color, Urine STRAW (*)    APPearance HAZY (*)    Glucose, UA >=500 (*)    Leukocytes,Ua SMALL (*)    Bacteria, UA RARE (*)    All other components within normal limits  URINE CULTURE  CBC WITH DIFFERENTIAL/PLATELET  LIPASE, BLOOD  I-STAT TROPONIN, ED  POC OCCULT BLOOD, ED  GC/CHLAMYDIA PROBE AMP ([place]) NOT AT [facility]    EKG EKG Interpretation  Date/Time:  Sunday March 05 2018 13:33:46 EST Ventricular Rate:  83 PR Interval:    QRS Duration: 107 QT Interval:  389 QTC Calculation: 458 R Axis:   54 Text Interpretation:  Sinus rhythm Anterolateral infarct, age indeterminate No significant change since last tracing Confirmed by Gareth Morgan [phone]) on 03/05/2018 2:31:02 PM   Radiology No results found.  Procedures Procedures (including critical care time)  Medications Ordered in ED Medications  ketorolac (TORADOL) 30 MG/ML injection 30 mg (has no administration in time range)  sodium chloride 0.9 % bolus 1,000 mL (has no administration in time range)  ondansetron (ZOFRAN) injection 4 mg (4  mg Intravenous Given 03/05/18 1401)  iohexol (OMNIPAQUE) 300 MG/ML solution 100 mL (100 mLs Intravenous Contrast Given 03/05/18  1551)  iohexol (OMNIPAQUE) 350 MG/ML injection 100 mL (100 mLs Intravenous Contrast Given 03/05/18 1605)     Initial Impression / Assessment and Plan / ED Course  I have reviewed the triage vital signs and the nursing notes.  Pertinent labs & imaging results that were available during my care of the patient were reviewed by me and considered in my medical decision making (see chart for details).  Clinical Course as of Mar 05 1603  Sun Mar 05, 2018  1601 UA reveals leukocytes and rare bacteria. Will order urine culture.  [place] GuestMarland Kitchen): SMALL [AH]  1601 WBCs are within normal limits.  WBC: 10.1 [AH]  1602 POC occult blood is negative.  POC occult blood, ED Provider will collect [AH]    Clinical Course User Index [AH] Arville Lime, PA-C      Patient presents with abdominal pain and vomiting. Patient's pain and other symptoms adequately managed in emergency department.  Fluid bolus ordered.  Wbcs are within normal limits and patient is afebrile. Rare bacteria and leukocytes noted on UA. Will order urine culture. CT Angio Abdomen and Pelvis pending.    Findings and plan of care discussed with supervising physician Dr. Billy Fischer.   At shift change care was transferred to Margarita Mail, PA-C who will follow pending studies, re-evaluate and determine disposition.    Final Clinical Impressions(s) / ED Diagnoses   Final diagnoses:  Left lower quadrant abdominal pain    ED Discharge Orders    None       Arville Lime, [place] 03/05/18 1630    Gareth Morgan, MD 03/06/18 1423

## 2018-03-05 NOTE — ED Notes (Signed)
Pelvic cart set up at bedside  

## 2018-03-05 NOTE — Discharge Instructions (Addendum)
You were given narcotic and or sedative medications while in the emergency department. Do not drive. Do not use machinery or power tools. Do not sign legal documents. Do not drink alcohol. Do not take sleeping pills. Do not supervise children by yourself. Do not participate in activities that require climbing or being in high places.   You have been seen today for abdominal pain and vomiting. Please read and follow all provided instructions.   1. Medications: usual home medications 2. Treatment: rest, drink plenty of fluids 3. Follow Up: Please follow up with your primary doctor in 2 days for discussion of your diagnoses and further evaluation after today's visit; if you do not have a primary care doctor use the resource guide provided to find one; Please return to the ER for any new or worsening symptoms. Please obtain all of your results from medical records or have your doctors office obtain the results - share them with your doctor - you should be seen at your doctors office. Call today to arrange your follow up.   Take medications as prescribed. Please review all of the medicines and only take them if you do not have an allergy to them. Return to the emergency room for worsening condition or new concerning symptoms. Follow up with your regular doctor. If you don't have a regular doctor use one of the numbers below to establish a primary care doctor.  Please be aware that if you are taking birth control pills, taking other prescriptions, ESPECIALLY ANTIBIOTICS may make the birth control ineffective - if this is the case, either do not engage in sexual activity or use alternative methods of birth control such as condoms until you have finished the medicine and your family doctor says it is OK to restart them. If you are on a blood thinner such as COUMADIN, be aware that any other medicine that you take may cause the coumadin to either work too much, or not enough - you should have your coumadin  level rechecked in next 7 days if this is the case.  ?  It is also a possibility that you have an allergic reaction to any of the medicines that you have been prescribed - Everybody reacts differently to medications and while MOST people have no trouble with most medicines, you may have a reaction such as nausea, vomiting, rash, swelling, shortness of breath. If this is the case, please stop taking the medicine immediately and contact your physician.  ?  You should return to the ER if you develop severe or worsening symptoms.   Emergency Department Resource Guide 1) Find a Doctor and Pay Out of Pocket Although you won't have to find out who is covered by your insurance plan, it is a good idea to ask around and get recommendations. You will then need to call the office and see if the doctor you have chosen will accept you as a new patient and what types of options they offer for patients who are self-pay. Some doctors offer discounts or will set up payment plans for their patients who do not have insurance, but you will need to ask so you aren't surprised when you get to your appointment.  2) Contact Your Local Health Department Not all health departments have doctors that can see patients for sick visits, but many do, so it is worth a call to see if yours does. If you don't know where your local health department is, you can check in your phone book. The  CDC also has a tool to help you locate your state's health department, and many state websites also have listings of all of their local health departments.  3) Find a [facility] If your illness is not likely to be very severe or complicated, you may want to try a walk in clinic. These are popping up all over the country in pharmacies, drugstores, and shopping centers. They're usually staffed by nurse practitioners or physician assistants that have been trained to treat common illnesses and complaints. They're usually fairly quick and inexpensive.  However, if you have serious medical issues or chronic medical problems, these are probably not your best option.  No Primary Care Doctor: Call Health Connect at  [phone] - they can help you locate a primary care doctor that  accepts your insurance, provides certain services, etc. Physician Referral [phone]  Emergency Department Resource Guide 1) Find a Doctor and Pay Out of Pocket Although you won't have to find out who is covered by your insurance plan, it is a good idea to ask around and get recommendations. You will then need to call the office and see if the doctor you have chosen will accept you as a new patient and what types of options they offer for patients who are self-pay. Some doctors offer discounts or will set up payment plans for their patients who do not have insurance, but you will need to ask so you aren't surprised when you get to your appointment.  2) Contact Your Local Health Department Not all health departments have doctors that can see patients for sick visits, but many do, so it is worth a call to see if yours does. If you don't know where your local health department is, you can check in your phone book. The CDC also has a tool to help you locate your state's health department, and many state websites also have listings of all of their local health departments.  3) Find a [facility] If your illness is not likely to be very severe or complicated, you may want to try a walk in clinic. These are popping up all over the country in pharmacies, drugstores, and shopping centers. They're usually staffed by nurse practitioners or physician assistants that have been trained to treat common illnesses and complaints. They're usually fairly quick and inexpensive. However, if you have serious medical issues or chronic medical problems, these are probably not your best option.  No Primary Care Doctor: Call Health Connect at  [phone] - they can help you locate a  primary care doctor that  accepts your insurance, provides certain services, etc. Physician Referral Service- [phone]  Chronic Pain Problems: Organization         Address  Phone   Notes  [facility]  [phone] Patients need to be referred by their primary care doctor.   Medication Assistance: Organization         Address  Phone   Notes  [facility] Medication [facility] [address] [place], [place] [address] [phone] --Must be a resident of [facility] -- Must have NO insurance coverage whatsoever (no Medicaid/ Medicare, etc.) -- The pt. MUST have a primary care doctor that directs their care regularly and follows them in the community   [facility]  [phone]   [employer]  [phone]    Agencies that provide inexpensive medical care: Patent attorney  Notes  [place]  [phone]   [facility]    [phone]   [facility] [place], [place] [address] [phone]   [place] [address] [place] [phone]   [facility]    [phone]   [facility]    [phone]   [place] [address], [place] Phone:  [phone], Fax:  [phone] Hours of Operation:  9 am - 6 pm, M-F.  Also accepts Medicaid/Medicare and self-pay.  [facility] for [place] [address], [address], [place] Phone: [phone], Fax: [phone]. Hours of Operation:  8:30 am - 5:30 pm, M-F.  Also accepts Medicaid and self-pay.  [facility] [address], [place] Phone: [phone]   [place], [place], [place] [phone], Ext. [phone] Mondays & Thursdays: 7-9 AM.  First 15 patients are seen on a first come, first serve basis.    [place]  Providers:  Organization         Address  Phone   Notes  [facility] [address], [place] [phone] Also accepts self-pay patients.  [facility] [address], [place]  [phone]   [employer], [address], [place] [phone]   [facility] [address] [place] [phone]   Lucianne Lei [address], [place]   [phone] Only accepts [place] patients after they have their name applied to their card.   Self-Pay (no insurance) in [facility]:  Organization         Address  Phone   Notes  Sickle Cell Patients, [facility] [place] [phone]   [facility] [place] [phone]   [facility] [place]  [place], [place], [place] [phone]   [facility]/Dr. Osei-Bonsu  [address] [place] or [address], [place] [phone] Phone number for both [place] and [place] locations is the same.  [facility] and [facility] [address] [place] [phone]   [facility] [address], [place] or [address] [phone]   [facility] [address], [place] [phone], phone; [phone], fax Sees patients 1st and 3rd Saturday of every month.  Must not qualify for public or private insurance (i.e. Medicaid, Medicare, [place] Health Choice, Veterans' Benefits)  Household income should be no more than 200% of the poverty level The clinic cannot treat you if you are pregnant or think you are pregnant  Sexually transmitted diseases are not treated at the clinic.

## 2018-03-06 ENCOUNTER — Telehealth: Payer: Self-pay | Admitting: Cardiovascular Disease

## 2018-03-06 DIAGNOSIS — I712 Thoracic aortic aneurysm, without rupture, unspecified: Secondary | ICD-10-CM

## 2018-03-06 LAB — HM DIABETES EYE EXAM

## 2018-03-06 NOTE — Telephone Encounter (Signed)
Called patient back about her call. Per Dr. 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 Patient verbalized understanding. Order placed for CT in one year. Informed patient that someone would call and schedule nearer to time is due.

## 2018-03-06 NOTE — Telephone Encounter (Signed)
Will forward to Dr. Johnsie Cancel to see if referral to [facility] is okay.

## 2018-03-06 NOTE — Telephone Encounter (Signed)
New message   Via sign language service   Patient requesting a referral to [facility]; to monitor  aneurysm which needs yearly imaging.  Per ED discharge note. Patient states she called [facility] directly ans was told Dr Johnsie Cancel needed to ref her.

## 2018-03-06 NOTE — Telephone Encounter (Signed)
Follow up    Patient calling back about referral to [facility]. She states that if she does not answer to leave a vm.

## 2018-03-06 NOTE — Telephone Encounter (Signed)
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

## 2018-03-07 LAB — GC/CHLAMYDIA PROBE AMP (~~LOC~~) NOT AT ARMC
Chlamydia: NEGATIVE
Neisseria Gonorrhea: NEGATIVE

## 2018-03-08 DIAGNOSIS — H3561 Retinal hemorrhage, right eye: Secondary | ICD-10-CM | POA: Diagnosis not present

## 2018-03-08 LAB — HM DIABETES EYE EXAM

## 2018-03-13 DIAGNOSIS — K921 Melena: Secondary | ICD-10-CM | POA: Diagnosis not present

## 2018-03-13 DIAGNOSIS — R112 Nausea with vomiting, unspecified: Secondary | ICD-10-CM | POA: Diagnosis not present

## 2018-03-13 DIAGNOSIS — R109 Unspecified abdominal pain: Secondary | ICD-10-CM | POA: Diagnosis not present

## 2018-03-13 DIAGNOSIS — Z1211 Encounter for screening for malignant neoplasm of colon: Secondary | ICD-10-CM | POA: Diagnosis not present

## 2018-03-13 DIAGNOSIS — Z8639 Personal history of other endocrine, nutritional and metabolic disease: Secondary | ICD-10-CM | POA: Diagnosis not present

## 2018-03-13 DIAGNOSIS — R634 Abnormal weight loss: Secondary | ICD-10-CM | POA: Diagnosis not present

## 2018-03-16 DIAGNOSIS — H3561 Retinal hemorrhage, right eye: Secondary | ICD-10-CM | POA: Diagnosis not present

## 2018-03-17 ENCOUNTER — Other Ambulatory Visit: Payer: Self-pay | Admitting: *Deleted

## 2018-03-19 MED ORDER — HYDROCHLOROTHIAZIDE 25 MG PO TABS: 25.0000 mg | ORAL_TABLET | Freq: Every day | ORAL | 5 refills | Status: DC

## 2018-03-23 ENCOUNTER — Other Ambulatory Visit: Payer: Self-pay | Admitting: Gastroenterology

## 2018-03-23 DIAGNOSIS — Z1231 Encounter for screening mammogram for malignant neoplasm of breast: Secondary | ICD-10-CM

## 2018-04-03 ENCOUNTER — Other Ambulatory Visit: Payer: Self-pay | Admitting: Internal Medicine

## 2018-04-17 ENCOUNTER — Other Ambulatory Visit: Payer: Self-pay | Admitting: Internal Medicine

## 2018-04-17 DIAGNOSIS — I10 Essential (primary) hypertension: Secondary | ICD-10-CM

## 2018-04-17 MED ORDER — ALBUTEROL SULFATE HFA 108 (90 BASE) MCG/ACT IN AERS: 2.0000 | INHALATION_SPRAY | RESPIRATORY_TRACT | 0 refills | Status: DC | PRN

## 2018-04-17 MED ORDER — DILTIAZEM HCL ER COATED BEADS 120 MG PO CP24: ORAL_CAPSULE | ORAL | 1 refills | Status: DC

## 2018-04-17 MED ORDER — LOSARTAN POTASSIUM 100 MG PO TABS: 100.0000 mg | ORAL_TABLET | Freq: Every day | ORAL | 2 refills | Status: DC

## 2018-04-17 MED ORDER — OMEPRAZOLE 40 MG PO CPDR: 40.0000 mg | DELAYED_RELEASE_CAPSULE | Freq: Every day | ORAL | 3 refills | Status: DC

## 2018-04-17 MED ORDER — CETIRIZINE HCL 10 MG PO TABS: 10.0000 mg | ORAL_TABLET | Freq: Every day | ORAL | 3 refills | Status: DC

## 2018-04-17 NOTE — Telephone Encounter (Signed)
Meds reviewed. All meds requiring refill sent to Attending and PCP. All other meds have refills in date. Hubbard Hartshorn, RN, BSN

## 2018-04-17 NOTE — Telephone Encounter (Signed)
Pt needs refill on all medicine per [facility] due to expiring; pt contact [phone]

## 2018-05-08 ENCOUNTER — Ambulatory Visit: Payer: Medicaid Other

## 2018-05-16 ENCOUNTER — Other Ambulatory Visit: Payer: Self-pay | Admitting: Internal Medicine

## 2018-05-16 DIAGNOSIS — R809 Proteinuria, unspecified: Secondary | ICD-10-CM

## 2018-05-16 DIAGNOSIS — E1129 Type 2 diabetes mellitus with other diabetic kidney complication: Secondary | ICD-10-CM

## 2018-05-22 ENCOUNTER — Encounter: Payer: Self-pay | Admitting: Internal Medicine

## 2018-05-22 ENCOUNTER — Ambulatory Visit (INDEPENDENT_AMBULATORY_CARE_PROVIDER_SITE_OTHER): Payer: Medicaid Other | Admitting: Internal Medicine

## 2018-05-22 ENCOUNTER — Other Ambulatory Visit: Payer: Self-pay

## 2018-05-22 VITALS — BP 138/71 | HR 83 | Temp 98.8°F | Ht 67.0 in | Wt 186.5 lb

## 2018-05-22 DIAGNOSIS — R809 Proteinuria, unspecified: Secondary | ICD-10-CM | POA: Diagnosis not present

## 2018-05-22 DIAGNOSIS — Z7984 Long term (current) use of oral hypoglycemic drugs: Secondary | ICD-10-CM

## 2018-05-22 DIAGNOSIS — I10 Essential (primary) hypertension: Secondary | ICD-10-CM

## 2018-05-22 DIAGNOSIS — E119 Type 2 diabetes mellitus without complications: Secondary | ICD-10-CM | POA: Diagnosis not present

## 2018-05-22 DIAGNOSIS — E1129 Type 2 diabetes mellitus with other diabetic kidney complication: Secondary | ICD-10-CM | POA: Diagnosis not present

## 2018-05-22 DIAGNOSIS — J45909 Unspecified asthma, uncomplicated: Secondary | ICD-10-CM

## 2018-05-22 DIAGNOSIS — Z79899 Other long term (current) drug therapy: Secondary | ICD-10-CM | POA: Diagnosis not present

## 2018-05-22 LAB — POCT GLYCOSYLATED HEMOGLOBIN (HGB A1C): Hemoglobin A1C: 7.4 % — AB (ref 4.0–5.6)

## 2018-05-22 LAB — GLUCOSE, CAPILLARY: Glucose-Capillary: 155 mg/dL — ABNORMAL HIGH (ref 70–99)

## 2018-05-22 NOTE — Patient Instructions (Signed)
Thank you for visiting clinic today. I am glad you are doing well, keep up the good work. Watch your diet and exercise regularly-walking around the neighborhood or in the park is always better. Continue current medications and follow-up with your PCP in 41-month.

## 2018-05-22 NOTE — Assessment & Plan Note (Signed)
Her A1c today was 7.4, some improvement from previously checked 7.6. She is compliant with Invokana and metformin. She is slowly moving towards goal of below 7.  -Continue metformin and Invokana. Can combined both pills if patient agrees during next follow-up visit.

## 2018-05-22 NOTE — Progress Notes (Signed)
   CC: For follow-up of her diabetes and hypertension.  HPI:  Ms.Catalaya A Easton is a 63 y.o. with past medical history as listed below came to the clinic for follow-up of her diabetes and hypertension.  Communication and history was done through interpreter for sign language.  Patient has no complaint today.  Please see assessment and plan for her chronic conditions.  Past Medical History:  Diagnosis Date  . Atypical chest pain    myoview 10/29/10 - Post-stress EF 56%. Low risk and negative for ischemia   . Back pain   . Back pain   . 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 Chronic PID   . Chronic PID (chronic pelvic inflammatory disease)   . Deaf    Since age 58  . Diabetes mellitus   . Dizziness   . Dyslipidemia   . Dyspnea   . History of Doppler ultrasound 06/20/09   Normal, no prior studies for comparison.   . Hypertension   . Lightheadedness   . Mute   . Obese   . URI (upper respiratory infection)   . Ventricular septal defect 2004 per echo   small membranous VSD   Review of Systems: Negative except mentioned in HPI. Physical Exam:  Vitals:   05/22/18 1405  BP: 138/71  Pulse: 83  Temp: 98.8 F (37.1 C)  TempSrc: Oral  SpO2: 98%  Weight: 186 lb 8 oz (84.6 kg)  Height: 5\' 7"  (1.702 m)   General: Vital signs reviewed.  Patient is well-developed and well-nourished, in no acute distress and cooperative with exam.  Head: Normocephalic and atraumatic. Eyes: EOMI, conjunctivae normal, no scleral icterus.  Cardiovascular: RRR, S1 normal, S2 normal, no murmurs, gallops, or rubs. Pulmonary/Chest: Clear to auscultation bilaterally, no wheezes, rales, or rhonchi. Abdominal: Soft, non-tender, non-distended, BS +,  Extremities: No lower extremity edema bilaterally,  pulses symmetric and intact bilaterally. No cyanosis or clubbing. Skin: Warm, dry and intact. No rashes or erythema. Psychiatric: Normal mood and affect.  speech and behavior is normal. Cognition and memory are normal.  Assessment & Plan:   See Encounters Tab for problem based charting.  Patient discussed with Dr. Angelia Mould.

## 2018-05-22 NOTE — Assessment & Plan Note (Signed)
BP Readings from Last 3 Encounters:  05/22/18 138/71  03/05/18 (!) 153/66  02/27/18 134/73   Her blood pressure was mildly elevated.  She is on a multiple antihypertensives, we will continue with current management and will reevaluate during next follow-up visit.

## 2018-05-22 NOTE — Assessment & Plan Note (Signed)
Denies any acute exacerbation or shortness of breath.

## 2018-05-23 NOTE — Progress Notes (Signed)
Internal Medicine Clinic Attending  Case discussed with Dr. Amin at the time of the visit.  We reviewed the resident's history and exam and pertinent patient test results.  I agree with the assessment, diagnosis, and plan of care documented in the resident's note.    

## 2018-05-24 ENCOUNTER — Ambulatory Visit (HOSPITAL_COMMUNITY): Payer: Medicaid Other | Attending: Cardiovascular Disease

## 2018-05-24 ENCOUNTER — Other Ambulatory Visit: Payer: Self-pay

## 2018-05-24 DIAGNOSIS — Q210 Ventricular septal defect: Secondary | ICD-10-CM | POA: Diagnosis not present

## 2018-06-02 ENCOUNTER — Telehealth: Payer: Self-pay | Admitting: Cardiovascular Disease

## 2018-06-02 NOTE — Telephone Encounter (Signed)
Patient calling about her echo results.  Per Dr. 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  Had to leave message for patient to call back.

## 2018-06-02 NOTE — Telephone Encounter (Signed)
New message    Pt is returning a phone call     Please call back

## 2018-06-02 NOTE — Telephone Encounter (Signed)
Patient aware of results.

## 2018-06-02 NOTE — Telephone Encounter (Signed)
Not sure if pt is returning a call to schedule her CT, though I do not see a phone note. I will route to both Pam I. RN for Dr. Johnsie Cancel and to [place].

## 2018-06-15 DIAGNOSIS — K319 Disease of stomach and duodenum, unspecified: Secondary | ICD-10-CM | POA: Diagnosis not present

## 2018-06-15 DIAGNOSIS — Z1211 Encounter for screening for malignant neoplasm of colon: Secondary | ICD-10-CM | POA: Diagnosis not present

## 2018-06-15 DIAGNOSIS — K3189 Other diseases of stomach and duodenum: Secondary | ICD-10-CM | POA: Diagnosis not present

## 2018-06-15 DIAGNOSIS — K648 Other hemorrhoids: Secondary | ICD-10-CM | POA: Diagnosis not present

## 2018-06-15 DIAGNOSIS — K529 Noninfective gastroenteritis and colitis, unspecified: Secondary | ICD-10-CM | POA: Diagnosis not present

## 2018-06-15 DIAGNOSIS — R112 Nausea with vomiting, unspecified: Secondary | ICD-10-CM | POA: Diagnosis not present

## 2018-06-15 DIAGNOSIS — R1084 Generalized abdominal pain: Secondary | ICD-10-CM | POA: Diagnosis not present

## 2018-06-15 DIAGNOSIS — R634 Abnormal weight loss: Secondary | ICD-10-CM | POA: Diagnosis not present

## 2018-06-23 ENCOUNTER — Ambulatory Visit: Payer: Medicaid Other

## 2018-07-04 ENCOUNTER — Other Ambulatory Visit: Payer: Self-pay | Admitting: Internal Medicine

## 2018-07-04 NOTE — Telephone Encounter (Signed)
Next appt scheduled 8/17 with PCP. 

## 2018-07-16 IMAGING — CT CT ABD-PELV W/ CM
2 of 5 series · 8 of 46 positions shown, 9 images · IV contrast (Iodine)
Comparison: August 03, 2014

CLINICAL DATA: Two day history of diffuse abdominal pain

EXAM:
CT ABDOMEN AND PELVIS WITH CONTRAST
TECHNIQUE: Multidetector CT imaging of the abdomen and pelvis was performed
using the standard protocol following bolus administration of
intravenous contrast.
CONTRAST:  100mL 3ORQKH-XGG IOPAMIDOL (3ORQKH-XGG) INJECTION 61%

[Series 201: routine, idose (2) · axial · 0.98mm/px · z∈[-705,-345]mm · 5 of 94 slices shown, 6 images]
[im 11/94  soft-tissue]
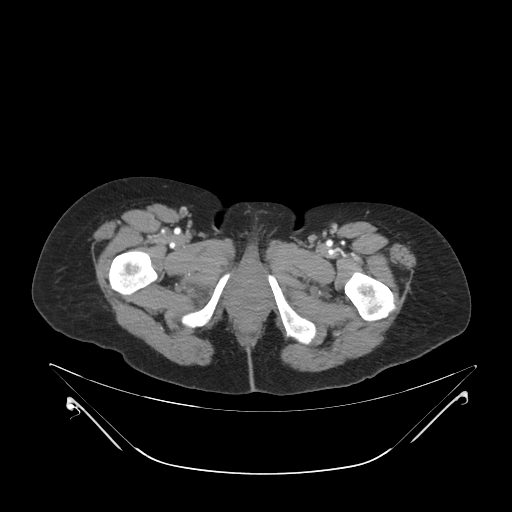
[im 11/94  bone]
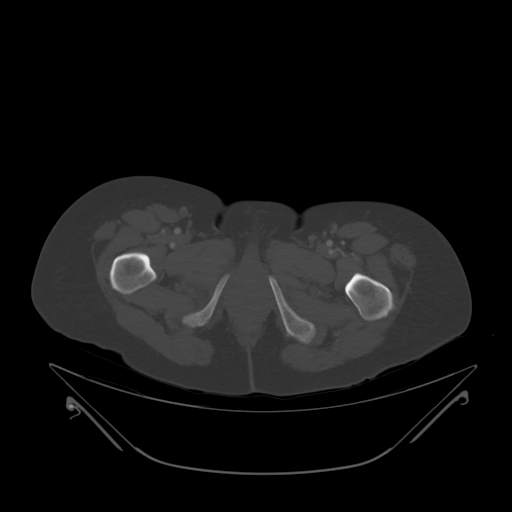
[im 32/94  soft-tissue]
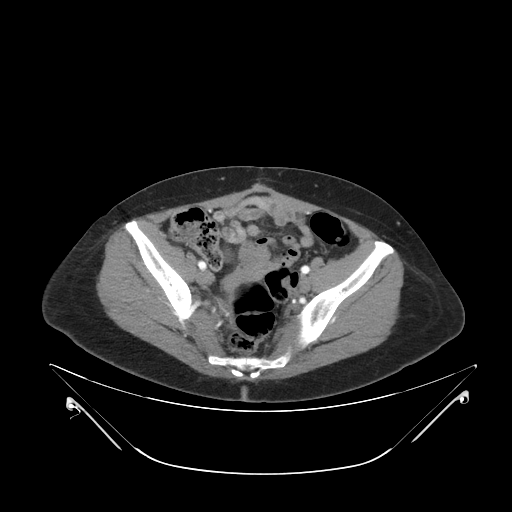
[im 47/94  soft-tissue]
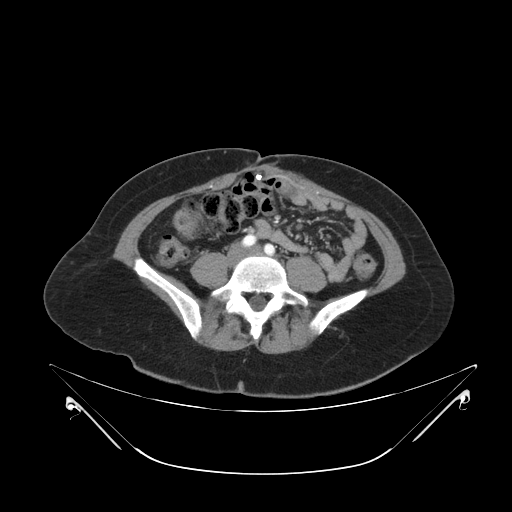
[im 63/94  soft-tissue]
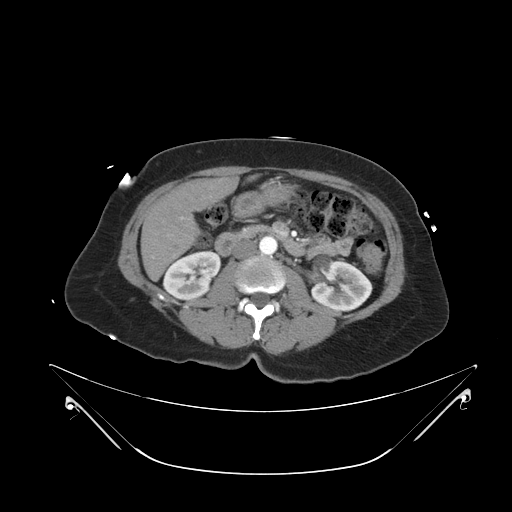
[im 83/94  soft-tissue]
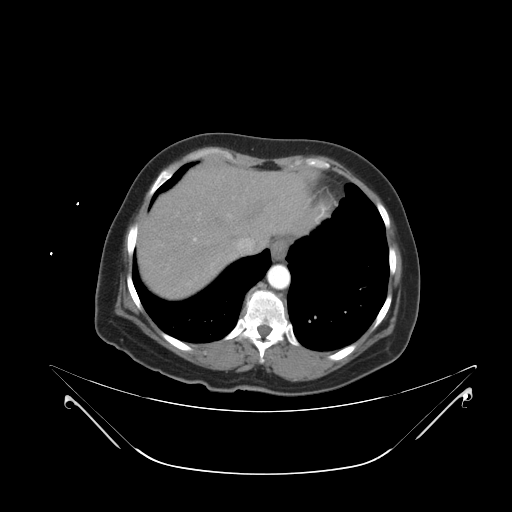

[Series 203: coronals, idose (2) · coronal · 0.45mm/px · 3 of 117 slices shown]
[im 39/117  soft-tissue]
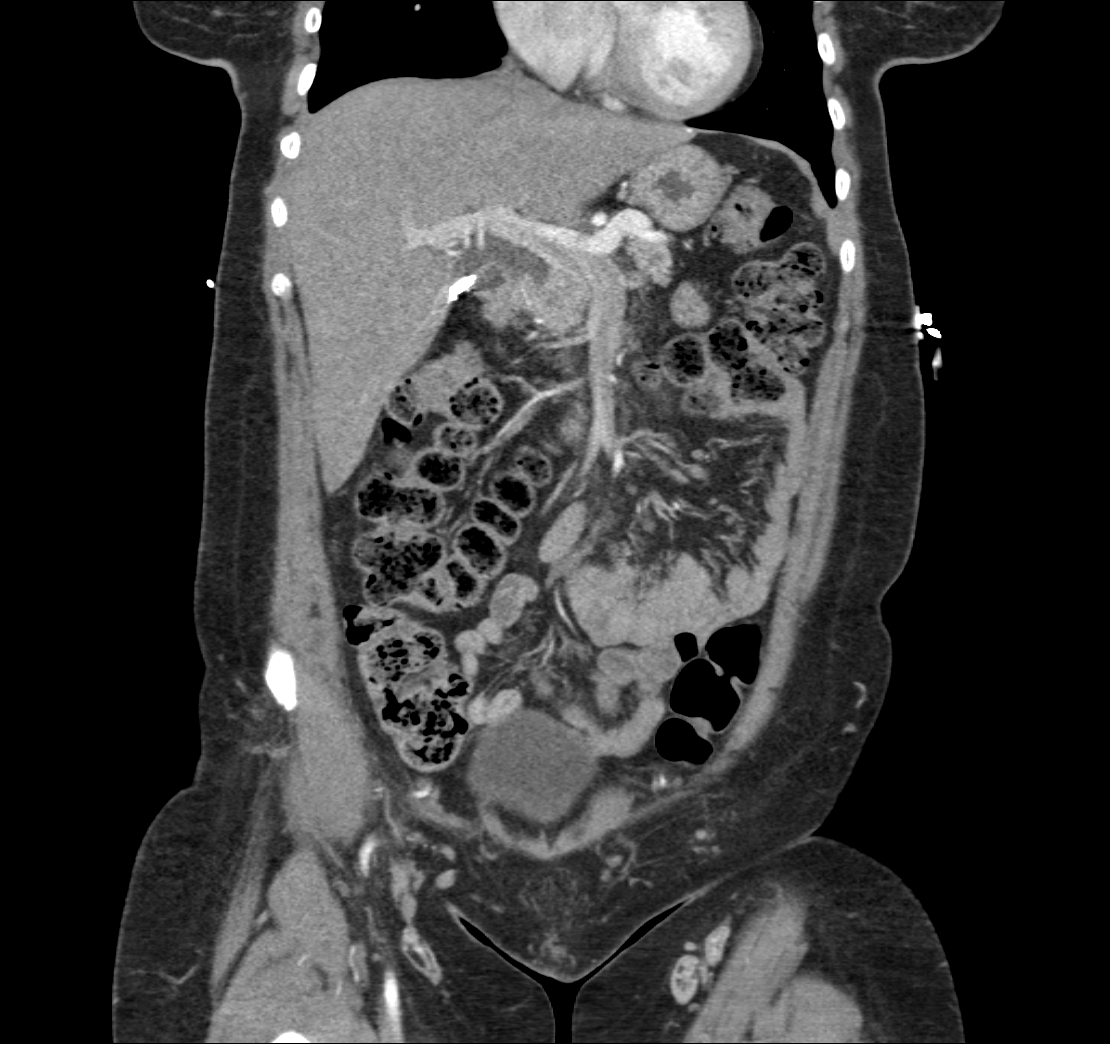
[im 52/117  soft-tissue]
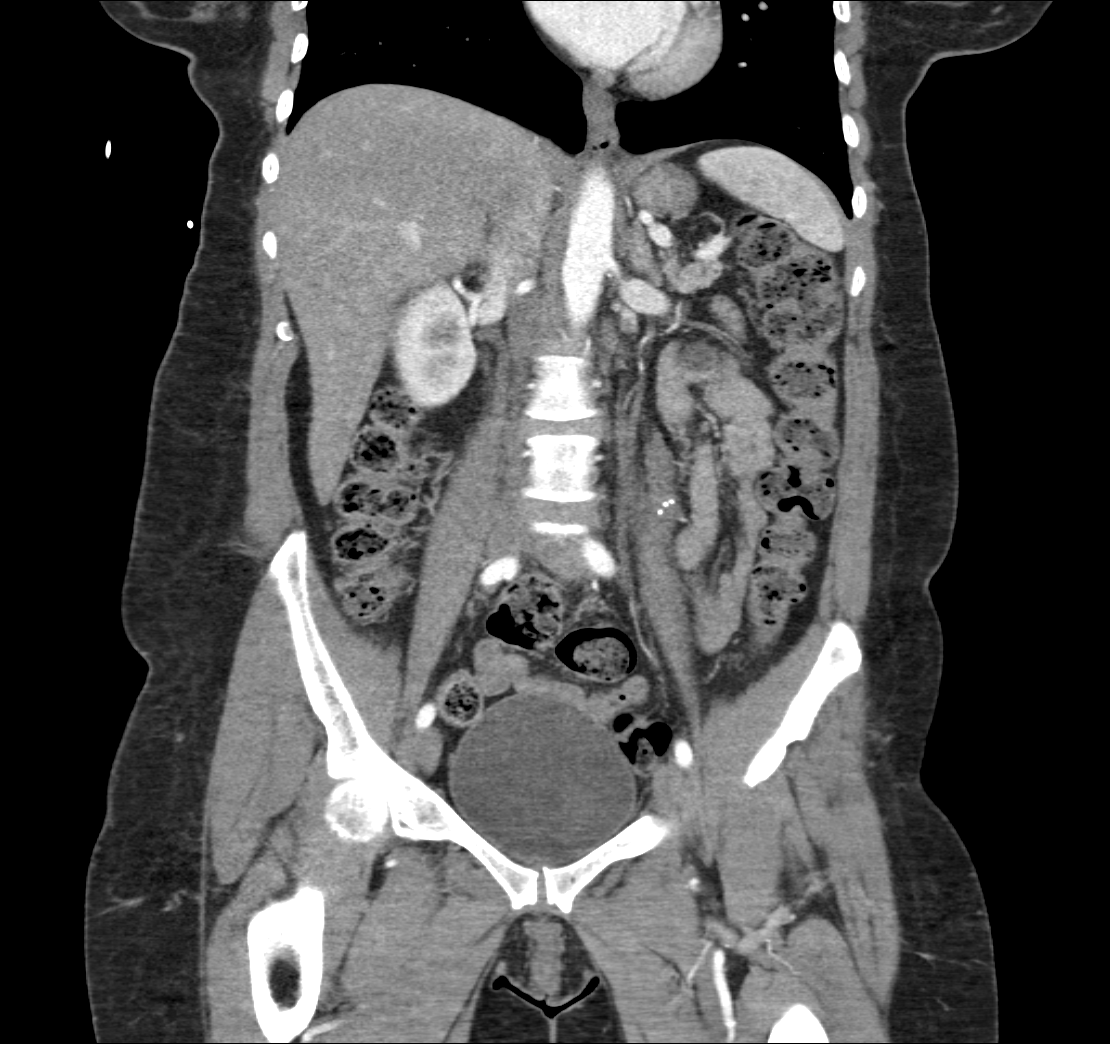
[im 65/117  soft-tissue]
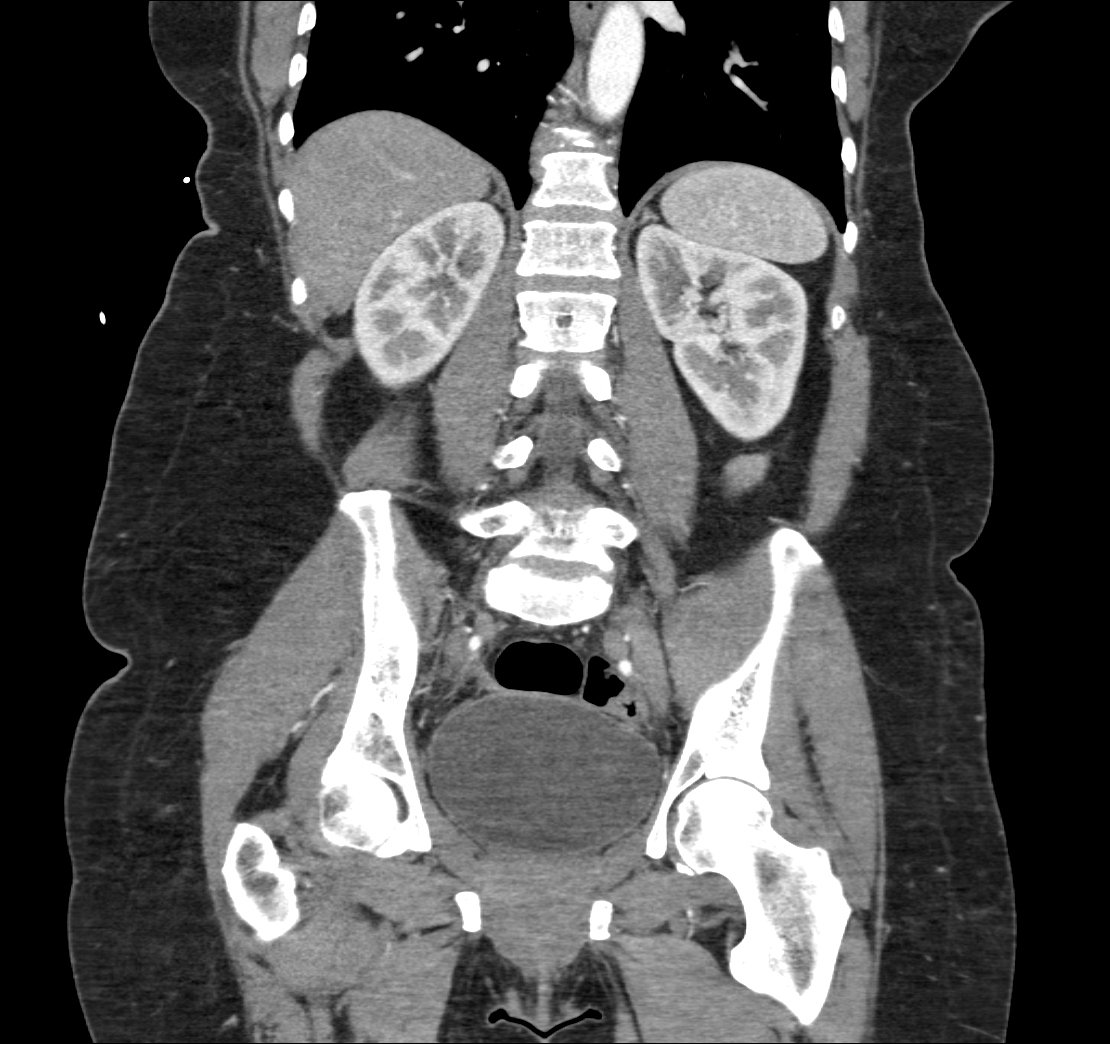

[8 of 46 positions shown; findings below may reference images not displayed]

FINDINGS: Lower chest: There is mild bibasilar atelectatic change. Lung bases
otherwise are clear.

Hepatobiliary: There is hepatic steatosis. There is a 1.0 x 0.9 cm
enhancing focus near the dome of the liver on the right posteriorly,
likely a small hemangioma. No other focal liver lesion is evident on
this study. Gallbladder is absent. There is no appreciable biliary
duct dilatation.

Pancreas: No pancreatic mass or inflammatory focus.

Spleen: No splenic lesions are evident.

Adrenals/Urinary Tract: Adrenals appear unremarkable bilaterally.
There is a 1.7 x 1.7 cm cyst in the anterior mid to lower portion
left kidney. There is no hydronephrosis on either side. There is no
renal or ureteral calculus on either side. Urinary bladder is
midline with wall thickness within normal limits.

Stomach/Bowel: There is no appreciable bowel wall or mesenteric
thickening. There is diffuse stool throughout the colon. Bowel
appears adhesed to the anterior abdominal wall slightly previous
ventral hernia without evidence of bowel compromise in this area.
There is no bowel obstruction. No free air or portal venous air.

Vascular/Lymphatic: There is no abdominal aortic aneurysm. No
vascular lesions are evident. There is no adenopathy in the abdomen
or pelvis.

Reproductive: Uterus is absent. There is no pelvic mass or pelvic
fluid collection.

Other: Appendix appears normal. There is no ascites or abscess in
the abdomen or pelvis. There is evidence of previous ventral hernia
repair at the umbilical level with scarring in the anterior
abdominal wall in this region. More superiorly, there is a small
ventral hernia containing only fat.

Musculoskeletal: There are no blastic or lytic bone lesions. There
is no intramuscular lesion.
IMPRESSION: Postoperative change in the periumbilical region with previous
repair of a ventral hernia. There is scarring in this area. Calcific
ease to this area without bowel compromise. No bowel obstruction or
appreciable bowel wall thickening. No free air. Slightly superior to
this postoperative change, there is a small midline ventral hernia
containing only fat.

Hepatic steatosis. Gallbladder absent. Probable small hemangioma in
the right lobe of the liver.

Uterus absent.

Appendix appears normal. No abscess. No renal or ureteral calculus
on either side. No hydronephrosis.

## 2018-08-04 ENCOUNTER — Ambulatory Visit: Payer: Medicaid Other

## 2018-08-21 ENCOUNTER — Ambulatory Visit (INDEPENDENT_AMBULATORY_CARE_PROVIDER_SITE_OTHER): Payer: Medicaid Other | Admitting: Internal Medicine

## 2018-08-21 ENCOUNTER — Other Ambulatory Visit: Payer: Self-pay

## 2018-08-21 ENCOUNTER — Encounter: Payer: Self-pay | Admitting: Internal Medicine

## 2018-08-21 VITALS — BP 130/76 | HR 82 | Temp 98.0°F | Ht 67.0 in | Wt 184.7 lb

## 2018-08-21 DIAGNOSIS — R809 Proteinuria, unspecified: Secondary | ICD-10-CM

## 2018-08-21 DIAGNOSIS — E1129 Type 2 diabetes mellitus with other diabetic kidney complication: Secondary | ICD-10-CM | POA: Diagnosis not present

## 2018-08-21 DIAGNOSIS — E119 Type 2 diabetes mellitus without complications: Secondary | ICD-10-CM | POA: Diagnosis not present

## 2018-08-21 DIAGNOSIS — Z79899 Other long term (current) drug therapy: Secondary | ICD-10-CM

## 2018-08-21 DIAGNOSIS — I10 Essential (primary) hypertension: Secondary | ICD-10-CM

## 2018-08-21 DIAGNOSIS — J45909 Unspecified asthma, uncomplicated: Secondary | ICD-10-CM | POA: Diagnosis not present

## 2018-08-21 DIAGNOSIS — Z7984 Long term (current) use of oral hypoglycemic drugs: Secondary | ICD-10-CM

## 2018-08-21 LAB — POCT GLYCOSYLATED HEMOGLOBIN (HGB A1C): Hemoglobin A1C: 6.5 % — AB (ref 4.0–5.6)

## 2018-08-21 LAB — GLUCOSE, CAPILLARY: Glucose-Capillary: 172 mg/dL — ABNORMAL HIGH (ref 70–99)

## 2018-08-21 NOTE — Assessment & Plan Note (Signed)
Stable; no URI symptoms, cough or wheezing.

## 2018-08-21 NOTE — Assessment & Plan Note (Addendum)
Patient is at goal for type II DM. Doing well on cardizem, HCTZ, and Losartan.  Checking BMP today to ensure kidney function is stable.

## 2018-08-21 NOTE — Assessment & Plan Note (Addendum)
Patient's A1C has improved from 7.4>6.5. I congratulated her today. Will push follow-up to 6 months out since she is well controlled.  Continue Metformin and Invokana She will call to schedule eye exam with her ophthalmologist. Diabetic foot exam done today is normal.

## 2018-08-21 NOTE — Progress Notes (Signed)
   CC: DM, HTN  HPI:  [name] is a 63 y.o. female with PMHx listed below who presents for follow-up on DM and HTN.  Patient has no acute concerns today. Please see problem based charting for further details.   Past Medical History:  Diagnosis Date  . Atypical chest pain    myoview 10/29/10 - Post-stress EF 56%. Low risk and negative for ischemia   . Back pain   . Back pain   . 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 Chronic PID   . Chronic PID (chronic pelvic inflammatory disease)   . Deaf    Since age 15  . Diabetes mellitus   . Dizziness   . Dyslipidemia   . Dyspnea   . History of Doppler ultrasound 06/20/09   Normal, no prior studies for comparison.   . Hypertension   . Lightheadedness   . Mute   . Obese   . URI (upper respiratory infection)   . Ventricular septal defect 2004 per echo   small membranous VSD   Review of Systems:    Review of Systems  Constitutional: Negative for chills and fever.  HENT: Negative for congestion and sore throat.   Eyes: Negative for blurred vision and double vision.  Respiratory: Negative for cough and shortness of breath.   Cardiovascular: Negative for chest pain and palpitations.  Gastrointestinal: Negative for constipation and diarrhea.  Genitourinary: Negative for dysuria.  Musculoskeletal: Negative for joint pain.    Physical Exam:  Vitals:   08/21/18 1313  BP: 130/76  Pulse: 82  Temp: 98 F (36.7 C)  TempSrc: Oral  SpO2: 100%  Weight: 184 lb 11.2 oz (83.8 kg)  Height: 5\' 7"  (1.702 m)   General: alert, pleasant, well-appearing female in NAD HEENT: conjunctiva clear Neck: supple; no thryomegaly CV: RRR; no m/r/g; distal pulses 2+ bilaterally  Pulm: normal work of breathing; lungs CTAB Abd: abdomen is soft, non-distended, non-tender Ext: no edema Psych: pleasant mood and affect   Assessment & Plan:   See Encounters Tab for problem based charting.   Patient discussed with Dr. Lynnae January

## 2018-08-21 NOTE — Patient Instructions (Signed)
Janice Massey, It was good to see you as always!  Congratulations!! Your A1C has gone from 7.4 to 6.5! That is wonderful news. Keep up the great work!   We will not make any changes to your medications today. Be sure to call and schedule your eye appointment.   Take care, and we'll see you again in 3 months.   Dr. Koleen Distance

## 2018-08-22 ENCOUNTER — Other Ambulatory Visit: Payer: Self-pay

## 2018-08-22 ENCOUNTER — Ambulatory Visit: Payer: Medicaid Other | Admitting: Sports Medicine

## 2018-08-22 DIAGNOSIS — Z20822 Contact with and (suspected) exposure to covid-19: Secondary | ICD-10-CM

## 2018-08-22 LAB — BMP8+ANION GAP
Anion Gap: 18 mmol/L (ref 10.0–18.0)
BUN/Creatinine Ratio: 15 (ref 12–28)
BUN: 16 mg/dL (ref 8–27)
CO2: 25 mmol/L (ref 20–29)
Calcium: 9.5 mg/dL (ref 8.7–10.3)
Chloride: 98 mmol/L (ref 96–106)
Creatinine, Ser: 1.08 mg/dL — ABNORMAL HIGH (ref 0.57–1.00)
GFR calc Af Amer: 64 mL/min/{1.73_m2} (ref >59)
GFR calc non Af Amer: 55 mL/min/{1.73_m2} — ABNORMAL LOW (ref >59)
Glucose: 118 mg/dL — ABNORMAL HIGH (ref 65–99)
Potassium: 3.6 mmol/L (ref 3.5–5.2)
Sodium: 141 mmol/L (ref 134–144)

## 2018-08-22 NOTE — Progress Notes (Signed)
Internal Medicine Clinic Attending  Case discussed with Dr. Bloomfield at the time of the visit.  We reviewed the resident's history and exam and pertinent patient test results.  I agree with the assessment, diagnosis, and plan of care documented in the resident's note.  

## 2018-08-23 ENCOUNTER — Other Ambulatory Visit: Payer: Self-pay

## 2018-08-23 ENCOUNTER — Ambulatory Visit (INDEPENDENT_AMBULATORY_CARE_PROVIDER_SITE_OTHER): Payer: Medicaid Other | Admitting: Family Medicine

## 2018-08-23 VITALS — BP 134/76 | Ht 67.0 in | Wt 184.0 lb

## 2018-08-23 DIAGNOSIS — M25551 Pain in right hip: Secondary | ICD-10-CM | POA: Diagnosis not present

## 2018-08-23 DIAGNOSIS — M25562 Pain in left knee: Secondary | ICD-10-CM

## 2018-08-23 DIAGNOSIS — M25512 Pain in left shoulder: Secondary | ICD-10-CM

## 2018-08-23 LAB — NOVEL CORONAVIRUS, NAA: SARS-CoV-2, NAA: NOT DETECTED

## 2018-08-23 MED ORDER — METHYLPREDNISOLONE ACETATE 40 MG/ML IJ SUSP
40.0000 mg | Freq: Once | INTRAMUSCULAR | Status: AC
  Administered 2018-08-23: 40 mg via INTRA_ARTICULAR

## 2018-08-23 MED ORDER — MELOXICAM 15 MG PO TABS: 15.0000 mg | ORAL_TABLET | Freq: Every day | ORAL | 2 refills | Status: DC

## 2018-08-23 NOTE — Progress Notes (Signed)
PCP: Modena Nunnery D, DO  Subjective:   HPI: Patient is a 63 y.o. female here for left shoulder pain, right hip/thigh pain, and left knee pain.  Left shoulder pain: The patient fell about 2 weeks ago and since then has been having left shoulder pain radiating into her left deltoid area.  She has tried aspirin to make the pain better, but no Tylenol or ibuprofen.  Resting the arm seems to make the pain better, but pain is worsened with movement.  The pain is constant throughout the day and described as an aching/throbbing pain.  Pain is 7/10 at baseline.  Right hip/thigh pain: For 2 months the patient has been having right lateral hip pain that runs down the outside and front of her right thigh.  She received a steroid injection for the same problem in March 2019.  She was not given any exercises at the time.  The shot helped to alleviate her pain.  She is interested in having another steroid injection today.  Resting makes her pain feel better, movement makes the pain worse.  The pain is 8/10 at baseline.  Left knee: When the patient fell on August 5 she fell onto her left knee and "banged it up", as well.  At the time of injury the patient had lots of skin lacerations and ecchymoses, but these have since healed.  She says the pain is worse right below her left knee.  The pain does not spread anywhere.  She describes the pain as sharp/achy, 5/10 at baseline.  Any motion causes her to have pain in his knee, nothing seems to make it much better.  The patient denies fevers, headaches, chest pain, shortness of breath, nausea, vomiting, abdominal pain, and focal neurological deficits.  Past Medical History:  Diagnosis Date  . Atypical chest pain    myoview 10/29/10 - Post-stress EF 56%. Low risk and negative for ischemia   . Back pain   . Back pain   . 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 Chronic PID   . Chronic PID (chronic pelvic  inflammatory disease)   . Deaf    Since age 45  . Diabetes mellitus   . Dizziness   . Dyslipidemia   . Dyspnea   . History of Doppler ultrasound 06/20/09   Normal, no prior studies for comparison.   . Hypertension   . Lightheadedness   . Mute   . Obese   . URI (upper respiratory infection)   . Ventricular septal defect 2004 per echo   small membranous VSD    Current Outpatient Medications on File Prior to Visit  Medication Sig Dispense Refill  . albuterol (PROVENTIL HFA;VENTOLIN HFA) 108 (90 Base) MCG/ACT inhaler Inhale 2 puffs into the lungs every 4 (four) hours as needed for wheezing or shortness of breath (or coughing). 1 Inhaler 0  . aspirin 81 MG chewable tablet Chew 81 mg by mouth daily.    Marland Kitchen atorvastatin (LIPITOR) 40 MG tablet TAKE 1 TABLET(40 MG) BY MOUTH DAILY 30 tablet 2  . cetirizine (ZYRTEC) 10 MG tablet Take 1 tablet (10 mg total) by mouth daily. 30 tablet 3  . ciprofloxacin (CIPRO) 500 MG tablet Take 1 tablet (500 mg total) by mouth 2 (two) times daily. 14 tablet 0  . dicyclomine (BENTYL) 20 MG tablet Take 1 tablet (20 mg total) by mouth 4 (four) times daily as needed for spasms. 20 tablet 0  . diltiazem (  CARDIZEM CD) 120 MG 24 hr capsule TAKE 1 CAPSULE(120 MG) BY MOUTH DAILY 90 capsule 1  . hydrochlorothiazide (HYDRODIURIL) 25 MG tablet Take 1 tablet (25 mg total) by mouth daily. 30 tablet 5  . HYDROcodone-acetaminophen (NORCO/VICODIN) 5-325 MG tablet Take 1-2 tablets by mouth every 6 (six) hours as needed. 6 tablet 0  . INVOKANA 100 MG TABS tablet TAKE 1 TABLET(100 MG) BY MOUTH DAILY BEFORE BREAKFAST 90 tablet 1  . losartan (COZAAR) 100 MG tablet Take 1 tablet (100 mg total) by mouth daily. 90 tablet 2  . metFORMIN (GLUCOPHAGE) 1000 MG tablet Take 1 tablet (1,000 mg total) by mouth 2 (two) times daily. 180 tablet 1  . metroNIDAZOLE (FLAGYL) 500 MG tablet Take 1 tablet (500 mg total) by mouth 2 (two) times daily. 14 tablet 0  . omeprazole (PRILOSEC) 40 MG capsule Take 1  capsule (40 mg total) by mouth daily for 30 days. 30 capsule 3  . ondansetron (ZOFRAN-ODT) 4 MG disintegrating tablet Take 1 tablet (4 mg total) by mouth every 8 (eight) hours as needed for nausea or vomiting. 8 tablet 0  . polyethylene glycol (MIRALAX) packet Take 17 g by mouth daily as needed for moderate constipation (while taking pain medicine). 10 each 0   No current facility-administered medications on file prior to visit.     Past Surgical History:  Procedure Laterality Date  . CARDIAC CATHETERIZATION  08/23/95  . CHOLECYSTECTOMY  11/04/1993  . COLPOSCOPY    . TUBAL LIGATION  10/05/1978    Allergies  Allergen Reactions  . Codeine Itching  . Lisinopril Cough  . Penicillins Itching    Has patient had a PCN reaction causing immediate rash, facial/tongue/throat swelling, SOB or lightheadedness with hypotension: YES Has patient had a PCN reaction causing severe rash involving mucus membranes or skin necrosis: NO Has patient had a PCN reaction that required hospitalization NO Has patient had a PCN reaction occurring within the last 10 years: NO If all of the above answers are "NO", then may proceed with Cephalosporin use.    Social History   Socioeconomic History  . Marital status: Single    Spouse name: Not on file  . Number of children: 1  . Years of education: Not on file  . Highest education level: Not on file  Occupational History    Employer: UNEMPLOYED  Social Needs  . Financial resource strain: Not on file  . Food insecurity    Worry: Not on file    Inability: Not on file  . Transportation needs    Medical: Not on file    Non-medical: Not on file  Tobacco Use  . Smoking status: Never Smoker  . Smokeless tobacco: Never Used  Substance and Sexual Activity  . Alcohol use: No    Alcohol/week: 0.0 standard drinks  . Drug use: No  . Sexual activity: Never    Birth control/protection: Surgical  Lifestyle  . Physical activity    Days per week: Not on file     Minutes per session: Not on file  . Stress: Not on file  Relationships  . Social Herbalist on phone: Not on file    Gets together: Not on file    Attends religious service: Not on file    Active member of club or organization: Not on file    Attends meetings of clubs or organizations: Not on file    Relationship status: Not on file  . Intimate partner violence  Fear of current or ex partner: Not on file    Emotionally abused: Not on file    Physically abused: Not on file    Forced sexual activity: Not on file  Other Topics Concern  . Not on file  Social History Narrative  . Not on file    Family History  Problem Relation Age of Onset  . Cancer Mother        liver  . Heart attack Mother 31  . Hypertension Father   . Diabetes Father   . Heart attack Father   . CAD Father 20  . Diabetes Paternal Aunt   . Diabetes Maternal Grandfather     BP 134/76   Ht 5\' 7"  (1.702 m)   Wt 184 lb (83.5 kg)   BMI 28.82 kg/m   Review of Systems: See HPI above.     Objective:  Physical Exam:  Gen: NAD, comfortable in exam room Respiratory: Normal work of breathing  Left Shoulder: Inspection reveals no obvious deformity, atrophy, or asymmetry. No bruising. No swelling TTP over [facility] joint, lateral shoulder. Limited ROM in flexion, abduction, internal/external rotation Special Tests:  - Impingement: + Hawkins, + neers, 4/5 strength with empty can with pain. - Supraspinatous: 4/5 strength with empty can. - Infraspinatous/Teres Minor: 5/5 strength with ER - Subscapularis: 5/5 strength with IR - Biceps tendon: Negative Yergason's  - AC Joint: Negative cross arm - Negative drop arm  Right Shoulder: Inspection reveals no obvious deformity, atrophy, or asymmetry. No bruising. No swelling Palpation is normal with no TTP over [facility] joint or bicipital groove. Full ROM in flexion, abduction, internal/external rotation NV intact distally Normal scapular function  observed. Special Tests:  - Impingement: Neg Hawkins, neers, empty can sign. - Supraspinatous: Negative empty can.  5/5 strength with resisted flexion at 20 degrees - Infraspinatous/Teres Minor: 5/5 strength with ER - Subscapularis: 5/5 strength with IR - Biceps tendon: Negative Yerrgason's  - AC Joint: Negative cross arm - No drop arm sign  Left Hip:  - Inspection: No gross deformity, no swelling, erythema, or ecchymosis - Palpation: No TTP, specifically none over greater trochanter - ROM: Normal range of motion in Flexion, extension, abduction, internal and external rotation - Strength: Normal strength. - Neuro/vasc: NV intact distally - Special Tests: Negative FABER and FADIR.    Right Hip:  - Inspection: No gross deformity, no swelling, erythema, or ecchymosis - Palpation: TTP elicited over greater trochanter and IT band - ROM: Normal range of motion with pain on abduction - Strength: Decreased strength secondary to pain. - Neuro/vasc: NV intact distally - Special Tests: Positive FABER and FADIR.   Left Knee: - Inspection: no gross deformity.  Remnants of bruising appreciated, no erythema. Skin intact - Palpation: TTP diffusely anteriorly over patella, tibial tubercle - ROM: full active ROM with flexion and extension in knee and hip - Strength: 5/5 strength - Neuro/vasc: NV intact - Special Tests: - LIGAMENTS: negative anterior and posterior drawer, negative Lachman's, no MCL or LCL laxity  -- PF JOINT: nml patellar mobility bilaterally.  negative patellar apprehension  Right Knee: - Inspection: no gross deformity. No swelling/effusion, erythema or bruising. Skin intact - Palpation: no TTP - ROM: full active ROM with flexion and extension in knee and hip - Strength: 5/5 strength - Neuro/vasc: NV intact - Special Tests: - LIGAMENTS: negative anterior and posterior drawer, negative Lachman's, no MCL or LCL laxity  -- PF JOINT: nml patellar mobility bilaterally. negative  patellar apprehension  Procedure performed:  RIGHT hip intraarticular corticosteroid injection; ultrasound guided, in-plane approach Consent obtained and verified. Time-out conducted. Noted no overlying erythema, induration, or other signs of local infection. The RIGHT greater trochanter was identified with ultrasound. The overlying skin was prepped in a sterile fashion. Topical analgesic spray: Ethyl chloride. Joint: RIGHT greater trochanteric bursa Needle: 22 gauge spinal The patient tolerated the procedure well  Advised to call if fevers/chills, erythema, induration, drainage, or persistent bleeding.   Assessment & Plan:  1.  Right lateral hip pain secondary to IT band syndrome with trochanteric bursitis: -Avoid painful activities much as possible -Consider icing the hip 3-4 times daily for 15 minutes -Hip abduction exercise 3 sets of 10 once daily, add weights that this becomes too easy -Stretches pick 2-3 and hold for 20-30 seconds 3 times, perform once or twice daily -Meloxicam 15 mg daily with food for pain/inflammation -Patient given CSI to trochanteric bursa -If not improving, will consider physical therapy  2.  Left shoulder pain secondary to left rotator cuff strain: -Avoid painful activities -Meloxicam 15 mg daily with food for pain/inflammation -Can take Tylenol 1-2 tablets 2-3 times daily -Perform home exercises with Thera-Band 3 sets of 10 once daily -If not improving at follow-up, will consider further imaging, injection, PT  3.  Left knee pain secondary to knee contusion from fall: -Patient improve on its own with time -Ice the knee 15 minutes 3-4 times daily -Meloxicam 15 milligrams daily  Follow-up 1 month.  Milus Banister, [place], PGY-2 08/23/2018 5:45 PM

## 2018-08-23 NOTE — Patient Instructions (Signed)
You have IT band syndrome with trochanteric bursitis. Avoid painful activities as much as possible. Ice over area of pain 3-4 times a day for 15 minutes at a time Hip side raise exercise 3 sets of 10 once a day - add weights if this becomes too easy. Stretches - pick 2-3 and hold for 20-30 seconds x 3 - do once or twice a day. Meloxicam 15mg  daily with food for pain and inflammation. You were given a shot for this today. If not improving, can consider physical therapy. Follow up with me in 1 month.  You strained your left rotator cuff of your shoulder. Try to avoid painful activities (overhead activities, lifting with extended arm) as much as possible. Meloxicam as noted above. Can take tylenol in addition to this. Consider physical therapy with transition to home exercise program. Do home exercise program with theraband and scapular stabilization exercises daily 3 sets of 10 once a day. If not improving at follow-up we will consider further imaging, injection, physical therapy, and/or nitro patches. Follow up with me in 1 month.  You have a knee contusion. Icing, the meloxicam should help with this.

## 2018-08-24 ENCOUNTER — Telehealth: Payer: Self-pay | Admitting: Internal Medicine

## 2018-08-24 NOTE — Telephone Encounter (Signed)
Pt is calling regarding COVID results [phone]

## 2018-08-24 NOTE — Telephone Encounter (Signed)
Relayed negative Covid result to patient via sign language interpreter. Patient was very Patent attorney. Hubbard Hartshorn, RN, BSN

## 2018-08-25 ENCOUNTER — Encounter: Payer: Self-pay | Admitting: Family Medicine

## 2018-08-29 ENCOUNTER — Other Ambulatory Visit: Payer: Self-pay

## 2018-08-29 DIAGNOSIS — Z20822 Contact with and (suspected) exposure to covid-19: Secondary | ICD-10-CM

## 2018-08-30 ENCOUNTER — Telehealth: Payer: Self-pay

## 2018-08-30 LAB — NOVEL CORONAVIRUS, NAA: SARS-CoV-2, NAA: NOT DETECTED

## 2018-08-30 NOTE — Telephone Encounter (Signed)
Patient returned call for [place] lab results - DOB verified - results given, no further questions.

## 2018-09-01 ENCOUNTER — Ambulatory Visit: Payer: Self-pay

## 2018-09-12 ENCOUNTER — Ambulatory Visit (INDEPENDENT_AMBULATORY_CARE_PROVIDER_SITE_OTHER): Payer: Self-pay | Admitting: Family Medicine

## 2018-09-12 ENCOUNTER — Encounter: Payer: Self-pay | Admitting: Family Medicine

## 2018-09-12 ENCOUNTER — Other Ambulatory Visit: Payer: Self-pay

## 2018-09-12 VITALS — BP 102/60 | HR 58 | Wt 174.2 lb

## 2018-09-12 DIAGNOSIS — Z13220 Encounter for screening for lipoid disorders: Secondary | ICD-10-CM

## 2018-09-12 DIAGNOSIS — Z131 Encounter for screening for diabetes mellitus: Secondary | ICD-10-CM

## 2018-09-12 DIAGNOSIS — Z1159 Encounter for screening for other viral diseases: Secondary | ICD-10-CM

## 2018-09-12 DIAGNOSIS — Z1239 Encounter for other screening for malignant neoplasm of breast: Secondary | ICD-10-CM

## 2018-09-12 DIAGNOSIS — Z0000 Encounter for general adult medical examination without abnormal findings: Secondary | ICD-10-CM

## 2018-09-12 DIAGNOSIS — Z23 Encounter for immunization: Secondary | ICD-10-CM

## 2018-09-12 DIAGNOSIS — Z1211 Encounter for screening for malignant neoplasm of colon: Secondary | ICD-10-CM

## 2018-09-12 NOTE — Progress Notes (Signed)
Subjective:    Patient ID: Brandi Mendez, female    DOB: [birth year], 63 y.o.   MRN: [id number]   CC: New Patient  HPI: PMHx: Past Medical History:  Diagnosis Date  . Small bowel obstruction ([place]) 2013, 2015    Surgical Hx: Past Surgical History:  Procedure Laterality Date  . ABDOMINAL HERNIA REPAIR    . ABDOMINAL HYSTERECTOMY     [facility]  . CHOLECYSTECTOMY    . Left ankle surgery     Total hysterectomy - never been on hormone replacement.   Family Hx: Family History  Problem Relation Age of Onset  . Cancer Mother        Breast? but patient not sure  . Diabetes Father   . Diabetes Sister   . Early death Brother 67       aneurysm  . Heart disease Maternal Grandmother   . Diabetes Paternal Grandfather      Social Hx: Who lives at home: Daughter and 4 grandchildren 09/13/2018  Who would speak for you about health care matters: Daughter Brandi Mendez [phone] 09/13/2018 ADL/IADL's: Yes Transportation: Car 09/13/2018 Important Relationships & Pets: grandchildren 09/13/2018  Current Stressors: None 09/13/2018 Work / Education:  Retired 09/13/2018 Religious / Personal Beliefs: None 09/13/2018 Interests / Fun: Park with grand children 09/13/2018 Tobacco: Denies  Alcohol: Very seldom Drugs: None Illicit Drugs:None  reports that she has never smoked. She has never used smokeless tobacco. She reports current alcohol use. She reports that she does not use drugs.  Medications: None  ROS: Woman:  Patient reports no  vision/ hearing changes,anorexia, weight change, fever ,adenopathy, persistant / recurrent hoarseness, swallowing issues, chest pain, edema, persistant / recurrent cough, hemoptysis, dyspnea (rest, exertional, paroxysmal nocturnal), gastrointestinal bleeding (melena, rectal bleeding), abdominal pain, excessive heart burn, GU symptoms(dysuria, hematuria, pyuria, voiding/incontinence  Issues), syncope, focal weakness, severe memory loss, concerning skin lesions, depression,  anxiety, abnormal bruising/bleeding, major joint swelling, breast masses or abnormal vaginal bleeding.    Preventative Screening Colonoscopy: 02/14/2008 - negative Mammogram: 04/30/2014 - negative Pap test: total hysterectomy Tetanus vaccine: 09/18/2010 Pneumonia vaccine: None Shingles vaccine:  None Heart stress test: None Echocardiogram: None CT/MRI: CT abd/pelvis 08/2017 notable for SBO   MR brain w/out 07/2016: WNL  Health Maintenance Due  Topic Date Due  . MAMMOGRAM  04/29/2016  . COLONOSCOPY  02/13/2018     Smoking status reviewed  Review of Systems   Objective:  BP 102/60   Pulse (!) 58   Wt 174 lb 3.2 oz (79 kg)   SpO2 99%   BMI 28.12 kg/m  Vitals and nursing note reviewed  General: well nourished, in no acute distress HEENT: normocephalic, TM's visualized bilaterally, no scleral icterus or conjunctival pallor, no nasal discharge, moist mucous membranes, good dentition without erythema or discharge noted in posterior oropharynx Cardiac: RRR, clear S1 and S2, no murmurs, rubs, or gallops Respiratory: clear to auscultation bilaterally, no increased work of breathing Abdomen: soft, nontender, nondistended, Bowel sounds present Extremities: no edema or cyanosis. Warm, well perfused. 2+ radial and PT pulses bilaterally, Surgical line at medial left ankle with mild swelling at medial malleolus. nontender to palpation.  No increased warmth or erythema Skin: warm and dry, no rashes noted Neuro: alert and oriented, no focal deficits  Assessment & Plan:   Establish Care/Health Maintenance: >3 years since last visit at [facility]. Patient here to have annual exam and meet new PCP. Extensive reveiw of medical history. No current medications or significant PMH. Will obtain baseline  labs to screen for diabetes, HLD, and kidney disease.  - obtain CBC, CMP, A1C, lipid panel - Future order for labs placed. Will have patient return for lab visit to complete  History of Anemia: Last  hemoglobin 11.1 as of June 2019. Will obtain CBC to evaluate. If low, may need iron studies for further evaluation.   Health Maintenance: - Mammogram ordered - Colonoscopy ordered. No family history of colon cancer. Last Colonoscopy negative - Flu vaccine given - Hep C lab negative  Return in about 1 year (around 09/12/2019) for annual.   Orpah CobbKiersten Burnham Trost, DO [facility], PGY2 09/12/2018

## 2018-09-12 NOTE — Patient Instructions (Signed)
Thank you so much for coming in today!  It was so great to meet you!  I have placed in some orders for blood work and referrals for colonoscopy and mammogram.  Please schedule these as soon as is convenient for you.  I will call you with the results of your blood work if anything abnormal, otherwise I will send a letter with your results.    Let us try to plan for annual exam sooner if any new concerns.  Take care, Dr. Tarry Kos

## 2018-09-13 LAB — HEPATITIS C ANTIBODY (REFLEX): HCV Ab: <0.1 s/co ratio (ref 0.0–0.9)

## 2018-09-13 LAB — HCV COMMENT:

## 2018-09-19 ENCOUNTER — Other Ambulatory Visit: Payer: Self-pay | Admitting: Internal Medicine

## 2018-09-19 DIAGNOSIS — E1122 Type 2 diabetes mellitus with diabetic chronic kidney disease: Secondary | ICD-10-CM

## 2018-09-19 DIAGNOSIS — N183 Chronic kidney disease, stage 3 unspecified: Secondary | ICD-10-CM

## 2018-09-20 ENCOUNTER — Other Ambulatory Visit (INDEPENDENT_AMBULATORY_CARE_PROVIDER_SITE_OTHER): Payer: Self-pay

## 2018-09-20 ENCOUNTER — Other Ambulatory Visit: Payer: Self-pay

## 2018-09-20 DIAGNOSIS — Z0000 Encounter for general adult medical examination without abnormal findings: Secondary | ICD-10-CM

## 2018-09-20 DIAGNOSIS — Z13220 Encounter for screening for lipoid disorders: Secondary | ICD-10-CM

## 2018-09-20 DIAGNOSIS — Z131 Encounter for screening for diabetes mellitus: Secondary | ICD-10-CM

## 2018-09-20 LAB — POCT GLYCOSYLATED HEMOGLOBIN (HGB A1C): Hemoglobin A1C: 5.8 % — AB (ref 4.0–5.6)

## 2018-09-21 ENCOUNTER — Ambulatory Visit
Admission: RE | Admit: 2018-09-21 | Discharge: 2018-09-21 | Disposition: A | Discharge status: Home / Self Care | Payer: Medicaid Other | Source: Ambulatory Visit | Attending: Gastroenterology | Admitting: Gastroenterology

## 2018-09-21 ENCOUNTER — Other Ambulatory Visit: Payer: Self-pay | Admitting: *Deleted

## 2018-09-21 ENCOUNTER — Other Ambulatory Visit: Payer: Self-pay

## 2018-09-21 DIAGNOSIS — Z1231 Encounter for screening mammogram for malignant neoplasm of breast: Secondary | ICD-10-CM | POA: Diagnosis not present

## 2018-09-21 LAB — COMPREHENSIVE METABOLIC PANEL
ALT: 13 IU/L (ref 0–32)
AST: 14 IU/L (ref 0–40)
Albumin/Globulin Ratio: 2 (ref 1.2–2.2)
Albumin: 4.5 g/dL (ref 3.8–4.8)
Alkaline Phosphatase: 69 IU/L (ref 39–117)
BUN/Creatinine Ratio: 29 — ABNORMAL HIGH (ref 12–28)
BUN: 19 mg/dL (ref 8–27)
Bilirubin Total: 0.8 mg/dL (ref 0.0–1.2)
CO2: 24 mmol/L (ref 20–29)
Calcium: 9.3 mg/dL (ref 8.7–10.3)
Chloride: 106 mmol/L (ref 96–106)
Creatinine, Ser: 0.66 mg/dL (ref 0.57–1.00)
GFR calc Af Amer: 109 mL/min/{1.73_m2} (ref >59)
GFR calc non Af Amer: 95 mL/min/{1.73_m2} (ref >59)
Globulin, Total: 2.2 g/dL (ref 1.5–4.5)
Glucose: 98 mg/dL (ref 65–99)
Potassium: 4.3 mmol/L (ref 3.5–5.2)
Sodium: 142 mmol/L (ref 134–144)
Total Protein: 6.7 g/dL (ref 6.0–8.5)

## 2018-09-21 LAB — LIPID PANEL
Chol/HDL Ratio: 4.5 ratio — ABNORMAL HIGH (ref 0.0–4.4)
Cholesterol, Total: 202 mg/dL — ABNORMAL HIGH (ref 100–199)
HDL: 45 mg/dL (ref >39)
LDL Chol Calc (NIH): 135 mg/dL — ABNORMAL HIGH (ref 0–99)
Triglycerides: 123 mg/dL (ref 0–149)
VLDL Cholesterol Cal: 22 mg/dL (ref 5–40)

## 2018-09-21 LAB — CBC
Hematocrit: 35.9 % (ref 34.0–46.6)
Hemoglobin: 12 g/dL (ref 11.1–15.9)
MCH: 30.5 pg (ref 26.6–33.0)
MCHC: 33.4 g/dL (ref 31.5–35.7)
MCV: 91 fL (ref 79–97)
Platelets: 275 10*3/uL (ref 150–450)
RBC: 3.93 x10E6/uL (ref 3.77–5.28)
RDW: 12.8 % (ref 11.7–15.4)
WBC: 7 10*3/uL (ref 3.4–10.8)

## 2018-09-21 MED ORDER — OMEPRAZOLE 40 MG PO CPDR: 40.0000 mg | DELAYED_RELEASE_CAPSULE | Freq: Every day | ORAL | 3 refills | Status: DC

## 2018-09-21 MED ORDER — CETIRIZINE HCL 10 MG PO TABS: 10.0000 mg | ORAL_TABLET | Freq: Every day | ORAL | 3 refills | Status: DC

## 2018-09-21 NOTE — Progress Notes (Signed)
Discussed lab results. Elevated cholesterol panel (ASCVD risk 3.7%) thus statin therapy not indicated at this time. Recommended lifestyle modifications. Patient understood and agreed. Patient endorsed small amount of BRBPR on toilet paper after several bowel movements which has self resolved. Expect hemorrhoid. Hgb stable at 12 on last CBC. Recommended she continue to monitor. If continues without improvement or worsens then recommend further evaluation. Patient understood and agreed to plan.

## 2018-09-29 ENCOUNTER — Ambulatory Visit: Payer: Self-pay

## 2018-10-04 ENCOUNTER — Other Ambulatory Visit: Payer: Self-pay | Admitting: Internal Medicine

## 2018-10-11 ENCOUNTER — Ambulatory Visit
Admission: RE | Admit: 2018-10-11 | Discharge: 2018-10-11 | Disposition: A | Discharge status: Home / Self Care | Payer: No Typology Code available for payment source | Source: Ambulatory Visit | Attending: Family Medicine | Admitting: Family Medicine

## 2018-10-11 DIAGNOSIS — Z1211 Encounter for screening for malignant neoplasm of colon: Secondary | ICD-10-CM

## 2018-10-11 NOTE — Progress Notes (Signed)
Please let patient know her colonoscopy results were normal. Thank you!

## 2018-10-12 ENCOUNTER — Telehealth: Payer: Self-pay | Admitting: *Deleted

## 2018-10-12 NOTE — Telephone Encounter (Signed)
Pt informed. Brandi Mendez, CMA  

## 2018-10-12 NOTE — Telephone Encounter (Signed)
-----   Message from Brandi Mendez, [place] sent at 10/11/2018  2:55 PM EDT ----- Please let patient know her colonoscopy results were normal. Thank you!

## 2018-10-24 ENCOUNTER — Other Ambulatory Visit: Payer: Self-pay | Admitting: Internal Medicine

## 2018-10-24 DIAGNOSIS — E1129 Type 2 diabetes mellitus with other diabetic kidney complication: Secondary | ICD-10-CM

## 2018-11-03 ENCOUNTER — Other Ambulatory Visit: Payer: Self-pay

## 2018-11-03 ENCOUNTER — Encounter: Payer: Self-pay | Admitting: Podiatry

## 2018-11-03 ENCOUNTER — Ambulatory Visit: Payer: Medicaid Other | Admitting: Podiatry

## 2018-11-03 VITALS — BP 123/76 | HR 86 | Resp 16

## 2018-11-03 DIAGNOSIS — L603 Nail dystrophy: Secondary | ICD-10-CM | POA: Diagnosis not present

## 2018-11-03 DIAGNOSIS — L600 Ingrowing nail: Secondary | ICD-10-CM

## 2018-11-03 NOTE — Patient Instructions (Signed)

## 2018-11-06 ENCOUNTER — Encounter: Payer: Self-pay | Admitting: Podiatry

## 2018-11-06 NOTE — Progress Notes (Signed)
Subjective:  Patient ID: Janice Massey, female    DOB: 1955/03/27,  MRN: [id number]  Chief Complaint  Patient presents with  . Nail Problem    Hallux right - lateral border, darkened, sensitive area of nail x few months, no injury, no treatment  . Diabetes    Last a1c was 6.0  . New Patient (Initial Visit)    Est pt 2016    63 y.o. female presents with the above complaint.  Patient is complaining about the greenish discoloration to the nail on the lateral side of the hallux.  She also has an ingrown toenail to the same nail border.  She does not know how long the pain as well as a greenish discoloration has been around but it has been a couple of months.  She denies any other acute complaints she denies any other treatments or injury to the area.   Review of Systems: Negative except as noted in the HPI. Denies N/V/F/Ch.  Past Medical History:  Diagnosis Date  . Atypical chest pain    myoview 10/29/10 - Post-stress EF 56%. Low risk and negative for ischemia   . Back pain   . Back pain   . 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 Chronic PID   . Chronic PID (chronic pelvic inflammatory disease)   . Deaf    Since age 68  . Diabetes mellitus   . Dizziness   . Dyslipidemia   . Dyspnea   . History of Doppler ultrasound 06/20/09   Normal, no prior studies for comparison.   . Hypertension   . Lightheadedness   . Mute   . Obese   . URI (upper respiratory infection)   . Ventricular septal defect 2004 per echo   small membranous VSD    Current Outpatient Medications:  .  aspirin 81 MG chewable tablet, Chew 81 mg by mouth daily., Disp: , Rfl:  .  atorvastatin (LIPITOR) 40 MG tablet, TAKE 1 TABLET(40 MG) BY MOUTH DAILY, Disp: 90 tablet, Rfl: 1 .  cetirizine (ZYRTEC) 10 MG tablet, Take 1 tablet (10 mg total) by mouth daily., Disp: 30 tablet, Rfl: 3 .  ciprofloxacin (CIPRO) 500 MG tablet, Take 1 tablet (500 mg total) by mouth 2 (two)  times daily., Disp: 14 tablet, Rfl: 0 .  diltiazem (CARDIZEM CD) 120 MG 24 hr capsule, TAKE 1 CAPSULE(120 MG) BY MOUTH DAILY, Disp: 90 capsule, Rfl: 1 .  hydrochlorothiazide (HYDRODIURIL) 25 MG tablet, TAKE 1 TABLET(25 MG) BY MOUTH DAILY, Disp: 30 tablet, Rfl: 5 .  HYDROcodone-acetaminophen (NORCO/VICODIN) 5-325 MG tablet, Take 1-2 tablets by mouth every 6 (six) hours as needed., Disp: 6 tablet, Rfl: 0 .  INVOKANA 100 MG TABS tablet, TAKE 1 TABLET(100 MG) BY MOUTH DAILY BEFORE BREAKFAST, Disp: 90 tablet, Rfl: 1 .  losartan (COZAAR) 100 MG tablet, Take 1 tablet (100 mg total) by mouth daily., Disp: 90 tablet, Rfl: 2 .  meloxicam (MOBIC) 15 MG tablet, Take 1 tablet (15 mg total) by mouth daily., Disp: 30 tablet, Rfl: 2 .  metFORMIN (GLUCOPHAGE) 1000 MG tablet, TAKE 1 TABLET(1000 MG) BY MOUTH TWICE DAILY, Disp: 180 tablet, Rfl: 1 .  metroNIDAZOLE (FLAGYL) 500 MG tablet, Take 1 tablet (500 mg total) by mouth 2 (two) times daily., Disp: 14 tablet, Rfl: 0 .  omeprazole (PRILOSEC) 40 MG capsule, Take 1 capsule (40 mg total) by mouth daily., Disp: 30 capsule, Rfl: 3 .  ondansetron (ZOFRAN-ODT) 4  MG disintegrating tablet, Take 1 tablet (4 mg total) by mouth every 8 (eight) hours as needed for nausea or vomiting., Disp: 8 tablet, Rfl: 0  Social History   Tobacco Use  Smoking Status Never Smoker  Smokeless Tobacco Never Used    Allergies  Allergen Reactions  . Codeine Itching  . Lisinopril Cough  . Penicillins Itching    Has patient had a PCN reaction causing immediate rash, facial/tongue/throat swelling, SOB or lightheadedness with hypotension: YES Has patient had a PCN reaction causing severe rash involving mucus membranes or skin necrosis: NO Has patient had a PCN reaction that required hospitalization NO Has patient had a PCN reaction occurring within the last 10 years: NO If all of the above answers are "NO", then may proceed with Cephalosporin use.   Objective:   Vitals:   11/03/18 1001   BP: 123/76  Pulse: 86  Resp: 16   There is no height or weight on file to calculate BMI. Constitutional Well developed. Well nourished.  Vascular Dorsalis pedis pulses palpable bilaterally. Posterior tibial pulses palpable bilaterally. Capillary refill normal to all digits.  No cyanosis or clubbing noted. Pedal hair growth normal.  Neurologic Normal speech. Oriented to person, place, and time. Epicritic sensation to light touch grossly present bilaterally.  Dermatologic Painful ingrowing nail at lateral nail borders of the hallux nail right. No other open wounds. No skin lesions.  Orthopedic: Normal joint ROM without pain or crepitus bilaterally. No visible deformities. No bony tenderness.   Radiographs: None Assessment:   1. Ingrown right big toenail   2. Nail dystrophy    Plan:  Patient was evaluated and treated and all questions answered.  Ingrown Nail, right -Patient elects to proceed with minor surgery to remove ingrown toenail removal today. Consent reviewed and signed by patient. -Ingrown nail excised. See procedure note. -Educated on post-procedure care including soaking. Written instructions provided and reviewed. -Patient to follow up in 2 weeks for nail check. -Nail biopsy was performed and sent to pathology for evaluation of the greenish discoloration.  Procedure: Excision of Ingrown Toenail Location: Right 1st toe lateral nail borders. Anesthesia: Lidocaine 1% plain; 1.5 mL and Marcaine 0.5% plain; 1.5 mL, digital block. Skin Prep: Betadine. Dressing: Silvadene; telfa; dry, sterile, compression dressing. Technique: Following skin prep, the toe was exsanguinated and a tourniquet was secured at the base of the toe. The affected nail border was freed, split with a nail splitter, and excised. Chemical matrixectomy was then performed with phenol and irrigated out with alcohol. The tourniquet was then removed and sterile dressing applied. Disposition: Patient  tolerated procedure well. Patient to return in 2 weeks for follow-up.   Return in about 2 weeks (around 11/17/2018) for nail check, right foot, fungal culture results.

## 2018-11-22 ENCOUNTER — Ambulatory Visit: Payer: Medicaid Other | Admitting: Podiatry

## 2018-11-24 ENCOUNTER — Ambulatory Visit: Payer: Medicaid Other | Admitting: Cardiovascular Disease

## 2018-11-27 ENCOUNTER — Other Ambulatory Visit: Payer: Self-pay

## 2018-11-27 ENCOUNTER — Ambulatory Visit: Payer: Medicaid Other | Admitting: Podiatry

## 2018-11-27 ENCOUNTER — Encounter: Payer: Self-pay | Admitting: Podiatry

## 2018-11-27 DIAGNOSIS — L600 Ingrowing nail: Secondary | ICD-10-CM

## 2018-11-27 DIAGNOSIS — L603 Nail dystrophy: Secondary | ICD-10-CM

## 2018-11-27 NOTE — Progress Notes (Signed)
Subjective: Janice Massey is a 64 y.o.  female returns to office today for follow up evaluation after having right Hallux lateral nail avulsion performed. Patient has been soaking using epsom salt and applying topical antibiotic covered with bandaid daily. Patient denies fevers, chills, nausea, vomiting. Denies any calf pain, chest pain, SOB.   Objective:  Vitals: Reviewed  General: Well developed, nourished, in no acute distress, alert and oriented x3   Dermatology: Skin is warm, dry and supple bilateral. Right lateral hallux nail border appears to be clean, dry, with mild granular tissue and surrounding scab. There is no surrounding erythema, edema, drainage/purulence. The remaining nails appear unremarkable at this time. There are no other lesions or other signs of infection present.  Neurovascular status: Intact. No lower extremity swelling; No pain with calf compression bilateral.  Musculoskeletal: Decreased tenderness to palpation of the lateral hallux nail fold(s). Muscular strength within normal limits bilateral.   Assesement and Plan: S/p partial nail avulsion, doing well.   -Continue soaking in epsom salts twice a day followed by antibiotic ointment and a band-aid. Can leave uncovered at night. Continue this until completely healed.  -If the area has not healed in 2 weeks, call the office for follow-up appointment, or sooner if any problems arise.  -Monitor for any signs/symptoms of infection. Call the office immediately if any occur or go directly to the emergency room. Call with any questions/concerns. -I will hold off on treating the yeast that was isolated at this time.  I believe that since patient has considerably improved with no longer greenish discoloration it is not warranted to be treated at this time.  Boneta Lucks, DPM

## 2018-12-01 ENCOUNTER — Ambulatory Visit (HOSPITAL_COMMUNITY)
Admission: EM | Admit: 2018-12-01 | Discharge: 2018-12-01 | Disposition: A | Discharge status: Home / Self Care | Payer: Medicaid Other | Attending: Emergency Medicine | Admitting: Emergency Medicine

## 2018-12-01 ENCOUNTER — Encounter (HOSPITAL_COMMUNITY): Payer: Self-pay | Admitting: Emergency Medicine

## 2018-12-01 ENCOUNTER — Ambulatory Visit (HOSPITAL_COMMUNITY): Payer: Medicaid Other

## 2018-12-01 ENCOUNTER — Other Ambulatory Visit: Payer: Self-pay

## 2018-12-01 ENCOUNTER — Ambulatory Visit (INDEPENDENT_AMBULATORY_CARE_PROVIDER_SITE_OTHER): Payer: Medicaid Other

## 2018-12-01 DIAGNOSIS — M25512 Pain in left shoulder: Secondary | ICD-10-CM

## 2018-12-01 DIAGNOSIS — S4992XA Unspecified injury of left shoulder and upper arm, initial encounter: Secondary | ICD-10-CM | POA: Diagnosis not present

## 2018-12-01 DIAGNOSIS — G8929 Other chronic pain: Secondary | ICD-10-CM

## 2018-12-01 MED ORDER — IBUPROFEN 800 MG PO TABS: 800.0000 mg | ORAL_TABLET | Freq: Three times a day (TID) | ORAL | 0 refills | Status: DC

## 2018-12-01 NOTE — ED Triage Notes (Signed)
Pt states in September she fell on her L shoulder. Shes been in pain for the last two months. She went to the doctor, and the doctor said ti was fine. But still c/o ongoing pain in her L shoulder.

## 2018-12-01 NOTE — ED Provider Notes (Signed)
[place]    CSN: [id number] Arrival date & time: 12/01/18  1148      History   Chief Complaint Chief Complaint  Patient presents with  . Arm Pain  . Fall    HPI Janice Massey is a 63 y.o. female.   67 y old presented to the urgent with a complaint of  right arm and right shoulder pain for the past 2 month. Patient stated she fell on her side after hitting a cart. She went to see her PCP who stated nothing was wrong. She stated since she has been in pain and  has been using Meloxicam to control her pain and it has been ineffective. She reported swelling and decrease range of motion. And stated she want to have an X-ray  The history is provided by the patient. No language interpreter was used.    Past Medical History:  Diagnosis Date  . Atypical chest pain    myoview 10/29/10 - Post-stress EF 56%. Low risk and negative for ischemia   . Back pain   . Back pain   . 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 Chronic PID   . Chronic PID (chronic pelvic inflammatory disease)   . Deaf    Since age 75  . Diabetes mellitus   . Dizziness   . Dyslipidemia   . Dyspnea   . History of Doppler ultrasound 06/20/09   Normal, no prior studies for comparison.   . Hypertension   . Lightheadedness   . Mute   . Obese   . URI (upper respiratory infection)   . Ventricular septal defect 2004 per echo   small membranous VSD    Patient Active Problem List   Diagnosis Date Noted  . Need for immunization against influenza 10/30/2017  . 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 VSD (ventricular septal defect), perimembranous 05/29/2013  . Encounter for screening colonoscopy 11/09/2011  . Essential hypertension 08/10/2011  . Hyperlipidemia 10/11/2007  . Hearing loss 11/10/2005  . Asthma 11/10/2005  . GERD 11/10/2005  . Diabetes ([place]) 04/05/2002    Past Surgical History:  Procedure Laterality Date  . CARDIAC  CATHETERIZATION  08/23/95  . CHOLECYSTECTOMY  11/04/1993  . COLPOSCOPY    . TUBAL LIGATION  10/05/1978    OB History    Gravida  2   Para  2   Term  2   Preterm      AB      Living  2     SAB      TAB      Ectopic      Multiple      Live Births               Home Medications    Prior to Admission medications   Medication Sig Start Date End Date Taking? Authorizing Provider  aspirin 81 MG chewable tablet Chew 81 mg by mouth daily.    [provider]  atorvastatin (LIPITOR) 40 MG tablet TAKE 1 TABLET(40 MG) BY MOUTH DAILY 10/24/18   Bloomfield, Carley D, DO  cetirizine (ZYRTEC) 10 MG tablet Take 1 tablet (10 mg total) by mouth daily. 09/21/18   [place]omfield, Carley D, DO  ciprofloxacin (CIPRO) 500 MG tablet Take 1 tablet (500 mg total) by mouth 2 (two) times daily. 02/27/18   Davonna Belling, MD  diltiazem (CARDIZEM CD) 120 MG 24 hr capsule TAKE 1 CAPSULE(120 MG) BY  MOUTH DAILY 04/17/18   Annia Belt, MD  hydrochlorothiazide (HYDRODIURIL) 25 MG tablet TAKE 1 TABLET(25 MG) BY MOUTH DAILY 10/04/18   Bloomfield, Carley D, DO  HYDROcodone-acetaminophen (NORCO/VICODIN) 5-325 MG tablet Take 1-2 tablets by mouth every 6 (six) hours as needed. 02/27/18   Davonna Belling, MD  ibuprofen (ADVIL) 800 MG tablet Take 1 tablet (800 mg total) by mouth 3 (three) times daily. 12/01/18   Nichlas Pitera, Darrelyn Hillock, FNP  INVOKANA 100 MG TABS tablet TAKE 1 TABLET(100 MG) BY MOUTH DAILY BEFORE BREAKFAST 10/24/18   Bloomfield, Carley D, DO  losartan (COZAAR) 100 MG tablet Take 1 tablet (100 mg total) by mouth daily. 04/17/18   Annia Belt, MD  meloxicam (MOBIC) 15 MG tablet Take 1 tablet (15 mg total) by mouth daily. 08/23/18   Hudnall, Sharyn Lull, MD  metFORMIN (GLUCOPHAGE) 1000 MG tablet TAKE 1 TABLET(1000 MG) BY MOUTH TWICE DAILY 09/19/18   Bloomfield, Carley D, DO  metroNIDAZOLE (FLAGYL) 500 MG tablet Take 1 tablet (500 mg total) by mouth 2 (two) times daily. 02/27/18    Davonna Belling, MD  omeprazole (PRILOSEC) 40 MG capsule Take 1 capsule (40 mg total) by mouth daily. 09/21/18 10/21/18  Bloomfield, Carley D, DO  ondansetron (ZOFRAN-ODT) 4 MG disintegrating tablet Take 1 tablet (4 mg total) by mouth every 8 (eight) hours as needed for nausea or vomiting. 02/27/18   Davonna Belling, MD  hydrochlorothiazide (HYDRODIURIL) 25 MG tablet Take 1 tablet (25 mg total) by mouth daily. 03/19/18   Modena Nunnery D, DO    Family History Family History  Problem Relation Age of Onset  . Cancer Mother        liver  . Heart attack Mother 22  . Hypertension Father   . Diabetes Father   . Heart attack Father   . CAD Father 109  . Diabetes Paternal Aunt   . Diabetes Maternal Grandfather     Social History Social History   Tobacco Use  . Smoking status: Never Smoker  . Smokeless tobacco: Never Used  Substance Use Topics  . Alcohol use: No    Alcohol/week: 0.0 standard drinks  . Drug use: No     Allergies   Codeine, Lisinopril, and Penicillins   Review of Systems Review of Systems  Constitutional: Negative.  Negative for activity change, chills, fatigue and fever.  HENT: Negative.   Respiratory: Negative.   Cardiovascular: Negative.   Musculoskeletal: Positive for arthralgias and joint swelling.  ROS: All other are negatives   Physical Exam Triage Vital Signs ED Triage Vitals [12/01/18 1351]  Enc Vitals Group     BP (!) 141/83     Pulse Rate 80     Resp 18     Temp 97.7 F (36.5 C)     Temp src      SpO2 98 %     Weight      Height      Head Circumference      Peak Flow      Pain Score 10     Pain Loc      Pain Edu?      Excl. in [place]?    No data found.  Updated Vital Signs BP (!) 141/83   Pulse 80   Temp 97.7 F (36.5 C)   Resp 18   SpO2 98%   Visual Acuity Right Eye Distance:   Left Eye Distance:   Bilateral Distance:    Right Eye Near:   Left Eye  Near:    Bilateral Near:     Physical Exam Constitutional:       General: She is not in acute distress.    Appearance: Normal appearance. She is normal weight. She is not ill-appearing or toxic-appearing.  Cardiovascular:     Rate and Rhythm: Normal rate and regular rhythm.     Pulses: Normal pulses.     Heart sounds: Normal heart sounds. No murmur.  Pulmonary:     Effort: Pulmonary effort is normal. No respiratory distress.     Breath sounds: Normal breath sounds. No wheezing.  Chest:     Chest wall: No tenderness.  Musculoskeletal:        General: Swelling and tenderness present.     Right shoulder: She exhibits decreased range of motion, tenderness and swelling.  Neurological:     Mental Status: She is alert and oriented to person, place, and time.      UC Treatments / Results  Labs (all labs ordered are listed, but only abnormal results are displayed) Labs Reviewed - No data to display  EKG   Radiology Dg Shoulder Left  Result Date: 12/01/2018 CLINICAL DATA:  Fall with left shoulder pain. Symptoms for 2 months. EXAM: LEFT SHOULDER - 2+ VIEW COMPARISON:  03/12/2008 FINDINGS: There is no evidence of fracture or dislocation. Degenerative spurring at the acromioclavicular and glenohumeral joints. IMPRESSION: 1. No acute finding. 2. Degenerative spurring at the glenohumeral and acromioclavicular joints. Electronically Signed   By: Monte Fantasia M.D.   On: 12/01/2018 15:05    Procedures Procedures (including critical care time)  Medications Ordered in UC Medications - No data to display  Initial Impression / Assessment and Plan / UC Course  I have reviewed the triage vital signs and the nursing notes.  Pertinent labs & imaging results that were available during my care of the patient were reviewed by me and considered in my medical decision making (see chart for details).    Patient X-ray show no acute fracture or dislocation. Ibuprofen was prescribed for pain and she was advised to follow up with PCP for further referral to ortho  Final Clinical Impressions(s) / UC Diagnoses   Final diagnoses:  Chronic left shoulder pain     Discharge Instructions     Patient was advised to take his medication as preascribed Follow RICE instruction Follow up with PCP Stop Meloxicam Return if symptom get worse    ED Prescriptions    Medication Sig Dispense Auth. Provider   ibuprofen (ADVIL) 800 MG tablet Take 1 tablet (800 mg total) by mouth 3 (three) times daily. 42 tablet Lailani Tool, Darrelyn Hillock, FNP     PDMP not reviewed this encounter.   Emerson Monte, [place] 12/01/18 [phone]

## 2018-12-01 NOTE — Discharge Instructions (Addendum)
Patient was advised to take his medication as preascribed Follow RICE instruction Follow up with PCP Stop Meloxicam Return if symptom get worse

## 2018-12-05 LAB — CULT, FUNGUS, SKIN,HAIR,NAIL W/KOH
MICRO NUMBER:: 1049786
SMEAR:: NONE SEEN
SPECIMEN QUALITY:: ADEQUATE

## 2018-12-06 ENCOUNTER — Telehealth: Payer: Self-pay

## 2018-12-07 ENCOUNTER — Other Ambulatory Visit: Payer: Self-pay

## 2018-12-07 DIAGNOSIS — Z20822 Contact with and (suspected) exposure to covid-19: Secondary | ICD-10-CM

## 2018-12-11 LAB — NOVEL CORONAVIRUS, NAA: SARS-CoV-2, NAA: NOT DETECTED

## 2018-12-13 ENCOUNTER — Ambulatory Visit (INDEPENDENT_AMBULATORY_CARE_PROVIDER_SITE_OTHER): Payer: Medicaid Other | Admitting: Family Medicine

## 2018-12-13 ENCOUNTER — Other Ambulatory Visit: Payer: Self-pay

## 2018-12-13 VITALS — BP 142/80 | Ht 67.0 in | Wt 180.0 lb

## 2018-12-13 DIAGNOSIS — M7631 Iliotibial band syndrome, right leg: Secondary | ICD-10-CM

## 2018-12-13 DIAGNOSIS — M25512 Pain in left shoulder: Secondary | ICD-10-CM

## 2018-12-13 NOTE — Patient Instructions (Signed)
You have IT band syndrome of your right hip. Avoid painful activities as much as possible. Ice over area of pain 3-4 times a day for 15 minutes at a time Hip side raise exercise 3 sets of 10 once a day - add weights if this becomes too easy. Stretches - pick 2-3 and hold for 20-30 seconds x 3 - do once or twice a day. Take ibuprofen 600mg  three times a day with food. Tylenol 500mg  1-2 tabs three times a day in addition to this. Topical aspercreme, biofreeze, salon pas patches may be helpful also. Start physical therapy and do the home exercises on days you don't go to therapy.  We will go ahead with an MRI of your left shoulder to assess for a rotator cuff tear. Follow up with me after your MRI for a no charge visit to go over results.

## 2018-12-14 ENCOUNTER — Encounter: Payer: Self-pay | Admitting: Family Medicine

## 2018-12-14 NOTE — Progress Notes (Signed)
PCP: Modena Nunnery D, DO  Subjective:   HPI: Patient is a 63 y.o. female here for left shoulder, right hip pain.  8/19: Left shoulder pain: The patient fell about 2 weeks ago and since then has been having left shoulder pain radiating into her left deltoid area.  She has tried aspirin to make the pain better, but no Tylenol or ibuprofen.  Resting the arm seems to make the pain better, but pain is worsened with movement.  The pain is constant throughout the day and described as an aching/throbbing pain.  Pain is 7/10 at baseline.  Right hip/thigh pain: For 2 months the patient has been having right lateral hip pain that runs down the outside and front of her right thigh.  She received a steroid injection for the same problem in March 2019.  She was not given any exercises at the time.  The shot helped to alleviate her pain.  She is interested in having another steroid injection today.  Resting makes her pain feel better, movement makes the pain worse.  The pain is 8/10 at baseline.  12/9: Patient seen with sign language interpreter. She reports trochanteric bursa injection only helped temporarily and didn't resolve her pain. Pain is lateral right hip, worse to lie on this side. Bigger issue is her left shoulder pain radiating down into elbow. Pain is lateral, worse trying to raise overhead. Tried meloxicam without benefit. Home exercises have not helped as well since last visit.  Past Medical History:  Diagnosis Date  . Atypical chest pain    myoview 10/29/10 - Post-stress EF 56%. Low risk and negative for ischemia   . Back pain   . Back pain   . 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 Chronic PID   . Chronic PID (chronic pelvic inflammatory disease)   . Deaf    Since age 3  . Diabetes mellitus   . Dizziness   . Dyslipidemia   . Dyspnea   . History of Doppler ultrasound 06/20/09   Normal, no prior studies for comparison.   .  Hypertension   . Lightheadedness   . Mute   . Obese   . URI (upper respiratory infection)   . Ventricular septal defect 2004 per echo   small membranous VSD    Current Outpatient Medications on File Prior to Visit  Medication Sig Dispense Refill  . aspirin 81 MG chewable tablet Chew 81 mg by mouth daily.    Marland Kitchen atorvastatin (LIPITOR) 40 MG tablet TAKE 1 TABLET(40 MG) BY MOUTH DAILY 90 tablet 1  . cetirizine (ZYRTEC) 10 MG tablet Take 1 tablet (10 mg total) by mouth daily. 30 tablet 3  . ciprofloxacin (CIPRO) 500 MG tablet Take 1 tablet (500 mg total) by mouth 2 (two) times daily. 14 tablet 0  . diltiazem (CARDIZEM CD) 120 MG 24 hr capsule TAKE 1 CAPSULE(120 MG) BY MOUTH DAILY 90 capsule 1  . hydrochlorothiazide (HYDRODIURIL) 25 MG tablet TAKE 1 TABLET(25 MG) BY MOUTH DAILY 30 tablet 5  . HYDROcodone-acetaminophen (NORCO/VICODIN) 5-325 MG tablet Take 1-2 tablets by mouth every 6 (six) hours as needed. 6 tablet 0  . ibuprofen (ADVIL) 800 MG tablet Take 1 tablet (800 mg total) by mouth 3 (three) times daily. 42 tablet 0  . INVOKANA 100 MG TABS tablet TAKE 1 TABLET(100 MG) BY MOUTH DAILY BEFORE BREAKFAST 90 tablet 1  . losartan (COZAAR) 100 MG tablet Take 1 tablet (100  mg total) by mouth daily. 90 tablet 2  . meloxicam (MOBIC) 15 MG tablet Take 1 tablet (15 mg total) by mouth daily. 30 tablet 2  . metFORMIN (GLUCOPHAGE) 1000 MG tablet TAKE 1 TABLET(1000 MG) BY MOUTH TWICE DAILY 180 tablet 1  . metroNIDAZOLE (FLAGYL) 500 MG tablet Take 1 tablet (500 mg total) by mouth 2 (two) times daily. 14 tablet 0  . omeprazole (PRILOSEC) 40 MG capsule Take 1 capsule (40 mg total) by mouth daily. 30 capsule 3  . ondansetron (ZOFRAN-ODT) 4 MG disintegrating tablet Take 1 tablet (4 mg total) by mouth every 8 (eight) hours as needed for nausea or vomiting. 8 tablet 0  . [DISCONTINUED] hydrochlorothiazide (HYDRODIURIL) 25 MG tablet Take 1 tablet (25 mg total) by mouth daily. 30 tablet 5   No current  facility-administered medications on file prior to visit.    Past Surgical History:  Procedure Laterality Date  . CARDIAC CATHETERIZATION  08/23/95  . CHOLECYSTECTOMY  11/04/1993  . COLPOSCOPY    . TUBAL LIGATION  10/05/1978    Allergies  Allergen Reactions  . Codeine Itching  . Lisinopril Cough  . Penicillins Itching    Has patient had a PCN reaction causing immediate rash, facial/tongue/throat swelling, SOB or lightheadedness with hypotension: YES Has patient had a PCN reaction causing severe rash involving mucus membranes or skin necrosis: NO Has patient had a PCN reaction that required hospitalization NO Has patient had a PCN reaction occurring within the last 10 years: NO If all of the above answers are "NO", then may proceed with Cephalosporin use.    Social History   Socioeconomic History  . Marital status: Single    Spouse name: Not on file  . Number of children: 1  . Years of education: Not on file  . Highest education level: Not on file  Occupational History    Employer: UNEMPLOYED  Tobacco Use  . Smoking status: Never Smoker  . Smokeless tobacco: Never Used  Substance and Sexual Activity  . Alcohol use: No    Alcohol/week: 0.0 standard drinks  . Drug use: No  . Sexual activity: Never    Birth control/protection: Surgical  Other Topics Concern  . Not on file  Social History Narrative  . Not on file   Social Determinants of Health   Financial Resource Strain:   . Difficulty of Paying Living Expenses: Not on file  Food Insecurity:   . Worried About Charity fundraiser in the Last Year: Not on file  . Ran Out of Food in the Last Year: Not on file  Transportation Needs:   . Lack of Transportation (Medical): Not on file  . Lack of Transportation (Non-Medical): Not on file  Physical Activity:   . Days of Exercise per Week: Not on file  . Minutes of Exercise per Session: Not on file  Stress:   . Feeling of Stress : Not on file  Social Connections:   .  Frequency of Communication with Friends and Family: Not on file  . Frequency of Social Gatherings with Friends and Family: Not on file  . Attends Religious Services: Not on file  . Active Member of Clubs or Organizations: Not on file  . Attends Archivist Meetings: Not on file  . Marital Status: Not on file  Intimate Partner Violence:   . Fear of Current or Ex-Partner: Not on file  . Emotionally Abused: Not on file  . Physically Abused: Not on file  . Sexually Abused:  Not on file    Family History  Problem Relation Age of Onset  . Cancer Mother        liver  . Heart attack Mother 70  . Hypertension Father   . Diabetes Father   . Heart attack Father   . CAD Father 63  . Diabetes Paternal Aunt   . Diabetes Maternal Grandfather     BP (!) 142/80   Ht 5\' 7"  (1.702 m)   Wt 180 lb (81.6 kg)   BMI 28.19 kg/m   Review of Systems: See HPI above.     Objective:  Physical Exam:  Gen: NAD, comfortable in exam room  Left shoulder: No swelling, ecchymoses.  No gross deformity. Diffuse TTP superior, lateral shoulder. Full ER, IR.  Abduction and flexion only to about 90 degrees, painful. Positive Hawkins, Neers. Negative Yergasons. Strength 4/5 with empty can and resisted internal/external rotation.  Empty can and ER painful. NV intact distally.  Right hip: No deformity, instability. FROM with 5/5 strength flexion. TTP greater trochanter reproducing her pain. NVI distally. Negative logroll.   Assessment & Plan:  1. Left shoulder pain - s/p a fall.  Not improving with meloxicam, home exercise program for over 3 months.  Radiographs negative for fracture, other bony abnormality to account for her pain.  Will go ahead with MRI to assess for rotator cuff tear.  2. Right trochanteric bursitis, IT band syndrome - start physical therapy.  Injection only provided temporary relief.  Tylenol, topical medications, ibuprofen.

## 2018-12-20 ENCOUNTER — Ambulatory Visit: Payer: No Typology Code available for payment source | Admitting: Family Medicine

## 2018-12-25 ENCOUNTER — Other Ambulatory Visit: Payer: Self-pay | Admitting: *Deleted

## 2018-12-25 MED ORDER — DILTIAZEM HCL ER COATED BEADS 120 MG PO CP24: ORAL_CAPSULE | ORAL | 1 refills | Status: DC

## 2019-01-03 ENCOUNTER — Ambulatory Visit
Admission: RE | Admit: 2019-01-03 | Discharge: 2019-01-03 | Disposition: A | Discharge status: Home / Self Care | Payer: Medicaid Other | Source: Ambulatory Visit | Attending: Family Medicine | Admitting: Family Medicine

## 2019-01-03 DIAGNOSIS — M25512 Pain in left shoulder: Secondary | ICD-10-CM

## 2019-01-03 DIAGNOSIS — M75122 Complete rotator cuff tear or rupture of left shoulder, not specified as traumatic: Secondary | ICD-10-CM | POA: Diagnosis not present

## 2019-01-09 ENCOUNTER — Encounter: Payer: Self-pay | Admitting: Physical Therapy

## 2019-01-09 ENCOUNTER — Ambulatory Visit: Payer: Medicaid Other | Attending: Family Medicine | Admitting: Physical Therapy

## 2019-01-09 ENCOUNTER — Other Ambulatory Visit: Payer: Self-pay

## 2019-01-09 DIAGNOSIS — M25512 Pain in left shoulder: Secondary | ICD-10-CM | POA: Insufficient documentation

## 2019-01-09 DIAGNOSIS — M25612 Stiffness of left shoulder, not elsewhere classified: Secondary | ICD-10-CM | POA: Insufficient documentation

## 2019-01-09 DIAGNOSIS — G8929 Other chronic pain: Secondary | ICD-10-CM | POA: Diagnosis present

## 2019-01-09 DIAGNOSIS — M6281 Muscle weakness (generalized): Secondary | ICD-10-CM | POA: Diagnosis present

## 2019-01-09 NOTE — Patient Instructions (Signed)
    SHOULDER: Flexion - Supine (Cane)    Hold cane in both hands. Raise arms up overhead. Do not allow back to arch. Hold _10__ seconds. __10_ reps per set, __2_ sets per day, __5-7_ days per week   Copyright  VHI. All rights reserved.  External Rotation (Passive)    With elbow bent and forearm on table, palm down, bend forward at waist until a stretch is felt. Hold ____15-20 seconds. Repeat ___10_ times. Do _2___ sessions per day.  Copyright  VHI. All rights reserved.

## 2019-01-09 NOTE — Therapy (Signed)
[place], [place], [address] Phone: [phone]   Fax:  [phone]  Physical Therapy Evaluation  Patient Details  Name: Janice Massey MRN: [id number] Date of Birth: 1955-12-30 Referring Provider (PT): Dr. Karlton Lemon    Encounter Date: 01/09/2019  PT End of Session - 01/09/19 1510    Visit Number  1    Number of Visits  4    Date for PT Re-Evaluation  02/06/19    Authorization Type  MCD    PT Start Time  [id number]    PT Stop Time  1533    PT Time Calculation (min)  39 min    Activity Tolerance  Patient limited by pain    Behavior During Therapy  [facility] for tasks assessed/performed       Past Medical History:  Diagnosis Date  . Atypical chest pain    myoview 10/29/10 - Post-stress EF 56%. Low risk and negative for ischemia   . Back pain   . Back pain   . 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 Chronic PID   . Chronic PID (chronic pelvic inflammatory disease)   . Deaf    Since age 72  . Diabetes mellitus   . Dizziness   . Dyslipidemia   . Dyspnea   . History of Doppler ultrasound 06/20/09   Normal, no prior studies for comparison.   . Hypertension   . Lightheadedness   . Mute   . Obese   . URI (upper respiratory infection)   . Ventricular septal defect 2004 per echo   small membranous VSD    Past Surgical History:  Procedure Laterality Date  . CARDIAC CATHETERIZATION  08/23/95  . CHOLECYSTECTOMY  11/04/1993  . COLPOSCOPY    . TUBAL LIGATION  10/05/1978    There were no vitals filed for this visit.   Subjective Assessment - 01/09/19 1502    Subjective  Pt fell in 08/2018.  She injured her L shoulder. She has difficulty cleaning, using her L UE for ADLs.  She reports weakness and radiating pain into her L hand.  It is not improved and intermittent.  L elbow hurts. Her Rt hip is a chronic issue and she has had 2 injections that have helped.    Patient is accompained by:  Interpreter    Pertinent History  diabetes, cardiac, deaf since age 5    Limitations  Lifting;House hold activities;Other (comment)   sleep   Diagnostic tests  MRI 01/03/19: Moderate tendinosis of the supraspinatus tendon with a largefull-thickness tear of the anterior half of the supraspinatus tendon.    Patient Stated Goals  pain relief    Currently in Pain?  Yes    Pain Score  9     Pain Location  Shoulder    Pain Orientation  Left    Pain Descriptors / Indicators  Burning;Sore    Pain Type  Chronic pain    Pain Radiating Towards  to elbow    Pain Onset  More than a month ago    Pain Frequency  Intermittent    Aggravating Factors   using her arm    Pain Relieving Factors  ibuprofen , did not help         [facility] PT Assessment - 01/09/19 0001      Assessment   Medical Diagnosis  L shoulder pain, Rt ITB chronic    Referring Provider (  PT)  Dr. Karlton Lemon     Onset Date/Surgical Date  --   08/2018   Hand Dominance  Right    Next MD Visit  unknown     Prior Therapy  No       Precautions   Precautions  None      Restrictions   Weight Bearing Restrictions  No      Balance Screen   Has the patient fallen in the past 6 months  Yes    How many times?  1   slipped, was holding grandbaby    Has the patient had a decrease in activity level because of a fear of falling?   Yes    Is the patient reluctant to leave their home because of a fear of falling?   Yes      [place] residence      Prior Function   Level of Independence  Independent with household mobility without device;Independent with community mobility without device      Cognition   Overall Cognitive Status  Within Functional Limits for tasks assessed      Observation/Other Assessments   Focus on Therapeutic Outcomes (FOTO)   NT due to MCD       Sensation   Light Touch  Appears Intact      Coordination   Gross Motor Movements are Fluid and  Coordinated  Not tested      Posture/Postural Control   Posture/Postural Control  Postural limitations    Postural Limitations  Rounded Shoulders;Forward head;Flexed trunk      AROM   Right Shoulder Extension  45 Degrees    Right Shoulder Flexion  135 Degrees    Right Shoulder ABduction  145 Degrees    Right Shoulder Internal Rotation  --   FR to mid back    Right Shoulder External Rotation  --   FR to T1    Left Shoulder Extension  40 Degrees    Left Shoulder Flexion  105 Degrees   pain    Left Shoulder ABduction  90 Degrees   severe pain    Left Shoulder Internal Rotation  --   FR to L hip    Left Shoulder External Rotation  --   Fr to side of head      PROM   Overall PROM Comments  limited due to pain in abduction, flexion and external rotation       Strength   Left Shoulder Flexion  3-/5    Left Shoulder ABduction  2+/5    Left Shoulder Internal Rotation  4-/5    Left Shoulder External Rotation  3+/5      Palpation   Palpation comment  not well tolerated       Special Tests   Other special tests  NT due to MRI report available         Objective measurements completed on examination: See above findings.     PT Education - 01/09/19 1632    Education Details  HEP for AAROM, PT/POC , pillow for propping    Person(s) Educated  Patient    Methods  Explanation;Demonstration;Handout    Comprehension  Verbalized understanding;Returned demonstration        Plan - 01/09/19 1641    Clinical Impression Statement  Patient presents for low complexity eval of L shoulder pain following a fall in August. She has had pain consistently since then.  Her MRI did reveal a  full thickness rotator cuff tear. MD has not explained results yet per patient.  She has notable pain, decreased AROM, weakness and limitations in functional use of L UE.  Progress may be limited by her ability to tolerate PT given her rotator cuff tear. Time for eval was limited and so we did not get to her Rt  hip but that was secondary to her L UE pain. She will benefit from PT for L shoulder to include gentle ROM and modalities for pain.    Personal Factors and Comorbidities  Comorbidity 1;Time since onset of injury/illness/exacerbation;Past/Current Experience    Comorbidities  cardiac, chest pain, diabetes    Examination-Activity Limitations  Bathing;Reach Overhead;Sit;Hygiene/Grooming;Sleep;Lift;Carry;Bed Mobility;Dressing    Examination-Participation Restrictions  Interpersonal Relationship;Cleaning;Community Activity;Meal Prep;Shop    Stability/Clinical Decision Making  Stable/Uncomplicated    Clinical Decision Making  Low    Rehab Potential  Good    PT Frequency  1x / week    PT Duration  3 weeks   then 2 x 6 weeks if progressing   PT Treatment/Interventions  ADLs/Self Care Home Management;Cryotherapy;Iontophoresis 4mg /ml Dexamethasone;Electrical Stimulation;Moist Heat;Ultrasound;Patient/family education;Neuromuscular re-education;Functional mobility training;Passive range of motion;Taping;Therapeutic exercise    PT Next Visit Plan  AAROM, PROM, teach pendulums modalities for pain, ionto    PT Home Exercise Plan  AAROM: supine cane, seated table slides    Consulted and Agree with Plan of Care  Patient       Patient will benefit from skilled therapeutic intervention in order to improve the following deficits and impairments:  Decreased mobility, Hypomobility, Obesity, Decreased range of motion, Decreased activity tolerance, Increased fascial restricitons, Impaired flexibility, Impaired UE functional use, Postural dysfunction, Pain  Visit Diagnosis: Chronic left shoulder pain  Muscle weakness (generalized)  Stiffness of left shoulder, not elsewhere classified     Problem List Patient Active Problem List   Diagnosis Date Noted  . Need for immunization against influenza 10/30/2017  . 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 VSD (ventricular septal defect), perimembranous  05/29/2013  . Encounter for screening colonoscopy 11/09/2011  . Essential hypertension 08/10/2011  . Hyperlipidemia 10/11/2007  . Hearing loss 11/10/2005  . Asthma 11/10/2005  . GERD 11/10/2005  . Diabetes ([place]) 04/05/2002    Janice Massey 01/09/2019, 7:48 PM  [facility] [address] [place], [place], [address] Phone: [phone]   Fax:  [phone]  Name: Janice Massey MRN: [id number] Date of Birth: 07-03-1955   Raeford Razor, PT 01/09/19 7:49 PM Phone: [phone] Fax: [phone]

## 2019-01-17 ENCOUNTER — Ambulatory Visit (INDEPENDENT_AMBULATORY_CARE_PROVIDER_SITE_OTHER): Payer: Medicaid Other | Admitting: Family Medicine

## 2019-01-17 ENCOUNTER — Encounter: Payer: Self-pay | Admitting: Family Medicine

## 2019-01-17 ENCOUNTER — Other Ambulatory Visit: Payer: Self-pay

## 2019-01-17 VITALS — BP 124/72 | Ht 67.0 in | Wt 180.0 lb

## 2019-01-17 DIAGNOSIS — M25512 Pain in left shoulder: Secondary | ICD-10-CM

## 2019-01-17 NOTE — Progress Notes (Signed)
Patient came in today with sign language interpreter to go over MRI results.  She has a large full thickness rotator cuff tear of anterior supraspinatus though without atrophy.  Will go ahead with referral to ortho to discuss repair.

## 2019-01-17 NOTE — Patient Instructions (Addendum)
We will refer you to Dr. Mardelle Matte for your rotator cuff tear. He is with [facility] [address] [place]  Appointment: Friday 01/19/19 @ 4:15 pm. Please arrive at 4 pm

## 2019-01-19 DIAGNOSIS — S46012A Strain of muscle(s) and tendon(s) of the rotator cuff of left shoulder, initial encounter: Secondary | ICD-10-CM | POA: Diagnosis not present

## 2019-01-22 ENCOUNTER — Telehealth (HOSPITAL_COMMUNITY): Payer: Self-pay

## 2019-01-22 NOTE — Telephone Encounter (Signed)
Telephoned patient at home number. Left a message to call and schedule with [facility]. 

## 2019-01-23 ENCOUNTER — Other Ambulatory Visit: Payer: Self-pay | Admitting: Internal Medicine

## 2019-01-23 NOTE — Progress Notes (Signed)
01/31/2019 Janice Massey   August 20, 1955  [id number]  Primary Physician Delice Bison, DO Primary Cardiologist: Dr. Johnsie Cancel   Reason for Visit/CC:  VSD  HPI: 64 y.o. history of restrictive VSD. Last TTE October 2018 normal RV size and function no pulmonary HTN.  No history of CAD with normal myovue study without ischemia. EF was 62%. Additional past medical history is also significant for diabetes, hypertension and hyperlipidemia. She is on appropriate medical therapy and reports full daily compliance.   [name] CAP with [place] today for signing   She is a twin.  Granddaughter living with her Has other family in town Sign language interpreter with her today  BS poorly controlled followed by [facility] Last A1c 7.7 Invokana increased on 10/30/17  No cardiac complaints [place] and hurt left shoulder Needs arthroscopic surgery with Dr Anice Paganini I told her this would be fine Gven VSD I would prefer SBE prophylaxis but I suspect antibiotics Will be given routinely   Current Outpatient Medications  Medication Sig Dispense Refill  . aspirin 81 MG chewable tablet Chew 81 mg by mouth daily.    Marland Kitchen atorvastatin (LIPITOR) 40 MG tablet TAKE 1 TABLET(40 MG) BY MOUTH DAILY 90 tablet 1  . cetirizine (ZYRTEC) 10 MG tablet TAKE 1 TABLET(10 MG) BY MOUTH DAILY 30 tablet 3  . diltiazem (CARDIZEM CD) 120 MG 24 hr capsule TAKE 1 CAPSULE(120 MG) BY MOUTH DAILY 90 capsule 1  . hydrochlorothiazide (HYDRODIURIL) 25 MG tablet TAKE 1 TABLET(25 MG) BY MOUTH DAILY 30 tablet 5  . INVOKANA 100 MG TABS tablet TAKE 1 TABLET(100 MG) BY MOUTH DAILY BEFORE BREAKFAST 90 tablet 1  . losartan (COZAAR) 100 MG tablet Take 1 tablet (100 mg total) by mouth daily. 90 tablet 2  . metFORMIN (GLUCOPHAGE) 1000 MG tablet TAKE 1 TABLET(1000 MG) BY MOUTH TWICE DAILY 180 tablet 1  . omeprazole (PRILOSEC) 40 MG capsule Take 1 capsule (40 mg total) by mouth daily. 30 capsule 3   No current facility-administered medications for this  visit.    Allergies  Allergen Reactions  . Codeine Itching  . Lisinopril Cough  . Penicillins Itching    Has patient had a PCN reaction causing immediate rash, facial/tongue/throat swelling, SOB or lightheadedness with hypotension: YES Has patient had a PCN reaction causing severe rash involving mucus membranes or skin necrosis: NO Has patient had a PCN reaction that required hospitalization NO Has patient had a PCN reaction occurring within the last 10 years: NO If all of the above answers are "NO", then may proceed with Cephalosporin use.    Social History   Socioeconomic History  . Marital status: Single    Spouse name: Not on file  . Number of children: 1  . Years of education: Not on file  . Highest education level: Not on file  Occupational History    Employer: UNEMPLOYED  Tobacco Use  . Smoking status: Never Smoker  . Smokeless tobacco: Never Used  Substance and Sexual Activity  . Alcohol use: No    Alcohol/week: 0.0 standard drinks  . Drug use: No  . Sexual activity: Never    Birth control/protection: Surgical  Other Topics Concern  . Not on file  Social History Narrative  . Not on file   Social Determinants of Health   Financial Resource Strain:   . Difficulty of Paying Living Expenses: Not on file  Food Insecurity:   . Worried About Charity fundraiser in the Last Year: Not on  file  . [place] in the Last Year: Not on file  Transportation Needs:   . Lack of Transportation (Medical): Not on file  . Lack of Transportation (Non-Medical): Not on file  Physical Activity:   . Days of Exercise per Week: Not on file  . Minutes of Exercise per Session: Not on file  Stress:   . Feeling of Stress : Not on file  Social Connections:   . Frequency of Communication with Friends and Family: Not on file  . Frequency of Social Gatherings with Friends and Family: Not on file  . Attends Religious Services: Not on file  . Active Member of Clubs or Organizations:  Not on file  . Attends Archivist Meetings: Not on file  . Marital Status: Not on file  Intimate Partner Violence:   . Fear of Current or Ex-Partner: Not on file  . Emotionally Abused: Not on file  . Physically Abused: Not on file  . Sexually Abused: Not on file     Review of Systems: General: negative for chills, fever, night sweats or weight changes.  Cardiovascular: negative for chest pain, dyspnea on exertion, edema, orthopnea, palpitations, paroxysmal nocturnal dyspnea or shortness of breath Dermatological: negative for rash Respiratory: negative for cough or wheezing Urologic: negative for hematuria Abdominal: negative for nausea, vomiting, diarrhea, bright red blood per rectum, melena, or hematemesis Neurologic: negative for visual changes, syncope, or dizziness All other systems reviewed and are otherwise negative except as noted above.    Blood pressure 120/68, pulse 99, height 5\' 7"  (1.702 m), weight 198 lb (89.8 kg), SpO2 95 %.   Affect appropriate Overweight black female  HEENT: Deaf  Neck supple with no adenopathy JVP normal no bruits no thyromegaly Lungs clear with no wheezing and good diaphragmatic motion Heart:  S1/S2 no murmur, no rub, gallop or click PMI normal Abdomen: benighn, BS positve, no tenderness, no AAA no bruit.  No HSM or HJR Distal pulses intact with no bruits No edema Neuro non-focal Skin warm and dry No muscular weakness   EKG NSR. Non significant inferolateral Q waves  11/24/17  SR rate 84 inferior lateral Q;s not pathologic  ASSESSMENT AND PLAN:   1. Chest Pain:  Resolved normal myovue 2015 and 2016    2. HTN: followed by PCP. Controlled on HCTZ and Cozaar   3. HLD: on statin.  4. DM: followed by [facility] Invokana increased October 2019 A1c 7.7 discussed low carb diet   5. VSD:  Restrictive with no signs of pulmonary hypertension f/u echo 05/24/18 reviewed And stable with good RV/LV function and no  pulmonary hypertension F/U TTE May 2021   6. 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 Partially patent ductus reported on CT 03/05/18 She has no ductus murmur will order cardiac gated CTA March to assess aneurysm and ? PDA   7. Preoperative:  Ok to have shoulder surgery with Dr Basilio Cairo at anytime will need SBE prophylaxis for this   Jenkins Rouge

## 2019-01-24 ENCOUNTER — Ambulatory Visit (INDEPENDENT_AMBULATORY_CARE_PROVIDER_SITE_OTHER): Payer: No Typology Code available for payment source | Admitting: Family Medicine

## 2019-01-24 ENCOUNTER — Other Ambulatory Visit: Payer: Self-pay

## 2019-01-24 ENCOUNTER — Encounter: Payer: Self-pay | Admitting: Family Medicine

## 2019-01-24 VITALS — BP 116/64 | HR 77 | Wt 177.0 lb

## 2019-01-24 DIAGNOSIS — H539 Unspecified visual disturbance: Secondary | ICD-10-CM

## 2019-01-24 DIAGNOSIS — H9201 Otalgia, right ear: Secondary | ICD-10-CM

## 2019-01-24 MED ORDER — FLUTICASONE PROPIONATE 50 MCG/ACT NA SUSP
2.0000 | Freq: Every day | NASAL | 2 refills | Status: DC
Start: 2019-01-24 — End: 2019-08-07

## 2019-01-24 NOTE — Patient Instructions (Signed)
Thank you for coming in today.  Please use Flonase 1-2 sprays in each nostril every day until we follow-up  Follow up on 02/02/19 for further evaluation if no improvement. If resolves, you may cancel. Please return sooner if new symptoms.  Take care Dr. Mauri Reading

## 2019-01-24 NOTE — Progress Notes (Signed)
Subjective:   Patient ID: Brandi Mendez    DOB: 09-15-1955, 64 y.o. female   MRN: [id number]  Brandi Mendez is a 64 y.o. female with a HLD, obesity here for right ear pain  Right ear pain: Patient notes right ear pain x 5 days, described as constant. Denies any ear discharge. Denies any fevers, chills, rhinorrhea, congestion, sore throat. Denies any hearing loss. Does endorse occasional tinnitus. Denies any pain when she eats. Notes some spots in her eyes occasionally. She thinks she needs to see an eye doctor as it has been a long time since she has seen one. Denies any recent infections. Denies any jaw pain. Does endorse occasional submandibular pain. Denies ear fullness. Denies any rashes or lesions.  Review of Systems:  Per HPI.   PMFSH, medications and smoking status reviewed.  Objective:   BP 116/64   Pulse 77   Wt 177 lb (80.3 kg)   SpO2 94%   BMI 28.57 kg/m  Vitals and nursing note reviewed.  General: pleasant older lady, well nourished, well developed, in no acute distress with non-toxic appearance, sitting comfortably in exam chair  HEENT: Throat: nonerythematous without any exudate, Mouth: no perioral or mucosal ulcers/lesions, oropharynx clear without erythema or exudate, no signs of infection, no sublingual swelling, Ears: Right TM with low level of clear fluid level, small tan material located in top left corner, no eyrhtema or bulging, external canal WNL, left TM with clear fluid level, less than the right, small tan material located in top left corner, no erythema or bulging, external canal WNL, no vesicles or lesions noted on inspecption of external ear canals bilaterally, Head: no pain at TMJ bilaterally, no pain at mastoid process bilaterally, no pain with movement of ears bilaterally, Nose: swollen pink and moist turbinates R>L, Eyes: EOMI, PERRL Neck: normal ROM,  L>R submandibular and cervical lymphadenopaty, negative supraclavicular nodes CV: regular rate and rhythm  without murmurs, rubs, or gallops Lungs: clear to auscultation bilaterally with normal work of breathing Abdomen: soft, non-tender, non-distended Skin: warm, dry Extremities: warm and well perfused MSK: gait normal Neuro: Alert and oriented, speech normal  Hearing Exam: Bilaterally decreased hearing, symmetric and equal (2000 of 4000db)  Assessment & Plan:   Otalgia of right ear Unclear etiology for unilateral right ear pain. Highest differential at this time includes eustachian tube dysfunction although history does not quite fit this as well. No signs of infection to indicate AOM or AOE. No pain at mastoid or TMJ to indicate those as pathologies. No signs of dental infection to indicate referred pain. No obvious lesions present in ear canal to indicate herpes zoster infection however it may be too early to see. Denies hearing loss and hearing exam notable for symmetric decreased hearing at 2000db.  Less likely minerears/labrinthitis given lack of other symptoms. Acoustic neuroma is possible but lower on differential at this time. Plan for patient to optimize sinus regimen and follow up on 1/29 if no improvement. She was instructed to cancel if symptoms resolve.   Vision changes Patient notes seeing "black specs" occasionally. Has not been to an eye doctor in years. Notes she is wearing old prescription glasses.  - Ambulatory referral to eye doctor placed today  Orders Placed This Encounter  Procedures  . Ambulatory referral to Ophthalmology    Referral Priority:   Routine    Referral Type:   Consultation    Referral Reason:   Specialty Services Required    Requested Specialty:  Ophthalmology    Number of Visits Requested:   1   Meds ordered this encounter  Medications  . fluticasone (FLONASE) 50 MCG/ACT nasal spray    Sig: Place 2 sprays into both nostrils daily.    Dispense:  9.9 mL    Refill:  2   Mina Marble, DO PGY-2, [facility] 01/31/2019 11:36 AM

## 2019-01-25 ENCOUNTER — Ambulatory Visit: Payer: Medicaid Other | Admitting: Physical Therapy

## 2019-01-25 ENCOUNTER — Encounter: Payer: Self-pay | Admitting: Physical Therapy

## 2019-01-25 ENCOUNTER — Other Ambulatory Visit: Payer: Self-pay

## 2019-01-25 DIAGNOSIS — G8929 Other chronic pain: Secondary | ICD-10-CM

## 2019-01-25 DIAGNOSIS — M25612 Stiffness of left shoulder, not elsewhere classified: Secondary | ICD-10-CM

## 2019-01-25 DIAGNOSIS — M25512 Pain in left shoulder: Secondary | ICD-10-CM

## 2019-01-25 DIAGNOSIS — M6281 Muscle weakness (generalized): Secondary | ICD-10-CM

## 2019-01-25 NOTE — Therapy (Addendum)
[place], [place], [address] Phone: [phone]   Fax:  [phone]  Physical Therapy Treatment/Discharge  Patient Details  Name: Janice Massey MRN: [id number] Date of Birth: 26-Apr-1955 Referring Provider (PT): Dr. Karlton Lemon    Encounter Date: 01/25/2019  PT End of Session - 01/25/19 1526    Visit Number  2    Number of Visits  4    Date for PT Re-Evaluation  02/06/19    Authorization Type  MCD    PT Start Time  1450    PT Stop Time  1507    PT Time Calculation (min)  17 min    Activity Tolerance  Patient limited by pain    Behavior During Therapy  [facility] for tasks assessed/performed       Past Medical History:  Diagnosis Date  . Atypical chest pain    myoview 10/29/10 - Post-stress EF 56%. Low risk and negative for ischemia   . Back pain   . Back pain   . 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 Chronic PID   . Chronic PID (chronic pelvic inflammatory disease)   . Deaf    Since age 64  . Diabetes mellitus   . Dizziness   . Dyslipidemia   . Dyspnea   . History of Doppler ultrasound 06/20/09   Normal, no prior studies for comparison.   . Hypertension   . Lightheadedness   . Mute   . Obese   . URI (upper respiratory infection)   . Ventricular septal defect 2004 per echo   small membranous VSD    Past Surgical History:  Procedure Laterality Date  . CARDIAC CATHETERIZATION  08/23/95  . CHOLECYSTECTOMY  11/04/1993  . COLPOSCOPY    . TUBAL LIGATION  10/05/1978    There were no vitals filed for this visit.  Subjective Assessment - 01/25/19 1455    Subjective  Patient is have 8/10-9/10 pain today.  Donnald Garre been trying to move it and keep it going but it hurts. Waiting for surgery to be scheduled.    Diagnostic tests  MRI 01/03/19: Moderate tendinosis of the supraspinatus tendon with a largefull-thickness tear of the anterior half of the  supraspinatus tendon.    Currently in Pain?  Yes    Pain Score  8     Pain Location  Shoulder    Pain Orientation  Left    Pain Descriptors / Indicators  Burning;Shooting;Sore    Pain Onset  More than a month ago    Pain Frequency  Constant    Pain Relieving Factors  nothing    Effect of Pain on Daily Activities  limited in all aspects         [facility] PT Assessment - 01/25/19 0001      Assessment   Medical Diagnosis  6        [facility] Adult PT Treatment/Exercise - 01/25/19 0001      Shoulder Exercises: Supine   Other Supine Exercises  supine cane x 10 : chest press and overhead x 10     Other Supine Exercises  ER x 10 with cane       Shoulder Exercises: ROM/Strengthening   Pendulum  mod cues, all directions        PT Short Term Goals - 01/25/19 1526      PT SHORT TERM GOAL #1   Title  Pt will be  I with initial HEP for AAROM of L shoulder    Baseline  met for those given    Status  Achieved      PT SHORT TERM GOAL #2   Title  Pt will be able to lift arm to shoudler height with pain improved to moderate (5/10)    Status  Not Met      PT SHORT TERM GOAL #3   Title  Pt will be able to report use of pillows, ice.heat for improved rest at home.    Status  Not Met               Plan - 01/25/19 1456    Clinical Impression Statement  Patient will be having surgery by Dr. Mardelle Matte. Provided self xare, education about what to expect, protocol will limit what she can do activey, donning shirt and pendulums.  She may return after surgery but will cancel her remaining appts.    PT Treatment/Interventions  ADLs/Self Care Home Management;Cryotherapy;Iontophoresis 59m/ml Dexamethasone;Electrical Stimulation;Moist Heat;Ultrasound;Patient/family education;Neuromuscular re-education;Functional mobility training;Passive range of motion;Taping;Therapeutic exercise    PT Next Visit Plan  DC from PT as she will be having surgery.    PT Home Exercise Plan  AAROM: supine cane, seated  table slides, scapular squeeze    Recommended Other Services  may return after surgery       Patient will benefit from skilled therapeutic intervention in order to improve the following deficits and impairments:  Decreased mobility, Hypomobility, Obesity, Decreased range of motion, Decreased activity tolerance, Increased fascial restricitons, Impaired flexibility, Impaired UE functional use, Postural dysfunction, Pain  Visit Diagnosis: Chronic left shoulder pain  Muscle weakness (generalized)  Stiffness of left shoulder, not elsewhere classified     Problem List Patient Active Problem List   Diagnosis Date Noted  . Need for immunization against influenza 10/30/2017  . 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 VSD (ventricular septal defect), perimembranous 05/29/2013  . Encounter for screening colonoscopy 11/09/2011  . Essential hypertension 08/10/2011  . Hyperlipidemia 10/11/2007  . Hearing loss 11/10/2005  . Asthma 11/10/2005  . GERD 11/10/2005  . Diabetes ([place] 04/05/2002    Basel Defalco 01/25/2019, 3:30 PM  [facility] [address]Phone: [phone]  Fax:  [phone] Name: Janice KOENIGMRN: [id number] of Birth: 102-Nov-1957 JRaeford Razor PT 01/25/19 3:30 PM Phone: [phone]: [phone]  PHYSICAL THERAPY DISCHARGE SUMMARY  Visits from Start of Care: 2  Current functional level related to goals / functional outcomes: See above    Remaining deficits: Unknown   Education / Equipment: Having surgery per our last visit.  Plan: Patient agrees to discharge.  Patient goals were not met. Patient is being discharged due to not returning since the last visit.  ?????    Patient with severe pain , did not tolerate PT and stated she was having surgery.  JRaeford Razor PT 03/12/19 8:05 AM Phone: [phone]: [phone]

## 2019-01-30 ENCOUNTER — Ambulatory Visit: Payer: Medicaid Other | Admitting: Physical Therapy

## 2019-01-30 ENCOUNTER — Telehealth: Payer: Self-pay | Admitting: *Deleted

## 2019-01-30 NOTE — Telephone Encounter (Signed)
   [facility] Pre-operative Risk Assessment    Request for surgical clearance:  1. What type of surgery is being performed? LEFT SHOULDER SCOPE AND ROTATOR CUFF REPAIR   2. When is this surgery scheduled? TBD   3. What type of clearance is required (medical clearance vs. Pharmacy clearance to hold med vs. Both)? MEDICAL  4. Are there any medications that need to be held prior to surgery and how long? ASA   5. Practice name and name of physician performing surgery? [place]; [place]   6. What is your office phone number [phone] EXT [phone]    7.   What is your office fax number [phone]  8.   Anesthesia type (None, local, MAC, general) ?  CHOICE   Julaine Hua 01/30/2019, 5:36 PM  _________________________________________________________________   (provider comments below)

## 2019-01-31 ENCOUNTER — Encounter: Payer: Self-pay | Admitting: Cardiovascular Disease

## 2019-01-31 ENCOUNTER — Ambulatory Visit: Payer: Medicaid Other | Admitting: Cardiovascular Disease

## 2019-01-31 ENCOUNTER — Other Ambulatory Visit: Payer: Self-pay

## 2019-01-31 VITALS — BP 120/68 | HR 99 | Ht 67.0 in | Wt 198.0 lb

## 2019-01-31 DIAGNOSIS — Q250 Patent ductus arteriosus: Secondary | ICD-10-CM

## 2019-01-31 DIAGNOSIS — Q210 Ventricular septal defect: Secondary | ICD-10-CM | POA: Diagnosis not present

## 2019-01-31 DIAGNOSIS — Z01812 Encounter for preprocedural laboratory examination: Secondary | ICD-10-CM | POA: Diagnosis not present

## 2019-01-31 DIAGNOSIS — Q2542 Hypoplasia of aorta: Secondary | ICD-10-CM | POA: Diagnosis not present

## 2019-01-31 DIAGNOSIS — H9201 Otalgia, right ear: Secondary | ICD-10-CM | POA: Insufficient documentation

## 2019-01-31 DIAGNOSIS — H539 Unspecified visual disturbance: Secondary | ICD-10-CM | POA: Insufficient documentation

## 2019-01-31 NOTE — Telephone Encounter (Signed)
Ok to hold asa for 5-7 days

## 2019-01-31 NOTE — Telephone Encounter (Signed)
Can send my office note to [facility] to clear for surgery

## 2019-01-31 NOTE — Telephone Encounter (Signed)
Dr Kyla Balzarine note also mentions "I told her this would be fine Gven VSD I would prefer SBE prophylaxis but I suspect antibiotics Will be given routinely"  Pre op antibiotics are not typically given for shoulder surgery - would clarify with Raliegh Ip if they are giving pt any antibiotics pre/perioperatively as this should be sufficient if so. If they are not, could give clindamycin 600mg  before procedure since pt has PCN allergy.  Request is to hold aspirin - does not look as though this has been addressed yet.

## 2019-01-31 NOTE — Patient Instructions (Addendum)
Medication Instructions:  *If you need a refill on your cardiac medications before your next appointment, please call your pharmacy*  Lab Work: Your physician recommends that you return for lab work in: BMET in March or right before CT.  If you have labs (blood work) drawn today and your tests are completely normal, you will receive your results only by: Marland Kitchen MyChart Message (if you have MyChart) OR . A paper copy in the mail If you have any lab test that is abnormal or we need to change your treatment, we will call you to review the results.  Testing/Procedures: Your physician has requested that you have an echocardiogram in May. Echocardiography is a painless test that uses sound waves to create images of your heart. It provides your doctor with information about the size and shape of your heart and how well your heart's chambers and valves are working. This procedure takes approximately one hour. There are no restrictions for this procedure.  Your physician has requested that you have cardiac CT in March. Cardiac computed tomography (CT) is a painless test that uses an x-ray machine to take clear, detailed pictures of your heart. For further information please visit [email or web address]. Please follow instruction sheet as given.  Follow-Up: At [facility], you and your health needs are our priority.  As part of our continuing mission to provide you with exceptional heart care, we have created designated Provider Care Teams.  These Care Teams include your primary Cardiologist (physician) and Advanced Practice Providers (APPs -  Physician Assistants and Nurse Practitioners) who all work together to provide you with the care you need, when you need it.  Your next appointment:   May  The format for your next appointment:   In Person  Provider:   You may see Jenkins Rouge, MD or one of the following Advanced Practice Providers on your designated Care Team:    Truitt Merle, NP  Cecilie Kicks, NP  Kathyrn Drown, NP

## 2019-01-31 NOTE — Telephone Encounter (Signed)
Per office visit note  "7. Preoperative:  Ok to have shoulder surgery with Dr Basilio Cairo at anytime will need SBE prophylaxis for this "  Pharmacy staff, can u review SBE prophylasix? She is allergic to penicillins.

## 2019-01-31 NOTE — Telephone Encounter (Signed)
Dr. Johnsie Cancel, Is it okay to hold ASA for 5-7 days prior ?

## 2019-01-31 NOTE — Assessment & Plan Note (Signed)
Patient notes seeing "black specs" occasionally. Has not been to an eye doctor in years. Notes she is wearing old prescription glasses.  - Ambulatory referral to eye doctor placed today

## 2019-01-31 NOTE — Assessment & Plan Note (Signed)
Unclear etiology for unilateral right ear pain. Highest differential at this time includes eustachian tube dysfunction although history does not quite fit this as well. No signs of infection to indicate AOM or AOE. No pain at mastoid or TMJ to indicate those as pathologies. No signs of dental infection to indicate referred pain. No obvious lesions present in ear canal to indicate herpes zoster infection however it may be too early to see. Denies hearing loss and hearing exam notable for symmetric decreased hearing at 2000db.  Less likely minerears/labrinthitis given lack of other symptoms. Acoustic neuroma is possible but lower on differential at this time. Plan for patient to optimize sinus regimen and follow up on 1/29 if no improvement. She was instructed to cancel if symptoms resolve.

## 2019-02-02 ENCOUNTER — Ambulatory Visit: Payer: No Typology Code available for payment source | Admitting: Family Medicine

## 2019-02-02 NOTE — Telephone Encounter (Signed)
Pre-op covering staff, can you please clarify with requesting office if they are planning on giving patient any antibiotics pre/perioperatively? Patient has VSD so Dr. Johnsie Cancel suggest SBE prophylaxis. Per pharmacy, if surgeon is already planning on giving antibiotics this should be sufficient. However, if note, recommended Clindamycin 600mg  before procedure since patient has PCN allergy.  Thank you! Murice Barbar

## 2019-02-02 NOTE — Telephone Encounter (Signed)
Per pre op team:  Pre-op covering staff, can you please clarify with requesting office if they are planning on giving patient any antibiotics pre/perioperatively? Patient has VSD so Dr. Johnsie Cancel suggest SBE prophylaxis. Per pharmacy, if surgeon is already planning on giving antibiotics this should be sufficient. However, if note, recommended Clindamycin 600mg  before procedure since patient has PCN allergy.  Thank you! Callie   Sherri surgery scheduler is out of the office today and to please call [employer]. I left message for Claiborne Billings stating the above question per Sande Rives, PAC. Left message to please call back to please let us know if Dr. Mardelle Matte will be ordering SBE .

## 2019-02-04 ENCOUNTER — Other Ambulatory Visit: Payer: Self-pay | Admitting: Internal Medicine

## 2019-02-05 NOTE — Telephone Encounter (Signed)
Left message for Janice Massey to please call back in regards to if Dr. Mardelle Matte will be ordering SBE. Per Dr. Johnsie Cancel in clearance note pt needs SBE Clindamycin 600 mg before the procedure. Pt has allergy to PCN (penicillin). Will need call back if Dr. Mardelle Matte will be ordering the SBE or do they will need Dr. Johnsie Cancel to order the SBE.

## 2019-02-06 ENCOUNTER — Encounter: Payer: Medicaid Other | Admitting: Physical Therapy

## 2019-02-06 NOTE — Telephone Encounter (Signed)
Returned [employer] but got her voicemail. Left message asking her to call back.

## 2019-02-06 NOTE — Telephone Encounter (Signed)
Janice Massey is returning phone call.

## 2019-02-07 NOTE — Telephone Encounter (Signed)
   Primary Cardiologist: Jenkins Rouge, MD  Chart reviewed as part of pre-operative protocol coverage. Patient recently seen by Dr. Johnsie Cancel on 01/31/2019. Per note from that visit, "OK to have shoulder surgery with Dr. Mardelle Matte at anytime."    Per Dr. Johnsie Cancel, [place] to hold Aspirin for 5-7 days prior to shoulder surgery. Should be restarted as soon as able following procedure.  Also, given VSD, patient will need SBE prophylaxis. Per pharmacy, if surgeon is already planning on giving antibiotics this should be sufficient. However, if not, recommend Clindamycin 600mg  before procedure since patient has PCN allergy.  I will route this recommendation to the requesting party via Epic fax function and remove from pre-op pool.  Please call with questions.  Darreld Mclean, PA-C 02/07/2019, 1:26 PM

## 2019-02-07 NOTE — Telephone Encounter (Signed)
I s/w Sherri with Dr. Luanna Cole office. She states they will order the SBE for the pt. I thanked Sherri for her help and that I will let our pre op team know Dr. Mardelle Matte will order the SBE. I will remove from the pre op call back pool.

## 2019-02-10 ENCOUNTER — Ambulatory Visit (HOSPITAL_COMMUNITY)
Admission: EM | Admit: 2019-02-10 | Discharge: 2019-02-10 | Disposition: A | Discharge status: Home / Self Care | Payer: No Typology Code available for payment source | Attending: Emergency Medicine | Admitting: Emergency Medicine

## 2019-02-10 ENCOUNTER — Other Ambulatory Visit: Payer: Self-pay

## 2019-02-10 ENCOUNTER — Encounter (HOSPITAL_COMMUNITY): Payer: Self-pay | Admitting: *Deleted

## 2019-02-10 DIAGNOSIS — K047 Periapical abscess without sinus: Secondary | ICD-10-CM

## 2019-02-10 DIAGNOSIS — R220 Localized swelling, mass and lump, head: Secondary | ICD-10-CM

## 2019-02-10 MED ORDER — KETOROLAC TROMETHAMINE 60 MG/2ML IM SOLN
INTRAMUSCULAR | Status: AC
  Filled 2019-02-10: qty 2

## 2019-02-10 MED ORDER — LIDOCAINE HCL (PF) 1 % IJ SOLN
INTRAMUSCULAR | Status: AC
  Filled 2019-02-10: qty 2

## 2019-02-10 MED ORDER — IBUPROFEN 600 MG PO TABS: 600.0000 mg | ORAL_TABLET | Freq: Four times a day (QID) | ORAL | 0 refills | Status: DC | PRN

## 2019-02-10 MED ORDER — CEFTRIAXONE SODIUM 1 G IJ SOLR
INTRAMUSCULAR | Status: AC
  Filled 2019-02-10: qty 10

## 2019-02-10 MED ORDER — HYDROCODONE-ACETAMINOPHEN 5-325 MG PO TABS: 1.0000 | ORAL_TABLET | Freq: Four times a day (QID) | ORAL | 0 refills | Status: DC | PRN

## 2019-02-10 MED ORDER — KETOROLAC TROMETHAMINE 60 MG/2ML IM SOLN
60.0000 mg | Freq: Once | INTRAMUSCULAR | Status: AC
  Administered 2019-02-10: 60 mg via INTRAMUSCULAR

## 2019-02-10 MED ORDER — CEFTRIAXONE SODIUM 1 G IJ SOLR
1.0000 g | Freq: Once | INTRAMUSCULAR | Status: AC
  Administered 2019-02-10: 1 g via INTRAMUSCULAR

## 2019-02-10 MED ORDER — AMOXICILLIN-POT CLAVULANATE 875-125 MG PO TABS: 1.0000 | ORAL_TABLET | Freq: Two times a day (BID) | ORAL | 0 refills | Status: AC

## 2019-02-10 NOTE — Discharge Instructions (Signed)
We gave you an injection of Toradol for pain and Rocephin to begin treating the infection Continue with Augmentin twice daily for the next week Use anti-inflammatories for pain/swelling. You may take up to 800 mg Ibuprofen every 8 hours with food. You may supplement Ibuprofen with Tylenol [phone] mg every 8 hours.  May use hydrocodone for severe pain, use sparingly or mainly at bedtime for nighttime pain.  Will cause drowsiness, do not drive or work after taking.  Please follow-up with dentistry for further evaluation of teeth  If you are developing increased pain, swelling, pain with moving eye, fevers, dizziness, lightheadedness, difficulty swallowing or not improving with the antibiotic provided please follow-up in the emergency room

## 2019-02-10 NOTE — ED Triage Notes (Signed)
C/O starting with right upper tooth pain and swelling yesterday.  Today c/o right facial swelling.  Denies any fevers.  Has taken Tyl without relief.

## 2019-02-11 NOTE — ED Provider Notes (Signed)
[place]    CSN: [id number] Arrival date & time: 02/10/19  1351      History   Chief Complaint Chief Complaint  Patient presents with  . Dental Pain  . Facial Swelling    HPI Brandi Mendez is a 64 y.o. female presenting today for evaluation of dental pain and facial swelling.  Patient states that beginning yesterday she began to develop swelling to her right upper jaw.  Worsened today.  Is having severe pain associated with this.  Does report poor teeth as well as not seeing dentistry in a while.  She has been using Tylenol and Orajel without relief.  She denies any vision changes.  Does have some mild discomfort with moving eye downward.  Denies fevers.  Denies neck stiffness.  Denies difficulty swallowing.  HPI  Past Medical History:  Diagnosis Date  . Small bowel obstruction ([place]) 2013, 2015    Patient Active Problem List   Diagnosis Date Noted  . Otalgia of right ear 01/31/2019  . Vision changes 01/31/2019  . [place], MILD 12/26/2006  . OBESITY, UNSPECIFIED 12/26/2006    Past Surgical History:  Procedure Laterality Date  . ABDOMINAL HERNIA REPAIR    . ABDOMINAL HYSTERECTOMY     [facility]  . CHOLECYSTECTOMY    . Left ankle surgery      OB History   No obstetric history on file.      Home Medications    Prior to Admission medications   Medication Sig Start Date End Date Taking? Authorizing Provider  amoxicillin-clavulanate (AUGMENTIN) 875-125 MG tablet Take 1 tablet by mouth every 12 (twelve) hours for 7 days. 02/10/19 02/17/19  Samirah Scarpati C, PA-C  HYDROcodone-acetaminophen (NORCO/VICODIN) 5-325 MG tablet Take 1 tablet by mouth every 6 (six) hours as needed for severe pain. 02/10/19   Eily Louvier C, PA-C  ibuprofen (ADVIL) 600 MG tablet Take 1 tablet (600 mg total) by mouth every 6 (six) hours as needed. 02/10/19   Paisely Brick C, PA-C  fluticasone (FLONASE) 50 MCG/ACT nasal spray Place 2 sprays into both nostrils daily.  01/24/19 02/10/19  Danna Hefty, DO    Family History Family History  Problem Relation Age of Onset  . Cancer Mother        Breast? but patient not sure  . Diabetes Father   . Diabetes Sister   . Early death Brother 59       aneurysm  . Heart disease Maternal Grandmother   . Diabetes Paternal Grandfather     Social History Social History   Tobacco Use  . Smoking status: Never Smoker  . Smokeless tobacco: Never Used  Substance Use Topics  . Alcohol use: Yes    Comment: rarely  . Drug use: No     Allergies   Patient has no known allergies.   Review of Systems Review of Systems  Constitutional: Negative for activity change, appetite change, chills, fatigue and fever.  HENT: Positive for dental problem and facial swelling. Negative for congestion, ear pain, rhinorrhea, sinus pressure, sore throat and trouble swallowing.   Eyes: Negative for discharge and redness.  Respiratory: Negative for cough, chest tightness and shortness of breath.   Cardiovascular: Negative for chest pain.  Gastrointestinal: Negative for abdominal pain, diarrhea, nausea and vomiting.  Musculoskeletal: Negative for myalgias.  Skin: Negative for rash.  Neurological: Negative for dizziness, light-headedness and headaches.     Physical Exam Triage Vital Signs ED Triage Vitals  Enc Vitals Group  BP 02/10/19 1408 (!) 146/94     Pulse Rate 02/10/19 1408 65     Resp 02/10/19 1408 18     Temp 02/10/19 1408 (!) 97.5 F (36.4 C)     Temp Source 02/10/19 1408 Oral     SpO2 02/10/19 1408 99 %     Weight --      Height --      Head Circumference --      Peak Flow --      Pain Score 02/10/19 1410 10     Pain Loc --      Pain Edu? --      Excl. in GC? --    No data found.  Updated Vital Signs BP (!) 146/94   Pulse 65   Temp (!) 97.5 F (36.4 C) (Oral)   Resp 18   SpO2 99%   Visual Acuity Right Eye Distance:   Left Eye Distance:   Bilateral Distance:    Right Eye Near:   Left  Eye Near:    Bilateral Near:     Physical Exam Vitals and nursing note reviewed.  Constitutional:      General: She is not in acute distress.    Appearance: She is well-developed.  HENT:     Head: Normocephalic and atraumatic.     Comments: Facial swelling noted to right maxillary area, with overlying erythema and tenderness    Ears:     Comments: Right TM appears opaque and dull    Mouth/Throat:     Comments: Oral mucosa pink and moist, no tonsillar enlargement or exudate. Posterior pharynx patent and nonerythematous, no uvula deviation or swelling. Normal phonation.  Dentition in poor repair, right upper jaw with gingival erythema swelling and tenderness ; no soft palate swelling Eyes:     Extraocular Movements: Extraocular movements intact.     Conjunctiva/sclera: Conjunctivae normal.     Pupils: Pupils are equal, round, and reactive to light.  Neck:     Comments: No swelling noted to neck, nontender to palpation on your submandibular area or submental area Full active range of motion of neck, no lymphadenopathy Cardiovascular:     Rate and Rhythm: Normal rate and regular rhythm.     Heart sounds: No murmur.  Pulmonary:     Effort: Pulmonary effort is normal. No respiratory distress.     Breath sounds: Normal breath sounds.  Abdominal:     Palpations: Abdomen is soft.     Tenderness: There is no abdominal tenderness.  Musculoskeletal:     Cervical back: Neck supple.  Skin:    General: Skin is warm and dry.  Neurological:     Mental Status: She is alert.      UC Treatments / Results  Labs (all labs ordered are listed, but only abnormal results are displayed) Labs Reviewed - No data to display  EKG   Radiology No results found.  Procedures Procedures (including critical care time)  Medications Ordered in UC Medications  cefTRIAXone (ROCEPHIN) injection 1 g (1 g Intramuscular Given 02/10/19 1503)  ketorolac (TORADOL) injection 60 mg (60 mg Intramuscular Given  02/10/19 1503)    Initial Impression / Assessment and Plan / UC Course  I have reviewed the triage vital signs and the nursing notes.  Pertinent labs & imaging results that were available during my care of the patient were reviewed by me and considered in my medical decision making (see chart for details).    Patient appears to have dental/facial  abscess.  Likely secondary to poor dentition.  Recommending follow-up with dentistry.  Providing Toradol and Rocephin prior to discharge help with pain and initiate antibiotic therapy, continuing on Augmentin to cover oral bacteria as well as treat for right otitis.  Provided 2 days worth of hydrocodone to use for severe pain/nighttime pain.  Discussed drowsiness associated with this.  Use Tylenol and ibuprofen for mild to moderate pain.  Follow-up in emergency room if swelling worsening or not improving with oral antibiotics.  Discussed strict return precautions. Patient verbalized understanding and is agreeable with plan.   Final Clinical Impressions(s) / UC Diagnoses   Final diagnoses:  Dental abscess  Facial swelling     Discharge Instructions     We gave you an injection of Toradol for pain and Rocephin to begin treating the infection Continue with Augmentin twice daily for the next week Use anti-inflammatories for pain/swelling. You may take up to 800 mg Ibuprofen every 8 hours with food. You may supplement Ibuprofen with Tylenol [phone] mg every 8 hours.  May use hydrocodone for severe pain, use sparingly or mainly at bedtime for nighttime pain.  Will cause drowsiness, do not drive or work after taking.  Please follow-up with dentistry for further evaluation of teeth  If you are developing increased pain, swelling, pain with moving eye, fevers, dizziness, lightheadedness, difficulty swallowing or not improving with the antibiotic provided please follow-up in the emergency room   ED Prescriptions    Medication Sig Dispense Auth.  Provider   amoxicillin-clavulanate (AUGMENTIN) 875-125 MG tablet Take 1 tablet by mouth every 12 (twelve) hours for 7 days. 14 tablet Mikeria Valin C, PA-C   ibuprofen (ADVIL) 600 MG tablet Take 1 tablet (600 mg total) by mouth every 6 (six) hours as needed. 30 tablet Dwan Hemmelgarn C, PA-C   HYDROcodone-acetaminophen (NORCO/VICODIN) 5-325 MG tablet Take 1 tablet by mouth every 6 (six) hours as needed for severe pain. 6 tablet Lita Flynn, [place] C, PA-C     I have reviewed the PDMP during this encounter.   Lew Dawes, [place] 02/11/19 [phone]

## 2019-02-14 DIAGNOSIS — S46012D Strain of muscle(s) and tendon(s) of the rotator cuff of left shoulder, subsequent encounter: Secondary | ICD-10-CM | POA: Diagnosis not present

## 2019-02-19 ENCOUNTER — Encounter: Payer: Self-pay | Admitting: Internal Medicine

## 2019-02-19 ENCOUNTER — Ambulatory Visit: Payer: Medicaid Other | Admitting: Internal Medicine

## 2019-02-19 ENCOUNTER — Other Ambulatory Visit: Payer: Self-pay

## 2019-02-19 VITALS — BP 135/77 | HR 101 | Temp 98.4°F | Wt 197.2 lb

## 2019-02-19 DIAGNOSIS — Z7984 Long term (current) use of oral hypoglycemic drugs: Secondary | ICD-10-CM | POA: Diagnosis not present

## 2019-02-19 DIAGNOSIS — E1129 Type 2 diabetes mellitus with other diabetic kidney complication: Secondary | ICD-10-CM

## 2019-02-19 DIAGNOSIS — I10 Essential (primary) hypertension: Secondary | ICD-10-CM | POA: Diagnosis not present

## 2019-02-19 DIAGNOSIS — R809 Proteinuria, unspecified: Secondary | ICD-10-CM

## 2019-02-19 DIAGNOSIS — E119 Type 2 diabetes mellitus without complications: Secondary | ICD-10-CM

## 2019-02-19 DIAGNOSIS — Z79899 Other long term (current) drug therapy: Secondary | ICD-10-CM | POA: Diagnosis not present

## 2019-02-19 DIAGNOSIS — E1122 Type 2 diabetes mellitus with diabetic chronic kidney disease: Secondary | ICD-10-CM

## 2019-02-19 LAB — POCT GLYCOSYLATED HEMOGLOBIN (HGB A1C): Hemoglobin A1C: 8.6 % — AB (ref 4.0–5.6)

## 2019-02-19 LAB — GLUCOSE, CAPILLARY: Glucose-Capillary: 205 mg/dL — ABNORMAL HIGH (ref 70–99)

## 2019-02-19 MED ORDER — ASPIRIN 81 MG PO CHEW: 81.0000 mg | CHEWABLE_TABLET | Freq: Every day | ORAL | 3 refills | Status: DC

## 2019-02-19 MED ORDER — OMEPRAZOLE 40 MG PO CPDR: DELAYED_RELEASE_CAPSULE | ORAL | 5 refills | Status: DC

## 2019-02-19 MED ORDER — ATORVASTATIN CALCIUM 40 MG PO TABS: ORAL_TABLET | ORAL | 1 refills | Status: DC

## 2019-02-19 MED ORDER — HYDROCHLOROTHIAZIDE 25 MG PO TABS: ORAL_TABLET | ORAL | 5 refills | Status: DC

## 2019-02-19 MED ORDER — LOSARTAN POTASSIUM 100 MG PO TABS: 100.0000 mg | ORAL_TABLET | Freq: Every day | ORAL | 2 refills | Status: DC

## 2019-02-19 MED ORDER — CETIRIZINE HCL 10 MG PO TABS: ORAL_TABLET | ORAL | 3 refills | Status: DC

## 2019-02-19 MED ORDER — METFORMIN HCL 1000 MG PO TABS: ORAL_TABLET | ORAL | 1 refills | Status: DC

## 2019-02-19 MED ORDER — CANAGLIFLOZIN 300 MG PO TABS: 300.0000 mg | ORAL_TABLET | Freq: Every day | ORAL | 2 refills | Status: DC

## 2019-02-19 MED ORDER — DILTIAZEM HCL ER COATED BEADS 120 MG PO CP24: ORAL_CAPSULE | ORAL | 1 refills | Status: DC

## 2019-02-19 NOTE — Patient Instructions (Signed)
Janice Massey, It was great seeing you!  I'm sorry to hear about your fall and shoulder injury, but I'm glad the injection seems to be helping.  Today we discussed your diabetes. Unfortunately you A1C is up a bit to 8.6. Our goal is to get you back below 7. I am increasing your Invokana to 300 mg daily.   We will keep the rest of your medications the same.   I'll plan to see you back in 3 months.   Take care, Dr. Koleen Distance

## 2019-02-20 ENCOUNTER — Encounter: Payer: Self-pay | Admitting: Internal Medicine

## 2019-02-20 NOTE — Assessment & Plan Note (Signed)
Unfortunately, A1C worsened to 8.6, previously well controlled at 6.5. She states she has been a little more stressed moving in with her daughter which makes it hard for her to maintain dietary compliance. I offered nutritional services through [facility], but patient would prefer to manage with medication regimen alone. She is adamant about not giving herself injections. I would prefer to start an oral GLP1, but it is not covered by her insurance. Will try increasing Invokana to 300 mg daily. Continue Metformin 1000 mg BID. Follow-up in 3 months.

## 2019-02-20 NOTE — Assessment & Plan Note (Signed)
Blood pressure well controlled on Diltiazem, HCTZ and Losartan. Denies any symptoms consistent with orthostatic hypotension. Refills sent today.

## 2019-02-20 NOTE — Progress Notes (Signed)
   CC: HTN, Diabetes   HPI:  Janice Massey is a 64 y.o. female with PMHx listed below who presents for follow-up on HTN and Diabetes. Please see encounters tab for updates on her chronic medical conditions.   Past Medical History:  Diagnosis Date  . Atypical chest pain    myoview 10/29/10 - Post-stress EF 56%. Low risk and negative for ischemia   . Back pain   . Back pain   . 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 Chronic PID   . Chronic PID (chronic pelvic inflammatory disease)   . Deaf    Since age 6  . Diabetes mellitus   . Dizziness   . Dyslipidemia   . Dyspnea   . History of Doppler ultrasound 06/20/09   Normal, no prior studies for comparison.   . Hypertension   . Lightheadedness   . Mute   . Obese   . URI (upper respiratory infection)   . Ventricular septal defect 2004 per echo   small membranous VSD   Review of Systems:  Review of Systems  Constitutional: Negative for chills and fever.  HENT: Negative for sinus pain and sore throat.   Eyes: Negative for blurred vision.  Respiratory: Negative for cough and shortness of breath.   Cardiovascular: Negative for chest pain, palpitations and leg swelling.  Gastrointestinal: Negative for abdominal pain, nausea and vomiting.  Genitourinary: Negative for frequency and hematuria.  Musculoskeletal: Negative for falls.  Skin: Negative for rash.  Neurological: Negative for dizziness, sensory change, focal weakness and headaches.  Endo/Heme/Allergies: Negative for polydipsia.  Psychiatric/Behavioral: Negative for depression.    Physical Exam:  Vitals:   02/19/19 1434  BP: 135/77  Pulse: (!) 101  Temp: 98.4 F (36.9 C)  TempSrc: Oral  SpO2: 99%  Weight: 197 lb 3.2 oz (89.4 kg)   Physical Exam Constitutional:      General: She is not in acute distress.    Appearance: Normal appearance.  Eyes:     Conjunctiva/sclera: Conjunctivae normal.  Cardiovascular:   Rate and Rhythm: Normal rate and regular rhythm.     Pulses: Normal pulses.  Pulmonary:     Effort: Pulmonary effort is normal.     Breath sounds: Normal breath sounds.  Abdominal:     General: There is no distension.     Palpations: Abdomen is soft.     Tenderness: There is no abdominal tenderness.  Musculoskeletal:     Cervical back: Normal range of motion.     Right lower leg: No edema.     Left lower leg: No edema.  Lymphadenopathy:     Cervical: No cervical adenopathy.  Skin:    General: Skin is warm and dry.  Neurological:     General: No focal deficit present.     Mental Status: She is alert and oriented to person, place, and time.  Psychiatric:        Mood and Affect: Mood normal.        Behavior: Behavior normal.      Assessment & Plan:   See Encounters Tab for problem based charting.  Patient discussed with Dr. Philipp Ovens

## 2019-02-21 NOTE — Progress Notes (Signed)
Internal Medicine Clinic Attending  Case discussed with Dr. Bloomfield at the time of the visit.  We reviewed the resident's history and exam and pertinent patient test results.  I agree with the assessment, diagnosis, and plan of care documented in the resident's note.  

## 2019-03-05 ENCOUNTER — Ambulatory Visit
Admission: RE | Admit: 2019-03-05 | Discharge: 2019-03-05 | Disposition: A | Discharge status: Home / Self Care | Payer: No Typology Code available for payment source | Source: Ambulatory Visit | Attending: Family Medicine | Admitting: Family Medicine

## 2019-03-05 ENCOUNTER — Other Ambulatory Visit: Payer: Self-pay

## 2019-03-05 DIAGNOSIS — Z1239 Encounter for other screening for malignant neoplasm of breast: Secondary | ICD-10-CM

## 2019-03-08 ENCOUNTER — Other Ambulatory Visit: Payer: Self-pay | Admitting: Internal Medicine

## 2019-03-08 DIAGNOSIS — E1122 Type 2 diabetes mellitus with diabetic chronic kidney disease: Secondary | ICD-10-CM

## 2019-03-08 DIAGNOSIS — N183 Chronic kidney disease, stage 3 unspecified: Secondary | ICD-10-CM

## 2019-03-09 ENCOUNTER — Other Ambulatory Visit: Payer: Medicaid Other | Admitting: *Deleted

## 2019-03-09 ENCOUNTER — Other Ambulatory Visit: Payer: Self-pay

## 2019-03-09 DIAGNOSIS — Q210 Ventricular septal defect: Secondary | ICD-10-CM

## 2019-03-09 DIAGNOSIS — Z01812 Encounter for preprocedural laboratory examination: Secondary | ICD-10-CM | POA: Diagnosis not present

## 2019-03-09 DIAGNOSIS — Q250 Patent ductus arteriosus: Secondary | ICD-10-CM

## 2019-03-10 LAB — BASIC METABOLIC PANEL
BUN/Creatinine Ratio: 15 (ref 12–28)
BUN: 15 mg/dL (ref 8–27)
CO2: 25 mmol/L (ref 20–29)
Calcium: 9.8 mg/dL (ref 8.7–10.3)
Chloride: 98 mmol/L (ref 96–106)
Creatinine, Ser: 0.98 mg/dL (ref 0.57–1.00)
GFR calc Af Amer: 71 mL/min/{1.73_m2} (ref >59)
GFR calc non Af Amer: 62 mL/min/{1.73_m2} (ref >59)
Glucose: 176 mg/dL — ABNORMAL HIGH (ref 65–99)
Potassium: 3.5 mmol/L (ref 3.5–5.2)
Sodium: 142 mmol/L (ref 134–144)

## 2019-03-14 ENCOUNTER — Other Ambulatory Visit: Payer: Self-pay

## 2019-03-14 ENCOUNTER — Ambulatory Visit (HOSPITAL_COMMUNITY)
Admission: RE | Admit: 2019-03-14 | Discharge: 2019-03-14 | Disposition: A | Discharge status: Home / Self Care | Payer: Medicaid Other | Source: Ambulatory Visit | Attending: Cardiovascular Disease | Admitting: Cardiovascular Disease

## 2019-03-14 DIAGNOSIS — I712 Thoracic aortic aneurysm, without rupture, unspecified: Secondary | ICD-10-CM

## 2019-03-14 DIAGNOSIS — Z8679 Personal history of other diseases of the circulatory system: Secondary | ICD-10-CM | POA: Diagnosis not present

## 2019-03-14 MED ORDER — IOHEXOL 350 MG/ML SOLN
100.0000 mL | Freq: Once | INTRAVENOUS | Status: AC | PRN
  Administered 2019-03-14: 100 mL via INTRAVENOUS

## 2019-03-29 ENCOUNTER — Ambulatory Visit: Payer: Medicaid Other | Attending: Internal Medicine

## 2019-03-29 DIAGNOSIS — Z23 Encounter for immunization: Secondary | ICD-10-CM

## 2019-03-29 NOTE — Progress Notes (Signed)
   Covid-19 Vaccination Clinic  Name:  Janice Massey    MRN: [id number] DOB: Aug 20, 1955  03/29/2019  [name] was observed post Covid-19 immunization for 15 minutes without incident. She was provided with Vaccine Information Sheet and instruction to access the V-Safe system.   [name] was instructed to call 911 with any severe reactions post vaccine: Marland Kitchen Difficulty breathing  . Swelling of face and throat  . A fast heartbeat  . A bad rash all over body  . Dizziness and weakness   Immunizations Administered    Name Date Dose VIS Date Route   Pfizer COVID-19 Vaccine 03/29/2019 11:04 AM 0.3 mL 12/15/2018 Intramuscular   Manufacturer: [place]   Lot: [id number]   [place]: [id number]

## 2019-03-30 ENCOUNTER — Ambulatory Visit: Payer: No Typology Code available for payment source | Attending: Internal Medicine

## 2019-03-30 DIAGNOSIS — Z23 Encounter for immunization: Secondary | ICD-10-CM

## 2019-03-30 NOTE — Progress Notes (Signed)
   Covid-19 Vaccination Clinic  Name:  Brandi Mendez    MRN: [id number] DOB: [birth year]  03/30/2019  [name] was observed post Covid-19 immunization for 15 minutes without incident. She was provided with Vaccine Information Sheet and instruction to access the V-Safe system.   [name] was instructed to call 911 with any severe reactions post vaccine: Marland Kitchen Difficulty breathing  . Swelling of face and throat  . A fast heartbeat  . A bad rash all over body  . Dizziness and weakness   Immunizations Administered    Name Date Dose VIS Date Route   Pfizer COVID-19 Vaccine 03/30/2019 11:47 AM 0.3 mL 12/15/2018 Intramuscular   Manufacturer: [employer], [employer]   Lot: [id number]   ND[place]: [address]-8

## 2019-04-23 ENCOUNTER — Ambulatory Visit: Payer: No Typology Code available for payment source | Attending: Internal Medicine

## 2019-04-23 ENCOUNTER — Ambulatory Visit: Payer: Medicaid Other | Attending: Internal Medicine

## 2019-04-23 DIAGNOSIS — Z23 Encounter for immunization: Secondary | ICD-10-CM

## 2019-04-23 NOTE — Progress Notes (Signed)
   Covid-19 Vaccination Clinic  Name:  Janice Massey    MRN: [id number] DOB: [birth year]  04/23/2019  Janice Massey was observed post Covid-19 immunization for 15 minutes without incident. She was provided with Vaccine Information Sheet and instruction to access the V-Safe system.   Janice Massey was instructed to call 911 with any severe reactions post vaccine: Marland Kitchen Difficulty breathing  . Swelling of face and throat  . A fast heartbeat  . A bad rash all over body  . Dizziness and weakness   Immunizations Administered    Name Date Dose VIS Date Route   Pfizer COVID-19 Vaccine 04/23/2019 10:48 AM 0.3 mL 02/28/2018 Intramuscular   Manufacturer: [place]   Lot: [id number]   [place]: [id number]

## 2019-04-23 NOTE — Progress Notes (Signed)
   Covid-19 Vaccination Clinic  Name:  Brandi Mendez    MRN: [id number] DOB: Dec 15, 1955  04/23/2019  [name] was observed post Covid-19 immunization for 15 minutes without incident. She was provided with Vaccine Information Sheet and instruction to access the V-Safe system.   [name] was instructed to call 911 with any severe reactions post vaccine: Marland Kitchen Difficulty breathing  . Swelling of face and throat  . A fast heartbeat  . A bad rash all over body  . Dizziness and weakness   Immunizations Administered    Name Date Dose VIS Date Route   Pfizer COVID-19 Vaccine 04/23/2019 10:36 AM 0.3 mL 02/28/2018 Intramuscular   Manufacturer: [employer], [employer]   Lot: [id number]   NDC: [address]-4

## 2019-05-18 ENCOUNTER — Other Ambulatory Visit (HOSPITAL_COMMUNITY): Payer: Medicaid Other

## 2019-05-23 ENCOUNTER — Encounter: Payer: Self-pay | Admitting: Internal Medicine

## 2019-05-23 ENCOUNTER — Ambulatory Visit: Payer: Medicaid Other | Admitting: Internal Medicine

## 2019-05-23 ENCOUNTER — Other Ambulatory Visit: Payer: Self-pay

## 2019-05-23 VITALS — BP 114/81 | HR 111 | Temp 100.1°F | Wt 188.6 lb

## 2019-05-23 DIAGNOSIS — E1129 Type 2 diabetes mellitus with other diabetic kidney complication: Secondary | ICD-10-CM | POA: Diagnosis not present

## 2019-05-23 DIAGNOSIS — R809 Proteinuria, unspecified: Secondary | ICD-10-CM

## 2019-05-23 DIAGNOSIS — J019 Acute sinusitis, unspecified: Secondary | ICD-10-CM | POA: Diagnosis not present

## 2019-05-23 DIAGNOSIS — I10 Essential (primary) hypertension: Secondary | ICD-10-CM

## 2019-05-23 DIAGNOSIS — Z7984 Long term (current) use of oral hypoglycemic drugs: Secondary | ICD-10-CM

## 2019-05-23 LAB — GLUCOSE, CAPILLARY: Glucose-Capillary: 264 mg/dL — ABNORMAL HIGH (ref 70–99)

## 2019-05-23 LAB — POCT GLYCOSYLATED HEMOGLOBIN (HGB A1C): Hemoglobin A1C: 7.9 % — AB (ref 4.0–5.6)

## 2019-05-23 MED ORDER — OMEPRAZOLE 40 MG PO CPDR: DELAYED_RELEASE_CAPSULE | ORAL | 5 refills | Status: DC

## 2019-05-23 MED ORDER — FLUTICASONE PROPIONATE 50 MCG/ACT NA SUSP: 2.0000 | Freq: Every day | NASAL | 2 refills | Status: DC

## 2019-05-23 MED ORDER — HYDROCHLOROTHIAZIDE 25 MG PO TABS: ORAL_TABLET | ORAL | 5 refills | Status: DC

## 2019-05-23 MED ORDER — CETIRIZINE HCL 10 MG PO TABS: ORAL_TABLET | ORAL | 3 refills | Status: DC

## 2019-05-23 MED ORDER — CANAGLIFLOZIN 300 MG PO TABS: 300.0000 mg | ORAL_TABLET | Freq: Every day | ORAL | 2 refills | Status: DC

## 2019-05-23 MED ORDER — SITAGLIPTIN PHOSPHATE 100 MG PO TABS: 100.0000 mg | ORAL_TABLET | Freq: Every day | ORAL | 2 refills | Status: DC

## 2019-05-23 MED ORDER — DOXYCYCLINE HYCLATE 100 MG PO TABS: 100.0000 mg | ORAL_TABLET | Freq: Two times a day (BID) | ORAL | 0 refills | Status: AC

## 2019-05-23 NOTE — Progress Notes (Signed)
Established Patient Office Visit  Subjective:  Patient ID: Janice Massey, female    DOB: 1955-05-27  Age: 64 y.o. MRN: [id number]  CC:  Chief Complaint  Patient presents with  . URI    c/o sore throat, fever, left earache, runny nose, and fatigue x 3 days  . Diabetes    routine follow up    HPI Janice Massey presents for follow-up on DM and 4 day history of fever, nasal congestion, sore throat. Please see problem based charting for further details.   Past Medical History:  Diagnosis Date  . Atypical chest pain    myoview 10/29/10 - Post-stress EF 56%. Low risk and negative for ischemia   . Back pain   . Back pain   . 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 Chronic PID   . Chronic PID (chronic pelvic inflammatory disease)   . Deaf    Since age 33  . Diabetes mellitus   . Dizziness   . Dyslipidemia   . Dyspnea   . History of Doppler ultrasound 06/20/09   Normal, no prior studies for comparison.   . Hypertension   . Lightheadedness   . Mute   . Obese   . URI (upper respiratory infection)   . Ventricular septal defect 2004 per echo   small membranous VSD    Past Surgical History:  Procedure Laterality Date  . CARDIAC CATHETERIZATION  08/23/95  . CHOLECYSTECTOMY  11/04/1993  . COLPOSCOPY    . TUBAL LIGATION  10/05/1978    Family History  Problem Relation Age of Onset  . Cancer Mother        liver  . Heart attack Mother 59  . Hypertension Father   . Diabetes Father   . Heart attack Father   . CAD Father 17  . Diabetes Paternal Aunt   . Diabetes Maternal Grandfather     Social History   Socioeconomic History  . Marital status: Single    Spouse name: Not on file  . Number of children: 1  . Years of education: Not on file  . Highest education level: Not on file  Occupational History    Employer: UNEMPLOYED  Tobacco Use  . Smoking status: Never Smoker  . Smokeless tobacco: Never Used  Substance and Sexual  Activity  . Alcohol use: No    Alcohol/week: 0.0 standard drinks  . Drug use: No  . Sexual activity: Never    Birth control/protection: Surgical  Other Topics Concern  . Not on file  Social History Narrative  . Not on file   Social Determinants of Health   Financial Resource Strain:   . Difficulty of Paying Living Expenses:   Food Insecurity:   . Worried About Charity fundraiser in the Last Year:   . Arboriculturist in the Last Year:   Transportation Needs:   . Film/video editor (Medical):   Marland Kitchen Lack of Transportation (Non-Medical):   Physical Activity:   . Days of Exercise per Week:   . Minutes of Exercise per Session:   Stress:   . Feeling of Stress :   Social Connections:   . Frequency of Communication with Friends and Family:   . Frequency of Social Gatherings with Friends and Family:   . Attends Religious Services:   . Active Member of Clubs or Organizations:   . Attends Archivist Meetings:   Marland Kitchen Marital  Status:   Intimate Partner Violence:   . Fear of Current or Ex-Partner:   . Emotionally Abused:   Marland Kitchen Physically Abused:   . Sexually Abused:     Outpatient Medications Prior to Visit  Medication Sig Dispense Refill  . aspirin 81 MG chewable tablet Chew 1 tablet (81 mg total) by mouth daily. 30 tablet 3  . atorvastatin (LIPITOR) 40 MG tablet TAKE 1 TABLET(40 MG) BY MOUTH DAILY 90 tablet 1  . diltiazem (CARDIZEM CD) 120 MG 24 hr capsule TAKE 1 CAPSULE(120 MG) BY MOUTH DAILY 90 capsule 1  . losartan (COZAAR) 100 MG tablet Take 1 tablet (100 mg total) by mouth daily. 90 tablet 2  . metFORMIN (GLUCOPHAGE) 1000 MG tablet TAKE 1 TABLET(1000 MG) BY MOUTH TWICE DAILY 180 tablet 1  . canagliflozin (INVOKANA) 300 MG TABS tablet Take 1 tablet (300 mg total) by mouth daily before breakfast. 30 tablet 2  . cetirizine (ZYRTEC) 10 MG tablet TAKE 1 TABLET(10 MG) BY MOUTH DAILY 30 tablet 3  . hydrochlorothiazide (HYDRODIURIL) 25 MG tablet TAKE 1 TABLET(25 MG) BY MOUTH  DAILY 30 tablet 5  . omeprazole (PRILOSEC) 40 MG capsule TAKE 1 CAPSULE(40 MG) BY MOUTH DAILY 30 capsule 5   No facility-administered medications prior to visit.    Allergies  Allergen Reactions  . Codeine Itching  . Lisinopril Cough  . Penicillins Itching    Has patient had a PCN reaction causing immediate rash, facial/tongue/throat swelling, SOB or lightheadedness with hypotension: YES Has patient had a PCN reaction causing severe rash involving mucus membranes or skin necrosis: NO Has patient had a PCN reaction that required hospitalization NO Has patient had a PCN reaction occurring within the last 10 years: NO If all of the above answers are "NO", then may proceed with Cephalosporin use.    ROS Review of Systems  Constitutional: Positive for fatigue and fever.  HENT: Positive for congestion, ear pain, postnasal drip, rhinorrhea, sinus pressure, sinus pain and sore throat. Negative for trouble swallowing.   Respiratory: Negative for shortness of breath and wheezing.   Cardiovascular: Negative for chest pain.  Gastrointestinal: Negative for abdominal pain, diarrhea and vomiting.  Genitourinary: Negative for dysuria.  Musculoskeletal: Negative for arthralgias, myalgias and neck pain.  Skin: Negative for rash.  Neurological: Negative for dizziness, syncope, weakness and numbness.  Psychiatric/Behavioral: Negative for dysphoric mood and sleep disturbance.      Objective:    Physical Exam  Constitutional: She is oriented to person, place, and time. She appears well-developed and well-nourished. No distress.  HENT:  Right Ear: Tympanic membrane normal.  Left Ear: Tympanic membrane normal.  Nose: Mucosal edema and rhinorrhea present. Right sinus exhibits maxillary sinus tenderness and frontal sinus tenderness. Left sinus exhibits maxillary sinus tenderness and frontal sinus tenderness.  Mouth/Throat: Oropharynx is clear and moist and mucous membranes are normal. No oropharyngeal  exudate.  Bilateral ear canals tender and erythematous   Eyes: Right conjunctiva is injected. Left conjunctiva is injected.  Cardiovascular: Regular rhythm. Tachycardia present.  Pulmonary/Chest: Effort normal and breath sounds normal.  Abdominal: Soft. She exhibits no distension. There is no abdominal tenderness.  Musculoskeletal:        General: No edema. Normal range of motion.  Neurological: She is alert and oriented to person, place, and time.  Skin: Skin is warm and dry. She is not diaphoretic.  Psychiatric: She has a normal mood and affect. Her behavior is normal.    BP 114/81 (BP Location: Left Arm, Patient Position:  Sitting, Cuff Size: Normal)   Pulse (!) 111   Temp 100.1 F (37.8 C) (Oral)   Wt 188 lb 9.6 oz (85.5 kg)   SpO2 97%   BMI 29.54 kg/m  Wt Readings from Last 3 Encounters:  05/23/19 188 lb 9.6 oz (85.5 kg)  02/19/19 197 lb 3.2 oz (89.4 kg)  01/31/19 198 lb (89.8 kg)     Health Maintenance Due  Topic Date Due  . OPHTHALMOLOGY EXAM  08/25/2017  . PAP SMEAR-Modifier  07/16/2018    There are no preventive care reminders to display for this patient.  Lab Results  Component Value Date   TSH 1.593 03/31/2010   Lab Results  Component Value Date   WBC 10.1 03/05/2018   HGB 13.4 03/05/2018   HCT 41.9 03/05/2018   MCV 88.8 03/05/2018   PLT 291 03/05/2018   Lab Results  Component Value Date   NA 142 03/09/2019   K 3.5 03/09/2019   CO2 25 03/09/2019   GLUCOSE 176 (H) 03/09/2019   BUN 15 03/09/2019   CREATININE 0.98 03/09/2019   BILITOT 1.0 03/05/2018   ALKPHOS 72 03/05/2018   AST 27 03/05/2018   ALT 30 03/05/2018   PROT 7.8 03/05/2018   ALBUMIN 4.2 03/05/2018   CALCIUM 9.8 03/09/2019   ANIONGAP 11 03/05/2018   Lab Results  Component Value Date   CHOL 140 10/10/2013   Lab Results  Component Value Date   HDL 55 10/10/2013   Lab Results  Component Value Date   LDLCALC 51 10/10/2013   Lab Results  Component Value Date   TRIG 172 (H)  10/10/2013   Lab Results  Component Value Date   CHOLHDL 2.5 10/10/2013   Lab Results  Component Value Date   HGBA1C 7.9 (A) 05/23/2019      Assessment & Plan:   Problem List Items Addressed This Visit      Cardiovascular and Mediastinum   Essential hypertension (Chronic)    Ms. Burghardt remains well controlled. Continue current therapy.       Relevant Medications   hydrochlorothiazide (HYDRODIURIL) 25 MG tablet     Respiratory   Acute rhinosinusitis    Patient presents with 4 days of sore throat, nasal congestion, sinus pressure. Endorses intermittent fevers. No shortness of breath. Occasional productive cough. She says this is similar to previous sinus infections and would like an antibiotic.   She has a penicillin allergy so will do 5 days of Doxy.  Educated on importance of nasal lavage and intranasal corticosteroid use.       Relevant Medications   cetirizine (ZYRTEC) 10 MG tablet   doxycycline (VIBRA-TABS) 100 MG tablet   fluticasone (FLONASE) 50 MCG/ACT nasal spray     Endocrine   Diabetes (HCC) - Primary (Chronic)    A1C improved from 8.6>7.9 after increasing Invokana at last visit. She has also been doing better with lifestyle modifications and has lost 10 pounds over the last 3 months.  She has always been insistent on sticking to an oral medication regimen. Unfortunately, GLP-1 is not on preferred medicaid list. Will add DPP4 inhibitor to regimen to hopefully achieve goal of <7.        Relevant Medications   sitaGLIPtin (JANUVIA) 100 MG tablet   canagliflozin (INVOKANA) 300 MG TABS tablet   Other Relevant Orders   POC Hbg A1C (Completed)      Meds ordered this encounter  Medications  . sitaGLIPtin (JANUVIA) 100 MG tablet    Sig: Take 1  tablet (100 mg total) by mouth daily.    Dispense:  30 tablet    Refill:  2  . canagliflozin (INVOKANA) 300 MG TABS tablet    Sig: Take 1 tablet (300 mg total) by mouth daily before breakfast.    Dispense:  30 tablet     Refill:  2  . hydrochlorothiazide (HYDRODIURIL) 25 MG tablet    Sig: TAKE 1 TABLET(25 MG) BY MOUTH DAILY    Dispense:  30 tablet    Refill:  5  . omeprazole (PRILOSEC) 40 MG capsule    Sig: TAKE 1 CAPSULE(40 MG) BY MOUTH DAILY    Dispense:  30 capsule    Refill:  5  . cetirizine (ZYRTEC) 10 MG tablet    Sig: TAKE 1 TABLET(10 MG) BY MOUTH DAILY    Dispense:  30 tablet    Refill:  3  . doxycycline (VIBRA-TABS) 100 MG tablet    Sig: Take 1 tablet (100 mg total) by mouth 2 (two) times daily for 5 days.    Dispense:  10 tablet    Refill:  0  . fluticasone (FLONASE) 50 MCG/ACT nasal spray    Sig: Place 2 sprays into both nostrils daily.    Dispense:  16 g    Refill:  2    Follow-up: Return in about 3 months (around 08/23/2019) for DM.    Delice Bison, DO

## 2019-05-23 NOTE — Patient Instructions (Addendum)
Janice Massey, I'm so sorry to hear you're not feeling well. I am prescribing you a 5 day course of antibiotic. I'd also like you to use a netti pot for sinus irrigation, as well as flonase spray twice daily.   Your diabetes: I'm adding a medication called Januvia to take once daily in addition to your other medications. This should get you to our goal of <7 by next visit. Keep up the great work!   Feel better soon, and I'll see you in 3 months!  How to Perform a Sinus Rinse A sinus rinse is a home treatment. It rinses your sinuses with a mixture of salt and water (saline solution). Sinuses are air-filled spaces in your skull behind the bones of your face and forehead. They open into your nasal cavity. A sinus rinse can help to clear your nasal cavity. It can clear mucus, dirt, dust, or pollen. You may do a sinus rinse when you have:  A cold.  A virus.  Allergies.  A sinus infection.  A stuffy nose. Talk with your doctor about whether a sinus rinse might help you. What are the risks? A sinus rinse is normally very safe and helpful. However, there are a few risks. These include:  A burning feeling in the sinuses. This may happen if you do not make the saline solution as instructed. Be sure to follow all directions when making the saline solution.  Nasal irritation.  Infection from unclean water. This is rare, but possible. Do not do a sinus rinse if you have had:  Ear or nasal surgery.  An ear infection.  Blocked ears. Supplies needed:  Saline solution or powder.  Distilled or germ-free (sterile) water may be needed to mix with saline powder. ? You may use boiled and cooled tap water. Boil tap water for 5 minutes; cool until it is lukewarm. Use within 24 hours. ? Do not use regular tap water to mix with the saline solution.  Neti pot or nasal rinse bottle. This releases the saline solution into your nose and through your sinuses. You can buy neti pots and rinse bottles: ? At  your local pharmacy. ? At a health food store. ? Online. How to perform a sinus rinse  1. Wash your hands with soap and water. 2. Wash your device using the directions that came with it. 3. Dry your device. 4. Use the solution that comes with your device or one that is sold separately in stores. Follow the mixing directions on the package if you need to mix with sterile or distilled water. 5. Fill your device with the amount of saline solution stated in the device instructions. 6. Stand over a sink and tilt your head sideways over the sink. 7. Place the spout of the device in your upper nostril (the one closer to the ceiling). 8. Gently pour or squeeze the saline solution into your nasal cavity. The liquid should drain to your lower nostril if you are not too stuffed up (congested). 9. While rinsing, breathe through your open mouth. 10. Gently blow your nose to clear any mucus and rinse solution. Blowing too hard may cause ear pain. 11. Repeat in your other nostril. 12. Clean and rinse your device with clean water. 13. Air-dry your device. Talk with your doctor or pharmacist if you have questions about how to do a sinus rinse. Summary  A sinus rinse is a home treatment. It rinses your sinuses with a mixture of salt and water (saline solution).  A sinus rinse is normally very safe and helpful. Follow all instructions carefully.  Talk with your doctor about whether a sinus rinse might help you. This information is not intended to replace advice given to you by your health care provider. Make sure you discuss any questions you have with your health care provider. Document Revised: 10/18/2016 Document Reviewed: 10/18/2016 Elsevier Patient Education  2020 [employer].

## 2019-05-24 ENCOUNTER — Encounter: Payer: Self-pay | Admitting: Internal Medicine

## 2019-05-24 DIAGNOSIS — J019 Acute sinusitis, unspecified: Secondary | ICD-10-CM | POA: Insufficient documentation

## 2019-05-24 NOTE — Assessment & Plan Note (Signed)
Patient presents with 4 days of sore throat, nasal congestion, sinus pressure. Endorses intermittent fevers. No shortness of breath. Occasional productive cough. She says this is similar to previous sinus infections and would like an antibiotic.   She has a penicillin allergy so will do 5 days of Doxy.  Educated on importance of nasal lavage and intranasal corticosteroid use.

## 2019-05-24 NOTE — Assessment & Plan Note (Signed)
Janice Massey remains well controlled. Continue current therapy.

## 2019-05-24 NOTE — Progress Notes (Signed)
Internal Medicine Clinic Attending  Case discussed with Dr. Bloomfield at the time of the visit.  We reviewed the resident's history and exam and pertinent patient test results.  I agree with the assessment, diagnosis, and plan of care documented in the resident's note.  

## 2019-05-24 NOTE — Assessment & Plan Note (Signed)
A1C improved from 8.6>7.9 after increasing Invokana at last visit. She has also been doing better with lifestyle modifications and has lost 10 pounds over the last 3 months.  She has always been insistent on sticking to an oral medication regimen. Unfortunately, GLP-1 is not on preferred medicaid list. Will add DPP4 inhibitor to regimen to hopefully achieve goal of <7.

## 2019-05-25 ENCOUNTER — Ambulatory Visit: Payer: Medicaid Other | Admitting: Cardiovascular Disease

## 2019-06-05 ENCOUNTER — Other Ambulatory Visit: Payer: Self-pay

## 2019-06-05 ENCOUNTER — Telehealth (INDEPENDENT_AMBULATORY_CARE_PROVIDER_SITE_OTHER): Payer: Self-pay | Admitting: Family Medicine

## 2019-06-05 DIAGNOSIS — J069 Acute upper respiratory infection, unspecified: Secondary | ICD-10-CM

## 2019-06-05 DIAGNOSIS — K5900 Constipation, unspecified: Secondary | ICD-10-CM

## 2019-06-05 DIAGNOSIS — R079 Chest pain, unspecified: Secondary | ICD-10-CM

## 2019-06-05 NOTE — Progress Notes (Signed)
[facility] Telemedicine Visit  Patient consented to have virtual visit and was identified by name and date of birth. Method of visit: Video was attempted, but technology challenges prevented patient from using video, so visit was conducted via telephone.  Encounter participants: Patient: Brandi Mendez - located at home Provider: Levert Feinstein - located at office Others (if applicable): n/a  Chief Complaint: cold symptoms   HPI:  Cold symptoms- 1 week ago began having cold symptoms. Her grandchildren were sick around the same time. She is fully vaccinated against COVID. Had congestion and sore throat, which are both better now. She is now just coughing now and then. Has not been COVID tested.  Stomach problems - sometimes has regular bowel movement, other times stool is hard and can't have bowel movement. Has not tried any medications. History of SBO, this does not feel like that. Passing gas, eating and drinking normally.  Chest pain - has had for 2 weeks, off and on, comes and goes. Feels like sharp pain. Lasts just a few minutes. Getting once per week. Nothing brings it on. Worse if she lifts something heavy. No shortness of breath, nausea, or sweating. No history of cardiac disease. Does not smoke. On L side of her chest. Not reproducible with patient palpating. Able to walk around and do everyday activities without getting chest pain. Has not had any since last week.  ROS: per HPI  Pertinent PMHx:  History of obesity, hypertriglyceridemia  Exam:  Respiratory: Patient speaking normally in full sentences throughout the encounter, without any respiratory distress evident. Occasional cough.  Assessment/Plan:  Cold symptoms  - suspect routine viral URI which has been going around our community. Overall improving. Also could be COVID breakthrough case (though suspect much less likely to be this). Recommend she be tested for COVID to be sure. As [facility] testing site not open today, assisted patient with scheduling an appointment this AM at [facility] [address] for driveup PCR COVID testing. Once negative, can be seen in office for issues below.  Chest pain - atypical sounding, no pain since last week. Given her age, I do think doing an EKG is reasonable. Visit scheduled later this week with Dr. Clent Ridges for EKG. Discussed going to ED if chest pain recurs.  Constipation - symptoms sound consistent with constipation. Recommend trial of miralax, patient states she has this at home and will try it.  Time spent during visit with patient: 29 minutes  Levert Feinstein, MD [facility]

## 2019-06-06 ENCOUNTER — Ambulatory Visit: Payer: No Typology Code available for payment source

## 2019-06-07 NOTE — Progress Notes (Signed)
    SUBJECTIVE:   CHIEF COMPLAINT / HPI:   Chest pain Patient reports 1-2 weeks of intermittent chest pain.She states pain is sharp and occurs with exertion.  She denies any onset of pain when walking or climbing stairs.  Does report having SOB when walking to the mall and back but this is not new and has not gotten worse.  She reports that she notices the pain more after playing with her grand babies.  She endorses pain after lifting her grandson. Pain starts after about 5-10 mins of playing and resolves when puts child down.  Denies any chest pressure,heaviness, radiation of pain, nausea/vomiting,diaphorsesis.  She does reports palpitations periodically that resolves on it own. She is not currently having pain now and last time had pain was about 2-3d ago.  Cough Non productive cough for about  2-3 weeks. Initially was coughing a lot but has now decreased.  Feels like thick mucus in throat. Recent COVID test 06/21 negative.  Denies any fevers.  Non smoker.  Lt shoulder pain Reports intermittent posterior shoulder pain.  Was plying with grand kids and lifting grandson who is a little heavy. Pain resolves when putting child down. Tylenol helps.    PERTINENT  PMH / PSH:  HLD Obesity  FmHx: twin sister with heart murmur OBJECTIVE:   BP 140/70   Pulse 75   Ht 5' 4.61" (1.641 m)   Wt 183 lb 3.2 oz (83.1 kg)   SpO2 100%   BMI 30.86 kg/m   General: Alert and oriented, no apparent distress  Cardiovascular: RRR with no murmurs noted Respiratory: CTA bilaterally  Lt Shoulder: ROM intact,slight tenderness over posterior aspect of shoulder  ASSESSMENT/PLAN:   Atypical chest pain Likely secondary to muscle strain.  No significant risk factors for CAD but given advancing age would like have outpatient Cardiology assess -CBC today -EKG show no STEMI, NSR -refer to outpatient Cardiology for further evaluation -Strict return precautions given if worsening symptoms.     Cough Likely  secondary to viral URI.  COVID negative, no fevers and cough resolving. -increase fluids -symptom management -if symptoms worse follow up with PCP  Left shoulder pain Likely secondary to heavy lifting and mild muscle strain. -Avoid lifting heavy objects -Tylenol prn for discomfort -Warm/cold compresses prn -Follow up with PCP     Dana Allan, MD [facility]

## 2019-06-07 NOTE — Patient Instructions (Addendum)
It was a pleasure meeting you today.  You were seen for follow up for recent episode of chest pain.  Your EKG was normal  I have ordered blood work today I have also sent a referral to Cardiology to further evaluate your heart.  If you have any worsening symptoms lasting longer than 5-10 mins Call 911 to go to the Emergency department.  You can take come Tylenol for discomfort.  Brandi Leitz, MD [facility]         Nonspecific Chest Pain Chest pain can be caused by many different conditions. Some causes of chest pain can be life-threatening. These will require treatment right away. Serious causes of chest pain include:  Heart attack.  A tear in the body's main blood vessel.  Redness and swelling (inflammation) around your heart.  Blood clot in your lungs. Other causes of chest pain may not be so serious. These include:  Heartburn.  Anxiety or stress.  Damage to bones or muscles in your chest.  Lung infections. Chest pain can feel like:  Pain or discomfort in your chest.  Crushing, pressure, aching, or squeezing pain.  Burning or tingling.  Dull or sharp pain that is worse when you move, cough, or take a deep breath.  Pain or discomfort that is also felt in your back, neck, jaw, shoulder, or arm, or pain that spreads to any of these areas. It is hard to know whether your pain is caused by something that is serious or something that is not so serious. So it is important to see your doctor right away if you have chest pain. Follow these instructions at home: Medicines  Take over-the-counter and prescription medicines only as told by your doctor.  If you were prescribed an antibiotic medicine, take it as told by your doctor. Do not stop taking the antibiotic even if you start to feel better. Lifestyle   Rest as told by your doctor.  Do not use any products that contain nicotine or tobacco, such as cigarettes, e-cigarettes, and chewing tobacco.  If you need help quitting, ask your doctor.  Do not drink alcohol.  Make lifestyle changes as told by your doctor. These may include: ? Getting regular exercise. Ask your doctor what activities are safe for you. ? Eating a heart-healthy diet. A diet and nutrition specialist (dietitian) can help you to learn healthy eating options. ? Staying at a healthy weight. ? Treating diabetes or high blood pressure, if needed. ? Lowering your stress. Activities such as yoga and relaxation techniques can help. General instructions  Pay attention to any changes in your symptoms. Tell your doctor about them or any new symptoms.  Avoid any activities that cause chest pain.  Keep all follow-up visits as told by your doctor. This is important. You may need more testing if your chest pain does not go away. Contact a doctor if:  Your chest pain does not go away.  You feel depressed.  You have a fever. Get help right away if:  Your chest pain is worse.  You have a cough that gets worse, or you cough up blood.  You have very bad (severe) pain in your belly (abdomen).  You pass out (faint).  You have either of these for no clear reason: ? Sudden chest discomfort. ? Sudden discomfort in your arms, back, neck, or jaw.  You have shortness of breath at any time.  You suddenly start to sweat, or your skin gets clammy.  You feel sick  to your stomach (nauseous).  You throw up (vomit).  You suddenly feel lightheaded or dizzy.  You feel very weak or tired.  Your heart starts to beat fast, or it feels like it is skipping beats. These symptoms may be an emergency. Do not wait to see if the symptoms will go away. Get medical help right away. Call your local emergency services (911 in the [place] Do not drive yourself to the hospital. Summary  Chest pain can be caused by many different conditions. The cause may be serious and need treatment right away. If you have chest pain, see your doctor right  away.  Follow your doctor's instructions for taking medicines and making lifestyle changes.  Keep all follow-up visits as told by your doctor. This includes visits for any further testing if your chest pain does not go away.  Be sure to know the signs that show that your condition has become worse. Get help right away if you have these symptoms. This information is not intended to replace advice given to you by your health care provider. Make sure you discuss any questions you have with your health care provider. Document Revised: 06/23/2017 Document Reviewed: 06/23/2017 Elsevier Patient Education  2020 [employer]   Chest Wall Pain Chest wall pain is pain in or around the bones and muscles of your chest. Chest wall pain may be caused by:  An injury.  Coughing a lot.  Using your chest and arm muscles too much. Sometimes, the cause may not be known. This pain may take a few weeks or longer to get better. Follow these instructions at home: Managing pain, stiffness, and swelling If told, put ice on the painful area:  Put ice in a plastic bag.  Place a towel between your skin and the bag.  Leave the ice on for 20 minutes, 2-3 times a day.  Activity  Rest as told by your doctor.  Avoid doing things that cause pain. This includes lifting heavy items.  Ask your doctor what activities are safe for you. General instructions   Take over-the-counter and prescription medicines only as told by your doctor.  Do not use any products that contain nicotine or tobacco, such as cigarettes, e-cigarettes, and chewing tobacco. If you need help quitting, ask your doctor.  Keep all follow-up visits as told by your doctor. This is important. Contact a doctor if:  You have a fever.  Your chest pain gets worse.  You have new symptoms. Get help right away if:  You feel sick to your stomach (nauseous) or you throw up (vomit).  You feel sweaty or light-headed.  You have a cough with  mucus from your lungs (sputum) or you cough up blood.  You are short of breath. These symptoms may be an emergency. Do not wait to see if the symptoms will go away. Get medical help right away. Call your local emergency services (911 in the [place] Do not drive yourself to the hospital. Summary  Chest wall pain is pain in or around the bones and muscles of your chest.  It may be treated with ice, rest, and medicines. Your condition may also get better if you avoid doing things that cause pain.  Contact a doctor if you have a fever, chest pain that gets worse, or new symptoms.  Get help right away if you feel light-headed or you get short of breath. These symptoms may be an emergency. This information is not intended to replace advice given to you  by your health care provider. Make sure you discuss any questions you have with your health care provider. Document Revised: 06/23/2017 Document Reviewed: 06/23/2017 Elsevier Patient Education  2020 [employer].

## 2019-06-08 ENCOUNTER — Other Ambulatory Visit: Payer: Self-pay

## 2019-06-08 ENCOUNTER — Ambulatory Visit (INDEPENDENT_AMBULATORY_CARE_PROVIDER_SITE_OTHER): Payer: Self-pay | Admitting: Family Medicine

## 2019-06-08 ENCOUNTER — Ambulatory Visit (HOSPITAL_COMMUNITY)
Admission: RE | Admit: 2019-06-08 | Discharge: 2019-06-08 | Disposition: A | Discharge status: Home / Self Care | Payer: No Typology Code available for payment source | Source: Ambulatory Visit | Attending: Internal Medicine | Admitting: Internal Medicine

## 2019-06-08 VITALS — BP 140/70 | HR 75 | Ht 64.61 in | Wt 183.2 lb

## 2019-06-08 DIAGNOSIS — R079 Chest pain, unspecified: Secondary | ICD-10-CM

## 2019-06-08 DIAGNOSIS — R059 Cough, unspecified: Secondary | ICD-10-CM

## 2019-06-08 DIAGNOSIS — R05 Cough: Secondary | ICD-10-CM

## 2019-06-08 DIAGNOSIS — M25512 Pain in left shoulder: Secondary | ICD-10-CM

## 2019-06-09 LAB — CBC WITH DIFFERENTIAL/PLATELET
Basophils Absolute: 0.1 10*3/uL (ref 0.0–0.2)
Basos: 1 %
EOS (ABSOLUTE): 0.1 10*3/uL (ref 0.0–0.4)
Eos: 2 %
Hematocrit: 37.7 % (ref 34.0–46.6)
Hemoglobin: 12.5 g/dL (ref 11.1–15.9)
Immature Grans (Abs): 0 10*3/uL (ref 0.0–0.1)
Immature Granulocytes: 0 %
Lymphocytes Absolute: 2.2 10*3/uL (ref 0.7–3.1)
Lymphs: 32 %
MCH: 30.7 pg (ref 26.6–33.0)
MCHC: 33.2 g/dL (ref 31.5–35.7)
MCV: 93 fL (ref 79–97)
Monocytes Absolute: 0.5 10*3/uL (ref 0.1–0.9)
Monocytes: 7 %
Neutrophils Absolute: 4.1 10*3/uL (ref 1.4–7.0)
Neutrophils: 58 %
Platelets: 320 10*3/uL (ref 150–450)
RBC: 4.07 x10E6/uL (ref 3.77–5.28)
RDW: 12.6 % (ref 11.7–15.4)
WBC: 7 10*3/uL (ref 3.4–10.8)

## 2019-06-10 ENCOUNTER — Encounter: Payer: Self-pay | Admitting: Family Medicine

## 2019-06-10 DIAGNOSIS — M25512 Pain in left shoulder: Secondary | ICD-10-CM | POA: Insufficient documentation

## 2019-06-10 DIAGNOSIS — R059 Cough, unspecified: Secondary | ICD-10-CM | POA: Insufficient documentation

## 2019-06-10 NOTE — Assessment & Plan Note (Signed)
Likely secondary to heavy lifting and mild muscle strain. -Avoid lifting heavy objects -Tylenol prn for discomfort -Warm/cold compresses prn -Follow up with PCP

## 2019-06-10 NOTE — Assessment & Plan Note (Addendum)
Likely secondary to muscle strain.  No significant risk factors for CAD but given advancing age would like have outpatient Cardiology assess -CBC today -EKG show no STEMI, NSR -refer to outpatient Cardiology for further evaluation -Strict return precautions given if worsening symptoms.

## 2019-06-10 NOTE — Assessment & Plan Note (Signed)
Likely secondary to viral URI.  COVID negative, no fevers and cough resolving. -increase fluids -symptom management -if symptoms worse follow up with PCP

## 2019-06-18 ENCOUNTER — Other Ambulatory Visit: Payer: Self-pay | Admitting: Internal Medicine

## 2019-06-21 ENCOUNTER — Other Ambulatory Visit: Payer: Self-pay

## 2019-06-21 ENCOUNTER — Ambulatory Visit (HOSPITAL_COMMUNITY): Payer: Medicaid Other | Attending: Cardiovascular Disease

## 2019-06-21 DIAGNOSIS — Q210 Ventricular septal defect: Secondary | ICD-10-CM | POA: Insufficient documentation

## 2019-06-29 ENCOUNTER — Other Ambulatory Visit: Payer: Self-pay | Admitting: Internal Medicine

## 2019-07-09 NOTE — Progress Notes (Signed)
07/10/2019 Janice Massey   05-07-1955  [id number]  Primary Physician Delice Bison, DO Primary Cardiologist: Dr. Johnsie Cancel   Reason for Visit/CC:  VSD  HPI: 64 y.o. history of restrictive VSD. Last TTE 06/21/19 reviewed EF 60-65% normal RV small restrictive VSD  No changes from TTE done 05/24/18  Additional past medical history is also significant for diabetes, hypertension and hyperlipidemia. She is on appropriate medical therapy and reports full daily compliance.   She is a twin.  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 aspirin 81 MG chewable tablet Chew 1 tablet (81 mg total) by mouth daily. 30 tablet 3  . atorvastatin (LIPITOR) 40 MG tablet TAKE 1 TABLET(40 MG) BY MOUTH DAILY 90 tablet 1  . canagliflozin (INVOKANA) 300 MG TABS tablet Take 1 tablet (300 mg total) by mouth daily before breakfast. 30 tablet 2  . cetirizine (ZYRTEC) 10 MG tablet TAKE 1 TABLET(10 MG) BY MOUTH DAILY 30 tablet 3  . diltiazem (CARDIZEM CD) 120 MG 24 hr capsule TAKE 1 CAPSULE(120 MG) BY MOUTH DAILY 90 capsule 1  . fluticasone (FLONASE) 50 MCG/ACT nasal spray Place 2 sprays into both nostrils daily. 16 g 2  . hydrochlorothiazide (HYDRODIURIL) 25 MG tablet TAKE 1 TABLET(25 MG) BY MOUTH DAILY 30 tablet 5  . losartan (COZAAR) 100 MG tablet Take 1 tablet (100 mg total) by mouth daily. 90 tablet 2  . metFORMIN (GLUCOPHAGE) 1000 MG tablet TAKE 1 TABLET(1000 MG) BY MOUTH TWICE DAILY 180 tablet 1  . omeprazole (PRILOSEC) 40 MG capsule TAKE 1 CAPSULE(40 MG) BY MOUTH DAILY 30 capsule 5  . sitaGLIPtin (JANUVIA) 100 MG tablet Take  1 tablet (100 mg total) by mouth daily. 30 tablet 2   No current facility-administered medications for this visit.    Allergies  Allergen Reactions  . Codeine Itching  . Lisinopril Cough  . Penicillins Itching    Has patient had a PCN reaction causing immediate rash, facial/tongue/throat swelling, SOB or lightheadedness with hypotension: YES Has patient had a PCN reaction causing severe rash involving mucus membranes or skin necrosis: NO Has patient had a PCN reaction that required hospitalization NO Has patient had a PCN reaction occurring within the last 10 years: NO If all of the above answers are "NO", then may proceed with Cephalosporin use.    Social History   Socioeconomic History  . Marital status: Single    Spouse name: Not on file  . Number of children: 1  . Years of education: Not on file  . Highest education level: Not on file  Occupational History    Employer: UNEMPLOYED  Tobacco Use  . Smoking status: Never Smoker  . Smokeless tobacco: Never Used  Vaping Use  . Vaping Use: Never used  Substance and Sexual Activity  . Alcohol use: No    Alcohol/week: 0.0 standard drinks  . Drug use: No  . Sexual activity: Never    Birth control/protection: Surgical  Other Topics Concern  . Not on file  Social History Narrative  . Not on file   Social Determinants of Health  Financial Resource Strain:   . Difficulty of Paying Living Expenses:   Food Insecurity:   . Worried About Charity fundraiser in the Last Year:   . Arboriculturist in the Last Year:   Transportation Needs:   . Film/video editor (Medical):   Marland Kitchen Lack of Transportation (Non-Medical):   Physical Activity:   . Days of Exercise per Week:   . Minutes of Exercise per Session:   Stress:   . Feeling of Stress :   Social Connections:   . Frequency of Communication with Friends and Family:   . Frequency of Social Gatherings with Friends and Family:   . Attends Religious Services:   . Active  Member of Clubs or Organizations:   . Attends Archivist Meetings:   Marland Kitchen Marital Status:   Intimate Partner Violence:   . Fear of Current or Ex-Partner:   . Emotionally Abused:   Marland Kitchen Physically Abused:   . Sexually Abused:      Review of Systems: General: negative for chills, fever, night sweats or weight changes.  Cardiovascular: negative for chest pain, dyspnea on exertion, edema, orthopnea, palpitations, paroxysmal nocturnal dyspnea or shortness of breath Dermatological: negative for rash Respiratory: negative for cough or wheezing Urologic: negative for hematuria Abdominal: negative for nausea, vomiting, diarrhea, bright red blood per rectum, melena, or hematemesis Neurologic: negative for visual changes, syncope, or dizziness All other systems reviewed and are otherwise negative except as noted above.    Blood pressure 120/72, pulse 88, height 5\' 7"  (1.702 m), weight 192 lb (87.1 kg), SpO2 98 %.   Affect appropriate Overweight black female  HEENT: Deaf  Neck supple with no adenopathy JVP normal no bruits no thyromegaly Lungs clear with no wheezing and good diaphragmatic motion Heart:  S1/S2 VSD murmur, no rub, gallop or click PMI normal Abdomen: benighn, BS positve, no tenderness, no AAA no bruit.  No HSM or HJR post cholecystectomy  Distal pulses intact with no bruits No edema Neuro non-focal Skin warm and dry    EKG NSR rate 88 ? 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 Chest Pain:  Resolved normal myovue 2015 and 2016  Given DM will order lexiscan myovue   2. HTN: followed by PCP. Controlled on HCTZ and Cozaar   3. HLD: on statin.  4. DM: followed by [facility] Invokana increased October 2019 A1c 7.7 discussed low carb diet   5. VSD:  Restrictive normal LV/RV TTE 06/21/19    6. Aorta:  CT done 03/15/19 with root 3.1 cm no PDA no coronary calcium   7. Preoperative:  Ok to have shoulder surgery with Dr Basilio Cairo at anytime will need SBE  prophylaxis for this    Jenkins Rouge

## 2019-07-10 ENCOUNTER — Other Ambulatory Visit: Payer: Self-pay

## 2019-07-10 ENCOUNTER — Ambulatory Visit (INDEPENDENT_AMBULATORY_CARE_PROVIDER_SITE_OTHER): Payer: Medicaid Other | Admitting: Cardiovascular Disease

## 2019-07-10 ENCOUNTER — Encounter: Payer: Self-pay | Admitting: Cardiovascular Disease

## 2019-07-10 VITALS — BP 120/72 | HR 88 | Ht 67.0 in | Wt 192.0 lb

## 2019-07-10 DIAGNOSIS — Q210 Ventricular septal defect: Secondary | ICD-10-CM | POA: Diagnosis not present

## 2019-07-10 NOTE — Patient Instructions (Addendum)
Medication Instructions:  *If you need a refill on your cardiac medications before your next appointment, please call your pharmacy*  Lab Work: If you have labs (blood work) drawn today and your tests are completely normal, you will receive your results only by: . MyChart Message (if you have MyChart) OR . A paper copy in the mail If you have any lab test that is abnormal or we need to change your treatment, we will call you to review the results.  Testing/Procedures: Your physician has requested that you have an echocardiogram in 1 year. Echocardiography is a painless test that uses sound waves to create images of your heart. It provides your doctor with information about the size and shape of your heart and how well your heart's chambers and valves are working. This procedure takes approximately one hour. There are no restrictions for this procedure.   Follow-Up: At [facility], you and your health needs are our priority.  As part of our continuing mission to provide you with exceptional heart care, we have created designated Provider Care Teams.  These Care Teams include your primary Cardiologist (physician) and Advanced Practice Providers (APPs -  Physician Assistants and Nurse Practitioners) who all work together to provide you with the care you need, when you need it.  We recommend signing up for the patient portal called "MyChart".  Sign up information is provided on this After Visit Summary.  MyChart is used to connect with patients for Virtual Visits (Telemedicine).  Patients are able to view lab/test results, encounter notes, upcoming appointments, etc.  Non-urgent messages can be sent to your provider as well.   To learn more about what you can do with MyChart, go to [email or web address].    Your next appointment:   12 month(s)  The format for your next appointment:   In Person  Provider:   You may see Peter Nishan, MD or one of the following Advanced Practice Providers on  your designated Care Team:    Lori Gerhardt, NP  Laura Ingold, NP  Jill McDaniel, NP     

## 2019-07-30 ENCOUNTER — Encounter: Payer: Self-pay | Admitting: Internal Medicine

## 2019-07-30 ENCOUNTER — Other Ambulatory Visit: Payer: Self-pay

## 2019-07-30 ENCOUNTER — Ambulatory Visit: Payer: Medicaid Other | Admitting: Internal Medicine

## 2019-07-30 VITALS — BP 143/88 | HR 88 | Temp 98.3°F | Ht 67.0 in | Wt 194.4 lb

## 2019-07-30 DIAGNOSIS — E1129 Type 2 diabetes mellitus with other diabetic kidney complication: Secondary | ICD-10-CM

## 2019-07-30 DIAGNOSIS — Z7984 Long term (current) use of oral hypoglycemic drugs: Secondary | ICD-10-CM

## 2019-07-30 DIAGNOSIS — N6323 Unspecified lump in the left breast, lower outer quadrant: Secondary | ICD-10-CM

## 2019-07-30 DIAGNOSIS — Z1231 Encounter for screening mammogram for malignant neoplasm of breast: Secondary | ICD-10-CM

## 2019-07-30 LAB — POCT GLYCOSYLATED HEMOGLOBIN (HGB A1C): Hemoglobin A1C: 7.6 % — AB (ref 4.0–5.6)

## 2019-07-30 LAB — GLUCOSE, CAPILLARY: Glucose-Capillary: 181 mg/dL — ABNORMAL HIGH (ref 70–99)

## 2019-07-30 MED ORDER — SITAGLIPTIN PHOSPHATE 100 MG PO TABS: 100.0000 mg | ORAL_TABLET | Freq: Every day | ORAL | 2 refills | Status: DC

## 2019-07-30 MED ORDER — CYCLOBENZAPRINE HCL 10 MG PO TABS
10.0000 mg | ORAL_TABLET | Freq: Three times a day (TID) | ORAL | 0 refills | Status: DC | PRN
Start: 2019-07-30 — End: 2020-04-03

## 2019-07-30 MED ORDER — CANAGLIFLOZIN 300 MG PO TABS: 300.0000 mg | ORAL_TABLET | Freq: Every day | ORAL | 2 refills | Status: DC

## 2019-07-30 NOTE — Patient Instructions (Addendum)
Janice Massey, It was a pleasure seeing you.   For your diabetes, please continue taking the medications as prescribed. I called that pharmacy, and it might be an issue of bringing in your updated card or needing to get in touch with your insurance provider.   For your breast lump, I have ordered a mammogram and ultrasound.   I have also prescribed a medication to help with your neck and back spasms.   Take care, and I'll see you in 3 months! Dr. Koleen Distance

## 2019-07-31 ENCOUNTER — Encounter: Payer: Self-pay | Admitting: Internal Medicine

## 2019-07-31 DIAGNOSIS — N6323 Unspecified lump in the left breast, lower outer quadrant: Secondary | ICD-10-CM

## 2019-07-31 HISTORY — DX: Unspecified lump in the left breast, lower outer quadrant: N63.23

## 2019-07-31 NOTE — Assessment & Plan Note (Signed)
Patient's A1C was checked approximately a month early in order to get on PCP scheduling. It does show improvement from 7.9>7.6 since addition of Januvia. She is tolerating both Invokana and Januvia well.  Will continue current regimen and return for re-check in 3 months.

## 2019-07-31 NOTE — Progress Notes (Signed)
Internal Medicine Clinic Attending  Case discussed with Dr. Bloomfield  At the time of the visit.  We reviewed the resident's history and exam and pertinent patient test results.  I agree with the assessment, diagnosis, and plan of care documented in the resident's note.  

## 2019-07-31 NOTE — Assessment & Plan Note (Signed)
Patient noticed lump under breast approximately 2 weeks ago with some overlying bruising. She has not fallen or injured the area. Will order diagnostic mammogram and ultrasound for further evaluation. She had a normal screening mammogram last year.

## 2019-07-31 NOTE — Progress Notes (Signed)
Established Patient Office Visit  Subjective:  Patient ID: Janice Massey, female    DOB: 22-Dec-1955  Age: 64 y.o. MRN: [id number]  CC:  Chief Complaint  Patient presents with  . Medication Refill  . Follow-up    HPI Janice Massey presents for follow-up on type II DM, acute concern of breast lump.   Past Medical History:  Diagnosis Date  . Atypical chest pain    myoview 10/29/10 - Post-stress EF 56%. Low risk and negative for ischemia   . Back pain   . Back pain   . 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 Chronic PID   . Chronic PID (chronic pelvic inflammatory disease)   . Deaf    Since age 31  . Diabetes mellitus   . Dizziness   . Dyslipidemia   . Dyspnea   . History of Doppler ultrasound 06/20/09   Normal, no prior studies for comparison.   . Hypertension   . Lightheadedness   . Mute   . Obese   . URI (upper respiratory infection)   . Ventricular septal defect 2004 per echo   small membranous VSD    Past Surgical History:  Procedure Laterality Date  . CARDIAC CATHETERIZATION  08/23/95  . CHOLECYSTECTOMY  11/04/1993  . COLPOSCOPY    . TUBAL LIGATION  10/05/1978    Family History  Problem Relation Age of Onset  . Cancer Mother        liver  . Heart attack Mother 28  . Hypertension Father   . Diabetes Father   . Heart attack Father   . CAD Father 39  . Diabetes Paternal Aunt   . Diabetes Maternal Grandfather     Social History   Socioeconomic History  . Marital status: Single    Spouse name: Not on file  . Number of children: 1  . Years of education: Not on file  . Highest education level: Not on file  Occupational History    Employer: UNEMPLOYED  Tobacco Use  . Smoking status: Never Smoker  . Smokeless tobacco: Never Used  Vaping Use  . Vaping Use: Never used  Substance and Sexual Activity  . Alcohol use: No    Alcohol/week: 0.0 standard drinks  . Drug use: No  . Sexual activity: Never     Birth control/protection: Surgical  Other Topics Concern  . Not on file  Social History Narrative  . Not on file   Social Determinants of Health   Financial Resource Strain:   . Difficulty of Paying Living Expenses:   Food Insecurity:   . Worried About Charity fundraiser in the Last Year:   . Arboriculturist in the Last Year:   Transportation Needs:   . Film/video editor (Medical):   Marland Kitchen Lack of Transportation (Non-Medical):   Physical Activity:   . Days of Exercise per Week:   . Minutes of Exercise per Session:   Stress:   . Feeling of Stress :   Social Connections:   . Frequency of Communication with Friends and Family:   . Frequency of Social Gatherings with Friends and Family:   . Attends Religious Services:   . Active Member of Clubs or Organizations:   . Attends Archivist Meetings:   Marland Kitchen Marital Status:   Intimate Partner Violence:   . Fear of Current or Ex-Partner:   . Emotionally Abused:   Marland Kitchen Physically  Abused:   . Sexually Abused:     Outpatient Medications Prior to Visit  Medication Sig Dispense Refill  . aspirin 81 MG chewable tablet Chew 1 tablet (81 mg total) by mouth daily. 30 tablet 3  . atorvastatin (LIPITOR) 40 MG tablet TAKE 1 TABLET(40 MG) BY MOUTH DAILY 90 tablet 1  . cetirizine (ZYRTEC) 10 MG tablet TAKE 1 TABLET(10 MG) BY MOUTH DAILY 30 tablet 3  . diltiazem (CARDIZEM CD) 120 MG 24 hr capsule TAKE 1 CAPSULE(120 MG) BY MOUTH DAILY 90 capsule 1  . fluticasone (FLONASE) 50 MCG/ACT nasal spray Place 2 sprays into both nostrils daily. 16 g 2  . hydrochlorothiazide (HYDRODIURIL) 25 MG tablet TAKE 1 TABLET(25 MG) BY MOUTH DAILY 30 tablet 5  . losartan (COZAAR) 100 MG tablet Take 1 tablet (100 mg total) by mouth daily. 90 tablet 2  . metFORMIN (GLUCOPHAGE) 1000 MG tablet TAKE 1 TABLET(1000 MG) BY MOUTH TWICE DAILY 180 tablet 1  . omeprazole (PRILOSEC) 40 MG capsule TAKE 1 CAPSULE(40 MG) BY MOUTH DAILY 30 capsule 5  . canagliflozin (INVOKANA) 300  MG TABS tablet Take 1 tablet (300 mg total) by mouth daily before breakfast. 30 tablet 2  . sitaGLIPtin (JANUVIA) 100 MG tablet Take 1 tablet (100 mg total) by mouth daily. 30 tablet 2   No facility-administered medications prior to visit.    Allergies  Allergen Reactions  . Codeine Itching  . Lisinopril Cough  . Penicillins Itching    Has patient had a PCN reaction causing immediate rash, facial/tongue/throat swelling, SOB or lightheadedness with hypotension: YES Has patient had a PCN reaction causing severe rash involving mucus membranes or skin necrosis: NO Has patient had a PCN reaction that required hospitalization NO Has patient had a PCN reaction occurring within the last 10 years: NO If all of the above answers are "NO", then may proceed with Cephalosporin use.    ROS Review of Systems  Constitutional: Negative for chills, fever and unexpected weight change.  HENT: Negative for sinus pressure, sore throat and trouble swallowing.   Respiratory: Negative for cough and shortness of breath.   Cardiovascular: Negative for chest pain, palpitations and leg swelling.  Gastrointestinal: Negative for nausea.  Endocrine: Negative for polydipsia, polyphagia and polyuria.  Genitourinary: Negative for difficulty urinating and hematuria.  Musculoskeletal: Positive for myalgias and neck pain.  Neurological: Negative for dizziness, weakness, numbness and headaches.  Psychiatric/Behavioral: Negative for dysphoric mood and sleep disturbance.      Objective:    Physical Exam Constitutional:      General: She is not in acute distress.    Appearance: Normal appearance.  Eyes:     Conjunctiva/sclera: Conjunctivae normal.  Neck:     Comments: Paraspinal muscle tenderness  Cardiovascular:     Rate and Rhythm: Normal rate and regular rhythm.  Pulmonary:     Effort: Pulmonary effort is normal.     Breath sounds: Normal breath sounds.  Abdominal:     General: There is no distension.      Palpations: Abdomen is soft.     Tenderness: There is no abdominal tenderness.  Musculoskeletal:     Left shoulder: Tenderness present. Decreased range of motion.  Skin:    Comments: Left breast with 2 cm palpable lump at 5 o'clock position that is tender and mobile. Also has some overlying ecchymosis   Neurological:     Mental Status: She is alert.     BP (!) 143/88 (BP Location: Left Arm, Cuff Size: Normal)  Pulse 88   Temp 98.3 F (36.8 C) (Oral)   Ht 5\' 7"  (1.702 m)   Wt 194 lb 6.4 oz (88.2 kg)   SpO2 98% Comment: room air  BMI 30.45 kg/m  Wt Readings from Last 3 Encounters:  07/30/19 194 lb 6.4 oz (88.2 kg)  07/10/19 192 lb (87.1 kg)  05/23/19 188 lb 9.6 oz (85.5 kg)     Health Maintenance Due  Topic Date Due  . OPHTHALMOLOGY EXAM  08/25/2017  . PAP SMEAR-Modifier  07/16/2018    There are no preventive care reminders to display for this patient.  Lab Results  Component Value Date   TSH 1.593 03/31/2010   Lab Results  Component Value Date   WBC 10.1 03/05/2018   HGB 13.4 03/05/2018   HCT 41.9 03/05/2018   MCV 88.8 03/05/2018   PLT 291 03/05/2018   Lab Results  Component Value Date   NA 142 03/09/2019   K 3.5 03/09/2019   CO2 25 03/09/2019   GLUCOSE 176 (H) 03/09/2019   BUN 15 03/09/2019   CREATININE 0.98 03/09/2019   BILITOT 1.0 03/05/2018   ALKPHOS 72 03/05/2018   AST 27 03/05/2018   ALT 30 03/05/2018   PROT 7.8 03/05/2018   ALBUMIN 4.2 03/05/2018   CALCIUM 9.8 03/09/2019   ANIONGAP 11 03/05/2018   Lab Results  Component Value Date   CHOL 140 10/10/2013   Lab Results  Component Value Date   HDL 55 10/10/2013   Lab Results  Component Value Date   LDLCALC 51 10/10/2013   Lab Results  Component Value Date   TRIG 172 (H) 10/10/2013   Lab Results  Component Value Date   CHOLHDL 2.5 10/10/2013   Lab Results  Component Value Date   HGBA1C 7.6 (A) 07/30/2019      Assessment & Plan:   Problem List Items Addressed This Visit        Endocrine   Diabetes ([place]) - Primary (Chronic)    Patient's A1C was checked approximately a month early in order to get on PCP scheduling. It does show improvement from 7.9>7.6 since addition of Januvia. She is tolerating both Invokana and Januvia well.  Will continue current regimen and return for re-check in 3 months.       Relevant Medications   canagliflozin (INVOKANA) 300 MG TABS tablet   sitaGLIPtin (JANUVIA) 100 MG tablet   Other Relevant Orders   POC Hbg A1C (Completed)     Other   Breast lump on left side at 5 o'clock position    Patient noticed lump under breast approximately 2 weeks ago with some overlying bruising. She has not fallen or injured the area. Will order diagnostic mammogram and ultrasound for further evaluation. She had a normal screening mammogram last year.       Relevant Orders   US BREAST ASPIRATION LEFT   MM Digital Diagnostic Unilat L    Other Visit Diagnoses    Breast cancer screening by mammogram          Meds ordered this encounter  Medications  . canagliflozin (INVOKANA) 300 MG TABS tablet    Sig: Take 1 tablet (300 mg total) by mouth daily before breakfast.    Dispense:  30 tablet    Refill:  2  . sitaGLIPtin (JANUVIA) 100 MG tablet    Sig: Take 1 tablet (100 mg total) by mouth daily.    Dispense:  30 tablet    Refill:  2  . cyclobenzaprine (FLEXERIL)  10 MG tablet    Sig: Take 1 tablet (10 mg total) by mouth 3 (three) times daily as needed for muscle spasms.    Dispense:  30 tablet    Refill:  0    Follow-up: Return in about 3 months (around 10/30/2019) for DM.    Delice Bison, DO

## 2019-08-01 ENCOUNTER — Telehealth: Payer: Self-pay

## 2019-08-01 ENCOUNTER — Other Ambulatory Visit: Payer: Self-pay | Admitting: Family Medicine

## 2019-08-01 DIAGNOSIS — K089 Disorder of teeth and supporting structures, unspecified: Secondary | ICD-10-CM

## 2019-08-01 NOTE — Telephone Encounter (Signed)
Please inform patient that referral to dentist has been placed for her.

## 2019-08-01 NOTE — Telephone Encounter (Signed)
Patient calls nurse line regarding receiving referral to [facility] office. Patient has orange card and states that she was told she needs a referral to be able to schedule appointment with dentist.   To PCP  Veronda Prude, RN

## 2019-08-02 ENCOUNTER — Ambulatory Visit: Payer: Self-pay | Admitting: Cardiology

## 2019-08-02 NOTE — Telephone Encounter (Signed)
Informed patient.  .Brandi Mendez R Denasia Venn, CMA  

## 2019-08-03 ENCOUNTER — Encounter: Payer: Self-pay | Admitting: Dietician

## 2019-08-06 DIAGNOSIS — S46012D Strain of muscle(s) and tendon(s) of the rotator cuff of left shoulder, subsequent encounter: Secondary | ICD-10-CM | POA: Diagnosis not present

## 2019-08-07 ENCOUNTER — Encounter: Payer: Self-pay | Admitting: Cardiovascular Disease

## 2019-08-07 ENCOUNTER — Other Ambulatory Visit: Payer: Self-pay

## 2019-08-07 ENCOUNTER — Ambulatory Visit (INDEPENDENT_AMBULATORY_CARE_PROVIDER_SITE_OTHER): Payer: Self-pay | Admitting: Cardiovascular Disease

## 2019-08-07 ENCOUNTER — Encounter: Payer: Self-pay | Admitting: Student

## 2019-08-07 DIAGNOSIS — R072 Precordial pain: Secondary | ICD-10-CM

## 2019-08-07 NOTE — Progress Notes (Signed)
Reviewed faxed records from [facility] for a visit on 03/08/2018, when she presented for floaters and flashes in the R eye. Exam demonstrated R eye posterior vitreous detachment, accounting for her symptoms. No evidence of retinal pathology. Also R eye hemorrhage not requiring treatment.   Full note to be scanned into our records.

## 2019-08-07 NOTE — Progress Notes (Signed)
Cardiology Office Note:    Date:  08/07/2019   ID:  Brandi Mendez, DOB 1955-07-27, MRN [id number]  PCP:  Joana Reamer, DO  [facility] Cardiologist:  Kristeen Miss, MD  [facility] Electrophysiologist:  None   Referring MD: Latrelle Dodrill, MD   Chief Complaint  Patient presents with  . Chest Pain     History of Present Illness:    Brandi Mendez is a 64 y.o. female with a hx of hyperlipidemia and obesity.  She recently started having some chest pain and shoulder pain.  We are asked to see her today by Dr. Pollie Meyer for further evaluation of this chest pain.  Has sharp pain,  In pectoralis muscle bilaterally . Difficult historian Last for a few minutes. No radiation. No diaphoresis.   No dyspnea  Has been present off and on for several months   Has back pain   [place] [address]   No CP with walkiing .   BP has been low.  She does not eat breakfast.     Past Medical History:  Diagnosis Date  . Small bowel obstruction (HCC) 2013, 2015    Past Surgical History:  Procedure Laterality Date  . ABDOMINAL HERNIA REPAIR    . ABDOMINAL HYSTERECTOMY     [facility]  . CHOLECYSTECTOMY    . Left ankle surgery      Current Medications: Current Meds  Medication Sig  . acetaminophen (TYLENOL) 500 MG tablet Take 500 mg by mouth every 6 (six) hours as needed.  Marland Kitchen ibuprofen (ADVIL) 600 MG tablet Take 1 tablet (600 mg total) by mouth every 6 (six) hours as needed.  . [DISCONTINUED] fluticasone (FLONASE) 50 MCG/ACT nasal spray Place 2 sprays into both nostrils daily.     Allergies:   Patient has no known allergies.   Social History   Socioeconomic History  . Marital status: Single    Spouse name: Not on file  . Number of children: Not on file  . Years of education: Not on file  . Highest education level: Not on file  Occupational History  . Occupation: Retired  Tobacco Use  . Smoking status: Never Smoker  . Smokeless tobacco: Never  Used  Vaping Use  . Vaping Use: Never used  Substance and Sexual Activity  . Alcohol use: Yes    Comment: rarely  . Drug use: No  . Sexual activity: Not on file  Other Topics Concern  . Not on file  Social History Narrative  . Not on file   Social Determinants of Health   Financial Resource Strain:   . Difficulty of Paying Living Expenses:   Food Insecurity:   . Worried About Programme researcher, broadcasting/film/video in the Last Year:   . Barista in the Last Year:   Transportation Needs:   . Freight forwarder (Medical):   Marland Kitchen Lack of Transportation (Non-Medical):   Physical Activity:   . Days of Exercise per Week:   . Minutes of Exercise per Session:   Stress:   . Feeling of Stress :   Social Connections:   . Frequency of Communication with Friends and Family:   . Frequency of Social Gatherings with Friends and Family:   . Attends Religious Services:   . Active Member of Clubs or Organizations:   . Attends Banker Meetings:   Marland Kitchen Marital Status:      Family History: The patient's family history includes  Cancer in her mother; Diabetes in her father, paternal grandfather, and sister; Early death (age of onset: 47) in her brother; Heart disease in her maternal grandmother.  ROS:   Please see the history of present illness.     All other systems reviewed and are negative.  EKGs/Labs/Other Studies Reviewed:    The following studies were reviewed today:   Recent Labs: 09/20/2018: ALT 13; BUN 19; Creatinine, Ser 0.66; Potassium 4.3; Sodium 142 06/08/2019: Hemoglobin 12.5; Platelets 320  Recent Lipid Panel    Component Value Date/Time   CHOL 202 (H) 09/20/2018 0906   TRIG 123 09/20/2018 0906   HDL 45 09/20/2018 0906   CHOLHDL 4.5 (H) 09/20/2018 0906   CHOLHDL 4.3 06/30/2012 0904   VLDL 20 06/30/2012 0904   LDLCALC 135 (H) 09/20/2018 0906   LDLDIRECT 111 (H) 05/05/2011 1036    Physical Exam:    VS:  BP 98/84   Pulse 63   Ht 5\' 6"  (1.676 m)   Wt 180 lb 9.6 oz  (81.9 kg)   SpO2 96%   BMI 29.15 kg/m     Wt Readings from Last 3 Encounters:  08/07/19 180 lb 9.6 oz (81.9 kg)  06/08/19 183 lb 3.2 oz (83.1 kg)  01/24/19 177 lb (80.3 kg)     GEN:  Middle age female,  Appears older than stated age.  HEENT: Normal NECK: No JVD; No carotid bruits LYMPHATICS: No lymphadenopathy CARDIAC: RRR, no murmurs, rubs, gallops RESPIRATORY:  Clear to auscultation without rales, wheezing or rhonchi  ABDOMEN: Soft, non-tender, non-distended MUSCULOSKELETAL:  No edema; No deformity  SKIN: Warm and dry NEUROLOGIC:  Alert and oriented x 3 PSYCHIATRIC:  Normal affect   EKG:   Aug 3., 2021   NSR at 62  Diagnoses:    1. Precordial pain    Assessment and Plan:     1.  Chest pain :   Not reproducible with palpitation .   She is a very difficult historian so her hx is not all that revealing.     Will get a Lexiscan myoview to evaluate her for ischemia.  I would also like to do an echocardiogram.  She eats a very poor diet.   These pains may be due to GERD.   She would benefit from a better dietary choices and may benefit from an antiacid.    2.  Hypotension: Her blood pressure has been low at previous doctor exams and is low today.  She is being examined today at 4:30 PM.  So far she has not had any water to drink.  She skipped breakfast and has only had lunch.  I suggested to her that I think she is volume depleted.  I think she needs to drink at least 64 ounces of water a day and to make sure that she eats 3 meals a day.  I have suggested that she work on improving her diet.  She ate [employer] with biscuits, mashed potatoes and gravy.  I suggest that she improve her diet.  It is possible that the sort of diet might be causing some indigestion.  We will see her on an as needed. Basis   Medication Adjustments/Labs and Tests Ordered: Current medicines are reviewed at length with the patient today.  Concerns regarding medicines are outlined above.  Orders Placed This  Encounter  Procedures  . MYOCARDIAL PERFUSION IMAGING  . EKG 12-Lead  . ECHOCARDIOGRAM COMPLETE   No orders of the defined types were placed in this  encounter.   Patient Instructions  Medication Instructions:  Your physician recommends that you continue on your current medications as directed. Please refer to the Current Medication list given to you today.  *If you need a refill on your cardiac medications before your next appointment, please call your pharmacy*   Testing/Procedures: Your physician has requested that you have an echocardiogram. Echocardiography is a painless test that uses sound waves to create images of your heart. It provides your doctor with information about the size and shape of your heart and how well your heart's chambers and valves are working. This procedure takes approximately one hour. There are no restrictions for this procedure.  Your physician has requested that you have a lexiscan myoview. For further information please visit [email or web address] Please follow instruction sheet, as given.  Follow-Up: At [facility], you and your health needs are our priority.  As part of our continuing mission to provide you with exceptional heart care, we have created designated Provider Care Teams.  These Care Teams include your primary Cardiologist (physician) and Advanced Practice Providers (APPs -  Physician Assistants and Nurse Practitioners) who all work together to provide you with the care you need, when you need it.  We recommend signing up for the patient portal called "MyChart".  Sign up information is provided on this After Visit Summary.  MyChart is used to connect with patients for Virtual Visits (Telemedicine).  Patients are able to view lab/test results, encounter notes, upcoming appointments, etc.  Non-urgent messages can be sent to your provider as well.   To learn more about what you can do with MyChart, go to [email or web address].    Follow up with  Dr. Elease Hashimoto as needed based on results of testing.       Signed, Kristeen Miss, MD  08/07/2019 4:32 PM    [facility]

## 2019-08-07 NOTE — Patient Instructions (Signed)
Medication Instructions:  Your physician recommends that you continue on your current medications as directed. Please refer to the Current Medication list given to you today.  *If you need a refill on your cardiac medications before your next appointment, please call your pharmacy*   Testing/Procedures: Your physician has requested that you have an echocardiogram. Echocardiography is a painless test that uses sound waves to create images of your heart. It provides your doctor with information about the size and shape of your heart and how well your heart's chambers and valves are working. This procedure takes approximately one hour. There are no restrictions for this procedure.  Your physician has requested that you have a lexiscan myoview. For further information please visit [email or web address] Please follow instruction sheet, as given.  Follow-Up: At [facility], you and your health needs are our priority.  As part of our continuing mission to provide you with exceptional heart care, we have created designated Provider Care Teams.  These Care Teams include your primary Cardiologist (physician) and Advanced Practice Providers (APPs -  Physician Assistants and Nurse Practitioners) who all work together to provide you with the care you need, when you need it.  We recommend signing up for the patient portal called "MyChart".  Sign up information is provided on this After Visit Summary.  MyChart is used to connect with patients for Virtual Visits (Telemedicine).  Patients are able to view lab/test results, encounter notes, upcoming appointments, etc.  Non-urgent messages can be sent to your provider as well.   To learn more about what you can do with MyChart, go to [email or web address].    Follow up with Dr. Elease Hashimoto as needed based on results of testing.

## 2019-08-13 ENCOUNTER — Other Ambulatory Visit: Payer: Self-pay | Admitting: Family Medicine

## 2019-08-13 ENCOUNTER — Telehealth: Payer: Self-pay | Admitting: Family Medicine

## 2019-08-13 DIAGNOSIS — K089 Disorder of teeth and supporting structures, unspecified: Secondary | ICD-10-CM

## 2019-08-13 NOTE — Telephone Encounter (Signed)
Pt stated the dental office said they have not received a referral for her.  Pls fax or call [phone] attention Lelon Mast

## 2019-08-13 NOTE — Telephone Encounter (Signed)
Referral has been placed again.  Spoke to patient and informed her.

## 2019-08-14 NOTE — Telephone Encounter (Signed)
LM for patient letting her know that referral has been faxed.  Nalini Alcaraz,CMA

## 2019-08-17 ENCOUNTER — Ambulatory Visit
Admission: RE | Admit: 2019-08-17 | Discharge: 2019-08-17 | Disposition: A | Discharge status: Home / Self Care | Payer: Medicaid Other | Source: Ambulatory Visit | Attending: Internal Medicine | Admitting: Internal Medicine

## 2019-08-17 ENCOUNTER — Other Ambulatory Visit: Payer: Self-pay

## 2019-08-17 ENCOUNTER — Other Ambulatory Visit: Payer: Self-pay | Admitting: Internal Medicine

## 2019-08-17 DIAGNOSIS — N6323 Unspecified lump in the left breast, lower outer quadrant: Secondary | ICD-10-CM

## 2019-08-17 DIAGNOSIS — N6489 Other specified disorders of breast: Secondary | ICD-10-CM | POA: Diagnosis not present

## 2019-08-22 ENCOUNTER — Telehealth (HOSPITAL_COMMUNITY): Payer: Self-pay | Admitting: *Deleted

## 2019-08-22 NOTE — Telephone Encounter (Signed)
Left message on voicemail in reference to upcoming appointment scheduled for 08/27/19. Phone number given for a call back so details instructions can be given. Daneil Dolin

## 2019-08-24 ENCOUNTER — Telehealth (HOSPITAL_COMMUNITY): Payer: Self-pay | Admitting: *Deleted

## 2019-08-24 NOTE — Telephone Encounter (Signed)
Patient given detailed instructions per Myocardial Perfusion Study Information Sheet for the test on 08/27/19 at 10:15. Patient notified to arrive 15 minutes early and that it is imperative to arrive on time for appointment to keep from having the test rescheduled.  If you need to cancel or reschedule your appointment, please call the office within 24 hours of your appointment. . Patient verbalized understanding.Brandi Mendez

## 2019-08-27 ENCOUNTER — Ambulatory Visit (HOSPITAL_COMMUNITY): Payer: Self-pay | Attending: Cardiology

## 2019-08-27 ENCOUNTER — Other Ambulatory Visit: Payer: Self-pay

## 2019-08-27 ENCOUNTER — Ambulatory Visit (HOSPITAL_BASED_OUTPATIENT_CLINIC_OR_DEPARTMENT_OTHER): Payer: Self-pay

## 2019-08-27 DIAGNOSIS — R072 Precordial pain: Secondary | ICD-10-CM

## 2019-08-27 LAB — MYOCARDIAL PERFUSION IMAGING
LV dias vol: 70 mL (ref 46–106)
LV sys vol: 27 mL
Peak HR: 92 {beats}/min
Rest HR: 51 {beats}/min
SDS: 1
SRS: 1
SSS: 2
TID: 1.07

## 2019-08-27 LAB — ECHOCARDIOGRAM COMPLETE
Area-P 1/2: 3.43 cm2
S' Lateral: 2.15 cm

## 2019-08-27 MED ORDER — REGADENOSON 0.4 MG/5ML IV SOLN
0.4000 mg | Freq: Once | INTRAVENOUS | Status: AC
  Administered 2019-08-27: 0.4 mg via INTRAVENOUS

## 2019-08-27 MED ORDER — TECHNETIUM TC 99M TETROFOSMIN IV KIT
31.7000 | PACK | Freq: Once | INTRAVENOUS | Status: AC | PRN
  Administered 2019-08-27: 31.7 via INTRAVENOUS
  Filled 2019-08-27: qty 32

## 2019-08-27 MED ORDER — TECHNETIUM TC 99M TETROFOSMIN IV KIT
10.1000 | PACK | Freq: Once | INTRAVENOUS | Status: AC | PRN
  Administered 2019-08-27: 10.1 via INTRAVENOUS
  Filled 2019-08-27: qty 11

## 2019-09-05 DIAGNOSIS — S46012D Strain of muscle(s) and tendon(s) of the rotator cuff of left shoulder, subsequent encounter: Secondary | ICD-10-CM | POA: Diagnosis not present

## 2019-09-11 ENCOUNTER — Other Ambulatory Visit: Payer: Self-pay | Admitting: Internal Medicine

## 2019-09-11 DIAGNOSIS — E1129 Type 2 diabetes mellitus with other diabetic kidney complication: Secondary | ICD-10-CM

## 2019-09-27 NOTE — Progress Notes (Signed)
I left a voice message with sign language interpretering services requesting to set up interpreter for dos. Awaiting return phone call.

## 2019-09-28 ENCOUNTER — Inpatient Hospital Stay (HOSPITAL_COMMUNITY): Admission: RE | Admit: 2019-09-28 | Payer: Medicaid Other | Source: Ambulatory Visit

## 2019-09-28 DIAGNOSIS — M25551 Pain in right hip: Secondary | ICD-10-CM | POA: Diagnosis not present

## 2019-09-28 DIAGNOSIS — M545 Low back pain: Secondary | ICD-10-CM | POA: Diagnosis not present

## 2019-10-02 ENCOUNTER — Ambulatory Visit (HOSPITAL_BASED_OUTPATIENT_CLINIC_OR_DEPARTMENT_OTHER): Admission: RE | Admit: 2019-10-02 | Payer: Medicaid Other | Source: Home / Self Care | Admitting: Orthopedic Surgery

## 2019-10-02 ENCOUNTER — Encounter (HOSPITAL_BASED_OUTPATIENT_CLINIC_OR_DEPARTMENT_OTHER): Admission: RE | Payer: Self-pay | Source: Home / Self Care

## 2019-10-02 SURGERY — SHOULDER ARTHROSCOPY WITH OPEN ROTATOR CUFF REPAIR AND DISTAL CLAVICLE ACROMINECTOMY
Anesthesia: Choice | Laterality: Left

## 2019-10-05 ENCOUNTER — Other Ambulatory Visit: Payer: Self-pay | Admitting: Internal Medicine

## 2019-10-05 DIAGNOSIS — N183 Chronic kidney disease, stage 3 unspecified: Secondary | ICD-10-CM

## 2019-10-25 ENCOUNTER — Telehealth: Payer: Self-pay

## 2019-10-25 NOTE — Telephone Encounter (Signed)
Pt missed a call unsure who, pls contact [phone]

## 2019-10-29 ENCOUNTER — Ambulatory Visit: Payer: Medicaid Other | Admitting: Internal Medicine

## 2019-10-29 VITALS — BP 127/76 | HR 97 | Temp 98.4°F | Wt 192.2 lb

## 2019-10-29 DIAGNOSIS — I129 Hypertensive chronic kidney disease with stage 1 through stage 4 chronic kidney disease, or unspecified chronic kidney disease: Secondary | ICD-10-CM | POA: Diagnosis not present

## 2019-10-29 DIAGNOSIS — R809 Proteinuria, unspecified: Secondary | ICD-10-CM | POA: Diagnosis not present

## 2019-10-29 DIAGNOSIS — R319 Hematuria, unspecified: Secondary | ICD-10-CM

## 2019-10-29 DIAGNOSIS — I10 Essential (primary) hypertension: Secondary | ICD-10-CM

## 2019-10-29 DIAGNOSIS — N183 Chronic kidney disease, stage 3 unspecified: Secondary | ICD-10-CM

## 2019-10-29 DIAGNOSIS — E1122 Type 2 diabetes mellitus with diabetic chronic kidney disease: Secondary | ICD-10-CM

## 2019-10-29 DIAGNOSIS — E1129 Type 2 diabetes mellitus with other diabetic kidney complication: Secondary | ICD-10-CM

## 2019-10-29 DIAGNOSIS — N950 Postmenopausal bleeding: Secondary | ICD-10-CM

## 2019-10-29 DIAGNOSIS — Z23 Encounter for immunization: Secondary | ICD-10-CM | POA: Diagnosis not present

## 2019-10-29 LAB — POCT GLYCOSYLATED HEMOGLOBIN (HGB A1C): Hemoglobin A1C: 8.4 % — AB (ref 4.0–5.6)

## 2019-10-29 LAB — GLUCOSE, CAPILLARY: Glucose-Capillary: 178 mg/dL — ABNORMAL HIGH (ref 70–99)

## 2019-10-29 MED ORDER — HYDROCHLOROTHIAZIDE 25 MG PO TABS
ORAL_TABLET | ORAL | 5 refills | Status: DC
Start: 2019-10-29 — End: 2020-01-28

## 2019-10-29 MED ORDER — ATORVASTATIN CALCIUM 40 MG PO TABS
ORAL_TABLET | ORAL | 1 refills | Status: DC
Start: 2019-10-29 — End: 2020-05-12

## 2019-10-29 MED ORDER — OMEPRAZOLE 40 MG PO CPDR
DELAYED_RELEASE_CAPSULE | ORAL | 5 refills | Status: DC
Start: 2019-10-29 — End: 2020-05-01

## 2019-10-29 MED ORDER — DILTIAZEM HCL ER COATED BEADS 120 MG PO CP24
ORAL_CAPSULE | ORAL | 1 refills | Status: DC
Start: 2019-10-29 — End: 2019-12-10

## 2019-10-29 MED ORDER — LOSARTAN POTASSIUM 100 MG PO TABS: 100.0000 mg | ORAL_TABLET | Freq: Every day | ORAL | 2 refills | Status: DC

## 2019-10-29 MED ORDER — METFORMIN HCL 1000 MG PO TABS: ORAL_TABLET | ORAL | 1 refills | Status: DC

## 2019-10-29 MED ORDER — SITAGLIPTIN PHOSPHATE 100 MG PO TABS: ORAL_TABLET | ORAL | 2 refills | Status: DC

## 2019-10-29 MED ORDER — CANAGLIFLOZIN 300 MG PO TABS: ORAL_TABLET | ORAL | 2 refills | Status: DC

## 2019-10-29 MED ORDER — ASPIRIN 81 MG PO CHEW
81.0000 mg | CHEWABLE_TABLET | Freq: Every day | ORAL | 3 refills | Status: DC
Start: 2019-10-29 — End: 2020-07-17

## 2019-10-29 NOTE — Patient Instructions (Signed)
Ms. Amstutz,  It was good seeing you!   Today we discussed:   Pelvic pain: I am checking your bladder for an infection. If your urine is normal, I will refer you to GYN for further evaluation.   Diabetes: your A1C is up to 8.4 today. I won't make any medication changes today. I'd like you to focus on physical activity and diet modifications as best you can, and we will re-check in 3 months.   I will try to have someone call to set up an appointment at [facility] for you for the booster shot.   Take care, Dr. Koleen Distance

## 2019-10-30 LAB — URINALYSIS, ROUTINE W REFLEX MICROSCOPIC
Bilirubin, UA: NEGATIVE
Ketones, UA: NEGATIVE
Leukocytes,UA: NEGATIVE
Nitrite, UA: NEGATIVE
Protein,UA: NEGATIVE
RBC, UA: NEGATIVE
Specific Gravity, UA: >=1.03 — AB (ref 1.005–1.030)
Urobilinogen, Ur: 0.2 mg/dL (ref 0.2–1.0)
pH, UA: 5.5 (ref 5.0–7.5)

## 2019-10-30 NOTE — Progress Notes (Signed)
Established Patient Office Visit  Subjective:  Patient ID: Janice Massey, female    DOB: 02-May-1955  Age: 64 y.o. MRN: [id number]  CC: DM follow-up   HPI Janice Massey presents for follow-up on type II DM and HTN and acute complaint of lower abdominal pain and episode of hematuria. Please see problem based charting for further details.   Past Medical History:  Diagnosis Date  . Atypical chest pain    myoview 10/29/10 - Post-stress EF 56%. Low risk and negative for ischemia   . Back pain   . Back pain   . 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 Chronic PID   . Chronic PID (chronic pelvic inflammatory disease)   . Deaf    Since age 83  . Diabetes mellitus   . Dizziness   . Dyslipidemia   . Dyspnea   . History of Doppler ultrasound 06/20/09   Normal, no prior studies for comparison.   . Hypertension   . Lightheadedness   . Mute   . Obese   . URI (upper respiratory infection)   . Ventricular septal defect 2004 per echo   small membranous VSD    Past Surgical History:  Procedure Laterality Date  . CARDIAC CATHETERIZATION  08/23/95  . CHOLECYSTECTOMY  11/04/1993  . COLPOSCOPY    . TUBAL LIGATION  10/05/1978    Family History  Problem Relation Age of Onset  . Cancer Mother        liver  . Heart attack Mother 63  . Hypertension Father   . Diabetes Father   . Heart attack Father   . CAD Father 24  . Diabetes Paternal Aunt   . Diabetes Maternal Grandfather     Social History   Socioeconomic History  . Marital status: Single    Spouse name: Not on file  . Number of children: 1  . Years of education: Not on file  . Highest education level: Not on file  Occupational History    Employer: UNEMPLOYED  Tobacco Use  . Smoking status: Never Smoker  . Smokeless tobacco: Never Used  Vaping Use  . Vaping Use: Never used  Substance and Sexual Activity  . Alcohol use: No    Alcohol/week: 0.0 standard drinks  . Drug use:  No  . Sexual activity: Never    Birth control/protection: Surgical  Other Topics Concern  . Not on file  Social History Narrative  . Not on file   Social Determinants of Health   Financial Resource Strain:   . Difficulty of Paying Living Expenses: Not on file  Food Insecurity:   . Worried About Charity fundraiser in the Last Year: Not on file  . Ran Out of Food in the Last Year: Not on file  Transportation Needs:   . Lack of Transportation (Medical): Not on file  . Lack of Transportation (Non-Medical): Not on file  Physical Activity:   . Days of Exercise per Week: Not on file  . Minutes of Exercise per Session: Not on file  Stress:   . Feeling of Stress : Not on file  Social Connections:   . Frequency of Communication with Friends and Family: Not on file  . Frequency of Social Gatherings with Friends and Family: Not on file  . Attends Religious Services: Not on file  . Active Member of Clubs or Organizations: Not on file  . Attends Archivist Meetings:  Not on file  . Marital Status: Not on file  Intimate Partner Violence:   . Fear of Current or Ex-Partner: Not on file  . Emotionally Abused: Not on file  . Physically Abused: Not on file  . Sexually Abused: Not on file    Outpatient Medications Prior to Visit  Medication Sig Dispense Refill  . cetirizine (ZYRTEC) 10 MG tablet TAKE 1 TABLET(10 MG) BY MOUTH DAILY 30 tablet 3  . cyclobenzaprine (FLEXERIL) 10 MG tablet Take 1 tablet (10 mg total) by mouth 3 (three) times daily as needed for muscle spasms. 30 tablet 0  . fluticasone (FLONASE) 50 MCG/ACT nasal spray Place 2 sprays into both nostrils daily. 16 g 2  . aspirin 81 MG chewable tablet Chew 1 tablet (81 mg total) by mouth daily. 30 tablet 3  . atorvastatin (LIPITOR) 40 MG tablet TAKE 1 TABLET(40 MG) BY MOUTH DAILY 90 tablet 1  . diltiazem (CARDIZEM CD) 120 MG 24 hr capsule TAKE 1 CAPSULE(120 MG) BY MOUTH DAILY 90 capsule 1  . hydrochlorothiazide (HYDRODIURIL)  25 MG tablet TAKE 1 TABLET(25 MG) BY MOUTH DAILY 30 tablet 5  . INVOKANA 300 MG TABS tablet TAKE 1 TABLET(300 MG) BY MOUTH DAILY BEFORE BREAKFAST 30 tablet 2  . JANUVIA 100 MG tablet TAKE 1 TABLET(100 MG) BY MOUTH DAILY 30 tablet 2  . losartan (COZAAR) 100 MG tablet Take 1 tablet (100 mg total) by mouth daily. 90 tablet 2  . metFORMIN (GLUCOPHAGE) 1000 MG tablet TAKE 1 TABLET(1000 MG) BY MOUTH TWICE DAILY 180 tablet 1  . omeprazole (PRILOSEC) 40 MG capsule TAKE 1 CAPSULE(40 MG) BY MOUTH DAILY 30 capsule 5   No facility-administered medications prior to visit.    Allergies  Allergen Reactions  . Codeine Itching  . Lisinopril Cough  . Penicillins Itching    Has patient had a PCN reaction causing immediate rash, facial/tongue/throat swelling, SOB or lightheadedness with hypotension: YES Has patient had a PCN reaction causing severe rash involving mucus membranes or skin necrosis: NO Has patient had a PCN reaction that required hospitalization NO Has patient had a PCN reaction occurring within the last 10 years: NO If all of the above answers are "NO", then may proceed with Cephalosporin use.    ROS Review of Systems  Constitutional: Negative for activity change, chills and fever.  HENT: Negative for sinus pain, sore throat and trouble swallowing.   Eyes: Negative for visual disturbance.  Respiratory: Negative for cough and shortness of breath.   Cardiovascular: Negative for chest pain, palpitations and leg swelling.  Gastrointestinal: Positive for abdominal pain.  Genitourinary: Positive for hematuria.  Neurological: Negative for syncope, facial asymmetry, light-headedness and headaches.  Psychiatric/Behavioral: Negative for sleep disturbance.      Objective:    Physical Exam Constitutional:      General: She is not in acute distress.    Appearance: Normal appearance.  Eyes:     Conjunctiva/sclera: Conjunctivae normal.  Cardiovascular:     Rate and Rhythm: Normal rate and  regular rhythm.     Pulses: Normal pulses.  Pulmonary:     Effort: Pulmonary effort is normal.     Breath sounds: Normal breath sounds.  Abdominal:     General: There is no distension.     Palpations: Abdomen is soft.     Tenderness: There is abdominal tenderness in the right lower quadrant and suprapubic area. There is no guarding or rebound. Negative signs include Rovsing's sign.  Neurological:     Mental  Status: She is alert.     BP 127/76 (BP Location: Left Arm, Patient Position: Sitting, Cuff Size: Normal)   Pulse 97   Temp 98.4 F (36.9 C) (Oral)   Wt 192 lb 3.2 oz (87.2 kg)   SpO2 97%   BMI 30.10 kg/m  Wt Readings from Last 3 Encounters:  10/29/19 192 lb 3.2 oz (87.2 kg)  07/30/19 194 lb 6.4 oz (88.2 kg)  07/10/19 192 lb (87.1 kg)     Health Maintenance Due  Topic Date Due  . PAP SMEAR-Modifier  07/16/2018  . OPHTHALMOLOGY EXAM  03/08/2019  . MAMMOGRAM  09/21/2019    There are no preventive care reminders to display for this patient.  Lab Results  Component Value Date   TSH 1.593 03/31/2010   Lab Results  Component Value Date   WBC 10.1 03/05/2018   HGB 13.4 03/05/2018   HCT 41.9 03/05/2018   MCV 88.8 03/05/2018   PLT 291 03/05/2018   Lab Results  Component Value Date   NA 142 03/09/2019   K 3.5 03/09/2019   CO2 25 03/09/2019   GLUCOSE 176 (H) 03/09/2019   BUN 15 03/09/2019   CREATININE 0.98 03/09/2019   BILITOT 1.0 03/05/2018   ALKPHOS 72 03/05/2018   AST 27 03/05/2018   ALT 30 03/05/2018   PROT 7.8 03/05/2018   ALBUMIN 4.2 03/05/2018   CALCIUM 9.8 03/09/2019   ANIONGAP 11 03/05/2018   Lab Results  Component Value Date   CHOL 140 10/10/2013   Lab Results  Component Value Date   HDL 55 10/10/2013   Lab Results  Component Value Date   LDLCALC 51 10/10/2013   Lab Results  Component Value Date   TRIG 172 (H) 10/10/2013   Lab Results  Component Value Date   CHOLHDL 2.5 10/10/2013   Lab Results  Component Value Date   HGBA1C  8.4 (A) 10/29/2019      Assessment & Plan:   Problem List Items Addressed This Visit      Cardiovascular and Mediastinum   Essential hypertension (Chronic)   Relevant Medications   aspirin 81 MG chewable tablet   atorvastatin (LIPITOR) 40 MG tablet   diltiazem (CARDIZEM CD) 120 MG 24 hr capsule   hydrochlorothiazide (HYDRODIURIL) 25 MG tablet   losartan (COZAAR) 100 MG tablet     Endocrine   Diabetes (HCC) - Primary (Chronic)   Relevant Medications   aspirin 81 MG chewable tablet   atorvastatin (LIPITOR) 40 MG tablet   canagliflozin (INVOKANA) 300 MG TABS tablet   sitaGLIPtin (JANUVIA) 100 MG tablet   losartan (COZAAR) 100 MG tablet   metFORMIN (GLUCOPHAGE) 1000 MG tablet   Other Relevant Orders   POC Hbg A1C (Completed)     Other   Need for immunization against influenza   Relevant Orders   Flu Vaccine QUAD 36+ mos IM (Completed)    Other Visit Diagnoses    Type 2 diabetes mellitus with stage 3 chronic kidney disease, without long-term current use of insulin (HCC)  (Chronic)  (Chronic)     Relevant Medications   aspirin 81 MG chewable tablet   atorvastatin (LIPITOR) 40 MG tablet   canagliflozin (INVOKANA) 300 MG TABS tablet   sitaGLIPtin (JANUVIA) 100 MG tablet   losartan (COZAAR) 100 MG tablet   metFORMIN (GLUCOPHAGE) 1000 MG tablet   Hematuria, unspecified type       Relevant Orders   Urinalysis, Reflex Microscopic (Completed)      Meds ordered this encounter  Medications  . aspirin 81 MG chewable tablet    Sig: Chew 1 tablet (81 mg total) by mouth daily.    Dispense:  30 tablet    Refill:  3  . atorvastatin (LIPITOR) 40 MG tablet    Sig: TAKE 1 TABLET(40 MG) BY MOUTH DAILY    Dispense:  90 tablet    Refill:  1  . diltiazem (CARDIZEM CD) 120 MG 24 hr capsule    Sig: TAKE 1 CAPSULE(120 MG) BY MOUTH DAILY    Dispense:  90 capsule    Refill:  1  . hydrochlorothiazide (HYDRODIURIL) 25 MG tablet    Sig: TAKE 1 TABLET(25 MG) BY MOUTH DAILY    Dispense:   30 tablet    Refill:  5  . canagliflozin (INVOKANA) 300 MG TABS tablet    Sig: TAKE 1 TABLET(300 MG) BY MOUTH DAILY BEFORE BREAKFAST    Dispense:  30 tablet    Refill:  2  . sitaGLIPtin (JANUVIA) 100 MG tablet    Sig: TAKE 1 TABLET(100 MG) BY MOUTH DAILY    Dispense:  30 tablet    Refill:  2  . losartan (COZAAR) 100 MG tablet    Sig: Take 1 tablet (100 mg total) by mouth daily.    Dispense:  90 tablet    Refill:  2  . metFORMIN (GLUCOPHAGE) 1000 MG tablet    Sig: Take 1 pill twice daily    Dispense:  180 tablet    Refill:  1  . omeprazole (PRILOSEC) 40 MG capsule    Sig: TAKE 1 CAPSULE(40 MG) BY MOUTH DAILY    Dispense:  30 capsule    Refill:  5    Follow-up: Return in about 3 months (around 01/29/2020).    Delice Bison, DO

## 2019-10-31 ENCOUNTER — Encounter: Payer: Self-pay | Admitting: Internal Medicine

## 2019-10-31 DIAGNOSIS — M545 Low back pain, unspecified: Secondary | ICD-10-CM | POA: Diagnosis not present

## 2019-10-31 NOTE — Assessment & Plan Note (Signed)
Patient's A1C up to 8.4 from 7.6. She has been compliant with her Metformin, Januvia and Invokana. She is adamant about not wanting to use injectable medications. At her previous visit, I had looked to see if oral GLP-1 was an option but unfortunately it is not on Medicaid preferred list. She is not at a dangerous A1C level and does not show evidence of complications. Would prefer to avoid adding an agent like a sulfonylurea if possible given increased risk of hypoglycemic events. Will continue monitoring and encouraged lifestyle and diet modifications. Would only add additional meds if A1C continues to worsen.

## 2019-10-31 NOTE — Assessment & Plan Note (Signed)
This problem is chronic and stable. Blood pressure at goal for type II DM. Continue current therapy with Losartan and HCTZ.

## 2019-10-31 NOTE — Assessment & Plan Note (Signed)
Patient presents with one episode of bleeding that occurred day prior to her appointment. She noticed after urination and didn't seem to think it was vaginal bleeding. Bleeding hasn't recurred since. U/A obtained at this visit. No evidence of pyuria, bacteruria, or hematuria.  Will refer to GYN for further evaluation to work-up postmenopausal bleeding. It appears this occurred 6 years. Endometrial bx at that time was negative.

## 2019-11-05 NOTE — Progress Notes (Signed)
Internal Medicine Clinic Attending  Case discussed with Dr. Bloomfield  At the time of the visit.  We reviewed the resident's history and exam and pertinent patient test results.  I agree with the assessment, diagnosis, and plan of care documented in the resident's note.  

## 2019-11-09 NOTE — Telephone Encounter (Signed)
Pt was seen 4 days after the call

## 2019-11-15 ENCOUNTER — Other Ambulatory Visit: Payer: Self-pay | Admitting: Internal Medicine

## 2019-11-15 DIAGNOSIS — M545 Low back pain, unspecified: Secondary | ICD-10-CM | POA: Diagnosis not present

## 2019-11-17 ENCOUNTER — Encounter (HOSPITAL_COMMUNITY): Payer: Self-pay

## 2019-11-17 ENCOUNTER — Ambulatory Visit (HOSPITAL_COMMUNITY)
Admission: EM | Admit: 2019-11-17 | Discharge: 2019-11-17 | Disposition: A | Discharge status: Home / Self Care | Payer: Medicaid Other | Attending: Emergency Medicine | Admitting: Emergency Medicine

## 2019-11-17 ENCOUNTER — Other Ambulatory Visit: Payer: Self-pay

## 2019-11-17 DIAGNOSIS — R112 Nausea with vomiting, unspecified: Secondary | ICD-10-CM | POA: Diagnosis not present

## 2019-11-17 DIAGNOSIS — B349 Viral infection, unspecified: Secondary | ICD-10-CM | POA: Insufficient documentation

## 2019-11-17 DIAGNOSIS — R059 Cough, unspecified: Secondary | ICD-10-CM

## 2019-11-17 DIAGNOSIS — Z20822 Contact with and (suspected) exposure to covid-19: Secondary | ICD-10-CM | POA: Diagnosis not present

## 2019-11-17 LAB — SARS CORONAVIRUS 2 (TAT 6-24 HRS): SARS Coronavirus 2: NEGATIVE

## 2019-11-17 MED ORDER — ONDANSETRON 4 MG PO TBDP
ORAL_TABLET | ORAL | Status: AC
  Filled 2019-11-17: qty 1

## 2019-11-17 MED ORDER — ONDANSETRON 4 MG PO TBDP
4.0000 mg | ORAL_TABLET | Freq: Once | ORAL | Status: AC
  Administered 2019-11-17: 4 mg via ORAL

## 2019-11-17 MED ORDER — ONDANSETRON HCL 4 MG PO TABS: 4.0000 mg | ORAL_TABLET | Freq: Four times a day (QID) | ORAL | 0 refills | Status: DC | PRN

## 2019-11-17 NOTE — ED Notes (Signed)
Pt states continues to have improvement in nausea.  Tolerating small, frequent sips PO fluids.

## 2019-11-17 NOTE — ED Triage Notes (Signed)
Pt present coughing with vomiting. Symptoms started on Thursday.  Pt states that she just feels terrible and very nausea

## 2019-11-17 NOTE — ED Notes (Signed)
Translated via ASL interp: Pt given instruction to wait approx 10 min following ondansetron admin to start with small, frequent sips PO fluids.  Pt verbalized understanding.

## 2019-11-17 NOTE — Discharge Instructions (Signed)
Take the antinausea medication as directed.    Keep yourself hydrated with clear liquids, such as water, Gatorade, Pedialyte, Sprite, or ginger ale.    Go to the emergency department if you have acute worsening symptoms.    Follow up with your primary care provider if your symptoms are not improving.      

## 2019-11-17 NOTE — ED Provider Notes (Signed)
[place]    CSN: [id number] Arrival date & time: 11/17/19  1126      History   Chief Complaint Chief Complaint  Patient presents with  . Emesis  . Nausea  . Cough    HPI Janice Massey is a 64 y.o. female.   Patient presents with 3-day history of nonproductive cough, nausea, vomiting, malaise.  She denies fever, chills, shortness of breath, abdominal pain, or other symptoms.  Treatment attempted at home with ginger ale.  Her medical history includes deafness, diabetes, asthma, hypertension, cardiac murmur, ventral septal defect, GERD, back pain, obesity.  The history is provided by the patient and medical records. A language interpreter was used.    Past Medical History:  Diagnosis Date  . Atypical chest pain    myoview 10/29/10 - Post-stress EF 56%. Low risk and negative for ischemia   . Back pain   . Back pain   . 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 Chronic PID   . Chronic PID (chronic pelvic inflammatory disease)   . Deaf    Since age 56  . Diabetes mellitus   . Dizziness   . Dyslipidemia   . Dyspnea   . History of Doppler ultrasound 06/20/09   Normal, no prior studies for comparison.   . Hypertension   . Lightheadedness   . Mute   . Obese   . URI (upper respiratory infection)   . Ventricular septal defect 2004 per echo   small membranous VSD    Patient Active Problem List   Diagnosis Date Noted  . Breast lump on left side at 5 o'clock position 07/31/2019  . Acute rhinosinusitis 05/24/2019  . Need for immunization against influenza 10/30/2017  . 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 Post-menopausal bleeding 10/01/2013  . VSD (ventricular septal defect), perimembranous 05/29/2013  . Encounter for screening colonoscopy 11/09/2011  . Essential hypertension 08/10/2011  . Hyperlipidemia 10/11/2007  . Hearing loss 11/10/2005  . Asthma 11/10/2005  . GERD 11/10/2005  . Diabetes  ([place]) 04/05/2002    Past Surgical History:  Procedure Laterality Date  . CARDIAC CATHETERIZATION  08/23/95  . CHOLECYSTECTOMY  11/04/1993  . COLPOSCOPY    . TUBAL LIGATION  10/05/1978    OB History    Gravida  2   Para  2   Term  2   Preterm      AB      Living  2     SAB      TAB      Ectopic      Multiple      Live Births               Home Medications    Prior to Admission medications   Medication Sig Start Date End Date Taking? Authorizing Provider  cetirizine (ZYRTEC) 10 MG tablet TAKE 1 TABLET(10 MG) BY MOUTH DAILY 11/15/19   Madalyn Rob, MD  aspirin 81 MG chewable tablet Chew 1 tablet (81 mg total) by mouth daily. 10/29/19   Bloomfield, Carley D, DO  atorvastatin (LIPITOR) 40 MG tablet TAKE 1 TABLET(40 MG) BY MOUTH DAILY 10/29/19   [place]field, Carley D, DO  canagliflozin (INVOKANA) 300 MG TABS tablet TAKE 1 TABLET(300 MG) BY MOUTH DAILY BEFORE BREAKFAST 10/29/19   [place]field, Carley D, DO  cyclobenzaprine (FLEXERIL) 10 MG tablet Take 1 tablet (10 mg total) by mouth 3 (three) times daily as needed for  muscle spasms. 07/30/19   Bloomfield, Carley D, DO  diltiazem (CARDIZEM CD) 120 MG 24 hr capsule TAKE 1 CAPSULE(120 MG) BY MOUTH DAILY 10/29/19   Bloomfield, Carley D, DO  fluticasone (FLONASE) 50 MCG/ACT nasal spray Place 2 sprays into both nostrils daily. 05/23/19 05/22/20  Bloomfield, Carley D, DO  hydrochlorothiazide (HYDRODIURIL) 25 MG tablet TAKE 1 TABLET(25 MG) BY MOUTH DAILY 10/29/19   Bloomfield, Carley D, DO  losartan (COZAAR) 100 MG tablet Take 1 tablet (100 mg total) by mouth daily. 10/29/19   [place]omfield, Nila Nephew D, DO  metFORMIN (GLUCOPHAGE) 1000 MG tablet Take 1 pill twice daily 10/29/19   Bloomfield, Carley D, DO  omeprazole (PRILOSEC) 40 MG capsule TAKE 1 CAPSULE(40 MG) BY MOUTH DAILY 10/29/19   [place], Carley D, DO  ondansetron (ZOFRAN) 4 MG tablet Take 1 tablet (4 mg total) by mouth every 6 (six) hours as needed for nausea or vomiting.  11/17/19   Sharion Balloon, NP  sitaGLIPtin (JANUVIA) 100 MG tablet TAKE 1 TABLET(100 MG) BY MOUTH DAILY 10/29/19   [place], Carley D, DO  hydrochlorothiazide (HYDRODIURIL) 25 MG tablet Take 1 tablet (25 mg total) by mouth daily. 03/19/18   Modena Nunnery D, DO    Family History Family History  Problem Relation Age of Onset  . Cancer Mother        liver  . Heart attack Mother 52  . Hypertension Father   . Diabetes Father   . Heart attack Father   . CAD Father 74  . Diabetes Paternal Aunt   . Diabetes Maternal Grandfather     Social History Social History   Tobacco Use  . Smoking status: Never Smoker  . Smokeless tobacco: Never Used  Vaping Use  . Vaping Use: Never used  Substance Use Topics  . Alcohol use: No    Alcohol/week: 0.0 standard drinks  . Drug use: No     Allergies   Codeine, Lisinopril, and Penicillins   Review of Systems Review of Systems  Constitutional: Negative for chills and fever.  HENT: Negative for ear pain and sore throat.   Eyes: Negative for pain and visual disturbance.  Respiratory: Positive for cough. Negative for shortness of breath.   Cardiovascular: Negative for chest pain and palpitations.  Gastrointestinal: Positive for nausea and vomiting. Negative for abdominal pain and diarrhea.  Genitourinary: Negative for dysuria and hematuria.  Musculoskeletal: Negative for arthralgias and back pain.  Skin: Negative for color change and rash.  Neurological: Negative for seizures and syncope.  All other systems reviewed and are negative.    Physical Exam Triage Vital Signs ED Triage Vitals  Enc Vitals Group     BP 11/17/19 1211 110/64     Pulse Rate 11/17/19 1211 (!) 112     Resp 11/17/19 1211 16     Temp 11/17/19 1211 97.8 F (36.6 C)     Temp Source 11/17/19 1211 Oral     SpO2 11/17/19 1211 99 %     Weight --      Height --      Head Circumference --      Peak Flow --      Pain Score 11/17/19 1215 8     Pain Loc --       Pain Edu? --      Excl. in [place]? --    No data found.  Updated Vital Signs BP 110/64 (BP Location: Right Arm)   Pulse (!) 112   Temp 97.8 F (36.6 C) (  Oral)   Resp 16   SpO2 99%   Visual Acuity Right Eye Distance:   Left Eye Distance:   Bilateral Distance:    Right Eye Near:   Left Eye Near:    Bilateral Near:     Physical Exam Vitals and nursing note reviewed.  Constitutional:      General: She is not in acute distress.    Appearance: She is well-developed. She is not ill-appearing.  HENT:     Head: Normocephalic and atraumatic.     Right Ear: Tympanic membrane normal.     Left Ear: Tympanic membrane normal.     Nose: Nose normal.     Mouth/Throat:     Mouth: Mucous membranes are moist.     Pharynx: Oropharynx is clear.  Eyes:     Conjunctiva/sclera: Conjunctivae normal.  Cardiovascular:     Rate and Rhythm: Normal rate and regular rhythm.     Heart sounds: Normal heart sounds.  Pulmonary:     Effort: Pulmonary effort is normal. No respiratory distress.     Breath sounds: Normal breath sounds. No wheezing or rhonchi.  Abdominal:     General: Bowel sounds are normal.     Palpations: Abdomen is soft.     Tenderness: There is abdominal tenderness. There is no right CVA tenderness, left CVA tenderness, guarding or rebound.  Musculoskeletal:     Cervical back: Neck supple.  Skin:    General: Skin is warm and dry.     Findings: No rash.  Neurological:     Mental Status: She is alert.     Gait: Gait normal.  Psychiatric:        Mood and Affect: Mood normal.        Behavior: Behavior normal.      UC Treatments / Results  Labs (all labs ordered are listed, but only abnormal results are displayed) Labs Reviewed  SARS CORONAVIRUS 2 (TAT 6-24 HRS)    EKG   Radiology No results found.  Procedures Procedures (including critical care time)  Medications Ordered in UC Medications  ondansetron (ZOFRAN-ODT) disintegrating tablet 4 mg (4 mg Oral Given  11/17/19 1300)    Initial Impression / Assessment and Plan / UC Course  I have reviewed the triage vital signs and the nursing notes.  Pertinent labs & imaging results that were available during my care of the patient were reviewed by me and considered in my medical decision making (see chart for details).   Viral illness, nausea with vomiting, cough.  PCR COVID pending.  Patient able to tolerate oral liquids without vomiting after Zofran given.  Instructed her to take Tylenol as needed for discomfort.  Instructed her to stay hydrated with clear liquids and to go to the ED if she has acute worsening symptoms.  Instructed her to follow-up with her PCP.  Patient agrees to plan of care.   Final Clinical Impressions(s) / UC Diagnoses   Final diagnoses:  Viral illness  Non-intractable vomiting with nausea, unspecified vomiting type  Cough  Encounter for laboratory testing for COVID-19 virus     Discharge Instructions     Take the antinausea medication as directed.    Keep yourself hydrated with clear liquids, such as water, Gatorade, Pedialyte, Sprite, or ginger ale.    Go to the emergency department if you have acute worsening symptoms.    Follow up with your primary care provider if your symptoms are not improving.  ED Prescriptions    Medication Sig Dispense Auth. Provider   ondansetron (ZOFRAN) 4 MG tablet Take 1 tablet (4 mg total) by mouth every 6 (six) hours as needed for nausea or vomiting. 12 tablet Sharion Balloon, NP     PDMP not reviewed this encounter.   Sharion Balloon, NP 11/17/19 1334

## 2019-11-19 ENCOUNTER — Other Ambulatory Visit: Payer: Medicaid Other

## 2019-11-23 ENCOUNTER — Other Ambulatory Visit: Payer: Medicaid Other

## 2019-11-23 DIAGNOSIS — M545 Low back pain, unspecified: Secondary | ICD-10-CM | POA: Diagnosis not present

## 2019-12-06 ENCOUNTER — Other Ambulatory Visit: Payer: Self-pay | Admitting: Internal Medicine

## 2019-12-08 ENCOUNTER — Other Ambulatory Visit: Payer: Self-pay | Admitting: Internal Medicine

## 2019-12-19 ENCOUNTER — Other Ambulatory Visit: Payer: Medicaid Other

## 2019-12-21 ENCOUNTER — Other Ambulatory Visit: Payer: Medicaid Other

## 2020-01-01 ENCOUNTER — Encounter: Payer: Medicaid Other | Admitting: Obstetrics and Gynecology

## 2020-01-28 ENCOUNTER — Ambulatory Visit: Payer: Medicaid Other | Admitting: Internal Medicine

## 2020-01-28 ENCOUNTER — Encounter: Payer: Self-pay | Admitting: Internal Medicine

## 2020-01-28 VITALS — BP 128/73 | HR 79 | Temp 98.4°F | Wt 190.3 lb

## 2020-01-28 DIAGNOSIS — Z0000 Encounter for general adult medical examination without abnormal findings: Secondary | ICD-10-CM

## 2020-01-28 DIAGNOSIS — R809 Proteinuria, unspecified: Secondary | ICD-10-CM | POA: Diagnosis not present

## 2020-01-28 DIAGNOSIS — E1129 Type 2 diabetes mellitus with other diabetic kidney complication: Secondary | ICD-10-CM

## 2020-01-28 DIAGNOSIS — I10 Essential (primary) hypertension: Secondary | ICD-10-CM | POA: Diagnosis not present

## 2020-01-28 LAB — POCT GLYCOSYLATED HEMOGLOBIN (HGB A1C): Hemoglobin A1C: 7.6 % — AB (ref 4.0–5.6)

## 2020-01-28 LAB — GLUCOSE, CAPILLARY: Glucose-Capillary: 113 mg/dL — ABNORMAL HIGH (ref 70–99)

## 2020-01-28 MED ORDER — HYDROCHLOROTHIAZIDE 25 MG PO TABS
ORAL_TABLET | ORAL | 5 refills | Status: DC
Start: 2020-01-28 — End: 2020-07-17

## 2020-01-28 NOTE — Progress Notes (Signed)
Established Patient Office Visit  Subjective:  Patient ID: Janice Massey, female    DOB: 1955/09/21  Age: 65 y.o. MRN: [id number]  CC: DM   HPI Janice Massey presents for continued management of chronic type II DM, HTN. Please see problem based charting for further details.   Past Medical History:  Diagnosis Date  . Atypical chest pain    myoview 10/29/10 - Post-stress EF 56%. Low risk and negative for ischemia   . Back pain   . Back pain   . 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 Chronic PID   . Chronic PID (chronic pelvic inflammatory disease)   . Deaf    Since age 17  . Diabetes mellitus   . Dizziness   . Dyslipidemia   . Dyspnea   . History of Doppler ultrasound 06/20/09   Normal, no prior studies for comparison.   . Hypertension   . Lightheadedness   . Mute   . Obese   . URI (upper respiratory infection)   . Ventricular septal defect 2004 per echo   small membranous VSD    Past Surgical History:  Procedure Laterality Date  . CARDIAC CATHETERIZATION  08/23/95  . CHOLECYSTECTOMY  11/04/1993  . COLPOSCOPY    . TUBAL LIGATION  10/05/1978    Family History  Problem Relation Age of Onset  . Cancer Mother        liver  . Heart attack Mother 34  . Hypertension Father   . Diabetes Father   . Heart attack Father   . CAD Father 28  . Diabetes Paternal Aunt   . Diabetes Maternal Grandfather     Social History   Socioeconomic History  . Marital status: Single    Spouse name: Not on file  . Number of children: 1  . Years of education: Not on file  . Highest education level: Not on file  Occupational History    Employer: UNEMPLOYED  Tobacco Use  . Smoking status: Never Smoker  . Smokeless tobacco: Never Used  Vaping Use  . Vaping Use: Never used  Substance and Sexual Activity  . Alcohol use: No    Alcohol/week: 0.0 standard drinks  . Drug use: No  . Sexual activity: Never    Birth control/protection:  Surgical  Other Topics Concern  . Not on file  Social History Narrative  . Not on file   Social Determinants of Health   Financial Resource Strain: Not on file  Food Insecurity: Not on file  Transportation Needs: Not on file  Physical Activity: Not on file  Stress: Not on file  Social Connections: Not on file  Intimate Partner Violence: Not on file    Outpatient Medications Prior to Visit  Medication Sig Dispense Refill  . cetirizine (ZYRTEC) 10 MG tablet TAKE 1 TABLET(10 MG) BY MOUTH DAILY 90 tablet 1  . fluticasone (FLONASE) 50 MCG/ACT nasal spray SHAKE LIQUID AND USE 2 SPRAYS IN EACH NOSTRIL DAILY 16 g 2  . aspirin 81 MG chewable tablet Chew 1 tablet (81 mg total) by mouth daily. 30 tablet 3  . atorvastatin (LIPITOR) 40 MG tablet TAKE 1 TABLET(40 MG) BY MOUTH DAILY 90 tablet 1  . canagliflozin (INVOKANA) 300 MG TABS tablet TAKE 1 TABLET(300 MG) BY MOUTH DAILY BEFORE BREAKFAST 30 tablet 2  . cyclobenzaprine (FLEXERIL) 10 MG tablet Take 1 tablet (10 mg total) by mouth 3 (three) times daily as needed  for muscle spasms. 30 tablet 0  . diltiazem (CARDIZEM CD) 120 MG 24 hr capsule TAKE 1 CAPSULE(120 MG) BY MOUTH DAILY 90 capsule 1  . losartan (COZAAR) 100 MG tablet Take 1 tablet (100 mg total) by mouth daily. 90 tablet 2  . metFORMIN (GLUCOPHAGE) 1000 MG tablet Take 1 pill twice daily 180 tablet 1  . omeprazole (PRILOSEC) 40 MG capsule TAKE 1 CAPSULE(40 MG) BY MOUTH DAILY 30 capsule 5  . ondansetron (ZOFRAN) 4 MG tablet Take 1 tablet (4 mg total) by mouth every 6 (six) hours as needed for nausea or vomiting. 12 tablet 0  . sitaGLIPtin (JANUVIA) 100 MG tablet TAKE 1 TABLET(100 MG) BY MOUTH DAILY 30 tablet 2  . hydrochlorothiazide (HYDRODIURIL) 25 MG tablet TAKE 1 TABLET(25 MG) BY MOUTH DAILY 30 tablet 5   No facility-administered medications prior to visit.    Allergies  Allergen Reactions  . Codeine Itching  . Lisinopril Cough  . Penicillins Itching    Has patient had a PCN  reaction causing immediate rash, facial/tongue/throat swelling, SOB or lightheadedness with hypotension: YES Has patient had a PCN reaction causing severe rash involving mucus membranes or skin necrosis: NO Has patient had a PCN reaction that required hospitalization NO Has patient had a PCN reaction occurring within the last 10 years: NO If all of the above answers are "NO", then may proceed with Cephalosporin use.    ROS Review of Systems  Constitutional: Negative for activity change, chills, fever and unexpected weight change.  HENT: Negative for sinus pain and trouble swallowing.   Eyes: Negative for visual disturbance.  Respiratory: Negative for cough and shortness of breath.   Cardiovascular: Negative for chest pain, palpitations and leg swelling.  Gastrointestinal: Negative for abdominal pain, constipation and diarrhea.  Endocrine: Negative for polydipsia and polyuria.  Genitourinary: Negative for dysuria, hematuria and vaginal bleeding.  Musculoskeletal: Negative for arthralgias, gait problem and joint swelling.  Skin: Negative for rash.  Neurological: Negative for dizziness, weakness, numbness and headaches.  Psychiatric/Behavioral: Negative for sleep disturbance.      Objective:    Physical Exam Constitutional:      General: She is not in acute distress.    Appearance: Normal appearance.  Eyes:     Conjunctiva/sclera: Conjunctivae normal.  Cardiovascular:     Rate and Rhythm: Normal rate and regular rhythm.  Pulmonary:     Effort: Pulmonary effort is normal.     Breath sounds: Normal breath sounds.  Abdominal:     General: Bowel sounds are normal. There is no distension.     Palpations: Abdomen is soft.     Tenderness: There is no abdominal tenderness.  Musculoskeletal:     Cervical back: Neck supple.     Right lower leg: No edema.     Left lower leg: No edema.  Lymphadenopathy:     Cervical: No cervical adenopathy.  Skin:    General: Skin is warm and dry.   Neurological:     General: No focal deficit present.     Mental Status: She is alert.  Psychiatric:        Mood and Affect: Mood normal.        Behavior: Behavior normal.     BP 128/73 (BP Location: Left Arm, Patient Position: Sitting, Cuff Size: Normal)   Pulse 79   Temp 98.4 F (36.9 C) (Oral)   Wt 190 lb 4.8 oz (86.3 kg)   SpO2 97%   BMI 29.81 kg/m  Wt Readings from  Last 3 Encounters:  01/28/20 190 lb 4.8 oz (86.3 kg)  10/29/19 192 lb 3.2 oz (87.2 kg)  07/30/19 194 lb 6.4 oz (88.2 kg)     Health Maintenance Due  Topic Date Due  . PAP SMEAR-Modifier  07/16/2018  . OPHTHALMOLOGY EXAM  03/08/2019  . MAMMOGRAM  09/21/2019  . COVID-19 Vaccine (3 - Booster for Pfizer series) 10/23/2019    There are no preventive care reminders to display for this patient.  Lab Results  Component Value Date   TSH 1.593 03/31/2010   Lab Results  Component Value Date   WBC 10.1 03/05/2018   HGB 13.4 03/05/2018   HCT 41.9 03/05/2018   MCV 88.8 03/05/2018   PLT 291 03/05/2018   Lab Results  Component Value Date   NA 141 01/28/2020   K 3.8 01/28/2020   CO2 25 01/28/2020   GLUCOSE 98 01/28/2020   BUN 16 01/28/2020   CREATININE 0.93 01/28/2020   BILITOT 1.0 03/05/2018   ALKPHOS 72 03/05/2018   AST 27 03/05/2018   ALT 30 03/05/2018   PROT 7.8 03/05/2018   ALBUMIN 4.2 03/05/2018   CALCIUM 9.6 01/28/2020   ANIONGAP 11 03/05/2018   Lab Results  Component Value Date   CHOL 140 10/10/2013   Lab Results  Component Value Date   HDL 55 10/10/2013   Lab Results  Component Value Date   LDLCALC 51 10/10/2013   Lab Results  Component Value Date   TRIG 172 (H) 10/10/2013   Lab Results  Component Value Date   CHOLHDL 2.5 10/10/2013   Lab Results  Component Value Date   HGBA1C 7.6 (A) 01/28/2020      Assessment & Plan:   Problem List Items Addressed This Visit      Cardiovascular and Mediastinum   Essential hypertension (Chronic)    This problem is chronic and  stable. Continue current management with Losartan and HCTZ. BMP today shows stable renal function and electrolytes.       Relevant Medications   hydrochlorothiazide (HYDRODIURIL) 25 MG tablet   Other Relevant Orders   BMP8+Anion Gap (Completed)     Endocrine   Diabetes ([place]) - Primary (Chronic)    A1C improved from 8.4>7.6. I commended her for her efforts, as she did not want to add any additional medications (particularly injectables). We will continue current management. Consider adding sulfonylurea if glycemic control worsens.  She has eye exam coming up in a couple of months.  She requests 6 month follow-ups since all of her conditions are stable which I think is reasonable.       Relevant Orders   POC Hbg A1C (Completed)   Glucose, capillary (Completed)     Other   Healthcare maintenance    She is getting her mammogram next month. Otherwise UTD on health maintenance screenings.          Meds ordered this encounter  Medications  . hydrochlorothiazide (HYDRODIURIL) 25 MG tablet    Sig: TAKE 1 TABLET(25 MG) BY MOUTH DAILY    Dispense:  30 tablet    Refill:  5    Follow-up: Return in about 6 months (around 07/27/2020) for HTN, DM .    Delice Bison, DO

## 2020-01-28 NOTE — Patient Instructions (Addendum)
Ms. Capes, It was great seeing you!  Congrats on the improvement in your A1C. Keep up the great work and continue your medications as prescribe.   I will check in on your kidney function today and let you know if we need to make any changes.   I'm glad you are getting your mammogram next month.   Since you are doing well, we will plan to see you in 6 months!  Please call sooner if something comes up.   Take care, Dr. Koleen Distance

## 2020-01-29 LAB — BMP8+ANION GAP
Anion Gap: 15 mmol/L (ref 10.0–18.0)
BUN/Creatinine Ratio: 17 (ref 12–28)
BUN: 16 mg/dL (ref 8–27)
CO2: 25 mmol/L (ref 20–29)
Calcium: 9.6 mg/dL (ref 8.7–10.3)
Chloride: 101 mmol/L (ref 96–106)
Creatinine, Ser: 0.93 mg/dL (ref 0.57–1.00)
GFR calc Af Amer: 76 mL/min/{1.73_m2} (ref >59)
GFR calc non Af Amer: 66 mL/min/{1.73_m2} (ref >59)
Glucose: 98 mg/dL (ref 65–99)
Potassium: 3.8 mmol/L (ref 3.5–5.2)
Sodium: 141 mmol/L (ref 134–144)

## 2020-01-30 ENCOUNTER — Encounter: Payer: Self-pay | Admitting: Internal Medicine

## 2020-01-30 DIAGNOSIS — Z0000 Encounter for general adult medical examination without abnormal findings: Secondary | ICD-10-CM | POA: Insufficient documentation

## 2020-01-30 NOTE — Assessment & Plan Note (Signed)
She is getting her mammogram next month. Otherwise UTD on health maintenance screenings.

## 2020-01-30 NOTE — Assessment & Plan Note (Signed)
A1C improved from 8.4>7.6. I commended her for her efforts, as she did not want to add any additional medications (particularly injectables). We will continue current management. Consider adding sulfonylurea if glycemic control worsens.  She has eye exam coming up in a couple of months.  She requests 6 month follow-ups since all of her conditions are stable which I think is reasonable.

## 2020-01-30 NOTE — Assessment & Plan Note (Signed)
This problem is chronic and stable. Continue current management with Losartan and HCTZ. BMP today shows stable renal function and electrolytes.

## 2020-02-01 NOTE — Progress Notes (Signed)
Internal Medicine Clinic Attending  Case discussed with Dr. Bloomfield  At the time of the visit.  We reviewed the resident's history and exam and pertinent patient test results.  I agree with the assessment, diagnosis, and plan of care documented in the resident's note.  

## 2020-02-04 ENCOUNTER — Encounter: Payer: Medicaid Other | Admitting: Obstetrics and Gynecology

## 2020-02-12 ENCOUNTER — Inpatient Hospital Stay: Admission: RE | Admit: 2020-02-12 | Payer: Medicaid Other | Source: Ambulatory Visit

## 2020-02-15 ENCOUNTER — Ambulatory Visit
Admission: RE | Admit: 2020-02-15 | Discharge: 2020-02-15 | Disposition: A | Discharge status: Home / Self Care | Payer: Medicaid Other | Source: Ambulatory Visit | Attending: Internal Medicine | Admitting: Internal Medicine

## 2020-02-15 ENCOUNTER — Other Ambulatory Visit: Payer: Medicaid Other

## 2020-02-15 ENCOUNTER — Other Ambulatory Visit: Payer: Self-pay

## 2020-02-15 DIAGNOSIS — N6323 Unspecified lump in the left breast, lower outer quadrant: Secondary | ICD-10-CM

## 2020-02-15 DIAGNOSIS — R922 Inconclusive mammogram: Secondary | ICD-10-CM | POA: Diagnosis not present

## 2020-02-15 DIAGNOSIS — N6489 Other specified disorders of breast: Secondary | ICD-10-CM | POA: Diagnosis not present

## 2020-02-29 ENCOUNTER — Other Ambulatory Visit (HOSPITAL_COMMUNITY)
Admission: RE | Admit: 2020-02-29 | Discharge: 2020-02-29 | Disposition: A | Discharge status: Home / Self Care | Payer: Medicaid Other | Source: Ambulatory Visit | Attending: Obstetrics and Gynecology | Admitting: Obstetrics and Gynecology

## 2020-02-29 ENCOUNTER — Ambulatory Visit (INDEPENDENT_AMBULATORY_CARE_PROVIDER_SITE_OTHER): Payer: Medicaid Other | Admitting: Obstetrics and Gynecology

## 2020-02-29 ENCOUNTER — Encounter: Payer: Self-pay | Admitting: Obstetrics and Gynecology

## 2020-02-29 ENCOUNTER — Other Ambulatory Visit: Payer: Self-pay

## 2020-02-29 VITALS — BP 139/79 | HR 93 | Ht 67.0 in | Wt 189.9 lb

## 2020-02-29 DIAGNOSIS — N950 Postmenopausal bleeding: Secondary | ICD-10-CM

## 2020-02-29 DIAGNOSIS — N882 Stricture and stenosis of cervix uteri: Secondary | ICD-10-CM | POA: Insufficient documentation

## 2020-02-29 HISTORY — PX: ENDOMETRIAL BIOPSY: PRO73

## 2020-02-29 NOTE — Procedures (Signed)
Endometrial Biopsy Procedure Note (Attempt)  Pre-operative Diagnosis: h/o PMB  Post-operative Diagnosis: same. Cervical stenosis  Procedure Details  Urine pregnancy test was not done.  The risks (including infection, bleeding, pain, and uterine perforation) and benefits of the procedure were explained to the patient and Written informed consent was obtained.  The patient was placed in the dorsal lithotomy position.  Bimanual exam showed the uterus to be in the neutral position.  A Pederson speculum inserted in the vagina, and the cervix was visualized and a pap smear performed. The cervix was then prepped with povidone iodine, and a sharp tenaculum was applied to the anterior lip of the cervix for stabilization.  A pipelle was inserted into but only to a depth of 2.5cm, which is likely the internal os.  No sampling done.  Procedure discontinued due to discomfort.   Condition: Stable  Complications: None  Plan: The patient was advised to call for any fever or for prolonged or severe pain or bleeding. She was advised to use OTC analgesics as needed for mild to moderate pain.   Plan is to f/u pap, vaginal swab, and get a pelvic u/s.   Durene Romans MD Attending Center for [employer] [facility])

## 2020-02-29 NOTE — Progress Notes (Signed)
Obstetrics and Gynecology New Patient Evaluation  Appointment Date: 02/29/2020  OBGYN Clinic: Center for [facility]  Primary Care Provider: Modena Nunnery D  Referring Provider: Sid Falcon, MD  Chief Complaint:  Chief Complaint  Patient presents with  . Gynecologic Exam    History of Present Illness: Janice Massey is a 65 y.o. African-American [id number] (LMP:  In her 82s), seen for the above chief complaint.   2-3d of very light bleeding in October that was a little crampy. None since then and first time since 2015 when we saw her for this and she had a negative pap, hpv, embx (atrophic endometrium on pathology) and 11mm ES on u/s. Patient hasn't been sexually active for at least a year.   Review of Systems: Pertinent items noted in HPI and remainder of comprehensive ROS otherwise negative.   Past Medical History:  Past Medical History:  Diagnosis Date  . Atypical chest pain    myoview 10/29/10 - Post-stress EF 56%. Low risk and negative for ischemia   . Back pain   . 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 Chronic PID (chronic pelvic inflammatory disease)   . Deaf    Since age 86  . Diabetes mellitus   . Dizziness   . Dyslipidemia   . Dyspnea   . History of Doppler ultrasound 06/20/09   Normal, no prior studies for comparison.   . Hypertension   . Lightheadedness   . Mute   . Obese   . URI (upper respiratory infection)   . Ventricular septal defect 2004 per echo   small membranous VSD    Past Surgical History:  Past Surgical History:  Procedure Laterality Date  . CARDIAC CATHETERIZATION  08/23/95  . CHOLECYSTECTOMY  11/04/1993  . COLPOSCOPY    . TUBAL LIGATION  10/05/1978    Past Obstetrical History:  OB History  Gravida Para Term Preterm AB Living  2 2 2     2   SAB IAB Ectopic Multiple Live Births               # Outcome Date GA Lbr Len/2nd Weight Sex Delivery Anes PTL Lv   2 Term           1 Term             Obstetric Comments  Vaginal delivery x 2    Past Gynecological History: As per HPI. No h/o HRT  Social History:  Social History   Socioeconomic History  . Marital status: Single    Spouse name: Not on file  . Number of children: 1  . Years of education: Not on file  . Highest education level: Not on file  Occupational History    Employer: UNEMPLOYED  Tobacco Use  . Smoking status: Never Smoker  . Smokeless tobacco: Never Used  Vaping Use  . Vaping Use: Never used  Substance and Sexual Activity  . Alcohol use: No    Alcohol/week: 0.0 standard drinks  . Drug use: No  . Sexual activity: Never    Birth control/protection: Surgical  Other Topics Concern  . Not on file  Social History Narrative  . Not on file   Social Determinants of Health   Financial Resource Strain: Not on file  Food Insecurity: Not on file  Transportation Needs: Not on file  Physical Activity: Not on file  Stress: Not on file  Social Connections: Not on file  Intimate Partner Violence: Not on file    Family History:  Family History  Problem Relation Age of Onset  . Cancer Mother        liver  . Heart attack Mother 33  . Hypertension Father   . Diabetes Father   . Heart attack Father   . CAD Father 69  . Diabetes Paternal Aunt   . Diabetes Maternal Grandfather     Medications LouAnn Holzworth had no medications administered during this visit. Current Outpatient Medications  Medication Sig Dispense Refill  . aspirin 81 MG chewable tablet Chew 1 tablet (81 mg total) by mouth daily. 30 tablet 3  . atorvastatin (LIPITOR) 40 MG tablet TAKE 1 TABLET(40 MG) BY MOUTH DAILY 90 tablet 1  . canagliflozin (INVOKANA) 300 MG TABS tablet TAKE 1 TABLET(300 MG) BY MOUTH DAILY BEFORE BREAKFAST 30 tablet 2  . cetirizine (ZYRTEC) 10 MG tablet TAKE 1 TABLET(10 MG) BY MOUTH DAILY 90 tablet 1  . cyclobenzaprine (FLEXERIL) 10 MG tablet Take 1 tablet (10 mg total) by mouth  3 (three) times daily as needed for muscle spasms. 30 tablet 0  . diltiazem (CARDIZEM CD) 120 MG 24 hr capsule TAKE 1 CAPSULE(120 MG) BY MOUTH DAILY 90 capsule 1  . fluticasone (FLONASE) 50 MCG/ACT nasal spray SHAKE LIQUID AND USE 2 SPRAYS IN EACH NOSTRIL DAILY 16 g 2  . hydrochlorothiazide (HYDRODIURIL) 25 MG tablet TAKE 1 TABLET(25 MG) BY MOUTH DAILY 30 tablet 5  . losartan (COZAAR) 100 MG tablet Take 1 tablet (100 mg total) by mouth daily. 90 tablet 2  . metFORMIN (GLUCOPHAGE) 1000 MG tablet Take 1 pill twice daily 180 tablet 1  . omeprazole (PRILOSEC) 40 MG capsule TAKE 1 CAPSULE(40 MG) BY MOUTH DAILY 30 capsule 5  . ondansetron (ZOFRAN) 4 MG tablet Take 1 tablet (4 mg total) by mouth every 6 (six) hours as needed for nausea or vomiting. 12 tablet 0  . sitaGLIPtin (JANUVIA) 100 MG tablet TAKE 1 TABLET(100 MG) BY MOUTH DAILY 30 tablet 2   No current facility-administered medications for this visit.    Allergies Codeine, Lisinopril, and Penicillins   Physical Exam:  BP 139/79   Pulse 93   Ht 5\' 7"  (1.702 m)   Wt 189 lb 14.4 oz (86.1 kg)   BMI 29.74 kg/m  Body mass index is 29.74 kg/m. General appearance: Well nourished, well developed female in no acute distress.  Respiratory:  Normal respiratory effort Abdomen:  no masses, hernias; diffusely non tender to palpation, non distended Neuro/Psych:  Normal mood and affect.  Skin:  Warm and dry.  Lymphatic:  No inguinal lymphadenopathy.   Pelvic exam: is not limited by body habitus EGBUS: moderate atrophy; within normal limits Vagina: moderate atrophy; no blood or d/c in vault Cervix: moderate atrophy. Normal. +stenosis (see below) Uterus:  nonenlarged and non tender Adnexa:  normal adnexa and no mass, fullness, tenderness Rectovaginal: deferred  See procedure note for attempted endometrial biopsy  Laboratory: none  Radiology: none  Assessment: pt stabe  Plan:  1. Postmenopausal bleeding Given cervical stenosis,  recommend transvag u/s. If thin, uniform and avascular stripe and no more bleeding, I told her nothing further to do. If she needs sampling, I told her we could try again in the office, but she would like it done in the OR - Cytology - PAP( [place]) - Cervicovaginal ancillary only( [place]) - US PELVIC COMPLETE WITH TRANSVAGINAL; Future  2. Cervical stenosis (uterine cervix)  Orders Placed This Encounter  Procedures  . US PELVIC COMPLETE WITH TRANSVAGINAL    RTC after u/s  Durene Romans MD Attending Center for [facility])

## 2020-03-03 ENCOUNTER — Other Ambulatory Visit: Payer: Self-pay | Admitting: Internal Medicine

## 2020-03-03 LAB — CERVICOVAGINAL ANCILLARY ONLY
Bacterial Vaginitis (gardnerella): NEGATIVE
Candida Glabrata: NEGATIVE
Candida Vaginitis: NEGATIVE
Chlamydia: NEGATIVE
Comment: NEGATIVE
Comment: NEGATIVE
Comment: NEGATIVE
Comment: NEGATIVE
Comment: NEGATIVE
Comment: NORMAL
Neisseria Gonorrhea: NEGATIVE
Trichomonas: NEGATIVE

## 2020-03-04 ENCOUNTER — Other Ambulatory Visit: Payer: Self-pay | Admitting: *Deleted

## 2020-03-04 LAB — CYTOLOGY - PAP
Comment: NEGATIVE
Diagnosis: NEGATIVE
High risk HPV: NEGATIVE

## 2020-03-05 MED ORDER — FLUTICASONE PROPIONATE 50 MCG/ACT NA SUSP
NASAL | 2 refills | Status: DC
Start: 2020-03-05 — End: 2020-07-17

## 2020-03-07 ENCOUNTER — Ambulatory Visit
Admission: RE | Admit: 2020-03-07 | Discharge: 2020-03-07 | Disposition: A | Discharge status: Home / Self Care | Payer: Medicaid Other | Source: Ambulatory Visit | Attending: Obstetrics and Gynecology | Admitting: Obstetrics and Gynecology

## 2020-03-07 DIAGNOSIS — N950 Postmenopausal bleeding: Secondary | ICD-10-CM | POA: Diagnosis not present

## 2020-03-10 ENCOUNTER — Ambulatory Visit (INDEPENDENT_AMBULATORY_CARE_PROVIDER_SITE_OTHER): Payer: Medicaid Other | Admitting: Obstetrics and Gynecology

## 2020-03-10 ENCOUNTER — Other Ambulatory Visit: Payer: Self-pay

## 2020-03-10 ENCOUNTER — Other Ambulatory Visit: Payer: Self-pay | Admitting: Internal Medicine

## 2020-03-10 ENCOUNTER — Encounter: Payer: Self-pay | Admitting: Obstetrics and Gynecology

## 2020-03-10 VITALS — BP 129/87 | HR 88 | Ht 67.0 in | Wt 189.5 lb

## 2020-03-10 DIAGNOSIS — N888 Other specified noninflammatory disorders of cervix uteri: Secondary | ICD-10-CM

## 2020-03-10 DIAGNOSIS — N882 Stricture and stenosis of cervix uteri: Secondary | ICD-10-CM | POA: Diagnosis not present

## 2020-03-10 DIAGNOSIS — E1129 Type 2 diabetes mellitus with other diabetic kidney complication: Secondary | ICD-10-CM

## 2020-03-10 DIAGNOSIS — N950 Postmenopausal bleeding: Secondary | ICD-10-CM

## 2020-03-12 ENCOUNTER — Telehealth: Payer: Self-pay | Admitting: Lactation Services

## 2020-03-12 NOTE — Progress Notes (Signed)
Obstetrics and Gynecology Visit Return Patient Evaluation  Appointment Date: 03/10/2020  Primary Care Provider: [place] [place] Clinic: [facility] for [facility]  Chief Complaint: f/u ultrasound  History of Present Illness:  Janice Massey is a 65 y.o. seen by me on 2/25 for h/o PMB. embx attempted but she had cervical stenosis. Pap obtained and was negative with +TZ seen. Pt set up for u/s and f/u visit  Interval History: Since that time, she states that she continues to have no bleeding. U/s on 3/4 showed 87m ES and appeared normal and otherwise normal u/s with a 1-2cm mass seen at the cervix (see below).   Review of Systems:  as noted in the History of Present Illness.  Patient Active Problem List   Diagnosis Date Noted  . Cervical stenosis (uterine cervix) 02/29/2020  . Healthcare maintenance 01/30/2020  . Breast lump on left side at 5 o'clock position 07/31/2019  . Acute rhinosinusitis 05/24/2019  . Need for immunization against influenza 10/30/2017  . 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 Post-menopausal bleeding 10/01/2013  . VSD (ventricular septal defect), perimembranous 05/29/2013  . Encounter for screening colonoscopy 11/09/2011  . Essential hypertension 08/10/2011  . Hyperlipidemia 10/11/2007  . Hearing loss 11/10/2005  . Asthma 11/10/2005  . GERD 11/10/2005  . Diabetes ([place]) 04/05/2002   Medications:  Harlon Ditty had no medications administered during this visit. Current Outpatient Medications  Medication Sig Dispense Refill  . aspirin 81 MG chewable tablet Chew 1 tablet (81 mg total) by mouth daily. 30 tablet 3  . atorvastatin (LIPITOR) 40 MG tablet TAKE 1 TABLET(40 MG) BY MOUTH DAILY 90 tablet 1  . cetirizine (ZYRTEC) 10 MG tablet TAKE 1 TABLET(10 MG) BY MOUTH DAILY 90 tablet 1  . cyclobenzaprine (FLEXERIL) 10 MG tablet Take 1 tablet (10 mg total) by mouth 3 (three) times daily as needed for muscle spasms. 30 tablet 0  . diltiazem  (CARDIZEM CD) 120 MG 24 hr capsule TAKE 1 CAPSULE(120 MG) BY MOUTH DAILY 90 capsule 1  . fluticasone (FLONASE) 50 MCG/ACT nasal spray SHAKE LIQUID AND USE 2 SPRAYS IN EACH NOSTRIL DAILY 16 g 2  . hydrochlorothiazide (HYDRODIURIL) 25 MG tablet TAKE 1 TABLET(25 MG) BY MOUTH DAILY 30 tablet 5  . losartan (COZAAR) 100 MG tablet Take 1 tablet (100 mg total) by mouth daily. 90 tablet 2  . metFORMIN (GLUCOPHAGE) 1000 MG tablet Take 1 pill twice daily 180 tablet 1  . omeprazole (PRILOSEC) 40 MG capsule TAKE 1 CAPSULE(40 MG) BY MOUTH DAILY 30 capsule 5  . ondansetron (ZOFRAN) 4 MG tablet Take 1 tablet (4 mg total) by mouth every 6 (six) hours as needed for nausea or vomiting. 12 tablet 0  . sitaGLIPtin (JANUVIA) 100 MG tablet TAKE 1 TABLET(100 MG) BY MOUTH DAILY 30 tablet 5  . INVOKANA 300 MG TABS tablet TAKE 1 TABLET(300 MG) BY MOUTH DAILY BEFORE BREAKFAST 30 tablet 2   No current facility-administered medications for this visit.    Allergies: is allergic to codeine, lisinopril, and penicillins.  Physical Exam:  BP 129/87   Pulse 88   Ht 5\' 7"  (1.702 m)   Wt 189 lb 8 oz (86 kg)   BMI 29.68 kg/m  Body mass index is 29.68 kg/m. General appearance: Well nourished, well developed female in no acute distress.  Neuro/Psych:  Normal mood and affect.    Radiology: Narrative & Impression  CLINICAL DATA:  Postmenopausal bleeding  EXAM: TRANSABDOMINAL AND TRANSVAGINAL ULTRASOUND OF PELVIS  TECHNIQUE:  Both transabdominal and transvaginal ultrasound examinations of the pelvis were performed. Transabdominal technique was performed for global imaging of the pelvis including uterus, ovaries, adnexal regions, and pelvic cul-de-sac. It was necessary to proceed with endovaginal exam following the transabdominal exam to visualize the uterus, endometrium, and ovaries.  COMPARISON:  08/08/2013  FINDINGS: Uterus  Measurements: 5.6 x 2.1 x 3.8 cm = volume: 24 mL. Atrophic. Mildly heterogeneous  myometrium. Heterogeneous area of masslike echogenicity at the cervix 15 x 10 x 22 mm, uncertain if represents a cervical mass or heterogeneous cervical tissue.  Endometrium  Thickness: 2 mm.  No endometrial fluid or focal abnormality  Right ovary  Measurements: 2.4 x 1.4 x 1.1 cm = volume: 1.9 mL. Normal morphology without mass  Left ovary  Not visualized, likely obscured by bowel  Other findings  Trace free pelvic fluid.  No adnexal masses.  IMPRESSION: Normal appearing endometrial complex and RIGHT ovary with nonvisualization of LEFT ovary.  Heterogeneous masslike echogenicity at cervix 2.2 cm diameter, may represent heterogeneous cervical tissue but on some images has a more masslike/defined appearance; consider MR imaging of the pelvis with and without contrast to exclude mass.  Nonvisualization of LEFT ovary.  No other pelvic sonographic abnormalities identified.   Electronically Signed   By: Lavonia Dana M.D.   On: 03/07/2020 16:14    Assessment: pt stable  Plan:  1. Cervical mass I told her that I don't see what they're seeing when I review the images but recommend getting an MRI which she is amenable to. If that is also negative and she has no further PMB, I told her that no further work up is needed - [place]; Future  ASL interpreter  RTC: will call pt after MRI results  Durene Romans MD Attending Center for [facility])

## 2020-03-12 NOTE — Telephone Encounter (Signed)
Called Radiology Centralized Scheduling to clarify that MRI on 3/17 needs to be with and without contrast. Order has been updated.   Spoke with Janice Massey who was able to clarify order and changed MRI to with and without contrast.

## 2020-03-20 ENCOUNTER — Ambulatory Visit (HOSPITAL_COMMUNITY): Payer: Medicaid Other

## 2020-03-26 DIAGNOSIS — Z791 Long term (current) use of non-steroidal anti-inflammatories (NSAID): Secondary | ICD-10-CM | POA: Diagnosis not present

## 2020-03-26 DIAGNOSIS — M25551 Pain in right hip: Secondary | ICD-10-CM | POA: Diagnosis not present

## 2020-03-26 DIAGNOSIS — M545 Low back pain, unspecified: Secondary | ICD-10-CM | POA: Diagnosis not present

## 2020-03-28 ENCOUNTER — Encounter (HOSPITAL_COMMUNITY): Payer: Self-pay | Admitting: Emergency Medicine

## 2020-03-28 ENCOUNTER — Other Ambulatory Visit: Payer: Self-pay

## 2020-03-28 ENCOUNTER — Inpatient Hospital Stay (HOSPITAL_COMMUNITY)
Admission: EM | Admit: 2020-03-28 | Discharge: 2020-03-30 | DRG: 390 | Disposition: A | Discharge status: Home / Self Care | Payer: Self-pay | Attending: Family Medicine | Admitting: Family Medicine

## 2020-03-28 ENCOUNTER — Emergency Department (HOSPITAL_COMMUNITY): Payer: Self-pay

## 2020-03-28 ENCOUNTER — Ambulatory Visit (HOSPITAL_COMMUNITY)
Admission: RE | Admit: 2020-03-28 | Discharge: 2020-03-28 | Disposition: A | Discharge status: Home / Self Care | Payer: Medicaid Other | Source: Ambulatory Visit | Attending: Obstetrics and Gynecology | Admitting: Obstetrics and Gynecology

## 2020-03-28 DIAGNOSIS — N889 Noninflammatory disorder of cervix uteri, unspecified: Secondary | ICD-10-CM | POA: Diagnosis not present

## 2020-03-28 DIAGNOSIS — N888 Other specified noninflammatory disorders of cervix uteri: Secondary | ICD-10-CM | POA: Insufficient documentation

## 2020-03-28 DIAGNOSIS — Z8249 Family history of ischemic heart disease and other diseases of the circulatory system: Secondary | ICD-10-CM

## 2020-03-28 DIAGNOSIS — Z20822 Contact with and (suspected) exposure to covid-19: Secondary | ICD-10-CM | POA: Diagnosis present

## 2020-03-28 DIAGNOSIS — D649 Anemia, unspecified: Secondary | ICD-10-CM | POA: Diagnosis present

## 2020-03-28 DIAGNOSIS — Z833 Family history of diabetes mellitus: Secondary | ICD-10-CM

## 2020-03-28 DIAGNOSIS — I959 Hypotension, unspecified: Secondary | ICD-10-CM | POA: Diagnosis present

## 2020-03-28 DIAGNOSIS — Z9049 Acquired absence of other specified parts of digestive tract: Secondary | ICD-10-CM

## 2020-03-28 DIAGNOSIS — Z90710 Acquired absence of both cervix and uterus: Secondary | ICD-10-CM

## 2020-03-28 DIAGNOSIS — R109 Unspecified abdominal pain: Secondary | ICD-10-CM | POA: Diagnosis present

## 2020-03-28 DIAGNOSIS — E781 Pure hyperglyceridemia: Secondary | ICD-10-CM | POA: Diagnosis present

## 2020-03-28 DIAGNOSIS — K56600 Partial intestinal obstruction, unspecified as to cause: Principal | ICD-10-CM | POA: Diagnosis present

## 2020-03-28 DIAGNOSIS — Z0189 Encounter for other specified special examinations: Secondary | ICD-10-CM

## 2020-03-28 DIAGNOSIS — K56609 Unspecified intestinal obstruction, unspecified as to partial versus complete obstruction: Secondary | ICD-10-CM

## 2020-03-28 DIAGNOSIS — R001 Bradycardia, unspecified: Secondary | ICD-10-CM | POA: Diagnosis present

## 2020-03-28 DIAGNOSIS — R3129 Other microscopic hematuria: Secondary | ICD-10-CM | POA: Diagnosis present

## 2020-03-28 DIAGNOSIS — R300 Dysuria: Secondary | ICD-10-CM | POA: Diagnosis present

## 2020-03-28 LAB — COMPREHENSIVE METABOLIC PANEL
ALT: 18 U/L (ref 0–44)
AST: 19 U/L (ref 15–41)
Albumin: 4.3 g/dL (ref 3.5–5.0)
Alkaline Phosphatase: 55 U/L (ref 38–126)
Anion gap: 7 (ref 5–15)
BUN: 14 mg/dL (ref 8–23)
CO2: 27 mmol/L (ref 22–32)
Calcium: 9.6 mg/dL (ref 8.9–10.3)
Chloride: 106 mmol/L (ref 98–111)
Creatinine, Ser: 0.75 mg/dL (ref 0.44–1.00)
GFR, Estimated: >60 mL/min (ref >60)
Glucose, Bld: 111 mg/dL — ABNORMAL HIGH (ref 70–99)
Potassium: 3.7 mmol/L (ref 3.5–5.1)
Sodium: 140 mmol/L (ref 135–145)
Total Bilirubin: 0.6 mg/dL (ref 0.3–1.2)
Total Protein: 7.3 g/dL (ref 6.5–8.1)

## 2020-03-28 LAB — URINALYSIS, ROUTINE W REFLEX MICROSCOPIC
Bacteria, UA: NONE SEEN
Bilirubin Urine: NEGATIVE
Glucose, UA: NEGATIVE mg/dL
Ketones, ur: NEGATIVE mg/dL
Nitrite: NEGATIVE
Protein, ur: NEGATIVE mg/dL
Specific Gravity, Urine: 1.025 (ref 1.005–1.030)
pH: 5 (ref 5.0–8.0)

## 2020-03-28 LAB — CBC
HCT: 38.5 % (ref 36.0–46.0)
Hemoglobin: 12.6 g/dL (ref 12.0–15.0)
MCH: 31.3 pg (ref 26.0–34.0)
MCHC: 32.7 g/dL (ref 30.0–36.0)
MCV: 95.5 fL (ref 80.0–100.0)
Platelets: 301 10*3/uL (ref 150–400)
RBC: 4.03 MIL/uL (ref 3.87–5.11)
RDW: 13.1 % (ref 11.5–15.5)
WBC: 7.2 10*3/uL (ref 4.0–10.5)
nRBC: 0 % (ref 0.0–0.2)

## 2020-03-28 LAB — LIPASE, BLOOD: Lipase: 23 U/L (ref 11–51)

## 2020-03-28 MED ORDER — GADOBUTROL 1 MMOL/ML IV SOLN
8.0000 mL | Freq: Once | INTRAVENOUS | Status: AC | PRN
  Administered 2020-03-28: 8 mL via INTRAVENOUS

## 2020-03-28 MED ORDER — SODIUM CHLORIDE 0.9 % IV SOLN
1000.0000 mL | INTRAVENOUS | Status: DC
  Administered 2020-03-28: 1000 mL via INTRAVENOUS

## 2020-03-28 MED ORDER — SODIUM CHLORIDE 0.9 % IV BOLUS (SEPSIS)
500.0000 mL | Freq: Once | INTRAVENOUS | Status: AC
  Administered 2020-03-29: 500 mL via INTRAVENOUS

## 2020-03-28 MED ORDER — MORPHINE SULFATE (PF) 4 MG/ML IV SOLN
4.0000 mg | Freq: Once | INTRAVENOUS | Status: AC
  Administered 2020-03-28: 4 mg via INTRAVENOUS
  Filled 2020-03-28: qty 1

## 2020-03-28 MED ORDER — IOHEXOL 300 MG/ML  SOLN
100.0000 mL | Freq: Once | INTRAMUSCULAR | Status: AC | PRN
  Administered 2020-03-28: 100 mL via INTRAVENOUS

## 2020-03-28 MED ORDER — ONDANSETRON HCL 4 MG/2ML IJ SOLN
4.0000 mg | Freq: Once | INTRAMUSCULAR | Status: AC
  Administered 2020-03-28: 4 mg via INTRAVENOUS
  Filled 2020-03-28: qty 2

## 2020-03-28 NOTE — ED Triage Notes (Signed)
Patient with history of abdominal pain for about a week.  She states that she has had some nausea and vomiting.  Last time she vomited was yesterday, but continues with pain.  Patient with history of SBO.

## 2020-03-28 NOTE — H&P (Addendum)
Family Medicine Teaching [facility] Admission History and Physical Service Pager: [phone]  Patient name: Kaliegh Willadsen Medical record number: [id number] Date of birth: 06-May-1955 Age: 65 y.o. Gender: female  Primary Care Provider: Joana Reamer, DO Consultants: None Code Status: FULL  Preferred Emergency Contact: Daughter Maudie [phone]  Chief Complaint: Abdominal Pain  Assessment and Plan: Lalonnie Shaffer is a 65 y.o. female presenting with abdominal pain x5 days. 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   Abdominal Pain 2/2 Early/Partial SBO Abdominal pain x 5 days without current nausea/vomiting though previously had NBNB emesis when symptoms began. Endorses loose stools starting last night and still passing gas. Electrolytes and Cr wnl, no leukocytosis. Lipase WNL making pancreatitis less likely. CT abd/pelv concern for enteritis vs early/partial SBO without perforations. Patient has a history of prior partial/complete small bowel obstructions that seem to resolve with conservative measures (+/- NG tube pending severity), last episode in 06/2017. Risk factors for recurrent obstructions include hx of abdominal/pelvic surgeries (hyseterctomy, cholecystectomy, ventral hernia repair). CT also demonstrated richter type ventral hernia containing a portion of non-obstructed small bowel which could contribute. Additionally, could consider mild viral gastroenteritis (new onset diarrhea with possible enteritis and colon gas fluid levels) eliciting functional small bowel abnormalities. No significant dehydration on exam with non-surgical abdomen. Admit for observation with conservative care for now.  -Admit to FPTS for obvservation, med-surg, attending Dr. Manson Passey -Vitals per floor protocol -NPO -IVF at maintenance rate -IV Zofran 4mg  PRN nausea, vomiting -2mg  IV Morphine q4h PRN moderate  to severe pain, can decrease if improving   -BMP monitor electrolytes   Elevated Blood Pressure: acute, improving. Ranging 112-156/53-92. Most elevated upon arrival to ED, likely 2/2 pain. Has come down since receiving pain medication. Most recent BP 112/59. Patient does not have history of HTN and does not take any daily medications.  -Monitor BP's   Loose Stools: acute, ongoing. Likely 2/2 partial vs. early SBO, however could also consider via viral gastroenteritis. Patient does endorse scant bright red blood when wiping. Rectal examination notable for small external hemorrhoids without any evidence of bleeding or fissure. Last normal bowel movement was earlier this week and patient does report having to strain more making bleeding hemorrhoids most likely. Additionally patient endorsed her stool as "brown, but dark", reassuringly hemoglobin wnl with brown stool on rectal exam. Colon cancer screening with "virtual colonoscopy via CT ab/pelvis" in 10/2018 was unremarkable.  -FOBT collected and pending  -IVF/bowel rest as above -Monitor CBC    Microscopic Hematuria  Dysuria: acute. UA with moderate Hgb, 6-10 RBCs on microscopy via clean catch. Small leukocytes without bacteria seen and nitrite negative. Mucus is present. Suspect that dehydration could be playing a role here. Feel this is less likely UTI though will obtain culture. She intermittently has had microscopic hematuria in the past, dating back to 2009 (unclear if with catheter placement/infection etc). No evidence of renal stone or bladder/renal mass/abnormalities on CT ab/pelvis. Cr wnl.  -F/u urine culture  -Pending results, should repeat and consider urology for cysto/renal referral outpatient if persistent   Hx Hypertriglyceridemia: resolved.  Normal TG level in 2020. Not on medications.    FEN/GI: NPO, IVF  Prophylaxis: Lovenox   Disposition: Med-surg, observation. Anticipate 1-2 days.   History of Present Illness:  Danelia Snodgrass is a 65 y.o. female presenting with abdominal pain.   She reports initially started insidiously on Monday, 3/21.  Walking  seems to make it feel better. Eating makes it worse, ate a sub today.  Emesis for two episodes earlier this week that was "food colored and then yellow" (none today 3/25), denies any hematemesis.  Watery vs loose brown stools started last night (few episodes), prior this starting her last stool was harder and earlier this week (several days ago). Passing gas.  She has been having some broth and tylenol to help soothe her stomach.  Small piece of bright red blood on her tissue after the harder stool earlier this week, no melena or blood mixed into the stool. No blood within the toilet.  Feels similar to previous episodes of partial/full SBO.  She also has been trying to stay hydrated with water.  Endorses dysuria for the past day or so, but denies any urgency, hematuria, frequency, or suprapubic pressure.  Denies any fever. Otherwise has been at her baseline. Reports "always" having trouble with bowels, usually stool every 2-3 days.   Last episode of partial SBO was in 2019, resolved with conservative measures without NG tube placement. She has had a few episodes of partial or complete SBO since 2013. Has not required surgical intervention for it. She has a history of cholecystectomy, hysterectomy, and ventral hernia repair.   ED Course: Vitals stable. 4mg  Morphine x1, 4mg  Zofran x1. NS at 125 mL/hr. UA with moderate hgb, small leukocytes but no bacteria seen. Lipase WNL. CMP with mild hyerglycemia at 111, otherwise normal. CBC grossly normal. CT abd/pelv with findings consistent with enteritis vs early/partial small bowel obstruction.   Review Of Systems: Per HPI with the following additions:   Review of Systems  Constitutional: Negative for fever.  Respiratory: Negative for shortness of breath.   Cardiovascular: Negative for chest pain.  Gastrointestinal: Positive  for abdominal pain, anal bleeding and diarrhea. Negative for constipation.  Genitourinary: Positive for dysuria. Negative for hematuria.     Patient Active Problem List   Diagnosis Date Noted  . Cough 06/10/2019  . Left shoulder pain 06/10/2019  . Otalgia of right ear 01/31/2019  . Vision changes 01/31/2019  . Chest pain of uncertain etiology 10/05/2012  . Hypotension 10/05/2012  . HYPERTRIGLYCERIDEMIA, MILD 12/26/2006  . OBESITY, UNSPECIFIED 12/26/2006    Past Medical History: Past Medical History:  Diagnosis Date  . Small bowel obstruction (HCC) 2013, 2015    Past Surgical History: Past Surgical History:  Procedure Laterality Date  . ABDOMINAL HERNIA REPAIR    . [facility]  . CHOLECYSTECTOMY    . Left ankle surgery      Social History: Social History   Tobacco Use  . Smoking status: Never Smoker  . Smokeless tobacco: Never Used  Vaping Use  . Vaping Use: Never used  Substance Use Topics  . Alcohol use: Yes    Comment: rarely  . Drug use: No   Please also refer to relevant sections of EMR.  Family History: Family History  Problem Relation Age of Onset  . Cancer Mother        Breast? but patient not sure  . Diabetes Father   . Diabetes Sister   . Early death Brother [phone]       aneurysm  . Heart disease Maternal Grandmother   . Diabetes Paternal Grandfather     Allergies and Medications: No Known Allergies No current facility-administered medications on file prior to encounter.   Current Outpatient Medications on File Prior to Encounter  Medication Sig Dispense Refill  .  acetaminophen (TYLENOL) 500 MG tablet Take 500 mg by mouth every 6 (six) hours as needed.    Marland Kitchen ibuprofen (ADVIL) 600 MG tablet Take 1 tablet (600 mg total) by mouth every 6 (six) hours as needed. 30 tablet 0  . [DISCONTINUED] fluticasone (FLONASE) 50 MCG/ACT nasal spray Place 2 sprays into both nostrils daily. 9.9 mL 2    Objective: BP 124/73 (BP  Location: Right Arm)   Pulse (!) 57   Temp 98.2 F (36.8 C) (Oral)   Resp 16   SpO2 100%  Exam: General: Awake, alert, well-appearing, in no distress Eyes: EOMI Neck: Supple Cardiovascular: RRR, without murmur Respiratory: CTAB without wheezing/rhonchi/rales, good air movement  Gastrointestinal: soft, non-distended, tenderness to palpation in b/l lower quadrants without rebound or guarding, midline surgical scar below umbilicus that feels to have underlying scar tissue, appearance of small umbilical hernia that is reducible. Hypoactive bowel sounds throughout.   MSK: Moving all extremities spontaneously, without obvious deformities Derm: warm and dry, abdominal surgical scars as above Neuro: Speaking clearly, following commands, answering questions appropriately, able to move spontaneously Psych: Mood and affect appropriate Rectal: Two small non-bleeding external hemorrhoids appreciated, no anal fissures. Small amount of brown stool retrieved on glove.   Performed by myself, exam chaperoned by Dr. Valarie Merino and Imaging: CBC BMET  Recent Labs  Lab 03/28/20 2024  WBC 7.2  HGB 12.6  HCT 38.5  PLT 301   Recent Labs  Lab 03/28/20 2024  NA 140  K 3.7  CL 106  CO2 27  BUN 14  CREATININE 0.75  GLUCOSE 111*  CALCIUM 9.6     EKG: Pending  CT ABDOMEN PELVIS W CONTRAST  Result Date: 03/28/2020 CLINICAL DATA:  Abdominal pain with nausea and vomiting x1 week. EXAM: CT ABDOMEN AND PELVIS WITH CONTRAST TECHNIQUE: Multidetector CT imaging of the abdomen and pelvis was performed using the standard protocol following bolus administration of intravenous contrast. CONTRAST:  OMNIPAQUE IOHEXOL 300 MG/ML  SOLN COMPARISON:  CT abdomen pelvis October 11, 2018 and June 21, 2016 FINDINGS: Lower chest: No acute abnormality. Normal size heart. No significant pericardial effusion/thickening. Hepatobiliary: No suspicious hepatic lesion. Gallbladder is surgically absent. Similar prominence of  the biliary tree, likely reservoir effect post cholecystectomy. Pancreas: Within normal limits. Spleen: Within normal limits. Adrenals/Urinary Tract: Bilateral adrenal glands are unremarkable. No hydronephrosis. Symmetric enhancement excretion of contrast in the bilateral kidneys. 2.3 cm left interpolar renal cyst. Urinary bladder is grossly unremarkable for degree of distension. Stomach/Bowel: Stomach is distended with enteric contents. There are prominent loops of small bowel in the left hemiabdomen measuring up to 3.1 cm with gentle transition to decompressed bowel which demonstrates wall hyperenhancement in the mid abdomen. Nonobstructed, Richter type ventral small-bowel hernia on image 65/3. Gas fluid levels throughout the colon. Vascular/Lymphatic: Aortic atherosclerosis. No pathologically enlarged abdominal or pelvic lymph nodes. Reproductive: Status post hysterectomy. No adnexal masses. Other: Small fat containing ventral hernia. Prior ventral hernia repair. Musculoskeletal: No acute or significant osseous findings. IMPRESSION: 1. Prominent/mildly dilated loops of small bowel in the left hemiabdomen measuring up to 3.1 cm with gentle transition to decompressed bowel which demonstrates wall hyperenhancement in the mid abdomen. Findings which may reflect enteritis. However, early/partial small bowel obstruction not entirely excluded. 2. Gas fluid levels throughout the colon as can be seen with diarrheal illness. 3. Richter type ventral hernia, containing a portion of nonobstructed small bowel. 4. Aortic atherosclerosis. Aortic Atherosclerosis (ICD10-I70.0). Electronically Signed   By: Maudry Mayhew MD  On: 03/28/2020 23:22     Sabino Dick, DO 03/28/2020, 11:43 PM PGY-1, [place] [facility] FPTS Intern pager: [phone], text pages welcome  FPTS Upper-Level Resident Addendum   I have independently interviewed and examined the patient. I have discussed the above with the original author  and agree with their documentation. My edits for correction/addition/clarification are added. Please see also any attending notes.    Allayne Stack, DO  Family Medicine PGY-3

## 2020-03-28 NOTE — ED Provider Notes (Signed)
[facility] EMERGENCY DEPARTMENT Provider Note   CSN: [id number] Arrival date & time: 03/28/20  1956     History Chief Complaint  Patient presents with  . Abdominal Pain    Brandi Mendez is a 65 y.o. female.  HPI   Patient presents to the ED for evaluation of abdominal pain.  Patient states her symptoms started about a week ago.  Initially it was not too severe.  She has had a couple episodes of nausea and vomiting but nothing in the last day.  She has had increasing pain primarily in her upper abdomen.  Nothing seems to help.  She denies any diarrhea or constipation.  She does have history of prior small bowel obstruction and is concerned that this is what is going on again.  Today the symptoms became more severe so that is why she came to the emergency room this evening.  Past Medical History:  Diagnosis Date  . Small bowel obstruction (HCC) 2013, 2015    Patient Active Problem List   Diagnosis Date Noted  . Cough 06/10/2019  . Left shoulder pain 06/10/2019  . Otalgia of right ear 01/31/2019  . Vision changes 01/31/2019  . Chest pain of uncertain etiology 10/05/2012  . Hypotension 10/05/2012  . HYPERTRIGLYCERIDEMIA, MILD 12/26/2006  . OBESITY, UNSPECIFIED 12/26/2006    Past Surgical History:  Procedure Laterality Date  . ABDOMINAL HERNIA REPAIR    . ABDOMINAL HYSTERECTOMY     [facility]  . CHOLECYSTECTOMY    . Left ankle surgery       OB History   No obstetric history on file.     Family History  Problem Relation Age of Onset  . Cancer Mother        Breast? but patient not sure  . Diabetes Father   . Diabetes Sister   . Early death Brother 23       aneurysm  . Heart disease Maternal Grandmother   . Diabetes Paternal Grandfather     Social History   Tobacco Use  . Smoking status: Never Smoker  . Smokeless tobacco: Never Used  Vaping Use  . Vaping Use: Never used  Substance Use Topics  . Alcohol use: Yes    Comment: rarely   . Drug use: No    Home Medications Prior to Admission medications   Medication Sig Start Date End Date Taking? Authorizing Provider  acetaminophen (TYLENOL) 500 MG tablet Take 500 mg by mouth every 6 (six) hours as needed.    [provider]  ibuprofen (ADVIL) 600 MG tablet Take 1 tablet (600 mg total) by mouth every 6 (six) hours as needed. 02/10/19   Wieters, Hallie C, PA-C  fluticasone (FLONASE) 50 MCG/ACT nasal spray Place 2 sprays into both nostrils daily. 01/24/19 08/07/19  Mullis, Kiersten P, DO    Allergies    Patient has no known allergies.  Review of Systems   Review of Systems  Respiratory: Negative for chest tightness.   Cardiovascular: Negative for chest pain.  Genitourinary: Negative for dysuria.  All other systems reviewed and are negative.   Physical Exam Updated Vital Signs BP 124/73 (BP Location: Right Arm)   Pulse (!) 57   Temp 98.2 F (36.8 C) (Oral)   Resp 16   SpO2 100%   Physical Exam Vitals and nursing note reviewed.  Constitutional:      Appearance: She is well-developed. She is not diaphoretic.  HENT:     Head: Normocephalic and atraumatic.  Right Ear: External ear normal.     Left Ear: External ear normal.  Eyes:     General: No scleral icterus.       Right eye: No discharge.        Left eye: No discharge.     Conjunctiva/sclera: Conjunctivae normal.  Neck:     Trachea: No tracheal deviation.  Cardiovascular:     Rate and Rhythm: Normal rate and regular rhythm.  Pulmonary:     Effort: Pulmonary effort is normal. No respiratory distress.     Breath sounds: Normal breath sounds. No stridor. No wheezing or rales.  Abdominal:     General: A surgical scar is present. Bowel sounds are normal. There is no distension.     Palpations: Abdomen is soft. There is no mass.     Tenderness: There is abdominal tenderness in the epigastric area. There is no guarding or rebound.     Hernia: No hernia is present.  Musculoskeletal:         General: No tenderness.     Cervical back: Neck supple.  Skin:    General: Skin is warm and dry.     Findings: No rash.  Neurological:     Mental Status: She is alert.     Cranial Nerves: No cranial nerve deficit (no facial droop, extraocular movements intact, no slurred speech).     Sensory: No sensory deficit.     Motor: No abnormal muscle tone or seizure activity.     Coordination: Coordination normal.     ED Results / Procedures / Treatments   Labs (all labs ordered are listed, but only abnormal results are displayed) Labs Reviewed  COMPREHENSIVE METABOLIC PANEL - Abnormal; Notable for the following components:      Result Value   Glucose, Bld 111 (*)    All other components within normal limits  URINALYSIS, ROUTINE W REFLEX MICROSCOPIC - Abnormal; Notable for the following components:   APPearance HAZY (*)    Hgb urine dipstick MODERATE (*)    Leukocytes,Ua SMALL (*)    All other components within normal limits  LIPASE, BLOOD  CBC    EKG None  Radiology No results found.  Procedures Procedures   Medications Ordered in ED Medications  sodium chloride 0.9 % bolus 500 mL (has no administration in time range)    Followed by  0.9 %  sodium chloride infusion (1,000 mLs Intravenous New Bag/Given 03/28/20 2212)  morphine 4 MG/ML injection 4 mg (4 mg Intravenous Given 03/28/20 2206)  ondansetron (ZOFRAN) injection 4 mg (4 mg Intravenous Given 03/28/20 2203)    ED Course  I have reviewed the triage vital signs and the nursing notes.  Pertinent labs & imaging results that were available during my care of the patient were reviewed by me and considered in my medical decision making (see chart for details).  Clinical Course as of 03/28/20 2334  Fri Mar 28, 2020  2334 Labs reviewed.  No sign abnormality.  CT scan reviewed.  Cannot rule out early sbo [JK]    Clinical Course User Index [JK] Linwood Dibbles, MD   MDM Rules/Calculators/A&P                          Patient  presented to the ED for evaluation of abdominal pain.  Patient has history of prior bowel obstruction.  She does have tenderness in her upper abdomen.  Initial laboratory tests are unremarkable.  Urinalysis does not  suggest urinary tract infection.  CT scan has been ordered to evaluate for possible obstruction.  Care will be turned over to oncoming team. Final Clinical Impression(s) / ED Diagnoses pending   Linwood Dibbles, MD 03/28/20 2253

## 2020-03-28 NOTE — ED Notes (Signed)
PT to CT.

## 2020-03-28 NOTE — H&P (Incomplete)
Family Medicine Teaching [facility] Admission History and Physical Service Pager: [phone]  Patient name: Brandi Mendez Medical record number: [id number] Date of birth: Sep 07, 1955 Age: 65 y.o. Gender: female  Primary Care Provider: Joana Reamer, DO Consultants: None Code Status: *** which was confirmed with family if patient unable to confirm ***  Preferred Emergency Contact: ***  Chief Complaint: Abdominal Pain  Assessment and Plan: Brandi Mendez is a 65 y.o. female presenting with abdominal pain x***. PMH is significant for small bowel obstruction, hypertriglyceridemia, bradycardia and hypotension.   Abdominal Pain 2/2 Early/Partial SBO Abdominal pain x *** with/without*** associated nausea/vomiting/diarrhea. CT abd/pelv concern for enteritis vs early/partial SBO. Patient has a history of prior small bowel obstructions that seem to resolve with conservative measures. Last hospitalization appears to be in 06/2017 in which patient    FEN/GI: NPO Prophylaxis: Lovenox   Disposition: Med-surg, obvs  History of Present Illness:  Brandi Mendez is a 65 y.o. female presenting with ***   ED Course: Vitals stable. 4mg  Morphine x1, 4mg  Zofran x1. NS at 125 mL/hr. UA with moderate hgb, small leukocytes but no bacteria seen. Lipase WNL. CMP with mild hyerglycemia at 111, otherwise normal. CBC grossly normal. CT abd/pelv with findings consistent with enteritis vs early/partial small bowel obstruction.   Review Of Systems: Per HPI with the following additions:   Review of Systems   Patient Active Problem List   Diagnosis Date Noted  . Cough 06/10/2019  . Left shoulder pain 06/10/2019  . Otalgia of right ear 01/31/2019  . Vision changes 01/31/2019  . Chest pain of uncertain etiology 10/05/2012  . Hypotension 10/05/2012  . HYPERTRIGLYCERIDEMIA, MILD 12/26/2006  . OBESITY, UNSPECIFIED 12/26/2006    Past Medical History: Past Medical History:  Diagnosis Date  . Small  bowel obstruction (HCC) 2013, 2015    Past Surgical History: Past Surgical History:  Procedure Laterality Date  . ABDOMINAL HERNIA REPAIR    . ABDOMINAL HYSTERECTOMY     [facility]  . CHOLECYSTECTOMY    . Left ankle surgery      Social History: Social History   Tobacco Use  . Smoking status: Never Smoker  . Smokeless tobacco: Never Used  Vaping Use  . Vaping Use: Never used  Substance Use Topics  . Alcohol use: Yes    Comment: rarely  . Drug use: No   Please also refer to relevant sections of EMR.  Family History: Family History  Problem Relation Age of Onset  . Cancer Mother        Breast? but patient not sure  . Diabetes Father   . Diabetes Sister   . Early death Brother 51       aneurysm  . Heart disease Maternal Grandmother   . Diabetes Paternal Grandfather     Allergies and Medications: No Known Allergies No current facility-administered medications on file prior to encounter.   Current Outpatient Medications on File Prior to Encounter  Medication Sig Dispense Refill  . acetaminophen (TYLENOL) 500 MG tablet Take 500 mg by mouth every 6 (six) hours as needed.    [facility] ibuprofen (ADVIL) 600 MG tablet Take 1 tablet (600 mg total) by mouth every 6 (six) hours as needed. 30 tablet 0  . [DISCONTINUED] fluticasone (FLONASE) 50 MCG/ACT nasal spray Place 2 sprays into both nostrils daily. 9.9 mL 2    Objective: BP 124/73 (BP Location: Right Arm)   Pulse (!) 57   Temp 98.2 F (36.8 C) (Oral)  Resp 16   SpO2 100%  Exam: General: *** Eyes: *** ENTM: *** Neck: *** Cardiovascular: *** Respiratory: *** Gastrointestinal: *** MSK: *** Derm: *** Neuro: *** Psych: ***  Labs and Imaging: CBC BMET  Recent Labs  Lab 03/28/20 2024  WBC 7.2  HGB 12.6  HCT 38.5  PLT 301   Recent Labs  Lab 03/28/20 2024  NA 140  K 3.7  CL 106  CO2 27  BUN 14  CREATININE 0.75  GLUCOSE 111*  CALCIUM 9.6     EKG: My own interpretation (not copied from electronic  read) ***   CT ABDOMEN PELVIS W CONTRAST  Result Date: 03/28/2020 CLINICAL DATA:  Abdominal pain with nausea and vomiting x1 week. EXAM: CT ABDOMEN AND PELVIS WITH CONTRAST TECHNIQUE: Multidetector CT imaging of the abdomen and pelvis was performed using the standard protocol following bolus administration of intravenous contrast. CONTRAST:  OMNIPAQUE IOHEXOL 300 MG/ML  SOLN COMPARISON:  CT abdomen pelvis October 11, 2018 and June 21, 2016 FINDINGS: Lower chest: No acute abnormality. Normal size heart. No significant pericardial effusion/thickening. Hepatobiliary: No suspicious hepatic lesion. Gallbladder is surgically absent. Similar prominence of the biliary tree, likely reservoir effect post cholecystectomy. Pancreas: Within normal limits. Spleen: Within normal limits. Adrenals/Urinary Tract: Bilateral adrenal glands are unremarkable. No hydronephrosis. Symmetric enhancement excretion of contrast in the bilateral kidneys. 2.3 cm left interpolar renal cyst. Urinary bladder is grossly unremarkable for degree of distension. Stomach/Bowel: Stomach is distended with enteric contents. There are prominent loops of small bowel in the left hemiabdomen measuring up to 3.1 cm with gentle transition to decompressed bowel which demonstrates wall hyperenhancement in the mid abdomen. Nonobstructed, Richter type ventral small-bowel hernia on image 65/3. Gas fluid levels throughout the colon. Vascular/Lymphatic: Aortic atherosclerosis. No pathologically enlarged abdominal or pelvic lymph nodes. Reproductive: Status post hysterectomy. No adnexal masses. Other: Small fat containing ventral hernia. Prior ventral hernia repair. Musculoskeletal: No acute or significant osseous findings. IMPRESSION: 1. Prominent/mildly dilated loops of small bowel in the left hemiabdomen measuring up to 3.1 cm with gentle transition to decompressed bowel which demonstrates wall hyperenhancement in the mid abdomen. Findings which may reflect  enteritis. However, early/partial small bowel obstruction not entirely excluded. 2. Gas fluid levels throughout the colon as can be seen with diarrheal illness. 3. Richter type ventral hernia, containing a portion of nonobstructed small bowel. 4. Aortic atherosclerosis. Aortic Atherosclerosis (ICD10-I70.0). Electronically Signed   By: Maudry Mayhew MD   On: 03/28/2020 23:22     Sabino Dick, DO 03/28/2020, 11:43 PM PGY-1, [facility] Family Medicine FPTS Intern pager: [phone], text pages welcome

## 2020-03-28 NOTE — ED Notes (Signed)
Pt to ct 

## 2020-03-28 NOTE — ED Notes (Signed)
abd pain for 4-5 days n and v  No diarrhea

## 2020-03-29 ENCOUNTER — Encounter (HOSPITAL_COMMUNITY): Payer: Self-pay | Admitting: Family Medicine

## 2020-03-29 ENCOUNTER — Other Ambulatory Visit: Payer: Self-pay

## 2020-03-29 DIAGNOSIS — Z8719 Personal history of other diseases of the digestive system: Secondary | ICD-10-CM

## 2020-03-29 DIAGNOSIS — R109 Unspecified abdominal pain: Secondary | ICD-10-CM | POA: Diagnosis present

## 2020-03-29 DIAGNOSIS — R197 Diarrhea, unspecified: Secondary | ICD-10-CM

## 2020-03-29 DIAGNOSIS — R1030 Lower abdominal pain, unspecified: Secondary | ICD-10-CM

## 2020-03-29 LAB — CBC
HCT: 32.6 % — ABNORMAL LOW (ref 36.0–46.0)
Hemoglobin: 10.8 g/dL — ABNORMAL LOW (ref 12.0–15.0)
MCH: 31.8 pg (ref 26.0–34.0)
MCHC: 33.1 g/dL (ref 30.0–36.0)
MCV: 95.9 fL (ref 80.0–100.0)
Platelets: 246 10*3/uL (ref 150–400)
RBC: 3.4 MIL/uL — ABNORMAL LOW (ref 3.87–5.11)
RDW: 13.3 % (ref 11.5–15.5)
WBC: 7.2 10*3/uL (ref 4.0–10.5)
nRBC: 0 % (ref 0.0–0.2)

## 2020-03-29 LAB — HIV ANTIBODY (ROUTINE TESTING W REFLEX): HIV Screen 4th Generation wRfx: NONREACTIVE

## 2020-03-29 LAB — BASIC METABOLIC PANEL
Anion gap: 4 — ABNORMAL LOW (ref 5–15)
BUN: 10 mg/dL (ref 8–23)
CO2: 23 mmol/L (ref 22–32)
Calcium: 8.6 mg/dL — ABNORMAL LOW (ref 8.9–10.3)
Chloride: 112 mmol/L — ABNORMAL HIGH (ref 98–111)
Creatinine, Ser: 0.69 mg/dL (ref 0.44–1.00)
GFR, Estimated: >60 mL/min (ref >60)
Glucose, Bld: 98 mg/dL (ref 70–99)
Potassium: 3.5 mmol/L (ref 3.5–5.1)
Sodium: 139 mmol/L (ref 135–145)

## 2020-03-29 LAB — RESP PANEL BY RT-PCR (FLU A&B, COVID) ARPGX2
Influenza A by PCR: NEGATIVE
Influenza B by PCR: NEGATIVE
SARS Coronavirus 2 by RT PCR: NEGATIVE

## 2020-03-29 MED ORDER — MORPHINE SULFATE (PF) 2 MG/ML IV SOLN
2.0000 mg | INTRAVENOUS | Status: DC | PRN
Start: 2020-03-29 — End: 2020-03-30
  Administered 2020-03-29 (×3): 2 mg via INTRAVENOUS
  Filled 2020-03-29 (×3): qty 1

## 2020-03-29 MED ORDER — SODIUM CHLORIDE 0.9 % IV SOLN: INTRAVENOUS | Status: DC

## 2020-03-29 MED ORDER — ENOXAPARIN SODIUM 40 MG/0.4ML ~~LOC~~ SOLN
40.0000 mg | Freq: Every day | SUBCUTANEOUS | Status: DC
  Administered 2020-03-29 – 2020-03-30 (×2): 40 mg via SUBCUTANEOUS
  Filled 2020-03-29 (×2): qty 0.4

## 2020-03-29 MED ORDER — SODIUM CHLORIDE 0.9 % IV BOLUS (SEPSIS)
250.0000 mL | Freq: Once | INTRAVENOUS | Status: AC
  Administered 2020-03-29: 250 mL via INTRAVENOUS

## 2020-03-29 MED ORDER — ONDANSETRON HCL 4 MG/2ML IJ SOLN: 4.0000 mg | Freq: Three times a day (TID) | INTRAMUSCULAR | Status: DC | PRN

## 2020-03-29 MED ORDER — DIATRIZOATE MEGLUMINE & SODIUM 66-10 % PO SOLN
90.0000 mL | Freq: Once | ORAL | Status: AC
  Administered 2020-03-29: 90 mL via ORAL
  Filled 2020-03-29: qty 90

## 2020-03-29 NOTE — Plan of Care (Signed)

## 2020-03-29 NOTE — Consult Note (Signed)
Reason for Consult: SBO Referring Physician: Dr. Bridgett Larsson Brandi Mendez is an 65 y.o. female.  HPI: Patient is a 65 year old female, who comes in secondary to abdominal pain.  Patient has history of cholecystectomy hysterectomy and hernia repair in the past.  Patient came into the ER secondary to nausea and vomiting.  She states that she had some abdominal pain which is since gotten better.  Patient states that she did have some diarrhea at home.  She states her last bowel movement of liquid bowel movement was yesterday in the ER.  Patient states she is currently passing gas.  Patient underwent CT scan.  This was significant for a lower midline hernia, small, as well as some dilated loops of small bowel.  There was concern for possible enteritis versus early bowel obstruction, partial small bowel obstruction.  I did review the CT scan images personally.  General surgery was consulted for further evaluation.  Past Medical History:  Diagnosis Date  . Small bowel obstruction (HCC) 2013, 2015    Past Surgical History:  Procedure Laterality Date  . ABDOMINAL HERNIA REPAIR    . ABDOMINAL HYSTERECTOMY     [facility]  . CHOLECYSTECTOMY    . Left ankle surgery      Family History  Problem Relation Age of Onset  . Cancer Mother        Breast? but patient not sure  . Diabetes Father   . Diabetes Sister   . Early death Brother 14       aneurysm  . Heart disease Maternal Grandmother   . Diabetes Paternal Grandfather     Social History:  reports that she has never smoked. She has never used smokeless tobacco. She reports current alcohol use. She reports that she does not use drugs.  Allergies: No Known Allergies  Medications: I have reviewed the patient's current medications.  Results for orders placed or performed during the hospital encounter of 03/28/20 (from the past 48 hour(s))  Urinalysis, Routine w reflex microscopic Urine, Clean Catch     Status: Abnormal   Collection Time:  03/28/20  8:23 PM  Result Value Ref Range   Color, Urine YELLOW YELLOW   APPearance HAZY (A) CLEAR   Specific Gravity, Urine 1.025 1.005 - 1.030   pH 5.0 5.0 - 8.0   Glucose, UA NEGATIVE NEGATIVE mg/dL   Hgb urine dipstick MODERATE (A) NEGATIVE   Bilirubin Urine NEGATIVE NEGATIVE   Ketones, ur NEGATIVE NEGATIVE mg/dL   Protein, ur NEGATIVE NEGATIVE mg/dL   Nitrite NEGATIVE NEGATIVE   Leukocytes,Ua SMALL (A) NEGATIVE   RBC / HPF 6-10 0 - 5 RBC/hpf   WBC, UA 6-10 0 - 5 WBC/hpf   Bacteria, UA NONE SEEN NONE SEEN   Squamous Epithelial / LPF 0-5 0 - 5   Mucus PRESENT     Comment: Performed at [facility], [address] [place], [place] [address]  Lipase, blood     Status: None   Collection Time: 03/28/20  8:24 PM  Result Value Ref Range   Lipase 23 11 - 51 U/L    Comment: Performed at [facility], [address] [place], [place] [address]  Comprehensive metabolic panel     Status: Abnormal   Collection Time: 03/28/20  8:24 PM  Result Value Ref Range   Sodium 140 135 - 145 mmol/L   Potassium 3.7 3.5 - 5.1 mmol/L   Chloride 106 98 - 111 mmol/L   CO2 27  22 - 32 mmol/L   Glucose, Bld 111 (H) 70 - 99 mg/dL    Comment: Glucose reference range applies only to samples taken after fasting for at least 8 hours.   BUN 14 8 - 23 mg/dL   Creatinine, Ser 2.95 0.44 - 1.00 mg/dL   Calcium 9.6 8.9 - 18.8 mg/dL   Total Protein 7.3 6.5 - 8.1 g/dL   Albumin 4.3 3.5 - 5.0 g/dL   AST 19 15 - 41 U/L   ALT 18 0 - 44 U/L   Alkaline Phosphatase 55 38 - 126 U/L   Total Bilirubin 0.6 0.3 - 1.2 mg/dL   GFR, Estimated >41 >66 mL/min    Comment: (NOTE) Calculated using the CKD-EPI Creatinine Equation (2021)    Anion gap 7 5 - 15    Comment: Performed at [facility], [address] [place], [place] [address]  CBC     Status: None   Collection Time: 03/28/20  8:24 PM  Result Value Ref Range   WBC 7.2 4.0 - 10.5 K/uL   RBC 4.03 3.87 - 5.11 MIL/uL   Hemoglobin 12.6 12.0 - 15.0  g/dL   HCT 60.1 09.3 - 23.5 %   MCV 95.5 80.0 - 100.0 fL   MCH 31.3 26.0 - 34.0 pg   MCHC 32.7 30.0 - 36.0 g/dL   RDW 57.3 22.0 - 25.4 %   Platelets 301 150 - 400 K/uL   nRBC 0.0 0.0 - 0.2 %    Comment: Performed at [facility], [address] [place], [place] [address]  Resp Panel by RT-PCR (Flu A&B, Covid) Nasopharyngeal Swab     Status: None   Collection Time: 03/29/20  1:15 AM   Specimen: Nasopharyngeal Swab; Nasopharyngeal(NP) swabs in vial transport medium  Result Value Ref Range   SARS Coronavirus 2 by RT PCR NEGATIVE NEGATIVE    Comment: (NOTE) SARS-CoV-2 target nucleic acids are NOT DETECTED.  The SARS-CoV-2 RNA is generally detectable in upper respiratory specimens during the acute phase of infection. The lowest concentration of SARS-CoV-2 viral copies this assay can detect is 138 copies/mL. A negative result does not preclude SARS-Cov-2 infection and should not be used as the sole basis for treatment or other patient management decisions. A negative result may occur with  improper specimen collection/handling, submission of specimen other than nasopharyngeal swab, presence of viral mutation(s) within the areas targeted by this assay, and inadequate number of viral copies(<138 copies/mL). A negative result must be combined with clinical observations, patient history, and epidemiological information. The expected result is Negative.  Fact Sheet for Patients:  BloggerCourse.com  Fact Sheet for Healthcare Providers:  SeriousBroker.it  This test is no t yet approved or cleared by the [place] FDA and  has been authorized for detection and/or diagnosis of SARS-CoV-2 by FDA under an Emergency Use Authorization (EUA). This EUA will remain  in effect (meaning this test can be used) for the duration of the COVID-19 declaration under Section 564(b)(1) of the Act, 21 U.S.C.section 360bbb-3(b)(1), unless the  authorization is terminated  or revoked sooner.       Influenza A by PCR NEGATIVE NEGATIVE   Influenza B by PCR NEGATIVE NEGATIVE    Comment: (NOTE) The Xpert Xpress SARS-CoV-2/FLU/RSV plus assay is intended as an aid in the diagnosis of influenza from Nasopharyngeal swab specimens and should not be used as a sole basis for treatment. Nasal washings and aspirates are unacceptable for Xpert Xpress SARS-CoV-2/FLU/RSV testing.  Fact Sheet for Patients: BloggerCourse.com  Fact Sheet for Healthcare Providers: SeriousBroker.it  This test is not yet approved or cleared by the [place] FDA and has been authorized for detection and/or diagnosis of SARS-CoV-2 by FDA under an Emergency Use Authorization (EUA). This EUA will remain in effect (meaning this test can be used) for the duration of the COVID-19 declaration under Section 564(b)(1) of the Act, 21 U.S.C. section 360bbb-3(b)(1), unless the authorization is terminated or revoked.  Performed at [facility], [address] [place], [place] [address]   HIV Antibody (routine testing w rflx)     Status: None   Collection Time: 03/29/20  2:44 AM  Result Value Ref Range   HIV Screen 4th Generation wRfx Non Reactive Non Reactive    Comment: Performed at [facility] Lab, [address], [place], [place] [address]  Basic metabolic panel     Status: Abnormal   Collection Time: 03/29/20  9:20 AM  Result Value Ref Range   Sodium 139 135 - 145 mmol/L   Potassium 3.5 3.5 - 5.1 mmol/L   Chloride 112 (H) 98 - 111 mmol/L   CO2 23 22 - 32 mmol/L   Glucose, Bld 98 70 - 99 mg/dL    Comment: Glucose reference range applies only to samples taken after fasting for at least 8 hours.   BUN 10 8 - 23 mg/dL   Creatinine, Ser 8.29 0.44 - 1.00 mg/dL   Calcium 8.6 (L) 8.9 - 10.3 mg/dL   GFR, Estimated >56 >21 mL/min    Comment: (NOTE) Calculated using the CKD-EPI Creatinine Equation  (2021)    Anion gap 4 (L) 5 - 15    Comment: Performed at [facility], [address] [place], [place] [address]  CBC     Status: Abnormal   Collection Time: 03/29/20  9:20 AM  Result Value Ref Range   WBC 7.2 4.0 - 10.5 K/uL   RBC 3.40 (L) 3.87 - 5.11 MIL/uL   Hemoglobin 10.8 (L) 12.0 - 15.0 g/dL   HCT 78.4 (L) 69.6 - 29.5 %   MCV 95.9 80.0 - 100.0 fL   MCH 31.8 26.0 - 34.0 pg   MCHC 33.1 30.0 - 36.0 g/dL   RDW 28.4 13.2 - 44.0 %   Platelets 246 150 - 400 K/uL   nRBC 0.0 0.0 - 0.2 %    Comment: Performed at [facility], [address], [place], [place] [address]    CT ABDOMEN PELVIS W CONTRAST  Result Date: 03/28/2020 CLINICAL DATA:  Abdominal pain with nausea and vomiting x1 week. EXAM: CT ABDOMEN AND PELVIS WITH CONTRAST TECHNIQUE: Multidetector CT imaging of the abdomen and pelvis was performed using the standard protocol following bolus administration of intravenous contrast. CONTRAST:  OMNIPAQUE IOHEXOL 300 MG/ML  SOLN COMPARISON:  CT abdomen pelvis October 11, 2018 and June 21, 2016 FINDINGS: Lower chest: No acute abnormality. Normal size heart. No significant pericardial effusion/thickening. Hepatobiliary: No suspicious hepatic lesion. Gallbladder is surgically absent. Similar prominence of the biliary tree, likely reservoir effect post cholecystectomy. Pancreas: Within normal limits. Spleen: Within normal limits. Adrenals/Urinary Tract: Bilateral adrenal glands are unremarkable. No hydronephrosis. Symmetric enhancement excretion of contrast in the bilateral kidneys. 2.3 cm left interpolar renal cyst. Urinary bladder is grossly unremarkable for degree of distension. Stomach/Bowel: Stomach is distended with enteric contents. There are prominent loops of small bowel in the left hemiabdomen measuring up to 3.1 cm with gentle transition to  decompressed bowel which demonstrates wall hyperenhancement in the mid abdomen. Nonobstructed, Richter type ventral small-bowel  hernia on image 65/3. Gas fluid levels throughout the colon. Vascular/Lymphatic: Aortic atherosclerosis. No pathologically enlarged abdominal or pelvic lymph nodes. Reproductive: Status post hysterectomy. No adnexal masses. Other: Small fat containing ventral hernia. Prior ventral hernia repair. Musculoskeletal: No acute or significant osseous findings. IMPRESSION: 1. Prominent/mildly dilated loops of small bowel in the left hemiabdomen measuring up to 3.1 cm with gentle transition to decompressed bowel which demonstrates wall hyperenhancement in the mid abdomen. Findings which may reflect enteritis. However, early/partial small bowel obstruction not entirely excluded. 2. Gas fluid levels throughout the colon as can be seen with diarrheal illness. 3. Richter type ventral hernia, containing a portion of nonobstructed small bowel. 4. Aortic atherosclerosis. Aortic Atherosclerosis (ICD10-I70.0). Electronically Signed   By: Maudry Mayhew MD   On: 03/28/2020 23:22    Review of Systems  Constitutional: Negative for chills and fever.  HENT: Negative for ear discharge, hearing loss and sore throat.   Eyes: Negative for discharge.  Respiratory: Negative for cough and shortness of breath.   Cardiovascular: Negative for chest pain and leg swelling.  Gastrointestinal: Positive for abdominal pain (upper abd). Negative for constipation, diarrhea, nausea and vomiting.  Musculoskeletal: Negative for myalgias and neck pain.  Skin: Negative for rash.  Allergic/Immunologic: Negative for environmental allergies.  Neurological: Negative for dizziness and seizures.  Hematological: Does not bruise/bleed easily.  Psychiatric/Behavioral: Negative for suicidal ideas.  All other systems reviewed and are negative.  Blood pressure 100/67, pulse (!) 54, temperature 98.2 F (36.8 C), temperature source Oral, resp. rate 15, height 5\' 6"  (1.676 m), weight 79.2 kg, SpO2 98 %. Physical Exam Constitutional:      Appearance: She  is well-developed.     Comments: Conversant No acute distress  Eyes:     General: Lids are normal. No scleral icterus.    Comments: Pupils are equal round and reactive No lid lag Moist conjunctiva  Neck:     Thyroid: No thyromegaly.     Trachea: No tracheal tenderness.     Comments: No cervical lymphadenopathy Cardiovascular:     Rate and Rhythm: Normal rate and regular rhythm.     Heart sounds: No murmur heard.   Pulmonary:     Effort: Pulmonary effort is normal.     Breath sounds: Normal breath sounds. No wheezing or rales.  Abdominal:     General: Bowel sounds are normal.     Palpations: Abdomen is soft.     Tenderness: There is abdominal tenderness in the epigastric area. There is no guarding or rebound.     Hernia: No hernia is present.    Skin:    General: Skin is warm.     Findings: No rash.     Nails: There is no clubbing.     Comments: Normal skin turgor  Neurological:     Mental Status: She is alert and oriented to person, place, and time.     Comments: Normal gait and station  Psychiatric:        Judgment: Judgment normal.     Comments: Appropriate affect     Assessment/Plan: 65 year old female with likely enteritis, possible partial small bowel obstruction 1.  We will hold off on NG tube for now.  I have ordered the small bowel obstruction protocol.  If patient continues to have bowel function in KUB shows contrast in the colon patient can have a liquid diet and advance as tolerated.  2.  We will hold off on surgery at this time. 3.  We will follow along.  Axel Fillerrmando Lakrisha Iseman 03/29/2020, 2:10 PM

## 2020-03-29 NOTE — Discharge Summary (Signed)
Family Medicine Teaching Service [facility] Discharge Summary  Patient name: Brandi Mendez Medical record number: [id number] Date of birth: 10/17/1955 Age: 65 y.o. Gender: female Date of Admission: 03/28/2020  Date of Discharge: 03/30/20 Admitting Physician: Westley Chandler, MD  Primary Care Provider: Joana Reamer, DO Consultants: None  Indication for Hospitalization: Concern for early/partial SBO  Discharge Diagnoses/Problem List:  Abdominal pain 2/2 early vs partial SBO Dysuria Microscopic hematuria  Diarrhea   Disposition: Home  Discharge Condition: Stable  Discharge Exam:   Temp:  [97.8 F (36.6 C)-98.7 F (37.1 C)] 98.7 F (37.1 C) (03/26 2035) Pulse Rate:  [44-54] 50 (03/26 2035) Resp:  [15-18] 18 (03/26 1825) BP: (90-116)/(56-68) 116/68 (03/26 2035) SpO2:  [97 %-99 %] 98 % (03/26 2035)   Physical Exam: General: alert, oriented. No acute distress. Laying in supine position  Cardiovascular: RRR. No murmurs.  Respiratory: LCTAB.  Abdomen: soft. Normal bowel sounds.  Minimal tenderness in the LLQ.   Brief Hospital Course:  Brandi Mendez is a 65 y.o. female presenting with abdominal pain x5 days. PMH is significant for small bowel obstruction, hypertriglyceridemia, bradycardia and hypotension.   Abdominal Pain 2/2 Early/Partial SBO Patient presented with 5 days of abdominal pain. CT abd/pelv revealed concern for enteritis vs. Early/partial SBO. Patient has history of prior abdominal surgeries and SBO's so early/partial SBO thought to be most likely. She was managed conservatively with bowel rest, anti-emetics, fluids and pain relief. Patient tolerated increase in diet with improvement of symptoms on d/c.   Other chronic conditions stable.    Follow Up Recommendations 1. Follow up BP's. BP's elevated up to 150's systolic but normalized by discharge. Has no prior history of HTN. Could have been elevated in the setting of acute pain.  2. History of hematuria on  urine microscopy dating back to 2009. Evaluate further, consider referral to nephrology vs. Urology. 3. Repeat BMP looking for hypokalemia. Recommend potassium rich foods.   Significant Procedures: None  Significant Labs and Imaging:  Recent Labs  Lab 03/28/20 2024 03/29/20 0920 03/30/20 0130  WBC 7.2 7.2 5.5  HGB 12.6 10.8* 10.2*  HCT 38.5 32.6* 31.5*  PLT 301 246 232   Recent Labs  Lab 03/28/20 2024 03/29/20 0920 03/30/20 0130  NA 140 139 141  K 3.7 3.5 3.6  CL 106 112* 114*  CO2 27 23 23   GLUCOSE 111* 98 80  BUN 14 10 7*  CREATININE 0.75 0.69 0.71  CALCIUM 9.6 8.6* 8.5*  ALKPHOS 55  --   --   AST 19  --   --   ALT 18  --   --   ALBUMIN 4.3  --   --    DG Abd Portable 1V-Small Bowel Obstruction Protocol-initial, 8 hr delay  Result Date: 03/30/2020 CLINICAL DATA:  Follow-up small-bowel dilatation EXAM: PORTABLE ABDOMEN - 1 VIEW COMPARISON:  CT from 03/28/2020 FINDINGS: Scattered contrast material is noted throughout the colon. No small bowel dilatation is noted. Postsurgical changes are seen. IMPRESSION: Administered contrast now lies within the colon. No obstructive changes are seen. Electronically Signed   By: 03/30/2020 M.D.   On: 03/30/2020 00:37    Results/Tests Pending at Time of Discharge: FOBT   Discharge Medications:  Allergies as of 03/30/2020   No Known Allergies     Medication List    STOP taking these medications   ibuprofen 600 MG tablet Commonly known as: ADVIL     TAKE these medications   acetaminophen 500 MG  tablet Commonly known as: TYLENOL Take 500 mg by mouth every 6 (six) hours as needed.       Discharge Instructions: Please refer to Patient Instructions section of EMR for full details.  Patient was counseled important signs and symptoms that should prompt return to medical care, changes in medications, dietary instructions, activity restrictions, and follow up appointments.   Follow-Up Appointments:  Follow-up Information     Mullis, Kiersten P, DO Follow up.   Specialty: Family Medicine Contact information: [address] [place] [address] [phone]        Nahser, Deloris Ping, MD .   Specialty: Cardiology Contact information: [address] [place] [address] [phone]               Cora Collum, DO 03/30/2020, 2:53 PM PGY-1, [facility] Family Medicine

## 2020-03-29 NOTE — Hospital Course (Addendum)
Brandi Mendez is a 65 y.o. female presenting with abdominal pain x5 days. PMH is significant for small bowel obstruction, hypertriglyceridemia, bradycardia and hypotension.   Abdominal Pain 2/2 Early/Partial SBO Patient presented with 5 days of abdominal pain. CT abd/pelv revealed concern for enteritis vs. Early/partial SBO. Patient has history of prior abdominal surgeries and SBO's so early/partial SBO thought to be most likely. She was managed conservatively with bowel rest, anti-emetics, fluids and pain relief. Patient tolerated increase in diet with improvement of symptoms on d/c.   Other chronic conditions stable.    Follow Up Recommendations Follow up BP's. BP's elevated up to 150's systolic but normalized by discharge. Has no prior history of HTN. Could have been elevated in the setting of acute pain.  History of hematuria on urine microscopy dating back to 2009. Evaluate further, consider referral to nephrology vs. Urology. Repeat BMP looking for hypokalemia. Recommend potassium rich foods.

## 2020-03-29 NOTE — Plan of Care (Signed)

## 2020-03-29 NOTE — Progress Notes (Addendum)
FPTS Interim Progress Note  S: Went to see pt this afternoon. She was laying comfortably in bed. Denies concerns. LLQ pain has improved.  O: BP 95/60 (BP Location: Right Arm)   Pulse (!) 45   Temp 98.2 F (36.8 C) (Oral)   Resp 16   Ht 5\' 6"  (1.676 m)   Wt 79.2 kg   SpO2 97%   BMI 28.18 kg/m    General: Alert, no acute distress Cardio: well perfused  Pulm: normal work of breathing Abdomen: Bowel sounds normal. Abdomen non distended, soft and tender in RLQ, no guarding. Extremities: No peripheral edema.  Neuro: Cranial nerves grossly intact  A/P: -F/u surgical recs -NPO -Continue mIVF fluids, give further 250cc bolus due to hypotension -Monitor Bps -Monitor abdominal pain   , MD 03/29/2020, 4:18 PM PGY-2, [facility] Family Medicine Service pager [phone]

## 2020-03-29 NOTE — ED Notes (Signed)
Report given to sheila on 5 n

## 2020-03-29 NOTE — Progress Notes (Addendum)
Interim Progress Note  S: Patient is still having some abdominal pain, rates it 4/10. No nausea or vomiting. Has not had bowel movement here yet.    O: Blood pressure (!) 154/52, pulse (!) 47, temperature 97.8 F (36.6 C), temperature source Oral, resp. rate 17, height 5\' 6"  (1.676 m), weight 79.2 kg, SpO2 100 %. Gen: Awake, alert, in no apparent distress Abd: Non-distended, mild tenderness to palpation LLQ and LUQ without rebound or guarding  Ext: No edema, 2+ radial and DP pulses    A/P: 65 y/o female with PMHx SBO admitted for abdominal pain concerning for early/partial SBO vs enteritis.   Abdominal Pain 2/2 early vs. Partial SBO Continues to have some abdominal pain without nausea or vomiting. IV morphine worked well for her in the ED, will continue with this and bowel rest. Given risk factors for progression to SBO, will involve surgery.  -Consult surgery -F/u AM CBC, BMP -Continue pain control with IV Morphine 2 mg q4h PRN  -Zofran q8h PRN nausea, vomiting -Continue bowel rest, advance slowly as improves \  Bradycardia Patient has been bradycardic, ranging 47-60's. Has a hx of sinus bradycardia. Asymptomatic. Has seen cardiology in the past. EKG ordered. -F/u EKG

## 2020-03-29 NOTE — Progress Notes (Signed)
Pt BP dropped to 90/63. HR 44. Charge nurse was notified of yellow mews. Paged the Dr on call. Received call back and was given verbal order to administer 250 bolus. Pt Asymptomatic and sleeping on assessment, states she feels fine. Will implement yellow mews guidelines. Will continue to monitor.    03/29/20 1421  Assess: MEWS Score  Temp 98.7 F (37.1 C)  BP 90/63  Pulse Rate (!) 44  Resp 18  Level of Consciousness Alert  SpO2 97 %  O2 Device Room Air  Assess: MEWS Score  MEWS Temp 0  MEWS Systolic 1  MEWS Pulse 1  MEWS RR 0  MEWS LOC 0  MEWS Score 2  MEWS Score Color Yellow  Assess: if the MEWS score is Yellow or Red  Were vital signs taken at a resting state? Yes  Focused Assessment No change from prior assessment  Early Detection of Sepsis Score *See Row Information* Low  MEWS guidelines implemented *See Row Information* Yes  Treat  MEWS Interventions Administered prn meds/treatments (Bolus fluids)  Take Vital Signs  Increase Vital Sign Frequency  Yellow: Q 2hr X 2 then Q 4hr X 2, if remains yellow, continue Q 4hrs  Escalate  MEWS: Escalate Yellow: discuss with charge nurse/RN and consider discussing with provider and RRT  Notify: Charge Nurse/RN  Name of Charge Nurse/RN Notified Marcelino Duster RN  Date Charge Nurse/RN Notified 03/29/20  Time Charge Nurse/RN Notified 1420  Notify: Provider  Provider Name/Title Lilland  Date Provider Notified 03/29/20  Time Provider Notified 1430  Notification Type Page  Notification Reason Other (Comment) (Pt BP dropepd)  Provider response Other (Comment) (Verbal Order to do 250 Bolus)  Date of Provider Response 03/29/20  Time of Provider Response 1436  Document  Progress note created (see row info) Yes

## 2020-03-30 ENCOUNTER — Inpatient Hospital Stay (HOSPITAL_COMMUNITY): Payer: Self-pay

## 2020-03-30 ENCOUNTER — Other Ambulatory Visit: Payer: Self-pay | Admitting: Internal Medicine

## 2020-03-30 DIAGNOSIS — N183 Chronic kidney disease, stage 3 unspecified: Secondary | ICD-10-CM

## 2020-03-30 DIAGNOSIS — E1122 Type 2 diabetes mellitus with diabetic chronic kidney disease: Secondary | ICD-10-CM

## 2020-03-30 DIAGNOSIS — K56609 Unspecified intestinal obstruction, unspecified as to partial versus complete obstruction: Secondary | ICD-10-CM

## 2020-03-30 LAB — BASIC METABOLIC PANEL
Anion gap: 4 — ABNORMAL LOW (ref 5–15)
BUN: 7 mg/dL — ABNORMAL LOW (ref 8–23)
CO2: 23 mmol/L (ref 22–32)
Calcium: 8.5 mg/dL — ABNORMAL LOW (ref 8.9–10.3)
Chloride: 114 mmol/L — ABNORMAL HIGH (ref 98–111)
Creatinine, Ser: 0.71 mg/dL (ref 0.44–1.00)
GFR, Estimated: >60 mL/min (ref >60)
Glucose, Bld: 80 mg/dL (ref 70–99)
Potassium: 3.6 mmol/L (ref 3.5–5.1)
Sodium: 141 mmol/L (ref 135–145)

## 2020-03-30 LAB — CBC
HCT: 31.5 % — ABNORMAL LOW (ref 36.0–46.0)
Hemoglobin: 10.2 g/dL — ABNORMAL LOW (ref 12.0–15.0)
MCH: 31.8 pg (ref 26.0–34.0)
MCHC: 32.4 g/dL (ref 30.0–36.0)
MCV: 98.1 fL (ref 80.0–100.0)
Platelets: 232 10*3/uL (ref 150–400)
RBC: 3.21 MIL/uL — ABNORMAL LOW (ref 3.87–5.11)
RDW: 13.2 % (ref 11.5–15.5)
WBC: 5.5 10*3/uL (ref 4.0–10.5)
nRBC: 0 % (ref 0.0–0.2)

## 2020-03-30 LAB — TSH: TSH: 1.729 u[IU]/mL (ref 0.350–4.500)

## 2020-03-30 NOTE — Progress Notes (Signed)
Patient has tolerated c/l  diet without difficulty.  States wants more to eat.  Will contact MD for potential further orders.

## 2020-03-30 NOTE — Progress Notes (Signed)
Family Medicine Teaching Service Daily Progress Note Intern Pager: [phone]  Patient name: Brandi Mendez Medical record number: [id number] Date of birth: 08/27/1955 Age: 65 y.o. Gender: female  Primary Care Provider: Joana Reamer, DO Consultants: Surgery Code Status: Full  Pt Overview and Major Events to Date:  3/26-admitted  Assessment and Plan: Brandi Mendez is a 65 y.o. female presenting with abdominal pain x5 days. 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   Abdominal pain likely secondary to resolved partial SBO CT abdomen pelvis showed enteritis versus early/partial SBO.  Surgery consulted and is following.  Currently n.p.o. and getting serial KUBs.  Most recent x-ray showed contrast in the colon with no obstructive changes. -Surgery consulted, appreciate recs -Advance diet as tolerated -Morphine as needed for pain every 4 hours -Zofran as needed for nausea  Elevated blood pressure -resolved No previous hypertensive history.  Most likely due to pain.  Resolved after pain improved.  Bradycardia Heart rate continues to be in the 40s to 50s.  Patient asymptomatic.  TSH normal EKG shows sinus bradycardia with no other abnormalities.  Anemia Initially hemoglobin at 12.6, now at 10.2.  Previous CBCs show hemoglobin consistently in the 10-12 range. -A.m. CBCs -consider further anemia work-up on outpatient follow-up  Microscopic hematuria No abnormalities seen on CT abdomen pelvis, normal creatinine -Follow-up urine culture -Repeat as outpatient  FEN/GI: Clear liquid diet, PPx: Lovenox  Disposition: Home when stable  Subjective:  Patient states her abdominal pain is improved.  Has had a soft, loose bowel movement this morning.  She is hungry  Objective: Temp:  [97.8 F (36.6 C)-98.7 F (37.1 C)] 98.7 F (37.1 C) (03/26 2035) Pulse Rate:  [44-54] 50 (03/26  2035) Resp:  [15-18] 18 (03/26 1825) BP: (90-116)/(56-68) 116/68 (03/26 2035) SpO2:  [97 %-99 %] 98 % (03/26 2035) Physical Exam: General: alert, oriented. No acute distress. Laying in supine position  Cardiovascular: RRR. No murmurs.  Respiratory: LCTAB.  Abdomen: soft. Normal bowel sounds.  Minimal tenderness in the LLQ.   Laboratory: Recent Labs  Lab 03/28/20 2024 03/29/20 0920 03/30/20 0130  WBC 7.2 7.2 5.5  HGB 12.6 10.8* 10.2*  HCT 38.5 32.6* 31.5*  PLT 301 246 232   Recent Labs  Lab 03/28/20 2024 03/29/20 0920 03/30/20 0130  NA 140 139 141  K 3.7 3.5 3.6  CL 106 112* 114*  CO2 27 23 23   BUN 14 10 7*  CREATININE 0.75 0.69 0.71  CALCIUM 9.6 8.6* 8.5*  PROT 7.3  --   --   BILITOT 0.6  --   --   ALKPHOS 55  --   --   ALT 18  --   --   AST 19  --   --   GLUCOSE 111* 98 80    Imaging/Diagnostic Tests: Administered contrast now lies within the colon. No obstructive changes are seen.  , MD 03/30/2020, 5:10 AM PGY-3, [facility] Family Medicine FPTS Intern pager: [phone], text pages welcome

## 2020-03-30 NOTE — Discharge Instructions (Signed)
You likely had a partial obstruction of your small bowel that has resolved.  We have scheduled you a hospital follow up for wed 3/30 at 910am.    Please advance your diet as tolerated.  If you start to develop vomiting and inability to keep food down or develop fever please seek medical attention.      Bowel Obstruction A bowel obstruction means that something is blocking the small or large bowel. The bowel is also called the intestine. It is the long tube that connects the stomach to the opening of the butt (anus). When something blocks the bowel, food and fluids cannot pass through like normal. This condition needs to be treated. Treatment depends on the cause of the problem and how bad the problem is. What are the causes? Common causes of this condition include:  Scar tissue (adhesions) from past surgery or from high-energy X-rays (radiation).  Recent surgery in the belly. This affects how food moves in the bowel.  Some diseases, such as: ? Irritation of the lining of the digestive tract (Crohn's disease). ? Irritation of small pouches in the bowel (diverticulitis).  Growths or tumors.  A bulging organ (hernia).  Twisting of the bowel (volvulus).  A foreign body.  Slipping of a part of the bowel into another part (intussusception).   What are the signs or symptoms? Symptoms of this condition include:  Pain in the belly.  Feeling sick to your stomach (nauseous).  Throwing up (vomiting).  Bloating in the belly.  Being unable to pass gas.  Trouble pooping (constipation).  Watery poop (diarrhea).  A lot of belching. How is this diagnosed? This condition may be diagnosed based on:  A physical exam.  Medical history.  Imaging tests, such as X-ray or CT scan.  Blood tests.  Urine tests. How is this treated? Treatment for this condition may include:  Fluids and pain medicines that are given through an IV tube. Your doctor may tell you not to eat or drink if you  feel sick to your stomach and are throwing up.  Eating a clear liquid diet for a few days.  Putting a small tube (nasogastric tube) into the stomach. This will help with pain, discomfort, and nausea by removing blocked air and fluids from the stomach.  Surgery. This may be needed if other treatments do not work. Follow these instructions at home: Medicines  Take over-the-counter and prescription medicines only as told by your doctor.  If you were prescribed an antibiotic medicine, take it as told by your doctor. Do not stop taking the antibiotic even if you start to feel better. General instructions  Follow your diet as told by your doctor. You may need to: ? Only drink clear liquids until you start to get better. ? Avoid solid foods.  Return to your normal activities as told by your doctor. Ask your doctor what activities are safe for you.  Do not sit for a long time without moving. Get up to take short walks every 1-2 hours. This is important. Ask for help if you feel weak or unsteady.  Keep all follow-up visits as told by your doctor. This is important. How is this prevented? After having a bowel obstruction, you may be more likely to have another. You can do some things to stop it from happening again.  If you have a long-term (chronic) disease, contact your doctor if you see changes or problems.  Take steps to prevent or treat trouble pooping. Your doctor may  ask that you: ? Drink enough fluid to keep your pee (urine) pale yellow. ? Take over-the-counter or prescription medicines. ? Eat foods that are high in fiber. These include beans, whole grains, and fresh fruits and vegetables. ? Limit foods that are high in fat and sugar. These include fried or sweet foods.  Stay active. Ask your doctor which exercises are safe for you.  Avoid stress.  Eat three small meals and three small snacks each day.  Work with a Psychologist, prison and probation services (dietitian) to make a meal plan that works for  you.  Do not use any products that contain nicotine or tobacco, such as cigarettes and e-cigarettes. If you need help quitting, ask your doctor.   Contact a doctor if:  You have a fever.  You have chills. Get help right away if:  You have pain or cramps that get worse.  You throw up blood.  You are sick to your stomach.  You cannot stop throwing up.  You cannot drink fluids.  You feel mixed up (confused).  You feel very thirsty (dehydrated).  Your belly gets more bloated.  You feel weak or you pass out (faint). Summary  A bowel obstruction means that something is blocking the small or large bowel.  Treatment may include IV fluids and pain medicine. You may also have a clear liquid diet, a small tube in your stomach, or surgery.  Drink clear liquids and avoid solid foods until you get better. This information is not intended to replace advice given to you by your health care provider. Make sure you discuss any questions you have with your health care provider. Document Revised: 05/04/2017 Document Reviewed: 05/04/2017 Elsevier Patient Education  2021 [employer].

## 2020-03-30 NOTE — TOC Transition Note (Signed)
Transition of Care [facility]) - CM/SW Discharge Note   Patient Details  Name: Brandi Mendez MRN: [id number] Date of Birth: [birth year]  [facility]) CM/SW Contact:  Kermit Balo, RN Phone Number: 03/30/2020, 3:22 PM   Clinical Narrative:    Patient discharging home with self care. No new medications ordered at d/c.  Pt has transport home.   Final next level of care: Home/Self Care Barriers to Discharge: Inadequate or no insurance,Barriers Unresolved (comment)   Patient Goals and CMS Choice        Discharge Placement                       Discharge Plan and Services   Discharge Planning Services: CM Consult                                 Social Determinants of Health (SDOH) Interventions     Readmission Risk Interventions No flowsheet data found.

## 2020-03-30 NOTE — Progress Notes (Signed)
Discharge instructions reviewed with patient.  These included, but were not limited to, the following:  Discharge medications (including those to avoid), recommended activity, how to avoid another SBO, when to call the MD, follow-up appointments, etc.  Comprehension of instructions was ascertained via use of "teach-back" during the communication of information.  Patient discharged to her private residence via private vehicle by daughter.  Escorted to exit via wheelchair with belongings and discharge paperwork.

## 2020-03-30 NOTE — TOC Initial Note (Signed)
Transition of Care [facility]) - Initial/Assessment Note    Patient Details  Name: Brandi Mendez MRN: [id number] Date of Birth: [birth year]  Transition of Care [facility]) CM/SW Contact:    Kermit Balo, RN Phone Number: 03/30/2020, 11:48 AM  Clinical Narrative:                 Patient is from home with daughter. She has no DME at home. She denies issues with transportation.  CM consulted for PCP. Pt goes to [facility] for PCP.  Pt did have an orange card that assisted with her medications. She states it has expired. CM has encouraged her to get this restarted.  Pt was using the [facility] Department for her meds but without the orange card she is more limited.  Pt will need medication assist at d/c.  TOC following.  Expected Discharge Plan: Home/Self Care Barriers to Discharge: Continued Medical Work up   Patient Goals and CMS Choice        Expected Discharge Plan and Services Expected Discharge Plan: Home/Self Care   Discharge Planning Services: CM Consult   Living arrangements for the past 2 months: Single Family Home                                      Prior Living Arrangements/Services Living arrangements for the past 2 months: Single Family Home Lives with:: Adult Children Patient language and need for interpreter reviewed:: Yes Do you feel safe going back to the place where you live?: Yes        Care giver support system in place?: Yes (comment)   Criminal Activity/Legal Involvement Pertinent to Current Situation/Hospitalization: No - Comment as needed  Activities of Daily Living Home Assistive Devices/Equipment: Eyeglasses ADL Screening (condition at time of admission) Patient's cognitive ability adequate to safely complete daily activities?: Yes Is the patient deaf or have difficulty hearing?: No Does the patient have difficulty seeing, even when wearing glasses/contacts?: No Does the patient have difficulty concentrating,  remembering, or making decisions?: No Patient able to express need for assistance with ADLs?: Yes Does the patient have difficulty dressing or bathing?: No Independently performs ADLs?: Yes (appropriate for developmental age) Does the patient have difficulty walking or climbing stairs?: No Weakness of Legs: None Weakness of Arms/Hands: None  Permission Sought/Granted                  Emotional Assessment Appearance:: Appears stated age Attitude/Demeanor/Rapport: Engaged Affect (typically observed): Accepting Orientation: : Oriented to Self,Oriented to Place,Oriented to  Time,Oriented to Situation   Psych Involvement: No (comment)  Admission diagnosis:  Abdominal pain [R10.9] Abdominal pain, unspecified abdominal location [R10.9] Patient Active Problem List   Diagnosis Date Noted  . Abdominal pain 03/29/2020  . History of small bowel obstruction   . Diarrhea   . Cough 06/10/2019  . Left shoulder pain 06/10/2019  . Otalgia of right ear 01/31/2019  . Vision changes 01/31/2019  . Chest pain of uncertain etiology 10/05/2012  . Hypotension 10/05/2012  . HYPERTRIGLYCERIDEMIA, MILD 12/26/2006  . OBESITY, UNSPECIFIED 12/26/2006   PCP:  Joana Reamer, DO Pharmacy:   [facility] [address] [place] [address] Phone: [phone] Fax: [phone]  [facility]. HEALTH DEPARTMENT - [place], [place] - [address] [place] [address] Phone: [phone] Fax: [phone]  Social Determinants of Health (SDOH) Interventions    Readmission Risk Interventions No flowsheet data found.

## 2020-03-31 LAB — OCCULT BLOOD, POC DEVICE: Fecal Occult Bld: NEGATIVE

## 2020-04-01 ENCOUNTER — Telehealth: Payer: Self-pay | Admitting: Obstetrics and Gynecology

## 2020-04-01 ENCOUNTER — Telehealth: Payer: Self-pay | Admitting: Lactation Services

## 2020-04-01 LAB — URINE CULTURE: Culture: 60000 — AB

## 2020-04-01 NOTE — Telephone Encounter (Signed)
-----   Message from Aletha Halim, MD sent at 04/01/2020 11:04 AM EDT ----- Please set her up with a gyn onc referral. Referral is in. thanks

## 2020-04-01 NOTE — Addendum Note (Signed)
Addended by: Aletha Halim on: 04/01/2020 11:04 AM   Modules accepted: Orders

## 2020-04-01 NOTE — Telephone Encounter (Signed)
Error

## 2020-04-01 NOTE — Telephone Encounter (Signed)
Kennard to make referral to Gyn/Onc, LM for [facility] Referral Coordinator.    Office informed Patient is not aware of findings of MRI or need for follow up with Gyn/Onc as MD has not been able to reach her.   Asked Referral Coordinator to call the office at [phone] with any further questions or concerns.   Will need follow up once patient has been notified.

## 2020-04-01 NOTE — Telephone Encounter (Signed)
GYN Telephone Note Patient called at [phone] and ASL interpreter stated call went to VM. VM left asking her to call GYN clinic to go over MRI results.  MRI results show worrisome endocervical mass, so patient will need to be referred to GYN Onc for further evaluation  I will try and call her back again later this week  Durene Romans MD Attending Center for [facility] (Faculty Practice) 04/01/2020 Time: 1040am

## 2020-04-02 ENCOUNTER — Ambulatory Visit: Payer: Self-pay

## 2020-04-02 ENCOUNTER — Telehealth: Payer: Self-pay | Admitting: *Deleted

## 2020-04-02 ENCOUNTER — Encounter: Payer: Self-pay | Admitting: General Practice

## 2020-04-02 NOTE — Telephone Encounter (Addendum)
Received a chat message from Domenica Reamer, NT at [facility] that they had called the patient to schedule the appointment and that they asked the patient to call our office for information.   They are planning to schedule patient on 4/7 with Dr. Berline Lopes.

## 2020-04-02 NOTE — Telephone Encounter (Addendum)
Called the patient and left a message to call the office back. The patient needs to talk to Dr Ilda Basset office first then be scheduled for a new patient appt on 4/7 with Dr Berline Lopes

## 2020-04-03 ENCOUNTER — Encounter: Payer: Self-pay | Admitting: Gynecologic Oncology

## 2020-04-03 ENCOUNTER — Telehealth: Payer: Self-pay | Admitting: Obstetrics and Gynecology

## 2020-04-03 NOTE — Telephone Encounter (Signed)
Per Dr. Ilda Basset he has spoken with patient. [facility] was informed that patient aware and can be scheduled.

## 2020-04-03 NOTE — Telephone Encounter (Signed)
Called the patient and scheduled a new patient appt for 4/4 at 9:45 am; patient given an arrival time of 9:15 am. Patient given the address and phone number for the clinic. Patient also given the policy for mask and visitors

## 2020-04-03 NOTE — Telephone Encounter (Signed)
GYN Telephone Note 04/01/2020  Patient called me back and I d/w her re: MRI and recommend evaluation by Gyn Onc which she is amenable to. Referral placed.  Durene Romans MD Attending Center for [employer] farm manager)

## 2020-04-03 NOTE — Patient Instructions (Incomplete)
It was a pleasure to see you today!  1.    Be Well,  Dr. Mahoney  

## 2020-04-03 NOTE — Progress Notes (Deleted)
    SUBJECTIVE:   CHIEF COMPLAINT / HPI:   Hospital f/u SBO: Patient reports abdominal pain is **. No problems at home, last BM **.   Hypokalemia: obtain BMP.  Anemia: Obtain CBC  PERTINENT  PMH / PSH: ***  OBJECTIVE:   There were no vitals taken for this visit.  ***  ASSESSMENT/PLAN:   No problem-specific Assessment & Plan notes found for this encounter.     Shirlean Mylar, MD [facility]   {    This will disappear when note is signed, click to select method of visit    :1}

## 2020-04-04 ENCOUNTER — Ambulatory Visit: Payer: Self-pay

## 2020-04-04 DIAGNOSIS — E876 Hypokalemia: Secondary | ICD-10-CM

## 2020-04-04 DIAGNOSIS — Z130 Encounter for screening for diseases of the blood and blood-forming organs and certain disorders involving the immune mechanism: Secondary | ICD-10-CM

## 2020-04-07 ENCOUNTER — Other Ambulatory Visit: Payer: Self-pay

## 2020-04-07 ENCOUNTER — Inpatient Hospital Stay: Payer: Medicaid Other | Attending: Gynecologic Oncology | Admitting: Gynecologic Oncology

## 2020-04-07 ENCOUNTER — Encounter: Payer: Self-pay | Admitting: Gynecologic Oncology

## 2020-04-07 VITALS — BP 145/86 | HR 102 | Temp 97.0°F | Resp 20 | Ht 67.0 in | Wt 190.6 lb

## 2020-04-07 DIAGNOSIS — H9190 Unspecified hearing loss, unspecified ear: Secondary | ICD-10-CM | POA: Insufficient documentation

## 2020-04-07 DIAGNOSIS — Z803 Family history of malignant neoplasm of breast: Secondary | ICD-10-CM | POA: Insufficient documentation

## 2020-04-07 DIAGNOSIS — D398 Neoplasm of uncertain behavior of other specified female genital organs: Secondary | ICD-10-CM | POA: Diagnosis not present

## 2020-04-07 DIAGNOSIS — Z7984 Long term (current) use of oral hypoglycemic drugs: Secondary | ICD-10-CM | POA: Insufficient documentation

## 2020-04-07 DIAGNOSIS — Z800 Family history of malignant neoplasm of digestive organs: Secondary | ICD-10-CM | POA: Insufficient documentation

## 2020-04-07 DIAGNOSIS — N888 Other specified noninflammatory disorders of cervix uteri: Secondary | ICD-10-CM

## 2020-04-07 DIAGNOSIS — R011 Cardiac murmur, unspecified: Secondary | ICD-10-CM | POA: Diagnosis not present

## 2020-04-07 DIAGNOSIS — E119 Type 2 diabetes mellitus without complications: Secondary | ICD-10-CM | POA: Diagnosis not present

## 2020-04-07 DIAGNOSIS — E7800 Pure hypercholesterolemia, unspecified: Secondary | ICD-10-CM | POA: Insufficient documentation

## 2020-04-07 DIAGNOSIS — Z79899 Other long term (current) drug therapy: Secondary | ICD-10-CM | POA: Diagnosis not present

## 2020-04-07 DIAGNOSIS — N950 Postmenopausal bleeding: Secondary | ICD-10-CM | POA: Insufficient documentation

## 2020-04-07 NOTE — Progress Notes (Signed)
Consult Note: Gyn-Onc  Consult was requested by Dr. Ilda Basset for the evaluation of Janice Massey 65 y.o. female She was seen with the assistance of a sign language interpreter present throughout.  CC:  Chief Complaint  Patient presents with  . Cervical mass    Assessment/Plan:  [name]  is a 65 y.o.  year old with hearing impairment and a space occupying lesion within the cervix.   We have performed biopsies today from the lesion.  If these confirm malignancy we will discuss radical hysterectomy with the patient given the clinical stage I B2 (less than 2 cm, visible lesion) of the disease site.  If benign I will refer her back to Dr. Ilda Basset for ongoing gynecologic care and consideration for excision of the benign lesion should bleeding continue to be an issue.  She is scheduled to return for inpatient counseling later this week after pathology is returned.   HPI: Ms Janice Massey is a 65 year old P2 who was seen in consultation at the request of Dr Ilda Basset for evaluation of an endocervical mass in the setting of postmenopausal bleeding.  The patient had episodes of vaginal spotting in November and December of 2022. She contacted Dr Ilda Basset office who scheduled the patient for a new gynecologic evaluation on ferry 25th 2022.  An attempt at endometrial Pipelle biopsy was unsuccessful at that time due to a perceived cervical stenosis.  Pap was taken with HPV subtyping.  The Pap revealed normal cytology with negative high-risk HPV.  A transvaginal ultrasound scan was performed on 03/07/2020 and revealed a uterus measuring 5.6 x 2.1 x 3.8 cm.  There was a heterogeneous area of masslike echogenicity at the cervix measuring 15 x 10 x 22 mm.  The endometrium itself was 2 mm in thickness.  The right ovary was grossly normal, the left ovary was not visualized.  To follow-up the findings of this ultrasound and MRI of the pelvis was then performed on 03/28/2020.  This revealed a unremarkable  uterus and no adnexal masses.  There was an 18 x 11 x 19 mm endocervical lesion which enhances following contrast administration.  It was worrisome for either endocervical polyp or possible early cervical cancer.  There was no lymphadenopathy or apparent extracervical disease.  The patient was referred to gynecologic oncology due to concern regarding discordance between imaging findings and physical exam by Dr. Ilda Basset.  Her medical history is most significant for diabetes mellitus for which she takes metformin, hypercholesterolemia and hyper tension.  She is hearing impaired and uses sign language to communicate.  Her surgical history is most significant for a tubal ligation.  Her gynecologic history is remarkable for 2 prior vaginal deliveries and a tubal ligation.  She reported having no history of abnormal Pap smears or cervical procedures.  She had received regular gynecologic care until approximately 2019 when scheduled visits stopped.  Her family cancer history is significant for a mother with liver cancer and a maternal cousin with breast cancer at age 46.  She is now retired. She lives by herself.   Current Meds:  Outpatient Encounter Medications as of 04/07/2020  Medication Sig  . aspirin 81 MG chewable tablet Chew 1 tablet (81 mg total) by mouth daily.  Marland Kitchen atorvastatin (LIPITOR) 40 MG tablet TAKE 1 TABLET(40 MG) BY MOUTH DAILY  . cetirizine (ZYRTEC) 10 MG tablet TAKE 1 TABLET(10 MG) BY MOUTH DAILY  . diltiazem (CARDIZEM CD) 120 MG 24 hr capsule TAKE 1 CAPSULE(120 MG) BY MOUTH DAILY  .  fluticasone (FLONASE) 50 MCG/ACT nasal spray SHAKE LIQUID AND USE 2 SPRAYS IN EACH NOSTRIL DAILY  . hydrochlorothiazide (HYDRODIURIL) 25 MG tablet TAKE 1 TABLET(25 MG) BY MOUTH DAILY  . INVOKANA 300 MG TABS tablet TAKE 1 TABLET(300 MG) BY MOUTH DAILY BEFORE BREAKFAST  . latanoprost (XALATAN) 0.005 % ophthalmic solution INSTILL 1 DROP IN BOTH EYES EVERY DAY IN THE EVENING  . losartan (COZAAR) 100 MG  tablet Take 1 tablet (100 mg total) by mouth daily.  . meloxicam (MOBIC) 15 MG tablet Take 15 mg by mouth daily.  . metFORMIN (GLUCOPHAGE) 1000 MG tablet TAKE 1 TABLET(1000 MG) BY MOUTH TWICE DAILY  . omeprazole (PRILOSEC) 40 MG capsule TAKE 1 CAPSULE(40 MG) BY MOUTH DAILY  . ondansetron (ZOFRAN) 4 MG tablet Take 1 tablet (4 mg total) by mouth every 6 (six) hours as needed for nausea or vomiting.  . sitaGLIPtin (JANUVIA) 100 MG tablet TAKE 1 TABLET(100 MG) BY MOUTH DAILY  . [DISCONTINUED] cyclobenzaprine (FLEXERIL) 10 MG tablet Take 1 tablet (10 mg total) by mouth 3 (three) times daily as needed for muscle spasms.   No facility-administered encounter medications on file as of 04/07/2020.    Allergy:  Allergies  Allergen Reactions  . Codeine Itching  . Lisinopril Cough  . Penicillins Itching    Has patient had a PCN reaction causing immediate rash, facial/tongue/throat swelling, SOB or lightheadedness with hypotension: YES Has patient had a PCN reaction causing severe rash involving mucus membranes or skin necrosis: NO Has patient had a PCN reaction that required hospitalization NO Has patient had a PCN reaction occurring within the last 10 years: NO If all of the above answers are "NO", then may proceed with Cephalosporin use.    Social Hx:   Social History   Socioeconomic History  . Marital status: Single    Spouse name: Not on file  . Number of children: 1  . Years of education: Not on file  . Highest education level: Not on file  Occupational History    Employer: UNEMPLOYED  Tobacco Use  . Smoking status: Never Smoker  . Smokeless tobacco: Never Used  Vaping Use  . Vaping Use: Never used  Substance and Sexual Activity  . Alcohol use: No    Alcohol/week: 0.0 standard drinks  . Drug use: No  . Sexual activity: Not Currently    Birth control/protection: Surgical  Other Topics Concern  . Not on file  Social History Narrative  . Not on file   Social Determinants of  Health   Financial Resource Strain: Not on file  Food Insecurity: Not on file  Transportation Needs: Not on file  Physical Activity: Not on file  Stress: Not on file  Social Connections: Not on file  Intimate Partner Violence: Not on file    Past Surgical Hx:  Past Surgical History:  Procedure Laterality Date  . CARDIAC CATHETERIZATION  08/23/95  . CHOLECYSTECTOMY  11/04/1993  . COLPOSCOPY    . ENDOMETRIAL BIOPSY  02/29/2020      . TUBAL LIGATION  10/05/1978    Past Medical Hx:  Past Medical History:  Diagnosis Date  . Atypical chest pain    myoview 10/29/10 - Post-stress EF 56%. Low risk and negative for ischemia   . Back pain   . 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 Chronic PID (chronic pelvic inflammatory disease)   . Deaf    Since age 68  . Diabetes mellitus   .  Dizziness   . Dyslipidemia   . Dyspnea   . History of Doppler ultrasound 06/20/09   Normal, no prior studies for comparison.   . Hypertension   . Lightheadedness   . Mute   . Obese   . URI (upper respiratory infection)   . Ventricular septal defect 2004 per echo   small membranous VSD    Past Gynecological History:  SVD x 2, tubal ligation. No history of abnormal paps. No LMP recorded. Patient is postmenopausal.  Family Hx:  Family History  Problem Relation Age of Onset  . Cancer Mother        liver  . Heart attack Mother 52  . Hypertension Father   . Diabetes Father   . Heart attack Father   . CAD Father 92  . Diabetes Paternal Aunt   . Diabetes Maternal Grandfather   . Breast cancer Cousin   . Colon cancer Neg Hx   . Endometrial cancer Neg Hx   . Pancreatic cancer Neg Hx   . Prostate cancer Neg Hx   . Ovarian cancer Neg Hx     Review of Systems:  Constitutional  Feels well,   ENT Normal appearing ears and nares bilaterally Skin/Breast  No rash, sores, jaundice, itching, dryness Cardiovascular  No chest pain, shortness of breath,  or edema  Pulmonary  No cough or wheeze.  Gastro Intestinal  No nausea, vomitting, or diarrhoea. No bright red blood per rectum, no abdominal pain, change in bowel movement, or constipation.  Genito Urinary  No frequency, urgency, dysuria, + postmenopausal spotting in 2021. Musculo Skeletal  No myalgia, arthralgia, joint swelling or pain  Neurologic  No weakness, numbness, change in gait,  Psychology  No depression, anxiety, insomnia.   Vitals:  Blood pressure (!) 145/86, pulse (!) 102, temperature (!) 97 F (36.1 C), temperature source Tympanic, resp. rate 20, height 5\' 7"  (1.702 m), weight 190 lb 9.6 oz (86.5 kg), SpO2 100 %.  Physical Exam: WD in NAD Neck  Supple NROM, without any enlargements.  Lymph Node Survey No cervical supraclavicular or inguinal adenopathy Cardiovascular  Well perfused peripheries.  Lungs  No increased WOB.  Skin  No rash/lesions/breakdown  Psychiatry  Alert and oriented to person, place, and time  Abdomen  Normoactive bowel sounds, abdomen soft, non-tender and obese without evidence of hernia. Back No CVA tenderness Genito Urinary  Vulva/vagina: Normal external female genitalia.  No lesions. No discharge or bleeding.  Bladder/urethra:  No lesions or masses, well supported bladder  Vagina: grossly normal  Cervix: The cervix is small.  It is somewhat flush with the vagina.  The ectocervix itself is grossly normal.  Appearing from the anterior surface of the endocervical os is an erythematous area of slight nodularity.  This was sampled (see note below).  The cervix is palpably normal with no parametrial extension or masses  Uterus:  Small, mobile, no parametrial involvement or nodularity.  Adnexa: No palpable masses. Rectal  Good tone, no masses no cul de sac nodularity.  Extremities  No bilateral cyanosis, clubbing or edema.  Procedure Note:  Preop Dx: cervical mass, postmenopausal bleeding Postop Dx: same Procedure: endocervical biopsy and  curettage Surgeon: Dorann Ou, MD EBL: scant Specimens: endocervix Complications: none Procedure Details: The patient provided verbal consent and a verbal timeout was performed.  The speculum was inserted to the vagina and the cervix was visualized.  It was grasped with a single-tooth tenaculum.  The os dilator was used to dilate the cervical os.  A Kevorkian curette was used to sample the abnormal nodular area of the anterior endocervix.  A Tischler biopsy forcep was then used to also sample this area.  The tissue was placed in the same specimen cup as it represented the same lesion.  Hemostasis was achieved with silver nitrate sticks at the tenaculum sites as well as at the endocervical lesion.  There was minimal blood loss.  The specimen was sent for histopathology.  The patient tolerated the procedure well.   60 minutes of total time was spent for this patient encounter, including preparation, face-to-face counseling with the patient and coordination of care, review of imaging (results and images), communication with the referring provider and documentation of the encounter.   Thereasa Solo, MD  04/07/2020, 10:22 AM

## 2020-04-07 NOTE — Patient Instructions (Signed)
Dr Denman George will meet with you on the afternoon on April 7th to discuss the biopsy results.  If the pathology is not back by then, she will reschedule you for the following week.  Today she took a biopsy from the area inside of the cervix. It is normal for it to bleed for 1-2 weeks.   Dr Serita Grit office can be reached at [phone].

## 2020-04-08 ENCOUNTER — Encounter: Payer: Self-pay | Admitting: Gynecologic Oncology

## 2020-04-08 LAB — SURGICAL PATHOLOGY

## 2020-04-10 ENCOUNTER — Other Ambulatory Visit: Payer: Self-pay

## 2020-04-10 ENCOUNTER — Inpatient Hospital Stay (HOSPITAL_BASED_OUTPATIENT_CLINIC_OR_DEPARTMENT_OTHER): Payer: Medicaid Other | Admitting: Gynecologic Oncology

## 2020-04-10 ENCOUNTER — Encounter: Payer: Self-pay | Admitting: Gynecologic Oncology

## 2020-04-10 VITALS — BP 107/68 | HR 96 | Temp 97.0°F | Resp 16 | Ht 67.0 in | Wt 186.8 lb

## 2020-04-10 DIAGNOSIS — D398 Neoplasm of uncertain behavior of other specified female genital organs: Secondary | ICD-10-CM

## 2020-04-10 DIAGNOSIS — N888 Other specified noninflammatory disorders of cervix uteri: Secondary | ICD-10-CM

## 2020-04-10 NOTE — Patient Instructions (Signed)
The biopsies that Dr Denman George took of the cervix showed benign tissue. Therefore no procedures are necessary right now.  Dr Denman George can be reached at [phone].  Dr Denman George recommends that you see Dr Ilda Basset in 6 months and her office is setting you up with this appointment.  If you develop new onset vaginal bleeding before (or after) that time, please call Dr Ilda Basset office for an examination, as Dr Denman George recommends that the area be biopsied again or surgically removed if you develop new bleeding.

## 2020-04-10 NOTE — Progress Notes (Signed)
Follow-up Note: Gyn-Onc  Consult was initially requested by Dr. Ilda Basset for the evaluation of Janice Massey 65 y.o. female She was seen with the assistance of a sign language interpreter present throughout.  CC:  Chief Complaint  Patient presents with  . Cervical mass    Assessment/Plan:  Janice Massey  is a 65 y.o.  year old with hearing impairment and a mass seen within the cervix on imaging. This was associated with postmenopausal spotting.   The biopsies from the portion of the mass that was seen on examination (at the external os) revealed benign endocervical glandular and squamous epithelium.  Therefore I have no evidence of malignancy at present.  There is no ongoing bleeding.  Her HPV was negative and Pap normal all of these are reassuring.  Therefore I am not recommending proceeding with intervention at this time.  However I explained to the patient in writing and with a sign language interpreter that she should see Dr. Ilda Basset in the future if she develops new episodes of bleeding.  If this were to occur I recommend that he perform an excisional procedure such as a LEEP or cone to excise the area of both for therapeutic purposes (to stop bleeding) and improve diagnostic purposes.  I recommend that she be evaluated by Dr. Ilda Basset in 6 months for a pelvic examination to inspect the area ensure there is no occult growth of a more concerning lesion.  HPI: Ms Janice Massey is a 65 year old P2 who was seen in consultation at the request of Dr Ilda Basset for evaluation of an endocervical mass in the setting of postmenopausal bleeding.  The patient had episodes of vaginal spotting in November and December of 2022. She contacted Dr Ilda Basset office who scheduled the patient for a new gynecologic evaluation on ferry 25th 2022.  An attempt at endometrial Pipelle biopsy was unsuccessful at that time due to a perceived cervical stenosis.  Pap was taken with HPV subtyping.  The Pap revealed  normal cytology with negative high-risk HPV.  A transvaginal ultrasound scan was performed on 03/07/2020 and revealed a uterus measuring 5.6 x 2.1 x 3.8 cm.  There was a heterogeneous area of masslike echogenicity at the cervix measuring 15 x 10 x 22 mm.  The endometrium itself was 2 mm in thickness.  The right ovary was grossly normal, the left ovary was not visualized.  To follow-up the findings of this ultrasound and MRI of the pelvis was then performed on 03/28/2020.  This revealed a unremarkable uterus and no adnexal masses.  There was an 18 x 11 x 19 mm endocervical lesion which enhances following contrast administration.  It was worrisome for either endocervical polyp or possible early cervical cancer.  There was no lymphadenopathy or apparent extracervical disease.  The patient was referred to gynecologic oncology due to concern regarding discordance between imaging findings and physical exam by Dr. Ilda Basset.  Her medical history is most significant for diabetes mellitus for which she takes metformin, hypercholesterolemia and hyper tension.  She is hearing impaired and uses sign language to communicate.  Her surgical history is most significant for a tubal ligation.  Her gynecologic history is remarkable for 2 prior vaginal deliveries and a tubal ligation.  She reported having no history of abnormal Pap smears or cervical procedures.  She had received regular gynecologic care until approximately 2019 when scheduled visits stopped.  Her family cancer history is significant for a mother with liver cancer and a maternal cousin with breast  cancer at age 17.  She is now retired. She lives by herself.   Interval Hx: She was seen by me on April 07, 2020 for evaluation of the cervical mass.  Pelvic examination revealed a 1 to 2 mm area of erythema on the anterior external os extending up into the cervix.  Does not appear grossly malignant.  Would appear to correspond to the lesion seen on imaging.   This area was copiously sampled with an endocervical curette as well as a Kevorkian biopsy forcep.  Pathology from this biopsy revealed benign endocervical glandular and squamous epithelium and no malignancy or dysplasia.  Current Meds:  Outpatient Encounter Medications as of 04/10/2020  Medication Sig  . aspirin 81 MG chewable tablet Chew 1 tablet (81 mg total) by mouth daily.  Marland Kitchen atorvastatin (LIPITOR) 40 MG tablet TAKE 1 TABLET(40 MG) BY MOUTH DAILY  . cetirizine (ZYRTEC) 10 MG tablet TAKE 1 TABLET(10 MG) BY MOUTH DAILY  . diltiazem (CARDIZEM CD) 120 MG 24 hr capsule TAKE 1 CAPSULE(120 MG) BY MOUTH DAILY  . fluticasone (FLONASE) 50 MCG/ACT nasal spray SHAKE LIQUID AND USE 2 SPRAYS IN EACH NOSTRIL DAILY  . hydrochlorothiazide (HYDRODIURIL) 25 MG tablet TAKE 1 TABLET(25 MG) BY MOUTH DAILY  . INVOKANA 300 MG TABS tablet TAKE 1 TABLET(300 MG) BY MOUTH DAILY BEFORE BREAKFAST  . latanoprost (XALATAN) 0.005 % ophthalmic solution INSTILL 1 DROP IN BOTH EYES EVERY DAY IN THE EVENING  . losartan (COZAAR) 100 MG tablet Take 1 tablet (100 mg total) by mouth daily.  . meloxicam (MOBIC) 15 MG tablet Take 15 mg by mouth daily.  . metFORMIN (GLUCOPHAGE) 1000 MG tablet TAKE 1 TABLET(1000 MG) BY MOUTH TWICE DAILY  . omeprazole (PRILOSEC) 40 MG capsule TAKE 1 CAPSULE(40 MG) BY MOUTH DAILY  . sitaGLIPtin (JANUVIA) 100 MG tablet TAKE 1 TABLET(100 MG) BY MOUTH DAILY  . ondansetron (ZOFRAN) 4 MG tablet Take 1 tablet (4 mg total) by mouth every 6 (six) hours as needed for nausea or vomiting. (Patient not taking: Reported on 04/08/2020)   No facility-administered encounter medications on file as of 04/10/2020.    Allergy:  Allergies  Allergen Reactions  . Codeine Itching  . Lisinopril Cough  . Penicillins Itching    Has patient had a PCN reaction causing immediate rash, facial/tongue/throat swelling, SOB or lightheadedness with hypotension: YES Has patient had a PCN reaction causing severe rash involving mucus  membranes or skin necrosis: NO Has patient had a PCN reaction that required hospitalization NO Has patient had a PCN reaction occurring within the last 10 years: NO If all of the above answers are "NO", then may proceed with Cephalosporin use.    Social Hx:   Social History   Socioeconomic History  . Marital status: Single    Spouse name: Not on file  . Number of children: 1  . Years of education: Not on file  . Highest education level: Not on file  Occupational History    Employer: UNEMPLOYED  Tobacco Use  . Smoking status: Never Smoker  . Smokeless tobacco: Never Used  Vaping Use  . Vaping Use: Never used  Substance and Sexual Activity  . Alcohol use: No    Alcohol/week: 0.0 standard drinks  . Drug use: No  . Sexual activity: Not Currently    Birth control/protection: Surgical  Other Topics Concern  . Not on file  Social History Narrative  . Not on file   Social Determinants of Health   Financial Resource Strain: Not  on file  Food Insecurity: Not on file  Transportation Needs: Not on file  Physical Activity: Not on file  Stress: Not on file  Social Connections: Not on file  Intimate Partner Violence: Not on file    Past Surgical Hx:  Past Surgical History:  Procedure Laterality Date  . CARDIAC CATHETERIZATION  08/23/95  . CHOLECYSTECTOMY  11/04/1993  . COLPOSCOPY    . ENDOMETRIAL BIOPSY  02/29/2020      . TUBAL LIGATION  10/05/1978    Past Medical Hx:  Past Medical History:  Diagnosis Date  . Atypical chest pain    myoview 10/29/10 - Post-stress EF 56%. Low risk and negative for ischemia   . Back pain   . 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 Chronic PID (chronic pelvic inflammatory disease)   . Deaf    Since age 25  . Diabetes mellitus   . Dizziness   . Dyslipidemia   . Dyspnea   . History of Doppler ultrasound 06/20/09   Normal, no prior studies for comparison.   . Hypertension   .  Lightheadedness   . Mute   . Obese   . URI (upper respiratory infection)   . Ventricular septal defect 2004 per echo   small membranous VSD    Past Gynecological History:  SVD x 2, tubal ligation. No history of abnormal paps. No LMP recorded. Patient is postmenopausal.  Family Hx:  Family History  Problem Relation Age of Onset  . Cancer Mother        liver  . Heart attack Mother 22  . Hypertension Father   . Diabetes Father   . Heart attack Father   . CAD Father 67  . Diabetes Paternal Aunt   . Diabetes Maternal Grandfather   . Breast cancer Cousin   . Colon cancer Neg Hx   . Endometrial cancer Neg Hx   . Pancreatic cancer Neg Hx   . Prostate cancer Neg Hx   . Ovarian cancer Neg Hx     Review of Systems:  Constitutional  Feels well,   ENT Normal appearing ears and nares bilaterally Skin/Breast  No rash, sores, jaundice, itching, dryness Cardiovascular  No chest pain, shortness of breath, or edema  Pulmonary  No cough or wheeze.  Gastro Intestinal  No nausea, vomitting, or diarrhoea. No bright red blood per rectum, no abdominal pain, change in bowel movement, or constipation.  Genito Urinary  No frequency, urgency, dysuria, + postmenopausal spotting in 2021. Musculo Skeletal  No myalgia, arthralgia, joint swelling or pain  Neurologic  No weakness, numbness, change in gait,  Psychology  No depression, anxiety, insomnia.   Vitals:  Blood pressure 107/68, pulse 96, temperature (!) 97 F (36.1 C), temperature source Tympanic, resp. rate 16, height 5\' 7"  (1.702 m), weight 186 lb 12.8 oz (84.7 kg), SpO2 100 %.  Physical Exam: deferred   Thereasa Solo, MD  04/10/2020, 1:17 PM

## 2020-05-01 ENCOUNTER — Other Ambulatory Visit: Payer: Self-pay | Admitting: Internal Medicine

## 2020-05-06 ENCOUNTER — Telehealth: Payer: Self-pay | Admitting: Dietician

## 2020-05-06 NOTE — Telephone Encounter (Signed)
They are faxing the notes from her 2021 eye exam..

## 2020-05-07 DIAGNOSIS — S46011A Strain of muscle(s) and tendon(s) of the rotator cuff of right shoulder, initial encounter: Secondary | ICD-10-CM | POA: Diagnosis not present

## 2020-05-07 DIAGNOSIS — S46012A Strain of muscle(s) and tendon(s) of the rotator cuff of left shoulder, initial encounter: Secondary | ICD-10-CM | POA: Diagnosis not present

## 2020-05-07 DIAGNOSIS — M542 Cervicalgia: Secondary | ICD-10-CM | POA: Diagnosis not present

## 2020-05-09 ENCOUNTER — Other Ambulatory Visit: Payer: Self-pay | Admitting: Internal Medicine

## 2020-05-09 LAB — HM DIABETES EYE EXAM

## 2020-05-20 ENCOUNTER — Encounter (HOSPITAL_COMMUNITY): Payer: Self-pay | Admitting: Cardiovascular Disease

## 2020-05-27 ENCOUNTER — Ambulatory Visit (HOSPITAL_COMMUNITY): Payer: Medicaid Other

## 2020-06-03 ENCOUNTER — Other Ambulatory Visit: Payer: Self-pay | Admitting: *Deleted

## 2020-06-03 MED ORDER — CETIRIZINE HCL 10 MG PO TABS: 10.0000 mg | ORAL_TABLET | Freq: Every day | ORAL | 1 refills | Status: DC | PRN

## 2020-06-11 ENCOUNTER — Other Ambulatory Visit (HOSPITAL_COMMUNITY): Payer: Medicaid Other

## 2020-06-13 ENCOUNTER — Encounter: Payer: Self-pay | Admitting: *Deleted

## 2020-06-13 ENCOUNTER — Other Ambulatory Visit: Payer: Self-pay | Admitting: *Deleted

## 2020-06-13 MED ORDER — DILTIAZEM HCL ER COATED BEADS 120 MG PO CP24: ORAL_CAPSULE | ORAL | 0 refills | Status: DC

## 2020-06-13 NOTE — Telephone Encounter (Signed)
Please arrange a blood pressure follow up visit with patient's new PCP

## 2020-06-17 ENCOUNTER — Other Ambulatory Visit: Payer: Self-pay

## 2020-06-17 DIAGNOSIS — E1129 Type 2 diabetes mellitus with other diabetic kidney complication: Secondary | ICD-10-CM

## 2020-06-17 MED ORDER — CANAGLIFLOZIN 300 MG PO TABS: 300.0000 mg | ORAL_TABLET | Freq: Every day | ORAL | 2 refills | Status: DC

## 2020-06-26 ENCOUNTER — Telehealth: Payer: Self-pay | Admitting: Dietician

## 2020-06-26 ENCOUNTER — Encounter: Payer: Self-pay | Admitting: Dietician

## 2020-06-26 NOTE — Telephone Encounter (Signed)
Ivin Booty from [facility] called back and said Dr. Manuella Ghazi told her that Janice Massey does not have diabetic retinopathy

## 2020-06-26 NOTE — Telephone Encounter (Signed)
I spoke with Janice Massey in medical records at [facility] about the dilated exam that they faxed to our office. It notes her diabetes in her problems and includes a fundal exam, but does not note presence or absence of diabetic retinopathy. Janice Massey notes that in her 2017 exam he did note - no diabetic retinopathy; sharon will ask Dr. Manuella Ghazi to clarify and call us back today if she does have retinopathy and will not call us if she does not.

## 2020-07-02 ENCOUNTER — Other Ambulatory Visit: Payer: Self-pay

## 2020-07-02 ENCOUNTER — Ambulatory Visit (HOSPITAL_COMMUNITY): Payer: Medicaid Other | Attending: Internal Medicine

## 2020-07-02 DIAGNOSIS — Q210 Ventricular septal defect: Secondary | ICD-10-CM | POA: Diagnosis not present

## 2020-07-02 LAB — ECHOCARDIOGRAM COMPLETE
Area-P 1/2: 4.26 cm2
S' Lateral: 2.8 cm

## 2020-07-17 ENCOUNTER — Encounter: Payer: Medicaid Other | Admitting: Internal Medicine

## 2020-07-17 ENCOUNTER — Ambulatory Visit (INDEPENDENT_AMBULATORY_CARE_PROVIDER_SITE_OTHER): Payer: Medicaid Other | Admitting: Internal Medicine

## 2020-07-17 ENCOUNTER — Encounter: Payer: Self-pay | Admitting: Internal Medicine

## 2020-07-17 ENCOUNTER — Other Ambulatory Visit: Payer: Self-pay

## 2020-07-17 VITALS — BP 128/77 | HR 90 | Temp 98.2°F | Ht 67.0 in | Wt 184.6 lb

## 2020-07-17 DIAGNOSIS — Z0000 Encounter for general adult medical examination without abnormal findings: Secondary | ICD-10-CM

## 2020-07-17 DIAGNOSIS — K219 Gastro-esophageal reflux disease without esophagitis: Secondary | ICD-10-CM

## 2020-07-17 DIAGNOSIS — R809 Proteinuria, unspecified: Secondary | ICD-10-CM | POA: Diagnosis not present

## 2020-07-17 DIAGNOSIS — E785 Hyperlipidemia, unspecified: Secondary | ICD-10-CM

## 2020-07-17 DIAGNOSIS — I10 Essential (primary) hypertension: Secondary | ICD-10-CM | POA: Diagnosis not present

## 2020-07-17 DIAGNOSIS — J302 Other seasonal allergic rhinitis: Secondary | ICD-10-CM | POA: Diagnosis not present

## 2020-07-17 DIAGNOSIS — N183 Chronic kidney disease, stage 3 unspecified: Secondary | ICD-10-CM | POA: Diagnosis not present

## 2020-07-17 DIAGNOSIS — E1129 Type 2 diabetes mellitus with other diabetic kidney complication: Secondary | ICD-10-CM | POA: Diagnosis not present

## 2020-07-17 LAB — GLUCOSE, CAPILLARY: Glucose-Capillary: 190 mg/dL — ABNORMAL HIGH (ref 70–99)

## 2020-07-17 LAB — POCT GLYCOSYLATED HEMOGLOBIN (HGB A1C): Hemoglobin A1C: 7.5 % — AB (ref 4.0–5.6)

## 2020-07-17 MED ORDER — CETIRIZINE HCL 10 MG PO TABS: 10.0000 mg | ORAL_TABLET | Freq: Every day | ORAL | 1 refills | Status: DC | PRN

## 2020-07-17 MED ORDER — LOSARTAN POTASSIUM 100 MG PO TABS
100.0000 mg | ORAL_TABLET | Freq: Every day | ORAL | 2 refills | Status: DC
Start: 2020-07-17 — End: 2020-11-18

## 2020-07-17 MED ORDER — LATANOPROST 0.005 % OP SOLN: OPHTHALMIC | 1 refills | Status: DC

## 2020-07-17 MED ORDER — CANAGLIFLOZIN 300 MG PO TABS: 300.0000 mg | ORAL_TABLET | Freq: Every day | ORAL | 2 refills | Status: DC

## 2020-07-17 MED ORDER — MELOXICAM 15 MG PO TABS: 15.0000 mg | ORAL_TABLET | Freq: Every day | ORAL | 1 refills | Status: DC

## 2020-07-17 MED ORDER — METFORMIN HCL 1000 MG PO TABS: ORAL_TABLET | ORAL | 1 refills | Status: DC

## 2020-07-17 MED ORDER — OMEPRAZOLE 40 MG PO CPDR: 40.0000 mg | DELAYED_RELEASE_CAPSULE | Freq: Every day | ORAL | 0 refills | Status: DC

## 2020-07-17 MED ORDER — ASPIRIN 81 MG PO CHEW: 81.0000 mg | CHEWABLE_TABLET | Freq: Every day | ORAL | 3 refills | Status: DC

## 2020-07-17 MED ORDER — FLUTICASONE PROPIONATE 50 MCG/ACT NA SUSP: NASAL | 2 refills | Status: DC

## 2020-07-17 MED ORDER — DILTIAZEM HCL ER COATED BEADS 120 MG PO CP24: ORAL_CAPSULE | ORAL | 0 refills | Status: DC

## 2020-07-17 MED ORDER — ATORVASTATIN CALCIUM 40 MG PO TABS: 40.0000 mg | ORAL_TABLET | Freq: Every day | ORAL | 0 refills | Status: DC

## 2020-07-17 MED ORDER — HYDROCHLOROTHIAZIDE 25 MG PO TABS: ORAL_TABLET | ORAL | 5 refills | Status: DC

## 2020-07-17 MED ORDER — SITAGLIPTIN PHOSPHATE 100 MG PO TABS: ORAL_TABLET | ORAL | 5 refills | Status: DC

## 2020-07-17 NOTE — Patient Instructions (Signed)
I have refilled all your prescriptions that needed refills to your [facility] pharmacy.  2.  Continue to exercise and diet as we discussed.   3.  Continue taking your medications as directed.   4.  Follow-up in 3 months, October.

## 2020-07-17 NOTE — Assessment & Plan Note (Signed)
Ms. Bloomfield has a history of HLD.  She is currently taking atorvastatin 40 mg daily.  PLAN: Repeat lipid profile.

## 2020-07-17 NOTE — Assessment & Plan Note (Addendum)
Janice Massey hemoglobin A1c today was 7.5%.  Improved from 8 months ago which was 8.9%.  PLAN: Continue current medication regimen for her diabetes. Microalbumin/creatinine ratio ordered Will follow-up with results.

## 2020-07-17 NOTE — Assessment & Plan Note (Signed)
Janice Massey requested medication refill.  Janice Massey is interested in getting the shingles vaccine.  Janice Massey plans to follow-up for upcoming visits for Pap smear.   PLAN: All medications refilled. Follow-up in October.

## 2020-07-17 NOTE — Progress Notes (Signed)
CC: Establish care and check hemoglobin A1c  HPI:  Janice Massey is a 65 y.o. female with a past medical history stated below and presents today for establishing care and check hemoglobin A1c.  Janice Massey current A1c is 7.5%, improved from 8 months ago which was 8.9%.  Janice Massey denies any fever, chills, chest pain, SOB, abdominal pain, N/V, diarrhea or constipation.  She denies leg cramping or pain in any of her extremities.  She has been improving in her diet, she reports that she does not eat fast food, increasing her vegetable and fruit intake.  She lives at home with her daughter and her daughter helps cook her meals.  Janice Massey reports working out 4-5 times a week she walks around her neighborhood for about 30 minutes.      Please see problem based assessment and plan for additional details.  Past Medical History:  Diagnosis Date   Atypical chest pain    myoview 10/29/10 - Post-stress EF 56%. Low risk and negative for ischemia    Back pain    Cardiac murmur    small membranous VSD with fairly loud cardiac murmur (asymptomatic)    Chronic PID (chronic pelvic inflammatory disease)    Deaf    Since age 89   Diabetes mellitus    Dizziness    Dyslipidemia    Dyspnea    History of Doppler ultrasound 06/20/09   Normal, no prior studies for comparison.    Hypertension    Lightheadedness    Mute    Obese    URI (upper respiratory infection)    Ventricular septal defect 2004 per echo   small membranous VSD    Current Outpatient Medications on File Prior to Visit  Medication Sig Dispense Refill   aspirin 81 MG chewable tablet Chew 1 tablet (81 mg total) by mouth daily. 30 tablet 3   atorvastatin (LIPITOR) 40 MG tablet TAKE 1 TABLET(40 MG) BY MOUTH DAILY 90 tablet 1   canagliflozin (INVOKANA) 300 MG TABS tablet Take 1 tablet (300 mg total) by mouth daily before breakfast. 30 tablet 2   cetirizine (ZYRTEC) 10 MG tablet Take 1 tablet (10 mg total) by mouth daily as needed for  allergies. 90 tablet 1   diltiazem (CARDIZEM CD) 120 MG 24 hr capsule Take once daily. 90 capsule 0   fluticasone (FLONASE) 50 MCG/ACT nasal spray SHAKE LIQUID AND USE 2 SPRAYS IN EACH NOSTRIL DAILY 16 g 2   hydrochlorothiazide (HYDRODIURIL) 25 MG tablet TAKE 1 TABLET(25 MG) BY MOUTH DAILY 30 tablet 5   latanoprost (XALATAN) 0.005 % ophthalmic solution INSTILL 1 DROP IN BOTH EYES EVERY DAY IN THE EVENING     losartan (COZAAR) 100 MG tablet Take 1 tablet (100 mg total) by mouth daily. 90 tablet 2   meloxicam (MOBIC) 15 MG tablet Take 15 mg by mouth daily.     metFORMIN (GLUCOPHAGE) 1000 MG tablet TAKE 1 TABLET(1000 MG) BY MOUTH TWICE DAILY 180 tablet 1   omeprazole (PRILOSEC) 40 MG capsule TAKE 1 CAPSULE(40 MG) BY MOUTH DAILY 30 capsule 5   ondansetron (ZOFRAN) 4 MG tablet Take 1 tablet (4 mg total) by mouth every 6 (six) hours as needed for nausea or vomiting. (Patient not taking: Reported on 04/08/2020) 12 tablet 0   sitaGLIPtin (JANUVIA) 100 MG tablet TAKE 1 TABLET(100 MG) BY MOUTH DAILY 30 tablet 5   No current facility-administered medications on file prior to visit.    Family History  Problem Relation Age  of Onset   Cancer Mother        liver   Heart attack Mother 61   Hypertension Father    Diabetes Father    Heart attack Father    CAD Father 89   Diabetes Paternal Aunt    Diabetes Maternal Grandfather    Breast cancer Cousin    Colon cancer Neg Hx    Endometrial cancer Neg Hx    Pancreatic cancer Neg Hx    Prostate cancer Neg Hx    Ovarian cancer Neg Hx     Social History   Socioeconomic History   Marital status: Single    Spouse name: Not on file   Number of children: 1   Years of education: Not on file   Highest education level: Not on file  Occupational History    Employer: UNEMPLOYED  Tobacco Use   Smoking status: Never   Smokeless tobacco: Never  Vaping Use   Vaping Use: Never used  Substance and Sexual Activity   Alcohol use: No    Alcohol/week: 0.0  standard drinks   Drug use: No   Sexual activity: Not Currently    Birth control/protection: Surgical  Other Topics Concern   Not on file  Social History Narrative   Not on file   Social Determinants of Health   Financial Resource Strain: Not on file  Food Insecurity: Not on file  Transportation Needs: Not on file  Physical Activity: Not on file  Stress: Not on file  Social Connections: Not on file  Intimate Partner Violence: Not on file    Review of Systems  Constitutional:  Negative for chills and fever.  Eyes:  Negative for blurred vision.  Respiratory:  Negative for shortness of breath and wheezing.   Cardiovascular:  Negative for chest pain, palpitations, claudication and leg swelling.  Gastrointestinal:  Negative for abdominal pain, constipation, diarrhea, nausea and vomiting.  Musculoskeletal:  Negative for myalgias.  Neurological:  Negative for dizziness, weakness and headaches.    Vitals:   07/17/20 1428  BP: 128/77  Pulse: 90  Temp: 98.2 F (36.8 C)  TempSrc: Oral  SpO2: 99%  Weight: 184 lb 9.6 oz (83.7 kg)  Height: 5\' 7"  (1.702 m)     Physical Exam Constitutional:      Appearance: Normal appearance.  HENT:     Head: Normocephalic and atraumatic.  Cardiovascular:     Rate and Rhythm: Normal rate and regular rhythm.     Heart sounds: Normal heart sounds, S1 normal and S2 normal. No murmur heard.   No gallop.  Pulmonary:     Effort: Pulmonary effort is normal.     Breath sounds: Normal breath sounds and air entry.  Musculoskeletal:     Right lower leg: No edema.     Left lower leg: No edema.  Neurological:     Mental Status: She is alert.  Psychiatric:        Behavior: Behavior is cooperative.      Assessment & Plan:   See Encounters Tab for problem based charting.  Patient seen with Dr. Rachael Fee, M.D. [facility], PGY-1 Pager: [phone], Phone: [phone] Date 07/17/2020 Time 5:16 PM

## 2020-07-18 LAB — LIPID PANEL
Chol/HDL Ratio: 2.6 ratio (ref 0.0–4.4)
Cholesterol, Total: 169 mg/dL (ref 100–199)
HDL: 64 mg/dL (ref >39)
LDL Chol Calc (NIH): 86 mg/dL (ref 0–99)
Triglycerides: 105 mg/dL (ref 0–149)
VLDL Cholesterol Cal: 19 mg/dL (ref 5–40)

## 2020-07-18 LAB — MICROALBUMIN / CREATININE URINE RATIO
Creatinine, Urine: 34.5 mg/dL
Microalb/Creat Ratio: 28 mg/g creat (ref 0–29)
Microalbumin, Urine: 9.6 ug/mL

## 2020-08-06 ENCOUNTER — Ambulatory Visit: Payer: Medicaid Other | Admitting: Physician Assistant

## 2020-08-06 ENCOUNTER — Other Ambulatory Visit: Payer: Self-pay

## 2020-08-06 ENCOUNTER — Encounter: Payer: Self-pay | Admitting: Physician Assistant

## 2020-08-06 VITALS — BP 110/76 | HR 107 | Ht 67.0 in | Wt 186.0 lb

## 2020-08-06 DIAGNOSIS — E782 Mixed hyperlipidemia: Secondary | ICD-10-CM | POA: Diagnosis not present

## 2020-08-06 DIAGNOSIS — Q210 Ventricular septal defect: Secondary | ICD-10-CM

## 2020-08-06 DIAGNOSIS — R252 Cramp and spasm: Secondary | ICD-10-CM

## 2020-08-06 DIAGNOSIS — I10 Essential (primary) hypertension: Secondary | ICD-10-CM | POA: Diagnosis not present

## 2020-08-06 LAB — BASIC METABOLIC PANEL
BUN/Creatinine Ratio: 15 (ref 12–28)
BUN: 14 mg/dL (ref 8–27)
CO2: 25 mmol/L (ref 20–29)
Calcium: 10 mg/dL (ref 8.7–10.3)
Chloride: 98 mmol/L (ref 96–106)
Creatinine, Ser: 0.94 mg/dL (ref 0.57–1.00)
Glucose: 197 mg/dL — ABNORMAL HIGH (ref 65–99)
Potassium: 3.7 mmol/L (ref 3.5–5.2)
Sodium: 140 mmol/L (ref 134–144)
eGFR: 68 mL/min/{1.73_m2} (ref >59)

## 2020-08-06 LAB — MAGNESIUM: Magnesium: 1.8 mg/dL (ref 1.6–2.3)

## 2020-08-06 NOTE — Progress Notes (Signed)
Cardiology Office Note:    Date:  08/06/2020   ID:  Janice Massey, DOB 1955/07/21, MRN [id number]  PCP:  Timothy Lasso, MD  [facility] Cardiologist:  Jenkins Rouge, MD  [facility] Electrophysiologist:  None   Chief Complaint:   History of Present Illness:    Janice Massey is a 65 y.o. female with a hx of VSD, DM, HLD and HLD seen for follow up.    History of restrictive VSD.  Last echocardiogram June 2022 showed stable small membranous VSD, mild LVH, EF of 60 to 65% and grade 1 diastolic dysfunction.  No significant abnormality.  Patient is here for follow with sign language interpreter.  She walks for 1 hour 3 days per week.  No chest pain or shortness of breath.  Occasional leg cramps and without walking.  Denies orthopnea, PND, syncope, lower extremity edema or melena.  Initially tachycardic on arrival but improved with rest.  Past Medical History:  Diagnosis Date   Atypical chest pain    myoview 10/29/10 - Post-stress EF 56%. Low risk and negative for ischemia    Back pain    Cardiac murmur    small membranous VSD with fairly loud cardiac murmur (asymptomatic)    Chronic PID (chronic pelvic inflammatory disease)    Deaf    Since age 28   Diabetes mellitus    Dizziness    Dyslipidemia    Dyspnea    History of Doppler ultrasound 06/20/09   Normal, no prior studies for comparison.    Hypertension    Lightheadedness    Mute    Obese    URI (upper respiratory infection)    Ventricular septal defect 2004 per echo   small membranous VSD    Past Surgical History:  Procedure Laterality Date   CARDIAC CATHETERIZATION  08/23/95   CHOLECYSTECTOMY  11/04/1993   COLPOSCOPY     ENDOMETRIAL BIOPSY  02/29/2020       TUBAL LIGATION  10/05/1978    Current Medications: Current Meds  Medication Sig   aspirin 81 MG chewable tablet Chew 1 tablet (81 mg total) by mouth daily.   atorvastatin (LIPITOR) 40 MG tablet Take 1 tablet (40 mg total) by mouth daily.   canagliflozin  (INVOKANA) 300 MG TABS tablet Take 1 tablet (300 mg total) by mouth daily before breakfast.   cetirizine (ZYRTEC) 10 MG tablet Take 1 tablet (10 mg total) by mouth daily as needed for allergies.   diltiazem (CARDIZEM CD) 120 MG 24 hr capsule Take once daily.   fluticasone (FLONASE) 50 MCG/ACT nasal spray SHAKE LIQUID AND USE 2 SPRAYS IN EACH NOSTRIL DAILY   hydrochlorothiazide (HYDRODIURIL) 25 MG tablet TAKE 1 TABLET(25 MG) BY MOUTH DAILY   losartan (COZAAR) 100 MG tablet Take 1 tablet (100 mg total) by mouth daily.   meloxicam (MOBIC) 15 MG tablet Take 1 tablet (15 mg total) by mouth daily.   metFORMIN (GLUCOPHAGE) 1000 MG tablet TAKE 1 TABLET(1000 MG) BY MOUTH TWICE DAILY   omeprazole (PRILOSEC) 40 MG capsule Take 1 capsule (40 mg total) by mouth daily.   sitaGLIPtin (JANUVIA) 100 MG tablet TAKE 1 TABLET(100 MG) BY MOUTH DAILY     Allergies:   Codeine, Lisinopril, and Penicillins   Social History   Socioeconomic History   Marital status: Single    Spouse name: Not on file   Number of children: 1   Years of education: Not on file   Highest education level: Not on file  Occupational History  Employer: UNEMPLOYED  Tobacco Use   Smoking status: Never   Smokeless tobacco: Never  Vaping Use   Vaping Use: Never used  Substance and Sexual Activity   Alcohol use: No    Alcohol/week: 0.0 standard drinks   Drug use: No   Sexual activity: Not Currently    Birth control/protection: Surgical  Other Topics Concern   Not on file  Social History Narrative   Not on file   Social Determinants of Health   Financial Resource Strain: Not on file  Food Insecurity: Not on file  Transportation Needs: Not on file  Physical Activity: Not on file  Stress: Not on file  Social Connections: Not on file     Family History: The patient's family history includes Breast cancer in her cousin; CAD (age of onset: 43) in her father; Cancer in her mother; Diabetes in her father, maternal grandfather,  and paternal aunt; Heart attack in her father; Heart attack (age of onset: 77) in her mother; Hypertension in her father. There is no history of Colon cancer, Endometrial cancer, Pancreatic cancer, Prostate cancer, or Ovarian cancer.    ROS:   Please see the history of present illness.    All other systems reviewed and are negative.   EKGs/Labs/Other Studies Reviewed:    The following studies were reviewed today:  Echo 6/22 1. Small membranous VSD. Left ventricular ejection fraction, by  estimation, is 60 to 65%. The left ventricle has normal function. The left  ventricle has no regional wall motion abnormalities. There is mild left  ventricular hypertrophy. Left ventricular  diastolic parameters are consistent with Grade I diastolic dysfunction  (impaired relaxation).   2. Right ventricular systolic function is normal. The right ventricular  size is normal.   3. The mitral valve is grossly normal. No evidence of mitral valve  regurgitation.   4. The aortic valve is tricuspid. Aortic valve regurgitation is not  visualized.   5. The inferior vena cava is normal in size with greater than 50%  respiratory variability, suggesting right atrial pressure of 3 mmHg.   Comparison(s): No significant change from prior study. 06/21/2019: LVEF  60-65%, grade 1 DD, small membranous VSD.   EKG:  EKG is ordered today.  The ekg ordered today demonstrates sinus tachycardia at rate of 107 bpm  Recent Labs: 01/28/2020: BUN 16; Creatinine, Ser 0.93; Potassium 3.8; Sodium 141  Recent Lipid Panel    Component Value Date/Time   CHOL 169 07/17/2020 1557   TRIG 105 07/17/2020 1557   HDL 64 07/17/2020 1557   CHOLHDL 2.6 07/17/2020 1557   CHOLHDL 2.5 10/10/2013 1441   VLDL 34 10/10/2013 1441   LDLCALC 86 07/17/2020 1557   Physical Exam:    VS:  BP 110/76   Pulse (!) 107   Ht '5\' 7"'$  (1.702 m)   Wt 186 lb (84.4 kg)   SpO2 97%   BMI 29.13 kg/m     Wt Readings from Last 3 Encounters:  08/06/20  186 lb (84.4 kg)  07/17/20 184 lb 9.6 oz (83.7 kg)  04/10/20 186 lb 12.8 oz (84.7 kg)     GEN:  Well nourished, well developed in no acute distress HEENT: Normal NECK: No JVD; No carotid bruits LYMPHATICS: No lymphadenopathy CARDIAC: RRR, no murmurs, rubs, gallops RESPIRATORY:  Clear to auscultation without rales, wheezing or rhonchi  ABDOMEN: Soft, non-tender, non-distended MUSCULOSKELETAL:  No edema; No deformity  SKIN: Warm and dry NEUROLOGIC:  Alert and oriented x 3 PSYCHIATRIC:  Normal  affect   ASSESSMENT AND PLAN:    VSD Stable on most recent echocardiogram.  2.  Hypertension Blood pressure stable and controlled on current medications.  3.  Diabetes mellitus -Per PCP  4.  Leg cramps -Intermittent.  Ongoing for greater than 2 months.  Check electrolyte and reduced Lipitor to 20 mg to see response.  5.  Hyperlipidemia -Followed by PCP - 07/17/2020: Cholesterol, Total 169; HDL 64; LDL Chol Calc (NIH) 86; Triglycerides 105  -Medication changes temporarily as above  Medication Adjustments/Labs and Tests Ordered: Current medicines are reviewed at length with the patient today.  Concerns regarding medicines are outlined above.  Orders Placed This Encounter  Procedures   Basic Metabolic Panel (BMET)   Magnesium   EKG 12-Lead   No orders of the defined types were placed in this encounter.   Patient Instructions  Medication Instructions:   Your physician recommends that you continue on your current medications as directed. Please refer to the Current Medication list given to you today. Decrease Atorvastatin to 20 mg daily for 2 weeks to see if this helps with leg cramps.   *If you need a refill on your cardiac medications before your next appointment, please call your pharmacy*   Lab Work: Lab work to be done today--BMP and Magnesium If you have labs (blood work) drawn today and your tests are completely normal, you will receive your results only by: [place] (if you have MyChart) OR A paper copy in the mail If you have any lab test that is abnormal or we need to change your treatment, we will call you to review the results.   Testing/Procedures: none   Follow-Up: At [facility], you and your health needs are our priority.  As part of our continuing mission to provide you with exceptional heart care, we have created designated Provider Care Teams.  These Care Teams include your primary Cardiologist (physician) and Advanced Practice Providers (APPs -  Physician Assistants and Nurse Practitioners) who all work together to provide you with the care you need, when you need it.  We recommend signing up for the patient portal called "MyChart".  Sign up information is provided on this After Visit Summary.  MyChart is used to connect with patients for Virtual Visits (Telemedicine).  Patients are able to view lab/test results, encounter notes, upcoming appointments, etc.  Non-urgent messages can be sent to your provider as well.   To learn more about what you can do with MyChart, go to [email or web address].    Your next appointment:   12 month(s)  The format for your next appointment:   In Person  Provider:   You may see Jenkins Rouge, MD or one of the following Advanced Practice Providers on your designated Care Team:   Cecilie Kicks, NP    Other Instructions    Signed, Leanor Kail, [place]  08/06/2020 12:16 PM    [place]

## 2020-08-06 NOTE — Patient Instructions (Addendum)
Medication Instructions:   Your physician recommends that you continue on your current medications as directed. Please refer to the Current Medication list given to you today. Decrease Atorvastatin to 20 mg daily for 2 weeks to see if this helps with leg cramps.   *If you need a refill on your cardiac medications before your next appointment, please call your pharmacy*   Lab Work: Lab work to be done today--BMP and Magnesium If you have labs (blood work) drawn today and your tests are completely normal, you will receive your results only by: [place] (if you have MyChart) OR A paper copy in the mail If you have any lab test that is abnormal or we need to change your treatment, we will call you to review the results.   Testing/Procedures: none   Follow-Up: At [facility], you and your health needs are our priority.  As part of our continuing mission to provide you with exceptional heart care, we have created designated Provider Care Teams.  These Care Teams include your primary Cardiologist (physician) and Advanced Practice Providers (APPs -  Physician Assistants and Nurse Practitioners) who all work together to provide you with the care you need, when you need it.  We recommend signing up for the patient portal called "MyChart".  Sign up information is provided on this After Visit Summary.  MyChart is used to connect with patients for Virtual Visits (Telemedicine).  Patients are able to view lab/test results, encounter notes, upcoming appointments, etc.  Non-urgent messages can be sent to your provider as well.   To learn more about what you can do with MyChart, go to [email or web address].    Your next appointment:   12 month(s)  The format for your next appointment:   In Person  Provider:   You may see Jenkins Rouge, MD or one of the following Advanced Practice Providers on your designated Care Team:   Cecilie Kicks, NP    Other Instructions

## 2020-08-13 DIAGNOSIS — M25571 Pain in right ankle and joints of right foot: Secondary | ICD-10-CM | POA: Diagnosis not present

## 2020-08-13 DIAGNOSIS — S46011D Strain of muscle(s) and tendon(s) of the rotator cuff of right shoulder, subsequent encounter: Secondary | ICD-10-CM | POA: Diagnosis not present

## 2020-08-13 DIAGNOSIS — S93401A Sprain of unspecified ligament of right ankle, initial encounter: Secondary | ICD-10-CM | POA: Diagnosis not present

## 2020-08-29 ENCOUNTER — Other Ambulatory Visit: Payer: Self-pay | Admitting: Internal Medicine

## 2020-08-29 DIAGNOSIS — I10 Essential (primary) hypertension: Secondary | ICD-10-CM

## 2020-09-06 ENCOUNTER — Other Ambulatory Visit: Payer: Self-pay | Admitting: Student

## 2020-09-06 DIAGNOSIS — E1129 Type 2 diabetes mellitus with other diabetic kidney complication: Secondary | ICD-10-CM

## 2020-09-09 ENCOUNTER — Other Ambulatory Visit: Payer: Self-pay | Admitting: Student

## 2020-09-09 DIAGNOSIS — E1129 Type 2 diabetes mellitus with other diabetic kidney complication: Secondary | ICD-10-CM

## 2020-10-06 ENCOUNTER — Encounter: Payer: Self-pay | Admitting: Internal Medicine

## 2020-10-06 ENCOUNTER — Ambulatory Visit: Payer: Medicaid Other | Admitting: Internal Medicine

## 2020-10-06 VITALS — BP 145/83 | HR 104 | Temp 98.2°F | Wt 187.5 lb

## 2020-10-06 DIAGNOSIS — J302 Other seasonal allergic rhinitis: Secondary | ICD-10-CM | POA: Diagnosis not present

## 2020-10-06 DIAGNOSIS — E1129 Type 2 diabetes mellitus with other diabetic kidney complication: Secondary | ICD-10-CM

## 2020-10-06 DIAGNOSIS — I10 Essential (primary) hypertension: Secondary | ICD-10-CM

## 2020-10-06 DIAGNOSIS — R809 Proteinuria, unspecified: Secondary | ICD-10-CM

## 2020-10-06 DIAGNOSIS — E785 Hyperlipidemia, unspecified: Secondary | ICD-10-CM | POA: Diagnosis not present

## 2020-10-06 DIAGNOSIS — N950 Postmenopausal bleeding: Secondary | ICD-10-CM

## 2020-10-06 DIAGNOSIS — Z23 Encounter for immunization: Secondary | ICD-10-CM

## 2020-10-06 DIAGNOSIS — Z0000 Encounter for general adult medical examination without abnormal findings: Secondary | ICD-10-CM | POA: Diagnosis not present

## 2020-10-06 LAB — POCT GLYCOSYLATED HEMOGLOBIN (HGB A1C): Hemoglobin A1C: 7.4 % — AB (ref 4.0–5.6)

## 2020-10-06 LAB — GLUCOSE, CAPILLARY: Glucose-Capillary: 139 mg/dL — ABNORMAL HIGH (ref 70–99)

## 2020-10-06 MED ORDER — LATANOPROST 0.005 % OP SOLN: OPHTHALMIC | 1 refills | Status: DC

## 2020-10-06 NOTE — Patient Instructions (Signed)
Continue to take all your medications as you been doing.   I refilled your eye drop medication  Set up Mychart next time, so you can message me whenever you need anything.   Follow up in January, so you can receive your Pneumococcal vaccine.

## 2020-10-06 NOTE — Progress Notes (Signed)
CC: follow up; medication refill; A1c check  HPI:  Janice Massey is a 65 y.o. female with a past medical history stated below and presents today for follow up, medication refill and A1c check. Patient is present with sign language interpreter.  Please see problem based assessment and plan for additional details.  Past Medical History:  Diagnosis Date   Atypical chest pain    myoview 10/29/10 - Post-stress EF 56%. Low risk and negative for ischemia    Back pain    Cardiac murmur    small membranous VSD with fairly loud cardiac murmur (asymptomatic)    Chronic PID (chronic pelvic inflammatory disease)    Deaf    Since age 89   Diabetes mellitus    Dizziness    Dyslipidemia    Dyspnea    History of Doppler ultrasound 06/20/09   Normal, no prior studies for comparison.    Hypertension    Lightheadedness    Mute    Obese    URI (upper respiratory infection)    Ventricular septal defect 2004 per echo   small membranous VSD    Current Outpatient Medications on File Prior to Visit  Medication Sig Dispense Refill   aspirin 81 MG chewable tablet Chew 1 tablet (81 mg total) by mouth daily. 30 tablet 3   atorvastatin (LIPITOR) 40 MG tablet Take 1 tablet (40 mg total) by mouth daily. 90 tablet 0   canagliflozin (INVOKANA) 300 MG TABS tablet Take 1 tablet (300 mg total) by mouth daily before breakfast. 30 tablet 2   cetirizine (ZYRTEC) 10 MG tablet Take 1 tablet (10 mg total) by mouth daily as needed for allergies. 90 tablet 1   diltiazem (CARDIZEM CD) 120 MG 24 hr capsule Take once daily. 90 capsule 0   fluticasone (FLONASE) 50 MCG/ACT nasal spray SHAKE LIQUID AND USE 2 SPRAYS IN EACH NOSTRIL DAILY 16 g 2   hydrochlorothiazide (HYDRODIURIL) 25 MG tablet TAKE 1 TABLET(25 MG) BY MOUTH DAILY 30 tablet 5   losartan (COZAAR) 100 MG tablet Take 1 tablet (100 mg total) by mouth daily. 90 tablet 2   meloxicam (MOBIC) 15 MG tablet Take 1 tablet (15 mg total) by mouth daily. 30 tablet 1    metFORMIN (GLUCOPHAGE) 1000 MG tablet TAKE 1 TABLET(1000 MG) BY MOUTH TWICE DAILY 180 tablet 1   omeprazole (PRILOSEC) 40 MG capsule Take 1 capsule (40 mg total) by mouth daily. 30 capsule 0   ondansetron (ZOFRAN) 4 MG tablet Take 1 tablet (4 mg total) by mouth every 6 (six) hours as needed for nausea or vomiting. (Patient not taking: Reported on 08/06/2020) 12 tablet 0   sitaGLIPtin (JANUVIA) 100 MG tablet TAKE 1 TABLET(100 MG) BY MOUTH DAILY 30 tablet 5   No current facility-administered medications on file prior to visit.    Family History  Problem Relation Age of Onset   Cancer Mother        liver   Heart attack Mother 4   Hypertension Father    Diabetes Father    Heart attack Father    CAD Father 66   Diabetes Paternal Aunt    Diabetes Maternal Grandfather    Breast cancer Cousin    Colon cancer Neg Hx    Endometrial cancer Neg Hx    Pancreatic cancer Neg Hx    Prostate cancer Neg Hx    Ovarian cancer Neg Hx     Social History   Socioeconomic History   Marital status: Single  Spouse name: Not on file   Number of children: 1   Years of education: Not on file   Highest education level: Not on file  Occupational History    Employer: UNEMPLOYED  Tobacco Use   Smoking status: Never   Smokeless tobacco: Never  Vaping Use   Vaping Use: Never used  Substance and Sexual Activity   Alcohol use: No    Alcohol/week: 0.0 standard drinks   Drug use: No   Sexual activity: Not Currently    Birth control/protection: Surgical  Other Topics Concern   Not on file  Social History Narrative   Not on file   Social Determinants of Health   Financial Resource Strain: Not on file  Food Insecurity: Not on file  Transportation Needs: Not on file  Physical Activity: Not on file  Stress: Not on file  Social Connections: Not on file  Intimate Partner Violence: Not on file    Review of Systems: ROS negative except for what is noted on the assessment and plan.  Vitals:    10/06/20 1025  BP: (!) 145/83  Pulse: (!) 104  Temp: 98.2 F (36.8 C)  SpO2: 97%  Weight: 187 lb 8 oz (85 kg)     Physical Exam: Constitutional: well-appearing, sitting in chair comfortably, in no acute distress HENT: normocephalic atraumatic, mucous membranes moist Eyes: conjunctiva non-erythematous Neck: supple Cardiovascular: regular rate and rhythm, no m/r/g Pulmonary/Chest: normal work of breathing on room air, lungs clear to auscultation bilaterally Abdominal: soft, non-tender, non-distended MSK: normal bulk and tone Neurological: alert & oriented x 3, 5/5 strength in bilateral upper and lower extremities, normal gait Skin: warm and dry Psych: normal mood, appropriate behavior   Assessment & Plan:   See Encounters Tab for problem based charting.  Patient seen with Dr. Nicholes Rough, M.D. [facility], PGY-1 Pager: [phone], Phone: [phone] Date 10/06/2020 Time 11:33 AM

## 2020-10-06 NOTE — Assessment & Plan Note (Signed)
Current A1c today 7.4%; improved from 3 months ago, 7.5% (July 2022) Patient is compliant with her diabetic medications; has no complaints   PLAN: Continue diabetic medications as directed Repeat A1c in 3 months Counseled on improving diet and exercise

## 2020-10-06 NOTE — Assessment & Plan Note (Signed)
Patient is stable and compliant on current BP medication regimen. BP has been at goal   PLAN:  Continue BP medication regimen

## 2020-10-06 NOTE — Assessment & Plan Note (Signed)
Patient received flu vaccine today. She is up to date with COVID vaccinations Patient has received 1st dose of Shingles vaccine   PLAN: Patient plans to go back to pharmacy to receive second dose of shingles vaccine

## 2020-10-06 NOTE — Assessment & Plan Note (Addendum)
Patient is compliant with current regimen, atorvastatin 40mg  daily. Patient is tolerating medication well. Lipid profile obtained last year-WNL  PLAN: Continue medication as directed

## 2020-10-08 NOTE — Progress Notes (Signed)
Internal Medicine Clinic Attending  I saw and evaluated the patient.  I personally confirmed the key portions of the history and exam documented by Dr. Ariwodo and I reviewed pertinent patient test results.  The assessment, diagnosis, and plan were formulated together and I agree with the documentation in the resident's note.   

## 2020-10-16 ENCOUNTER — Other Ambulatory Visit: Payer: Self-pay | Admitting: Student

## 2020-10-16 DIAGNOSIS — E785 Hyperlipidemia, unspecified: Secondary | ICD-10-CM

## 2020-10-19 ENCOUNTER — Other Ambulatory Visit: Payer: Self-pay | Admitting: Student

## 2020-10-19 DIAGNOSIS — E1129 Type 2 diabetes mellitus with other diabetic kidney complication: Secondary | ICD-10-CM

## 2020-11-10 ENCOUNTER — Other Ambulatory Visit: Payer: Self-pay

## 2020-11-10 ENCOUNTER — Other Ambulatory Visit: Payer: Self-pay | Admitting: Student

## 2020-11-10 DIAGNOSIS — E1129 Type 2 diabetes mellitus with other diabetic kidney complication: Secondary | ICD-10-CM

## 2020-11-10 DIAGNOSIS — K219 Gastro-esophageal reflux disease without esophagitis: Secondary | ICD-10-CM

## 2020-11-10 MED ORDER — CANAGLIFLOZIN 300 MG PO TABS: 300.0000 mg | ORAL_TABLET | Freq: Every day | ORAL | 3 refills | Status: DC

## 2020-11-12 ENCOUNTER — Other Ambulatory Visit: Payer: Self-pay

## 2020-11-12 ENCOUNTER — Telehealth: Payer: Self-pay

## 2020-11-12 NOTE — Telephone Encounter (Signed)
Return pt's call - no one answered. Left message call has been returned and to call the office.

## 2020-11-12 NOTE — Telephone Encounter (Signed)
Pt is requesting a call back she is wanting a  medication to be sent to another pharmacy because the one she is using does not take her insurance but the medication she was saying is not on her medication list

## 2020-11-18 ENCOUNTER — Other Ambulatory Visit: Payer: Self-pay

## 2020-11-18 ENCOUNTER — Encounter: Payer: Self-pay | Admitting: Internal Medicine

## 2020-11-18 ENCOUNTER — Ambulatory Visit: Payer: Medicaid Other | Admitting: Internal Medicine

## 2020-11-18 VITALS — BP 137/77 | HR 99 | Temp 98.2°F | Ht 67.0 in | Wt 192.0 lb

## 2020-11-18 DIAGNOSIS — E1129 Type 2 diabetes mellitus with other diabetic kidney complication: Secondary | ICD-10-CM

## 2020-11-18 DIAGNOSIS — N183 Chronic kidney disease, stage 3 unspecified: Secondary | ICD-10-CM

## 2020-11-18 DIAGNOSIS — I129 Hypertensive chronic kidney disease with stage 1 through stage 4 chronic kidney disease, or unspecified chronic kidney disease: Secondary | ICD-10-CM | POA: Diagnosis not present

## 2020-11-18 DIAGNOSIS — J302 Other seasonal allergic rhinitis: Secondary | ICD-10-CM | POA: Diagnosis not present

## 2020-11-18 DIAGNOSIS — Z23 Encounter for immunization: Secondary | ICD-10-CM

## 2020-11-18 DIAGNOSIS — Z0000 Encounter for general adult medical examination without abnormal findings: Secondary | ICD-10-CM

## 2020-11-18 DIAGNOSIS — E785 Hyperlipidemia, unspecified: Secondary | ICD-10-CM

## 2020-11-18 DIAGNOSIS — R809 Proteinuria, unspecified: Secondary | ICD-10-CM

## 2020-11-18 DIAGNOSIS — I10 Essential (primary) hypertension: Secondary | ICD-10-CM

## 2020-11-18 MED ORDER — FLUTICASONE PROPIONATE 50 MCG/ACT NA SUSP: NASAL | 2 refills | Status: DC

## 2020-11-18 MED ORDER — METFORMIN HCL 1000 MG PO TABS: ORAL_TABLET | ORAL | 1 refills | Status: DC

## 2020-11-18 MED ORDER — CANAGLIFLOZIN 300 MG PO TABS: 300.0000 mg | ORAL_TABLET | Freq: Every day | ORAL | 3 refills | Status: DC

## 2020-11-18 MED ORDER — LOSARTAN POTASSIUM 100 MG PO TABS: 100.0000 mg | ORAL_TABLET | Freq: Every day | ORAL | 2 refills | Status: DC

## 2020-11-18 MED ORDER — CETIRIZINE HCL 10 MG PO TABS: 10.0000 mg | ORAL_TABLET | Freq: Every day | ORAL | 1 refills | Status: DC | PRN

## 2020-11-18 MED ORDER — HYDROCHLOROTHIAZIDE 25 MG PO TABS: ORAL_TABLET | ORAL | 5 refills | Status: DC

## 2020-11-18 MED ORDER — ASPIRIN 81 MG PO CHEW: 81.0000 mg | CHEWABLE_TABLET | Freq: Every day | ORAL | 3 refills | Status: DC

## 2020-11-18 MED ORDER — SITAGLIPTIN PHOSPHATE 100 MG PO TABS: ORAL_TABLET | ORAL | 5 refills | Status: DC

## 2020-11-18 MED ORDER — LOSARTAN POTASSIUM 100 MG PO TABS
100.0000 mg | ORAL_TABLET | Freq: Every day | ORAL | 2 refills | Status: DC
Start: 2020-11-18 — End: 2021-03-02

## 2020-11-18 NOTE — Assessment & Plan Note (Addendum)
No changes from office visit 1 mo ago. Continue atorvastatin 40 mg qd. Excellent medication compliance, with no reported adverse effects.

## 2020-11-18 NOTE — Assessment & Plan Note (Addendum)
Pneumococcal vaccine given  Medications refilled, and sent to preferred pharmacy, [facility].

## 2020-11-18 NOTE — Progress Notes (Signed)
CC: medication refill, health maintenance for pneumococcal vaccine  HPI:  Janice Massey is a 65 y.o. female with a PMHx stated below and presents today for stated above. Please see the Encounters tab for problem-based Assessment & Plan for additional details.   Past Medical History:  Diagnosis Date   Back pain    Cardiac murmur    small membranous VSD with fairly loud cardiac murmur (asymptomatic)    Chronic PID (chronic pelvic inflammatory disease)    Deaf    Since age 53   Diabetes mellitus    History of Doppler ultrasound 06/20/09   Normal, no prior studies for comparison.    Hypertension    Mute    Obese    URI (upper respiratory infection)    Ventricular septal defect 2004 per echo   small membranous VSD    Current Outpatient Medications on File Prior to Visit  Medication Sig Dispense Refill   atorvastatin (LIPITOR) 40 MG tablet TAKE 1 TABLET(40 MG) BY MOUTH DAILY 90 tablet 0   diltiazem (CARDIZEM CD) 120 MG 24 hr capsule Take once daily. 90 capsule 0   latanoprost (XALATAN) 0.005 % ophthalmic solution INSTILL 1 DROP IN BOTH EYES EVERY DAY IN THE EVENING 2.5 mL 1   meloxicam (MOBIC) 15 MG tablet Take 1 tablet (15 mg total) by mouth daily. 30 tablet 1   omeprazole (PRILOSEC) 40 MG capsule TAKE 1 CAPSULE(40 MG) BY MOUTH DAILY 30 capsule 3   ondansetron (ZOFRAN) 4 MG tablet Take 1 tablet (4 mg total) by mouth every 6 (six) hours as needed for nausea or vomiting. (Patient not taking: Reported on 08/06/2020) 12 tablet 0   No current facility-administered medications on file prior to visit.    Family History  Problem Relation Age of Onset   Cancer Mother        liver   Heart attack Mother 61   Hypertension Father    Diabetes Father    Heart attack Father    CAD Father 23   Diabetes Paternal Aunt    Diabetes Maternal Grandfather    Breast cancer Cousin    Colon cancer Neg Hx    Endometrial cancer Neg Hx    Pancreatic cancer Neg Hx    Prostate cancer Neg Hx     Ovarian cancer Neg Hx     Social History   Socioeconomic History   Marital status: Single    Spouse name: Not on file   Number of children: 1   Years of education: Not on file   Highest education level: Not on file  Occupational History    Employer: UNEMPLOYED  Tobacco Use   Smoking status: Never   Smokeless tobacco: Never  Vaping Use   Vaping Use: Never used  Substance and Sexual Activity   Alcohol use: No    Alcohol/week: 0.0 standard drinks   Drug use: No   Sexual activity: Not Currently    Birth control/protection: Surgical  Other Topics Concern   Not on file  Social History Narrative   Not on file   Social Determinants of Health   Financial Resource Strain: Not on file  Food Insecurity: Not on file  Transportation Needs: Not on file  Physical Activity: Not on file  Stress: Not on file  Social Connections: Not on file  Intimate Partner Violence: Not on file    Review of Systems: ROS negative except for what is noted on the assessment and plan.  Vitals:   11/18/20 1013  11/18/20 1043 11/18/20 1044  BP: 137/77    Pulse: 99    Temp: 98.2 F (36.8 C)    TempSrc: Oral    SpO2: (!) 89% 95% 95%  Weight: 192 lb (87.1 kg)    Height: 5\' 7"  (1.702 m)       Physical Exam: Constitutional: alert, well-appearing, in NAD HENT: normocephalic, atraumatic, mucous membranes moist Eyes: conjunctiva non-erythematous, EOMI Cardiovascular: RRR, no m/r/g, non-edematous bilateral LE Pulmonary/Chest: normal work of breathing on RA, LCTAB Abdominal: soft, non-tender to palpation, non-distended Neurological: A&O x 3, 5/5 strength in bilateral upper and lower extremities   Assessment & Plan:   See Encounters Tab for problem based charting.  Patient seen with Dr. Lavonda Jumbo, MD  Internal Medicine Resident, PGY-1 [facility]

## 2020-11-18 NOTE — Assessment & Plan Note (Addendum)
A1c 7.4% 1 mo ago; improved from 4 months ago, 7.5% (July 2022). A1c due in 2 months, will follow up then. Good medication compliance. Tolerating medication without adverse effects. Counseled on the benefits of daily exercise, limiting processed foods and high sugar foods, and weight loss. No hypoglycemic episodes. Denies polydipsia, polyuria, weight loss, lethargy, blurry vision, and changes in sensation. Benign physical exam and vitals wnl.    Continue current diabetic regimen with no medication changes this visit.  Encouraged to continue lifestyle modifications A1c during 2 mo f/u in Jan Pneumococcal vaccine given

## 2020-11-18 NOTE — Patient Instructions (Signed)
Thank you, [name] for allowing [place] to provide your care today!  Today we discussed:  High blood pressure: today your blood pressure is slightly high. Please try to get a cuff and start measuring it daily and write it down and bring during next visit. Continue to take all your medications, no changes we made. Refills were sent to the correct pharmacy.  Diabetes: continue eating healthy and exercising daily. We will see you in 2 months for labs  Cholesterol: Continue taking your medications.   You got the pneumococcal vaccine.   Refilled: Meds ordered this encounter  Medications   aspirin 81 MG chewable tablet    Sig: Chew 1 tablet (81 mg total) by mouth daily.    Dispense:  30 tablet    Refill:  3   canagliflozin (INVOKANA) 300 MG TABS tablet    Sig: Take 1 tablet (300 mg total) by mouth daily before breakfast.    Dispense:  90 tablet    Refill:  3   hydrochlorothiazide (HYDRODIURIL) 25 MG tablet    Sig: TAKE 1 TABLET(25 MG) BY MOUTH DAILY    Dispense:  30 tablet    Refill:  5   cetirizine (ZYRTEC) 10 MG tablet    Sig: Take 1 tablet (10 mg total) by mouth daily as needed for allergies.    Dispense:  90 tablet    Refill:  1   losartan (COZAAR) 100 MG tablet    Sig: Take 1 tablet (100 mg total) by mouth daily.    Dispense:  90 tablet    Refill:  2   metFORMIN (GLUCOPHAGE) 1000 MG tablet    Sig: TAKE 1 TABLET(1000 MG) BY MOUTH TWICE DAILY    Dispense:  180 tablet    Refill:  1   sitaGLIPtin (JANUVIA) 100 MG tablet    Sig: TAKE 1 TABLET(100 MG) BY MOUTH DAILY    Dispense:  30 tablet    Refill:  5   fluticasone (FLONASE) 50 MCG/ACT nasal spray    Sig: SHAKE LIQUID AND USE 2 SPRAYS IN EACH NOSTRIL DAILY    Dispense:  16 g    Refill:  2     Follow up in: 2 months    Should you have any questions or concerns please call the internal medicine clinic at [phone].     Lajean Manes, MD  Internal Medicine Resident, PGY-1 [facility]

## 2020-11-18 NOTE — Assessment & Plan Note (Addendum)
BP 137/77 today. Not measring BP at home, advised to start and bring log during next visit. BP closer to goal ( <130/80) on current regimen. Good medication compliance. Will assess home BP log before determining if any medication need to be adjusted. Tolerating current medication without adverse effects. Pt continues to try to make lifestyle modifications; counseled on the importance of daily exercise, low salt diet, and weight loss.Denies headaches, vision changes, chest pain, SHOB, or leg swelling. Exam benign and vitals otherwise wnl.    Continue current regimen with no medication changes Encouraged to continue lifestyle modifications BP log and bring to next visit  BMP at 2 mo f/u visit

## 2020-11-21 NOTE — Progress Notes (Signed)
Internal Medicine Clinic Attending  I saw and evaluated the patient.  I personally confirmed the key portions of the history and exam documented by Dr. Patel and I reviewed pertinent patient test results.  The assessment, diagnosis, and plan were formulated together and I agree with the documentation in the resident's note.  

## 2020-11-26 DIAGNOSIS — M545 Low back pain, unspecified: Secondary | ICD-10-CM | POA: Diagnosis not present

## 2020-12-16 ENCOUNTER — Other Ambulatory Visit: Payer: Self-pay | Admitting: Internal Medicine

## 2020-12-16 DIAGNOSIS — I10 Essential (primary) hypertension: Secondary | ICD-10-CM

## 2021-01-06 ENCOUNTER — Encounter: Payer: Medicaid Other | Admitting: Internal Medicine

## 2021-01-16 ENCOUNTER — Other Ambulatory Visit: Payer: Self-pay

## 2021-01-16 ENCOUNTER — Encounter: Payer: Self-pay | Admitting: Student

## 2021-01-16 ENCOUNTER — Ambulatory Visit (INDEPENDENT_AMBULATORY_CARE_PROVIDER_SITE_OTHER): Payer: Medicaid Other | Admitting: Student

## 2021-01-16 VITALS — BP 141/81 | HR 82 | Temp 98.1°F | Ht 67.0 in | Wt 193.2 lb

## 2021-01-16 DIAGNOSIS — Z1331 Encounter for screening for depression: Secondary | ICD-10-CM | POA: Diagnosis not present

## 2021-01-16 DIAGNOSIS — R809 Proteinuria, unspecified: Secondary | ICD-10-CM | POA: Diagnosis not present

## 2021-01-16 DIAGNOSIS — I10 Essential (primary) hypertension: Secondary | ICD-10-CM | POA: Diagnosis not present

## 2021-01-16 DIAGNOSIS — E1129 Type 2 diabetes mellitus with other diabetic kidney complication: Secondary | ICD-10-CM | POA: Diagnosis not present

## 2021-01-16 LAB — GLUCOSE, CAPILLARY: Glucose-Capillary: 224 mg/dL — ABNORMAL HIGH (ref 70–99)

## 2021-01-16 MED ORDER — CHLORTHALIDONE 25 MG PO TABS: 25.0000 mg | ORAL_TABLET | Freq: Every day | ORAL | 3 refills | Status: DC

## 2021-01-16 MED ORDER — ACCU-CHEK FASTCLIX LANCETS MISC: 1.0000 [IU] | Freq: Every day | 3 refills | Status: AC

## 2021-01-16 MED ORDER — ACCU-CHEK GUIDE VI STRP: ORAL_STRIP | 12 refills | Status: AC

## 2021-01-16 MED ORDER — ACCU-CHEK GUIDE W/DEVICE KIT: 1.0000 [IU] | PACK | Freq: Every day | 0 refills | Status: AC

## 2021-01-16 NOTE — Patient Instructions (Signed)
It was a pleasure seeing you in clinic. Today we discussed:   Diabetes: Please check you blood sugar in the morning once daily before meals. I will talk to our pharmacist to see if we can restart you on invokana or a similar medication  BP Stop taking HCTZ Start chlorthalidone 25 mg daily  Labwork: We will check you labs today   If you have any questions or concerns, please call our clinic at [phone] between 9am-5pm and after hours call [phone] and ask for the internal medicine resident on call. If you feel you are having a medical emergency please call 911.   Thank you, we look forward to helping you remain healthy!

## 2021-01-17 LAB — BMP8+ANION GAP
Anion Gap: 17 mmol/L (ref 10.0–18.0)
BUN/Creatinine Ratio: 17 (ref 12–28)
BUN: 13 mg/dL (ref 8–27)
CO2: 24 mmol/L (ref 20–29)
Calcium: 9.8 mg/dL (ref 8.7–10.3)
Chloride: 93 mmol/L — ABNORMAL LOW (ref 96–106)
Creatinine, Ser: 0.77 mg/dL (ref 0.57–1.00)
Glucose: 219 mg/dL — ABNORMAL HIGH (ref 70–99)
Potassium: 3.7 mmol/L (ref 3.5–5.2)
Sodium: 134 mmol/L (ref 134–144)
eGFR: 86 mL/min/{1.73_m2} (ref >59)

## 2021-01-17 LAB — HEMOGLOBIN A1C
Est. average glucose Bld gHb Est-mCnc: 306 mg/dL
Hgb A1c MFr Bld: 12.3 % — ABNORMAL HIGH (ref 4.8–5.6)

## 2021-01-19 DIAGNOSIS — Z1331 Encounter for screening for depression: Secondary | ICD-10-CM | POA: Insufficient documentation

## 2021-01-19 NOTE — Progress Notes (Signed)
° °  CC: diabetes follow up  HPI:  [name] is a 66 y.o. female with history listed below presents for follow up of diabetes. Patient interviewed with interpretor present. Please refer to problem based charting for further details and assessment and plan of current problem and chronic medical conditions.   Past Medical History:  Diagnosis Date   Back pain    Cardiac murmur    small membranous VSD with fairly loud cardiac murmur (asymptomatic)    Chronic PID (chronic pelvic inflammatory disease)    Deaf    Since age 65   Diabetes mellitus    History of Doppler ultrasound 06/20/09   Normal, no prior studies for comparison.    Hypertension    Mute    Obese    URI (upper respiratory infection)    Ventricular septal defect 2004 per echo   small membranous VSD   Review of Systems:  negative as per HPI  Physical Exam:  Vitals:   01/16/21 1104 01/16/21 1148  BP: (!) 141/81 (!) 141/81  Pulse: 90 82  Temp: 98.1 F (36.7 C)   TempSrc: Oral   SpO2: 95%   Weight: 193 lb 3.2 oz (87.6 kg)   Height: 5\' 7"  (1.702 m)    Constitutional: Appears well-developed and well-nourished. No distress.  HENT: Normocephalic and atraumatic, EOMI, conjunctiva normal, moist mucous membranes Cardiovascular: Normal rate, regular rhythm Respiratory: No respiratory distress, no accessory muscle use.  Effort is normal.  Lungs are clear to auscultation bilaterally. GI: Nondistended, soft, nontender to palpation, normal active bowel sounds Musculoskeletal: Normal bulk and tone.  No peripheral edema noted. Neurological: Is alert and oriented x4, Skin: Warm and dry.  No rash, erythema, lesions noted. Psychiatric: Normal mood  Assessment & Plan:   See Encounters Tab for problem based charting.  Patient was Dr.  Saverio Danker

## 2021-01-19 NOTE — Assessment & Plan Note (Signed)
She has not taking her canagliflozin in about a months.  States insurance has not been covering this.  Continues to be compliant with her metformin 1000 twice daily, Januvia 100 mg daily.  She does not have a meter and does not check her sugars at home.  Preliminary POCC A1c of 12.4 will check serum A1c today.  States she has been trying to walk but weather has made this difficult.  Discussed trying at home exercises or going to the gym if she is unable to walk outside.  She denies polydipsia, polyuria, blurred vision, changes in weight.  Initially states no significant changes to her diet.  However daughter was on the phone and states that she has been drinking 1-2 sodas or having orange juice almost daily.  Patient states she was drinking a lot of orange juice when she was feeling sick a couple weeks ago.  Advised that she reduce the amount of sugary beverages she is drinking given her elevation in her A1c.  Continue metformin 1000 mg daily Continue Januvia Serum A1c elevated at 12.3 today Referral to clinical pharmacist to see if alternate SGLT2 may be covered Meter ordered Follow up in 1 month

## 2021-01-19 NOTE — Assessment & Plan Note (Addendum)
BP elevated today at 141/81 and remained elevated on repeat.  Reports compliance with hydrochlorothiazide 25 mg daily, losartan 100 mg daily, and diltiazem 120 mg daily.  Check her blood pressure at home.  Given elevation in her blood pressure will change hydrochlorothiazide to chlorthalidone 25 mg daily.  Start chlorthalidone 25 mg daily Continue losartan 100 mg daily  follow up blood pressure in 1 month BMP today wnl

## 2021-01-19 NOTE — Assessment & Plan Note (Signed)
Noted have elevation in PHQ-9 today of 16.  Patient states she has had depression in the past and does not currently feel depressed.  She denies any issues with her mood, sleep, energy, or eating.  States she is frequently frustrated as she has been living with her daughter and feels like she needs her own space to be more independent.  She has been unable to find new housing given the current housing market.  She will continue to work on trying to find her own apartment.  Declines further evaluation for her depression today.  We will revisit if PHQ remains elevated.

## 2021-01-20 NOTE — Progress Notes (Signed)
Internal Medicine Clinic Attending  Case discussed with Dr. Lisabeth Devoid  At the time of the visit.  We reviewed the residents history and exam and pertinent patient test results.  I agree with the assessment, diagnosis, and plan of care documented in the residents note. Will likely need more aggressive regimen for uncontrolled diabetes, however will start with obtaining a glucometer and monitoring BG at home, dietary changes as discussed in the resident's note, and investigation of alternative SGLT2i.

## 2021-02-02 ENCOUNTER — Other Ambulatory Visit: Payer: Self-pay

## 2021-02-02 DIAGNOSIS — E785 Hyperlipidemia, unspecified: Secondary | ICD-10-CM

## 2021-02-03 MED ORDER — ATORVASTATIN CALCIUM 40 MG PO TABS: 40.0000 mg | ORAL_TABLET | Freq: Every day | ORAL | 2 refills | Status: DC

## 2021-02-16 ENCOUNTER — Other Ambulatory Visit: Payer: Self-pay | Admitting: Student

## 2021-02-16 ENCOUNTER — Encounter: Payer: Medicaid Other | Admitting: Pharmacist

## 2021-02-16 ENCOUNTER — Encounter: Payer: Medicaid Other | Admitting: Internal Medicine

## 2021-02-16 DIAGNOSIS — Z1231 Encounter for screening mammogram for malignant neoplasm of breast: Secondary | ICD-10-CM

## 2021-03-02 ENCOUNTER — Ambulatory Visit (INDEPENDENT_AMBULATORY_CARE_PROVIDER_SITE_OTHER): Payer: Medicaid Other | Admitting: Internal Medicine

## 2021-03-02 ENCOUNTER — Encounter: Payer: Self-pay | Admitting: Internal Medicine

## 2021-03-02 DIAGNOSIS — I129 Hypertensive chronic kidney disease with stage 1 through stage 4 chronic kidney disease, or unspecified chronic kidney disease: Secondary | ICD-10-CM | POA: Diagnosis not present

## 2021-03-02 DIAGNOSIS — R809 Proteinuria, unspecified: Secondary | ICD-10-CM

## 2021-03-02 DIAGNOSIS — I10 Essential (primary) hypertension: Secondary | ICD-10-CM

## 2021-03-02 DIAGNOSIS — E1122 Type 2 diabetes mellitus with diabetic chronic kidney disease: Secondary | ICD-10-CM | POA: Diagnosis not present

## 2021-03-02 DIAGNOSIS — J302 Other seasonal allergic rhinitis: Secondary | ICD-10-CM | POA: Diagnosis not present

## 2021-03-02 DIAGNOSIS — H9190 Unspecified hearing loss, unspecified ear: Secondary | ICD-10-CM

## 2021-03-02 DIAGNOSIS — N183 Chronic kidney disease, stage 3 unspecified: Secondary | ICD-10-CM | POA: Diagnosis not present

## 2021-03-02 DIAGNOSIS — E1129 Type 2 diabetes mellitus with other diabetic kidney complication: Secondary | ICD-10-CM

## 2021-03-02 DIAGNOSIS — E785 Hyperlipidemia, unspecified: Secondary | ICD-10-CM

## 2021-03-02 DIAGNOSIS — K219 Gastro-esophageal reflux disease without esophagitis: Secondary | ICD-10-CM | POA: Diagnosis not present

## 2021-03-02 MED ORDER — ATORVASTATIN CALCIUM 40 MG PO TABS: 40.0000 mg | ORAL_TABLET | Freq: Every day | ORAL | 2 refills | Status: DC

## 2021-03-02 MED ORDER — LOSARTAN POTASSIUM-HCTZ 100-25 MG PO TABS: 1.0000 | ORAL_TABLET | Freq: Every day | ORAL | 1 refills | Status: DC

## 2021-03-02 MED ORDER — CETIRIZINE HCL 10 MG PO TABS: 10.0000 mg | ORAL_TABLET | Freq: Every day | ORAL | 1 refills | Status: DC | PRN

## 2021-03-02 MED ORDER — OMEPRAZOLE 40 MG PO CPDR: DELAYED_RELEASE_CAPSULE | ORAL | 3 refills | Status: DC

## 2021-03-02 MED ORDER — EMPAGLIFLOZIN 10 MG PO TABS: 10.0000 mg | ORAL_TABLET | Freq: Every day | ORAL | 1 refills | Status: DC

## 2021-03-02 MED ORDER — METFORMIN HCL 1000 MG PO TABS: ORAL_TABLET | ORAL | 1 refills | Status: DC

## 2021-03-02 MED ORDER — SITAGLIPTIN PHOSPHATE 100 MG PO TABS: ORAL_TABLET | ORAL | 5 refills | Status: DC

## 2021-03-02 NOTE — Progress Notes (Signed)
° °  CC: 1 month diabetes follow up  HPI:  [name] is a 66 y.o. female with deafness (ASL interpretor used today, diabetes, HTN, and HLD who presents to the [facility] for a 1 month follow up of her diabetes and HTN. Please see problem-based list for further details, assessments, and plans.   Past Medical History:  Diagnosis Date   Back pain    Cardiac murmur    small membranous VSD with fairly loud cardiac murmur (asymptomatic)    Chronic PID (chronic pelvic inflammatory disease)    Deaf    Since age 48   Diabetes mellitus    History of Doppler ultrasound 06/20/09   Normal, no prior studies for comparison.    Hypertension    Mute    Obese    URI (upper respiratory infection)    Ventricular septal defect 2004 per echo   small membranous VSD   Review of Systems:  Review of Systems  Constitutional:  Negative for chills and fever.  Respiratory:  Negative for shortness of breath.   Cardiovascular:  Negative for chest pain and palpitations.  Gastrointestinal:  Negative for diarrhea, nausea and vomiting.  Genitourinary:  Negative for frequency and urgency.  Neurological:  Negative for dizziness and headaches.    Physical Exam:  Vitals:   03/02/21 1025  BP: (!) 158/91  Pulse: (!) 101  Resp: (!) 24  Temp: 98 F (36.7 C)  TempSrc: Oral  SpO2: 98%  Weight: 193 lb (87.5 kg)  Height: 5\' 7"  (1.702 m)   General: Appears stated age. No acute distress. CV: RRR. No murmurs. Pulmonary: Lungs CTAB. Normal effort.  Abdominal: Soft, nontender, nondistended.  Skin: Warm and dry. No obvious rash or lesions. Neuro: A&Ox3. No focal deficit. Psych: Normal mood and affect   Assessment & Plan:   See Encounters Tab for problem based charting.  Patient discussed with Dr. Dareen Piano

## 2021-03-02 NOTE — Patient Instructions (Signed)
Thank you, Janice Massey for allowing [place] to provide your care today. Today we discussed:  High blood pressure: STOP taking diltiazem (you left that medication here with [place]). Stop taking losartan and hydrochlorothiazide separtaely and start taking them together in the combination pill, called hyzaar. Take Hyzaar once a day. We will recheck your blood pressure in a few weeks and may need to start another medicine at that time.   Diabetes: keep taking metformin twice a day and keep taking [place] once a day. Please also start empagliflozin/jardiance 10 mg once a day. If this medicine is too expensive, please call us so we can send in a different medicine.   I have ordered the following labs for you:  Lab Orders  No laboratory test(s) ordered today     Tests ordered today:  none  Referrals ordered today:    Referral Orders         Referral to Nutrition and Diabetes Services      I have ordered the following medication/changed the following medications:   Stop the following medications: Medications Discontinued During This Encounter  Medication Reason   ondansetron (ZOFRAN) 4 MG tablet    chlorthalidone (HYGROTON) 25 MG tablet    canagliflozin (INVOKANA) 300 MG TABS tablet    diltiazem (CARDIZEM CD) 120 MG 24 hr capsule    losartan (COZAAR) 100 MG tablet Change in therapy   omeprazole (PRILOSEC) 40 MG capsule Reorder   cetirizine (ZYRTEC) 10 MG tablet Reorder   sitaGLIPtin (JANUVIA) 100 MG tablet Reorder   metFORMIN (GLUCOPHAGE) 1000 MG tablet Reorder   atorvastatin (LIPITOR) 40 MG tablet Reorder     Start the following medications: Meds ordered this encounter  Medications   sitaGLIPtin (JANUVIA) 100 MG tablet    Sig: TAKE 1 TABLET(100 MG) BY MOUTH DAILY    Dispense:  30 tablet    Refill:  5   omeprazole (PRILOSEC) 40 MG capsule    Sig: TAKE 1 CAPSULE(40 MG) BY MOUTH DAILY    Dispense:  30 capsule    Refill:  3   losartan-hydrochlorothiazide (HYZAAR) 100-25 MG tablet     Sig: Take 1 tablet by mouth daily.    Dispense:  90 tablet    Refill:  1   empagliflozin (JARDIANCE) 10 MG TABS tablet    Sig: Take 1 tablet (10 mg total) by mouth daily before breakfast.    Dispense:  30 tablet    Refill:  1   metFORMIN (GLUCOPHAGE) 1000 MG tablet    Sig: TAKE 1 TABLET(1000 MG) BY MOUTH TWICE DAILY    Dispense:  180 tablet    Refill:  1   cetirizine (ZYRTEC) 10 MG tablet    Sig: Take 1 tablet (10 mg total) by mouth daily as needed for allergies.    Dispense:  90 tablet    Refill:  1   atorvastatin (LIPITOR) 40 MG tablet    Sig: Take 1 tablet (40 mg total) by mouth daily.    Dispense:  30 tablet    Refill:  2     Follow up:  1 month     Remember: To start taking hyzaar for your blood pressure and to start taking jardiance for your diabetes  Should you have any questions or concerns please call the internal medicine clinic at [phone].     Buddy Duty, D.O. [place]

## 2021-03-02 NOTE — Assessment & Plan Note (Signed)
BP elevated today to 158/91, and repeat BP 179/110. Patient denies any headache, blurry vision, chest pain, palpitations, shortness of breath or any other symptoms at this time. She has been taking hctz 25 mg daily, losartan 100 mg daily, and diltiazem 120 mg daily. The patient has been out of losartan for about 4 days. Will discontinue diltiazem today, as she has no other indication for this medication aside from hypertension and will also start losartan-hctz combo pill 100-25 mg. Will recheck BP in ~4 weeks once she is on her medications and will recheck BMP at that time. Likely will need to start amlodipine or another BP medication, however, will hold off today since her BP may be improved once she is back on her losartan.  Plan: - Stop diltiazem - Start combo losartan-hctz 100-25 mg daily - BMP at next visit in 1 month, likely will need another BP med

## 2021-03-02 NOTE — Assessment & Plan Note (Signed)
A1c at her last visit 1 month ago was 12.4. Patient has been taking metformin 1 g bid and januvia 100 mg daily for her diabetes. She was previously prescribed canagliflozin, however, her insurance no longer covers this medication. The patient states that she has been reducing the amount of soda and juices she drinks and that she has been working on improving her diet overall.   Plan: - Continue metformin 1 g bid - Continue januvia 100 mg daily  - Start empagliflozin/jardiance 10 mg daily (call if too expensive, but should be covered by insurance) - A1c in 2 months - Referral to nutrition and diabetes services

## 2021-03-03 NOTE — Progress Notes (Signed)
Internal Medicine Clinic Attending ° °Case discussed with Dr. Atway  At the time of the visit.  We reviewed the resident’s history and exam and pertinent patient test results.  I agree with the assessment, diagnosis, and plan of care documented in the resident’s note.  °

## 2021-03-04 ENCOUNTER — Telehealth: Payer: Self-pay

## 2021-03-04 NOTE — Telephone Encounter (Signed)
DECISION : ? ? ? ? ? ?Out come ?Approved today ? ? ?PA Case: [id number], Status: Approved, ? ? ? Coverage Starts on: 03/04/2021 12:00:00 AM, Coverage Ends on: 03/04/2022 12:00:00 AM. ? ? ?Drug ? ?Jardiance 10MG  tablets ? ?Form ?[employer] Electronic [place] Form [phone] NCPDP) ? ? ? ( [place] )  ?

## 2021-03-04 NOTE — Telephone Encounter (Signed)
Pa for pt ( JARDIANCE 10 MG TAB ) came through via fax  from pharmacy  was submitted with office notes and labs ... awaiting approval or denial  ?

## 2021-03-06 DIAGNOSIS — M25511 Pain in right shoulder: Secondary | ICD-10-CM | POA: Diagnosis not present

## 2021-03-06 DIAGNOSIS — M545 Low back pain, unspecified: Secondary | ICD-10-CM | POA: Diagnosis not present

## 2021-03-06 DIAGNOSIS — S46011D Strain of muscle(s) and tendon(s) of the rotator cuff of right shoulder, subsequent encounter: Secondary | ICD-10-CM | POA: Diagnosis not present

## 2021-03-06 DIAGNOSIS — M25512 Pain in left shoulder: Secondary | ICD-10-CM | POA: Diagnosis not present

## 2021-03-09 ENCOUNTER — Ambulatory Visit: Payer: Self-pay

## 2021-03-16 ENCOUNTER — Ambulatory Visit: Payer: Self-pay

## 2021-03-30 ENCOUNTER — Encounter: Payer: Medicaid Other | Admitting: Internal Medicine

## 2021-04-08 ENCOUNTER — Ambulatory Visit: Payer: Medicare Other | Admitting: Internal Medicine

## 2021-04-08 ENCOUNTER — Other Ambulatory Visit: Payer: Self-pay

## 2021-04-08 ENCOUNTER — Encounter: Payer: Self-pay | Admitting: Internal Medicine

## 2021-04-08 VITALS — BP 143/76 | HR 108 | Temp 98.7°F | Ht 67.0 in | Wt 181.2 lb

## 2021-04-08 DIAGNOSIS — E1129 Type 2 diabetes mellitus with other diabetic kidney complication: Secondary | ICD-10-CM | POA: Diagnosis not present

## 2021-04-08 DIAGNOSIS — R809 Proteinuria, unspecified: Secondary | ICD-10-CM | POA: Diagnosis not present

## 2021-04-08 DIAGNOSIS — I10 Essential (primary) hypertension: Secondary | ICD-10-CM

## 2021-04-08 DIAGNOSIS — K219 Gastro-esophageal reflux disease without esophagitis: Secondary | ICD-10-CM

## 2021-04-08 DIAGNOSIS — Z0000 Encounter for general adult medical examination without abnormal findings: Secondary | ICD-10-CM

## 2021-04-08 LAB — POCT GLYCOSYLATED HEMOGLOBIN (HGB A1C): Hemoglobin A1C: 11.5 % — AB (ref 4.0–5.6)

## 2021-04-08 LAB — GLUCOSE, CAPILLARY: Glucose-Capillary: 284 mg/dL — ABNORMAL HIGH (ref 70–99)

## 2021-04-08 MED ORDER — EMPAGLIFLOZIN 10 MG PO TABS: 10.0000 mg | ORAL_TABLET | Freq: Every day | ORAL | 0 refills | Status: DC

## 2021-04-08 MED ORDER — OMEPRAZOLE 40 MG PO CPDR: DELAYED_RELEASE_CAPSULE | ORAL | 2 refills | Status: DC

## 2021-04-08 MED ORDER — LOSARTAN POTASSIUM-HCTZ 100-25 MG PO TABS: 1.0000 | ORAL_TABLET | Freq: Every day | ORAL | 1 refills | Status: DC

## 2021-04-08 MED ORDER — EMPAGLIFLOZIN 25 MG PO TABS: 25.0000 mg | ORAL_TABLET | Freq: Every day | ORAL | 2 refills | Status: DC

## 2021-04-08 MED ORDER — AMLODIPINE BESYLATE 10 MG PO TABS: 10.0000 mg | ORAL_TABLET | Freq: Every day | ORAL | 3 refills | Status: DC

## 2021-04-08 NOTE — Assessment & Plan Note (Addendum)
BP Readings from Last 3 Encounters:  ?04/08/21 (!) 143/76  ?03/02/21 (!) 179/110  ?01/16/21 (!) 141/81  ? ?Patient is presenting for follow up of her hypertension. At her last visit, patient was started on losartan-HCTZ and advised to discontinue diltiazem as this does not have many antihypertensive benefits. However, patient has continued to take the diltiazem. She denies any side effects. She denies any headaches, vision changes, lightheadedness/dizziness, or weakness.  ?Given that her blood pressure remains above goal, will switch to amlodipine.  ? ?Plan:  ?Continue losartan-HCTZ 100-'25mg'$  daily  ?Start amlodipine '10mg'$  daily ?BMP at this visit ?Advised to monitor BP at home and follow up in 4 weeks for BP check and medication adjustment  ? ?ADDENDUM: ?Renal function stable at this visit. No electrolyte abnormalities but did have elevated anion gap. Will follow up at next visit.  ?

## 2021-04-08 NOTE — Progress Notes (Signed)
? ?  CC: diabetes and hypertension follow up ? ?HPI: ? ?[name] is a 66 y.o. female with PMHx as stated below presenting for follow up of her diabetes and hypertension. She does not have any acute concerns today. Please see problem based charting for complete assessment and plan. ?Interpreter services utilized for : Sign Language ? ?Past Medical History:  ?Diagnosis Date  ? Back pain   ? Cardiac murmur   ? small membranous VSD with fairly loud cardiac murmur (asymptomatic)   ? Chronic PID (chronic pelvic inflammatory disease)   ? Deaf   ? Since age 86  ? Diabetes mellitus   ? History of Doppler ultrasound 06/20/09  ? Normal, no prior studies for comparison.   ? Hypertension   ? Mute   ? Obese   ? URI (upper respiratory infection)   ? Ventricular septal defect 2004 per echo  ? small membranous VSD  ? ?Review of Systems:  Negative except as stated in HPI. ? ?Physical Exam: ? ?Vitals:  ? 04/08/21 1100  ?BP: (!) 143/76  ?Pulse: (!) 108  ?Temp: 98.7 ?F (37.1 ?C)  ?TempSrc: Oral  ?SpO2: 99%  ?Weight: 181 lb 3.2 oz (82.2 kg)  ?Height: '5\' 7"'$  (1.702 m)  ? ?Physical Exam  ?Constitutional: Appears well-developed and well-nourished. No distress.  ?Cardiovascular: Normal rate, regular rhythm, S1 and S2 present, no murmurs, rubs, gallops.  Distal pulses intact ?Respiratory: No respiratory distress, Lungs are clear to auscultation bilaterally. ?Musculoskeletal: Normal bulk and tone.  No peripheral edema noted. ?Neurological: Is alert and oriented x4, no apparent focal deficits noted. ?Skin: Warm and dry.  No rash, erythema, lesions noted. ?Psychiatric: Normal mood and affect.  ? ?Assessment & Plan:  ? ?See Encounters Tab for problem based charting. ? ?Patient discussed with Dr. Heber  ? ?

## 2021-04-08 NOTE — Patient Instructions (Addendum)
Ms Janice Massey, ? ?It was a pleasure seeing you in clinic. Today we discussed:  ? ?Diabetes: ?Your A1c remains elevated at 11.5 today. As we discussed, continue to take your Januvia and Metformin as prescribed. If you are only able to tolerate Metformin once daily due to GI side effects, that is okay. ?START taking Jardiance '10mg'$  daily. If you are able to tolerate this for 4 weeks; you may increase to Jardiance '25mg'$  daily.  ?As we discussed, monitor for any yeast infections.  ?Follow up in 3 months for A1c check.  ? ?Blood pressure:  ?At this time, continue losartan-HCTZ daily ?START amlodipine '10mg'$  daily.  ?I am checking some lab work. I will call you with any abnormal results.  ?Follow up in 4 weeks for BP check  ? ?Healthcare maintenance: ?Mammogram and DEXA ordered. Please schedule at your earliest convenience  ? ?If you have any questions or concerns, please call our clinic at [phone] between 9am-5pm and after hours call [phone] and ask for the internal medicine resident on call. If you feel you are having a medical emergency please call 911.  ? ?Thank you, we look forward to helping you remain healthy! ? ? ?

## 2021-04-09 NOTE — Assessment & Plan Note (Signed)
This is stable. Patient requesting refill on omeprazole. ?

## 2021-04-09 NOTE — Assessment & Plan Note (Signed)
HbA1c remains elevated 12.4>11.5 at this visit. Patient has been on metformin 1g bid and Januvia '100mg'$  daily. She was previously started on jardiance '10mg'$  daily; however, she has not been able to pick this up from her pharmacy. Advised that prior auth was approved by her insurance and for her to pick up and take jardiance as prescribed. ? ?Plan: ?Continue metformin and januvia ?Start jardiance '10mg'$  daily; advised that if tolerating for one month, increase to '25mg'$  daily  ?Follow up in 3 months for A1c check - if no significant improvement in A1c, will need to consider switching Januvia to GLP-1 vs starting insulin ? ?

## 2021-04-09 NOTE — Assessment & Plan Note (Signed)
DEXA scan and mammogram ordered  ?

## 2021-04-09 NOTE — Progress Notes (Signed)
Internal Medicine Clinic Attending ° °Case discussed with Dr. [name]  At the time of the visit.  We reviewed the resident’s history and exam and pertinent patient test results.  I agree with the assessment, diagnosis, and plan of care documented in the resident’s note.  °

## 2021-04-10 LAB — BMP8+ANION GAP
Anion Gap: 20 mmol/L — ABNORMAL HIGH (ref 10.0–18.0)
BUN/Creatinine Ratio: 11 — ABNORMAL LOW (ref 12–28)
BUN: 11 mg/dL (ref 8–27)
CO2: 22 mmol/L (ref 20–29)
Calcium: 9.6 mg/dL (ref 8.7–10.3)
Chloride: 98 mmol/L (ref 96–106)
Creatinine, Ser: 1 mg/dL (ref 0.57–1.00)
Glucose: 272 mg/dL — ABNORMAL HIGH (ref 70–99)
Potassium: 3.7 mmol/L (ref 3.5–5.2)
Sodium: 140 mmol/L (ref 134–144)
eGFR: 63 mL/min/{1.73_m2} (ref >59)

## 2021-04-20 ENCOUNTER — Inpatient Hospital Stay: Admission: RE | Admit: 2021-04-20 | Payer: Self-pay | Source: Ambulatory Visit

## 2021-04-22 ENCOUNTER — Ambulatory Visit: Payer: Medicaid Other

## 2021-05-01 ENCOUNTER — Emergency Department (HOSPITAL_COMMUNITY): Payer: Medicare Other

## 2021-05-01 ENCOUNTER — Other Ambulatory Visit: Payer: Self-pay

## 2021-05-01 ENCOUNTER — Ambulatory Visit (HOSPITAL_COMMUNITY)
Admission: EM | Admit: 2021-05-01 | Discharge: 2021-05-01 | Disposition: A | Discharge status: Home / Self Care | Payer: Medicaid Other | Attending: Internal Medicine | Admitting: Internal Medicine

## 2021-05-01 ENCOUNTER — Encounter (HOSPITAL_COMMUNITY): Payer: Self-pay | Admitting: Emergency Medicine

## 2021-05-01 ENCOUNTER — Encounter (HOSPITAL_COMMUNITY): Payer: Self-pay

## 2021-05-01 ENCOUNTER — Observation Stay (HOSPITAL_COMMUNITY)
Admission: EM | Admit: 2021-05-01 | Discharge: 2021-05-03 | Disposition: A | Discharge status: Home / Self Care | Payer: Medicare Other | Attending: Internal Medicine | Admitting: Internal Medicine

## 2021-05-01 ENCOUNTER — Observation Stay (HOSPITAL_COMMUNITY): Payer: Medicare Other

## 2021-05-01 DIAGNOSIS — K8050 Calculus of bile duct without cholangitis or cholecystitis without obstruction: Secondary | ICD-10-CM | POA: Diagnosis present

## 2021-05-01 DIAGNOSIS — E785 Hyperlipidemia, unspecified: Secondary | ICD-10-CM | POA: Diagnosis not present

## 2021-05-01 DIAGNOSIS — Z79899 Other long term (current) drug therapy: Secondary | ICD-10-CM | POA: Insufficient documentation

## 2021-05-01 DIAGNOSIS — Z7982 Long term (current) use of aspirin: Secondary | ICD-10-CM | POA: Diagnosis not present

## 2021-05-01 DIAGNOSIS — R1011 Right upper quadrant pain: Principal | ICD-10-CM | POA: Insufficient documentation

## 2021-05-01 DIAGNOSIS — E119 Type 2 diabetes mellitus without complications: Secondary | ICD-10-CM | POA: Insufficient documentation

## 2021-05-01 DIAGNOSIS — R42 Dizziness and giddiness: Secondary | ICD-10-CM | POA: Diagnosis not present

## 2021-05-01 DIAGNOSIS — Z7984 Long term (current) use of oral hypoglycemic drugs: Secondary | ICD-10-CM | POA: Insufficient documentation

## 2021-05-01 DIAGNOSIS — J45909 Unspecified asthma, uncomplicated: Secondary | ICD-10-CM | POA: Insufficient documentation

## 2021-05-01 DIAGNOSIS — I10 Essential (primary) hypertension: Secondary | ICD-10-CM | POA: Insufficient documentation

## 2021-05-01 DIAGNOSIS — Z9049 Acquired absence of other specified parts of digestive tract: Secondary | ICD-10-CM | POA: Diagnosis not present

## 2021-05-01 DIAGNOSIS — K760 Fatty (change of) liver, not elsewhere classified: Secondary | ICD-10-CM | POA: Diagnosis not present

## 2021-05-01 DIAGNOSIS — K5900 Constipation, unspecified: Secondary | ICD-10-CM | POA: Insufficient documentation

## 2021-05-01 DIAGNOSIS — R109 Unspecified abdominal pain: Secondary | ICD-10-CM

## 2021-05-01 LAB — COMPREHENSIVE METABOLIC PANEL
ALT: 28 U/L (ref 0–44)
AST: 21 U/L (ref 15–41)
Albumin: 4 g/dL (ref 3.5–5.0)
Alkaline Phosphatase: 64 U/L (ref 38–126)
Anion gap: 11 (ref 5–15)
BUN: 14 mg/dL (ref 8–23)
CO2: 26 mmol/L (ref 22–32)
Calcium: 9.9 mg/dL (ref 8.9–10.3)
Chloride: 102 mmol/L (ref 98–111)
Creatinine, Ser: 0.88 mg/dL (ref 0.44–1.00)
GFR, Estimated: >60 mL/min (ref >60)
Glucose, Bld: 204 mg/dL — ABNORMAL HIGH (ref 70–99)
Potassium: 3.3 mmol/L — ABNORMAL LOW (ref 3.5–5.1)
Sodium: 139 mmol/L (ref 135–145)
Total Bilirubin: 0.8 mg/dL (ref 0.3–1.2)
Total Protein: 7.4 g/dL (ref 6.5–8.1)

## 2021-05-01 LAB — CBC
HCT: 43.6 % (ref 36.0–46.0)
Hemoglobin: 14.1 g/dL (ref 12.0–15.0)
MCH: 28.9 pg (ref 26.0–34.0)
MCHC: 32.3 g/dL (ref 30.0–36.0)
MCV: 89.3 fL (ref 80.0–100.0)
Platelets: 323 10*3/uL (ref 150–400)
RBC: 4.88 MIL/uL (ref 3.87–5.11)
RDW: 13.2 % (ref 11.5–15.5)
WBC: 9.5 10*3/uL (ref 4.0–10.5)
nRBC: 0 % (ref 0.0–0.2)

## 2021-05-01 LAB — LIPASE, BLOOD: Lipase: 35 U/L (ref 11–51)

## 2021-05-01 LAB — URINALYSIS, ROUTINE W REFLEX MICROSCOPIC
Bilirubin Urine: NEGATIVE
Glucose, UA: >=500 mg/dL — AB
Hgb urine dipstick: NEGATIVE
Ketones, ur: NEGATIVE mg/dL
Leukocytes,Ua: NEGATIVE
Nitrite: NEGATIVE
Protein, ur: 30 mg/dL — AB
Specific Gravity, Urine: 1.039 — ABNORMAL HIGH (ref 1.005–1.030)
pH: 5 (ref 5.0–8.0)

## 2021-05-01 MED ORDER — SENNOSIDES-DOCUSATE SODIUM 8.6-50 MG PO TABS: 1.0000 | ORAL_TABLET | Freq: Two times a day (BID) | ORAL | Status: DC

## 2021-05-01 MED ORDER — FLUTICASONE PROPIONATE 50 MCG/ACT NA SUSP: 1.0000 | Freq: Every day | NASAL | Status: DC | PRN

## 2021-05-01 MED ORDER — METFORMIN HCL 500 MG PO TABS: 1000.0000 mg | ORAL_TABLET | Freq: Two times a day (BID) | ORAL | Status: DC

## 2021-05-01 MED ORDER — EMPAGLIFLOZIN 10 MG PO TABS
10.0000 mg | ORAL_TABLET | Freq: Every day | ORAL | Status: DC
Start: 2021-05-02 — End: 2021-05-02
  Filled 2021-05-01: qty 1

## 2021-05-01 MED ORDER — MORPHINE SULFATE (PF) 4 MG/ML IV SOLN
4.0000 mg | Freq: Once | INTRAVENOUS | Status: AC
  Administered 2021-05-01: 4 mg via INTRAVENOUS
  Filled 2021-05-01: qty 1

## 2021-05-01 MED ORDER — LINAGLIPTIN 5 MG PO TABS
5.0000 mg | ORAL_TABLET | Freq: Every day | ORAL | Status: DC
Start: 2021-05-02 — End: 2021-05-04
  Administered 2021-05-02 – 2021-05-03 (×2): 5 mg via ORAL
  Filled 2021-05-01 (×2): qty 1

## 2021-05-01 MED ORDER — ONDANSETRON HCL 4 MG/2ML IJ SOLN
4.0000 mg | Freq: Once | INTRAMUSCULAR | Status: AC
Start: 2021-05-01 — End: 2021-05-01
  Administered 2021-05-01: 4 mg via INTRAVENOUS
  Filled 2021-05-01: qty 2

## 2021-05-01 MED ORDER — ATORVASTATIN CALCIUM 40 MG PO TABS
40.0000 mg | ORAL_TABLET | Freq: Every day | ORAL | Status: DC
  Administered 2021-05-02 (×2): 40 mg via ORAL
  Filled 2021-05-01 (×2): qty 1

## 2021-05-01 MED ORDER — KETOROLAC TROMETHAMINE 15 MG/ML IJ SOLN
15.0000 mg | Freq: Once | INTRAMUSCULAR | Status: AC
Start: 2021-05-01 — End: 2021-05-01
  Administered 2021-05-01: 15 mg via INTRAVENOUS
  Filled 2021-05-01: qty 1

## 2021-05-01 MED ORDER — INSULIN ASPART 100 UNIT/ML IJ SOLN: 0.0000 [IU] | Freq: Three times a day (TID) | INTRAMUSCULAR | Status: DC

## 2021-05-01 MED ORDER — ENOXAPARIN SODIUM 40 MG/0.4ML IJ SOSY: 40.0000 mg | PREFILLED_SYRINGE | INTRAMUSCULAR | Status: DC

## 2021-05-01 MED ORDER — ONDANSETRON HCL 4 MG/2ML IJ SOLN
4.0000 mg | Freq: Once | INTRAMUSCULAR | Status: AC
  Administered 2021-05-01: 4 mg via INTRAVENOUS
  Filled 2021-05-01: qty 2

## 2021-05-01 MED ORDER — ASPIRIN 81 MG PO CHEW
81.0000 mg | CHEWABLE_TABLET | Freq: Every day | ORAL | Status: DC
  Administered 2021-05-02 – 2021-05-03 (×2): 81 mg via ORAL
  Filled 2021-05-01 (×2): qty 1

## 2021-05-01 MED ORDER — ONDANSETRON HCL 4 MG/2ML IJ SOLN
4.0000 mg | Freq: Four times a day (QID) | INTRAMUSCULAR | Status: DC | PRN
  Administered 2021-05-02: 4 mg via INTRAVENOUS
  Filled 2021-05-01: qty 2

## 2021-05-01 MED ORDER — ACETAMINOPHEN 650 MG RE SUPP: 650.0000 mg | Freq: Four times a day (QID) | RECTAL | Status: DC | PRN

## 2021-05-01 MED ORDER — INSULIN ASPART 100 UNIT/ML IJ SOLN: 0.0000 [IU] | Freq: Every day | INTRAMUSCULAR | Status: DC

## 2021-05-01 MED ORDER — PANTOPRAZOLE SODIUM 40 MG PO TBEC
40.0000 mg | DELAYED_RELEASE_TABLET | Freq: Every day | ORAL | Status: DC
Start: 2021-05-02 — End: 2021-05-04
  Administered 2021-05-02 – 2021-05-03 (×2): 40 mg via ORAL
  Filled 2021-05-01 (×2): qty 1

## 2021-05-01 MED ORDER — IOHEXOL 300 MG/ML  SOLN
100.0000 mL | Freq: Once | INTRAMUSCULAR | Status: AC | PRN
  Administered 2021-05-01: 100 mL via INTRAVENOUS

## 2021-05-01 MED ORDER — ACETAMINOPHEN 325 MG PO TABS: 650.0000 mg | ORAL_TABLET | Freq: Four times a day (QID) | ORAL | Status: DC | PRN

## 2021-05-01 MED ORDER — HYDROMORPHONE HCL 1 MG/ML IJ SOLN: 0.5000 mg | INTRAMUSCULAR | Status: DC | PRN

## 2021-05-01 MED ORDER — ONDANSETRON HCL 4 MG PO TABS: 4.0000 mg | ORAL_TABLET | Freq: Four times a day (QID) | ORAL | Status: DC | PRN

## 2021-05-01 MED ORDER — METFORMIN HCL 500 MG PO TABS
1000.0000 mg | ORAL_TABLET | Freq: Every day | ORAL | Status: DC
Start: 2021-05-02 — End: 2021-05-01

## 2021-05-01 MED ORDER — AMLODIPINE BESYLATE 10 MG PO TABS
10.0000 mg | ORAL_TABLET | Freq: Every day | ORAL | Status: DC
  Administered 2021-05-02 – 2021-05-03 (×2): 10 mg via ORAL
  Filled 2021-05-01 (×2): qty 1

## 2021-05-01 NOTE — ED Notes (Signed)
Patient is being discharged from the Urgent Care and sent to the Emergency Department via POV . Per Dr.Lamptey, patient is in need of higher level of care due to abdominal pain. Patient is aware and verbalizes understanding of plan of care.  ?Vitals:  ? 05/01/21 1133  ?BP: 137/90  ?Pulse: 99  ?Resp: 16  ?Temp: 97.9 ?F (36.6 ?C)  ?SpO2: 96%  ?  ?

## 2021-05-01 NOTE — ED Provider Triage Note (Signed)
Emergency Medicine Provider Triage Evaluation Note ? ?Janice Massey , a 66 y.o. female  was evaluated in triage.  Pt complains of right flank pain.  Pain started about 2 weeks ago.  Pain has been constant and is gradually worsened.  Rates it 9 out of 10 in intensity.  She says she has associated dysuria.  She feels like she has had some subjective fevers at home as well as body aches.  Associated constipation.  No diarrhea, nausea, or vomiting. ? ?Review of Systems  ?Positive: Right flank pain, dysuria, chills, body aches ?Negative:  ? ?Physical Exam  ?BP (!) 145/88 (BP Location: Left Arm)   Pulse 99   Temp 98.7 ?F (37.1 ?C) (Oral)   Resp 14   Ht '5\' 7"'$  (1.702 m)   Wt 81.6 kg   SpO2 96%   BMI 28.19 kg/m?  ?Gen:   Awake, no distress   ?Resp:  Normal effort  ?MSK:   Moves extremities without difficulty  ?Other:  Reproducible right CVA tenderness and RLQ tenderness. No guarding or rigidity.  ? ?Medical Decision Making  ?Medically screening exam initiated at 12:27 PM.  Appropriate orders placed.  Amaryllis Malmquist was informed that the remainder of the evaluation will be completed by another provider, this initial triage assessment does not replace that evaluation, and the importance of remaining in the ED until their evaluation is complete. ? ? ?  ?Adolphus Birchwood, PA-C ?05/01/21 1229 ? ?

## 2021-05-01 NOTE — Discharge Instructions (Addendum)
Please go to the emergency department for further evaluation.

## 2021-05-01 NOTE — ED Triage Notes (Signed)
Pt arrived POV from home c/o right flank pain/back pain. Pt endorses difficulty with urination and burning. Pain is a 9/10.  ?

## 2021-05-01 NOTE — ED Provider Notes (Signed)
?[facility] ?Provider Note ? ?CSN: [id number] ?Arrival date & time: 05/01/21 1217 ? ?Chief Complaint(s) ?Flank Pain ? ?HPI ?Janice Massey is a 66 y.o. female who presents to the emergency department for evaluation of abdominal pain.  Patient states for the last 2 weeks she has had progressively worsening right upper quadrant abdominal pain.  She describes her pain is postprandial and associated with nausea.  She states that this pain feels very similar to her abdominal pain when she was found to have cholecystitis.  She had her gallbladder removed at that time and has been doing well up until the last 2 weeks.  She denies dysuria, increased frequency, headache, chest pain, shortness of breath or other systemic symptoms. ? ? ?Past Medical History ?Past Medical History:  ?Diagnosis Date  ? Back pain   ? Cardiac murmur   ? small membranous VSD with fairly loud cardiac murmur (asymptomatic)   ? Chronic PID (chronic pelvic inflammatory disease)   ? Deaf   ? Since age 43  ? Diabetes mellitus   ? History of Doppler ultrasound 06/20/09  ? Normal, no prior studies for comparison.   ? Hypertension   ? Mute   ? Obese   ? URI (upper respiratory infection)   ? Ventricular septal defect 2004 per echo  ? small membranous VSD  ? ?Patient Active Problem List  ? Diagnosis Date Noted  ? Depression screening 01/19/2021  ? Cervical stenosis (uterine cervix) 02/29/2020  ? Healthcare maintenance 01/30/2020  ? Breast lump on left side at 5 o'clock position 07/31/2019  ? 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 Post-menopausal bleeding 10/01/2013  ? VSD (ventricular septal defect), perimembranous 05/29/2013  ? Encounter for screening colonoscopy 11/09/2011  ? Essential hypertension 08/10/2011  ? Hyperlipidemia 10/11/2007  ? Hearing loss 11/10/2005  ? Asthma 11/10/2005  ? GERD 11/10/2005  ? Diabetes ([place]) 04/05/2002  ? ?Home Medication(s) ?Prior to Admission medications   ?Medication Sig Start Date  End Date Taking? Authorizing Provider  ?Accu-Chek FastClix Lancets MISC 1 Units by Does not apply route daily. Use once a daily in morning to check blood glucose 01/16/21   Iona Beard, MD  ?amLODipine (NORVASC) 10 MG tablet Take 1 tablet (10 mg total) by mouth daily. 04/08/21 04/08/22  Harvie Heck, MD  ?aspirin 81 MG chewable tablet Chew 1 tablet (81 mg total) by mouth daily. 11/18/20   Lajean Manes, MD  ?atorvastatin (LIPITOR) 40 MG tablet Take 1 tablet (40 mg total) by mouth daily. 03/02/21 05/31/21  Dorethea Clan, DO  ?Blood Glucose Monitoring Suppl (ACCU-CHEK GUIDE) w/Device KIT 1 Units by Does not apply route daily. 01/16/21   Iona Beard, MD  ?cetirizine (ZYRTEC) 10 MG tablet Take 1 tablet (10 mg total) by mouth daily as needed for allergies. 03/02/21   Atway, Jeananne Rama, DO  ?empagliflozin (JARDIANCE) 10 MG TABS tablet Take 1 tablet (10 mg total) by mouth daily before breakfast. 04/08/21   Harvie Heck, MD  ?empagliflozin (JARDIANCE) 25 MG TABS tablet Take 1 tablet (25 mg total) by mouth daily before breakfast. 05/08/21   Harvie Heck, MD  ?fluticasone (FLONASE) 50 MCG/ACT nasal spray SHAKE LIQUID AND USE 2 SPRAYS IN EACH NOSTRIL DAILY 11/18/20   Lajean Manes, MD  ?glucose blood (ACCU-CHEK GUIDE) test strip Use as instructed 01/16/21   Iona Beard, MD  ?latanoprost (XALATAN) 0.005 % ophthalmic solution INSTILL 1 DROP IN BOTH EYES EVERY DAY IN THE EVENING 10/06/20   Harvie Heck, MD  ?losartan-hydrochlorothiazide (  HYZAAR) 100-25 MG tablet Take 1 tablet by mouth daily. 04/08/21   Harvie Heck, MD  ?meloxicam (MOBIC) 15 MG tablet Take 1 tablet (15 mg total) by mouth daily. 07/17/20   Katsadouros, Vasilios, MD  ?metFORMIN (GLUCOPHAGE) 1000 MG tablet TAKE 1 TABLET(1000 MG) BY MOUTH TWICE DAILY 03/02/21   Atway, Rayann N, DO  ?omeprazole (PRILOSEC) 40 MG capsule TAKE 1 CAPSULE(40 MG) BY MOUTH DAILY 04/08/21   Harvie Heck, MD  ?sitaGLIPtin (JANUVIA) 100 MG tablet TAKE 1 TABLET(100 MG) BY MOUTH DAILY 03/02/21   Atway, Rayann  N, DO  ?                                                                                                                                  ?Past Surgical History ?Past Surgical History:  ?Procedure Laterality Date  ? CARDIAC CATHETERIZATION  08/23/95  ? CHOLECYSTECTOMY  11/04/1993  ? COLPOSCOPY    ? ENDOMETRIAL BIOPSY  02/29/2020  ?    ? TUBAL LIGATION  10/05/1978  ? ?Family History ?Family History  ?Problem Relation Age of Onset  ? Cancer Mother   ?     liver  ? Heart attack Mother 29  ? Hypertension Father   ? Diabetes Father   ? Heart attack Father   ? CAD Father 1  ? Diabetes Paternal Aunt   ? Diabetes Maternal Grandfather   ? Breast cancer Cousin   ? Colon cancer Neg Hx   ? Endometrial cancer Neg Hx   ? Pancreatic cancer Neg Hx   ? Prostate cancer Neg Hx   ? Ovarian cancer Neg Hx   ? ? ?Social History ?Social History  ? ?Tobacco Use  ? Smoking status: Never  ? Smokeless tobacco: Never  ?Vaping Use  ? Vaping Use: Never used  ?Substance Use Topics  ? Alcohol use: No  ?  Alcohol/week: 0.0 standard drinks  ? Drug use: No  ? ?Allergies ?Codeine, Lisinopril, and Penicillins ? ?Review of Systems ?Review of Systems  ?Gastrointestinal:  Positive for abdominal pain and nausea.  ? ?Physical Exam ?Vital Signs  ?I have reviewed the triage vital signs ?BP 136/76 (BP Location: Right Arm)   Pulse 96   Temp 98.6 ?F (37 ?C) (Oral)   Resp 16   Ht '5\' 7"'  (1.702 m)   Wt 81.6 kg   SpO2 93%   BMI 28.19 kg/m?  ? ?Physical Exam ?Vitals and nursing note reviewed.  ?Constitutional:   ?   General: She is not in acute distress. ?   Appearance: She is well-developed.  ?HENT:  ?   Head: Normocephalic and atraumatic.  ?Eyes:  ?   Conjunctiva/sclera: Conjunctivae normal.  ?Cardiovascular:  ?   Rate and Rhythm: Normal rate and regular rhythm.  ?   Heart sounds: No murmur heard. ?Pulmonary:  ?   Effort: Pulmonary effort is normal. No respiratory distress.  ?   Breath sounds: Normal breath sounds.  ?  Abdominal:  ?   Palpations: Abdomen is soft.   ?   Tenderness: There is abdominal tenderness (Right upper quadrant).  ?Musculoskeletal:     ?   General: No swelling.  ?   Cervical back: Neck supple.  ?Skin: ?   General: Skin is warm and dry.  ?   Capillary Refill: Capillary refill takes less than 2 seconds.  ?Neurological:  ?   Mental Status: She is alert.  ?Psychiatric:     ?   Mood and Affect: Mood normal.  ? ? ?ED Results and Treatments ?Labs ?(all labs ordered are listed, but only abnormal results are displayed) ?Labs Reviewed  ?COMPREHENSIVE METABOLIC PANEL - Abnormal; Notable for the following components:  ?    Result Value  ? Potassium 3.3 (*)   ? Glucose, Bld 204 (*)   ? All other components within normal limits  ?URINALYSIS, ROUTINE W REFLEX MICROSCOPIC - Abnormal; Notable for the following components:  ? Specific Gravity, Urine 1.039 (*)   ? Glucose, UA >=500 (*)   ? Protein, ur 30 (*)   ? Bacteria, UA RARE (*)   ? All other components within normal limits  ?URINE CULTURE  ?LIPASE, BLOOD  ?CBC  ?                                                                                                                       ? ?Radiology ?CT ABDOMEN PELVIS W CONTRAST ? ?Result Date: 05/01/2021 ?CLINICAL DATA:  Pain right lower quadrant EXAM: CT ABDOMEN AND PELVIS WITH CONTRAST TECHNIQUE: Multidetector CT imaging of the abdomen and pelvis was performed using the standard protocol following bolus administration of intravenous contrast. RADIATION DOSE REDUCTION: This exam was performed according to the departmental dose-optimization program which includes automated exposure control, adjustment of the mA and/or kV according to patient size and/or use of iterative reconstruction technique. CONTRAST:  119m OMNIPAQUE IOHEXOL 300 MG/ML  SOLN COMPARISON:  None. FINDINGS: Lower chest: Subtle increased markings are seen in the lower lung fields which may be due to breathing motion or suggest scarring or subsegmental atelectasis. There is no pleural effusion. Hepatobiliary:  There is fatty infiltration. Surgical clips are seen in gallbladder fossa. Pancreas: There is prominence of pancreatic duct. There is atrophy. No focal abnormality is seen. Spleen: Unremarkable. Adrenals/Urin

## 2021-05-01 NOTE — H&P (Addendum)
? ? ? ?Date: 05/01/2021     ?     ?     ?Patient Name:  Janice Massey MRN: [id number]  ?DOB: Nov 11, 1955 Age / Sex: 66 y.o., female   ?PCP: Timothy Lasso, MD    ?     ?Medical Service: Internal Medicine Teaching Service    ?     ?Attending Physician: Dr. Velna Ochs, MD    ?First Contact: Dr. Howie Ill Pager: [phone]  ?Second Contact: Dr. Collene Gobble Pager: [phone]  ?     ?After Hours (After 5p/  First Contact Pager: [phone]  ?weekends / holidays): Second Contact Pager: [phone]  ? ?Chief Complaint: postprandial RUQ abdominal pain, nausea, back pain ? ?History of Present Illness: Janice Massey is a 66 y.o. female with a PMHx of hypertension, diabetes, and a cholecystectomy ~10 years ago who presents to the ED today with chief complaint of postprandial RUQ abdominal pain, nausea, and back pain.  History was obtained with the assistance of an ASL interpreter. ? ?The patient states that she had a cholecystectomy about 10 years ago, but ever since then she has had a small degree of abdominal pain.  However, in the past 1-2 weeks, the abdominal pain became constant.  She describes it as sharp, radiating to the entirety of her back and to the RLQ.  The pain is exacerbated by eating all foods; when asked to elaborate on which foods make it worse, she states that she was unable to eat cereal this morning due to the pain.  She has not tried any pain medication at home and states that nothing particular alleviates the pain.  Endorses nausea but states she has been spitting up some white material.  Denies true emesis, however.  She states she has felt constipated (she has had only 2 bowel movements all week). Endorses urinary frequency and occasional dysuria. Denies chest pain or shortness of breath.  No other complaints or concerns today. ? ?Meds:  ?Current Meds  ?Medication Sig  ? amLODipine (NORVASC) 10 MG tablet Take 1 tablet (10 mg total) by mouth daily.  ? aspirin 81 MG chewable tablet Chew 1 tablet (81 mg total) by  mouth daily.  ? atorvastatin (LIPITOR) 40 MG tablet Take 1 tablet (40 mg total) by mouth daily. (Patient taking differently: Take 40 mg by mouth at bedtime.)  ? cetirizine (ZYRTEC) 10 MG tablet Take 1 tablet (10 mg total) by mouth daily as needed for allergies.  ? empagliflozin (JARDIANCE) 10 MG TABS tablet Take 1 tablet (10 mg total) by mouth daily before breakfast.  ? fluticasone (FLONASE) 50 MCG/ACT nasal spray SHAKE LIQUID AND USE 2 SPRAYS IN EACH NOSTRIL DAILY (Patient taking differently: Place 1 spray into both nostrils daily as needed for allergies.)  ? hydrochlorothiazide (HYDRODIURIL) 25 MG tablet Take 25 mg by mouth daily.  ? losartan-hydrochlorothiazide (HYZAAR) 100-25 MG tablet Take 1 tablet by mouth daily.  ? metFORMIN (GLUCOPHAGE) 1000 MG tablet TAKE 1 TABLET(1000 MG) BY MOUTH TWICE DAILY (Patient taking differently: Take 1,000 mg by mouth daily with breakfast.)  ? omeprazole (PRILOSEC) 40 MG capsule TAKE 1 CAPSULE(40 MG) BY MOUTH DAILY (Patient taking differently: Take 40 mg by mouth daily.)  ? ?Allergies: ?Allergies as of 05/01/2021 - Review Complete 05/01/2021  ?Allergen Reaction Noted  ? Codeine Itching   ? Lisinopril Cough 04/20/2013  ? Penicillins Itching   ? ?Past Medical History:  ?Diagnosis Date  ? Back pain   ? Cardiac murmur   ? small membranous VSD with  fairly loud cardiac murmur (asymptomatic)   ? Chronic PID (chronic pelvic inflammatory disease)   ? Deaf   ? Since age 64  ? Diabetes mellitus   ? History of Doppler ultrasound 06/20/09  ? Normal, no prior studies for comparison.   ? Hypertension   ? Mute   ? Obese   ? URI (upper respiratory infection)   ? Ventricular septal defect 2004 per echo  ? small membranous VSD  ? ?Family History: Father with diabetes and heart attack.  Mother passed away from liver cancer in 2003. ? ?Social History: Lives in the [place] area with her daughter. Denies tobacco, alcohol, or other drug use. ? ?Review of Systems: ?A complete ROS was negative except as  per HPI.  ? ?Physical Exam: ?Blood pressure (!) 143/88, pulse 100, temperature 98.6 ?F (37 ?C), temperature source Oral, resp. rate 16, height '5\' 7"'$  (1.702 m), weight 81.6 kg, SpO2 92 %. ?General: NAD, nl appearance ?HE: Normocephalic, atraumatic, EOMI, Conjunctivae normal ?ENT: No congestion, no rhinorrhea, no exudate or erythema  ?Cardiovascular: Normal rate, regular rhythm. I/VI holosystolic murmur at LUSB. No rubs or gallops ?Pulmonary: Effort normal, breath sounds normal. No wheezes, rales, or rhonchi ?Abdominal: Soft, nondistended, bowel sounds present; there is tenderness to palpation RUQ > RLQ.  Mild TTP to LUQ.  No tenderness to palpation to LLQ.  There is no rebound or guarding.  Significant CVA tenderness right > left. ?Musculoskeletal: no swelling, deformity, injury or tenderness in extremities ?Skin: Warm, dry, no bruising, erythema, or rash ?Psychiatric/Behavioral: normal mood, normal behavior    ? ?CT Abdomen/Pelvis W Contrast: ?There is no evidence of intestinal obstruction or pneumoperitoneum. ?There is no hydronephrosis. Appendix is not dilated. ? ?RUQ [place]: ?IMPRESSION: ?Hepatic steatosis. ?Status post cholecystectomy. ?  ? ?Assessment & Plan by Problem: ?Principal Problem: ?  Biliary pain ? ?Postprandial RUQ abdominal pain ?S/p cholecystectomy ~ 10 years ago ?The patient presents today 10 years after a cholecystectomy, now with a 1-2-week history of constant postprandial abdominal and back pain.  In the ED, she has been mildly hypertensive to systolics in the 846N, but she is afebrile and is otherwise hemodynamically stable. Work-up in the ED, including RUQ ultrasound and CT abdomen/pelvis, are unremarkable, however given the persistence of her pain, out team was consulted for admission with GI consultation. ?Differential diagnosis at this time includes pyelonephritis with her CVA tenderness on exam as well as urinary symptoms, though this is unlikely given that the patient is afebrile, negative  UA, and negative CT scan. Acute pancreatitis is on the differential given the presence of postprandial abdominal pain radiating to the back, however also unlikely because her lipase is WNL and imaging is unrevealing. Cholecystis is unlikely s/p cholecystectomy. Patient's presentation is likely 2/2 choledocholithiasis; will evaluate further with MRCP. Could also have a biliary injury, which can become symptomatic years after a cholecystectomy. ?-GI is on board, appreciate their recommendations ?-MRCP pending ?-Dilaudid 0.5 mg q4h PRN ?-Tylenol as needed ? ?Poorly-controlled type 2 diabetes ?Most recent A1c earlier this month of 11.5. Home regimen includes metformin 1 g twice daily, Jardiance 10 mg daily, and Januvia 100 mg daily at home. ?-Metformin 1000 mg BID ?-Tradjenta 5 mg p.o. daily in place of Januvia while in the hospital ?-Jardiance 10 mg p.o. daily ?-SSI with meals ?-Carb-modified diet ? ?Hypertension ?The patient has been mildly hypertensive in the ED, with systolics ranging from the 130s-140s in the ED. ?-Continue amlodipine 10 mg daily ?-Continue hyzaar ? ?HLD ?-Continue atorvastatin 40  mg daily ? ?Dispo: Admit patient to Observation with expected length of stay less than 2 midnights. ? ?Signed: ?Orvis Brill, MD ?05/01/2021, 11:37 PM  ?Pager: [phone]  ?After 5pm on weekdays and 1pm on weekends: On Call pager: [phone] ? ?

## 2021-05-01 NOTE — ED Triage Notes (Signed)
Pt reports right lower abdominal pain and back pain x 2 weeks. States have had loss of appetite, and difficulty using the bathroom. Last BM was today. States BM was hard and painful. ?

## 2021-05-01 NOTE — ED Notes (Signed)
Patient transported to Ultrasound 

## 2021-05-01 NOTE — ED Notes (Signed)
Patient transported to CT 

## 2021-05-02 DIAGNOSIS — K8050 Calculus of bile duct without cholangitis or cholecystitis without obstruction: Secondary | ICD-10-CM | POA: Diagnosis not present

## 2021-05-02 DIAGNOSIS — K760 Fatty (change of) liver, not elsewhere classified: Secondary | ICD-10-CM | POA: Diagnosis not present

## 2021-05-02 DIAGNOSIS — R1011 Right upper quadrant pain: Secondary | ICD-10-CM

## 2021-05-02 DIAGNOSIS — M47816 Spondylosis without myelopathy or radiculopathy, lumbar region: Secondary | ICD-10-CM | POA: Diagnosis not present

## 2021-05-02 DIAGNOSIS — K5900 Constipation, unspecified: Secondary | ICD-10-CM

## 2021-05-02 DIAGNOSIS — R935 Abnormal findings on diagnostic imaging of other abdominal regions, including retroperitoneum: Secondary | ICD-10-CM | POA: Diagnosis not present

## 2021-05-02 LAB — LIPID PANEL
Cholesterol: 163 mg/dL (ref 0–200)
HDL: 59 mg/dL (ref >40)
LDL Cholesterol: 87 mg/dL (ref 0–99)
Total CHOL/HDL Ratio: 2.8 RATIO
Triglycerides: 85 mg/dL (ref <150)
VLDL: 17 mg/dL (ref 0–40)

## 2021-05-02 LAB — BASIC METABOLIC PANEL
Anion gap: 11 (ref 5–15)
BUN: 17 mg/dL (ref 8–23)
CO2: 25 mmol/L (ref 22–32)
Calcium: 9.4 mg/dL (ref 8.9–10.3)
Chloride: 103 mmol/L (ref 98–111)
Creatinine, Ser: 0.83 mg/dL (ref 0.44–1.00)
GFR, Estimated: >60 mL/min (ref >60)
Glucose, Bld: 136 mg/dL — ABNORMAL HIGH (ref 70–99)
Potassium: 3.2 mmol/L — ABNORMAL LOW (ref 3.5–5.1)
Sodium: 139 mmol/L (ref 135–145)

## 2021-05-02 LAB — URINE CULTURE: Culture: NO GROWTH

## 2021-05-02 LAB — CBC
HCT: 40.1 % (ref 36.0–46.0)
Hemoglobin: 13.6 g/dL (ref 12.0–15.0)
MCH: 29.6 pg (ref 26.0–34.0)
MCHC: 33.9 g/dL (ref 30.0–36.0)
MCV: 87.2 fL (ref 80.0–100.0)
Platelets: 297 10*3/uL (ref 150–400)
RBC: 4.6 MIL/uL (ref 3.87–5.11)
RDW: 13.2 % (ref 11.5–15.5)
WBC: 11.8 10*3/uL — ABNORMAL HIGH (ref 4.0–10.5)
nRBC: 0 % (ref 0.0–0.2)

## 2021-05-02 LAB — GLUCOSE, CAPILLARY
Glucose-Capillary: 131 mg/dL — ABNORMAL HIGH (ref 70–99)
Glucose-Capillary: 94 mg/dL (ref 70–99)
Glucose-Capillary: 133 mg/dL — ABNORMAL HIGH (ref 70–99)

## 2021-05-02 LAB — CBG MONITORING, ED
Glucose-Capillary: 214 mg/dL — ABNORMAL HIGH (ref 70–99)
Glucose-Capillary: 137 mg/dL — ABNORMAL HIGH (ref 70–99)

## 2021-05-02 LAB — LACTIC ACID, PLASMA: Lactic Acid, Venous: 1.5 mmol/L (ref 0.5–1.9)

## 2021-05-02 LAB — D-DIMER, QUANTITATIVE: D-Dimer, Quant: 0.71 ug/mL-FEU — ABNORMAL HIGH (ref 0.00–0.50)

## 2021-05-02 LAB — HIV ANTIBODY (ROUTINE TESTING W REFLEX): HIV Screen 4th Generation wRfx: NONREACTIVE

## 2021-05-02 LAB — TSH: TSH: 3.047 u[IU]/mL (ref 0.350–4.500)

## 2021-05-02 LAB — AMYLASE: Amylase: 36 U/L (ref 28–100)

## 2021-05-02 MED ORDER — LOSARTAN POTASSIUM 50 MG PO TABS
100.0000 mg | ORAL_TABLET | Freq: Every day | ORAL | Status: DC
  Administered 2021-05-02 – 2021-05-03 (×2): 100 mg via ORAL
  Filled 2021-05-02 (×2): qty 2

## 2021-05-02 MED ORDER — POLYETHYLENE GLYCOL 3350 17 G PO PACK
17.0000 g | PACK | Freq: Every day | ORAL | Status: DC | PRN
  Administered 2021-05-02: 17 g via ORAL
  Filled 2021-05-02: qty 1

## 2021-05-02 MED ORDER — POTASSIUM CHLORIDE CRYS ER 20 MEQ PO TBCR
40.0000 meq | EXTENDED_RELEASE_TABLET | Freq: Once | ORAL | Status: AC
Start: 2021-05-02 — End: 2021-05-02
  Administered 2021-05-02: 40 meq via ORAL
  Filled 2021-05-02: qty 2

## 2021-05-02 MED ORDER — SODIUM CHLORIDE 0.45 % IV SOLN: INTRAVENOUS | Status: DC

## 2021-05-02 MED ORDER — ENOXAPARIN SODIUM 40 MG/0.4ML IJ SOSY
40.0000 mg | PREFILLED_SYRINGE | INTRAMUSCULAR | Status: DC
  Administered 2021-05-02: 40 mg via SUBCUTANEOUS
  Filled 2021-05-02: qty 0.4

## 2021-05-02 MED ORDER — GADOBUTROL 1 MMOL/ML IV SOLN
8.5000 mL | Freq: Once | INTRAVENOUS | Status: AC | PRN
  Administered 2021-05-02: 8.5 mL via INTRAVENOUS

## 2021-05-02 MED ORDER — HEPARIN SODIUM (PORCINE) 5000 UNIT/ML IJ SOLN: 5000.0000 [IU] | Freq: Three times a day (TID) | INTRAMUSCULAR | Status: DC

## 2021-05-02 MED ORDER — TRAZODONE HCL 50 MG PO TABS: 25.0000 mg | ORAL_TABLET | Freq: Every evening | ORAL | Status: DC | PRN

## 2021-05-02 MED ORDER — SENNOSIDES-DOCUSATE SODIUM 8.6-50 MG PO TABS
1.0000 | ORAL_TABLET | Freq: Every day | ORAL | Status: DC
  Administered 2021-05-02 – 2021-05-03 (×2): 1 via ORAL
  Filled 2021-05-02 (×2): qty 1

## 2021-05-02 MED ORDER — SENNA 8.6 MG PO TABS: 1.0000 | ORAL_TABLET | Freq: Two times a day (BID) | ORAL | Status: DC

## 2021-05-02 MED ORDER — KETOROLAC TROMETHAMINE 15 MG/ML IJ SOLN: 15.0000 mg | Freq: Four times a day (QID) | INTRAMUSCULAR | Status: DC | PRN

## 2021-05-02 MED ORDER — LORATADINE 10 MG PO TABS
10.0000 mg | ORAL_TABLET | Freq: Every day | ORAL | Status: DC
Start: 2021-05-02 — End: 2021-05-04
  Administered 2021-05-02 – 2021-05-03 (×2): 10 mg via ORAL
  Filled 2021-05-02 (×2): qty 1

## 2021-05-02 MED ORDER — HYDROCHLOROTHIAZIDE 25 MG PO TABS
25.0000 mg | ORAL_TABLET | Freq: Every day | ORAL | Status: DC
  Administered 2021-05-02 – 2021-05-03 (×2): 25 mg via ORAL
  Filled 2021-05-02 (×2): qty 1

## 2021-05-02 MED ORDER — HYDROMORPHONE HCL 1 MG/ML IJ SOLN
0.5000 mg | INTRAMUSCULAR | Status: DC | PRN
  Administered 2021-05-02 (×2): 0.5 mg via INTRAVENOUS
  Filled 2021-05-02 (×2): qty 0.5

## 2021-05-02 MED ORDER — INSULIN ASPART 100 UNIT/ML IJ SOLN
0.0000 [IU] | INTRAMUSCULAR | Status: DC
  Administered 2021-05-02: 5 [IU] via SUBCUTANEOUS
  Administered 2021-05-02 – 2021-05-03 (×4): 2 [IU] via SUBCUTANEOUS
  Administered 2021-05-03: 3 [IU] via SUBCUTANEOUS
  Administered 2021-05-03: 2 [IU] via SUBCUTANEOUS

## 2021-05-02 MED ORDER — LOSARTAN POTASSIUM-HCTZ 100-25 MG PO TABS: 1.0000 | ORAL_TABLET | Freq: Every day | ORAL | Status: DC

## 2021-05-02 MED ORDER — METOCLOPRAMIDE HCL 5 MG/ML IJ SOLN: 5.0000 mg | Freq: Four times a day (QID) | INTRAMUSCULAR | Status: DC | PRN

## 2021-05-02 NOTE — Assessment & Plan Note (Signed)
She reports that approximately 2 hrs after eating she will have 8/10 pain mostly LLQ with associated nausea but w/o vomiting. No diarrhea but mild constipation. On thorough questioning and exam the primary pain is not RUQ or epigastric. CT, U/S, MRI negative for any obstructing stone. LFT nl. Question of intestinal angina/vascular compromise. ? ?Plan Gi consult has been called by EDP ? Will provide treatment for Nausea ? Pain management with APAP or ketorolac ? Consider CTA abdomen for intestinal ischemia ? May need vascular consult ?

## 2021-05-02 NOTE — Assessment & Plan Note (Signed)
Stable with no respiratory signs or symptoms ? ?Plan Continue home regimen ?

## 2021-05-02 NOTE — Assessment & Plan Note (Signed)
-   Continue home meds °

## 2021-05-02 NOTE — Assessment & Plan Note (Signed)
Continue blood pressure control with losartan Continue diuresis with furosemide and spironolactone.  

## 2021-05-02 NOTE — Progress Notes (Signed)
? ?  Subjective:  ? ?No acute events overnight. Patient reports RUQ abdominal pain that began a few weeks ago, mentions this is similar to when she had a cholecystectomy 10 years ago. She states she has not been eating much and has only had two bowel movements in the last two weeks. She also reports two new medication changes recently.   ? ?Objective: ? ?Vital signs in last 24 hours: ?Vitals:  ? 05/02/21 1234 05/02/21 1236 05/02/21 1239 05/02/21 1541  ?BP:    131/77  ?Pulse: 87 91 93 96  ?Resp:    16  ?Temp:    99 ?F (37.2 ?C)  ?TempSrc:    Oral  ?SpO2:    95%  ?Weight:      ?Height:      ? ?General: Resting in bed, no acute distress ?CV: Regular rate, rhythm. No murmurs appreciated. ?Pulm: Normal respiratory effort on room air ?GI: Abd soft, non-distended, tenderness on RUQ. Hypoactive bowel sounds. ?Neuro: Awake, alert, no focal deficits.  ? ?Assessment/Plan: ? ?Janice Massey is 66yo person with hypertension, type II diabetes mellitus, previous cholecystectomy admitted 4/28 for RUQ pain, likely due to constipation.  ? ?Active Problems: ?  Diabetes ([place]) ?  Hyperlipidemia ?  Asthma ?  Abdominal pain ?  Essential hypertension ? ?#RUQ abdominal pain ?Janice Massey is reporting RUQ abdominal pain, radiating to her back, worse after meals. Previously had cholecystectomy 10 years ago. Imaging on arrival in the ED unremarkable. During our conversation this morning, she states she has only had two bowel movements in the last two weeks. This could certainly be contributing to her pain. Could consider holding amlodipine if constipation persists. Will also need to monitor how much dilaudid she is requiring, as this could also contribute. She has only required one dose over the last 24h, will d/c for now. We will plan to check TSH and start bowel regimen. In addition, can consider gastroparesis given her history of long-standing uncontrolled diabetes. Will start Reglan for her nausea. ?- Tap water enema ?- Senokot-S 1 tablet  daily ?- Metoclopramide '5mg'$  every 6 hours as needed for nausea ?- D/c dilaudid ?- TSH in AM ?- Consider GI consult if pain persists ? ?#Type II diabetes mellitus ?Will hold home empagliflozin due to its diuretic effect, especially with concomitant HCTZ use. Will continue other home medications and add sliding scale. Can adjust as needed. ?- Metformin '1000mg'$  twice daily ?- Tradjenta '5mg'$  daily ?- SSI ? ?#Dizziness ?Patient has experienced recent dizziness in setting of poor po intake. Will check orthostatics today. ?- F/u orthostatics ? ?#Hypokalemia ?K 3.2 this AM, giving oral repletion. Will r/p BMP and check Mg in AM. ? ?DIET: CM ?IVF: n/a ?DVT PPX: lovenox ?BOWEL: senokot-s ?CODE: FULL ?FAM COM: n/a ? ?Prior to Admission Living Arrangement: Home ?Anticipated Discharge Location: Home ?Barriers to Discharge: medical management ?Dispo: Anticipated discharge in approximately 1-2 day(s).  ? Sanjuan Dame, MD ?05/02/2021, 4:29 PM ?Pager: [phone] ?After 5pm on weekdays and 1pm on weekends: On Call pager [phone] ? ?

## 2021-05-02 NOTE — H&P (Signed)
?History and Physical  ? ? ?Janice Massey HYQ:[id number] DOB: October 08, 1955 DOA: 05/01/2021 ? ?DOS: the patient was seen and examined on 05/01/2021 ? ?PCP: Timothy Lasso, MD  ? ?Patient coming from: Home ? ?I have personally briefly reviewed patient's old medical records in [place] ? ?No new subjective & objective note has been filed under this hospital service since the last note was generated. ? [name], a 66 y/o with h/o DM, HTN, Chronic PID, h/o Cholecystectomy reports a 2 weeks h/o  progressively worsening left lower quadrant abdominal pain.  She describes her pain is postprandial at about 2 hrs and associated with nausea. She rates this as severe as similar to the pain she had with cholecystitis/cholelithiasis. She presents to [facility] for evaluation ? ?ED Course: T 98.6  143/88  HR 100 RR 16. EDP exam notable for marked RUQ abdominal tenderness on exam. Lab reveals K 3.3, Glucose 203 nl DBC, neative U/A. CT A/P w/o obstruction, heptic steatosis. Patient is s/p cholecystectomy. Abd U/S with hepatic steatosis, CBD 18m, no stones. Due to significant tenderness, despite unremarkable imaging EDP recommends admission for MRCP to r/o obstruction stone hepatic or pancreatic duct and GI consultation - on call for [facility] notified by secure chat.  ? ?Review of Systems:  ?Review of Systems  ?Constitutional: Negative.   ?HENT:  Negative for hearing loss.   ?Eyes: Negative.   ?Respiratory: Negative.    ?Cardiovascular:  Negative for chest pain, palpitations and leg swelling.  ?Gastrointestinal:  Positive for abdominal pain and nausea. Negative for vomiting.  ?Genitourinary: Negative.   ?Musculoskeletal: Negative.   ?Skin: Negative.   ?Neurological: Negative.   ?Psychiatric/Behavioral: Negative.    ? ?Past Medical History:  ?Diagnosis Date  ? Back pain   ? Cardiac murmur   ? small membranous VSD with fairly loud cardiac murmur (asymptomatic)   ? Chronic PID (chronic pelvic inflammatory disease)   ? Deaf   ? Since  age 66 ? Diabetes mellitus   ? History of Doppler ultrasound 06/20/09  ? Normal, no prior studies for comparison.   ? Hypertension   ? [place]   ? Obese   ? URI (upper respiratory infection)   ? Ventricular septal defect 2004 per echo  ? small membranous VSD  ? ? ?Past Surgical History:  ?Procedure Laterality Date  ? CARDIAC CATHETERIZATION  08/23/95  ? CHOLECYSTECTOMY  11/04/1993  ? COLPOSCOPY    ? ENDOMETRIAL BIOPSY  02/29/2020  ?    ? TUBAL LIGATION  10/05/1978  ? ? ? reports that she has never smoked. She has never used smokeless tobacco. She reports that she does not drink alcohol and does not use drugs. ? ?Allergies  ?Allergen Reactions  ? Codeine Itching  ? Lisinopril Cough  ? Penicillins Itching  ?  Has patient had a PCN reaction causing immediate rash, facial/tongue/throat swelling, SOB or lightheadedness with hypotension: YES ?Has patient had a PCN reaction causing severe rash involving mucus membranes or skin necrosis: NO ?Has patient had a PCN reaction that required hospitalization NO ?Has patient had a PCN reaction occurring within the last 10 years: NO ?If all of the above answers are "NO", then may proceed with Cephalosporin use.  ? ? ?Family History  ?Problem Relation Age of Onset  ? Cancer Mother   ?     liver  ? Heart attack Mother 765 ? Hypertension Father   ? Diabetes Father   ? Heart attack Father   ? CAD Father  60  ? Diabetes Paternal Aunt   ? Diabetes Maternal Grandfather   ? Breast cancer Cousin   ? Colon cancer Neg Hx   ? Endometrial cancer Neg Hx   ? Pancreatic cancer Neg Hx   ? Prostate cancer Neg Hx   ? Ovarian cancer Neg Hx   ? ? ?Prior to Admission medications   ?Medication Sig Start Date End Date Taking? Authorizing Provider  ?amLODipine (NORVASC) 10 MG tablet Take 1 tablet (10 mg total) by mouth daily. 04/08/21 04/08/22 Yes [name], Loralyn Freshwater, MD  ?aspirin 81 MG chewable tablet Chew 1 tablet (81 mg total) by mouth daily. 11/18/20  Yes Lajean Manes, MD  ?atorvastatin (LIPITOR) 40 MG tablet Take 1 tablet  (40 mg total) by mouth daily. ?Patient taking differently: Take 40 mg by mouth at bedtime. 03/02/21 05/31/21 Yes Atway, Rayann N, DO  ?cetirizine (ZYRTEC) 10 MG tablet Take 1 tablet (10 mg total) by mouth daily as needed for allergies. 03/02/21  Yes Atway, Rayann N, DO  ?empagliflozin (JARDIANCE) 10 MG TABS tablet Take 1 tablet (10 mg total) by mouth daily before breakfast. 04/08/21  Yes [name], [name], MD  ?fluticasone (FLONASE) 50 MCG/ACT nasal spray SHAKE LIQUID AND USE 2 SPRAYS IN EACH NOSTRIL DAILY ?Patient taking differently: Place 1 spray into both nostrils daily as needed for allergies. 11/18/20  Yes Lajean Manes, MD  ?hydrochlorothiazide (HYDRODIURIL) 25 MG tablet Take 25 mg by mouth daily. 01/30/21  Yes [provider]  ?losartan-hydrochlorothiazide (HYZAAR) 100-25 MG tablet Take 1 tablet by mouth daily. 04/08/21  Yes [name], Loralyn Freshwater, MD  ?metFORMIN (GLUCOPHAGE) 1000 MG tablet TAKE 1 TABLET(1000 MG) BY MOUTH TWICE DAILY ?Patient taking differently: Take 1,000 mg by mouth daily with breakfast. 03/02/21  Yes Atway, Rayann N, DO  ?omeprazole (PRILOSEC) 40 MG capsule TAKE 1 CAPSULE(40 MG) BY MOUTH DAILY ?Patient taking differently: Take 40 mg by mouth daily. 04/08/21  Yes [name], Loralyn Freshwater, MD  ?Accu-Chek FastClix Lancets MISC 1 Units by Does not apply route daily. Use once a daily in morning to check blood glucose 01/16/21   Iona Beard, MD  ?Blood Glucose Monitoring Suppl (ACCU-CHEK GUIDE) w/Device KIT 1 Units by Does not apply route daily. 01/16/21   Iona Beard, MD  ?empagliflozin (JARDIANCE) 25 MG TABS tablet Take 1 tablet (25 mg total) by mouth daily before breakfast. ?Patient not taking: Reported on 05/01/2021 05/08/21   Harvie Heck, MD  ?glucose blood (ACCU-CHEK GUIDE) test strip Use as instructed 01/16/21   Iona Beard, MD  ?latanoprost (XALATAN) 0.005 % ophthalmic solution INSTILL 1 DROP IN BOTH EYES EVERY DAY IN THE EVENING ?Patient not taking: Reported on 05/01/2021 10/06/20   Harvie Heck, MD  ?meloxicam  (MOBIC) 15 MG tablet Take 1 tablet (15 mg total) by mouth daily. ?Patient not taking: Reported on 05/01/2021 07/17/20   Riesa Pope, MD  ?sitaGLIPtin (JANUVIA) 100 MG tablet TAKE 1 TABLET(100 MG) BY MOUTH DAILY ?Patient not taking: Reported on 05/01/2021 03/02/21   Dorethea Clan, DO  ? ? ?Physical Exam: ?Vitals:  ? 05/01/21 1625 05/01/21 2003 05/01/21 2147 05/02/21 0137  ?BP: (!) 146/83 136/76 (!) 143/88 127/77  ?Pulse: 85 96 100 (!) 111  ?Resp: '14 16 16 16  ' ?Temp: 98.6 ?F (37 ?C)     ?TempSrc: Oral     ?SpO2: 97% 93% 92% 96%  ?Weight:      ?Height:      ? ? ?Physical Exam ?Constitutional:   ?   General: She is not in acute distress. ?  Appearance: Normal appearance. She is obese. She is not toxic-appearing.  ?HENT:  ?   Head: Normocephalic and atraumatic.  ?   Mouth/Throat:  ?   Mouth: Mucous membranes are moist.  ?Eyes:  ?   Extraocular Movements: Extraocular movements intact.  ?   Conjunctiva/sclera: Conjunctivae normal.  ?   Pupils: Pupils are equal, round, and reactive to light.  ?Cardiovascular:  ?   Rate and Rhythm: Normal rate and regular rhythm.  ?   Pulses: Normal pulses.  ?   Heart sounds: Normal heart sounds.  ?Pulmonary:  ?   Effort: Pulmonary effort is normal.  ?   Breath sounds: Normal breath sounds.  ?Abdominal:  ?   Palpations: Abdomen is soft.  ?   Tenderness: There is abdominal tenderness. There is no guarding or rebound.  ?   Comments: Obese abdomen. With help of sign language interpreter: patient has diffuse abdominal pain but worst at LLQ. No guarding, no rebound tenderness. Mild tenderness to deep palpation Upper abdomen.   ?Musculoskeletal:     ?   General: Normal range of motion.  ?Skin: ?   General: Skin is warm and dry.  ?Neurological:  ?   Mental Status: She is alert and oriented to person, place, and time. Mental status is at baseline.  ?   Comments: Deaf with limited speech  ?Psychiatric:     ?   Mood and Affect: Mood normal.  ?  ? ?Labs on Admission: I have personally reviewed  following labs and imaging studies ? ?CBC: ?Recent Labs  ?Lab 05/01/21 ?1232  ?WBC 9.5  ?HGB 14.1  ?HCT 43.6  ?MCV 89.3  ?PLT 323  ? ?Basic Metabolic Panel: ?Recent Labs  ?Lab 05/01/21 ?1232  ?NA 139  ?

## 2021-05-02 NOTE — Assessment & Plan Note (Signed)
04/08/21 A1C 11.5%. At risk for vascular complications I poorly controlled Diabetic. ? ?Plan Continue home regimen  ? SS coverage ?

## 2021-05-03 DIAGNOSIS — R1011 Right upper quadrant pain: Secondary | ICD-10-CM | POA: Diagnosis not present

## 2021-05-03 LAB — BASIC METABOLIC PANEL
Anion gap: 11 (ref 5–15)
BUN: 17 mg/dL (ref 8–23)
CO2: 25 mmol/L (ref 22–32)
Calcium: 9.2 mg/dL (ref 8.9–10.3)
Chloride: 101 mmol/L (ref 98–111)
Creatinine, Ser: 0.81 mg/dL (ref 0.44–1.00)
GFR, Estimated: >60 mL/min (ref >60)
Glucose, Bld: 112 mg/dL — ABNORMAL HIGH (ref 70–99)
Potassium: 3.3 mmol/L — ABNORMAL LOW (ref 3.5–5.1)
Sodium: 137 mmol/L (ref 135–145)

## 2021-05-03 LAB — CBC
HCT: 39.8 % (ref 36.0–46.0)
Hemoglobin: 13 g/dL (ref 12.0–15.0)
MCH: 29.1 pg (ref 26.0–34.0)
MCHC: 32.7 g/dL (ref 30.0–36.0)
MCV: 89 fL (ref 80.0–100.0)
Platelets: 266 10*3/uL (ref 150–400)
RBC: 4.47 MIL/uL (ref 3.87–5.11)
RDW: 13 % (ref 11.5–15.5)
WBC: 9.4 10*3/uL (ref 4.0–10.5)
nRBC: 0 % (ref 0.0–0.2)

## 2021-05-03 LAB — GLUCOSE, CAPILLARY
Glucose-Capillary: 108 mg/dL — ABNORMAL HIGH (ref 70–99)
Glucose-Capillary: 114 mg/dL — ABNORMAL HIGH (ref 70–99)
Glucose-Capillary: 121 mg/dL — ABNORMAL HIGH (ref 70–99)
Glucose-Capillary: 165 mg/dL — ABNORMAL HIGH (ref 70–99)
Glucose-Capillary: 86 mg/dL (ref 70–99)

## 2021-05-03 LAB — MAGNESIUM: Magnesium: 1.8 mg/dL (ref 1.7–2.4)

## 2021-05-03 MED ORDER — POTASSIUM CHLORIDE 20 MEQ PO PACK
20.0000 meq | PACK | Freq: Once | ORAL | Status: AC
  Administered 2021-05-03: 20 meq via ORAL
  Filled 2021-05-03: qty 1

## 2021-05-03 MED ORDER — SENNOSIDES-DOCUSATE SODIUM 8.6-50 MG PO TABS: 1.0000 | ORAL_TABLET | Freq: Every day | ORAL | 0 refills | Status: DC

## 2021-05-03 MED ORDER — POLYETHYLENE GLYCOL 3350 17 G PO PACK: 17.0000 g | PACK | Freq: Every day | ORAL | 0 refills | Status: DC

## 2021-05-03 MED ORDER — POLYETHYLENE GLYCOL 3350 17 G PO PACK
17.0000 g | PACK | Freq: Once | ORAL | Status: AC
  Administered 2021-05-03: 17 g via ORAL
  Filled 2021-05-03: qty 1

## 2021-05-03 NOTE — Discharge Summary (Signed)
? ?Name: Janice Massey ?MRN: [id number] ?DOB: Apr 04, 1955 66 y.o. ?PCP: Timothy Lasso, MD ? ?Date of Admission: 05/01/2021 12:18 PM ?Date of Discharge: 05/03/21 ?Attending Physician: Dr. Evette Doffing ? ?Discharge Diagnosis: ?Active Problems: ?  Diabetes ([place]) ?  Hyperlipidemia ?  Asthma ?  Abdominal pain ?  Essential hypertension ?  Constipation ?  ? ?Discharge Medications: ?Allergies as of 05/03/2021   ? ?   Reactions  ? Codeine Itching  ? Lisinopril Cough  ? Penicillins Itching  ? Has patient had a PCN reaction causing immediate rash, facial/tongue/throat swelling, SOB or lightheadedness with hypotension: YES ?Has patient had a PCN reaction causing severe rash involving mucus membranes or skin necrosis: NO ?Has patient had a PCN reaction that required hospitalization NO ?Has patient had a PCN reaction occurring within the last 10 years: NO ?If all of the above answers are "NO", then may proceed with Cephalosporin use.  ? ?  ? ?  ?Medication List  ?  ? ?STOP taking these medications   ? ?empagliflozin 10 MG Tabs tablet ?Commonly known as: JARDIANCE ?  ?empagliflozin 25 MG Tabs tablet ?Commonly known as: JARDIANCE ?  ?hydrochlorothiazide 25 MG tablet ?Commonly known as: HYDRODIURIL ?  ?meloxicam 15 MG tablet ?Commonly known as: MOBIC ?  ? ?  ? ?TAKE these medications   ? ?Accu-Chek FastClix Lancets Misc ?1 Units by Does not apply route daily. Use once a daily in morning to check blood glucose ?  ?Accu-Chek Guide test strip ?Generic drug: glucose blood ?Use as instructed ?  ?Accu-Chek Guide w/Device Kit ?1 Units by Does not apply route daily. ?  ?amLODipine 10 MG tablet ?Commonly known as: NORVASC ?Take 1 tablet (10 mg total) by mouth daily. ?  ?aspirin 81 MG chewable tablet ?Chew 1 tablet (81 mg total) by mouth daily. ?  ?atorvastatin 40 MG tablet ?Commonly known as: LIPITOR ?Take 1 tablet (40 mg total) by mouth daily. ?What changed: when to take this ?  ?cetirizine 10 MG tablet ?Commonly known as: ZYRTEC ?Take 1 tablet  (10 mg total) by mouth daily as needed for allergies. ?  ?fluticasone 50 MCG/ACT nasal spray ?Commonly known as: FLONASE ?SHAKE LIQUID AND USE 2 SPRAYS IN EACH NOSTRIL DAILY ?What changed:  ?how much to take ?how to take this ?when to take this ?reasons to take this ?additional instructions ?  ?latanoprost 0.005 % ophthalmic solution ?Commonly known as: XALATAN ?INSTILL 1 DROP IN BOTH EYES EVERY DAY IN THE EVENING ?  ?losartan-hydrochlorothiazide 100-25 MG tablet ?Commonly known as: Hyzaar ?Take 1 tablet by mouth daily. ?  ?metFORMIN 1000 MG tablet ?Commonly known as: GLUCOPHAGE ?TAKE 1 TABLET(1000 MG) BY MOUTH TWICE DAILY ?What changed:  ?how much to take ?how to take this ?when to take this ?additional instructions ?  ?omeprazole 40 MG capsule ?Commonly known as: PRILOSEC ?TAKE 1 CAPSULE(40 MG) BY MOUTH DAILY ?What changed:  ?how much to take ?how to take this ?when to take this ?additional instructions ?  ?polyethylene glycol 17 g packet ?Commonly known as: MiraLax ?Take 17 g by mouth daily. ?  ?senna-docusate 8.6-50 MG tablet ?Commonly known as: Senokot-S ?Take 1 tablet by mouth daily. ?Start taking on: May 04, 2021 ?  ?sitaGLIPtin 100 MG tablet ?Commonly known as: Januvia ?TAKE 1 TABLET(100 MG) BY MOUTH DAILY ?  ? ?  ? ? ?Disposition and follow-up:   ?[name] was discharged from [facility] in Stable condition.  At the hospital follow up visit please address: ? ?1.  Follow-up: ?a.  Abdominal pain 2/2 to constipation- Instructed to start bowel regimen with miralax and senna daily ?  ?b. Uncontrolled diabetes- recent HgbA1c at 11.5 04/08/21. Wilder Glade held at discharge due to concern that this contributed to constipation due to dehydration. Last note from 4/5 mentioned consider of switching to GLP-1 or starting insulin if no improvement in A1c with empagliflozin. ? ? c. Hypokalemia-K repleted while in hospital. She takes losartan and HCTZ ? ? ?2.  Labs / imaging needed at time of follow-up:  BMP ? ?3.  Pending labs/ test needing follow-up: none ? ?4.  Medication Changes ? Started: ? 1. Miralax ? 2. Senna ? Stopped: ? 1. Wilder Glade ? ?Follow-up Appointments: ?Message sent to [facility] front office for hospital follow-up ? ?Hospital Course by problem list: ?Right upper quadrant pain secondary to constipation ?Patient presented with intermittent right upper quadrant abdominal pain with nausea, constipation, and decreased p.o. intake.  She has a history of cholestasis status post cholecystectomy.  Work-up with CMP, lipase, CT abdomen pelvis, and right upper quadrant ultrasound negative.  MRCP was negative.  She reports 2 bowel movements over the last 2 weeks.  She takes MiraLAX on occasion at home but does not take this regularly.  She recently started taking amlodipine and empagliflozin about 3 weeks ago.  With diuretic effect of empagliflozin, this was discontinued due to concern dehydration could be contributing to constipation with her concurrent hydrochlorothiazide use.  She had multiple bowel movements after several enemas and initiating a bowel regimen.  She noted improvement in her pain following bowel movements.  She was able to eat and drink with improvement in abdominal pain. ? ?Dizziness ?She reported dizziness.  Orthostatics were negative.  She noted improvement in her symptoms the following day. ? ?Poorly- controlled type 2 diabetes ?Hemoglobin A1c greater than 11 in early April.  She was recently started on empagliflozin, but this was discontinued due to concern that dehydration could be contributing to constipation.  Other diabetes medications include metformin 1000 mg twice daily and Sitagliptin 100 mg once daily.  ? ?Hypertension ?Medications include amlodipine 10 mg, losartan-hydrochlorothiazide 100-25 mg ? ?Hyperlipidemia ?Home medications include atorvastatin 40 mg. ?  ?Subjective: ?Patient assessed following lunch.  She states that she was able to eat lunch with improvement in abdominal pain.   She had a bowel movement following enema earlier today.  She signs that she is ready to go home. ? ?Discharge Exam:   ?BP 115/82 (BP Location: Left Arm)   Pulse (!) 103   Temp 98.5 ?F (36.9 ?C) (Oral)   Resp 17   Ht '5\' 7"'  (1.702 m)   Wt 81.6 kg   SpO2 95%   BMI 28.19 kg/m?  ?Constitutional: well-appearing, laying in bed ?HENT: normocephalic, mucous membranes moist ?Eyes: conjunctiva non-erythematous ?Neck: supple ?Cardiovascular: regular rate and rhythm, 2/6 holo systolic murmur best heard in left sternal border ?Pulmonary/Chest: normal work of breathing on room air, lungs clear to auscultation bilaterally ?Abdominal: soft, tender in right lower quadrant ?MSK: normal bulk and tone ?Neurological: alert & oriented x 3 ?Skin: warm and dry ?Psych: normal mood and affect ? ?Pertinent Labs, Studies, and Procedures:  ? ?  Latest Ref Rng & Units 05/03/2021  ? 12:29 AM 05/02/2021  ?  6:36 AM 05/01/2021  ? 12:32 PM  ?CBC  ?WBC 4.0 - 10.5 K/uL 9.4   11.8   9.5    ?Hemoglobin 12.0 - 15.0 g/dL 13.0   13.6   14.1    ?Hematocrit  36.0 - 46.0 % 39.8   40.1   43.6    ?Platelets 150 - 400 K/uL 266   297   323    ? ? ? ?  Latest Ref Rng & Units 05/03/2021  ? 12:29 AM 05/02/2021  ?  6:36 AM 05/01/2021  ? 12:32 PM  ?CMP  ?Glucose 70 - 99 mg/dL 112   136   204    ?BUN 8 - 23 mg/dL '17   17   14    ' ?Creatinine 0.44 - 1.00 mg/dL 0.81   0.83   0.88    ?Sodium 135 - 145 mmol/L 137   139   139    ?Potassium 3.5 - 5.1 mmol/L 3.3   3.2   3.3    ?Chloride 98 - 111 mmol/L 101   103   102    ?CO2 22 - 32 mmol/L '25   25   26    ' ?Calcium 8.9 - 10.3 mg/dL 9.2   9.4   9.9    ?Total Protein 6.5 - 8.1 g/dL   7.4    ?Total Bilirubin 0.3 - 1.2 mg/dL   0.8    ?Alkaline Phos 38 - 126 U/L   64    ?AST 15 - 41 U/L   21    ?ALT 0 - 44 U/L   28    ? ? ?CT ABDOMEN PELVIS W CONTRAST ? ?Result Date: 05/01/2021 ?CLINICAL DATA:  Pain right lower quadrant EXAM: CT ABDOMEN AND PELVIS WITH CONTRAST TECHNIQUE: Multidetector CT imaging of the abdomen and pelvis was  performed using the standard protocol following bolus administration of intravenous contrast. RADIATION DOSE REDUCTION: This exam was performed according to the departmental dose-optimization program which includes au

## 2021-05-03 NOTE — Hospital Course (Signed)
Right upper quadrant pain secondary to constipation ?Patient presented with intermittent right upper quadrant abdominal pain with nausea, constipation, and decreased p.o. intake.  She has a history of cholestasis status post cholecystectomy.  Work-up with CMP, lipase, CT abdomen pelvis, and right upper quadrant ultrasound negative.  MRCP was negative.  She reports 2 bowel movements over the last 2 weeks.  She takes MiraLAX on occasion at home but does not take this regularly.  She recently started taking amlodipine and empagliflozin about 3 weeks ago.  With diuretic effect of empagliflozin, this was discontinued due to concern dehydration could be contributing to constipation with her concurrent hydrochlorothiazide use.  She had multiple bowel movements after several enemas and initiating a bowel regimen.  She noted improvement in her pain following bowel movements.  She was able to eat and drink with improvement in abdominal pain. ? ?Dizziness ?She reported dizziness.  Orthostatics were negative.  She noted improvement in her symptoms the following day. ? ?Poorly- controlled type 2 diabetes ?Hemoglobin A1c greater than 11 in early April.  She was recently started on empagliflozin, but this was discontinued due to concern that dehydration could be contributing to constipation.  Other diabetes medications include metformin 1000 mg twice daily and Sitagliptin 100 mg once daily.  ? ?Hypertension ?Medications include amlodipine 10 mg, losartan-hydrochlorothiazide 100-25 mg ? ?Hyperlipidemia ?Home medications include atorvastatin 40 mg. ? ?

## 2021-05-03 NOTE — Progress Notes (Signed)
? ?SUBJECTIVE:  ?Patient Summary: Janice Massey is a 66 y.o. with a pertinent PMH of uncontrolled diabetes and hypertension, who presented with right upper quadrant pain and admitted on 4/28 for RUQ abdominal pain.  ? ?Overnight Events: none ? ?Interim History: Patient assessed at bedside this AM. Interpreter services used. She reports that her abdominal pain is better this morning. She did not have pain after eating yesterday and had a small bowel movement. She states that she feels ok to go home today. ? ?OBJECTIVE:  ?Vital Signs: ?Vitals:  ? 05/02/21 1239 05/02/21 1541 05/02/21 1955 05/03/21 0428  ?BP:  131/77 133/81 106/79  ?Pulse: 93 96 86 90  ?Resp:  16 16   ?Temp:  99 ?F (37.2 ?C) 98.6 ?F (37 ?C) 98.7 ?F (37.1 ?C)  ?TempSrc:  Oral Oral Oral  ?SpO2:  95% 92% 95%  ?Weight:      ?Height:      ? ?Supplemental O2: Room Air ?SpO2: 95 % ?O2 Flow Rate (L/min): 2 L/min ? ?Filed Weights  ? 05/01/21 1226  ?Weight: 81.6 kg  ? ? ? ?Intake/Output Summary (Last 24 hours) at 05/03/2021 0721 ?Last data filed at 05/02/2021 2005 ?Gross per 24 hour  ?Intake 120 ml  ?Output 25 ml  ?Net 95 ml  ? ?Net IO Since Admission: 95 mL [05/03/21 0721] ? ?Physical Exam: ?Constitutional: well-appearing, laying in bed ?HENT: normocephalic, mucous membranes moist ?Eyes: conjunctiva non-erythematous ?Neck: supple ?Cardiovascular: regular rate and rhythm, 2/6 holo systolic murmur best heard in left sternal border ?Pulmonary/Chest: normal work of breathing on room air, lungs clear to auscultation bilaterally ?Abdominal: soft, tender in right lower quadrant ?MSK: normal bulk and tone ?Neurological: alert & oriented x 3 ?Skin: warm and dry ?Psych: normal mood and affect ? ? ?Patient Lines/Drains/Airways Status   ? ? Active Line/Drains/Airways   ? ? Name Placement date Placement time Site Days  ? Peripheral IV 05/01/21 20 G Anterior;Right Forearm 05/01/21  1900  Forearm  2  ? Wound / Incision (Open or Dehisced) 12/29/14 Laceration Finger (Comment  which one) Left 1cm lac to Lt index finger 12/29/14  1724  Finger (Comment which one)  2317  ? ?  ?  ? ?  ? ? ?Pertinent Labs: ? ?  Latest Ref Rng & Units 05/03/2021  ? 12:29 AM 05/02/2021  ?  6:36 AM 05/01/2021  ? 12:32 PM  ?CBC  ?WBC 4.0 - 10.5 K/uL 9.4   11.8   9.5    ?Hemoglobin 12.0 - 15.0 g/dL 13.0   13.6   14.1    ?Hematocrit 36.0 - 46.0 % 39.8   40.1   43.6    ?Platelets 150 - 400 K/uL 266   297   323    ? ? ? ?  Latest Ref Rng & Units 05/03/2021  ? 12:29 AM 05/02/2021  ?  6:36 AM 05/01/2021  ? 12:32 PM  ?CMP  ?Glucose 70 - 99 mg/dL 112   136   204    ?BUN 8 - 23 mg/dL '17   17   14    '$ ?Creatinine 0.44 - 1.00 mg/dL 0.81   0.83   0.88    ?Sodium 135 - 145 mmol/L 137   139   139    ?Potassium 3.5 - 5.1 mmol/L 3.3   3.2   3.3    ?Chloride 98 - 111 mmol/L 101   103   102    ?CO2 22 - 32 mmol/L 25   25  26    ?Calcium 8.9 - 10.3 mg/dL 9.2   9.4   9.9    ?Total Protein 6.5 - 8.1 g/dL   7.4    ?Total Bilirubin 0.3 - 1.2 mg/dL   0.8    ?Alkaline Phos 38 - 126 U/L   64    ?AST 15 - 41 U/L   21    ?ALT 0 - 44 U/L   28    ? ? ?Recent Labs  ?  05/02/21 ?2001 05/03/21 ?0020 05/03/21 ?0426  ?GLUCAP 133* 108* 121*  ?  ? ?Pertinent Imaging: ?No results found. ? ?ASSESSMENT/PLAN:  ?Assessment: ?Active Problems: ?  Diabetes ([place]) ?  Hyperlipidemia ?  Asthma ?  Abdominal pain ?  Essential hypertension ?  Constipation ? ? ?Janice Massey is a 66 y.o. with a pertinent PMH of uncontrolled diabetes and hypertension, who presented with right upper quadrant pain and admitted on 4/28 for RUQ abdominal pain.  ? ?Plan: ?#Right upper quadrant abdominal pain ?Patient reports improvement in abdominal pain today. She did not have abdominal pain after eating yesterday and had a small bowel movement. She does take miralax on occasion at home.On exam she has mild tenderness present in right lower quadrant, abdomen is soft and non-distended. She remains afebrile and prior imaging was negative for intra-abdominal finding outside of moderate stool  burden. I would like to make sure she does well with eating today and then send her home this evening if she continues to feel well. ?-Scheduled MiraLAX ?-Scheduled senna ? ?#Type 2 diabetes mellitus ?Blood glucose well-controlled between 110-140s. ?-Linagliptin 5 mg daily ?-Holding empagliflozin ? ?#Dizziness ?Orthostatics negative 4/29.  ? ?Best Practice: ?Diet: Diabetic diet ?IVF: N/A ?VTE: enoxaparin (LOVENOX) injection 40 mg Start: 05/02/21 1400 ?Code: Full ?Therapy Recs: N/A ?Family Contact: Sister, to be notified. ?DISPO: Anticipated discharge today to Home pending  no further episodes of abdominal pain with eating lunch . ? ?Signature: ?Christiana Fuchs, D.O. ?Internal Medicine Resident, PGY-1 ?[facility]  ?Pager: [phone] ?7:21 AM, 05/03/2021  ? ?Please contact the on call pager after 5 pm and on weekends at [phone].  ?

## 2021-05-04 NOTE — ED Provider Notes (Signed)
?[place] ? ? ? ?CSN: [id number] ?Arrival date & time: 05/01/21  1035 ? ? ?  ? ?History   ?Chief Complaint ?Chief Complaint  ?Patient presents with  ? Abdominal Pain  ? Back Pain  ? ? ?HPI ?Janice Massey is a 66 y.o. female comes to the urgent care with 2-week history of right lower quadrant abdominal pain.  Pain onset was insidious and has progressively worsened.  She describes the pain as sharp, constant with no known relieving factors.  Patient has a history of constipation and she endorses passing hard stools this morning.  No abdominal distention or vomiting.  Abdominal pain is not related for food.  No fever or chills.  Pain does not radiate.  No weight loss.  No night sweats.  Pain is more crampy in nature.  No trauma to the abdomen.  No falls. ?HPI ? ?Past Medical History:  ?Diagnosis Date  ? Back pain   ? Cardiac murmur   ? small membranous VSD with fairly loud cardiac murmur (asymptomatic)   ? Chronic PID (chronic pelvic inflammatory disease)   ? Deaf   ? Since age 40  ? Diabetes mellitus   ? History of Doppler ultrasound 06/20/09  ? Normal, no prior studies for comparison.   ? Hypertension   ? Mute   ? Obese   ? URI (upper respiratory infection)   ? Ventricular septal defect 2004 per echo  ? small membranous VSD  ? ? ?Patient Active Problem List  ? Diagnosis Date Noted  ? Constipation   ? Depression screening 01/19/2021  ? Cervical stenosis (uterine cervix) 02/29/2020  ? Healthcare maintenance 01/30/2020  ? Breast lump on left side at 5 o'clock position 07/31/2019  ? 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 Post-menopausal bleeding 10/01/2013  ? VSD (ventricular septal defect), perimembranous 05/29/2013  ? Encounter for screening colonoscopy 11/09/2011  ? Essential hypertension 08/10/2011  ? Hyperlipidemia 10/11/2007  ? Abdominal pain 08/22/2006  ? Hearing loss 11/10/2005  ? Asthma 11/10/2005  ? GERD 11/10/2005  ? Diabetes ([place]) 04/05/2002  ? ? ?Past Surgical History:  ?Procedure  Laterality Date  ? CARDIAC CATHETERIZATION  08/23/95  ? CHOLECYSTECTOMY  11/04/1993  ? COLPOSCOPY    ? ENDOMETRIAL BIOPSY  02/29/2020  ?    ? TUBAL LIGATION  10/05/1978  ? ? ?OB History   ? ? Gravida  ?2  ? Para  ?2  ? Term  ?2  ? Preterm  ?   ? AB  ?   ? Living  ?2  ?  ? ? SAB  ?   ? IAB  ?   ? Ectopic  ?   ? Multiple  ?   ? Live Births  ?   ?   ?  ? Obstetric Comments  ?Vaginal delivery x 2  ?  ? ?  ? ? ? ?Home Medications   ? ?Prior to Admission medications   ?Medication Sig Start Date End Date Taking? Authorizing Provider  ?Accu-Chek FastClix Lancets MISC 1 Units by Does not apply route daily. Use once a daily in morning to check blood glucose 01/16/21   Iona Beard, MD  ?amLODipine (NORVASC) 10 MG tablet Take 1 tablet (10 mg total) by mouth daily. 04/08/21 04/08/22  Harvie Heck, MD  ?aspirin 81 MG chewable tablet Chew 1 tablet (81 mg total) by mouth daily. 11/18/20   Lajean Manes, MD  ?atorvastatin (LIPITOR) 40 MG tablet Take 1 tablet (40 mg total) by mouth daily. ?Patient  taking differently: Take 40 mg by mouth at bedtime. 03/02/21 05/31/21  Dorethea Clan, DO  ?Blood Glucose Monitoring Suppl (ACCU-CHEK GUIDE) w/Device KIT 1 Units by Does not apply route daily. 01/16/21   Iona Beard, MD  ?cetirizine (ZYRTEC) 10 MG tablet Take 1 tablet (10 mg total) by mouth daily as needed for allergies. 03/02/21   Atway, Rayann N, DO  ?fluticasone (FLONASE) 50 MCG/ACT nasal spray SHAKE LIQUID AND USE 2 SPRAYS IN EACH NOSTRIL DAILY ?Patient taking differently: Place 1 spray into both nostrils daily as needed for allergies. 11/18/20   Lajean Manes, MD  ?glucose blood (ACCU-CHEK GUIDE) test strip Use as instructed 01/16/21   Iona Beard, MD  ?latanoprost (XALATAN) 0.005 % ophthalmic solution INSTILL 1 DROP IN BOTH EYES EVERY DAY IN THE EVENING ?Patient not taking: Reported on 05/01/2021 10/06/20   Harvie Heck, MD  ?losartan-hydrochlorothiazide (HYZAAR) 100-25 MG tablet Take 1 tablet by mouth daily. 04/08/21   Harvie Heck, MD   ?metFORMIN (GLUCOPHAGE) 1000 MG tablet TAKE 1 TABLET(1000 MG) BY MOUTH TWICE DAILY ?Patient taking differently: Take 1,000 mg by mouth daily with breakfast. 03/02/21   Atway, Rayann N, DO  ?omeprazole (PRILOSEC) 40 MG capsule TAKE 1 CAPSULE(40 MG) BY MOUTH DAILY ?Patient taking differently: Take 40 mg by mouth daily. 04/08/21   Harvie Heck, MD  ?polyethylene glycol (MIRALAX) 17 g packet Take 17 g by mouth daily. 05/03/21   Masters, Katie, DO  ?senna-docusate (SENOKOT-S) 8.6-50 MG tablet Take 1 tablet by mouth daily. 05/04/21   Masters, Katie, DO  ?sitaGLIPtin (JANUVIA) 100 MG tablet TAKE 1 TABLET(100 MG) BY MOUTH DAILY ?Patient not taking: Reported on 05/01/2021 03/02/21   Dorethea Clan, DO  ? ? ?Family History ?Family History  ?Problem Relation Age of Onset  ? Cancer Mother   ?     liver  ? Heart attack Mother 39  ? Hypertension Father   ? Diabetes Father   ? Heart attack Father   ? CAD Father 24  ? Diabetes Paternal Aunt   ? Diabetes Maternal Grandfather   ? Breast cancer Cousin   ? Colon cancer Neg Hx   ? Endometrial cancer Neg Hx   ? Pancreatic cancer Neg Hx   ? Prostate cancer Neg Hx   ? Ovarian cancer Neg Hx   ? ? ?Social History ?Social History  ? ?Tobacco Use  ? Smoking status: Never  ? Smokeless tobacco: Never  ?Vaping Use  ? Vaping Use: Never used  ?Substance Use Topics  ? Alcohol use: No  ?  Alcohol/week: 0.0 standard drinks  ? Drug use: No  ? ? ? ?Allergies   ?Codeine, Lisinopril, and Penicillins ? ? ?Review of Systems ?Review of Systems  ?Gastrointestinal:  Positive for abdominal pain and constipation. Negative for anal bleeding, blood in stool, diarrhea, nausea and vomiting.  ?Musculoskeletal: Negative.   ?Neurological: Negative.   ?Psychiatric/Behavioral: Negative.    ? ? ?Physical Exam ?Triage Vital Signs ?ED Triage Vitals  ?Enc Vitals Group  ?   BP 05/01/21 1133 137/90  ?   Pulse Rate 05/01/21 1133 99  ?   Resp 05/01/21 1133 16  ?   Temp 05/01/21 1133 97.9 ?F (36.6 ?C)  ?   Temp Source 05/01/21 1133  Oral  ?   SpO2 05/01/21 1133 96 %  ?   Weight 05/01/21 1132 181 lb 3.5 oz (82.2 kg)  ?   Height 05/01/21 1132 _0  (1.702 m)  ?   Head Circumference --   ?  Peak Flow --   ?   Pain Score 05/01/21 1131 10  ?   Pain Loc --   ?   Pain Edu? --   ?   Excl. in [place]? --   ? ?No data found. ? ?Updated Vital Signs ?BP 137/90 (BP Location: Left Arm)   Pulse 99   Temp 97.9 ?F (36.6 ?C) (Oral)   Resp 16   Ht _0  (1.702 m)   Wt 82.2 kg   SpO2 96%   BMI 28.38 kg/m?  ? ?Visual Acuity ?Right Eye Distance:   ?Left Eye Distance:   ?Bilateral Distance:   ? ?Right Eye Near:   ?Left Eye Near:    ?Bilateral Near:    ? ?Physical Exam ?Vitals and nursing note reviewed.  ?Constitutional:   ?   General: She is not in acute distress. ?   Appearance: She is well-developed. She is not ill-appearing.  ?Cardiovascular:  ?   Rate and Rhythm: Normal rate and regular rhythm.  ?Pulmonary:  ?   Effort: Pulmonary effort is normal.  ?   Breath sounds: Normal breath sounds.  ?Abdominal:  ?   General: Abdomen is flat. Bowel sounds are normal.  ?   Palpations: Abdomen is soft. There is no shifting dullness.  ?   Tenderness: There is abdominal tenderness in the right lower quadrant. There is guarding. There is no rebound. Negative signs include Murphy's sign and McBurney's sign.  ?   Hernia: No hernia is present.  ?Neurological:  ?   Mental Status: She is alert.  ? ? ? ?UC Treatments / Results  ?Labs ?(all labs ordered are listed, but only abnormal results are displayed) ?Labs Reviewed - No data to display ? ?EKG ? ? ?Radiology ?No results found. ? ?Procedures ?Procedures (including critical care time) ? ?Medications Ordered in UC ?Medications - No data to display ? ?Initial Impression / Assessment and Plan / UC Course  ?I have reviewed the triage vital signs and the nursing notes. ? ?Pertinent labs & imaging results that were available during my care of the patient were reviewed by me and considered in my medical decision making (see chart for  details). ? ?  ? ?1.  Persistent severe abdominal pain: ?Given the severity and duration of the patient's symptoms she is advised to go to the emergency department for further evaluation.  Patient will require ED evaluation an

## 2021-05-06 ENCOUNTER — Ambulatory Visit: Payer: Medicare Other | Admitting: Internal Medicine

## 2021-05-06 ENCOUNTER — Encounter: Payer: Self-pay | Admitting: Dietician

## 2021-05-06 ENCOUNTER — Encounter: Payer: Medicaid Other | Admitting: Dietician

## 2021-05-06 DIAGNOSIS — R809 Proteinuria, unspecified: Secondary | ICD-10-CM

## 2021-05-06 DIAGNOSIS — E876 Hypokalemia: Secondary | ICD-10-CM | POA: Diagnosis not present

## 2021-05-06 DIAGNOSIS — K5909 Other constipation: Secondary | ICD-10-CM | POA: Diagnosis not present

## 2021-05-06 DIAGNOSIS — E1129 Type 2 diabetes mellitus with other diabetic kidney complication: Secondary | ICD-10-CM

## 2021-05-06 DIAGNOSIS — I10 Essential (primary) hypertension: Secondary | ICD-10-CM

## 2021-05-06 MED ORDER — OZEMPIC (0.25 OR 0.5 MG/DOSE) 2 MG/1.5ML ~~LOC~~ SOPN: 0.2500 mg | PEN_INJECTOR | SUBCUTANEOUS | 0 refills | Status: DC

## 2021-05-06 MED ORDER — POLYETHYLENE GLYCOL 3350 17 G PO PACK: 17.0000 g | PACK | Freq: Two times a day (BID) | ORAL | 0 refills | Status: DC

## 2021-05-06 MED ORDER — SENNOSIDES-DOCUSATE SODIUM 8.6-50 MG PO TABS: 2.0000 | ORAL_TABLET | Freq: Two times a day (BID) | ORAL | 0 refills | Status: DC

## 2021-05-06 NOTE — Assessment & Plan Note (Addendum)
Patient was recently hospitalized April 2023 for abdominal pain related to constipation.  CT abdomen revealed stool burden; appendix normal; no structural abnormalities noted.  Throughout hospital course patient received bowel regimen with resolution of symptoms. Since discharge 3 days ago, her last bowel movement was this morning, minimal straining. She is still experiencing some abdominal tenderness intermittently, dull pain specifically after she eats, and she admits to eating greasy foods.  She denies any N/V, fever or chills.  No urinary changes.  On exam, tenderness noted of the upper and lower left quadrant as well as CVA tenderness bilaterally.  Think this is all related to her constipation.  Given negative imaging findings, low suspicion for any other etiology at this time.  Bowel regimen included MiraLAX daily, patient will likely benefit from increasing this bowel regiment. ? ?P: ?-Follow-up in 2-week ?-MiraLAX twice daily and senna 2 tablets twice daily ? ? ?

## 2021-05-06 NOTE — Progress Notes (Signed)
? ?CC: Abdominal tenderness, constipation, hospital follow-up ? ?HPI: ? ?Janice Massey is a 66 y.o. female with a past medical history stated below and presents today for CC listed above.  Patient present with sign language interpreter.  Please see problem based assessment and plan for additional details. ? ?Past Medical History:  ?Diagnosis Date  ? Back pain   ? Cardiac murmur   ? small membranous VSD with fairly loud cardiac murmur (asymptomatic)   ? Chronic PID (chronic pelvic inflammatory disease)   ? Deaf   ? Since age 63  ? Diabetes mellitus 2004  ? History of Doppler ultrasound 06/20/2009  ? Normal, no prior studies for comparison.   ? Hypertension   ? Mute   ? Obese   ? URI (upper respiratory infection)   ? Ventricular septal defect 2004 per echo  ? small membranous VSD  ? ? ?Current Outpatient Medications on File Prior to Visit  ?Medication Sig Dispense Refill  ? Accu-Chek FastClix Lancets MISC 1 Units by Does not apply route daily. Use once a daily in morning to check blood glucose 102 each 3  ? amLODipine (NORVASC) 10 MG tablet Take 1 tablet (10 mg total) by mouth daily. 90 tablet 3  ? aspirin 81 MG chewable tablet Chew 1 tablet (81 mg total) by mouth daily. 30 tablet 3  ? atorvastatin (LIPITOR) 40 MG tablet Take 1 tablet (40 mg total) by mouth daily. (Patient taking differently: Take 40 mg by mouth at bedtime.) 30 tablet 2  ? Blood Glucose Monitoring Suppl (ACCU-CHEK GUIDE) w/Device KIT 1 Units by Does not apply route daily. 1 kit 0  ? cetirizine (ZYRTEC) 10 MG tablet Take 1 tablet (10 mg total) by mouth daily as needed for allergies. 90 tablet 1  ? fluticasone (FLONASE) 50 MCG/ACT nasal spray SHAKE LIQUID AND USE 2 SPRAYS IN EACH NOSTRIL DAILY (Patient taking differently: Place 1 spray into both nostrils daily as needed for allergies.) 16 g 2  ? glucose blood (ACCU-CHEK GUIDE) test strip Use as instructed 100 each 12  ? latanoprost (XALATAN) 0.005 % ophthalmic solution INSTILL 1 DROP IN BOTH EYES  EVERY DAY IN THE EVENING (Patient not taking: Reported on 05/01/2021) 2.5 mL 1  ? losartan-hydrochlorothiazide (HYZAAR) 100-25 MG tablet Take 1 tablet by mouth daily. 90 tablet 1  ? metFORMIN (GLUCOPHAGE) 1000 MG tablet TAKE 1 TABLET(1000 MG) BY MOUTH TWICE DAILY (Patient taking differently: Take 1,000 mg by mouth daily with breakfast.) 180 tablet 1  ? omeprazole (PRILOSEC) 40 MG capsule TAKE 1 CAPSULE(40 MG) BY MOUTH DAILY (Patient taking differently: Take 40 mg by mouth daily.) 90 capsule 2  ? ?No current facility-administered medications on file prior to visit.  ? ? ?Family History  ?Problem Relation Age of Onset  ? Cancer Mother   ?     liver  ? Heart attack Mother 39  ? Hypertension Father   ? Diabetes Father   ? Heart attack Father   ? CAD Father 20  ? Diabetes Paternal Aunt   ? Diabetes Maternal Grandfather   ? Breast cancer Cousin   ? Colon cancer Neg Hx   ? Endometrial cancer Neg Hx   ? Pancreatic cancer Neg Hx   ? Prostate cancer Neg Hx   ? Ovarian cancer Neg Hx   ? ? ?Social History  ? ?Socioeconomic History  ? Marital status: Single  ?  Spouse name: Not on file  ? Number of children: 1  ? Years of education: Not  on file  ? Highest education level: Not on file  ?Occupational History  ?  Employer: UNEMPLOYED  ?Tobacco Use  ? Smoking status: Never  ? Smokeless tobacco: Never  ?Vaping Use  ? Vaping Use: Never used  ?Substance and Sexual Activity  ? Alcohol use: No  ?  Alcohol/week: 0.0 standard drinks  ? Drug use: No  ? Sexual activity: Not Currently  ?  Birth control/protection: Surgical  ?Other Topics Concern  ? Not on file  ?Social History Narrative  ? Not on file  ? ?Social Determinants of Health  ? ?Financial Resource Strain: Not on file  ?Food Insecurity: Not on file  ?Transportation Needs: Not on file  ?Physical Activity: Not on file  ?Stress: Not on file  ?Social Connections: Not on file  ?Intimate Partner Violence: Not on file  ? ? ?Review of Systems: ?ROS negative except for what is noted on the  assessment and plan. ? ?Vitals:  ? 05/06/21 0922  ?BP: 126/81  ?Pulse: 99  ?Temp: 98.4 ?F (36.9 ?C)  ?TempSrc: Oral  ?SpO2: 99%  ?Weight: 177 lb 11.2 oz (80.6 kg)  ?Height: _0  (1.702 m)  ? ? ? ?Physical Exam: ?Constitutional: well-appearing overweight woman sitting in the chair, in no acute distress ?HENT: normocephalic atraumatic, mucous membranes moist ?Eyes: conjunctiva non-erythematous ?Neck: supple ?Cardiovascular: regular rate and rhythm, no m/r/g ?Pulmonary/Chest: normal work of breathing on room air, lungs clear to auscultation bilaterally ?Abdominal: soft, bowel sounds present, tenderness of the left upper and lower quadrant.  No rebound or guarding.  CVA tenderness noted bilaterally ?MSK: normal bulk and tone ?Neurological: alert & oriented x 3, 5/5 strength in bilateral upper and lower extremities, normal gait ?Skin: warm and dry ?Psych: Normal mood, normal behavior ? ? ?Assessment & Plan:  ? ?See Encounters Tab for problem based charting. ? ?Patient discussed with Dr. Evette Doffing ?Timothy Lasso, M.D. ?[facility] Internal Medicine, PGY-1 ?Pager: [phone], Phone: [phone] ?Date 05/06/2021 Time 1:25 PM ? ?

## 2021-05-06 NOTE — Assessment & Plan Note (Signed)
Patient was recently hospitalized April 2023 and was noted to be hypokalemic at 3.3.  During hospitalization potassium was repleted.  At time of discharge patient was advised to follow-up in the clinic for repeat BMP.  She denies any numbness, tingling, palpitations, however, she endorsed abdominal cramping.  This could be secondary to underlying constipation or possible underlying hypokalemia. ? ?P: ?-Repeat BMP ?

## 2021-05-06 NOTE — Patient Instructions (Signed)
Thank you for your visit today! ? ?We talked about:  ? ? ? ? ?Goals to work on:  ? ? ? ?Please feel free to call me anytime. ? Butch Penny ?[phone] ? ?

## 2021-05-06 NOTE — Assessment & Plan Note (Addendum)
Home medications include amlodipine 10 mg and losartan-HCTZ 100-25 mg daily.  Blood pressures well controlled on this regimen. ? ? ?P: ?-Continue current regimen ?

## 2021-05-06 NOTE — Progress Notes (Signed)
Chart opened in error. Patient cancelled this visit.  ?Janice Massey, RD ?05/06/2021 ?10:36 AM.  ?

## 2021-05-06 NOTE — Patient Instructions (Addendum)
Thank you, [name] for allowing [place] to provide your care today. Today we discussed controlling your diabetes and stomach tenderness.   ? ?I have ordered the following labs for you: ? ?Lab Orders    ?     BMP8+Anion Gap     ? ?Tests ordered today: ? ?None ? ?Referrals ordered today:  ? ?Referral Orders  ?No referral(s) requested today  ?  ? ?I have ordered the following medication/changed the following medications:  ? ?Stop the following medications: ?Medications Discontinued During This Encounter  ?Medication Reason  ? sitaGLIPtin (JANUVIA) 100 MG tablet   ? senna-docusate (SENOKOT-S) 8.6-50 MG tablet   ? polyethylene glycol (MIRALAX) 17 g packet   ?  ? ?Start the following medications: ?Meds ordered this encounter  ?Medications  ? Semaglutide,0.25 or 0.'5MG'$ /DOS, (OZEMPIC, 0.25 OR 0.5 MG/DOSE,) 2 MG/1.5ML SOPN  ?  Sig: Inject 0.25 mg into the skin once a week.  ?  Dispense:  2.25 mL  ?  Refill:  0  ? senna-docusate (SENOKOT-S) 8.6-50 MG tablet  ?  Sig: Take 2 tablets by mouth 2 (two) times daily.  ?  Dispense:  30 tablet  ?  Refill:  0  ? polyethylene glycol (MIRALAX) 17 g packet  ?  Sig: Take 17 g by mouth 2 (two) times daily.  ?  Dispense:  14 each  ?  Refill:  0  ?  ? ?Follow up:  2 weeks   ? ?Remember: Remember to stop taking Januvia 2 days prior to beginning the [place].  Also stop taking the Jardiance for now.  I am also going to increase your MiraLAX and senna to help you have daily bowel movements. ? ?Should you have any questions or concerns please call the internal medicine clinic at [phone].   ? ?Timothy Lasso, MD ?[place] ?  ?

## 2021-05-06 NOTE — Assessment & Plan Note (Addendum)
Last A1c obtained April 2023 was elevated at 11.5%.  During recent hospitalization May 01, 2021 there was concern for dehydration secondary to Jardiance resulting in constipation.  At time of discharge, patient was instructed to discontinue Jardiance.  She has poorly controlled diabetes and would likely benefit from a GLP-1.  Other home medications include metformin 1000 mg and Januvia 100 mg.  Today patient states that she continued to take Jardiance.  She was instructed to discontinue use at this time.  Patient will likely benefit from initiation of Ozempic.  Ozempic and Januvia have interaction and patient was informed to discontinue Januvia at least 2 to 3 days prior to beginning Ozempic.  She acknowledges and agrees.  Once she begins using Ozempic and there is improvement in her A1c, consider restarting Jardiance. ? ?P: ?-Start Ozempic 0.25 mg weekly ?-Hold Januvia and Jardiance ?-Follow-up in 2 weeks ?

## 2021-05-07 LAB — BMP8+ANION GAP
Anion Gap: 19 mmol/L — ABNORMAL HIGH (ref 10.0–18.0)
BUN/Creatinine Ratio: 15 (ref 12–28)
BUN: 13 mg/dL (ref 8–27)
CO2: 22 mmol/L (ref 20–29)
Calcium: 9.8 mg/dL (ref 8.7–10.3)
Chloride: 100 mmol/L (ref 96–106)
Creatinine, Ser: 0.89 mg/dL (ref 0.57–1.00)
Glucose: 243 mg/dL — ABNORMAL HIGH (ref 70–99)
Potassium: 3.8 mmol/L (ref 3.5–5.2)
Sodium: 141 mmol/L (ref 134–144)
eGFR: 72 mL/min/{1.73_m2} (ref >59)

## 2021-05-08 NOTE — Progress Notes (Signed)
Internal Medicine Clinic Attending ? ?Case discussed with Dr. Ariwodo  At the time of the visit.  We reviewed the resident?s history and exam and pertinent patient test results.  I agree with the assessment, diagnosis, and plan of care documented in the resident?s note.  ?

## 2021-05-12 ENCOUNTER — Ambulatory Visit: Payer: Medicaid Other

## 2021-05-20 ENCOUNTER — Encounter: Payer: Self-pay | Admitting: Internal Medicine

## 2021-05-20 ENCOUNTER — Other Ambulatory Visit: Payer: Self-pay

## 2021-05-20 ENCOUNTER — Ambulatory Visit (INDEPENDENT_AMBULATORY_CARE_PROVIDER_SITE_OTHER): Payer: Medicare Other | Admitting: Internal Medicine

## 2021-05-20 VITALS — BP 134/84 | HR 96 | Temp 97.5°F | Ht 67.0 in | Wt 179.0 lb

## 2021-05-20 DIAGNOSIS — R809 Proteinuria, unspecified: Secondary | ICD-10-CM

## 2021-05-20 DIAGNOSIS — E1129 Type 2 diabetes mellitus with other diabetic kidney complication: Secondary | ICD-10-CM | POA: Diagnosis not present

## 2021-05-20 DIAGNOSIS — I10 Essential (primary) hypertension: Secondary | ICD-10-CM

## 2021-05-20 DIAGNOSIS — R1031 Right lower quadrant pain: Secondary | ICD-10-CM

## 2021-05-20 DIAGNOSIS — Z7984 Long term (current) use of oral hypoglycemic drugs: Secondary | ICD-10-CM

## 2021-05-20 DIAGNOSIS — J302 Other seasonal allergic rhinitis: Secondary | ICD-10-CM

## 2021-05-20 MED ORDER — CETIRIZINE HCL 10 MG PO TABS: 10.0000 mg | ORAL_TABLET | Freq: Every day | ORAL | 1 refills | Status: DC | PRN

## 2021-05-20 MED ORDER — LOSARTAN POTASSIUM-HCTZ 100-25 MG PO TABS: 1.0000 | ORAL_TABLET | Freq: Every day | ORAL | 1 refills | Status: DC

## 2021-05-20 MED ORDER — EMPAGLIFLOZIN 10 MG PO TABS: 10.0000 mg | ORAL_TABLET | Freq: Every day | ORAL | 0 refills | Status: DC

## 2021-05-20 NOTE — Patient Instructions (Addendum)
Thank you, Ms.LouAnn Bartosik for allowing [place] to provide your care today. Today we discussed stomach pain and diabetes..   ? ?I have ordered the following labs for you: ? ?Lab Orders  ?No laboratory test(s) ordered today  ?  ? ?Tests ordered today: ? ?None  ? ?Referrals ordered today:  ? ?Referral Orders    ?     Ambulatory referral to Gastroenterology    ?  ? ?I have ordered the following medication/changed the following medications:  ? ?Stop the following medications: ? ?Ozempic 0.'25mg'$  weekly ? ?Start the following medications: ?Meds ordered this encounter  ?Medications  ? empagliflozin (JARDIANCE) 10 MG TABS tablet  ?  Sig: Take 1 tablet (10 mg total) by mouth daily before breakfast.  ?  Dispense:  90 tablet  ?  Refill:  0  ?  ? ?Follow up: 2 weeks  ? ?Remember: resume Jardiance '10mg'$  daily; continue bowel regimen; follow up with stomach doctor. Do not take Ozempic ? ?Should you have any questions or concerns please call the internal medicine clinic at [phone].   ? ?Timothy Lasso, MD ?[place] ?  ?

## 2021-05-20 NOTE — Assessment & Plan Note (Addendum)
Patient was previously hospitalized April 2023 for abdominal pain.  CT abdomin significant for stool burden.  Abdominal pain thought to be due to constipation.  Patient was discharged on bowel regimen and then followed up in the clinic 1 week later.  During that office visit she endorsed some improvement in her abdominal pain, yet continued to experience constipation.  Bowel regimen was increased to Senokot 2 tablets twice daily and MiraLAX daily.  Today, patient states since increase in bowel regimen she has had bowel movements every other day.  However, she endorses persistent right lower quadrant abdominal pain.  She denies radiation of the pain.  Denies fever, chills, N/V, diarrhea, or hematochezia.  Nothing alleviates the pain.  And nothing she notices exacerbates the pain.  She notices the pain is worse at night, described as a cramping sensation.  During the day she is able to function normally without noticing any abdominal discomfort.  The abdominal pain is not triggered before or after meals.  On exam, no skin changes or rash noted of the abdomen.  Tenderness noted of the RLQ and RUQ.  No rebound tenderness, no rigidity, no peritoneal signs.  Murphy sign negative.  Normal bowel sounds on auscultation.  She denies acid reflux or history of acid reflux disease. She has a surgical history of cholecystectomy and tubal ligation.  No other abdominal surgeries.Colonoscopy obtained in 2020 revealed localized area of mildly inflamed mucosa at the ileocecal valve and biopsies were obtained.  Small nonbleeding internal hemorrhoids were found during retroflexion.  The exam was otherwise without abnormality. Results of biopsy negative for features of celiac disease.  Giardia lamblia organisms not present.  Negative for H. pylori agonism's on IP stain. Negative for lymphocytic/collagenous ileocolitis, active ileocolitis or dysplasia.  Patient has family history of liver cancer in her mother.  Patient does not consume  alcohol and is not a smoker; recent labs from hospitalization showed unremarkable LFTs.  Imaging of the abdomen was negative for any structural abnormalities.  Etiology unclear at this time.  Differentials include IBS, which is a diagnosis of exclusion.  Low suspicion for IBD as patient denies any episodes of hematochezia, CT of abdomen was negative for any structural abnormalities and recent colonoscopy was negative.  Low suspicion for appendicitis, as it is an acute syndrome; patient was stable, vitals stable and in no acute distress during office visit.  CT of the abdomen showed a normal appendix.  Patient would likely benefit from gastroenterology referral for further evaluation. ? ?P: ?-Continue bowel regimen Senokot 2 tablets twice daily and MiraLAX daily ?-GI referral ?-Follow-up in 1 month ?

## 2021-05-20 NOTE — Assessment & Plan Note (Addendum)
On prior office visit, patient was seen due to her recent hospitalization.  During that hospitalization Jardiance was discontinued as it was thought to be causing the patient to be dehydrated resulting in constipation.  Hemoglobin A1c was 11.7%.  On prior office visit, patient was started on Ozempic 0.25 mg daily, she has since picked up this medication and was set to begin Thursday, 05/21/2021.  However, on today's office visit she complains of persistent right lower quadrant abdominal pain.  Given current symptoms, she was advised to not to begin Ozempic as it could exacerbate her current abdominal symptoms.  To avoid her not being on any antihyperglycemic agents, Jardiance was restarted today. She was instructed to drink lots of fluids while using this medication to remain hydrated and continue bowel regiment.  Discussion was had at great lengths with Ms. Lafontant regarding her having to likely begin using insulin to control her diabetes.  As Jardiance A1c benefit would reduce A1c by only 1 to 2%.  Even then, patient will still be in an uncontrolled diabetic range.  We are hopeful that once she follows up with her gastroenterologist, we can initiate Ozempic as a second agent, as well as insulin.  Patient has been on metformin in the past and did not tolerate due to GI symptoms. ? ?P: ?-Jardiance 10 mg daily ?-Hold Ozempic ?-Consider initiating daily Lantus next visit ?

## 2021-05-20 NOTE — Progress Notes (Signed)
? ?CC: RLQ abdominal pain ? ?HPI: ? ?Janice Massey is a 66 y.o. female with a past medical history stated below and presents today for cc listed above. Please see problem based assessment and plan for additional details. ? ?Past Medical History:  ?Diagnosis Date  ? Back pain   ? Cardiac murmur   ? small membranous VSD with fairly loud cardiac murmur (asymptomatic)   ? Chronic PID (chronic pelvic inflammatory disease)   ? Deaf   ? Since age 11  ? Diabetes mellitus 2004  ? History of Doppler ultrasound 06/20/2009  ? Normal, no prior studies for comparison.   ? Hypertension   ? Mute   ? Obese   ? URI (upper respiratory infection)   ? Ventricular septal defect 2004 per echo  ? small membranous VSD  ? ? ?Current Outpatient Medications on File Prior to Visit  ?Medication Sig Dispense Refill  ? Accu-Chek FastClix Lancets MISC 1 Units by Does not apply route daily. Use once a daily in morning to check blood glucose 102 each 3  ? amLODipine (NORVASC) 10 MG tablet Take 1 tablet (10 mg total) by mouth daily. 90 tablet 3  ? aspirin 81 MG chewable tablet Chew 1 tablet (81 mg total) by mouth daily. 30 tablet 3  ? atorvastatin (LIPITOR) 40 MG tablet Take 1 tablet (40 mg total) by mouth daily. (Patient taking differently: Take 40 mg by mouth at bedtime.) 30 tablet 2  ? Blood Glucose Monitoring Suppl (ACCU-CHEK GUIDE) w/Device KIT 1 Units by Does not apply route daily. 1 kit 0  ? fluticasone (FLONASE) 50 MCG/ACT nasal spray SHAKE LIQUID AND USE 2 SPRAYS IN EACH NOSTRIL DAILY (Patient taking differently: Place 1 spray into both nostrils daily as needed for allergies.) 16 g 2  ? glucose blood (ACCU-CHEK GUIDE) test strip Use as instructed 100 each 12  ? latanoprost (XALATAN) 0.005 % ophthalmic solution INSTILL 1 DROP IN BOTH EYES EVERY DAY IN THE EVENING (Patient not taking: Reported on 05/01/2021) 2.5 mL 1  ? metFORMIN (GLUCOPHAGE) 1000 MG tablet TAKE 1 TABLET(1000 MG) BY MOUTH TWICE DAILY (Patient taking differently: Take 1,000 mg  by mouth daily with breakfast.) 180 tablet 1  ? omeprazole (PRILOSEC) 40 MG capsule TAKE 1 CAPSULE(40 MG) BY MOUTH DAILY (Patient taking differently: Take 40 mg by mouth daily.) 90 capsule 2  ? polyethylene glycol (MIRALAX) 17 g packet Take 17 g by mouth 2 (two) times daily. 14 each 0  ? Semaglutide,0.25 or 0.5MG/DOS, (OZEMPIC, 0.25 OR 0.5 MG/DOSE,) 2 MG/1.5ML SOPN Inject 0.25 mg into the skin once a week. 2.25 mL 0  ? senna-docusate (SENOKOT-S) 8.6-50 MG tablet Take 2 tablets by mouth 2 (two) times daily. 30 tablet 0  ? ?No current facility-administered medications on file prior to visit.  ? ? ?Family History  ?Problem Relation Age of Onset  ? Cancer Mother   ?     liver  ? Heart attack Mother 64  ? Hypertension Father   ? Diabetes Father   ? Heart attack Father   ? CAD Father 89  ? Diabetes Paternal Aunt   ? Diabetes Maternal Grandfather   ? Breast cancer Cousin   ? Colon cancer Neg Hx   ? Endometrial cancer Neg Hx   ? Pancreatic cancer Neg Hx   ? Prostate cancer Neg Hx   ? Ovarian cancer Neg Hx   ? ? ?Social History  ? ?Socioeconomic History  ? Marital status: Single  ?  Spouse name:  Not on file  ? Number of children: 1  ? Years of education: Not on file  ? Highest education level: Not on file  ?Occupational History  ?  Employer: UNEMPLOYED  ?Tobacco Use  ? Smoking status: Never  ? Smokeless tobacco: Never  ?Vaping Use  ? Vaping Use: Never used  ?Substance and Sexual Activity  ? Alcohol use: No  ?  Alcohol/week: 0.0 standard drinks  ? Drug use: No  ? Sexual activity: Not Currently  ?  Birth control/protection: Surgical  ?Other Topics Concern  ? Not on file  ?Social History Narrative  ? Not on file  ? ?Social Determinants of Health  ? ?Financial Resource Strain: Not on file  ?Food Insecurity: Not on file  ?Transportation Needs: Not on file  ?Physical Activity: Not on file  ?Stress: Not on file  ?Social Connections: Not on file  ?Intimate Partner Violence: Not on file  ? ? ?Review of Systems: ?ROS negative except  for what is noted on the assessment and plan. ? ?Vitals:  ? 05/20/21 1004 05/20/21 1014  ?BP: 139/88 134/84  ?Pulse: 96 96  ?Temp: (!) 97.5 ?F (36.4 ?C)   ?TempSrc: Oral   ?SpO2: 98%   ?Weight: 179 lb (81.2 kg)   ?Height: _0  (1.702 m)   ? ? ? ?Physical Exam: ?Constitutional: normal-appearing women sitting in the chair, in no acute distress ?HENT: normocephalic atraumatic, mucous membranes moist ?Eyes: conjunctiva non-erythematous ?Neck: supple ?Cardiovascular: regular rate and rhythm, no m/r/g ?Pulmonary/Chest: normal work of breathing on room air, lungs clear to auscultation bilaterally ?Abdominal: soft, non-distended, tender to palpation of RLQ and RUQ; murphy sign negative; no rebound tenderness, no rigidity. No peritoneal signs; bowel sounds present; NO skin changes or rash ?MSK: normal bulk and tone ?Neurological: alert & oriented x 3, 5/5 strength in bilateral upper and lower extremities, normal gait ?Skin: warm and dry ?Psych: Normal mood, normal behavior ? ? ?Assessment & Plan:  ? ?See Encounters Tab for problem based charting. ? ?Patient discussed with Dr. Evette Doffing ?Timothy Lasso, M.D. ?[facility] Internal Medicine, PGY-1 ?Pager: [phone], Phone: [phone] ?Date 05/20/2021 Time 1:34 PM  ?

## 2021-05-21 NOTE — Progress Notes (Signed)
Internal Medicine Clinic Attending ? ?Case discussed with Dr. Ariwodo  At the time of the visit.  We reviewed the resident?s history and exam and pertinent patient test results.  I agree with the assessment, diagnosis, and plan of care documented in the resident?s note.  ?

## 2021-05-28 ENCOUNTER — Other Ambulatory Visit: Payer: Self-pay | Admitting: Student

## 2021-05-28 DIAGNOSIS — I10 Essential (primary) hypertension: Secondary | ICD-10-CM

## 2021-05-29 ENCOUNTER — Ambulatory Visit: Payer: Medicaid Other

## 2021-06-03 ENCOUNTER — Ambulatory Visit (INDEPENDENT_AMBULATORY_CARE_PROVIDER_SITE_OTHER): Payer: Medicaid Other | Admitting: Internal Medicine

## 2021-06-03 ENCOUNTER — Encounter: Payer: Self-pay | Admitting: Internal Medicine

## 2021-06-03 ENCOUNTER — Other Ambulatory Visit: Payer: Self-pay

## 2021-06-03 DIAGNOSIS — R809 Proteinuria, unspecified: Secondary | ICD-10-CM | POA: Diagnosis not present

## 2021-06-03 DIAGNOSIS — I10 Essential (primary) hypertension: Secondary | ICD-10-CM | POA: Diagnosis not present

## 2021-06-03 DIAGNOSIS — K5909 Other constipation: Secondary | ICD-10-CM

## 2021-06-03 DIAGNOSIS — R1011 Right upper quadrant pain: Secondary | ICD-10-CM

## 2021-06-03 DIAGNOSIS — E1129 Type 2 diabetes mellitus with other diabetic kidney complication: Secondary | ICD-10-CM

## 2021-06-03 DIAGNOSIS — J302 Other seasonal allergic rhinitis: Secondary | ICD-10-CM

## 2021-06-03 MED ORDER — CETIRIZINE HCL 10 MG PO TABS: 10.0000 mg | ORAL_TABLET | Freq: Every day | ORAL | 1 refills | Status: DC | PRN

## 2021-06-03 NOTE — Assessment & Plan Note (Addendum)
On prior office visit patient was experiencing abdominal pain and constipation.  We decided to hold Ozempic until symptoms resolve.  Otherwise she is compliant with Jardiance 10 mg daily.  Most recent A1c was 11.7%.  Discussion was had regarding initiating insulin due to her elevated A1c.  She was not agreeable to this plan and states that a family member is on insulin and she does not like the idea of daily injections.  However, she is agreeable to initiating Ozempic weekly injection.  Although, Ozempic decreases A1c 1 to 2%, she needs additional antihyperglycemic agent to control her diabetes.  She was counseled extensively on the importance of proper diet.  We will assess her tolerance to Ozempic and repeat A1c in 3 months.  P: -Follow-up in 4 weeks to assess tolerance with Ozempic; titrate up as tolerated -Repeat A1c in 3 months -Continue Jardiance 10 mg daily -Consider additional antihyperglycemic agents to control diabetes

## 2021-06-03 NOTE — Assessment & Plan Note (Signed)
Since increasing bowel regiment on prior office visit, patient now has regular bowel movements.  Constipation resolved.

## 2021-06-03 NOTE — Assessment & Plan Note (Signed)
Since prior office visit patient has now been having regular bowel movements.  Abdominal pain resolved.  She was previously scheduled for GI appointment, symptoms have since resolved and she has canceled this appointment.

## 2021-06-03 NOTE — Patient Instructions (Signed)
Thank you, Ms.LouAnn Segers for allowing [place] to provide your care today.    I have ordered the following medication/changed the following medications:    Start the following medications: Meds ordered this encounter  Medications   cetirizine (ZYRTEC) 10 MG tablet    Sig: Take 1 tablet (10 mg total) by mouth daily as needed for allergies.    Dispense:  90 tablet    Refill:  1     Follow up: 4 weeks   GI appointment information:  Georgian Co, MD Address: [address], [place], [place] [address] Phone: [phone]    Should you have any questions or concerns please call the internal medicine clinic at [phone].    Timothy Lasso, MD [place]

## 2021-06-03 NOTE — Progress Notes (Signed)
CC: Diabetes F/U, abd pain follow up; constipation follow up  HPI:  Janice Massey is a 66 y.o. female with a past medical history stated below and presents today for cc listed above. Sign language interpreter present. Please see problem based assessment and plan for additional details.  Past Medical History:  Diagnosis Date   Back pain    Cardiac murmur    small membranous VSD with fairly loud cardiac murmur (asymptomatic)    Chronic PID (chronic pelvic inflammatory disease)    Deaf    Since age 74   Diabetes mellitus 2004   History of Doppler ultrasound 06/20/2009   Normal, no prior studies for comparison.    Hypertension    Mute    Obese    URI (upper respiratory infection)    Ventricular septal defect 2004 per echo   small membranous VSD    Current Outpatient Medications on File Prior to Visit  Medication Sig Dispense Refill   Accu-Chek FastClix Lancets MISC 1 Units by Does not apply route daily. Use once a daily in morning to check blood glucose 102 each 3   amLODipine (NORVASC) 10 MG tablet Take 1 tablet (10 mg total) by mouth daily. 90 tablet 3   aspirin 81 MG chewable tablet Chew 1 tablet (81 mg total) by mouth daily. 30 tablet 3   atorvastatin (LIPITOR) 40 MG tablet Take 1 tablet (40 mg total) by mouth daily. (Patient taking differently: Take 40 mg by mouth at bedtime.) 30 tablet 2   Blood Glucose Monitoring Suppl (ACCU-CHEK GUIDE) w/Device KIT 1 Units by Does not apply route daily. 1 kit 0   empagliflozin (JARDIANCE) 10 MG TABS tablet Take 1 tablet (10 mg total) by mouth daily before breakfast. 90 tablet 0   fluticasone (FLONASE) 50 MCG/ACT nasal spray SHAKE LIQUID AND USE 2 SPRAYS IN EACH NOSTRIL DAILY (Patient taking differently: Place 1 spray into both nostrils daily as needed for allergies.) 16 g 2   glucose blood (ACCU-CHEK GUIDE) test strip Use as instructed 100 each 12   latanoprost (XALATAN) 0.005 % ophthalmic solution INSTILL 1 DROP IN BOTH EYES EVERY DAY IN  THE EVENING (Patient not taking: Reported on 05/01/2021) 2.5 mL 1   losartan-hydrochlorothiazide (HYZAAR) 100-25 MG tablet Take 1 tablet by mouth daily. 90 tablet 1   metFORMIN (GLUCOPHAGE) 1000 MG tablet TAKE 1 TABLET(1000 MG) BY MOUTH TWICE DAILY (Patient taking differently: Take 1,000 mg by mouth daily with breakfast.) 180 tablet 1   omeprazole (PRILOSEC) 40 MG capsule TAKE 1 CAPSULE(40 MG) BY MOUTH DAILY (Patient taking differently: Take 40 mg by mouth daily.) 90 capsule 2   polyethylene glycol (MIRALAX) 17 g packet Take 17 g by mouth 2 (two) times daily. 14 each 0   Semaglutide,0.25 or 0.5MG/DOS, (OZEMPIC, 0.25 OR 0.5 MG/DOSE,) 2 MG/1.5ML SOPN Inject 0.25 mg into the skin once a week. 2.25 mL 0   senna-docusate (SENOKOT-S) 8.6-50 MG tablet Take 2 tablets by mouth 2 (two) times daily. 30 tablet 0   No current facility-administered medications on file prior to visit.    Family History  Problem Relation Age of Onset   Cancer Mother        liver   Heart attack Mother 54   Hypertension Father    Diabetes Father    Heart attack Father    CAD Father 59   Diabetes Paternal Aunt    Diabetes Maternal Grandfather    Breast cancer Cousin    Colon cancer Neg Hx  Endometrial cancer Neg Hx    Pancreatic cancer Neg Hx    Prostate cancer Neg Hx    Ovarian cancer Neg Hx     Social History   Socioeconomic History   Marital status: Single    Spouse name: Not on file   Number of children: 1   Years of education: Not on file   Highest education level: Not on file  Occupational History    Employer: UNEMPLOYED  Tobacco Use   Smoking status: Never   Smokeless tobacco: Never  Vaping Use   Vaping Use: Never used  Substance and Sexual Activity   Alcohol use: No    Alcohol/week: 0.0 standard drinks   Drug use: No   Sexual activity: Not Currently    Birth control/protection: Surgical  Other Topics Concern   Not on file  Social History Narrative   Not on file   Social Determinants of  Health   Financial Resource Strain: Not on file  Food Insecurity: Not on file  Transportation Needs: Not on file  Physical Activity: Not on file  Stress: Not on file  Social Connections: Not on file  Intimate Partner Violence: Not on file    Review of Systems: ROS negative except for what is noted on the assessment and plan.  Vitals:   06/03/21 1417  BP: 128/86  Pulse: 79  Temp: 98 F (36.7 C)  TempSrc: Oral  SpO2: 98%  Weight: 176 lb (79.8 kg)  Height: '5\' 7"'  (1.702 m)     Physical Exam: Constitutional: well-appearing elderly woman sitting in the chair, in no acute distress HENT: normocephalic atraumatic, mucous membranes moist Eyes: conjunctiva non-erythematous Neck: supple Cardiovascular: regular rate and rhythm, no m/r/g Pulmonary/Chest: normal work of breathing on room air, lungs clear to auscultation bilaterally Abdominal: soft, non-tender, non-distended MSK: normal bulk and tone Neurological: alert & oriented x 3, 5/5 strength in bilateral upper and lower extremities, normal gait Skin: warm and dry Psych: Normal mood,normal behavior   Assessment & Plan:   See Encounters Tab for problem based charting.  Patient discussed with Dr. Pecolia Ades, M.D. [facility], PGY-1 Pager: [phone], Phone: [phone] Date 06/03/2021 Time 4:22 PM

## 2021-06-03 NOTE — Assessment & Plan Note (Signed)
Home medication include amlodipine 10 mg and losartan-HCTZ 100-25 mg daily.  Blood pressure well controlled on this regimen.  BP at goal today 128/86.   P: -Continue current regimen

## 2021-06-08 NOTE — Progress Notes (Signed)
Internal Medicine Clinic Attending ? ?Case discussed with Dr. Ariwodo  At the time of the visit.  We reviewed the resident?s history and exam and pertinent patient test results.  I agree with the assessment, diagnosis, and plan of care documented in the resident?s note.  ?

## 2021-06-10 ENCOUNTER — Ambulatory Visit: Payer: Medicaid Other

## 2021-06-10 ENCOUNTER — Ambulatory Visit
Admission: RE | Admit: 2021-06-10 | Discharge: 2021-06-10 | Disposition: A | Discharge status: Home / Self Care | Payer: Medicaid Other | Source: Ambulatory Visit | Attending: Internal Medicine | Admitting: Internal Medicine

## 2021-06-12 ENCOUNTER — Ambulatory Visit: Payer: Medicaid Other | Admitting: Internal Medicine

## 2021-06-25 ENCOUNTER — Other Ambulatory Visit: Payer: Self-pay | Admitting: Internal Medicine

## 2021-06-26 ENCOUNTER — Other Ambulatory Visit: Payer: Self-pay | Admitting: Internal Medicine

## 2021-06-30 ENCOUNTER — Other Ambulatory Visit: Payer: Self-pay

## 2021-06-30 DIAGNOSIS — K219 Gastro-esophageal reflux disease without esophagitis: Secondary | ICD-10-CM

## 2021-07-01 NOTE — Telephone Encounter (Signed)
Per chart, pt has an appt tomorrow 6/29 with Dr Humphrey Rolls.

## 2021-07-01 NOTE — Telephone Encounter (Signed)
Will refill ozempic, can we please schedule for a follow up appointment for her diabetes and up-titrating her ozempic?   Thank you!

## 2021-07-02 ENCOUNTER — Encounter: Payer: Self-pay | Admitting: Internal Medicine

## 2021-07-02 ENCOUNTER — Ambulatory Visit (INDEPENDENT_AMBULATORY_CARE_PROVIDER_SITE_OTHER): Payer: Medicare Other | Admitting: Internal Medicine

## 2021-07-02 VITALS — BP 137/87 | HR 100 | Temp 98.0°F | Ht 67.0 in | Wt 176.3 lb

## 2021-07-02 DIAGNOSIS — I10 Essential (primary) hypertension: Secondary | ICD-10-CM | POA: Diagnosis not present

## 2021-07-02 DIAGNOSIS — E1129 Type 2 diabetes mellitus with other diabetic kidney complication: Secondary | ICD-10-CM | POA: Diagnosis present

## 2021-07-02 DIAGNOSIS — J302 Other seasonal allergic rhinitis: Secondary | ICD-10-CM

## 2021-07-02 DIAGNOSIS — R809 Proteinuria, unspecified: Secondary | ICD-10-CM | POA: Diagnosis not present

## 2021-07-02 DIAGNOSIS — Z7984 Long term (current) use of oral hypoglycemic drugs: Secondary | ICD-10-CM

## 2021-07-02 LAB — POCT GLYCOSYLATED HEMOGLOBIN (HGB A1C): Hemoglobin A1C: 9.1 % — AB (ref 4.0–5.6)

## 2021-07-02 LAB — GLUCOSE, CAPILLARY: Glucose-Capillary: 128 mg/dL — ABNORMAL HIGH (ref 70–99)

## 2021-07-02 MED ORDER — SYNJARDY 5-1000 MG PO TABS: 1.0000 | ORAL_TABLET | Freq: Two times a day (BID) | ORAL | 3 refills | Status: DC

## 2021-07-02 MED ORDER — SEMAGLUTIDE(0.25 OR 0.5MG/DOS) 2 MG/3ML ~~LOC~~ SOPN: 0.5000 mg | PEN_INJECTOR | SUBCUTANEOUS | 0 refills | Status: AC

## 2021-07-02 MED ORDER — CETIRIZINE HCL 10 MG PO TABS: 10.0000 mg | ORAL_TABLET | Freq: Every day | ORAL | 1 refills | Status: DC | PRN

## 2021-07-02 NOTE — Patient Instructions (Addendum)
[name], it was a pleasure seeing you today! You endorsed feeling well today. Below are some of the things we talked about this visit. We look forward to seeing you in the follow up appointment!  Today we discussed: You came here to follow-up on your diabetes.  You were started on Ozempic and you are tolerating that well.  We will increase that to 0.5 mg every week for 4 weeks and if you are tolerating that well then we will increase it to by 0.5 mg every month until we get to 2 mg a week.  We will replace your metformin and Jardiance with Synjardy.  Please take this medicine 2 times a day. Please continue taking your blood pressure medications.   I have ordered the following labs today:  Lab Orders         Glucose, capillary         POC Hbg A1C       Referrals ordered today:   Referral Orders  No referral(s) requested today     I have ordered the following medication/changed the following medications:   Stop the following medications: Medications Discontinued During This Encounter  Medication Reason   metFORMIN (GLUCOPHAGE) 1000 MG tablet Change in therapy   empagliflozin (JARDIANCE) 10 MG TABS tablet Discontinued by provider   Semaglutide,0.25 or 0.'5MG'$ /DOS, (OZEMPIC, 0.25 OR 0.5 MG/DOSE,) 2 MG/3ML SOPN Discontinued by provider     Start the following medications: Meds ordered this encounter  Medications   Empagliflozin-metFORMIN HCl (SYNJARDY) 05-998 MG TABS    Sig: Take 1 tablet by mouth 2 (two) times daily.    Dispense:  120 tablet    Refill:  3   Semaglutide,0.25 or 0.'5MG'$ /DOS, 2 MG/3ML SOPN    Sig: Inject 0.5 mg into the skin once a week for 28 days.    Dispense:  3 mL    Refill:  0     Follow-up: 1 month diabetes follow up  Please make sure to arrive 15 minutes prior to your next appointment. If you arrive late, you may be asked to reschedule.   We look forward to seeing you next time. Please call our clinic at [phone] if you have any questions or  concerns. The best time to call is Monday-Friday from 9am-4pm, but there is someone available 24/7. If after hours or the weekend, call the main hospital number and ask for the Internal Medicine Resident On-Call. If you need medication refills, please notify your pharmacy one week in advance and they will send [place] a request.  Thank you for letting us take part in your care. Wishing you the best!  Thank you, Idamae Schuller, MD

## 2021-07-02 NOTE — Progress Notes (Signed)
CC: Diabetes follow up.   HPI:  Janice Massey is a 66 y.o. with medical history as below presenting to [facility] for Diabetes follow up.   Please see problem-based list for further details, assessments, and plans.  Past Medical History:  Diagnosis Date   Back pain    Cardiac murmur    small membranous VSD with fairly loud cardiac murmur (asymptomatic)    Chronic PID (chronic pelvic inflammatory disease)    Deaf    Since age 89   Diabetes mellitus 2004   History of Doppler ultrasound 06/20/2009   Normal, no prior studies for comparison.    Hypertension    Mute    Obese    URI (upper respiratory infection)    Ventricular septal defect 2004 per echo   small membranous VSD    Current Outpatient Medications (Endocrine & Metabolic):    empagliflozin (JARDIANCE) 10 MG TABS tablet, Take 1 tablet (10 mg total) by mouth daily before breakfast.   metFORMIN (GLUCOPHAGE) 1000 MG tablet, TAKE 1 TABLET(1000 MG) BY MOUTH TWICE DAILY (Patient taking differently: Take 1,000 mg by mouth daily with breakfast.)   Semaglutide,0.25 or 0.5MG/DOS, (OZEMPIC, 0.25 OR 0.5 MG/DOSE,) 2 MG/3ML SOPN, INJECT 0.25MG INTO THE SKIN ONCE A WEEK  Current Outpatient Medications (Cardiovascular):    amLODipine (NORVASC) 10 MG tablet, Take 1 tablet (10 mg total) by mouth daily.   atorvastatin (LIPITOR) 40 MG tablet, Take 1 tablet (40 mg total) by mouth daily. (Patient taking differently: Take 40 mg by mouth at bedtime.)   losartan-hydrochlorothiazide (HYZAAR) 100-25 MG tablet, Take 1 tablet by mouth daily.  Current Outpatient Medications (Respiratory):    cetirizine (ZYRTEC) 10 MG tablet, Take 1 tablet (10 mg total) by mouth daily as needed for allergies.   fluticasone (FLONASE) 50 MCG/ACT nasal spray, SHAKE LIQUID AND USE 2 SPRAYS IN EACH NOSTRIL DAILY (Patient taking differently: Place 1 spray into both nostrils daily as needed for allergies.)  Current Outpatient Medications (Analgesics):    aspirin 81 MG  chewable tablet, Chew 1 tablet (81 mg total) by mouth daily.   Current Outpatient Medications (Other):    Accu-Chek FastClix Lancets MISC, 1 Units by Does not apply route daily. Use once a daily in morning to check blood glucose   Blood Glucose Monitoring Suppl (ACCU-CHEK GUIDE) w/Device KIT, 1 Units by Does not apply route daily.   glucose blood (ACCU-CHEK GUIDE) test strip, Use as instructed   latanoprost (XALATAN) 0.005 % ophthalmic solution, INSTILL 1 DROP IN BOTH EYES EVERY DAY IN THE EVENING (Patient not taking: Reported on 05/01/2021)   omeprazole (PRILOSEC) 40 MG capsule, TAKE 1 CAPSULE(40 MG) BY MOUTH DAILY (Patient taking differently: Take 40 mg by mouth daily.)   polyethylene glycol (MIRALAX) 17 g packet, Take 17 g by mouth 2 (two) times daily.   senna-docusate (SENOKOT-S) 8.6-50 MG tablet, Take 2 tablets by mouth 2 (two) times daily.  Review of Systems:  Review of system negative unless stated in the problem list or HPI.    Physical Exam:  Vitals:   07/02/21 1106  BP: 137/87  Pulse: 100  Temp: 98 F (36.7 C)  TempSrc: Oral  SpO2: 100%  Weight: 176 lb 4.8 oz (80 kg)  Height: 5' 7" (1.702 m)    Physical Exam General: NAD HENT: NCAT Lungs: CTAB, no wheeze, rhonchi or rales.  Cardiovascular: Normal heart sounds, no r/m/g, 2+ pulses in all extremities. No LE edema Abdomen: No TTP, normal bowel sounds MSK: No asymmetry or  muscle atrophy.  Skin: no lesions noted on exposed skin Neuro: Alert and oriented x4. CN grossly intact Psych: Normal mood and normal affect   Assessment & Plan:   See Encounters Tab for problem based charting.  Patient discussed with Dr. Odella Aquas, MD

## 2021-07-03 MED ORDER — OMEPRAZOLE 40 MG PO CPDR: 40.0000 mg | DELAYED_RELEASE_CAPSULE | Freq: Every day | ORAL | 0 refills | Status: DC

## 2021-07-04 NOTE — Assessment & Plan Note (Signed)
Patient's BP near goal at 137/87. Home meds include amlodipine 10 mg qd, Hyzaar 100-25 mg qd. She does not check BP at home. We will keep her regimen the same and increase if BP elevated subsequent visits.  -Continue current meds.

## 2021-07-04 NOTE — Assessment & Plan Note (Signed)
Patient's DMII is uncontrolled. A1c 11.5 around 3 months ago. POC A1c today today with A1c 9.1 . Current regimen includes Farxiga 10 mg qd, Metformin 1000 mg qd, Ozempic 0.25 mg weekly. Reports good medication adherence. Improving on current regimen. Tolerating medication without adverse effects. Counseled on the benefits of daily exercise, limiting processed foods and high sugar foods, and weight loss. No hypoglycemic episodes. Denies polydipsia, polyuria, weight loss, lethargy, blurry vision, and changes in sensation. States all her GI issues have resolved.  -We will increase Ozempic 0.50 mg weekly, stop farxiga 10 mg and Metformin 1000 mg qd and start Synjardy 05-998 mg BID.  -CTM CBG's and bring glucometer to next visit  -Continue lifestyle modifications -She is planning to call her eye doctor this week to schedule an appointment.

## 2021-07-16 NOTE — Progress Notes (Signed)
Internal Medicine Clinic Attending  Case discussed with Dr. Khan  at the time of the visit.  We reviewed the resident's history and exam and pertinent patient test results.  I agree with the assessment, diagnosis, and plan of care documented in the resident's note.  

## 2021-08-10 ENCOUNTER — Encounter: Payer: Self-pay | Admitting: Student

## 2021-08-10 ENCOUNTER — Ambulatory Visit (INDEPENDENT_AMBULATORY_CARE_PROVIDER_SITE_OTHER): Payer: Medicare Other | Admitting: Student

## 2021-08-10 DIAGNOSIS — Z7985 Long-term (current) use of injectable non-insulin antidiabetic drugs: Secondary | ICD-10-CM

## 2021-08-10 DIAGNOSIS — R809 Proteinuria, unspecified: Secondary | ICD-10-CM

## 2021-08-10 DIAGNOSIS — I10 Essential (primary) hypertension: Secondary | ICD-10-CM

## 2021-08-10 DIAGNOSIS — E1129 Type 2 diabetes mellitus with other diabetic kidney complication: Secondary | ICD-10-CM

## 2021-08-10 DIAGNOSIS — Z7984 Long term (current) use of oral hypoglycemic drugs: Secondary | ICD-10-CM | POA: Diagnosis not present

## 2021-08-10 NOTE — Patient Instructions (Signed)
Thank you, [name] for allowing [place] to provide your care today. Today we discussed your blood pressure and your diabetes.  Blood pressure - Please start taking Amlodipine 10 mg a day - Please continue taking your Losartan-hydrochlorothiazide pill every day - Start taking your blood pressure measurements EVERY DAY at the same time. Write it on a piece paper, and keep track of it. Bring it to your next appointment - If your blood pressure- the top number- is above >150, call us to make an appointment to change your medications - Please continue keeping track of salt and sodium in your diet.  - Please try to  get 150 min of exercise a week - you can divide it in 10-30 min a day   Diabetes - Keep taking this medication - Try to keep track of your blood sugar  Please follow-up in in 8 weeks.  Please make sure to arrive 15 minutes prior to your next appointment. If you arrive late, you may be asked to reschedule.    We look forward to seeing you next time. Please call our clinic at [phone] if you have any questions or concerns. The best time to call is Monday-Friday from 9am-4pm, but there is someone available 24/7. If after hours or the weekend, call the main hospital number and ask for the Internal Medicine Resident On-Call. If you need medication refills, please notify your pharmacy one week in advance and they will send [place] a request.   Thank you for letting us take part in your care. Wishing you the best!  Janice Juniper, MD 08/10/2021, 2:13 PM [facility] Resident, PGY-1

## 2021-08-10 NOTE — Progress Notes (Signed)
Subjective:  CC: medication follow up  HPI:  Janice Massey is a 66 y.o. female with a past medical history stated below and presents today for blood pressure and diabetes medication follow up.  Sign language interpreter present for the duration of this encounter. Please see problem based assessment and plan for additional details.  Past Medical History:  Diagnosis Date   Back pain    Cardiac murmur    small membranous VSD with fairly loud cardiac murmur (asymptomatic)    Chronic PID (chronic pelvic inflammatory disease)    Deaf    Since age 53   Diabetes mellitus 2004   History of Doppler ultrasound 06/20/2009   Normal, no prior studies for comparison.    Hypertension    Mute    Obese    URI (upper respiratory infection)    Ventricular septal defect 2004 per echo   small membranous VSD    Current Outpatient Medications on File Prior to Visit  Medication Sig Dispense Refill   Accu-Chek FastClix Lancets MISC 1 Units by Does not apply route daily. Use once a daily in morning to check blood glucose 102 each 3   amLODipine (NORVASC) 10 MG tablet Take 1 tablet (10 mg total) by mouth daily. 90 tablet 3   aspirin 81 MG chewable tablet Chew 1 tablet (81 mg total) by mouth daily. 30 tablet 3   atorvastatin (LIPITOR) 40 MG tablet Take 1 tablet (40 mg total) by mouth daily. (Patient taking differently: Take 40 mg by mouth at bedtime.) 30 tablet 2   Blood Glucose Monitoring Suppl (ACCU-CHEK GUIDE) w/Device KIT 1 Units by Does not apply route daily. 1 kit 0   cetirizine (ZYRTEC) 10 MG tablet Take 1 tablet (10 mg total) by mouth daily as needed for allergies. 90 tablet 1   Empagliflozin-metFORMIN HCl (SYNJARDY) 05-998 MG TABS Take 1 tablet by mouth 2 (two) times daily. 120 tablet 3   fluticasone (FLONASE) 50 MCG/ACT nasal spray SHAKE LIQUID AND USE 2 SPRAYS IN EACH NOSTRIL DAILY (Patient taking differently: Place 1 spray into both nostrils daily as needed for allergies.) 16 g 2    glucose blood (ACCU-CHEK GUIDE) test strip Use as instructed 100 each 12   latanoprost (XALATAN) 0.005 % ophthalmic solution INSTILL 1 DROP IN BOTH EYES EVERY DAY IN THE EVENING (Patient not taking: Reported on 05/01/2021) 2.5 mL 1   losartan-hydrochlorothiazide (HYZAAR) 100-25 MG tablet Take 1 tablet by mouth daily. 90 tablet 1   omeprazole (PRILOSEC) 40 MG capsule Take 1 capsule (40 mg total) by mouth daily. 90 capsule 0   polyethylene glycol (MIRALAX) 17 g packet Take 17 g by mouth 2 (two) times daily. 14 each 0   senna-docusate (SENOKOT-S) 8.6-50 MG tablet Take 2 tablets by mouth 2 (two) times daily. 30 tablet 0   No current facility-administered medications on file prior to visit.    Family History  Problem Relation Age of Onset   Cancer Mother        liver   Heart attack Mother 75   Hypertension Father    Diabetes Father    Heart attack Father    CAD Father 67   Diabetes Paternal Aunt    Diabetes Maternal Grandfather    Breast cancer Cousin    Colon cancer Neg Hx    Endometrial cancer Neg Hx    Pancreatic cancer Neg Hx    Prostate cancer Neg Hx    Ovarian cancer Neg Hx  Social History   Socioeconomic History   Marital status: Single    Spouse name: Not on file   Number of children: 1   Years of education: Not on file   Highest education level: Not on file  Occupational History    Employer: UNEMPLOYED  Tobacco Use   Smoking status: Never   Smokeless tobacco: Never  Vaping Use   Vaping Use: Never used  Substance and Sexual Activity   Alcohol use: No    Alcohol/week: 0.0 standard drinks of alcohol   Drug use: No   Sexual activity: Not Currently    Birth control/protection: Surgical  Other Topics Concern   Not on file  Social History Narrative   Not on file   Social Determinants of Health   Financial Resource Strain: Not on file  Food Insecurity: Not on file  Transportation Needs: Not on file  Physical Activity: Not on file  Stress: Not on file   Social Connections: Not on file  Intimate Partner Violence: Not on file    Review of Systems: ROS negative except for what is noted on the assessment and plan.  Objective:   Vitals:   08/10/21 1336 08/10/21 1427  BP: (!) 166/86 131/79  Pulse: 88 79  Weight: 176 lb 1.6 oz (79.9 kg)     Physical Exam: Constitutional: well-appearing woman sitting in chair, in no acute distress HENT: normocephalic atraumatic, mucous membranes moist.  Neck: supple Cardiovascular: regular rate and rhythm, no m/r/g, no JVD Pulmonary/Chest: normal work of breathing on room air, lungs clear to auscultation bilaterally Abdominal: soft, non-tender, non-distended MSK: normal bulk and tone Neurological: alert & oriented x 3 Skin: warm and dry Psych: Pleasant mood and affect     Assessment & Plan:   Essential hypertension Initial blood pressure today 166/86. Patient denied palpitations, changes in vision, SOB at rest or DOE, or chest pain. Of note, patient was switched from Diltiazem to Amlodipine on 04/2021. While patient continued taking Losartan-HCTZ, patient did not understand she needed to start Amlodipine and this was never filled. Patient understands now. No acute abnormalities on exam today. Patient was encouraged to continue monitoring sodium and salt intake at home, which she reports doing. Advised patient to do 150 min of physical activity during the week, which she was excited to do. Additionally, patient was given BP log to better assess her home BP at the next visit.  - Continue Losartan-HCTZ - Start Amlodipine 10 mg - BP log - BMP next visit  Diabetes ([place]) Last A1c 9.1 on 7/1. Patient on Synjardy 05-998 mg BID and Ozempic 0.5 mg/weekly since last visit. Patient denies n/v, alterations in bowel frequency or movements, HA, polydipsia, polyuria, weight loss. Patient brought medications with her to clinic, with few doses left. Will continue on current regimen since patient is tolerating well.   - Continue Synjardy - Continue Ozempic - A1c at follow up in ~8 weeks   Patient seen with Dr. Evette Doffing

## 2021-08-10 NOTE — Assessment & Plan Note (Signed)
Initial blood pressure today 166/86. Patient denied palpitations, changes in vision, SOB at rest or DOE, or chest pain. Of note, patient was switched from Diltiazem to Amlodipine on 04/2021. While patient continued taking Losartan-HCTZ, patient did not understand she needed to start Amlodipine and this was never filled. Patient understands now. No acute abnormalities on exam today. Patient was encouraged to continue monitoring sodium and salt intake at home, which she reports doing. Advised patient to do 150 min of physical activity during the week, which she was excited to do. Additionally, patient was given BP log to better assess her home BP at the next visit.  - Continue Losartan-HCTZ - Start Amlodipine 10 mg - BP log - BMP next visit

## 2021-08-10 NOTE — Assessment & Plan Note (Signed)
Last A1c 9.1 on 7/1. Patient on Synjardy 05-998 mg BID and Ozempic 0.5 mg/weekly since last visit. Patient denies n/v, alterations in bowel frequency or movements, HA, polydipsia, polyuria, weight loss. Patient brought medications with her to clinic, with few doses left. Will continue on current regimen since patient is tolerating well.  - Continue Synjardy - Continue Ozempic - A1c at follow up in ~8 weeks

## 2021-08-11 NOTE — Progress Notes (Signed)
Internal Medicine Clinic Attending  I saw and evaluated the patient.  I personally confirmed the key portions of the history and exam documented by Dr. Simeon Craft and I reviewed pertinent patient test results.  The assessment, diagnosis, and plan were formulated together and I agree with the documentation in the resident's note.

## 2021-08-19 ENCOUNTER — Other Ambulatory Visit: Payer: Self-pay | Admitting: Internal Medicine

## 2021-08-19 DIAGNOSIS — I10 Essential (primary) hypertension: Secondary | ICD-10-CM

## 2021-08-30 ENCOUNTER — Other Ambulatory Visit: Payer: Self-pay | Admitting: Internal Medicine

## 2021-08-30 DIAGNOSIS — E785 Hyperlipidemia, unspecified: Secondary | ICD-10-CM

## 2021-08-31 ENCOUNTER — Other Ambulatory Visit: Payer: Self-pay | Admitting: Internal Medicine

## 2021-08-31 DIAGNOSIS — R809 Proteinuria, unspecified: Secondary | ICD-10-CM

## 2021-09-29 ENCOUNTER — Other Ambulatory Visit: Payer: Self-pay | Admitting: Internal Medicine

## 2021-09-29 DIAGNOSIS — Z1231 Encounter for screening mammogram for malignant neoplasm of breast: Secondary | ICD-10-CM

## 2021-10-02 ENCOUNTER — Inpatient Hospital Stay: Admission: RE | Admit: 2021-10-02 | Payer: Medicaid Other | Source: Ambulatory Visit

## 2021-10-07 ENCOUNTER — Ambulatory Visit (HOSPITAL_COMMUNITY)
Admission: EM | Admit: 2021-10-07 | Discharge: 2021-10-07 | Disposition: A | Discharge status: Home / Self Care | Payer: Medicare Other | Attending: Internal Medicine | Admitting: Internal Medicine

## 2021-10-07 ENCOUNTER — Encounter (HOSPITAL_COMMUNITY): Payer: Self-pay

## 2021-10-07 DIAGNOSIS — S99929A Unspecified injury of unspecified foot, initial encounter: Secondary | ICD-10-CM

## 2021-10-07 DIAGNOSIS — R21 Rash and other nonspecific skin eruption: Secondary | ICD-10-CM | POA: Diagnosis present

## 2021-10-07 DIAGNOSIS — N898 Other specified noninflammatory disorders of vagina: Secondary | ICD-10-CM | POA: Diagnosis present

## 2021-10-07 DIAGNOSIS — L2389 Allergic contact dermatitis due to other agents: Secondary | ICD-10-CM | POA: Diagnosis present

## 2021-10-07 MED ORDER — TRIAMCINOLONE ACETONIDE 0.1 % EX CREA: 1.0000 | TOPICAL_CREAM | Freq: Two times a day (BID) | CUTANEOUS | 0 refills | Status: DC

## 2021-10-07 MED ORDER — METRONIDAZOLE 500 MG PO TABS: 500.0000 mg | ORAL_TABLET | Freq: Two times a day (BID) | ORAL | 0 refills | Status: DC

## 2021-10-07 MED ORDER — LIDOCAINE HCL (PF) 1 % IJ SOLN
INTRAMUSCULAR | Status: AC
  Filled 2021-10-07: qty 30

## 2021-10-07 NOTE — ED Triage Notes (Signed)
Pt toenail on the right foot is hanging off since today causing pain and discomfort

## 2021-10-07 NOTE — ED Triage Notes (Signed)
Pt is here for a possible allergic reaction to a a detergent causing a rash , and itching since last night

## 2021-10-07 NOTE — Discharge Instructions (Signed)
Take Flagyl twice daily for the next 7 days to treat suspected bacterial vaginosis infection causing your vaginal discharge and odor. Take this medicine with food to avoid stomach upset and do not drink any alcohol while taking this medication and for 48 hours after finishing her last dose.   Apply triamcinolone cream to your back twice daily for the next 7 days and do not use any more of the laundry detergent causing the allergic reaction.   If you develop any new or worsening symptoms or do not improve in the next 2 to 3 days, please return.  If your symptoms are severe, please go to the emergency room.  Follow-up with your primary care provider for further evaluation and management of your symptoms as well as ongoing wellness visits.  I hope you feel better!

## 2021-10-07 NOTE — ED Provider Notes (Signed)
[place]    CSN: [id number] Arrival date & time: 10/07/21  4332      History   Chief Complaint Chief Complaint  Patient presents with   Allergic Reaction    HPI Brandi Mendez is a 66 y.o. female.   Patient presents to urgent care for evaluation of rash to the lower/mid back that started a couple of days ago after switching from gain laundry detergent to oxi clean laundry detergent. Rash is itchy, raised, splotchy, and red.  Denies recent exposure to new bedding, poisonous plants, different body washes/soaps, and other personal hygiene products.  She has not worn clothes without washing them recently.  Rash is localized to the low back and has not spread to other areas of the body.  Denies pain to the rash.  No one else in the household has similar symptoms.  She would also like to be evaluated for vaginal discharge that started a few days ago.  Vaginal discharge is white and thin.  She is experiencing slight odor as well to the discharge but denies itching and rash to the vaginal area.  No concern for exposure to STI or recent new sexual partners.  No urinary symptoms or nausea, vomiting, diarrhea, constipation, abdominal pain, or flank pain.  Denies fever/chills.  She is postmenopausal and denies vaginal bleeding.  She has not attempted use of any over-the-counter medications prior to arrival urgent care for either her rash or her vaginal discharge.     Past Medical History:  Diagnosis Date   Small bowel obstruction ([place]) 2013, 2015    Patient Active Problem List   Diagnosis Date Noted   Abdominal pain 03/29/2020   History of small bowel obstruction    Diarrhea    Cough 06/10/2019   Left shoulder pain 06/10/2019   Otalgia of right ear 01/31/2019   Vision changes 01/31/2019   Chest pain of uncertain etiology 95/18/8416   Hypotension 10/05/2012   HYPERTRIGLYCERIDEMIA, MILD 12/26/2006   OBESITY, UNSPECIFIED 12/26/2006    Past Surgical History:  Procedure  Laterality Date   ABDOMINAL HERNIA REPAIR     ABDOMINAL HYSTERECTOMY     [place]   CHOLECYSTECTOMY     Left ankle surgery      OB History   No obstetric history on file.      Home Medications    Prior to Admission medications   Medication Sig Start Date End Date Taking? Authorizing Provider  metroNIDAZOLE (FLAGYL) 500 MG tablet Take 1 tablet (500 mg total) by mouth 2 (two) times daily. 10/07/21  Yes Talbot Grumbling, FNP  triamcinolone cream (KENALOG) 0.1 % Apply 1 Application topically 2 (two) times daily. 10/07/21  Yes Talbot Grumbling, FNP  acetaminophen (TYLENOL) 500 MG tablet Take 500 mg by mouth every 6 (six) hours as needed.    [provider]  fluticasone (FLONASE) 50 MCG/ACT nasal spray Place 2 sprays into both nostrils daily. 01/24/19 08/07/19  Danna Hefty, DO    Family History Family History  Problem Relation Age of Onset   Cancer Mother        Breast? but patient not sure   Diabetes Father    Diabetes Sister    Early death Brother 37       aneurysm   Heart disease Maternal Grandmother    Diabetes Paternal Grandfather     Social History Social History   Tobacco Use   Smoking status: Never   Smokeless tobacco: Never  Vaping  Use   Vaping Use: Never used  Substance Use Topics   Alcohol use: Yes    Comment: rarely   Drug use: No     Allergies   Patient has no known allergies.   Review of Systems Review of Systems Per HPI  Physical Exam Triage Vital Signs ED Triage Vitals  Enc Vitals Group     BP 10/07/21 1007 125/75     Pulse Rate 10/07/21 1007 60     Resp 10/07/21 1007 12     Temp 10/07/21 1007 98.3 F (36.8 C)     Temp Source 10/07/21 1007 Oral     SpO2 10/07/21 1007 98 %     Weight --      Height --      Head Circumference --      Peak Flow --      Pain Score 10/07/21 1010 0     Pain Loc --      Pain Edu? --      Excl. in [place]? --    No data found.  Updated Vital Signs BP 125/75 (BP Location: Right  Arm)   Pulse 60   Temp 98.3 F (36.8 C) (Oral)   Resp 12   SpO2 98%   Visual Acuity Right Eye Distance:   Left Eye Distance:   Bilateral Distance:    Right Eye Near:   Left Eye Near:    Bilateral Near:     Physical Exam Vitals and nursing note reviewed.  Constitutional:      Appearance: She is not ill-appearing or toxic-appearing.  HENT:     Head: Normocephalic and atraumatic.     Right Ear: Hearing and external ear normal.     Left Ear: Hearing and external ear normal.     Nose: Nose normal.     Mouth/Throat:     Lips: Pink.  Eyes:     General: Lids are normal. Vision grossly intact. Gaze aligned appropriately.     Extraocular Movements: Extraocular movements intact.     Conjunctiva/sclera: Conjunctivae normal.  Cardiovascular:     Rate and Rhythm: Normal rate and regular rhythm.     Heart sounds: Normal heart sounds, S1 normal and S2 normal.  Pulmonary:     Effort: Pulmonary effort is normal. No respiratory distress.     Breath sounds: Normal breath sounds and air entry.  Genitourinary:    Comments: Deferred. Musculoskeletal:     Cervical back: Neck supple.  Skin:    General: Skin is warm and dry.     Capillary Refill: Capillary refill takes less than 2 seconds.     Findings: Rash present.     Comments: Slightly raised and erythematous diffuse maculopapular rash to the lower back as pictured below in image.  No evidence of intense itch or signs of excoriation.  No warmth or drainage to the rash.  Neurological:     General: No focal deficit present.     Mental Status: She is alert and oriented to person, place, and time. Mental status is at baseline.     Cranial Nerves: No dysarthria or facial asymmetry.  Psychiatric:        Mood and Affect: Mood normal.        Speech: Speech normal.        Behavior: Behavior normal.        Thought Content: Thought content normal.        Judgment: Judgment normal.  UC Treatments / Results  Labs (all labs ordered  are listed, but only abnormal results are displayed) Labs Reviewed  CERVICOVAGINAL ANCILLARY ONLY    EKG   Radiology No results found.  Procedures Procedures (including critical care time)  Medications Ordered in UC Medications - No data to display  Initial Impression / Assessment and Plan / UC Course  I have reviewed the triage vital signs and the nursing notes.  Pertinent labs & imaging results that were available during my care of the patient were reviewed by me and considered in my medical decision making (see chart for details).   1.  Allergic contact dermatitis due to other agents Rashes consistent with contact dermatitis due to allergen and recent exposure to new laundry detergent.  Patient is to apply triamcinolone cream to the rash twice daily for the next 5 to 7 days to reduce irritation and inflammation.  Advised patient to avoid use of this medication for longer than 5 to 7 days as this is a steroid and can cause skin thinning.  No indication for systemic steroid use as rash is localized to the lower back.  2. Vaginal discharge Vaginal discharge is likely related to a bacterial vaginitis infection and therefore we will begin treatment with Flagyl twice daily for the next 7 days.  Advised patient to avoid alcohol use during and for 48 hours after treatment due to significant side effects.  STI testing is pending and will come back in the next 2 to 3 days.  We will provide treatment for all other STIs detected on STI screening at time of result.  Patient is agreeable with this plan.  Discussed physical exam and available lab work findings in clinic with patient.  Counseled patient regarding appropriate use of medications and potential side effects for all medications recommended or prescribed today. Discussed red flag signs and symptoms of worsening condition,when to call the PCP office, return to urgent care, and when to seek higher level of care in the emergency department.  Patient verbalizes understanding and agreement with plan. All questions answered. Patient discharged in stable condition.    Final Clinical Impressions(s) / UC Diagnoses   Final diagnoses:  Allergic contact dermatitis due to other agents  Rash and nonspecific skin eruption  Vaginal discharge     Discharge Instructions      Take Flagyl twice daily for the next 7 days to treat suspected bacterial vaginosis infection causing your vaginal discharge and odor. Take this medicine with food to avoid stomach upset and do not drink any alcohol while taking this medication and for 48 hours after finishing her last dose.   Apply triamcinolone cream to your back twice daily for the next 7 days and do not use any more of the laundry detergent causing the allergic reaction.   If you develop any new or worsening symptoms or do not improve in the next 2 to 3 days, please return.  If your symptoms are severe, please go to the emergency room.  Follow-up with your primary care provider for further evaluation and management of your symptoms as well as ongoing wellness visits.  I hope you feel better!   ED Prescriptions     Medication Sig Dispense Auth. Provider   metroNIDAZOLE (FLAGYL) 500 MG tablet Take 1 tablet (500 mg total) by mouth 2 (two) times daily. 14 tablet Joella Prince M, FNP   triamcinolone cream (KENALOG) 0.1 % Apply 1 Application topically 2 (two) times daily. 30 g Joella Prince M,  FNP      PDMP not reviewed this encounter.   Talbot Grumbling, [place] 10/07/21 1728

## 2021-10-07 NOTE — Discharge Instructions (Addendum)
Keep your great toenail bed covered with Vaseline gauze for the next few days.   Follow-up with your podiatrist as scheduled on Friday.   If you develop any new or worsening symptoms or do not improve in the next 2 to 3 days, please return.  If your symptoms are severe, please go to the emergency room.  Follow-up with your primary care provider for further evaluation and management of your symptoms as well as ongoing wellness visits.  I hope you feel better!

## 2021-10-07 NOTE — ED Provider Notes (Signed)
[place]    CSN: [id number] Arrival date & time: 10/07/21  [place]      History   Chief Complaint Chief Complaint  Patient presents with   Nail Problem    HPI Brandi Mendez is a 66 y.o. female.   Patient presents to urgent care for evaluation of her right great toenail.  She states that the right great toenail had been very loose and about to come off for the last few weeks and she had an appointment scheduled to have the toenail fully removed by podiatry.  She struck her toenail on an unknown object this afternoon causing the toenail to detach from the right great toe on the sides.  The toenail remains intact to the proximal nail fold and the eponychium.  No numbness or tingling to the right great toe.  She is not diabetic and the nailbed is not bleeding.      Past Medical History:  Diagnosis Date   Small bowel obstruction ([place]) 2013, 2015    Patient Active Problem List   Diagnosis Date Noted   Abdominal pain 03/29/2020   History of small bowel obstruction    Diarrhea    Cough 06/10/2019   Left shoulder pain 06/10/2019   Otalgia of right ear 01/31/2019   Vision changes 01/31/2019   Chest pain of uncertain etiology 06/30/9483   Hypotension 10/05/2012   HYPERTRIGLYCERIDEMIA, MILD 12/26/2006   OBESITY, UNSPECIFIED 12/26/2006    Past Surgical History:  Procedure Laterality Date   ABDOMINAL HERNIA REPAIR     ABDOMINAL HYSTERECTOMY     [place]   CHOLECYSTECTOMY     Left ankle surgery      OB History   No obstetric history on file.      Home Medications    Prior to Admission medications   Medication Sig Start Date End Date Taking? Authorizing Provider  acetaminophen (TYLENOL) 500 MG tablet Take 500 mg by mouth every 6 (six) hours as needed.    [provider]  metroNIDAZOLE (FLAGYL) 500 MG tablet Take 1 tablet (500 mg total) by mouth 2 (two) times daily. 10/07/21   Talbot Grumbling, FNP  triamcinolone cream (KENALOG) 0.1 %  Apply 1 Application topically 2 (two) times daily. 10/07/21   Talbot Grumbling, FNP  fluticasone (FLONASE) 50 MCG/ACT nasal spray Place 2 sprays into both nostrils daily. 01/24/19 08/07/19  Danna Hefty, DO    Family History Family History  Problem Relation Age of Onset   Cancer Mother        Breast? but patient not sure   Diabetes Father    Diabetes Sister    Early death Brother 55       aneurysm   Heart disease Maternal Grandmother    Diabetes Paternal Grandfather     Social History Social History   Tobacco Use   Smoking status: Never   Smokeless tobacco: Never  Vaping Use   Vaping Use: Never used  Substance Use Topics   Alcohol use: Yes    Comment: rarely   Drug use: No     Allergies   Patient has no known allergies.   Review of Systems Review of Systems Per HPI  Physical Exam Triage Vital Signs ED Triage Vitals  Enc Vitals Group     BP 10/07/21 1919 135/76     Pulse Rate 10/07/21 1919 81     Resp 10/07/21 1919 12     Temp 10/07/21 1919 97.8 F (36.6  C)     Temp Source 10/07/21 1919 Oral     SpO2 10/07/21 1919 98 %     Weight --      Height --      Head Circumference --      Peak Flow --      Pain Score 10/07/21 1917 10     Pain Loc --      Pain Edu? --      Excl. in [place]? --    No data found.  Updated Vital Signs BP 135/76 (BP Location: Right Arm)   Pulse 81   Temp 97.8 F (36.6 C) (Oral)   Resp 12   SpO2 98%   Visual Acuity Right Eye Distance:   Left Eye Distance:   Bilateral Distance:    Right Eye Near:   Left Eye Near:    Bilateral Near:     Physical Exam Vitals and nursing note reviewed.  Constitutional:      Appearance: She is not ill-appearing or toxic-appearing.  HENT:     Head: Normocephalic and atraumatic.     Right Ear: Hearing and external ear normal.     Left Ear: Hearing and external ear normal.     Nose: Nose normal.     Mouth/Throat:     Lips: Pink.  Eyes:     General: Lids are normal. Vision grossly  intact. Gaze aligned appropriately.     Extraocular Movements: Extraocular movements intact.     Conjunctiva/sclera: Conjunctivae normal.  Cardiovascular:     Pulses:          Dorsalis pedis pulses are 2+ on the right side.  Pulmonary:     Effort: Pulmonary effort is normal.  Musculoskeletal:     Cervical back: Neck supple.  Feet:     Comments: Right great toe nail partially detached.  Attached at the eponychium but detached on the bilateral sides.  Nailbed is nonbleeding and does not appear infected/draining.  Capillary refill is less than 3 to the affected toe.  See images below for details. Skin:    General: Skin is warm and dry.     Capillary Refill: Capillary refill takes less than 2 seconds.     Findings: No rash.  Neurological:     General: No focal deficit present.     Mental Status: She is alert and oriented to person, place, and time. Mental status is at baseline.     Cranial Nerves: No dysarthria or facial asymmetry.  Psychiatric:        Mood and Affect: Mood normal.        Speech: Speech normal.        Behavior: Behavior normal.        Thought Content: Thought content normal.        Judgment: Judgment normal.         UC Treatments / Results  Labs (all labs ordered are listed, but only abnormal results are displayed) Labs Reviewed - No data to display  EKG   Radiology No results found.  Procedures Procedures (including critical care time)  Medications Ordered in UC Medications - No data to display  Initial Impression / Assessment and Plan / UC Course  I have reviewed the triage vital signs and the nursing notes.  Pertinent labs & imaging results that were available during my care of the patient were reviewed by me and considered in my medical decision making (see chart for details).     *** Final Clinical  Impressions(s) / UC Diagnoses   Final diagnoses:  None   Discharge Instructions   None    ED Prescriptions   None    PDMP not  reviewed this encounter.

## 2021-10-08 LAB — CERVICOVAGINAL ANCILLARY ONLY
Bacterial Vaginitis (gardnerella): NEGATIVE
Candida Glabrata: NEGATIVE
Candida Vaginitis: NEGATIVE
Chlamydia: NEGATIVE
Comment: NEGATIVE
Comment: NEGATIVE
Comment: NEGATIVE
Comment: NEGATIVE
Comment: NEGATIVE
Comment: NORMAL
Neisseria Gonorrhea: NEGATIVE
Trichomonas: NEGATIVE

## 2021-10-12 ENCOUNTER — Ambulatory Visit (INDEPENDENT_AMBULATORY_CARE_PROVIDER_SITE_OTHER): Payer: Medicare Other | Admitting: Internal Medicine

## 2021-10-12 ENCOUNTER — Encounter: Payer: Self-pay | Admitting: Internal Medicine

## 2021-10-12 ENCOUNTER — Other Ambulatory Visit: Payer: Self-pay

## 2021-10-12 VITALS — BP 137/88 | HR 92 | Temp 97.8°F | Ht 67.0 in | Wt 173.0 lb

## 2021-10-12 DIAGNOSIS — K219 Gastro-esophageal reflux disease without esophagitis: Secondary | ICD-10-CM

## 2021-10-12 DIAGNOSIS — Z23 Encounter for immunization: Secondary | ICD-10-CM

## 2021-10-12 DIAGNOSIS — Z1331 Encounter for screening for depression: Secondary | ICD-10-CM | POA: Diagnosis not present

## 2021-10-12 DIAGNOSIS — Z0000 Encounter for general adult medical examination without abnormal findings: Secondary | ICD-10-CM

## 2021-10-12 DIAGNOSIS — E1129 Type 2 diabetes mellitus with other diabetic kidney complication: Secondary | ICD-10-CM

## 2021-10-12 DIAGNOSIS — M810 Age-related osteoporosis without current pathological fracture: Secondary | ICD-10-CM | POA: Diagnosis not present

## 2021-10-12 DIAGNOSIS — I10 Essential (primary) hypertension: Secondary | ICD-10-CM | POA: Diagnosis not present

## 2021-10-12 DIAGNOSIS — R809 Proteinuria, unspecified: Secondary | ICD-10-CM

## 2021-10-12 DIAGNOSIS — E785 Hyperlipidemia, unspecified: Secondary | ICD-10-CM | POA: Diagnosis not present

## 2021-10-12 LAB — POCT GLYCOSYLATED HEMOGLOBIN (HGB A1C): Hemoglobin A1C: 8.2 % — AB (ref 4.0–5.6)

## 2021-10-12 LAB — GLUCOSE, CAPILLARY: Glucose-Capillary: 157 mg/dL — ABNORMAL HIGH (ref 70–99)

## 2021-10-12 MED ORDER — OMEPRAZOLE 40 MG PO CPDR: 40.0000 mg | DELAYED_RELEASE_CAPSULE | Freq: Every day | ORAL | 0 refills | Status: DC

## 2021-10-12 MED ORDER — SYNJARDY 5-1000 MG PO TABS: 1.0000 | ORAL_TABLET | Freq: Two times a day (BID) | ORAL | 3 refills | Status: DC

## 2021-10-12 MED ORDER — SEMAGLUTIDE 3 MG PO TABS: 3.0000 mg | ORAL_TABLET | Freq: Every day | ORAL | 2 refills | Status: DC

## 2021-10-12 MED ORDER — ATORVASTATIN CALCIUM 40 MG PO TABS: 40.0000 mg | ORAL_TABLET | Freq: Every day | ORAL | 0 refills | Status: DC

## 2021-10-12 NOTE — Assessment & Plan Note (Signed)
Refill sent for atorvastatin 40 mg daily.

## 2021-10-12 NOTE — Progress Notes (Signed)
Internal Medicine Clinic Attending ? ?Case discussed with Dr. Dean  At the time of the visit.  We reviewed the resident?s history and exam and pertinent patient test results.  I agree with the assessment, diagnosis, and plan of care documented in the resident?s note.  ?

## 2021-10-12 NOTE — Patient Instructions (Addendum)
Ms. Sligar,  It was a pleasure to meet and care for you today!  I will let you know what the results of your lab work show.   I am not making changes to your medicines at this time.  Please follow up in about 3 months.  My best, Dr. Marlou Sa

## 2021-10-12 NOTE — Assessment & Plan Note (Signed)
HbA1c of 8.2% today, improved from 9.1% 06/2021. Current regimen is empagliflozin-metformin 05-998 mg BID. She is not taking ozempic and has not been for about 3 months because she has not had access to this medication due to it being located at her daughter's home and she no longer lives with her daughter. She is not interested in restarting this medication as she does not want to inject herself. She has been making an effort to eat smaller, healthier meals and to avoid juices and sodas. She had her annual eye exam 10/04 and was given eye drops at that visit. Assessment:She is very excited about the improvement of her HbA1c and is motivated to continue working towards better glycemic control. She is open to adding an additional oral agent to her current regimen to help with this. Plan:Continue empagliflozin-metformin 05-998 mg BID. Start semaglutide 3 mg daily PO; can increase this dose at next OV if she is tolerating it well. Will also check urine protein studies today.

## 2021-10-12 NOTE — Assessment & Plan Note (Signed)
Flu vaccine given today. 

## 2021-10-12 NOTE — Assessment & Plan Note (Signed)
Refill sent for omeprazole, which patient takes each morning.

## 2021-10-12 NOTE — Assessment & Plan Note (Signed)
PHQ-9 is 4 at this time.

## 2021-10-12 NOTE — Progress Notes (Signed)
   CC: 2 month BP check up  HPI:  [name] is a 66 y.o. person with past medical history as detailed below who presents today for 2 month BP check up. Please see problem based charting for detailed assessment and plan.  Past Medical History:  Diagnosis Date   Back pain    Cardiac murmur    small membranous VSD with fairly loud cardiac murmur (asymptomatic)    Chronic PID (chronic pelvic inflammatory disease)    Deaf    Since age 30   Diabetes mellitus 2004   History of Doppler ultrasound 06/20/2009   Normal, no prior studies for comparison.    Hypertension    Mute    Obese    URI (upper respiratory infection)    Ventricular septal defect 2004 per echo   small membranous VSD   Review of Systems:  Negative unless otherwise stated.  Physical Exam:  Vitals:   10/12/21 1115  BP: 137/88  Pulse: 92  Temp: 97.8 F (36.6 C)  TempSrc: Oral  SpO2: 98%  Weight: 173 lb (78.5 kg)  Height: '5\' 7"'$  (1.702 m)   Constitutional:Appears well today. In no acute distress. Cardio:Regular rate and rhythm. No murmurs, rubs, or gallops. Pulm:Clear to auscultation bilaterally. Normal work of breathing on room air. Abdomen:Soft, nontender, nondistended. OXB:DZHGDJME for extremity edema. Skin:Warm and dry. Neuro:Alert and oriented x3. No focal deficit noted. Psych:Pleasant mood and affect.   Assessment & Plan:   See Encounters Tab for problem based charting.  Essential hypertension BP at goal today, 137/88. Current regimen is amlodipine 10 mg daily, losartan-HCTZ 100-25 mg daily.  Plan: Continue amlodipine 10 mg daily, losartan-HCTZ 100-25 mg daily. Will check BMP today.  GERD Refill sent for omeprazole, which patient takes each morning.  Diabetes ([place]) HbA1c of 8.2% today, improved from 9.1% 06/2021. Current regimen is empagliflozin-metformin 05-998 mg BID. She is not taking ozempic and has not been for about 3 months because she has not had access to this medication due to it  being located at her daughter's home and she no longer lives with her daughter. She is not interested in restarting this medication as she does not want to inject herself. She has been making an effort to eat smaller, healthier meals and to avoid juices and sodas. She had her annual eye exam 10/04 and was given eye drops at that visit. Assessment:She is very excited about the improvement of her HbA1c and is motivated to continue working towards better glycemic control. She is open to adding an additional oral agent to her current regimen to help with this. Plan:Continue empagliflozin-metformin 05-998 mg BID. Start semaglutide 3 mg daily PO; can increase this dose at next OV if she is tolerating it well. Will also check urine protein studies today.  Depression screening PHQ-9 is 4 at this time.   Healthcare maintenance Patient states that she never received a call to get DEXA scan. New order placed at this time.  Hyperlipidemia Refill sent for atorvastatin 40 mg daily.  Need for immunization against influenza Flu vaccine given today.  Patient discussed with Dr. Philipp Ovens

## 2021-10-12 NOTE — Assessment & Plan Note (Signed)
BP at goal today, 137/88. Current regimen is amlodipine 10 mg daily, losartan-HCTZ 100-25 mg daily.  Plan: Continue amlodipine 10 mg daily, losartan-HCTZ 100-25 mg daily. Will check BMP today.

## 2021-10-12 NOTE — Assessment & Plan Note (Signed)
Patient states that she never received a call to get DEXA scan. New order placed at this time.

## 2021-10-13 LAB — MICROALBUMIN / CREATININE URINE RATIO
Creatinine, Urine: 55.2 mg/dL
Microalb/Creat Ratio: 159 mg/g creat — ABNORMAL HIGH (ref 0–29)
Microalbumin, Urine: 88 ug/mL

## 2021-10-13 LAB — BMP8+ANION GAP
Anion Gap: 18 mmol/L (ref 10.0–18.0)
BUN/Creatinine Ratio: 16 (ref 12–28)
BUN: 13 mg/dL (ref 8–27)
CO2: 24 mmol/L (ref 20–29)
Calcium: 10 mg/dL (ref 8.7–10.3)
Chloride: 98 mmol/L (ref 96–106)
Creatinine, Ser: 0.8 mg/dL (ref 0.57–1.00)
Glucose: 145 mg/dL — ABNORMAL HIGH (ref 70–99)
Potassium: 3.7 mmol/L (ref 3.5–5.2)
Sodium: 140 mmol/L (ref 134–144)
eGFR: 82 mL/min/{1.73_m2} (ref >59)

## 2021-10-14 ENCOUNTER — Telehealth: Payer: Self-pay | Admitting: Internal Medicine

## 2021-10-14 NOTE — Telephone Encounter (Signed)
With the help of a video interpreter for ASL, I communicated with the patient to deliver her lab results from her recent [facility]. I also clarified the dosing of her new medication, semaglutide in tablet form, for her. All questions were answered prior to ending this call.  Farrel Gordon, DO

## 2021-10-16 ENCOUNTER — Ambulatory Visit: Payer: Medicare Other | Admitting: Podiatry

## 2021-11-02 ENCOUNTER — Other Ambulatory Visit: Payer: Self-pay | Admitting: Internal Medicine

## 2021-12-17 ENCOUNTER — Ambulatory Visit: Payer: Medicare Other | Admitting: Obstetrics and Gynecology

## 2022-01-06 ENCOUNTER — Emergency Department (HOSPITAL_COMMUNITY): Payer: Medicare Other

## 2022-01-06 ENCOUNTER — Emergency Department (HOSPITAL_COMMUNITY)
Admission: EM | Admit: 2022-01-06 | Discharge: 2022-01-06 | Discharge status: Against Medical Advice | Payer: Medicare Other | Attending: Emergency Medicine | Admitting: Emergency Medicine

## 2022-01-06 DIAGNOSIS — Z5321 Procedure and treatment not carried out due to patient leaving prior to being seen by health care provider: Secondary | ICD-10-CM | POA: Diagnosis not present

## 2022-01-06 DIAGNOSIS — R1031 Right lower quadrant pain: Secondary | ICD-10-CM | POA: Diagnosis not present

## 2022-01-06 DIAGNOSIS — R509 Fever, unspecified: Secondary | ICD-10-CM | POA: Diagnosis not present

## 2022-01-06 LAB — CBC
HCT: 41.6 % (ref 36.0–46.0)
Hemoglobin: 13.9 g/dL (ref 12.0–15.0)
MCH: 29.3 pg (ref 26.0–34.0)
MCHC: 33.4 g/dL (ref 30.0–36.0)
MCV: 87.8 fL (ref 80.0–100.0)
Platelets: 288 10*3/uL (ref 150–400)
RBC: 4.74 MIL/uL (ref 3.87–5.11)
RDW: 13.2 % (ref 11.5–15.5)
WBC: 9.7 10*3/uL (ref 4.0–10.5)
nRBC: 0 % (ref 0.0–0.2)

## 2022-01-06 LAB — I-STAT CHEM 8, ED
BUN: 12 mg/dL (ref 8–23)
Calcium, Ion: 1.17 mmol/L (ref 1.15–1.40)
Chloride: 100 mmol/L (ref 98–111)
Creatinine, Ser: 0.8 mg/dL (ref 0.44–1.00)
Glucose, Bld: 126 mg/dL — ABNORMAL HIGH (ref 70–99)
HCT: 43 % (ref 36.0–46.0)
Hemoglobin: 14.6 g/dL (ref 12.0–15.0)
Potassium: 3.9 mmol/L (ref 3.5–5.1)
Sodium: 138 mmol/L (ref 135–145)
TCO2: 27 mmol/L (ref 22–32)

## 2022-01-06 LAB — COMPREHENSIVE METABOLIC PANEL
ALT: 28 U/L (ref 0–44)
AST: 29 U/L (ref 15–41)
Albumin: 4.2 g/dL (ref 3.5–5.0)
Alkaline Phosphatase: 69 U/L (ref 38–126)
Anion gap: 13 (ref 5–15)
BUN: 12 mg/dL (ref 8–23)
CO2: 24 mmol/L (ref 22–32)
Calcium: 9.8 mg/dL (ref 8.9–10.3)
Chloride: 98 mmol/L (ref 98–111)
Creatinine, Ser: 0.86 mg/dL (ref 0.44–1.00)
GFR, Estimated: >60 mL/min (ref >60)
Glucose, Bld: 127 mg/dL — ABNORMAL HIGH (ref 70–99)
Potassium: 3.8 mmol/L (ref 3.5–5.1)
Sodium: 135 mmol/L (ref 135–145)
Total Bilirubin: 1.2 mg/dL (ref 0.3–1.2)
Total Protein: 7.2 g/dL (ref 6.5–8.1)

## 2022-01-06 LAB — LIPASE, BLOOD: Lipase: 33 U/L (ref 11–51)

## 2022-01-06 MED ORDER — IOHEXOL 350 MG/ML SOLN
75.0000 mL | Freq: Once | INTRAVENOUS | Status: AC | PRN
  Administered 2022-01-06: 75 mL via INTRAVENOUS

## 2022-01-06 NOTE — ED Provider Triage Note (Signed)
Emergency Medicine Provider Triage Evaluation Note  Hydee Fleece , a 67 y.o. female  was evaluated in triage.  Pt complains of right lower quadrant abdominal pain.  Has been present for the past 2 weeks.  Get significantly worse 2 days ago.  Has associated subjective fevers and chills.  Denies nausea or vomiting.  Review of Systems  Positive: As above Negative: As above  Physical Exam  BP 129/85 (BP Location: Right Arm)   Pulse 98   Temp 97.8 F (36.6 C) (Oral)   Resp 20   SpO2 96%  Gen:   Awake, no distress   Resp:  Normal effort  MSK:   Moves extremities without difficulty  Other:  Exquisite right lower quadrant tenderness to palpation  Medical Decision Making  Medically screening exam initiated at 11:31 AM.  Appropriate orders placed.  Modena Slater was informed that the remainder of the evaluation will be completed by another provider, this initial triage assessment does not replace that evaluation, and the importance of remaining in the ED until their evaluation is complete.  Abdominal pain labs and CT scan ordered   Roylene Reason, Hershal Coria 01/06/22 1132

## 2022-01-06 NOTE — ED Triage Notes (Addendum)
Patient here with complaint of RLQ sharp abdominal pain that started two weeks ago. Patient also reports some dizziness yesterday. Patient denies diarrhea, denies cough, denies emesis. Denies history of abdominal surgeries. Patient is alert, oriented, ambulating independently with steady gait.

## 2022-01-06 NOTE — ED Notes (Signed)
Pt no longer wants to wait

## 2022-01-14 ENCOUNTER — Ambulatory Visit
Admission: RE | Admit: 2022-01-14 | Discharge: 2022-01-14 | Disposition: A | Discharge status: Home / Self Care | Payer: 59 | Source: Ambulatory Visit | Attending: Family Medicine | Admitting: Family Medicine

## 2022-01-14 DIAGNOSIS — Z1231 Encounter for screening mammogram for malignant neoplasm of breast: Secondary | ICD-10-CM

## 2022-01-19 NOTE — Progress Notes (Signed)
[facility]: Clinic Note  Subjective:  History of Present Illness: Janice Massey is a 67 y.o. year old female who presents for routine follow-up. She is here with an ASL interpreter.   She has no new concerns or questions today, just needed some refills.   For her T2DM, A1C was 8.2 in 10/2021, and is 8.2 today. She takes Empaglaflozin 5-metformin '1000mg'$  twice daily, and was started on semaglutide '3mg'$  once daily at last visit.   For her HTN, she takes amlodipine '10mg'$  daily & losartan 100/hctz '25mg'$  daily. BP is 121/79 today.    She was recently in the ER for RUQ/RLQ pain. Labs and CT A/P were unremarkable. She thinks pain is worse when she gets constipated, and resolves when she has softer stools. She has no pain today, this has fully resolved.     Please refer to Assessment and Plan below for full details in Problem-Based Charting.   Past Medical History:  Patient Active Problem List   Diagnosis Date Noted   Depression screening 01/19/2021   Cervical stenosis (uterine cervix) 02/29/2020   Healthcare maintenance 01/30/2020   Breast lump on left side at 5 o'clock position 07/31/2019   Need for immunization against influenza 10/30/2017   Greater trochanteric pain syndrome 04/02/2015   VSD (ventricular septal defect), perimembranous 05/29/2013   Encounter for screening colonoscopy 11/09/2011   Essential hypertension 08/10/2011   Hyperlipidemia 10/11/2007   Hearing loss 11/10/2005   Asthma 11/10/2005   GERD 11/10/2005   Diabetes ([place]) 04/05/2002      Medications:  Current Outpatient Medications:    Semaglutide 7 MG TABS, Take 7 mg by mouth daily., Disp: 90 tablet, Rfl: 3   Accu-Chek FastClix Lancets MISC, 1 Units by Does not apply route daily. Use once a daily in morning to check blood glucose, Disp: 102 each, Rfl: 3   amLODipine (NORVASC) 10 MG tablet, Take 1 tablet (10 mg total) by mouth daily., Disp: 90 tablet, Rfl: 3   aspirin 81 MG chewable tablet,  Chew 1 tablet (81 mg total) by mouth daily., Disp: 30 tablet, Rfl: 3   atorvastatin (LIPITOR) 40 MG tablet, Take 1 tablet (40 mg total) by mouth at bedtime., Disp: 90 tablet, Rfl: 3   Blood Glucose Monitoring Suppl (ACCU-CHEK GUIDE) w/Device KIT, 1 Units by Does not apply route daily., Disp: 1 kit, Rfl: 0   Empagliflozin-metFORMIN HCl (SYNJARDY) 05-998 MG TABS, Take 1 tablet by mouth 2 (two) times daily., Disp: 120 tablet, Rfl: 3   fluticasone (FLONASE) 50 MCG/ACT nasal spray, SHAKE LIQUID AND USE 2 SPRAYS IN EACH NOSTRIL DAILY (Patient taking differently: Place 1 spray into both nostrils daily as needed for allergies.), Disp: 16 g, Rfl: 2   glucose blood (ACCU-CHEK GUIDE) test strip, Use as instructed, Disp: 100 each, Rfl: 12   latanoprost (XALATAN) 0.005 % ophthalmic solution, INSTILL 1 DROP IN BOTH EYES EVERY DAY IN THE EVENING (Patient not taking: Reported on 05/01/2021), Disp: 2.5 mL, Rfl: 1   losartan-hydrochlorothiazide (HYZAAR) 100-25 MG tablet, TAKE 1 TABLET BY MOUTH DAILY, Disp: 90 tablet, Rfl: 1   omeprazole (PRILOSEC) 40 MG capsule, Take 1 capsule (40 mg total) by mouth daily., Disp: 90 capsule, Rfl: 0   polyethylene glycol (MIRALAX) 17 g packet, Take 17 g by mouth 2 (two) times daily., Disp: 14 each, Rfl: 0   senna-docusate (SENOKOT-S) 8.6-50 MG tablet, Take 2 tablets by mouth 2 (two) times daily., Disp: 30 tablet, Rfl: 0   Allergies: Allergies  Allergen  Reactions   Codeine Itching   Lisinopril Cough   Penicillins Itching    Has patient had a PCN reaction causing immediate rash, facial/tongue/throat swelling, SOB or lightheadedness with hypotension: YES Has patient had a PCN reaction causing severe rash involving mucus membranes or skin necrosis: NO Has patient had a PCN reaction that required hospitalization NO Has patient had a PCN reaction occurring within the last 10 years: NO If all of the above answers are "NO", then may proceed with Cephalosporin use.      Objective:    Vitals: Vitals:   01/20/22 1046 01/20/22 1122  BP: (!) 146/82 121/79  Pulse: 79 79  Temp: 97.8 F (36.6 C)   SpO2: 100%      Physical Exam: Physical Exam Constitutional:      Appearance: Normal appearance.  HENT:     Head: Normocephalic and atraumatic.  Cardiovascular:     Rate and Rhythm: Normal rate and regular rhythm.     Heart sounds: Normal heart sounds. No murmur heard. Pulmonary:     Effort: No respiratory distress.     Breath sounds: Normal breath sounds. No wheezing.  Abdominal:     General: Abdomen is flat. Bowel sounds are normal. There is no distension.     Palpations: Abdomen is soft. There is no mass.     Tenderness: There is no abdominal tenderness.  Neurological:     Mental Status: She is alert.      Data: Labs, imaging, and micro were reviewed in Epic. Refer to Assessment and Plan below for full details in Problem-Based Charting.  Assessment & Plan:  Diabetes ([place]) - Continue Empagliflozin-Metformin 5-'1000mg'$  BID - Increase Semaglutide from '3mg'$  to '7mg'$  daily - Repeat A1C in 3 months at next visit  Essential hypertension - Blood pressure looks good on recheck today - Continue Amlodipine '10mg'$  daily and Losartan 100/HCTZ '25mg'$  daily combo pill. I think Carvedilol would be next addition if needed, but not needed today   Healthcare maintenance - DEXA scheduled  - Mammogram scheduled      Patient will follow up in 3 months. Repeat A1C then. She needs an in person ASL interpreter at her visits.   Lottie Mussel, MD

## 2022-01-20 ENCOUNTER — Ambulatory Visit: Payer: Medicare Other

## 2022-01-20 ENCOUNTER — Encounter: Payer: Self-pay | Admitting: Internal Medicine

## 2022-01-20 ENCOUNTER — Other Ambulatory Visit: Payer: Self-pay | Admitting: Internal Medicine

## 2022-01-20 ENCOUNTER — Ambulatory Visit (INDEPENDENT_AMBULATORY_CARE_PROVIDER_SITE_OTHER): Payer: Medicare Other | Admitting: Internal Medicine

## 2022-01-20 VITALS — BP 121/79 | HR 79 | Temp 97.8°F | Wt 167.6 lb

## 2022-01-20 DIAGNOSIS — E785 Hyperlipidemia, unspecified: Secondary | ICD-10-CM

## 2022-01-20 DIAGNOSIS — I10 Essential (primary) hypertension: Secondary | ICD-10-CM

## 2022-01-20 DIAGNOSIS — Z7984 Long term (current) use of oral hypoglycemic drugs: Secondary | ICD-10-CM

## 2022-01-20 DIAGNOSIS — E1129 Type 2 diabetes mellitus with other diabetic kidney complication: Secondary | ICD-10-CM

## 2022-01-20 DIAGNOSIS — Z0000 Encounter for general adult medical examination without abnormal findings: Secondary | ICD-10-CM

## 2022-01-20 DIAGNOSIS — R809 Proteinuria, unspecified: Secondary | ICD-10-CM

## 2022-01-20 LAB — POCT GLYCOSYLATED HEMOGLOBIN (HGB A1C): Hemoglobin A1C: 8.2 % — AB (ref 4.0–5.6)

## 2022-01-20 LAB — GLUCOSE, CAPILLARY: Glucose-Capillary: 121 mg/dL — ABNORMAL HIGH (ref 70–99)

## 2022-01-20 MED ORDER — SEMAGLUTIDE 7 MG PO TABS: 7.0000 mg | ORAL_TABLET | Freq: Every day | ORAL | 3 refills | Status: DC

## 2022-01-20 MED ORDER — ATORVASTATIN CALCIUM 40 MG PO TABS: 40.0000 mg | ORAL_TABLET | Freq: Every day | ORAL | 3 refills | Status: DC

## 2022-01-20 NOTE — Assessment & Plan Note (Signed)
-  Blood pressure looks good on recheck today - Continue Amlodipine '10mg'$  daily and Losartan 100/HCTZ '25mg'$  daily combo pill. I think Carvedilol would be next addition if needed, but not needed today

## 2022-01-20 NOTE — Assessment & Plan Note (Signed)
-  DEXA scheduled  - Mammogram scheduled

## 2022-01-20 NOTE — Assessment & Plan Note (Signed)
-  Continue Empagliflozin-Metformin 5-'1000mg'$  BID - Increase Semaglutide from '3mg'$  to '7mg'$  daily - Repeat A1C in 3 months at next visit

## 2022-01-20 NOTE — Patient Instructions (Signed)
Your A1C (a measure of your diabetes) was high today at 8.2%. Our goal is 7 or less. I am going to increase your semaglutide from '3mg'$  daily to '7mg'$  daily. Stop taking the '3mg'$  tablets. I have sent a new prescription for '7mg'$  tablets to your pharmacy, and you can start the new dose tomorrow.  For your blood pressure, it looks great! Keep taking your current medicines.  We'll see you back in 3 months to repeat your A1C and check on your diabetes.

## 2022-01-22 ENCOUNTER — Encounter (HOSPITAL_COMMUNITY): Payer: Self-pay | Admitting: *Deleted

## 2022-01-22 ENCOUNTER — Ambulatory Visit (HOSPITAL_COMMUNITY)
Admission: EM | Admit: 2022-01-22 | Discharge: 2022-01-22 | Disposition: A | Discharge status: Home / Self Care | Payer: 59 | Attending: Family Medicine | Admitting: Family Medicine

## 2022-01-22 DIAGNOSIS — L239 Allergic contact dermatitis, unspecified cause: Secondary | ICD-10-CM

## 2022-01-22 MED ORDER — PREDNISONE 20 MG PO TABS: 40.0000 mg | ORAL_TABLET | Freq: Every day | ORAL | 0 refills | Status: AC

## 2022-01-22 NOTE — Discharge Instructions (Signed)
Take prednisone 20 mg--2 daily for 5 days  Claritin or Allegra may be a better choice for an antihistamine as they do not make you so sleepy (as opposed to Benadryl)  Follow-up with your primary care about this issue, especially if not completely resolving

## 2022-01-22 NOTE — ED Triage Notes (Signed)
Pt states she has a rash on her whole body that she has had x 1 week. No changes in foods or detergents. She states that the rash itches and she can't sleep. She is using TAC cream without relief.

## 2022-01-22 NOTE — ED Provider Notes (Signed)
[facility]    CSN: [id number] Arrival date & time: 01/22/22  1020      History   Chief Complaint Chief Complaint  Patient presents with   Rash    HPI Brandi Mendez is a 67 y.o. female.    Rash  For rash and itching.  She has been breaking out for about 7 days.  It is on her chest and back and abdomen and extremities.  No trouble with any facial or tongue swelling.  No trouble breathing.  No URI symptoms or fever.  No vomiting or diarrhea.  She has been taking Benadryl and it is not helping  Past Medical History:  Diagnosis Date   Small bowel obstruction (HCC) 2013, 2015    Patient Active Problem List   Diagnosis Date Noted   Abdominal pain 03/29/2020   History of small bowel obstruction    Diarrhea    Cough 06/10/2019   Left shoulder pain 06/10/2019   Otalgia of right ear 01/31/2019   Vision changes 01/31/2019   Chest pain of uncertain etiology 10/05/2012   Hypotension 10/05/2012   HYPERTRIGLYCERIDEMIA, MILD 12/26/2006   OBESITY, UNSPECIFIED 12/26/2006    Past Surgical History:  Procedure Laterality Date   ABDOMINAL HERNIA REPAIR     ABDOMINAL HYSTERECTOMY     [place]   CHOLECYSTECTOMY     Left ankle surgery      OB History   No obstetric history on file.      Home Medications    Prior to Admission medications   Medication Sig Start Date End Date Taking? Authorizing Provider  predniSONE (DELTASONE) 20 MG tablet Take 2 tablets (40 mg total) by mouth daily with breakfast for 5 days. 01/22/22 01/27/22 Yes Zenia Resides, MD  triamcinolone cream (KENALOG) 0.1 % Apply 1 Application topically 2 (two) times daily. 10/07/21  Yes Carlisle Beers, FNP  acetaminophen (TYLENOL) 500 MG tablet Take 500 mg by mouth every 6 (six) hours as needed.    [provider]  fluticasone (FLONASE) 50 MCG/ACT nasal spray Place 2 sprays into both nostrils daily. 01/24/19 08/07/19  Joana Reamer, DO    Family History Family History  Problem  Relation Age of Onset   Cancer Mother        Breast? but patient not sure   Diabetes Father    Diabetes Sister    Early death Brother 34       aneurysm   Heart disease Maternal Grandmother    Diabetes Paternal Grandfather     Social History Social History   Tobacco Use   Smoking status: Never   Smokeless tobacco: Never  Vaping Use   Vaping Use: Never used  Substance Use Topics   Alcohol use: Yes    Comment: rarely   Drug use: No     Allergies   Patient has no known allergies.   Review of Systems Review of Systems  Skin:  Positive for rash.     Physical Exam Triage Vital Signs ED Triage Vitals  Enc Vitals Group     BP 01/22/22 1134 137/85     Pulse Rate 01/22/22 1134 (!) 51     Resp 01/22/22 1134 18     Temp 01/22/22 1134 98 F (36.7 C)     Temp Source 01/22/22 1134 Oral     SpO2 01/22/22 1134 98 %     Weight --      Height --      Head Circumference --  Peak Flow --      Pain Score 01/22/22 1132 0     Pain Loc --      Pain Edu? --      Excl. in [place]? --    No data found.  Updated Vital Signs BP 137/85 (BP Location: Right Arm)   Pulse (!) 51   Temp 98 F (36.7 C) (Oral)   Resp 18   SpO2 98%   Visual Acuity Right Eye Distance:   Left Eye Distance:   Bilateral Distance:    Right Eye Near:   Left Eye Near:    Bilateral Near:     Physical Exam Vitals reviewed.  Constitutional:      General: She is not in acute distress.    Appearance: She is not ill-appearing, toxic-appearing or diaphoretic.  HENT:     Mouth/Throat:     Mouth: Mucous membranes are moist.     Comments: Nl lips and tongue Eyes:     Extraocular Movements: Extraocular movements intact.     Conjunctiva/sclera: Conjunctivae normal.     Pupils: Pupils are equal, round, and reactive to light.  Cardiovascular:     Rate and Rhythm: Normal rate and regular rhythm.     Heart sounds: No murmur heard. Pulmonary:     Effort: Pulmonary effort is normal. No respiratory distress.      Breath sounds: No stridor. No wheezing, rhonchi or rales.  Musculoskeletal:     Cervical back: Neck supple.  Lymphadenopathy:     Cervical: No cervical adenopathy.  Skin:    Coloration: Skin is not jaundiced or pale.     Comments: There are small erythematous urticaria type rash scattered on her chest and abdomen and back.  Neurological:     General: No focal deficit present.     Mental Status: She is alert and oriented to person, place, and time.  Psychiatric:        Behavior: Behavior normal.      UC Treatments / Results  Labs (all labs ordered are listed, but only abnormal results are displayed) Labs Reviewed - No data to display  EKG   Radiology No results found.  Procedures Procedures (including critical care time)  Medications Ordered in UC Medications - No data to display  Initial Impression / Assessment and Plan / UC Course  I have reviewed the triage vital signs and the nursing notes.  Pertinent labs & imaging results that were available during my care of the patient were reviewed by me and considered in my medical decision making (see chart for details).        5 days of prednisone are sent in, and have asked her to try using a less sedating antihistamine as needed. Final Clinical Impressions(s) / UC Diagnoses   Final diagnoses:  Allergic contact dermatitis, unspecified trigger     Discharge Instructions      Take prednisone 20 mg--2 daily for 5 days  Claritin or Allegra may be a better choice for an antihistamine as they do not make you so sleepy (as opposed to Benadryl)  Follow-up with your primary care about this issue, especially if not completely resolving     ED Prescriptions     Medication Sig Dispense Auth. Provider   predniSONE (DELTASONE) 20 MG tablet Take 2 tablets (40 mg total) by mouth daily with breakfast for 5 days. 10 tablet Windy Carina Gwenlyn Perking, MD      PDMP not reviewed this encounter.   Barrett Henle,  MD 01/22/22 1149

## 2022-01-25 ENCOUNTER — Ambulatory Visit: Payer: Medicare Other | Admitting: Obstetrics and Gynecology

## 2022-02-01 ENCOUNTER — Ambulatory Visit
Admission: RE | Admit: 2022-02-01 | Discharge: 2022-02-01 | Disposition: A | Discharge status: Home / Self Care | Payer: 59 | Source: Ambulatory Visit | Attending: Family Medicine | Admitting: Family Medicine

## 2022-02-01 ENCOUNTER — Ambulatory Visit: Payer: Medicare Other

## 2022-02-08 ENCOUNTER — Ambulatory Visit: Payer: 59 | Admitting: Student

## 2022-02-08 ENCOUNTER — Encounter: Payer: Self-pay | Admitting: Student

## 2022-02-08 ENCOUNTER — Ambulatory Visit (INDEPENDENT_AMBULATORY_CARE_PROVIDER_SITE_OTHER): Payer: 59 | Admitting: Student

## 2022-02-08 VITALS — BP 102/64 | HR 74 | Ht 64.0 in | Wt 170.0 lb

## 2022-02-08 DIAGNOSIS — L299 Pruritus, unspecified: Secondary | ICD-10-CM | POA: Insufficient documentation

## 2022-02-08 DIAGNOSIS — Z23 Encounter for immunization: Secondary | ICD-10-CM

## 2022-02-08 MED ORDER — TRIAMCINOLONE ACETONIDE 0.5 % EX OINT: 1.0000 | TOPICAL_OINTMENT | Freq: Every day | CUTANEOUS | 0 refills | Status: DC | PRN

## 2022-02-08 NOTE — Patient Instructions (Signed)
It was great to see you today!   Today we addressed: Mammogram results, it is normal! You should follow up in 1 year for an annual screening.  I am prescribing a steroid cream you can use at home as needed when you have a flare. Continue moisturizing twice daily to prevent dry skin and irritation.   You should return to our clinic Return in about 3 months (around 05/09/2022).  Please arrive 15 minutes before your appointment to ensure smooth check in process.    Please call the clinic at [phone] if your symptoms worsen or you have any concerns.  Thank you for allowing me to participate in your care, Dr. Darci Current [facility]

## 2022-02-08 NOTE — Progress Notes (Deleted)
    SUBJECTIVE:   CHIEF COMPLAINT / HPI:   Brandi Mendez is a 67 y.o. female  presenting for follow up on her mammogram and itchy skin.   Prednisone 20 mg BID for 5 days.    PERTINENT  PMH / PSH: ***  OBJECTIVE:   BP 102/64   Pulse 74   Ht 5\' 4"  (1.626 m)   Wt 170 lb (77.1 kg)   SpO2 98%   BMI 29.18 kg/m   ***-appearing, no acute distress Cardio: Regular rate, *** rhythm, no murmurs on exam. Pulm: Clear, no wheezing, no crackles. No increased work of breathing Abdominal: bowel sounds present, soft, non-tender, non-distended Extremities: no peripheral edema    ASSESSMENT/PLAN:   No problem-specific Assessment & Plan notes found for this encounter.     Darci Current, [place]

## 2022-02-08 NOTE — Progress Notes (Signed)
    SUBJECTIVE:   CHIEF COMPLAINT / HPI:   Brandi Mendez is a 67 y.o. female  presenting for annual visit and follow-up for pruritus.  She recently was seen at urgent care for presumed allergic contact dermatitis.  She was given a 5-day course of prednisone which seemed to help her symptoms significantly.    Pruritus is located on her arms and chest.  She denies new allergic contacts with changing her detergent or soaps.  She normally uses cocoa butter at home for moisturizer once daily.  Condition seems to be worse in the winter and related to dry skin.  PERTINENT  PMH / PSH: Hypertension, hypertriglyceridemia, obesity  OBJECTIVE:   BP 102/64   Pulse 74   Ht 5\' 4"  (1.626 m)   Wt 170 lb (77.1 kg)   SpO2 98%   BMI 29.18 kg/m   Well-appearing, no acute distress Cardio: Regular rate, regular rhythm, no murmurs on exam. Pulm: Clear, no wheezing, no crackles. No increased work of breathing Abdominal: bowel sounds present, soft, non-tender, non-distended Extremities: no peripheral edema Skin: No excoriations, no erythema, no bug bites, no hives  ASSESSMENT/PLAN:   Pruritus Patient's skin is clear on examination.  With benefit from oral steroids, will prescribe topical Kenalog for patient to use as needed with pruritus flares.  Does not appear to be infectious or related to bug bites.  Possibly an allergic component, patient is taking Claritin without significant relief. - Instructed patient to take a picture of her skin during a flare - Educated patient on daily moisturizing to prevent dry skin - Steroid prescription to only be used as needed during a flare - Consider additional lab workup for underlying systemic etiology with CBC/CMP if symptoms return or persist.    Discussed mammogram results with patient.  She will need a repeat mammogram 01/2023.   Darci Current, [place]

## 2022-02-08 NOTE — Assessment & Plan Note (Addendum)
Patient's skin is clear on examination.  With benefit from oral steroids, will prescribe topical Kenalog for patient to use as needed with pruritus flares.  Does not appear to be infectious or related to bug bites.  Possibly an allergic component, patient is taking Claritin without significant relief. - Instructed patient to take a picture of her skin during a flare - Educated patient on daily moisturizing to prevent dry skin - Steroid prescription to only be used as needed during a flare - Consider additional lab workup for underlying systemic etiology with CBC/CMP if symptoms return or persist.

## 2022-02-27 ENCOUNTER — Other Ambulatory Visit: Payer: Self-pay | Admitting: Internal Medicine

## 2022-02-27 DIAGNOSIS — E1129 Type 2 diabetes mellitus with other diabetic kidney complication: Secondary | ICD-10-CM

## 2022-03-03 ENCOUNTER — Other Ambulatory Visit: Payer: Self-pay

## 2022-03-03 DIAGNOSIS — I10 Essential (primary) hypertension: Secondary | ICD-10-CM

## 2022-03-04 MED ORDER — LOSARTAN POTASSIUM-HCTZ 100-25 MG PO TABS: 1.0000 | ORAL_TABLET | Freq: Every day | ORAL | 1 refills | Status: DC

## 2022-04-14 ENCOUNTER — Other Ambulatory Visit: Payer: Medicare Other

## 2022-04-14 ENCOUNTER — Ambulatory Visit: Payer: Medicare Other

## 2022-04-16 ENCOUNTER — Telehealth: Payer: Self-pay | Admitting: Student

## 2022-04-16 NOTE — Telephone Encounter (Signed)
Called patient to schedule Medicare Annual Wellness Visit (AWV). No voicemail available to leave a message.  Last date of AWV: AWVI eligible as of 01/04/2022  Please schedule an AWVI appointment at any time with [facility] VISIT.  If any questions, please contact me at [phone].   Thank you,  [facility] Direct dial  [phone]

## 2022-04-20 NOTE — Progress Notes (Unsigned)
Internal Medicine Center: Clinic Note  Subjective:  History of Present Illness: Janice Massey is a 67 y.o. year old female who presents for 3 month follow up. She is here with an ASL interpreter today.  She's doing great, no concerns today.  She brought her full bag of meds, which we reviewed, and had some differences to the computer list.  She did not tolerate Rybelsus due to GI side effects, so stopped taking this   Please refer to Assessment and Plan below for full details in Problem-Based Charting.   Past Medical History:  Patient Active Problem List   Diagnosis Date Noted   Cervical stenosis (uterine cervix) 02/29/2020   Healthcare maintenance 01/30/2020   Breast lump on left side at 5 o'clock position 07/31/2019   Greater trochanteric pain syndrome 04/02/2015   VSD (ventricular septal defect), perimembranous 05/29/2013   Essential hypertension 08/10/2011   Hyperlipidemia 10/11/2007   Asthma 11/10/2005   GERD 11/10/2005   Deaf 11/10/2005   Diabetes 04/05/2002     Medications:  Current Outpatient Medications:    dorzolamide-timolol (COSOPT) 2-0.5 % ophthalmic solution, SMARTSIG:In Eye(s), Disp: , Rfl:    Empagliflozin-metFORMIN HCl (SYNJARDY) 12.05-998 MG TABS, Take 1 tablet by mouth 2 (two) times daily., Disp: 180 tablet, Rfl: 3   Accu-Chek FastClix Lancets MISC, 1 Units by Does not apply route daily. Use once a daily in morning to check blood glucose, Disp: 102 each, Rfl: 3   aspirin 81 MG chewable tablet, Chew 1 tablet (81 mg total) by mouth daily., Disp: 30 tablet, Rfl: 3   atorvastatin (LIPITOR) 40 MG tablet, Take 1 tablet (40 mg total) by mouth at bedtime., Disp: 90 tablet, Rfl: 3   Blood Glucose Monitoring Suppl (ACCU-CHEK GUIDE) w/Device KIT, 1 Units by Does not apply route daily., Disp: 1 kit, Rfl: 0   glucose blood (ACCU-CHEK GUIDE) test strip, Use as instructed, Disp: 100 each, Rfl: 12   latanoprost (XALATAN) 0.005 % ophthalmic solution, INSTILL 1  DROP IN BOTH EYES EVERY DAY IN THE EVENING (Patient not taking: Reported on 05/01/2021), Disp: 2.5 mL, Rfl: 1   losartan-hydrochlorothiazide (HYZAAR) 100-25 MG tablet, Take 1 tablet by mouth daily., Disp: 90 tablet, Rfl: 1   omeprazole (PRILOSEC) 40 MG capsule, Take 1 capsule (40 mg total) by mouth daily., Disp: 90 capsule, Rfl: 0   polyethylene glycol (MIRALAX) 17 g packet, Take 17 g by mouth 2 (two) times daily., Disp: 14 each, Rfl: 0   senna-docusate (SENOKOT-S) 8.6-50 MG tablet, Take 2 tablets by mouth 2 (two) times daily., Disp: 30 tablet, Rfl: 0   Allergies: Allergies  Allergen Reactions   Codeine Itching   Lisinopril Cough   Penicillins Itching    Has patient had a PCN reaction causing immediate rash, facial/tongue/throat swelling, SOB or lightheadedness with hypotension: YES Has patient had a PCN reaction causing severe rash involving mucus membranes or skin necrosis: NO Has patient had a PCN reaction that required hospitalization NO Has patient had a PCN reaction occurring within the last 10 years: NO If all of the above answers are "NO", then may proceed with Cephalosporin use.   Objective:   Vitals: Vitals:   04/21/22 1046  BP: 108/66  Pulse: 97  Temp: 98.2 F (36.8 C)  SpO2: 97%    Physical Exam: Physical Exam Constitutional:      Appearance: Normal appearance.  Cardiovascular:     Rate and Rhythm: Normal rate and regular rhythm.  Pulmonary:     Effort: Pulmonary  effort is normal.     Breath sounds: Normal breath sounds.  Neurological:     Mental Status: She is alert.      Data: Labs, imaging, and micro were reviewed in Epic. Refer to Assessment and Plan below for full details in Problem-Based Charting.  Assessment & Plan:  Diabetes (HCC) - Increase Synjardy to Empaglifozin 12.5mg  and Metformin  BID - Stop Semaglutide PO, she did not tolerate the  dose - Continue DPP4 Januvia  daily since she's not on GLP1 - Repeat A1C in 3 months. If still  not at goal, I think we need to discuss insulin - Eye exam done last week, she has glaucoma surgery scheduled - Atorvastatin  daily  Essential hypertension - Well controlled today - She is not taking Amlodipine, so I took this off med list - Continue Losartan 100 / HCTZ  daily combo pill   Healthcare maintenance - She is due for her MMG and DEXA. These are ordered, and we gave her info on how to schedule today     Patient will follow up in 3 months for repeat A1C  Mercie Eon, MD

## 2022-04-21 ENCOUNTER — Ambulatory Visit (INDEPENDENT_AMBULATORY_CARE_PROVIDER_SITE_OTHER): Payer: Medicare Other | Admitting: Internal Medicine

## 2022-04-21 ENCOUNTER — Encounter: Payer: Self-pay | Admitting: Internal Medicine

## 2022-04-21 VITALS — BP 108/66 | HR 97 | Temp 98.2°F | Ht 67.0 in | Wt 162.5 lb

## 2022-04-21 DIAGNOSIS — H9193 Unspecified hearing loss, bilateral: Secondary | ICD-10-CM | POA: Diagnosis not present

## 2022-04-21 DIAGNOSIS — E1129 Type 2 diabetes mellitus with other diabetic kidney complication: Secondary | ICD-10-CM

## 2022-04-21 DIAGNOSIS — K219 Gastro-esophageal reflux disease without esophagitis: Secondary | ICD-10-CM | POA: Diagnosis not present

## 2022-04-21 DIAGNOSIS — R809 Proteinuria, unspecified: Secondary | ICD-10-CM

## 2022-04-21 DIAGNOSIS — I10 Essential (primary) hypertension: Secondary | ICD-10-CM | POA: Diagnosis not present

## 2022-04-21 DIAGNOSIS — Z0000 Encounter for general adult medical examination without abnormal findings: Secondary | ICD-10-CM

## 2022-04-21 LAB — POCT GLYCOSYLATED HEMOGLOBIN (HGB A1C): Hemoglobin A1C: 8.2 % — AB (ref 4.0–5.6)

## 2022-04-21 LAB — GLUCOSE, CAPILLARY: Glucose-Capillary: 144 mg/dL — ABNORMAL HIGH (ref 70–99)

## 2022-04-21 MED ORDER — SYNJARDY 12.5-1000 MG PO TABS: 1.0000 | ORAL_TABLET | Freq: Two times a day (BID) | ORAL | 3 refills | Status: AC

## 2022-04-21 MED ORDER — SENNOSIDES-DOCUSATE SODIUM 8.6-50 MG PO TABS: 2.0000 | ORAL_TABLET | Freq: Two times a day (BID) | ORAL | 0 refills | Status: DC

## 2022-04-21 MED ORDER — OMEPRAZOLE 40 MG PO CPDR: 40.0000 mg | DELAYED_RELEASE_CAPSULE | Freq: Every day | ORAL | 0 refills | Status: DC

## 2022-04-21 MED ORDER — POLYETHYLENE GLYCOL 3350 17 G PO PACK: 17.0000 g | PACK | Freq: Two times a day (BID) | ORAL | 0 refills | Status: DC

## 2022-04-21 NOTE — Assessment & Plan Note (Signed)
-   She is due for her MMG and DEXA. These are ordered, and we gave her info on how to schedule today

## 2022-04-21 NOTE — Patient Instructions (Addendum)
Dear Janice Massey,  It was a pleasure seeing you in clinic today.  Your A1C was 8.2 today. I would like this number to be <7, definitely <7.5. I have increased your Synjardy dose and sent the new prescription to your pharmacy. We will repeat your A1C in 3 months at your next visit.   [facility]  You have had 2 Shingrix vaccines. The first was in 2019. The second was in 2022. Please share this with the pharmacist to see if any other vaccines are needed, but I don't think they are today.    I'll see you back in 3 months   Sincerely, Dr. Mercie Eon

## 2022-04-21 NOTE — Assessment & Plan Note (Signed)
-   Increase Synjardy to Empaglifozin 12.5mg  and Metformin  BID - Stop Semaglutide PO, she did not tolerate the  dose - Continue DPP4 Januvia  daily since she's not on GLP1 - Repeat A1C in 3 months. If still not at goal, I think we need to discuss insulin - Eye exam done last week, she has glaucoma surgery scheduled - Atorvastatin  daily

## 2022-04-21 NOTE — Assessment & Plan Note (Signed)
-   Well controlled today - She is not taking Amlodipine, so I took this off med list - Continue Losartan 100 / HCTZ  daily combo pill

## 2022-05-04 ENCOUNTER — Other Ambulatory Visit: Payer: Self-pay | Admitting: Internal Medicine

## 2022-05-04 DIAGNOSIS — R809 Proteinuria, unspecified: Secondary | ICD-10-CM

## 2022-05-08 ENCOUNTER — Other Ambulatory Visit: Payer: Self-pay | Admitting: Student

## 2022-05-08 DIAGNOSIS — K219 Gastro-esophageal reflux disease without esophagitis: Secondary | ICD-10-CM

## 2022-05-16 ENCOUNTER — Encounter (HOSPITAL_COMMUNITY): Payer: Self-pay

## 2022-05-16 ENCOUNTER — Emergency Department (HOSPITAL_COMMUNITY): Payer: 59

## 2022-05-16 ENCOUNTER — Other Ambulatory Visit: Payer: Self-pay

## 2022-05-16 ENCOUNTER — Observation Stay (HOSPITAL_COMMUNITY)
Admission: EM | Admit: 2022-05-16 | Discharge: 2022-05-18 | Disposition: A | Discharge status: Home / Self Care | Payer: 59 | Attending: Family Medicine | Admitting: Family Medicine

## 2022-05-16 DIAGNOSIS — K5669 Other partial intestinal obstruction: Secondary | ICD-10-CM | POA: Insufficient documentation

## 2022-05-16 DIAGNOSIS — K56600 Partial intestinal obstruction, unspecified as to cause: Secondary | ICD-10-CM

## 2022-05-16 DIAGNOSIS — R109 Unspecified abdominal pain: Principal | ICD-10-CM | POA: Insufficient documentation

## 2022-05-16 LAB — CBC
HCT: 39 % (ref 36.0–46.0)
Hemoglobin: 12.7 g/dL (ref 12.0–15.0)
MCH: 30.8 pg (ref 26.0–34.0)
MCHC: 32.6 g/dL (ref 30.0–36.0)
MCV: 94.4 fL (ref 80.0–100.0)
Platelets: 280 10*3/uL (ref 150–400)
RBC: 4.13 MIL/uL (ref 3.87–5.11)
RDW: 12.4 % (ref 11.5–15.5)
WBC: 7.2 10*3/uL (ref 4.0–10.5)
nRBC: 0 % (ref 0.0–0.2)

## 2022-05-16 LAB — URINALYSIS, ROUTINE W REFLEX MICROSCOPIC
Bilirubin Urine: NEGATIVE
Glucose, UA: NEGATIVE mg/dL
Ketones, ur: NEGATIVE mg/dL
Nitrite: NEGATIVE
Protein, ur: NEGATIVE mg/dL
Specific Gravity, Urine: 1.017 (ref 1.005–1.030)
pH: 7 (ref 5.0–8.0)

## 2022-05-16 LAB — COMPREHENSIVE METABOLIC PANEL
ALT: 30 U/L (ref 0–44)
AST: 27 U/L (ref 15–41)
Albumin: 4.3 g/dL (ref 3.5–5.0)
Alkaline Phosphatase: 65 U/L (ref 38–126)
Anion gap: 12 (ref 5–15)
BUN: 20 mg/dL (ref 8–23)
CO2: 26 mmol/L (ref 22–32)
Calcium: 9.5 mg/dL (ref 8.9–10.3)
Chloride: 99 mmol/L (ref 98–111)
Creatinine, Ser: 0.85 mg/dL (ref 0.44–1.00)
GFR, Estimated: >60 mL/min (ref >60)
Glucose, Bld: 124 mg/dL — ABNORMAL HIGH (ref 70–99)
Potassium: 3.5 mmol/L (ref 3.5–5.1)
Sodium: 137 mmol/L (ref 135–145)
Total Bilirubin: 1 mg/dL (ref 0.3–1.2)
Total Protein: 7.5 g/dL (ref 6.5–8.1)

## 2022-05-16 LAB — LIPASE, BLOOD: Lipase: 19 U/L (ref 11–51)

## 2022-05-16 MED ORDER — IOHEXOL 350 MG/ML SOLN
75.0000 mL | Freq: Once | INTRAVENOUS | Status: AC | PRN
  Administered 2022-05-16: 75 mL via INTRAVENOUS

## 2022-05-16 MED ORDER — ONDANSETRON HCL 4 MG/2ML IJ SOLN
4.0000 mg | Freq: Once | INTRAMUSCULAR | Status: AC
  Administered 2022-05-16: 4 mg via INTRAVENOUS
  Filled 2022-05-16: qty 2

## 2022-05-16 MED ORDER — HYDROMORPHONE HCL 1 MG/ML IJ SOLN
1.0000 mg | Freq: Once | INTRAMUSCULAR | Status: AC
  Administered 2022-05-16: 1 mg via INTRAVENOUS
  Filled 2022-05-16: qty 1

## 2022-05-16 MED ORDER — SODIUM CHLORIDE 0.9 % IV BOLUS
1000.0000 mL | Freq: Once | INTRAVENOUS | Status: AC
  Administered 2022-05-16: 1000 mL via INTRAVENOUS

## 2022-05-16 NOTE — ED Triage Notes (Signed)
Patient reports pain across lower abdomen with emesis and diarrhea onset last week , denies fever or chills , no urinary discomfort .

## 2022-05-17 DIAGNOSIS — R109 Unspecified abdominal pain: Secondary | ICD-10-CM | POA: Diagnosis not present

## 2022-05-17 DIAGNOSIS — K5669 Other partial intestinal obstruction: Secondary | ICD-10-CM | POA: Diagnosis not present

## 2022-05-17 DIAGNOSIS — K56600 Partial intestinal obstruction, unspecified as to cause: Secondary | ICD-10-CM | POA: Diagnosis present

## 2022-05-17 LAB — COMPREHENSIVE METABOLIC PANEL
ALT: 26 U/L (ref 0–44)
AST: 24 U/L (ref 15–41)
Albumin: 3.7 g/dL (ref 3.5–5.0)
Alkaline Phosphatase: 56 U/L (ref 38–126)
Anion gap: 10 (ref 5–15)
BUN: 16 mg/dL (ref 8–23)
CO2: 24 mmol/L (ref 22–32)
Calcium: 8.8 mg/dL — ABNORMAL LOW (ref 8.9–10.3)
Chloride: 102 mmol/L (ref 98–111)
Creatinine, Ser: 0.82 mg/dL (ref 0.44–1.00)
GFR, Estimated: >60 mL/min (ref >60)
Glucose, Bld: 143 mg/dL — ABNORMAL HIGH (ref 70–99)
Potassium: 4 mmol/L (ref 3.5–5.1)
Sodium: 136 mmol/L (ref 135–145)
Total Bilirubin: 1 mg/dL (ref 0.3–1.2)
Total Protein: 6.4 g/dL — ABNORMAL LOW (ref 6.5–8.1)

## 2022-05-17 LAB — CBC
HCT: 36.6 % (ref 36.0–46.0)
Hemoglobin: 11.8 g/dL — ABNORMAL LOW (ref 12.0–15.0)
MCH: 31.1 pg (ref 26.0–34.0)
MCHC: 32.2 g/dL (ref 30.0–36.0)
MCV: 96.6 fL (ref 80.0–100.0)
Platelets: 259 10*3/uL (ref 150–400)
RBC: 3.79 MIL/uL — ABNORMAL LOW (ref 3.87–5.11)
RDW: 12.5 % (ref 11.5–15.5)
WBC: 10 10*3/uL (ref 4.0–10.5)
nRBC: 0 % (ref 0.0–0.2)

## 2022-05-17 LAB — HIV ANTIBODY (ROUTINE TESTING W REFLEX): HIV Screen 4th Generation wRfx: NONREACTIVE

## 2022-05-17 MED ORDER — ACETAMINOPHEN 650 MG RE SUPP: 650.0000 mg | Freq: Four times a day (QID) | RECTAL | Status: DC | PRN

## 2022-05-17 MED ORDER — SODIUM CHLORIDE 0.9 % IV SOLN: INTRAVENOUS | Status: AC

## 2022-05-17 MED ORDER — ACETAMINOPHEN 10 MG/ML IV SOLN
1000.0000 mg | Freq: Four times a day (QID) | INTRAVENOUS | Status: DC
  Administered 2022-05-17: 1000 mg via INTRAVENOUS
  Filled 2022-05-17: qty 100

## 2022-05-17 MED ORDER — CHEWING GUM (ORBIT) SUGAR FREE
1.0000 | CHEWING_GUM | Freq: Three times a day (TID) | ORAL | Status: DC
  Administered 2022-05-17 – 2022-05-18 (×2): 1 via ORAL
  Filled 2022-05-17 (×2): qty 1

## 2022-05-17 MED ORDER — HYDROMORPHONE HCL 1 MG/ML IJ SOLN
0.5000 mg | INTRAMUSCULAR | Status: DC | PRN
  Administered 2022-05-17: 0.5 mg via INTRAVENOUS
  Filled 2022-05-17: qty 0.5

## 2022-05-17 MED ORDER — ONDANSETRON HCL 4 MG/2ML IJ SOLN: 4.0000 mg | Freq: Four times a day (QID) | INTRAMUSCULAR | Status: DC | PRN

## 2022-05-17 MED ORDER — ACETAMINOPHEN 325 MG PO TABS: 650.0000 mg | ORAL_TABLET | Freq: Four times a day (QID) | ORAL | Status: DC | PRN

## 2022-05-17 MED ORDER — ONDANSETRON HCL 4 MG PO TABS: 4.0000 mg | ORAL_TABLET | Freq: Four times a day (QID) | ORAL | Status: DC | PRN

## 2022-05-17 MED ORDER — KETOROLAC TROMETHAMINE 15 MG/ML IJ SOLN
15.0000 mg | Freq: Three times a day (TID) | INTRAMUSCULAR | Status: DC
  Administered 2022-05-17: 15 mg via INTRAVENOUS
  Filled 2022-05-17: qty 1

## 2022-05-17 MED ORDER — GLYCERIN (LAXATIVE) 2 G RE SUPP
1.0000 | Freq: Once | RECTAL | Status: AC
  Administered 2022-05-17: 1 via RECTAL
  Filled 2022-05-17: qty 1

## 2022-05-17 MED ORDER — ACETAMINOPHEN 500 MG PO TABS: 1000.0000 mg | ORAL_TABLET | Freq: Four times a day (QID) | ORAL | Status: DC | PRN

## 2022-05-17 MED ORDER — ACETAMINOPHEN 500 MG PO TABS
1000.0000 mg | ORAL_TABLET | Freq: Four times a day (QID) | ORAL | Status: DC
  Administered 2022-05-18: 1000 mg via ORAL
  Filled 2022-05-17 (×2): qty 2

## 2022-05-17 MED ORDER — ENOXAPARIN SODIUM 40 MG/0.4ML IJ SOSY
40.0000 mg | PREFILLED_SYRINGE | INTRAMUSCULAR | Status: DC
  Administered 2022-05-17: 40 mg via SUBCUTANEOUS
  Filled 2022-05-17: qty 0.4

## 2022-05-17 MED ORDER — KETOROLAC TROMETHAMINE 15 MG/ML IJ SOLN
15.0000 mg | Freq: Three times a day (TID) | INTRAMUSCULAR | Status: DC | PRN
  Administered 2022-05-17: 15 mg via INTRAVENOUS
  Filled 2022-05-17 (×2): qty 1

## 2022-05-17 NOTE — Care Management Obs Status (Signed)
MEDICARE OBSERVATION STATUS NOTIFICATION   Patient Details  Name: TITANNA AUGSPURGER MRN: [id number] Date of Birth: 21-Mar-1955   Medicare Observation Status Notification Given:       Harriet Masson, RN 05/17/2022, 3:41 PM

## 2022-05-17 NOTE — Progress Notes (Signed)
  [facility] Screening Note   Patient Details  Name: Brandi Mendez Date of Birth: 02-May-1955   Transition of Care [facility]) CM/SW Contact:    Harriet Masson, RN Phone Number: 05/17/2022, 9:36 AM    Transition of Care Department [facility]) has reviewed patient and no TOC needs have been identified at this time. We will continue to monitor patient advancement through interdisciplinary progression rounds. If new patient transition needs arise, please place a TOC consult. Partial bowel obstruction secondary to adhesions from her previous hernia repair. Surgery consult.

## 2022-05-17 NOTE — Plan of Care (Signed)

## 2022-05-17 NOTE — Progress Notes (Addendum)
     Daily Progress Note Intern Pager: [phone]  Patient name: Brandi Mendez Medical record number: [id number] Date of birth: 07/04/1955 Age: 67 y.o. Gender: female  Primary Care Provider: Bess Kinds, MD Consultants: none Code Status: Full  Pt Overview and Major Events to Date:  5/13: Admitted today after MN.  Assessment and Plan: [name] is a 67 y.o. female admitted for abm pain, N/V, and constipation in setting of potential partial SBO.  Pertinent PMH/PSH includes hernia repair, hx of SBO, hysterectomy, cholecystectomy.   * Partial bowel obstruction (HCC) Continues to pass gas and had 1 small formed BM today. No peritoneal signs on exam. VSS. Overall, low c/f true SBO. Suspect that constipation/stool burden is contributing to symptoms. Will treat constipation and advance diet as tolerated (pt feels ready to try eating). - Tylenol PO sch q6h for pain - Toradol prn q8h for pain - Zofran prn for N/V - Glycerin suppository - Chewing gum - Consider adding Miralax if pt tolerating PO - Stop Dilaudid - If pain or N/V acutely worsens, consider Gen Surg consult and NG tube   FEN/GI: Clear liquid diet PPx: Lovenox Dispo:Pending PT recommendations .  Requires hospitalization due to inability to tolerate p.o. and need for IV pain medication.  Subjective:  Reports that pain is improved after pain meds.  Reports having 1 small formed stool this afternoon, also passing gas overnight.  Still feels nauseous, but hungry and wants to try some food.  Objective: Temp:  [97.7 F (36.5 C)-98.5 F (36.9 C)] 98 F (36.7 C) (05/13 1459) Pulse Rate:  [50-70] 50 (05/13 1459) Resp:  [18-22] 18 (05/13 0745) BP: (90-158)/(60-75) 106/70 (05/13 1459) SpO2:  [95 %-100 %] 99 % (05/13 1459) Physical Exam: General: Alert, pleasant older woman laying in bed.  NAD. HEENT: NCAT.  MMM. Cardiovascular: RRR, no murmurs Respiratory: CTAB. Normal WOB on RA.  Abdomen: Tender to palpation in RUQ,  LUQ, and LLQ. Soft.  No guarding, rebound, rigidity.  Bowel sounds present.   Laboratory: Most recent CBC Lab Results  Component Value Date   WBC 10.0 05/17/2022   HGB 11.8 (L) 05/17/2022   HCT 36.6 05/17/2022   MCV 96.6 05/17/2022   PLT 259 05/17/2022   Most recent BMP    Latest Ref Rng & Units 05/17/2022    2:34 AM  BMP  Glucose 70 - 99 mg/dL 130   BUN 8 - 23 mg/dL 16   Creatinine 8.65 - 1.00 mg/dL 7.84   Sodium 696 - 295 mmol/L 136   Potassium 3.5 - 5.1 mmol/L 4.0   Chloride 98 - 111 mmol/L 102   CO2 22 - 32 mmol/L 24   Calcium 8.9 - 10.3 mg/dL 8.8    Lincoln Brigham, MD 05/17/2022, 3:24 PM  PGY-1, [facility] FPTS Intern pager: [phone], text pages welcome Secure chat group [facility] Teaching Service

## 2022-05-17 NOTE — ED Provider Notes (Signed)
[place] EMERGENCY DEPARTMENT AT [facility] Provider Note   CSN: [id number] Arrival date & time: 05/16/22  2112     History  Chief Complaint  Patient presents with   Abdominal Pain    Brandi Mendez is a 67 y.o. female.  HPI   Patient with medical history including bowel obstruction, status post hysterectomy, cholecystectomy, abdominal repair, presenting with complaints of abdominal pain nausea and vomiting.  Been going on for about a week's time, states that she has been unable to tolerate p.o., she states that she has been constipated up until today, she states he has some diarrhea, denies bloody stools or dark tarry stools, she is endorsing any urinary symptoms, no associated fevers chills cough congestion, body aches, no recent sick contacts, denies eating any abnormal foods.   Home Medications Prior to Admission medications   Medication Sig Start Date End Date Taking? Authorizing Provider  acetaminophen (TYLENOL) 500 MG tablet Take 500 mg by mouth every 6 (six) hours as needed.    [provider]  triamcinolone cream (KENALOG) 0.1 % Apply 1 Application topically 2 (two) times daily. 10/07/21   Carlisle Beers, FNP  triamcinolone ointment (KENALOG) 0.5 % Apply 1 Application topically daily as needed. 02/08/22   Glendale Chard, DO  fluticasone (FLONASE) 50 MCG/ACT nasal spray Place 2 sprays into both nostrils daily. 01/24/19 08/07/19  Mullis, Kiersten P, DO      Allergies    Patient has no known allergies.    Review of Systems   Review of Systems  Constitutional:  Negative for chills and fever.  Respiratory:  Negative for shortness of breath.   Cardiovascular:  Negative for chest pain.  Gastrointestinal:  Positive for abdominal pain, diarrhea, nausea and vomiting.  Neurological:  Negative for headaches.    Physical Exam Updated Vital Signs BP 103/62   Pulse 63   Temp 98.5 F (36.9 C) (Oral)   Resp 18   SpO2 95%  Physical Exam Vitals and nursing  note reviewed.  Constitutional:      General: She is not in acute distress.    Appearance: She is not ill-appearing.  HENT:     Head: Normocephalic and atraumatic.     Nose: No congestion.  Eyes:     Conjunctiva/sclera: Conjunctivae normal.  Cardiovascular:     Rate and Rhythm: Normal rate and regular rhythm.     Pulses: Normal pulses.     Heart sounds: No murmur heard.    No friction rub. No gallop.  Pulmonary:     Effort: No respiratory distress.     Breath sounds: No wheezing, rhonchi or rales.  Abdominal:     General: There is distension.     Palpations: Abdomen is soft.     Tenderness: There is abdominal tenderness. There is no right CVA tenderness or left CVA tenderness.     Comments: Abdomen distended, normal bowel sounds, notable tenderness in the left upper and left lower quadrants, slight guarding without rebound tenderness or peritoneal sign.  Musculoskeletal:     Right lower leg: No edema.     Left lower leg: No edema.  Skin:    General: Skin is warm and dry.  Neurological:     Mental Status: She is alert.  Psychiatric:        Mood and Affect: Mood normal.     ED Results / Procedures / Treatments   Labs (all labs ordered are listed, but only abnormal results are displayed) Labs Reviewed  COMPREHENSIVE METABOLIC PANEL - Abnormal; Notable for the following components:      Result Value   Glucose, Bld 124 (*)    All other components within normal limits  URINALYSIS, ROUTINE W REFLEX MICROSCOPIC - Abnormal; Notable for the following components:   Hgb urine dipstick MODERATE (*)    Leukocytes,Ua LARGE (*)    Bacteria, UA FEW (*)    All other components within normal limits  URINE CULTURE  LIPASE, BLOOD  CBC    EKG None  Radiology CT ABDOMEN PELVIS W CONTRAST  Result Date: 05/17/2022 CLINICAL DATA:  Bowel obstruction suspected. EXAM: CT ABDOMEN AND PELVIS WITH CONTRAST TECHNIQUE: Multidetector CT imaging of the abdomen and pelvis was performed using the  standard protocol following bolus administration of intravenous contrast. RADIATION DOSE REDUCTION: This exam was performed according to the departmental dose-optimization program which includes automated exposure control, adjustment of the mA and/or kV according to patient size and/or use of iterative reconstruction technique. CONTRAST:  75mL OMNIPAQUE IOHEXOL 350 MG/ML SOLN COMPARISON:  03/28/2020. FINDINGS: Lower chest: Mild atelectasis at the lung bases. Hepatobiliary: A subcentimeter hypodensity is present in the right lobe of the liver which is too small to further characterize. There is mild fatty infiltration of the liver. No biliary ductal dilatation. The gallbladder is surgically absent. Pancreas: Pancreatic atrophy is noted. No pancreatic ductal dilatation or surrounding inflammatory changes. Spleen: Normal in size without focal abnormality. Adrenals/Urinary Tract: No adrenal nodule or mass. The kidneys enhance symmetrically. No renal calculus or hydronephrosis. There is a cyst in the lower pole of the left kidney. The bladder is unremarkable. Stomach/Bowel: The stomach is within normal limits. Mildly distended loops of small bowel are noted in the mid left abdomen measuring up to 3.8 cm in diameter with air-fluid levels with possible transition point in the anterior abdomen in the midline, similar in appearance to the prior exam. No free air or pneumatosis. Appendix appears normal. Vascular/Lymphatic: Aortic atherosclerosis. No abdominal or pelvic lymphadenopathy. Reproductive: Status post hysterectomy. No adnexal masses. Other: A trace amount of free fluid is noted in the mesenteric folds in the mid left abdomen. A fat containing ventral abdominal wall hernia is noted in the midline superior to the umbilicus. Musculoskeletal: No acute osseous abnormality. IMPRESSION: 1. Distended loops of small bowel in the mid left abdomen with air-fluid levels with possible transition in the and mid anterior mid  abdomen, similar in appearance to multiple prior exams, possible partial or early obstruction. Surgical consultation is recommended. 2. Hepatic steatosis. 3. Left renal cyst. 4. Aortic atherosclerosis. Critical Value/emergent results were called by telephone at the time of interpretation on 05/17/2022 at 12:25 am to provider Berle Mull , who verbally acknowledged these results. Electronically Signed   By: Thornell Sartorius M.D.   On: 05/17/2022 00:25    Procedures Procedures    Medications Ordered in ED Medications  HYDROmorphone (DILAUDID) injection 1 mg (1 mg Intravenous Given 05/16/22 2337)  ondansetron (ZOFRAN) injection 4 mg (4 mg Intravenous Given 05/16/22 2337)  sodium chloride 0.9 % bolus 1,000 mL (1,000 mLs Intravenous New Bag/Given 05/16/22 2338)  iohexol (OMNIPAQUE) 350 MG/ML injection 75 mL (75 mLs Intravenous Contrast Given 05/16/22 2353)    ED Course/ Medical Decision Making/ A&P                             Medical Decision Making Amount and/or Complexity of Data Reviewed Labs: ordered. Radiology: ordered. ECG/medicine tests: ordered.  Risk Prescription drug management. Decision regarding hospitalization.   This patient presents to the ED for concern of abdominal pain, this involves an extensive number of treatment options, and is a complaint that carries with it a high risk of complications and morbidity.  The differential diagnosis includes bowel obstruction, volvulus, diverticulitis, kidney stone, pyelo-    Additional history obtained:  Additional history obtained from N/A External records from outside source obtained and reviewed including recent hospitalization   Co morbidities that complicate the patient evaluation  Bowel obstruction  Social Determinants of Health:  N/A    Lab Tests:  I Ordered, and personally interpreted labs.  The pertinent results include: CBC is unremarkable, CMP reveals glucose of 124, lipase 19, UA shows leukocyte red blood  cells white blood cells few bacteria   Imaging Studies ordered:  I ordered imaging studies including CT AP I independently visualized and interpreted imaging which showed distended loops of small bowel, consistent likely obstruction I agree with the radiologist interpretation   Cardiac Monitoring:  The patient was maintained on a cardiac monitor.  I personally viewed and interpreted the cardiac monitored which showed an underlying rhythm of: N/A   Medicines ordered and prescription drug management:  I ordered medication including fluids, pain medications, antiemetics I have reviewed the patients home medicines and have made adjustments as needed  Critical Interventions:  N/A   Reevaluation:  Abdominal pain, triggers obtain basic lab workup which I personally viewed is unremarkable, patient had a slightly distended abdomen and appears to be uncomfortable, due to her history of bowel obstruction I am concerned for recurrence, will send down for CT imaging for further evaluation  Updated patient on CT imaging, recommend admission for further management of her bowel obstruction and she is agreement this plan.   Consultations Obtained:  I requested consultation with the Dr. Gerlene Burdock son MD,  and discussed lab and imaging findings as well as pertinent plan - they recommend: Will admit.    Test Considered:  N/A    Rule out Suspicion for choledocholithiasis low at this time as she has no right quadrant tenderness, no ductal dilation seen on CT imaging.  I doubt pancreatitis as lipase within normal limits.  Suspicion for UTI Pilo kidney stone is low at this time not Dors any urinary symptoms, there is no noted stone remnants of pyeloseen on CT imaging.  Her UA slightly abnormal, will defer on antibiotics at this time but culture urine for further assessment.  I doubt patient needs NG tube at this time as she not having any nausea or vomiting, currently resting comfortably.  I do  not feel patient needs emergent surgical consultation at this time as she has a nonsurgical abdomen, patient is resting comfortably after pain medication, no evidence of bowel ischemia or surgical abdomen.    Dispostion and problem list  After consideration of the diagnostic results and the patients response to treatment, I feel that the patent would benefit from admission.  Bowel obstruction-likely secondary due to adhesions from hernia repair, patient will need continued bowel rest, plus or minus NG tube if symptoms are worsening, will need formal consultation by general surgery.  I suspect patient might need surgery during this hospital stay since this is her second presentation.            Final Clinical Impression(s) / ED Diagnoses Final diagnoses:  Partial intestinal obstruction, unspecified cause (HCC)    Rx / DC Orders ED Discharge Orders     None  Carroll Sage, PA-C 05/17/22 0214    Shon Baton, MD 05/17/22 [phone]

## 2022-05-17 NOTE — Hospital Course (Addendum)
Brandi Mendez is a 67 y.o.female with no relevant PMHx who was admitted to the [facility] Teaching Service at [facility] for abdominal pain. Her hospital course is detailed below:  Abdominal Pain  Concern for partial SBO Presented to ED with 1 week history of abm pain, N/V, and constipation. Obtained Abdominal CT in ED that showed concern for partial SBO. Has had one in remote past (2 years ago). She was still passing flatus and was able to slowly advance diet as tolerated with appropriate pain control and antiemetics. Did not need NGT nor surgical intervention. She was able to improve without need of IVF for hydration. On discharge, tolerated *** diet and had appropriate BM.    Other chronic conditions were medically managed with home medications and formulary alternatives as necessary   PCP Follow-up Recommendations: Pain control and diet advancement

## 2022-05-17 NOTE — ED Notes (Signed)
ED TO INPATIENT HANDOFF REPORT  ED Nurse Name and Phone #: Darryll Capers, RN [phone]  S Name/Age/Gender Brandi Mendez 67 y.o. female Room/Bed: 016C/016C  Code Status   Code Status: Full Code  Home/SNF/Other Home Patient oriented to: self, place, time, and situation Is this baseline? Yes   Triage Complete: Triage complete  Chief Complaint Bowel obstruction [facility]) [K56.609]  Triage Note Patient reports pain across lower abdomen with emesis and diarrhea onset last week , denies fever or chills , no urinary discomfort .    Allergies No Known Allergies  Level of Care/Admitting Diagnosis ED Disposition     ED Disposition  Admit   Condition  --   Comment  Hospital Area: [facility] [[id number]]  Level of Care: Med-Surg [16]  May place patient in observation at [facility] or [facility] if equivalent level of care is available:: Yes  Covid Evaluation: Asymptomatic - no recent exposure (last 10 days) testing not required  Diagnosis: Bowel obstruction [facility]) [[id number]]  Admitting Physician: Lance Muss [phone]  Attending Physician: Ivery Quale          B Medical/Surgery History Past Medical History:  Diagnosis Date   Small bowel obstruction (HCC) 2013, 2015   Past Surgical History:  Procedure Laterality Date   ABDOMINAL HERNIA REPAIR     ABDOMINAL HYSTERECTOMY     [place]   CHOLECYSTECTOMY     Left ankle surgery       A IV Location/Drains/Wounds Patient Lines/Drains/Airways Status     Active Line/Drains/Airways     Name Placement date Placement time Site Days   Peripheral IV 05/16/22 20 G 1" Right Antecubital 05/16/22  2336  Antecubital  1            Intake/Output Last 24 hours  Intake/Output Summary (Last 24 hours) at 05/17/2022 0865 Last data filed at 05/17/2022 0148 Gross per 24 hour  Intake 999 ml  Output --  Net 999 ml    Labs/Imaging Results for orders placed or performed during the hospital  encounter of 05/16/22 (from the past 48 hour(s))  Lipase, blood     Status: None   Collection Time: 05/16/22  9:39 PM  Result Value Ref Range   Lipase 19 11 - 51 U/L    Comment: Performed at [facility], [address], [place], [place] [address]  Comprehensive metabolic panel     Status: Abnormal   Collection Time: 05/16/22  9:39 PM  Result Value Ref Range   Sodium 137 135 - 145 mmol/L   Potassium 3.5 3.5 - 5.1 mmol/L   Chloride 99 98 - 111 mmol/L   CO2 26 22 - 32 mmol/L   Glucose, Bld 124 (H) 70 - 99 mg/dL    Comment: Glucose reference range applies only to samples taken after fasting for at least 8 hours.   BUN 20 8 - 23 mg/dL   Creatinine, Ser 6.29 0.44 - 1.00 mg/dL   Calcium 9.5 8.9 - 52.8 mg/dL   Total Protein 7.5 6.5 - 8.1 g/dL   Albumin 4.3 3.5 - 5.0 g/dL   AST 27 15 - 41 U/L   ALT 30 0 - 44 U/L   Alkaline Phosphatase 65 38 - 126 U/L   Total Bilirubin 1.0 0.3 - 1.2 mg/dL   GFR, Estimated >41 >32 mL/min    Comment: (NOTE) Calculated using the CKD-EPI Creatinine Equation (2021)    Anion gap 12 5 - 15  Comment: Performed at [facility], [address], [place], [place] [address]  CBC     Status: None   Collection Time: 05/16/22  9:39 PM  Result Value Ref Range   WBC 7.2 4.0 - 10.5 K/uL   RBC 4.13 3.87 - 5.11 MIL/uL   Hemoglobin 12.7 12.0 - 15.0 g/dL   HCT 13.0 86.5 - 78.4 %   MCV 94.4 80.0 - 100.0 fL   MCH 30.8 26.0 - 34.0 pg   MCHC 32.6 30.0 - 36.0 g/dL   RDW 69.6 29.5 - 28.4 %   Platelets 280 150 - 400 K/uL   nRBC 0.0 0.0 - 0.2 %    Comment: Performed at [facility], [address], [place], [place] [address]  Urinalysis, Routine w reflex microscopic -Urine, Clean Catch     Status: Abnormal   Collection Time: 05/16/22  9:41 PM  Result Value Ref Range   Color, Urine YELLOW YELLOW   APPearance CLEAR CLEAR   Specific Gravity, Urine 1.017 1.005 - 1.030   pH 7.0 5.0 - 8.0   Glucose, UA NEGATIVE NEGATIVE mg/dL   Hgb urine dipstick MODERATE  (A) NEGATIVE   Bilirubin Urine NEGATIVE NEGATIVE   Ketones, ur NEGATIVE NEGATIVE mg/dL   Protein, ur NEGATIVE NEGATIVE mg/dL   Nitrite NEGATIVE NEGATIVE   Leukocytes,Ua LARGE (A) NEGATIVE   RBC / HPF 11-20 0 - 5 RBC/hpf   WBC, UA 21-50 0 - 5 WBC/hpf   Bacteria, UA FEW (A) NONE SEEN   Squamous Epithelial / HPF 0-5 0 - 5 /HPF   Mucus PRESENT    Hyaline Casts, UA PRESENT     Comment: Performed at [facility], [address] [place], [place] [address]  CBC     Status: Abnormal   Collection Time: 05/17/22  2:34 AM  Result Value Ref Range   WBC 10.0 4.0 - 10.5 K/uL   RBC 3.79 (L) 3.87 - 5.11 MIL/uL   Hemoglobin 11.8 (L) 12.0 - 15.0 g/dL   HCT 25.3 66.4 - 40.3 %   MCV 96.6 80.0 - 100.0 fL   MCH 31.1 26.0 - 34.0 pg   MCHC 32.2 30.0 - 36.0 g/dL   RDW 47.4 25.9 - 56.3 %   Platelets 259 150 - 400 K/uL   nRBC 0.0 0.0 - 0.2 %    Comment: Performed at [facility], [address], [place], [place] [address]   CT ABDOMEN PELVIS W CONTRAST  Result Date: 05/17/2022 CLINICAL DATA:  Bowel obstruction suspected. EXAM: CT ABDOMEN AND PELVIS WITH CONTRAST TECHNIQUE: Multidetector CT imaging of the abdomen and pelvis was performed using the standard protocol following bolus administration of intravenous contrast. RADIATION DOSE REDUCTION: This exam was performed according to the departmental dose-optimization program which includes automated exposure control, adjustment of the mA and/or kV according to patient size and/or use of iterative reconstruction technique. CONTRAST:  75mL OMNIPAQUE IOHEXOL 350 MG/ML SOLN COMPARISON:  03/28/2020. FINDINGS: Lower chest: Mild atelectasis at the lung bases. Hepatobiliary: A subcentimeter hypodensity is present in the right lobe of the liver which is too small to further characterize. There is mild fatty infiltration of the liver. No biliary ductal dilatation. The gallbladder is surgically absent. Pancreas: Pancreatic atrophy is noted. No pancreatic ductal  dilatation or surrounding inflammatory changes. Spleen: Normal in size without focal abnormality. Adrenals/Urinary Tract: No adrenal nodule or mass. The kidneys enhance symmetrically. No renal calculus or hydronephrosis. There is a cyst in the  lower pole of the left kidney. The bladder is unremarkable. Stomach/Bowel: The stomach is within normal limits. Mildly distended loops of small bowel are noted in the mid left abdomen measuring up to 3.8 cm in diameter with air-fluid levels with possible transition point in the anterior abdomen in the midline, similar in appearance to the prior exam. No free air or pneumatosis. Appendix appears normal. Vascular/Lymphatic: Aortic atherosclerosis. No abdominal or pelvic lymphadenopathy. Reproductive: Status post hysterectomy. No adnexal masses. Other: A trace amount of free fluid is noted in the mesenteric folds in the mid left abdomen. A fat containing ventral abdominal wall hernia is noted in the midline superior to the umbilicus. Musculoskeletal: No acute osseous abnormality. IMPRESSION: 1. Distended loops of small bowel in the mid left abdomen with air-fluid levels with possible transition in the and mid anterior mid abdomen, similar in appearance to multiple prior exams, possible partial or early obstruction. Surgical consultation is recommended. 2. Hepatic steatosis. 3. Left renal cyst. 4. Aortic atherosclerosis. Critical Value/emergent results were called by telephone at the time of interpretation on 05/17/2022 at 12:25 am to provider Berle Mull , who verbally acknowledged these results. Electronically Signed   By: Thornell Sartorius M.D.   On: 05/17/2022 00:25    Pending Labs Unresulted Labs (From admission, onward)     Start     Ordered   05/17/22 0500  Comprehensive metabolic panel  Tomorrow morning,   R        05/17/22 0206   05/17/22 0157  HIV Antibody (routine testing w rflx)  (HIV Antibody (Routine testing w reflex) panel)  Once,   R        05/17/22 0206    05/17/22 0117  Urine Culture  Once,   URGENT       Question:  Indication  Answer:  Dysuria   05/17/22 0116            Vitals/Pain Today's Vitals   05/16/22 2338 05/16/22 2343 05/16/22 2345 05/16/22 2357  BP:  98/64 103/62 103/62  Pulse:  66 63 63  Resp:    18  Temp:    98.5 F (36.9 C)  TempSrc:    Oral  SpO2:  95% 95% 95%  PainSc: 7    5     Isolation Precautions No active isolations  Medications Medications  enoxaparin (LOVENOX) injection 40 mg (has no administration in time range)  acetaminophen (TYLENOL) tablet 650 mg (has no administration in time range)    Or  acetaminophen (TYLENOL) suppository 650 mg (has no administration in time range)  ondansetron (ZOFRAN) tablet 4 mg (has no administration in time range)    Or  ondansetron (ZOFRAN) injection 4 mg (has no administration in time range)  HYDROmorphone (DILAUDID) injection 0.5-1 mg (has no administration in time range)  0.9 %  sodium chloride infusion ( Intravenous New Bag/Given 05/17/22 0308)  HYDROmorphone (DILAUDID) injection 1 mg (1 mg Intravenous Given 05/16/22 2337)  ondansetron (ZOFRAN) injection 4 mg (4 mg Intravenous Given 05/16/22 2337)  sodium chloride 0.9 % bolus 1,000 mL (0 mLs Intravenous Stopped 05/17/22 0148)  iohexol (OMNIPAQUE) 350 MG/ML injection 75 mL (75 mLs Intravenous Contrast Given 05/16/22 2353)    Mobility walks     Focused Assessments GI   R Recommendations: See Admitting Provider Note  Report given to:   Additional Notes: Corrie Dandy , RN [phone]

## 2022-05-17 NOTE — Assessment & Plan Note (Addendum)
Continues to pass gas and had 1 small formed BM today. No peritoneal signs on exam. VSS. Overall, low c/f true SBO. Suspect that constipation/stool burden is contributing to symptoms. Will treat constipation and advance diet as tolerated (pt feels ready to try eating). - Tylenol PO sch q6h for pain - Toradol prn q8h for pain - Zofran prn for N/V - Glycerin suppository - Chewing gum - Consider adding Miralax if pt tolerating PO - Stop Dilaudid - If pain or N/V acutely worsens, consider Gen Surg consult and NG tube

## 2022-05-17 NOTE — H&P (Addendum)
Hospital Admission History and Physical Service Pager: [phone]  Patient name: Brandi Mendez Medical record number: [id number] Date of Birth: Jan 08, 1955 Age: 67 y.o. Gender: female  Primary Care Provider: Bess Kinds, MD Consultants: None Code Status: FULL Preferred Emergency Contact: daughter Contact Information     Name Relation Home Work [place] Daughter [phone]  [phone]   [place] [phone]          Chief Complaint: abdominal pain  Assessment and Plan: ZANILAH DRAPKIN is a 67 y.o. female presenting with abdominal pain, likely partial small bowel obstruction given patient's history of small bowel obstruction likely secondary to adhesions from prior hernia repair surgery.  * Bowel obstruction (HCC) Patient has midline abdominal pain for the past week. She also vomited around the time she began noticing the abdominal pain. Patient reports having 2-3 episodes of loose stool prior to admission but before today had no BM for a week. Patient was admitted 03/2020 due to non-obstructed small bowel obstruction. On exam, patient has generalized abdominal tenderness but was able to palpate throughout all quadrants. She is hemodynamically stable, afebrile. CTAP this admission is suggestive of partial obstruction. At this time, patient most likely has partial bowel obstruction secondary to adhesions from her previous hernia repair. At this time, will keep her NPO with plan to consult surgery tomorrow. -Admit to FMTS, attending Dr. Deirdre Priest -IVF for 10 hours -Consult surgery in AM, may need surgical intervention given recurrence -consider NG tube placement if clinical worsening (reports no further vomiting since day of symptom onset about 1 week ago) -Tylenol and Dilaudid PRN for pain -Zofran PRN for nausea -NPO with ice chips -Vital signs -AM CBC, CMP     FEN/GI: NPO except ice chips for possible surgery VTE Prophylaxis: lovenox  Disposition:  med surg  History of Present Illness:  Brandi Mendez is a 67 y.o. female presenting with abdominal pain.  Patient reports she has had several midline abdominal pain for about 1 week. Feels like it is "knotting up". She reports feeling similarly in the past when she had a bowel obstruction. She reports she had a BM with loose stool just prior to coming to the ED. However, prior to having the BM today, she denies having BM for a week. Reports still passing gas. Has had associated nausea one week ago. Denies melena or hematochezia. Patient takes no medications at home.  In the ED, CTAP done showing distended loops of small bowel in the mid left abdomen similar to prior exams, possible for partial or early obstruction. One dose of dilaudid and zofran given. Given a liter bolus of NaCl.  Review Of Systems: Per HPI with the following additions: Denies fever, chills.  Pertinent Past Medical History: SBO Remainder reviewed in history tab.   Pertinent Past Surgical History: Hernia repair  Hysterectomy Cholecystectomy Remainder reviewed in history tab.  Pertinent Social History: Tobacco use: Yes/No/Former Alcohol use: occasional Other Substance use: denies Lives with daughter and grandchildren  Pertinent Family History: Problem Relation Age of Onset   Cancer Mother          Breast? but patient not sure   Diabetes Father     Diabetes Sister     Early death Brother 38        aneurysm   Heart disease Maternal Grandmother     Diabetes Paternal Grandfather     Remainder reviewed in history tab.   Important Outpatient Medications: None  Objective: BP 103/62  Pulse 63   Temp 98.5 F (36.9 C) (Oral)   Resp 18   SpO2 95%  Exam: General: elderly African American female, NAD Cardiovascular: RRR, no m/r/g Respiratory: Normal effort, on RA Gastrointestinal: hypoactive bowel sounds, some tenderness on palpation, no rebound tenderness MSK: no LE edema   Labs:  CBC BMET  Recent Labs   Lab 05/16/22 2139  WBC 7.2  HGB 12.7  HCT 39.0  PLT 280   Recent Labs  Lab 05/16/22 2139  NA 137  K 3.5  CL 99  CO2 26  BUN 20  CREATININE 0.85  GLUCOSE 124*  CALCIUM 9.5    Pertinent additional labs:  Lipase WNL  EKG: pending   Imaging Studies Performed:  CTAP with contrast IMPRESSION: 1. Distended loops of small bowel in the mid left abdomen with air-fluid levels with possible transition in the and mid anterior mid abdomen, similar in appearance to multiple prior exams, possible partial or early obstruction. Surgical consultation is recommended. 2. Hepatic steatosis. 3. Left renal cyst. 4. Aortic atherosclerosis.   Lance Muss, MD 05/17/2022, 2:28 AM PGY-1, [facility] Family Medicine  FPTS Intern pager: [phone], text pages welcome Secure chat group [facility] Teaching Service   Upper Level Addendum:  I have reviewed the above note, making necessary revisions as appropriate.  I agree with the medical decision making and physical exam by the resident as noted above.  Littie Deeds, MD PGY-3 [facility] Family Medicine Residency

## 2022-05-18 ENCOUNTER — Other Ambulatory Visit: Payer: Self-pay | Admitting: Student

## 2022-05-18 DIAGNOSIS — K56600 Partial intestinal obstruction, unspecified as to cause: Secondary | ICD-10-CM

## 2022-05-18 DIAGNOSIS — R109 Unspecified abdominal pain: Secondary | ICD-10-CM | POA: Diagnosis not present

## 2022-05-18 DIAGNOSIS — Z1211 Encounter for screening for malignant neoplasm of colon: Secondary | ICD-10-CM

## 2022-05-18 LAB — BASIC METABOLIC PANEL
Anion gap: 9 (ref 5–15)
BUN: 8 mg/dL (ref 8–23)
CO2: 23 mmol/L (ref 22–32)
Calcium: 8.9 mg/dL (ref 8.9–10.3)
Chloride: 107 mmol/L (ref 98–111)
Creatinine, Ser: 0.72 mg/dL (ref 0.44–1.00)
GFR, Estimated: >60 mL/min (ref >60)
Glucose, Bld: 101 mg/dL — ABNORMAL HIGH (ref 70–99)
Potassium: 3.8 mmol/L (ref 3.5–5.1)
Sodium: 139 mmol/L (ref 135–145)

## 2022-05-18 LAB — URINE CULTURE: Culture: <10000 — AB

## 2022-05-18 MED ORDER — IBUPROFEN 400 MG PO TABS: 400.0000 mg | ORAL_TABLET | Freq: Four times a day (QID) | ORAL | 0 refills | Status: AC | PRN

## 2022-05-18 MED ORDER — POLYETHYLENE GLYCOL 3350 17 G PO PACK: 17.0000 g | PACK | Freq: Two times a day (BID) | ORAL | 0 refills | Status: AC

## 2022-05-18 MED ORDER — IBUPROFEN 200 MG PO TABS: 400.0000 mg | ORAL_TABLET | Freq: Four times a day (QID) | ORAL | Status: DC | PRN

## 2022-05-18 NOTE — Plan of Care (Signed)

## 2022-05-18 NOTE — Discharge Instructions (Addendum)
Dear Brandi Mendez,   Thank you so much for allowing [place] to be part of your care!  You were admitted to [facility] for partial small bowel obstruction. We let your bowels rest to recover, then reintroduced food as tolerated.  - Continue to take Tylenol and ibuprofen as needed for pain - Take Miralax twice a day until you are able to have regular bowel movements. If your stool becomes too soft, you can decrease Miralax to taking it just once a day.   POST-HOSPITAL & CARE INSTRUCTIONS Needs GI follow up, they will call you to schedule this  Please see your PCP in 1 week as well  Please let PCP/Specialists know of any changes that were made.  Please see medications section of this packet for any medication changes.   DOCTOR'S APPOINTMENT & FOLLOW UP CARE INSTRUCTIONS  No future appointments.  RETURN PRECAUTIONS:  Inability to have a bowel movement over next few days, swelling of the abdomen, confusion, inability to pass gas, severe abdominal pain.   Take care and be well!  Family Medicine Teaching Service  [place]  [facility]  [address] [place], [place] [address] [phone]

## 2022-05-18 NOTE — Progress Notes (Signed)
Discharge instructions (including medications) discussed with and copy provided to patient/caregiver 

## 2022-05-18 NOTE — Discharge Summary (Signed)
Family Medicine Teaching [facility] Discharge Summary  Patient name: Brandi Mendez Medical record number: [id number] Date of birth: 23-Sep-1955 Age: 67 y.o. Gender: female Date of Admission: 05/16/2022  Date of Discharge: 5/14 Admitting Physician: Lance Muss, MD  Primary Care Provider: Bess Kinds, MD Consultants: None  Indication for Hospitalization: Partial SBO requiring IV hydration and pain meds  Brief Hospital Course:  Brandi Mendez is a 67 y.o.female with no relevant PMHx who was admitted to the [facility] Teaching Service at [facility] for abdominal pain. Her hospital course is detailed below:  Abdominal Pain  Concern for partial SBO Presented to ED with 1 week history of abm pain, N/V, and constipation. Obtained Abdominal CT in ED that showed concern for partial SBO. Has had one in remote past (2 years ago). Pt got IVF. Pain was controlled with tylenol, Dilaudid, and Toradol. Constipation was treated with glycerin suppository. She was still passing flatus and was able to slowly advance diet as tolerated. Did not need NGT nor surgical intervention. On discharge, tolerated regular diet and had appropriate BM.  Patient was discharged with Tylenol and ibuprofen as needed for pain. Patient was instructed to continue MiraLAX at discharge. Recommend outpt colonoscopy for further workup.   Other chronic conditions were medically managed with home medications and formulary alternatives as necessary   PCP Follow-up Recommendations: Follow-up on abdominal pain, ability to tolerate PO, and constipation. Check labs as needed.  Please refer to GI for colonoscopy.  Discharge Diagnoses/Problem List:  Constipation Partial SBO  Disposition: Home  Discharge Condition: Improved  Discharge Exam:  Gen: Alert, pleasant woman ambulating in the halls of unit. NAD.  HEENT: NCAT. MMM. CV: RRR, no murmurs Resp: CTAB. Normal WOB on RA Abm: Mild soreness to palpation in LLQ, LUQ, and RUQ. Soft,  nondistended. Normal BS.  Ext: Moves all ext spontaneously Skin: Warm, well perfused  Significant Procedures: none  Significant Labs and Imaging:  Recent Labs  Lab 05/16/22 2139 05/17/22 0234  WBC 7.2 10.0  HGB 12.7 11.8*  HCT 39.0 36.6  PLT 280 259   Recent Labs  Lab 05/16/22 2139 05/17/22 0234 05/18/22 0337  NA 137 136 139  K 3.5 4.0 3.8  CL 99 102 107  CO2 26 24 23   GLUCOSE 124* 143* 101*  BUN 20 16 8   CREATININE 0.85 0.82 0.72  CALCIUM 9.5 8.8* 8.9  ALKPHOS 65 56  --   AST 27 24  --   ALT 30 26  --   ALBUMIN 4.3 3.7  --     Results/Tests Pending at Time of Discharge: none  Discharge Medications:  Allergies as of 05/18/2022   No Known Allergies      Medication List     STOP taking these medications    triamcinolone ointment 0.5 % Commonly known as: KENALOG       TAKE these medications    acetaminophen 500 MG tablet Commonly known as: TYLENOL Take 500 mg by mouth every 6 (six) hours as needed.   ibuprofen 400 MG tablet Commonly known as: ADVIL Take 1 tablet (400 mg total) by mouth every 6 (six) hours as needed (mild pain, fever >100.4).   polyethylene glycol 17 g packet Commonly known as: MiraLax Take 17 g by mouth 2 (two) times daily.        Discharge Instructions: Please refer to Patient Instructions section of EMR for full details.  Patient was counseled important signs and symptoms that should prompt return to medical care,  changes in medications, dietary instructions, activity restrictions, and follow up appointments.   Follow-Up Appointments:  Follow-up Information     Bess Kinds, MD. Go on 06/01/2022.   Specialty: Family Medicine Why: @0830  Contact information: [address] [place] [address] [phone]                 Lincoln Brigham, MD 05/18/2022, 1:10 PM PGY-1, [facility] Family Medicine

## 2022-05-18 NOTE — Progress Notes (Signed)
Patient had BM. Patient awaiting lunch then daughter coming to get her.

## 2022-05-19 ENCOUNTER — Telehealth: Payer: Self-pay

## 2022-05-19 NOTE — Transitions of Care (Post Inpatient/ED Visit) (Signed)
   05/19/2022  Name: Brandi Mendez MRN: [id number] DOB: 11-07-1955  Today's TOC FU Call Status: Today's TOC FU Call Status:: Unsuccessul Call (1st Attempt) Unsuccessful Call (1st Attempt) Date: 05/19/22  Attempted to reach the patient regarding the most recent Inpatient/ED visit.  Follow Up Plan: Additional outreach attempts will be made to reach the patient to complete the Transitions of Care (Post Inpatient/ED visit) call.   Signature Karena Addison, LPN [facility] Nurse Health Advisor Direct Dial [phone]

## 2022-05-20 NOTE — Transitions of Care (Post Inpatient/ED Visit) (Signed)
   05/20/2022  Name: Brandi Mendez MRN: [id number] DOB: 1955/10/12  Today's TOC FU Call Status: Today's TOC FU Call Status:: Unsuccessful Call (2nd Attempt) Unsuccessful Call (1st Attempt) Date: 05/19/22 Unsuccessful Call (2nd Attempt) Date: 05/20/22  Attempted to reach the patient regarding the most recent Inpatient/ED visit.  Follow Up Plan: Additional outreach attempts will be made to reach the patient to complete the Transitions of Care (Post Inpatient/ED visit) call.   Signature Karena Addison, LPN [facility] Nurse Health Advisor Direct Dial [phone]

## 2022-05-21 NOTE — Transitions of Care (Post Inpatient/ED Visit) (Signed)
   05/21/2022  Name: Brandi Mendez MRN: [id number] DOB: 1955/08/11  Today's TOC FU Call Status: Today's TOC FU Call Status:: Unsuccessful Call (3rd Attempt) Unsuccessful Call (1st Attempt) Date: 05/19/22 Unsuccessful Call (2nd Attempt) Date: 05/20/22 Unsuccessful Call (3rd Attempt) Date: 05/21/22  Attempted to reach the patient regarding the most recent Inpatient/ED visit.  Follow Up Plan: No further outreach attempts will be made at this time. We have been unable to contact the patient.  Signature Karena Addison, LPN [facility] Nurse Health Advisor Direct Dial [phone]

## 2022-06-01 ENCOUNTER — Inpatient Hospital Stay: Payer: Self-pay | Admitting: Student

## 2022-06-01 NOTE — Progress Notes (Deleted)
  SUBJECTIVE:   CHIEF COMPLAINT / HPI:   Hospital F/u -Admitted 05/16/22-05/18/22 for partial SBO, tx w/ bowel rest and pain meds -F/u abd pain/constipation/PO tolerance/refer for colonoscopy  PERTINENT  PMH / PSH: ***  Past Medical History:  Diagnosis Date   Small bowel obstruction (HCC) 2013, 2015    Patient Care Team: Bess Kinds, MD as PCP - General (Family Medicine) Nahser, Deloris Ping, MD as PCP - Cardiology (Cardiology) Jena Gauss Gerrit Friends, MD as Consulting Physician (Gastroenterology) OBJECTIVE:  There were no vitals taken for this visit. Physical Exam   ASSESSMENT/PLAN:  There are no diagnoses linked to this encounter. No follow-ups on file. Bess Kinds, MD 06/01/2022, 7:24 AM PGY-***, [facility] Family Medicine {    This will disappear when note is signed, click to select method of visit    :1}

## 2022-06-09 ENCOUNTER — Ambulatory Visit: Payer: Medicare Other

## 2022-06-11 ENCOUNTER — Encounter: Payer: Self-pay | Admitting: Family Medicine

## 2022-06-24 ENCOUNTER — Encounter: Payer: Self-pay | Admitting: Gastroenterology

## 2022-07-07 ENCOUNTER — Ambulatory Visit
Admission: RE | Admit: 2022-07-07 | Discharge: 2022-07-07 | Disposition: A | Discharge status: Home / Self Care | Payer: Medicare Other | Source: Ambulatory Visit | Attending: Internal Medicine | Admitting: Internal Medicine

## 2022-07-07 DIAGNOSIS — Z1231 Encounter for screening mammogram for malignant neoplasm of breast: Secondary | ICD-10-CM

## 2022-08-18 ENCOUNTER — Encounter: Payer: Medicare Other | Admitting: Internal Medicine

## 2022-08-30 ENCOUNTER — Ambulatory Visit: Payer: 59 | Admitting: Gastroenterology

## 2022-08-31 ENCOUNTER — Other Ambulatory Visit: Payer: Self-pay

## 2022-08-31 ENCOUNTER — Other Ambulatory Visit: Payer: Self-pay | Admitting: Internal Medicine

## 2022-08-31 DIAGNOSIS — J302 Other seasonal allergic rhinitis: Secondary | ICD-10-CM

## 2022-08-31 DIAGNOSIS — I10 Essential (primary) hypertension: Secondary | ICD-10-CM

## 2022-08-31 MED ORDER — LOSARTAN POTASSIUM-HCTZ 100-25 MG PO TABS: 1.0000 | ORAL_TABLET | Freq: Every day | ORAL | 1 refills | Status: DC

## 2022-09-07 NOTE — Progress Notes (Signed)
[facility]: Clinic Note  Subjective:  History of Present Illness: Janice Massey is a 67 y.o. year old female who presents for routine follow up of her chronic medical conditions. I saw her in January 2024. She needs ASL interpreter.  She has no new concerns today. Is feeling well.   Big update is that A1C is up to 10.4.   Please refer to Assessment and Plan below for full details in Problem-Based Charting.   Past Medical History:  Patient Active Problem List   Diagnosis Date Noted   Cervical stenosis (uterine cervix) 02/29/2020   Healthcare maintenance 01/30/2020   Greater trochanteric pain syndrome 04/02/2015   VSD (ventricular septal defect), perimembranous 05/29/2013   Essential hypertension 08/10/2011   Hyperlipidemia 10/11/2007   Asthma 11/10/2005   GERD 11/10/2005   Deaf 11/10/2005   Diabetes (HCC) 04/05/2002      Medications:  Current Outpatient Medications:    Semaglutide 7 MG TABS, Take 1 tablet (7 mg total) by mouth daily., Disp: 90 tablet, Rfl: 3   Accu-Chek FastClix Lancets MISC, 1 Units by Does not apply route daily. Use once a daily in morning to check blood glucose, Disp: 102 each, Rfl: 3   aspirin 81 MG chewable tablet, Chew 1 tablet (81 mg total) by mouth daily., Disp: 30 tablet, Rfl: 3   atorvastatin (LIPITOR) 40 MG tablet, Take 1 tablet (40 mg total) by mouth at bedtime., Disp: 90 tablet, Rfl: 3   Blood Glucose Monitoring Suppl (ACCU-CHEK GUIDE) w/Device KIT, 1 Units by Does not apply route daily., Disp: 1 kit, Rfl: 0   cetirizine (ZYRTEC) 10 MG tablet, TAKE 1 TABLET(10 MG) BY MOUTH DAILY AS NEEDED FOR ALLERGIES, Disp: 90 tablet, Rfl: 1   dorzolamide-timolol (COSOPT) 2-0.5 % ophthalmic solution, SMARTSIG:In Eye(s), Disp: , Rfl:    Empagliflozin-metFORMIN HCl (SYNJARDY) 12.05-998 MG TABS, Take 1 tablet by mouth 2 (two) times daily., Disp: 180 tablet, Rfl: 3   glucose blood (ACCU-CHEK GUIDE) test strip, Use as instructed, Disp: 100  each, Rfl: 12   latanoprost (XALATAN) 0.005 % ophthalmic solution, INSTILL 1 DROP IN BOTH EYES EVERY DAY IN THE EVENING (Patient not taking: Reported on 05/01/2021), Disp: 2.5 mL, Rfl: 1   losartan-hydrochlorothiazide (HYZAAR) 100-25 MG tablet, Take 1 tablet by mouth daily., Disp: 90 tablet, Rfl: 1   omeprazole (PRILOSEC) 40 MG capsule, TAKE 1 CAPSULE(40 MG) BY MOUTH DAILY, Disp: 90 capsule, Rfl: 0   polyethylene glycol (MIRALAX) 17 g packet, Take 17 g by mouth 2 (two) times daily., Disp: 14 each, Rfl: 0   senna-docusate (SENOKOT-S) 8.6-50 MG tablet, Take 2 tablets by mouth 2 (two) times daily., Disp: 30 tablet, Rfl: 0   Allergies: Allergies  Allergen Reactions   Codeine Itching   Lisinopril Cough   Penicillins Itching    Has patient had a PCN reaction causing immediate rash, facial/tongue/throat swelling, SOB or lightheadedness with hypotension: YES Has patient had a PCN reaction causing severe rash involving mucus membranes or skin necrosis: NO Has patient had a PCN reaction that required hospitalization NO Has patient had a PCN reaction occurring within the last 10 years: NO If all of the above answers are "NO", then may proceed with Cephalosporin use.       Objective:   Vitals: Vitals:   09/08/22 0951  BP: 119/75  Pulse: 85  Temp: 98.2 F (36.8 C)  SpO2: 98%     Physical Exam: Physical Exam Constitutional:      Appearance: Normal appearance.  Cardiovascular:     Rate and Rhythm: Normal rate and regular rhythm.  Pulmonary:     Effort: Pulmonary effort is normal.     Breath sounds: Normal breath sounds.  Neurological:     Mental Status: She is alert.  Psychiatric:        Mood and Affect: Mood normal.        Behavior: Behavior normal.      Data: Labs, imaging, and micro were reviewed in Epic. Refer to Assessment and Plan below for full details in Problem-Based Charting.  Assessment & Plan:  Diabetes (HCC) - Continue Synjardy 12.5mg /1000mg  BID - Continue  Semaglutide 7mg  PO daily. This has fallen off patient's medicine list, but she says she is taking it. I spelled it for her today and she confirmed this is the medicine she's taking. Will plan to increase to 14mg  PO daily at next visit if she's tolerating the 7mg  well - I recommended starting once daily insulin. Patient is hesitant to start insulin, so we will do CGM x 2 weeks, working on lifestyle changes, then follow up with Lupita Leash. At that time, can weigh risk/benefit of insulin with patient - Eye exam is up to date - Foot exam normal today  - continue statin  Hyperlipidemia - chronic and stable - continue atorvastatin 40mg  daily   Essential hypertension - chronic and stable - continue Losartan 100/hydrochlorothiazide 25mg  daily combo pill   Healthcare maintenance - MMG is up to date - DEXA scan has been ordered and patient provided with information to schedule       Patient will follow up in 4 weeks with me. She'll follow with Lupita Leash ASAP for CGM placement, then see both of [place] to review, and I anticipate she may need insulin soon.   Mercie Eon, MD

## 2022-09-08 ENCOUNTER — Ambulatory Visit (INDEPENDENT_AMBULATORY_CARE_PROVIDER_SITE_OTHER): Payer: Medicare Other | Admitting: Internal Medicine

## 2022-09-08 ENCOUNTER — Encounter: Payer: Self-pay | Admitting: Internal Medicine

## 2022-09-08 VITALS — BP 119/75 | HR 85 | Temp 98.2°F | Ht 67.0 in | Wt 160.5 lb

## 2022-09-08 DIAGNOSIS — E119 Type 2 diabetes mellitus without complications: Secondary | ICD-10-CM | POA: Diagnosis not present

## 2022-09-08 DIAGNOSIS — Z0000 Encounter for general adult medical examination without abnormal findings: Secondary | ICD-10-CM

## 2022-09-08 DIAGNOSIS — I10 Essential (primary) hypertension: Secondary | ICD-10-CM

## 2022-09-08 DIAGNOSIS — E785 Hyperlipidemia, unspecified: Secondary | ICD-10-CM

## 2022-09-08 DIAGNOSIS — Z23 Encounter for immunization: Secondary | ICD-10-CM

## 2022-09-08 DIAGNOSIS — Z7985 Long-term (current) use of injectable non-insulin antidiabetic drugs: Secondary | ICD-10-CM

## 2022-09-08 DIAGNOSIS — R809 Proteinuria, unspecified: Secondary | ICD-10-CM

## 2022-09-08 LAB — POCT GLYCOSYLATED HEMOGLOBIN (HGB A1C): Hemoglobin A1C: 10.4 % — AB (ref 4.0–5.6)

## 2022-09-08 LAB — GLUCOSE, CAPILLARY: Glucose-Capillary: 231 mg/dL — ABNORMAL HIGH (ref 70–99)

## 2022-09-08 MED ORDER — SEMAGLUTIDE 7 MG PO TABS: 7.0000 mg | ORAL_TABLET | Freq: Every day | ORAL | 3 refills | Status: DC

## 2022-09-08 NOTE — Patient Instructions (Addendum)
Dear Ms Stahmer,  It was a pleasure seeing you in clinic today.  Your foot exam was normal. Your blood pressure looks great.  Unfortunately, your A1C is up to 10.4 - we want this number to be less than 7. This means your diabetes is not well controlled. I'd like to place a glucose monitor on you, then have you follow up with Lupita Leash, our diabetes specialist, in 2 weeks. In the meantime, please try to avoid sugary foods and drinks. I think it is likely that I will recommend once daily insulin for you in the near future.   I'll see you back in about 4 weeks.  Sincerely, Dr. Mercie Eon

## 2022-09-08 NOTE — Assessment & Plan Note (Signed)
-   chronic and stable - continue Losartan 100/hydrochlorothiazide 25mg  daily combo pill

## 2022-09-08 NOTE — Assessment & Plan Note (Addendum)
-   Continue Synjardy 12.5mg /1000mg  BID - Continue Semaglutide 7mg  PO daily. This has fallen off patient's medicine list, but she says she is taking it. I spelled it for her today and she confirmed this is the medicine she's taking. Will plan to increase to 14mg  PO daily at next visit if she's tolerating the 7mg  well - I recommended starting once daily insulin. Patient is hesitant to start insulin, so we will do CGM x 2 weeks, working on lifestyle changes, then follow up with Lupita Leash. At that time, can weigh risk/benefit of insulin with patient - Eye exam is up to date - Foot exam normal today  - continue statin

## 2022-09-08 NOTE — Assessment & Plan Note (Signed)
-   MMG is up to date - DEXA scan has been ordered and patient provided with information to schedule

## 2022-09-08 NOTE — Assessment & Plan Note (Signed)
-   chronic and stable - continue atorvastatin 40mg  daily

## 2022-09-28 ENCOUNTER — Telehealth: Payer: Self-pay | Admitting: Dietician

## 2022-09-28 ENCOUNTER — Telehealth: Payer: Self-pay

## 2022-09-28 NOTE — Telephone Encounter (Signed)
Scheduled her for September 30 at 1:15 PM and left a message about appointment for professional CGM per Dr. Lafonda Mosses.

## 2022-09-28 NOTE — Telephone Encounter (Signed)
Pt returned  called  to Janice Massey with the [facility]  on the line pt then placed both me and the person  on hold we sat on the  phone for 10 mins awaiting her return pt never came back to the line

## 2022-10-04 ENCOUNTER — Encounter: Payer: Self-pay | Admitting: Dietician

## 2022-10-12 NOTE — Progress Notes (Unsigned)
[facility]: Clinic Note  Subjective:  History of Present Illness: Janice Massey is a 67 y.o. year old female who presents for 1 month follow up of her diabetes after A1C was 10.4 last time. She is meeting with me and Lupita Leash today.  She is taking Synjardy 12.5mg /1000mg  BID and Semaglutide 7mg  PO daily. She is hoping to avoid insulin.   Lupita Leash placed her CGM today.   Otherwise, no new concerns.   Please refer to Assessment and Plan below for full details in Problem-Based Charting.   Past Medical History:  Patient Active Problem List   Diagnosis Date Noted   Cervical stenosis (uterine cervix) 02/29/2020   Healthcare maintenance 01/30/2020   Greater trochanteric pain syndrome 04/02/2015   VSD (ventricular septal defect), perimembranous 05/29/2013   Essential hypertension 08/10/2011   Hyperlipidemia 10/11/2007   Asthma 11/10/2005   GERD 11/10/2005   Deaf 11/10/2005   Diabetes (HCC) 04/05/2002      Medications:  Current Outpatient Medications:    Semaglutide (RYBELSUS) 14 MG TABS, Take 1 tablet (14 mg total) by mouth daily., Disp: 90 tablet, Rfl: 3   Accu-Chek FastClix Lancets MISC, 1 Units by Does not apply route daily. Use once a daily in morning to check blood glucose, Disp: 102 each, Rfl: 3   aspirin 81 MG chewable tablet, Chew 1 tablet (81 mg total) by mouth daily., Disp: 30 tablet, Rfl: 3   atorvastatin (LIPITOR) 40 MG tablet, Take 1 tablet (40 mg total) by mouth at bedtime., Disp: 90 tablet, Rfl: 3   Blood Glucose Monitoring Suppl (ACCU-CHEK GUIDE) w/Device KIT, 1 Units by Does not apply route daily., Disp: 1 kit, Rfl: 0   cetirizine (ZYRTEC) 10 MG tablet, TAKE 1 TABLET(10 MG) BY MOUTH DAILY AS NEEDED FOR ALLERGIES, Disp: 90 tablet, Rfl: 1   dorzolamide-timolol (COSOPT) 2-0.5 % ophthalmic solution, SMARTSIG:In Eye(s), Disp: , Rfl:    Empagliflozin-metFORMIN HCl (SYNJARDY) 12.05-998 MG TABS, Take 1 tablet by mouth 2 (two) times daily., Disp: 180 tablet,  Rfl: 3   glucose blood (ACCU-CHEK GUIDE) test strip, Use as instructed, Disp: 100 each, Rfl: 12   latanoprost (XALATAN) 0.005 % ophthalmic solution, INSTILL 1 DROP IN BOTH EYES EVERY DAY IN THE EVENING (Patient not taking: Reported on 05/01/2021), Disp: 2.5 mL, Rfl: 1   losartan-hydrochlorothiazide (HYZAAR) 100-25 MG tablet, Take 1 tablet by mouth daily., Disp: 90 tablet, Rfl: 1   omeprazole (PRILOSEC) 40 MG capsule, Take 1 capsule (40 mg total) by mouth daily., Disp: 90 capsule, Rfl: 3   polyethylene glycol (MIRALAX) 17 g packet, Take 17 g by mouth 2 (two) times daily., Disp: 14 each, Rfl: 0   senna-docusate (SENOKOT-S) 8.6-50 MG tablet, Take 2 tablets by mouth 2 (two) times daily., Disp: 30 tablet, Rfl: 0   Allergies: Allergies  Allergen Reactions   Codeine Itching   Lisinopril Cough   Penicillins Itching    Has patient had a PCN reaction causing immediate rash, facial/tongue/throat swelling, SOB or lightheadedness with hypotension: YES Has patient had a PCN reaction causing severe rash involving mucus membranes or skin necrosis: NO Has patient had a PCN reaction that required hospitalization NO Has patient had a PCN reaction occurring within the last 10 years: NO If all of the above answers are "NO", then may proceed with Cephalosporin use.       Objective:   Vitals: Vitals:   10/13/22 1000  BP: 131/84  Pulse: 79  Temp: 98 F (36.7 C)  SpO2: 100%  Physical Exam: Physical Exam Constitutional:      Appearance: Normal appearance.  Neurological:     Mental Status: She is alert.  Psychiatric:        Mood and Affect: Mood normal.        Behavior: Behavior normal.      Data: Labs, imaging, and micro were reviewed in Epic. Refer to Assessment and Plan below for full details in Problem-Based Charting.  Assessment & Plan:  Diabetes (HCC) - CGM placed today - 1 week follow up with Lupita Leash + resident for CGM reading. With her A1C of 10.4, I suspect that she may need to  start once daily long acting insulin - Increase Semaglutide to 14mg  PO daily today - Continue Synjardy 12.5mg /1000mg  BID  - She is up to date on eye & foot exams   GERD - chronic and stable - refilled omeprazole  Hyperlipidemia - chronic and stable - refilled atorvastatin   Healthcare maintenance - she will get her COVID booster at a local pharmacy       Patient will follow up in 1 week for CGM. At that visit, please review CGM data with Lupita Leash, and consider if once daily long-acting insulin may be needed.  Mercie Eon, MD

## 2022-10-13 ENCOUNTER — Encounter: Payer: Self-pay | Admitting: Dietician

## 2022-10-13 ENCOUNTER — Ambulatory Visit (INDEPENDENT_AMBULATORY_CARE_PROVIDER_SITE_OTHER): Payer: Medicare HMO | Admitting: Internal Medicine

## 2022-10-13 ENCOUNTER — Encounter: Payer: Self-pay | Admitting: Internal Medicine

## 2022-10-13 ENCOUNTER — Ambulatory Visit: Payer: Medicare Other | Admitting: Dietician

## 2022-10-13 VITALS — BP 131/84 | HR 79 | Temp 98.0°F | Ht 67.0 in | Wt 155.6 lb

## 2022-10-13 DIAGNOSIS — Z7985 Long-term (current) use of injectable non-insulin antidiabetic drugs: Secondary | ICD-10-CM | POA: Diagnosis not present

## 2022-10-13 DIAGNOSIS — Z794 Long term (current) use of insulin: Secondary | ICD-10-CM

## 2022-10-13 DIAGNOSIS — K219 Gastro-esophageal reflux disease without esophagitis: Secondary | ICD-10-CM | POA: Diagnosis not present

## 2022-10-13 DIAGNOSIS — E119 Type 2 diabetes mellitus without complications: Secondary | ICD-10-CM

## 2022-10-13 DIAGNOSIS — Z0000 Encounter for general adult medical examination without abnormal findings: Secondary | ICD-10-CM

## 2022-10-13 DIAGNOSIS — E785 Hyperlipidemia, unspecified: Secondary | ICD-10-CM | POA: Diagnosis not present

## 2022-10-13 DIAGNOSIS — E1129 Type 2 diabetes mellitus with other diabetic kidney complication: Secondary | ICD-10-CM

## 2022-10-13 MED ORDER — OMEPRAZOLE 40 MG PO CPDR: 40.0000 mg | DELAYED_RELEASE_CAPSULE | Freq: Every day | ORAL | 3 refills | Status: DC

## 2022-10-13 MED ORDER — RYBELSUS 14 MG PO TABS: 14.0000 mg | ORAL_TABLET | Freq: Every day | ORAL | 3 refills | Status: DC

## 2022-10-13 MED ORDER — ATORVASTATIN CALCIUM 40 MG PO TABS: 40.0000 mg | ORAL_TABLET | Freq: Every day | ORAL | 3 refills | Status: DC

## 2022-10-13 NOTE — Assessment & Plan Note (Signed)
-   CGM placed today - 1 week follow up with Lupita Leash + resident for CGM reading. With her A1C of 10.4, I suspect that she may need to start once daily long acting insulin - Increase Semaglutide to 14mg  PO daily today - Continue Synjardy 12.5mg /1000mg  BID  - She is up to date on eye & foot exams

## 2022-10-13 NOTE — Progress Notes (Signed)
Documentation for Freestyle Libre Pro Continuous glucose monitoring Freestyle Libre Pro CGM sensor placed today. Patient was educated about wearing sensor, keeping food, activity and medication log and when to call office. Patient was educated about how to care for the sensor and not to have an MRI, CT or Diathermy while wearing the sensor. Follow up was arranged with the patient for 1 vs 2 weeks per Dr. Lafonda Mosses and 3 weeks with Lupita Leash for a final download.   Lot #: [id number] Serial #: [id number] Expiration Date: 05/04/2023  Norm Parcel, RD 10/13/2022 9:27 AM.  Lab Results  Component Value Date   HGBA1C 10.4 (A) 09/08/2022   HGBA1C 8.2 (A) 04/21/2022   HGBA1C 8.2 (A) 01/20/2022   HGBA1C 8.2 (A) 10/12/2021   HGBA1C 9.1 (A) 07/02/2021  She reports having diabetes for 20 years. She brought her medicines today and states she is taking them as directed. She states she does not check her blood sugar at home. She rates her health as "good" and state nothing bothers her about her diabetes.

## 2022-10-13 NOTE — Assessment & Plan Note (Signed)
-   she will get her COVID booster at a local pharmacy

## 2022-10-13 NOTE — Patient Instructions (Addendum)
Please record the time, amount and what food drinks and activities you have while wearing the continuous glucose monitor (CGM).  Bring the folder with you to follow up appointments. If your monitor falls off, please place it in the bag provided in your folder and bring it back with you to your next appointment.   Do not have a CT or an MRI while wearing the CGM.   1 week visit has been set up with a doctor.   Please remove the sensor on 10-27-22 and put it in the plastic bag provided.  Bring the sensor with you to your appointment with Lupita Leash on November 03, 2022 at 10:15 AM for a final download.Lupita Leash [phone]

## 2022-10-13 NOTE — Assessment & Plan Note (Signed)
-   chronic and stable - refilled omeprazole

## 2022-10-13 NOTE — Assessment & Plan Note (Signed)
-   chronic and stable - refilled atorvastatin

## 2022-10-14 LAB — MICROALBUMIN / CREATININE URINE RATIO
Creatinine, Urine: 59.1 mg/dL
Microalb/Creat Ratio: 41 mg/g{creat} — ABNORMAL HIGH (ref 0–29)
Microalbumin, Urine: 24 ug/mL

## 2022-10-15 ENCOUNTER — Encounter: Payer: Self-pay | Admitting: Internal Medicine

## 2022-10-19 NOTE — Progress Notes (Unsigned)
[facility]: Clinic Note  Subjective:  History of Present Illness: Janice Massey is a 67 y.o. year old female who presents for 1-week follow up of T2DM with CGM. A1C 10.4.   [ ]  download CGM!!  Diabetes (HCC) - CGM placed today - 1 week follow up with Lupita Leash + resident for CGM reading. With her A1C of 10.4, I suspect that she may need to start once daily long acting insulin - Increase Semaglutide to 14mg  PO daily today - Continue Synjardy 12.5mg /1000mg  BID  - She is up to date on eye & foot exams    GERD - chronic and stable - refilled omeprazole   Hyperlipidemia - chronic and stable - refilled atorvastatin    Healthcare maintenance - she will get her COVID booster at a local pharmacy   Please refer to Assessment and Plan below for full details in Problem-Based Charting.   Past Medical History:  Patient Active Problem List   Diagnosis Date Noted   Cervical stenosis (uterine cervix) 02/29/2020   Healthcare maintenance 01/30/2020   Greater trochanteric pain syndrome 04/02/2015   VSD (ventricular septal defect), perimembranous 05/29/2013   Essential hypertension 08/10/2011   Hyperlipidemia 10/11/2007   Asthma 11/10/2005   GERD 11/10/2005   Deaf 11/10/2005   Diabetes (HCC) 04/05/2002      Medications:  Current Outpatient Medications:    Accu-Chek FastClix Lancets MISC, 1 Units by Does not apply route daily. Use once a daily in morning to check blood glucose, Disp: 102 each, Rfl: 3   aspirin 81 MG chewable tablet, Chew 1 tablet (81 mg total) by mouth daily., Disp: 30 tablet, Rfl: 3   atorvastatin (LIPITOR) 40 MG tablet, Take 1 tablet (40 mg total) by mouth at bedtime., Disp: 90 tablet, Rfl: 3   Blood Glucose Monitoring Suppl (ACCU-CHEK GUIDE) w/Device KIT, 1 Units by Does not apply route daily., Disp: 1 kit, Rfl: 0   cetirizine (ZYRTEC) 10 MG tablet, TAKE 1 TABLET(10 MG) BY MOUTH DAILY AS NEEDED FOR ALLERGIES, Disp: 90 tablet, Rfl: 1    dorzolamide-timolol (COSOPT) 2-0.5 % ophthalmic solution, SMARTSIG:In Eye(s), Disp: , Rfl:    Empagliflozin-metFORMIN HCl (SYNJARDY) 12.05-998 MG TABS, Take 1 tablet by mouth 2 (two) times daily., Disp: 180 tablet, Rfl: 3   glucose blood (ACCU-CHEK GUIDE) test strip, Use as instructed, Disp: 100 each, Rfl: 12   latanoprost (XALATAN) 0.005 % ophthalmic solution, INSTILL 1 DROP IN BOTH EYES EVERY DAY IN THE EVENING (Patient not taking: Reported on 05/01/2021), Disp: 2.5 mL, Rfl: 1   losartan-hydrochlorothiazide (HYZAAR) 100-25 MG tablet, Take 1 tablet by mouth daily., Disp: 90 tablet, Rfl: 1   omeprazole (PRILOSEC) 40 MG capsule, Take 1 capsule (40 mg total) by mouth daily., Disp: 90 capsule, Rfl: 3   polyethylene glycol (MIRALAX) 17 g packet, Take 17 g by mouth 2 (two) times daily., Disp: 14 each, Rfl: 0   Semaglutide (RYBELSUS) 14 MG TABS, Take 1 tablet (14 mg total) by mouth daily., Disp: 90 tablet, Rfl: 3   senna-docusate (SENOKOT-S) 8.6-50 MG tablet, Take 2 tablets by mouth 2 (two) times daily., Disp: 30 tablet, Rfl: 0   Allergies: Allergies  Allergen Reactions   Codeine Itching   Lisinopril Cough   Penicillins Itching    Has patient had a PCN reaction causing immediate rash, facial/tongue/throat swelling, SOB or lightheadedness with hypotension: YES Has patient had a PCN reaction causing severe rash involving mucus membranes or skin necrosis: NO Has patient had a PCN reaction  that required hospitalization NO Has patient had a PCN reaction occurring within the last 10 years: NO If all of the above answers are "NO", then may proceed with Cephalosporin use.       Objective:   Vitals: There were no vitals filed for this visit.   Physical Exam: Physical Exam   Data: Labs, imaging, and micro were reviewed in Epic. Refer to Assessment and Plan below for full details in Problem-Based Charting.  Assessment & Plan:  No problem-specific Assessment & Plan notes found for this encounter.      Patient will follow up in ***  Mercie Eon, MD

## 2022-10-20 ENCOUNTER — Encounter: Payer: Self-pay | Admitting: Internal Medicine

## 2022-10-20 ENCOUNTER — Ambulatory Visit: Payer: Medicare HMO | Admitting: Internal Medicine

## 2022-10-20 VITALS — BP 109/66 | HR 93 | Temp 97.7°F | Ht 67.0 in | Wt 157.9 lb

## 2022-10-20 DIAGNOSIS — E119 Type 2 diabetes mellitus without complications: Secondary | ICD-10-CM

## 2022-10-20 DIAGNOSIS — Z7984 Long term (current) use of oral hypoglycemic drugs: Secondary | ICD-10-CM | POA: Diagnosis not present

## 2022-10-20 DIAGNOSIS — I10 Essential (primary) hypertension: Secondary | ICD-10-CM

## 2022-10-20 DIAGNOSIS — E1129 Type 2 diabetes mellitus with other diabetic kidney complication: Secondary | ICD-10-CM

## 2022-10-20 DIAGNOSIS — Z7985 Long-term (current) use of injectable non-insulin antidiabetic drugs: Secondary | ICD-10-CM | POA: Diagnosis not present

## 2022-10-20 NOTE — Assessment & Plan Note (Signed)
-  chronic and stable - continue Losartan 100/hydrochlorothiazide 25mg  daily combo pill

## 2022-10-20 NOTE — Addendum Note (Signed)
Addended by: Derrek Monaco on: 10/20/2022 10:49 AM   Modules accepted: Level of Service

## 2022-10-20 NOTE — Patient Instructions (Signed)
Dear Janice Massey,  It was a pleasure seeing you in clinic today.  Your blood sugars are looking much better!  Keep taking your medicines as you are, including the Rybelsus 14mg  daily.  Your sugars sometimes go high after breakfast. Cereal has a lot of sugar in it, so try to mix up your breakfasts, and cook things like eggs instead of just cereal.  Keep wearing your CGM on your arm, and you'll follow up with Lupita Leash as soon as you get back for your final reading.   Dr. Mercie Eon

## 2022-10-20 NOTE — Assessment & Plan Note (Signed)
-   Carl Best wore the CGM for 8 days. The average reading was 126, % time in target was 87, % time below target was 3, and % time above target was. 9. Intervention will be to keep current management, with focus on eating less sugary cereal breakfasts. The patient will be scheduled to see Lupita Leash for a final appointment.    - Diet: Often high after breakfast and sometimes high after dinner. Reviewed diet log and she eats cereal for breakfast every morning. We discussed substituting cereal for eggs, or doing half cereal and 1 egg. She sometimes eats pasta for dinner, we discussed cutting back on this.  - Meds: She just picked up her increased dose of Rybelsus 14mg  daily yesterday, so I won't change any medicines yet. Continue Synjardy 12.5mg /1000mg  BID & Semaglutide 14mg  daily - She will be out of town for her scheduled appointment with Lupita Leash, so she will reschedule.

## 2022-11-03 ENCOUNTER — Encounter: Payer: Self-pay | Admitting: Dietician

## 2022-11-10 ENCOUNTER — Ambulatory Visit: Payer: Medicare HMO | Admitting: Dietician

## 2022-11-10 ENCOUNTER — Encounter: Payer: Self-pay | Admitting: Dietician

## 2022-11-10 VITALS — Wt 161.8 lb

## 2022-11-10 DIAGNOSIS — R809 Proteinuria, unspecified: Secondary | ICD-10-CM

## 2022-11-10 DIAGNOSIS — E1129 Type 2 diabetes mellitus with other diabetic kidney complication: Secondary | ICD-10-CM

## 2022-11-10 LAB — GLUCOSE, CAPILLARY: Glucose-Capillary: 103 mg/dL — ABNORMAL HIGH (ref 70–99)

## 2022-11-10 NOTE — Patient Instructions (Addendum)
Good job using the Continuous glucose monitor to see how your blood sugars are doing.  Keep eating less cold cereal for breakfast, can eat eggs, greek yogurt, oatmeal or grits!!! As we saw today.   Keep up the protein intake try to have some protein foods each meal.  The semaglutide decreases our appetite and can get in the way of eating protein.  I suggest we follow up in 6-12 months.   Lupita Leash [phone]

## 2022-11-10 NOTE — Progress Notes (Signed)
Diabetes Self-Management Education  Visit Type: Annual Follow-Up  Appt. Start Time: 0925 Appt. End Time: 955  11/10/2022  [name], identified by name and date of birth, is a 67 y.o. female with a diagnosis of Diabetes:  .   ASSESSMENT  Weight 161 lb 12.8 oz (73.4 kg). Body mass index is 25.34 kg/m. Wt Readings from Last 10 Encounters:  11/10/22 161 lb 12.8 oz (73.4 kg)  10/20/22 157 lb 14.4 oz (71.6 kg)  10/13/22 155 lb 9.6 oz (70.6 kg)  10/13/22 155 lb 9.6 oz (70.6 kg)  09/08/22 160 lb 8 oz (72.8 kg)  04/21/22 162 lb 8 oz (73.7 kg)  01/20/22 167 lb 9.6 oz (76 kg)  10/12/21 173 lb (78.5 kg)  08/10/21 176 lb 1.6 oz (79.9 kg)  07/02/21 176 lb 4.8 oz (80 kg)   Lab Results  Component Value Date   HGBA1C 10.4 (A) 09/08/2022   HGBA1C 8.2 (A) 04/21/2022   HGBA1C 8.2 (A) 01/20/2022   HGBA1C 8.2 (A) 10/12/2021   HGBA1C 9.1 (A) 07/02/2021    BP Readings from Last 3 Encounters:  10/20/22 109/66  10/13/22 131/84  09/08/22 119/75   CGM sensor was removed and 14 day download reviewed with patient and a copy put in Dr. Judith Blonder box. Patients glucose spikes increased significantly after she had her first week follow up with Dr. Lafonda Mosses. She states she is eating fruits, vegetables lean protein nuts and seeds and a snack after her meals.  We do not have a food record today for review. Her weight is appropriate and stable. She has an eye exam schedule of 01/29/23.   CGM data:  CGM Results from download:   % Time CGM active:   100 %   (Goal >70%)  Average glucose:   160 mg/dL for 14 days  Glucose management indicator:   7.1 %  Time in range (70-180 mg/dL):   68 %   (Goal >40%)  Time High (181-250 mg/dL):   23 %   (Goal < 98%)  Time Very High (>250 mg/dL):    8 %   (Goal < 5%)  Time Low (54-69 mg/dL):   1 %   (Goal <1%)  Time Very Low (<54 mg/dL):   0 %   (Goal <1%)  Coefficient of variation:   34.9 %   (Goal <36%)     Diabetes Self-Management Education - 11/10/22 0900        Visit Information   Visit Type Annual Follow-Up      Health Coping   How would you rate your overall health? Good      Psychosocial Assessment   Patient Belief/Attitude about Diabetes Motivated to manage diabetes    What is the hardest part about your diabetes right now, causing you the most concern, or is the most worrisome to you about your diabetes?   Other (comment)    Self-care barriers Hard of hearing;Lack of transportation;Lack of material resources    Self-management support Family;Doctor's office;CDE visits    Other persons present Interpreter    Patient Concerns Glycemic Control    Special Needs Other (comment)   use interpreter and written information   Preferred Learning Style Visual    Learning Readiness Ready    How often do you need to have someone help you when you read instructions, pamphlets, or other written materials from your doctor or pharmacy? 2 - Rarely    What is the last grade level you completed in school? 12  Pre-Education Assessment   Patient understands the diabetes disease and treatment process. Comprehends key points    Patient understands incorporating nutritional management into lifestyle. Needs Review    Patient undertands incorporating physical activity into lifestyle. Comprehends key points    Patient understands using medications safely. Comprehends key points    Patient understands monitoring blood glucose, interpreting and using results Needs Review    Patient understands prevention, detection, and treatment of acute complications. Comprehends key points    Patient understands prevention, detection, and treatment of chronic complications. Compreheands key points    Patient understands how to develop strategies to address psychosocial issues. Comprehends key points    Patient understands how to develop strategies to promote health/change behavior. Comprehends key points      Complications   Last HgB A1C per patient/outside source 10.4 %     How often do you check your blood sugar? 0 times/day (not testing)    Fasting Blood glucose range (mg/dL) 16-109;604-540    Postprandial Blood glucose range (mg/dL) 981-191;>478    Number of hypoglycemic episodes per month 0    Number of hyperglycemic episodes ( >200mg /dL): Daily    Can you tell when your blood sugar is high? No    Have you had a dilated eye exam in the past 12 months? No    Have you had a dental exam in the past 12 months? No    Are you checking your feet? Yes    How many days per week are you checking your feet? 7      Dietary Intake   Breakfast cereal small bowl or egg cooked in olive oil and grits or oatmeal    Lunch collard greens, fturkey neck, fish, or chicken    Dinner same as lunch    Snack (evening) 3 zero sugar cookies    Beverage(s) water      Activity / Exercise   Activity / Exercise Type Light (walking / raking leaves);ADL's    How many days per week do you exercise? 3    How many minutes per day do you exercise? 30    Total minutes per week of exercise 90      Patient Education   Previous Diabetes Education Yes (please comment)   here   Healthy Eating Role of diet in the treatment of diabetes and the relationship between the three main macronutrients and blood glucose level;Reviewed blood glucose goals for pre and post meals and how to evaluate the patients' food intake on their blood glucose level.    Monitoring Taught/evaluated CGM (comment);Identified appropriate SMBG and/or A1C goals.      Individualized Goals (developed by patient)   Nutrition Follow meal plan discussed    Monitoring  Consistenly use CGM      Patient Self-Evaluation of Goals - Patient rates self as meeting previously set goals (% of time)   Nutrition >75% (most of the time)    Monitoring >75% (most of the time)      Post-Education Assessment   Patient understands incorporating nutritional management into lifestyle. Comprehends key points    Patient understands monitoring  blood glucose, interpreting and using results Comprehends key points      Outcomes   Expected Outcomes Demonstrated interest in learning. Expect positive outcomes    Future DMSE Yearly   per patient request   Program Status Completed      Subsequent Visit   Since your last visit have you continued or begun to take your medications  as prescribed? Yes   she states that she is taking and toleratmg her new dose of semaglutide.   Since your last visit have you had your blood pressure checked? Yes    Is your most recent blood pressure lower, unchanged, or higher since your last visit? Lower    Since your last visit have you experienced any weight changes? Loss    Weight Loss (lbs) 12   12# in the past yuear, but seems to be appropriate and stable now. do not recommed any more weight loss.  she is eating well.   Since your last visit, are you checking your blood glucose at least once a day? No   she states she has a meter and supplies            Individualized Plan for Diabetes Self-Management Training:   Learning Objective:  Patient will have a greater understanding of diabetes self-management. Patient education plan is to attend individual and/or group sessions per assessed needs and concerns.   Plan:   Patient Instructions  Good job using the Continuous glucose monitor to see how your blood sugars are doing.  Keep eating less cold cereal for breakfast, can eat eggs, greek yogirt, oatmeal or grits!!! As we saw today.   Keep up the protein intake try to have some protein foods each meals.  The semaglutide decreases our appetite and can get in the way of eating protein.  I suggest we follow up in 6-12 months.   Janice Massey [phone]   Expected Outcomes:  Demonstrated interest in learning. Expect positive outcomes  Education material provided: Diabetes Resources  If problems or questions, patient to contact team via:  Phone  Future DSME appointment: Yearly (per patient  request) Norm Parcel, RD 11/10/2022 10:31 AM.

## 2022-11-30 ENCOUNTER — Ambulatory Visit: Payer: 59 | Admitting: Student

## 2022-11-30 NOTE — Progress Notes (Deleted)
    SUBJECTIVE:   Chief compliant/HPI: annual examination  Brandi Mendez is a 67 y.o. who presents today for an annual exam.    History tabs reviewed and updated ***.   Review of systems form reviewed and notable for ***.   OBJECTIVE:   There were no vitals taken for this visit.  ***  ASSESSMENT/PLAN:   No problem-specific Assessment & Plan notes found for this encounter.    Annual Examination  See AVS for age appropriate recommendations  PHQ score ***, reviewed and discussed.  BP reviewed and at goal ***.  Asked about intimate partner violence and resources given as appropriate  Advance directives discussion ***  Considered the following items based upon USPSTF recommendations: Diabetes screening: discussed and ordered Screening for elevated cholesterol: discussed and ordered HIV testing: {discussed/ordered:14545} Neg 05/17/22 Hepatitis C: {discussed/ordered:14545} Neg 09/12/18 Hepatitis B: {discussed/ordered:14545} Syphilis if at high risk: {discussed/ordered:14545} GC/CT {GC/CT screening :23818} Neg 10/07/21 Osteoporosis screening considered based upon risk of fracture from [facility] calculator. Major osteoporotic fracture risk is 8.7%. DEXA not ordered.  Reviewed risk factors for latent tuberculosis and {not indicated/requested/declined:14582}   Discussed family history, BRCA testing {not indicated/requested/declined:14582}. Tool used to risk stratify was ***.  Cervical cancer screening: not indicated given history of hysterectomy with prior normal cytology.  Breast cancer screening:  Up to Date, last negative on 1/24 Colorectal cancer screening: {crcscreen:23821::"discussed, colonoscopy ordered"} Lung cancer screening: {discussed/declined/written VZDG:38756}. See documentation below regarding indications/risks/benefits.  Vaccinations ***.   Follow up in 1 *** year or sooner if indicated.    Bess Kinds, MD [facility]

## 2022-12-20 NOTE — Progress Notes (Unsigned)
[facility]: Clinic Note  Subjective:  History of Present Illness: Janice Massey is a 67 y.o. year old female who presents for 2 month follow up of her T2DM, with a chief concern of abdominal pain. I used an ASL interpreter for this visit.   For her diabetes, her A1C came down from 10.4 to 6.6 on Rybelsus 14 & Synjardy 12.05/998 BID. She was in target range 87% of the time at her last visit with Lupita Leash in November. Great job.  Her main concern today is acute abdominal pain that started a few days ago after attending her cousin's funeral on Saturday. The pain is sharp, rated as a 9/10, and constant. It is located in her RLQ. It is associated with bloating. It feels exactly like the pain she had when she was admitted to the hospital in 04/2021 for constipation. She has been constipated during this time. Yesterday, she had to strain to have a small, hard bowel movement. She has had some nausea, no bileous vomiting. She is passing gas. She had been prescribed Sennakot and Miralax, but has not been taking these. Took Miralax once this week, but didn't find it helpful. She is able to keep food and water down. NO fevers or chills. No urinary symptoms.    Please refer to Assessment and Plan below for full details in Problem-Based Charting.   Past Medical History:  Patient Active Problem List   Diagnosis Date Noted   Right lower quadrant abdominal pain 12/22/2022   Cervical stenosis (uterine cervix) 02/29/2020   Healthcare maintenance 01/30/2020   VSD (ventricular septal defect), perimembranous 05/29/2013   Essential hypertension 08/10/2011   Hyperlipidemia 10/11/2007   Asthma 11/10/2005   GERD 11/10/2005   Deaf 11/10/2005   Type 2 diabetes mellitus (HCC) 04/05/2002      Medications:  Current Outpatient Medications:    bisacodyl (DULCOLAX) 10 MG suppository, Place 1 suppository (10 mg total) rectally as needed for moderate constipation., Disp: 12 suppository, Rfl: 0    Accu-Chek FastClix Lancets MISC, 1 Units by Does not apply route daily. Use once a daily in morning to check blood glucose, Disp: 102 each, Rfl: 3   aspirin 81 MG chewable tablet, Chew 1 tablet (81 mg total) by mouth daily., Disp: 90 tablet, Rfl: 3   atorvastatin (LIPITOR) 40 MG tablet, Take 1 tablet (40 mg total) by mouth at bedtime., Disp: 90 tablet, Rfl: 3   Blood Glucose Monitoring Suppl (ACCU-CHEK GUIDE) w/Device KIT, 1 Units by Does not apply route daily., Disp: 1 kit, Rfl: 0   cetirizine (ZYRTEC) 10 MG tablet, TAKE 1 TABLET(10 MG) BY MOUTH DAILY AS NEEDED FOR ALLERGIES, Disp: 90 tablet, Rfl: 1   dorzolamide-timolol (COSOPT) 2-0.5 % ophthalmic solution, SMARTSIG:In Eye(s), Disp: , Rfl:    Empagliflozin-metFORMIN HCl (SYNJARDY) 12.05-998 MG TABS, Take 1 tablet by mouth 2 (two) times daily., Disp: 180 tablet, Rfl: 3   glucose blood (ACCU-CHEK GUIDE) test strip, Use as instructed, Disp: 100 each, Rfl: 12   ketorolac (ACULAR) 0.5 % ophthalmic solution, SMARTSIG:In Eye(s), Disp: , Rfl:    latanoprost (XALATAN) 0.005 % ophthalmic solution, INSTILL 1 DROP IN BOTH EYES EVERY DAY IN THE EVENING (Patient not taking: Reported on 05/01/2021), Disp: 2.5 mL, Rfl: 1   losartan-hydrochlorothiazide (HYZAAR) 100-25 MG tablet, Take 1 tablet by mouth daily., Disp: 90 tablet, Rfl: 1   omeprazole (PRILOSEC) 40 MG capsule, Take 1 capsule (40 mg total) by mouth daily., Disp: 90 capsule, Rfl: 3   polyethylene  glycol (MIRALAX) 17 g packet, Take 17 g by mouth 2 (two) times daily., Disp: 14 each, Rfl: 0   prednisoLONE acetate (PRED FORTE) 1 % ophthalmic suspension, SMARTSIG:In Eye(s), Disp: , Rfl:    Semaglutide (RYBELSUS) 14 MG TABS, Take 1 tablet (14 mg total) by mouth daily., Disp: 90 tablet, Rfl: 3   senna-docusate (SENOKOT-S) 8.6-50 MG tablet, Take 2 tablets by mouth 2 (two) times daily., Disp: 30 tablet, Rfl: 0   Allergies: Allergies  Allergen Reactions   Codeine Itching   Lisinopril Cough   Penicillins Itching     Has patient had a PCN reaction causing immediate rash, facial/tongue/throat swelling, SOB or lightheadedness with hypotension: YES Has patient had a PCN reaction causing severe rash involving mucus membranes or skin necrosis: NO Has patient had a PCN reaction that required hospitalization NO Has patient had a PCN reaction occurring within the last 10 years: NO If all of the above answers are "NO", then may proceed with Cephalosporin use.       Objective:   Vitals: Vitals:   12/22/22 0953  BP: 137/75  Pulse: 90  Temp: 98 F (36.7 C)     Physical Exam: Physical Exam Constitutional:      General: She is not in acute distress.    Appearance: Normal appearance. She is not ill-appearing.  Abdominal:     General: Abdomen is flat. Bowel sounds are normal. There is no distension.     Palpations: Abdomen is soft. There is no mass.     Comments: +tenderness to palpation in RUQ & RLQ, no rebound or guarding. Negative Murphy's sign.   Neurological:     Mental Status: She is alert.      Data: Labs, imaging, and micro were reviewed in Epic. Refer to Assessment and Plan below for full details in Problem-Based Charting.  Assessment & Plan:  Right lower quadrant abdominal pain - I think this is most likely related to constipation, as it feels exactly like the pain she had when she was admitted for constipation in 04/2021. I am reassured that she is keeping food and water down, that her vitals are normal, and that she has no peritoneal signs on exam. I considered acute appendicitis, but exam not consistent with this. Also considered biliary cause, she has a history of CCY, but again, her exam not consistent with this - I don't think we need to get urgent abdominal imaging - Will check CBC & CMP today - Start back bowel regimen with Miralax BID, Senna/Docusate BID, and Bisacodyl & tap water enemas prn severe constipation - Increase water intake and okay for prune juice - I counseled her  extensively on warning signs for when to seek urgent medical attention  - Also, when she was hospitalized in 04/2021 with constipation, her SGLT2 was thought to be a culprit leading to dehydration, so it was stopped. We had restarted empagliflozen (in synjardy) when A1C jumped up to 10.4 recently. When I call her with labs, will have a risk/benefit discussion about continuing this medicine  Type 2 diabetes mellitus (HCC) - For now, continue Synjardy 12.5mg /1000mg  BID & Semaglutide 14mg  daily  - Discuss SGLT2 use if her symptoms are from constipation - A1C down to 6.6 today!   Essential hypertension - Continue Losartan 100 / hydrochlorothiazide 25mg  daily for now - Could swap hydrochlorothiazide to amlodipine if she remains dehydrated leading to constipation.      Patient will follow up in 3 months and sooner if needed  Raynelle Fanning  Lafonda Mosses, MD

## 2022-12-22 ENCOUNTER — Ambulatory Visit (INDEPENDENT_AMBULATORY_CARE_PROVIDER_SITE_OTHER): Payer: Medicare HMO | Admitting: Internal Medicine

## 2022-12-22 ENCOUNTER — Encounter: Payer: Self-pay | Admitting: Internal Medicine

## 2022-12-22 VITALS — BP 137/75 | HR 90 | Temp 98.0°F | Wt 160.0 lb

## 2022-12-22 DIAGNOSIS — I10 Essential (primary) hypertension: Secondary | ICD-10-CM | POA: Diagnosis not present

## 2022-12-22 DIAGNOSIS — R809 Proteinuria, unspecified: Secondary | ICD-10-CM

## 2022-12-22 DIAGNOSIS — Z7985 Long-term (current) use of injectable non-insulin antidiabetic drugs: Secondary | ICD-10-CM

## 2022-12-22 DIAGNOSIS — E119 Type 2 diabetes mellitus without complications: Secondary | ICD-10-CM

## 2022-12-22 DIAGNOSIS — R1031 Right lower quadrant pain: Secondary | ICD-10-CM | POA: Diagnosis not present

## 2022-12-22 DIAGNOSIS — E1129 Type 2 diabetes mellitus with other diabetic kidney complication: Secondary | ICD-10-CM

## 2022-12-22 LAB — GLUCOSE, CAPILLARY: Glucose-Capillary: 111 mg/dL — ABNORMAL HIGH (ref 70–99)

## 2022-12-22 LAB — POCT GLYCOSYLATED HEMOGLOBIN (HGB A1C): Hemoglobin A1C: 6.6 % — AB (ref 4.0–5.6)

## 2022-12-22 MED ORDER — BISACODYL 10 MG RE SUPP: 10.0000 mg | RECTAL | 0 refills | Status: AC | PRN

## 2022-12-22 MED ORDER — POLYETHYLENE GLYCOL 3350 17 G PO PACK: 17.0000 g | PACK | Freq: Two times a day (BID) | ORAL | 0 refills | Status: AC

## 2022-12-22 MED ORDER — SENNOSIDES-DOCUSATE SODIUM 8.6-50 MG PO TABS: 2.0000 | ORAL_TABLET | Freq: Two times a day (BID) | ORAL | 0 refills | Status: AC

## 2022-12-22 MED ORDER — ASPIRIN 81 MG PO CHEW: 81.0000 mg | CHEWABLE_TABLET | Freq: Every day | ORAL | 3 refills | Status: AC

## 2022-12-22 NOTE — Assessment & Plan Note (Signed)
-   Continue Losartan 100 / hydrochlorothiazide 25mg  daily for now - Could swap hydrochlorothiazide to amlodipine if she remains dehydrated leading to constipation.

## 2022-12-22 NOTE — Assessment & Plan Note (Signed)
-   For now, continue Synjardy 12.5mg /1000mg  BID & Semaglutide 14mg  daily  - Discuss SGLT2 use if her symptoms are from constipation - A1C down to 6.6 today!

## 2022-12-22 NOTE — Patient Instructions (Signed)
Thank you, [name] for allowing [place] to provide your care today. Today we discussed your abdominal pain. I think this is from constipation, like you had last year in April.    Please drink lots of water and you can drink prune juice as well. I want you to pick up stool softeners today and start taking them.  Please take: Miralax - twice daily Sennakot - twice daily  If you still have not had a bowel movement by this afternoon, take a Bisacodyl suppository or do an enema.   Keep taking the Miralax and Sennakot twice daily every day.   If you develop any of these symptoms, please call 911 or go to the Emergency Room: - Severe abdominal pain - Being unable to keep water and food down without vomiting - Being unable to pass gas and unable to have a bowel movement     I have ordered the following labs for you:  Lab Orders         Glucose, capillary         CBC with Diff         CMP14 + Anion Gap         POC Hbg A1C      Tests ordered today:  None  Referrals ordered today:   Referral Orders  No referral(s) requested today     I have ordered the following medication/changed the following medications:    Start the following medications: Meds ordered this encounter  Medications   aspirin 81 MG chewable tablet    Sig: Chew 1 tablet (81 mg total) by mouth daily.    Dispense:  90 tablet    Refill:  3   senna-docusate (SENOKOT-S) 8.6-50 MG tablet    Sig: Take 2 tablets by mouth 2 (two) times daily.    Dispense:  30 tablet    Refill:  0   polyethylene glycol (MIRALAX) 17 g packet    Sig: Take 17 g by mouth 2 (two) times daily.    Dispense:  14 each    Refill:  0   bisacodyl (DULCOLAX) 10 MG suppository    Sig: Place 1 suppository (10 mg total) rectally as needed for moderate constipation.    Dispense:  12 suppository    Refill:  0     Return in about 3 months (around 03/22/2023).     Should you have any questions or concerns please call the internal medicine  clinic at [phone].     Mercie Eon, MD Faculty, Internal Medicine Teaching Progam [facility]

## 2022-12-22 NOTE — Assessment & Plan Note (Signed)
-   I think this is most likely related to constipation, as it feels exactly like the pain she had when she was admitted for constipation in 04/2021. I am reassured that she is keeping food and water down, that her vitals are normal, and that she has no peritoneal signs on exam. I considered acute appendicitis, but exam not consistent with this. Also considered biliary cause, she has a history of CCY, but again, her exam not consistent with this - I don't think we need to get urgent abdominal imaging - Will check CBC & CMP today - Start back bowel regimen with Miralax BID, Senna/Docusate BID, and Bisacodyl & tap water enemas prn severe constipation - Increase water intake and okay for prune juice - I counseled her extensively on warning signs for when to seek urgent medical attention  - Also, when she was hospitalized in 04/2021 with constipation, her SGLT2 was thought to be a culprit leading to dehydration, so it was stopped. We had restarted empagliflozen (in synjardy) when A1C jumped up to 10.4 recently. When I call her with labs, will have a risk/benefit discussion about continuing this medicine

## 2022-12-23 ENCOUNTER — Encounter: Payer: Medicare HMO | Admitting: Dietician

## 2022-12-27 NOTE — Telephone Encounter (Signed)
error 

## 2022-12-31 ENCOUNTER — Encounter: Payer: Self-pay | Admitting: Internal Medicine

## 2022-12-31 LAB — CBC WITH DIFFERENTIAL/PLATELET
Basophils Absolute: 0 10*3/uL (ref 0.0–0.2)
Basos: 1 %
EOS (ABSOLUTE): 0.1 10*3/uL (ref 0.0–0.4)
Eos: 1 %
Hematocrit: 40.8 % (ref 34.0–46.6)
Hemoglobin: 14.1 g/dL (ref 11.1–15.9)
Immature Grans (Abs): 0 10*3/uL (ref 0.0–0.1)
Immature Granulocytes: 0 %
Lymphocytes Absolute: 2.2 10*3/uL (ref 0.7–3.1)
Lymphs: 27 %
MCH: 31 pg (ref 26.6–33.0)
MCHC: 34.6 g/dL (ref 31.5–35.7)
MCV: 90 fL (ref 79–97)
Monocytes Absolute: 0.6 10*3/uL (ref 0.1–0.9)
Monocytes: 8 %
Neutrophils Absolute: 5.2 10*3/uL (ref 1.4–7.0)
Neutrophils: 63 %
Platelets: 305 10*3/uL (ref 150–450)
RBC: 4.55 x10E6/uL (ref 3.77–5.28)
RDW: 12 % (ref 11.7–15.4)
WBC: 8.2 10*3/uL (ref 3.4–10.8)

## 2022-12-31 LAB — CMP14 + ANION GAP
ALT: 15 [IU]/L (ref 0–32)
AST: 24 [IU]/L (ref 0–40)
Albumin: 5.2 g/dL — ABNORMAL HIGH (ref 3.9–4.9)
Alkaline Phosphatase: 101 [IU]/L (ref 44–121)
BUN/Creatinine Ratio: 12 (ref 12–28)
BUN: 14 mg/dL (ref 8–27)
Bilirubin Total: 0.4 mg/dL (ref 0.0–1.2)
CO2: 23 mmol/L (ref 20–29)
Calcium: 10.6 mg/dL — ABNORMAL HIGH (ref 8.7–10.3)
Chloride: 106 mmol/L (ref 96–106)
Creatinine, Ser: 1.15 mg/dL — ABNORMAL HIGH (ref 0.57–1.00)
Globulin, Total: 2.7 g/dL (ref 1.5–4.5)
Glucose: 109 mg/dL — ABNORMAL HIGH (ref 70–99)
Potassium: 4.5 mmol/L (ref 3.5–5.2)
Total Protein: 7.9 g/dL (ref 6.0–8.5)
eGFR: 53 mL/min/{1.73_m2} — ABNORMAL LOW (ref >59)

## 2023-02-08 NOTE — Progress Notes (Deleted)
 CARDIOLOGY CONSULT NOTE       Patient ID: Janice Massey MRN: [id number] DOB/AGE: 02-13-1955 68 y.o.  Admit date: (Not on file) Referring Physician: Lafonda Mosses Primary Physician: Mercie Eon, MD Primary Cardiologist: New Reason for Consultation: VSD  Active Problems:   * No active hospital problems. *   HPI:  68 y.o. seen at request Dr Lafonda Mosses for congenital heart dx. Last seen by me in 2021 Last echo June 2022 with restrictive VSD and normal biventricular function She is deaf and signs. Normal myovue 2012 for atypical chest pain CRF include DM, HTN and HLD  ***  ROS All other systems reviewed and negative except as noted above  Past Medical History:  Diagnosis Date   Back pain    Breast lump on left side at 5 o'clock position 07/31/2019   Cardiac murmur    small membranous VSD with fairly loud cardiac murmur (asymptomatic)    Chronic PID (chronic pelvic inflammatory disease)    Deaf    Since age 9   Diabetes mellitus 2004   Greater trochanteric pain syndrome 04/02/2015   History of Doppler ultrasound 06/20/2009   Normal, no prior studies for comparison.    Hypertension    Mute    Obese    URI (upper respiratory infection)    Ventricular septal defect 2004 per echo   small membranous VSD    Family History  Problem Relation Age of Onset   Cancer Mother        liver   Heart attack Mother 79   Hypertension Father    Diabetes Father    Heart attack Father    CAD Father 51   Diabetes Paternal Aunt    Diabetes Maternal Grandfather    Breast cancer Cousin    Colon cancer Neg Hx    Endometrial cancer Neg Hx    Pancreatic cancer Neg Hx    Prostate cancer Neg Hx    Ovarian cancer Neg Hx     Social History   Socioeconomic History   Marital status: Single    Spouse name: Not on file   Number of children: 1   Years of education: Not on file   Highest education level: Not on file  Occupational History    Employer: UNEMPLOYED  Tobacco Use   Smoking status:  Never   Smokeless tobacco: Never  Vaping Use   Vaping status: Never Used  Substance and Sexual Activity   Alcohol use: No    Alcohol/week: 0.0 standard drinks of alcohol   Drug use: No   Sexual activity: Not Currently    Birth control/protection: Surgical  Other Topics Concern   Not on file  Social History Narrative   Not on file   Social Drivers of Health   Financial Resource Strain: Low Risk  (01/20/2022)   Overall Financial Resource Strain (CARDIA)    Difficulty of Paying Living Expenses: Not very hard  Food Insecurity: No Food Insecurity (01/20/2022)   Hunger Vital Sign    Worried About Running Out of Food in the Last Year: Never true    Ran Out of Food in the Last Year: Never true  Transportation Needs: No Transportation Needs (01/20/2022)   PRAPARE - Administrator, Civil Service (Medical): No    Lack of Transportation (Non-Medical): No  Physical Activity: Insufficiently Active (01/20/2022)   Exercise Vital Sign    Days of Exercise per Week: 1 day    Minutes of Exercise per Session: 30 min  Stress: No Stress Concern Present (01/20/2022)   [employer] of Occupational Health - Occupational Stress Questionnaire    Feeling of Stress : Not at all  Social Connections: Socially Isolated (01/20/2022)   Social Connection and Isolation Panel [NHANES]    Frequency of Communication with Friends and Family: More than three times a week    Frequency of Social Gatherings with Friends and Family: Once a week    Attends Religious Services: Never    Database administrator or Organizations: No    Attends Banker Meetings: Never    Marital Status: Never married  Intimate Partner Violence: Not on file    Past Surgical History:  Procedure Laterality Date   CARDIAC CATHETERIZATION  08/23/95   CHOLECYSTECTOMY  11/04/1993   COLPOSCOPY     ENDOMETRIAL BIOPSY  02/29/2020       TUBAL LIGATION  10/05/1978      Current Outpatient Medications:    Accu-Chek FastClix  Lancets MISC, 1 Units by Does not apply route daily. Use once a daily in morning to check blood glucose, Disp: 102 each, Rfl: 3   aspirin 81 MG chewable tablet, Chew 1 tablet (81 mg total) by mouth daily., Disp: 90 tablet, Rfl: 3   atorvastatin (LIPITOR) 40 MG tablet, Take 1 tablet (40 mg total) by mouth at bedtime., Disp: 90 tablet, Rfl: 3   bisacodyl (DULCOLAX) 10 MG suppository, Place 1 suppository (10 mg total) rectally as needed for moderate constipation., Disp: 12 suppository, Rfl: 0   Blood Glucose Monitoring Suppl (ACCU-CHEK GUIDE) w/Device KIT, 1 Units by Does not apply route daily., Disp: 1 kit, Rfl: 0   cetirizine (ZYRTEC) 10 MG tablet, TAKE 1 TABLET(10 MG) BY MOUTH DAILY AS NEEDED FOR ALLERGIES, Disp: 90 tablet, Rfl: 1   dorzolamide-timolol (COSOPT) 2-0.5 % ophthalmic solution, SMARTSIG:In Eye(s), Disp: , Rfl:    Empagliflozin-metFORMIN HCl (SYNJARDY) 12.05-998 MG TABS, Take 1 tablet by mouth 2 (two) times daily., Disp: 180 tablet, Rfl: 3   glucose blood (ACCU-CHEK GUIDE) test strip, Use as instructed, Disp: 100 each, Rfl: 12   ketorolac (ACULAR) 0.5 % ophthalmic solution, SMARTSIG:In Eye(s), Disp: , Rfl:    latanoprost (XALATAN) 0.005 % ophthalmic solution, INSTILL 1 DROP IN BOTH EYES EVERY DAY IN THE EVENING (Patient not taking: Reported on 05/01/2021), Disp: 2.5 mL, Rfl: 1   losartan-hydrochlorothiazide (HYZAAR) 100-25 MG tablet, Take 1 tablet by mouth daily., Disp: 90 tablet, Rfl: 1   omeprazole (PRILOSEC) 40 MG capsule, Take 1 capsule (40 mg total) by mouth daily., Disp: 90 capsule, Rfl: 3   polyethylene glycol (MIRALAX) 17 g packet, Take 17 g by mouth 2 (two) times daily., Disp: 14 each, Rfl: 0   prednisoLONE acetate (PRED FORTE) 1 % ophthalmic suspension, SMARTSIG:In Eye(s), Disp: , Rfl:    Semaglutide (RYBELSUS) 14 MG TABS, Take 1 tablet (14 mg total) by mouth daily., Disp: 90 tablet, Rfl: 3   senna-docusate (SENOKOT-S) 8.6-50 MG tablet, Take 2 tablets by mouth 2 (two) times  daily., Disp: 30 tablet, Rfl: 0    Physical Exam: There were no vitals taken for this visit.    Affect appropriate Healthy:  appears stated age HEENT: normal Neck supple with no adenopathy JVP normal no bruits no thyromegaly Lungs clear with no wheezing and good diaphragmatic motion Heart:  S1/S2 ***  murmur, no rub, gallop or click PMI normal Abdomen: benighn, BS positve, no tenderness, no AAA no bruit.  No HSM or HJR Distal pulses intact with no bruits  No edema Neuro non-focal Skin warm and dry No muscular weakness   Labs:   Lab Results  Component Value Date   WBC 8.2 12/22/2022   HGB 14.1 12/22/2022   HCT 40.8 12/22/2022   MCV 90 12/22/2022   PLT 305 12/22/2022   No results for input(s): "NA", "K", "CL", "CO2", "BUN", "CREATININE", "CALCIUM", "PROT", "BILITOT", "ALKPHOS", "ALT", "AST", "GLUCOSE" in the last 168 hours.  Invalid input(s): "LABALBU" No results found for: "CKTOTAL", "CKMB", "CKMBINDEX", "TROPONINI"  Lab Results  Component Value Date   CHOL 163 05/02/2021   CHOL 169 07/17/2020   CHOL 140 10/10/2013   Lab Results  Component Value Date   HDL 59 05/02/2021   HDL 64 07/17/2020   HDL 55 10/10/2013   Lab Results  Component Value Date   LDLCALC 87 05/02/2021   LDLCALC 86 07/17/2020   LDLCALC 51 10/10/2013   Lab Results  Component Value Date   TRIG 85 05/02/2021   TRIG 105 07/17/2020   TRIG 172 (H) 10/10/2013   Lab Results  Component Value Date   CHOLHDL 2.8 05/02/2021   CHOLHDL 2.6 07/17/2020   CHOLHDL 2.5 10/10/2013   No results found for: "LDLDIRECT"    Radiology: No results found.  EKG: SR insignificant Q waves inferior lateral Tall R V1 01/06/22   ASSESSMENT AND PLAN:   VSD:  update TTE for RV/LV function *** HTN: continue Hyzaar and DASH diet HLD:  on lipitor labs with primary suggested calcium score DM:  Discussed low carb diet.  Target hemoglobin A1c is 6.5 or less.  Continue current medications.  Calcium Score TTE  F/U  in a year   Signed: Charlton Haws 02/08/2023, 8:15 AM

## 2023-02-14 ENCOUNTER — Ambulatory Visit: Payer: 59 | Admitting: Cardiovascular Disease

## 2023-02-15 ENCOUNTER — Other Ambulatory Visit: Payer: Self-pay

## 2023-02-15 ENCOUNTER — Emergency Department (HOSPITAL_COMMUNITY)
Admission: EM | Admit: 2023-02-15 | Discharge: 2023-02-16 | Disposition: A | Discharge status: Home / Self Care | Payer: 59 | Attending: Emergency Medicine | Admitting: Emergency Medicine

## 2023-02-15 ENCOUNTER — Encounter (HOSPITAL_COMMUNITY): Payer: Self-pay

## 2023-02-15 DIAGNOSIS — K2970 Gastritis, unspecified, without bleeding: Secondary | ICD-10-CM | POA: Insufficient documentation

## 2023-02-15 DIAGNOSIS — K2900 Acute gastritis without bleeding: Secondary | ICD-10-CM

## 2023-02-15 DIAGNOSIS — R1030 Lower abdominal pain, unspecified: Secondary | ICD-10-CM

## 2023-02-15 DIAGNOSIS — K529 Noninfective gastroenteritis and colitis, unspecified: Secondary | ICD-10-CM | POA: Insufficient documentation

## 2023-02-15 LAB — URINALYSIS, ROUTINE W REFLEX MICROSCOPIC
Bilirubin Urine: NEGATIVE
Glucose, UA: NEGATIVE mg/dL
Ketones, ur: NEGATIVE mg/dL
Nitrite: NEGATIVE
Protein, ur: NEGATIVE mg/dL
Specific Gravity, Urine: 1.013 (ref 1.005–1.030)
pH: 5 (ref 5.0–8.0)

## 2023-02-15 LAB — COMPREHENSIVE METABOLIC PANEL
ALT: 17 U/L (ref 0–44)
AST: 17 U/L (ref 15–41)
Albumin: 3.9 g/dL (ref 3.5–5.0)
Alkaline Phosphatase: 57 U/L (ref 38–126)
Anion gap: 11 (ref 5–15)
BUN: 11 mg/dL (ref 8–23)
CO2: 25 mmol/L (ref 22–32)
Calcium: 9.6 mg/dL (ref 8.9–10.3)
Chloride: 105 mmol/L (ref 98–111)
Creatinine, Ser: 0.76 mg/dL (ref 0.44–1.00)
GFR, Estimated: >60 mL/min (ref >60)
Glucose, Bld: 100 mg/dL — ABNORMAL HIGH (ref 70–99)
Potassium: 3.4 mmol/L — ABNORMAL LOW (ref 3.5–5.1)
Sodium: 141 mmol/L (ref 135–145)
Total Bilirubin: 1.2 mg/dL (ref 0.0–1.2)
Total Protein: 7.2 g/dL (ref 6.5–8.1)

## 2023-02-15 LAB — CBC
HCT: 35.6 % — ABNORMAL LOW (ref 36.0–46.0)
Hemoglobin: 12.1 g/dL (ref 12.0–15.0)
MCH: 31.9 pg (ref 26.0–34.0)
MCHC: 34 g/dL (ref 30.0–36.0)
MCV: 93.9 fL (ref 80.0–100.0)
Platelets: 253 10*3/uL (ref 150–400)
RBC: 3.79 MIL/uL — ABNORMAL LOW (ref 3.87–5.11)
RDW: 12.9 % (ref 11.5–15.5)
WBC: 6.6 10*3/uL (ref 4.0–10.5)
nRBC: 0 % (ref 0.0–0.2)

## 2023-02-15 LAB — LIPASE, BLOOD: Lipase: 20 U/L (ref 11–51)

## 2023-02-15 NOTE — ED Triage Notes (Signed)
Pt to ED c/o lower abdominal pain x 4 days, reports last BM today and was normal, denies N/V.

## 2023-02-16 ENCOUNTER — Emergency Department (HOSPITAL_COMMUNITY): Payer: 59

## 2023-02-16 DIAGNOSIS — K529 Noninfective gastroenteritis and colitis, unspecified: Secondary | ICD-10-CM | POA: Diagnosis not present

## 2023-02-16 MED ORDER — FAMOTIDINE 20 MG PO TABS: 20.0000 mg | ORAL_TABLET | Freq: Two times a day (BID) | ORAL | 0 refills | Status: DC

## 2023-02-16 MED ORDER — ACETAMINOPHEN 500 MG PO TABS
1000.0000 mg | ORAL_TABLET | Freq: Once | ORAL | Status: AC
  Administered 2023-02-16: 1000 mg via ORAL
  Filled 2023-02-16: qty 2

## 2023-02-16 MED ORDER — IOHEXOL 350 MG/ML SOLN
75.0000 mL | Freq: Once | INTRAVENOUS | Status: AC | PRN
  Administered 2023-02-16: 75 mL via INTRAVENOUS

## 2023-02-16 NOTE — ED Provider Notes (Signed)
[place] EMERGENCY DEPARTMENT AT [facility] Provider Note   CSN: [id number] Arrival date & time: 02/15/23  1914     History  Chief Complaint  Patient presents with   Abdominal Pain    Brandi Mendez is a 68 y.o. female, history of Jehieli Brassell bowel obstruction, who presents to the ED secondary to bilateral lower quadrant abdominal pain, has been going on for the last 4 days.  She states that sharp and cramping, and is not alleviated by having any bowel movements.  Denies any nausea, vomiting.  States the pain is currently 6 out of 10, denies any urinary symptoms including frequency and urgency.  Has not tried any medications for this or heating pads.   Reports she had one hard stool several days ago, but the last few have been very soft.     Home Medications Prior to Admission medications   Medication Sig Start Date End Date Taking? Authorizing Provider  famotidine (PEPCID) 20 MG tablet Take 1 tablet (20 mg total) by mouth 2 (two) times daily. 02/16/23  Yes Delcenia Inman L, PA  acetaminophen (TYLENOL) 500 MG tablet Take 500 mg by mouth every 6 (six) hours as needed.    [provider]  ibuprofen (ADVIL) 400 MG tablet Take 1 tablet (400 mg total) by mouth every 6 (six) hours as needed (mild pain, fever >100.4). 05/18/22   Dameron, Nolberto Hanlon, DO  polyethylene glycol (MIRALAX) 17 g packet Take 17 g by mouth 2 (two) times daily. 05/18/22   Lincoln Brigham, MD  fluticasone (FLONASE) 50 MCG/ACT nasal spray Place 2 sprays into both nostrils daily. 01/24/19 08/07/19  Mullis, Kiersten P, DO      Allergies    Patient has no known allergies.    Review of Systems   Review of Systems  Gastrointestinal:  Positive for abdominal pain. Negative for nausea and vomiting.    Physical Exam Updated Vital Signs BP 110/68   Pulse (!) 56   Temp 98.4 F (36.9 C) (Oral)   Resp 16   SpO2 100%  Physical Exam Vitals and nursing note reviewed.  Constitutional:      General: She is not in acute  distress.    Appearance: She is well-developed.  HENT:     Head: Normocephalic and atraumatic.  Eyes:     Conjunctiva/sclera: Conjunctivae normal.  Cardiovascular:     Rate and Rhythm: Normal rate and regular rhythm.     Heart sounds: No murmur heard. Pulmonary:     Effort: Pulmonary effort is normal. No respiratory distress.     Breath sounds: Normal breath sounds.  Abdominal:     Palpations: Abdomen is soft.     Tenderness: There is abdominal tenderness in the right lower quadrant and left lower quadrant. There is no guarding or rebound.  Musculoskeletal:        General: No swelling.     Cervical back: Neck supple.  Skin:    General: Skin is warm and dry.     Capillary Refill: Capillary refill takes less than 2 seconds.  Neurological:     Mental Status: She is alert.  Psychiatric:        Mood and Affect: Mood normal.     ED Results / Procedures / Treatments   Labs (all labs ordered are listed, but only abnormal results are displayed) Labs Reviewed  COMPREHENSIVE METABOLIC PANEL - Abnormal; Notable for the following components:      Result Value   Potassium 3.4 (*)  Glucose, Bld 100 (*)    All other components within normal limits  CBC - Abnormal; Notable for the following components:   RBC 3.79 (*)    HCT 35.6 (*)    All other components within normal limits  URINALYSIS, ROUTINE W REFLEX MICROSCOPIC - Abnormal; Notable for the following components:   Hgb urine dipstick MODERATE (*)    Leukocytes,Ua LARGE (*)    Bacteria, UA FEW (*)    All other components within normal limits  LIPASE, BLOOD    EKG None  Radiology CT ABDOMEN PELVIS W CONTRAST Result Date: 02/16/2023 CLINICAL DATA:  Abdominal pain. EXAM: CT ABDOMEN AND PELVIS WITH CONTRAST TECHNIQUE: Multidetector CT imaging of the abdomen and pelvis was performed using the standard protocol following bolus administration of intravenous contrast. RADIATION DOSE REDUCTION: This exam was performed according to  the departmental dose-optimization program which includes automated exposure control, adjustment of the mA and/or kV according to patient size and/or use of iterative reconstruction technique. CONTRAST:  75mL OMNIPAQUE IOHEXOL 350 MG/ML SOLN COMPARISON:  May 16, 2022 FINDINGS: Lower chest: No acute abnormality. Hepatobiliary: No focal liver abnormality is seen. Status post cholecystectomy. No biliary dilatation. Pancreas: Unremarkable. No pancreatic ductal dilatation or surrounding inflammatory changes. Spleen: Normal in size without focal abnormality. Adrenals/Urinary Tract: Adrenal glands are unremarkable. Kidneys are normal in size, without renal calculi or hydronephrosis. A 2.6 cm diameter simple cyst is seen within the left kidney. Bladder is unremarkable. Stomach/Bowel: There is a Kelsa Jaworowski hiatal hernia. There is mild to moderate severity thickening of the gastric antrum. Appendix appears normal. Fluid-filled Parnell Spieler bowel loops within the mid left abdomen approach the upper limit of normal caliber. Vascular/Lymphatic: Aortic atherosclerosis. No enlarged abdominal or pelvic lymph nodes. Reproductive: Status post hysterectomy. No adnexal masses. Other: There is a Estella Malatesta fat containing umbilical hernia. Multiple surgical coils are seen along the anterior aspect of the mid to upper pelvis. Musculoskeletal: No acute or significant osseous findings. IMPRESSION: 1. Akiva Josey hiatal hernia. 2. Mild to moderate severity thickening of the gastric antrum which may represent gastritis. 3. Fluid-filled Azucena Dart bowel loops within the mid left abdomen which may represent sequelae associated with mild enteritis. 4. Evidence of prior cholecystectomy and hysterectomy. 5. Derisha Funderburke fat containing umbilical hernia. 6. Aortic atherosclerosis. Aortic Atherosclerosis (ICD10-I70.0). Electronically Signed   By: Aram Candela M.D.   On: 02/16/2023 01:07    Procedures Procedures    Medications Ordered in ED Medications  iohexol  (OMNIPAQUE) 350 MG/ML injection 75 mL (75 mLs Intravenous Contrast Given 02/16/23 0044)  acetaminophen (TYLENOL) tablet 1,000 mg (1,000 mg Oral Given 02/16/23 0146)    ED Course/ Medical Decision Making/ A&P                                 Medical Decision Making Patient is a 68 year old female, here with history of bowel obstructions, here for lower abdominal pain, for the last 4 days.  She states cramping in nature, she has tenderness to palpation on exam, no nausea/vomiting, given her history, we will obtain a CT abdomen pelvis for further evaluation as well as basic labs  Amount and/or Complexity of Data Reviewed Labs: ordered.    Details: Unremarkable Radiology: ordered.    Details: CT on pelvis shows findings concerning for gastroenteritis, and some gastritis Discussion of management or test interpretation with external provider(s): Discussed with patient, we will have her use heat, and Tylenol at home, for her gastroenteritis  or abdominal pain.  We discussed findings of gastritis concerning on CT.  Will start her on some Pepcid, to help with this, and have her follow-up with her PCP.  She is well-appearing on discharge, pain well-controlled discharged home  Risk OTC drugs. Prescription drug management.   Final Clinical Impression(s) / ED Diagnoses Final diagnoses:  Lower abdominal pain  Gastroenteritis  Acute gastritis without hemorrhage, unspecified gastritis type    Rx / DC Orders ED Discharge Orders          Ordered    famotidine (PEPCID) 20 MG tablet  2 times daily        02/16/23 0208              Pete Pelt, PA 02/16/23 0210    Dione Booze, MD 02/16/23 [phone]

## 2023-02-16 NOTE — Discharge Instructions (Addendum)
You have a GI bug, please make sure you are eating very bland foods, to help with your diarrhea.  You can take Tylenol for your pain, and additionally you have some acid reflux noted, thus we have started you on acid reflux medication.  Return to the ER if you have severe or worsening abdominal pain

## 2023-02-23 DIAGNOSIS — M5431 Sciatica, right side: Secondary | ICD-10-CM | POA: Diagnosis not present

## 2023-02-23 DIAGNOSIS — M25561 Pain in right knee: Secondary | ICD-10-CM | POA: Diagnosis not present

## 2023-02-28 ENCOUNTER — Other Ambulatory Visit: Payer: Self-pay

## 2023-02-28 DIAGNOSIS — I10 Essential (primary) hypertension: Secondary | ICD-10-CM

## 2023-02-28 DIAGNOSIS — J302 Other seasonal allergic rhinitis: Secondary | ICD-10-CM

## 2023-03-01 ENCOUNTER — Encounter: Payer: Self-pay | Admitting: Emergency Medicine

## 2023-03-01 ENCOUNTER — Ambulatory Visit: Payer: 59 | Attending: Physician Assistant | Admitting: Emergency Medicine

## 2023-03-01 VITALS — BP 104/70 | HR 91 | Ht 67.0 in | Wt 158.6 lb

## 2023-03-01 DIAGNOSIS — I10 Essential (primary) hypertension: Secondary | ICD-10-CM

## 2023-03-01 DIAGNOSIS — E782 Mixed hyperlipidemia: Secondary | ICD-10-CM

## 2023-03-01 DIAGNOSIS — Q210 Ventricular septal defect: Secondary | ICD-10-CM | POA: Diagnosis not present

## 2023-03-01 DIAGNOSIS — R002 Palpitations: Secondary | ICD-10-CM | POA: Diagnosis not present

## 2023-03-01 DIAGNOSIS — R809 Proteinuria, unspecified: Secondary | ICD-10-CM

## 2023-03-01 DIAGNOSIS — E1129 Type 2 diabetes mellitus with other diabetic kidney complication: Secondary | ICD-10-CM

## 2023-03-01 DIAGNOSIS — E7849 Other hyperlipidemia: Secondary | ICD-10-CM | POA: Diagnosis not present

## 2023-03-01 DIAGNOSIS — Q2542 Hypoplasia of aorta: Secondary | ICD-10-CM | POA: Diagnosis not present

## 2023-03-01 MED ORDER — CETIRIZINE HCL 10 MG PO TABS: 10.0000 mg | ORAL_TABLET | Freq: Every day | ORAL | 1 refills | Status: DC

## 2023-03-01 MED ORDER — LOSARTAN POTASSIUM-HCTZ 100-25 MG PO TABS: 1.0000 | ORAL_TABLET | Freq: Every day | ORAL | 1 refills | Status: DC

## 2023-03-01 NOTE — Progress Notes (Signed)
 Cardiology Office Note:    Date:  03/01/2023  ID:  Janice, Massey 03-Aug-1955, MRN [id number] PCP: Mercie Eon, MD  [facility] Providers Cardiologist:  Charlton Haws, MD       Patient Profile:      Janice Massey is a 68 y.o. female with visit-pertinent history of VSD, T2DM, HLD, hypertension  She has history of normal stress testing in May 2015 and December 2016.  She has history of restrictive VSD.  Most recent echocardiogram June 2022 showed stable small membranous VSD, mild LVH, EF of 60 to 65%, grade 1 diastolic dysfunction.  She was last seen in clinic over 2 years ago on 08/06/2020.  She was doing well from a cardiovascular standpoint.  She was staying active walking 3 times a week.  She did note ongoing leg cramps for greater than 2 months.  Her atorvastatin was decreased to 20 mg daily to see if leg cramps improved.  She was to follow-up in 1 year.      History of Present Illness:  Discussed the use of AI scribe software for clinical note transcription with the patient, who gave verbal consent to proceed.  Janice Massey is a 68 y.o. female who returns for 1 year follow-up for VSD.  Live sign language interpreter was used for this visit.  The patient comes into clinic today and denies any major cardiovascular concerns or complaints over the past year.  She does report occasional episodes of tachycardia/palpitations at night when she lays down for bed, occurring approximately once every six weeks.  These rare episodes last less than 1 minute.  The last episode was in August of 2024. These episodes do not wake the patient and are not associated with chest pain or significant shortness of breath.  Overall she denies any orthopnea, PND, syncope, lower extremity edema, exertional angina.  The patient maintains an active lifestyle, walking and exercising indoors.     Review of Systems  Constitutional: Negative for weight gain and weight loss.  Cardiovascular:  Negative for  chest pain, claudication, dyspnea on exertion, leg swelling, near-syncope, orthopnea, palpitations, paroxysmal nocturnal dyspnea and syncope.  Respiratory:  Negative for shortness of breath.   Neurological:  Negative for dizziness and light-headedness.     See HPI     Home Medications:    Prior to Admission medications   Medication Sig Start Date End Date Taking? Authorizing Provider  Accu-Chek FastClix Lancets MISC 1 Units by Does not apply route daily. Use once a daily in morning to check blood glucose 01/16/21   Quincy Simmonds, MD  aspirin 81 MG chewable tablet Chew 1 tablet (81 mg total) by mouth daily. 12/22/22 12/22/23  Mercie Eon, MD  atorvastatin (LIPITOR) 40 MG tablet Take 1 tablet (40 mg total) by mouth at bedtime. 10/13/22 10/13/23  Mercie Eon, MD  bisacodyl (DULCOLAX) 10 MG suppository Place 1 suppository (10 mg total) rectally as needed for moderate constipation. 12/22/22   Mercie Eon, MD  Blood Glucose Monitoring Suppl (ACCU-CHEK GUIDE) w/Device KIT 1 Units by Does not apply route daily. 01/16/21   Quincy Simmonds, MD  cetirizine (ZYRTEC) 10 MG tablet TAKE 1 TABLET(10 MG) BY MOUTH DAILY AS NEEDED FOR ALLERGIES 09/02/22   Mercie Eon, MD  dorzolamide-timolol (COSOPT) 2-0.5 % ophthalmic solution SMARTSIG:In Eye(s) 02/19/22   [provider]  Empagliflozin-metFORMIN HCl (SYNJARDY) 12.05-998 MG TABS Take 1 tablet by mouth 2 (two) times daily. 04/21/22 04/21/23  Mercie Eon, MD  glucose blood (ACCU-CHEK GUIDE)  test strip Use as instructed 01/16/21   Quincy Simmonds, MD  ketorolac Berkley Harvey) 0.5 % ophthalmic solution SMARTSIG:In Eye(s) 06/30/22   [provider]  latanoprost (XALATAN) 0.005 % ophthalmic solution INSTILL 1 DROP IN BOTH EYES EVERY DAY IN THE EVENING Patient not taking: Reported on 05/01/2021 10/06/20   Eliezer Bottom, MD  losartan-hydrochlorothiazide (HYZAAR) 100-25 MG tablet Take 1 tablet by mouth daily. 08/31/22   Mercie Eon, MD  omeprazole (PRILOSEC)  40 MG capsule Take 1 capsule (40 mg total) by mouth daily. 10/13/22 10/13/23  Mercie Eon, MD  polyethylene glycol (MIRALAX) 17 g packet Take 17 g by mouth 2 (two) times daily. 12/22/22   Mercie Eon, MD  prednisoLONE acetate (PRED FORTE) 1 % ophthalmic suspension SMARTSIG:In Eye(s) 06/30/22   [provider]  Semaglutide (RYBELSUS) 14 MG TABS Take 1 tablet (14 mg total) by mouth daily. 10/13/22 10/13/23  Mercie Eon, MD  senna-docusate (SENOKOT-S) 8.6-50 MG tablet Take 2 tablets by mouth 2 (two) times daily. 12/22/22   Mercie Eon, MD   Studies Reviewed:   EKG Interpretation Date/Time:  Tuesday March 01 2023 10:45:38 EST Ventricular Rate:  107 PR Interval:  160 QRS Duration:  114 QT Interval:  346 QTC Calculation: 461 R Axis:   73  Text Interpretation: Sinus tachycardia Confirmed by Rise Paganini [phone]) on 03/01/2023 11:46:53 AM    Echocardiogram 07/02/2020 1. Small membranous VSD. Left ventricular ejection fraction, by  estimation, is 60 to 65%. The left ventricle has normal function. The left  ventricle has no regional wall motion abnormalities. There is mild left  ventricular hypertrophy. Left ventricular  diastolic parameters are consistent with Grade I diastolic dysfunction  (impaired relaxation).   2. Right ventricular systolic function is normal. The right ventricular  size is normal.   3. The mitral valve is grossly normal. No evidence of mitral valve  regurgitation.   4. The aortic valve is tricuspid. Aortic valve regurgitation is not  visualized.   5. The inferior vena cava is normal in size with greater than 50%  respiratory variability, suggesting right atrial pressure of 3 mmHg.   Risk Assessment/Calculations:             Physical Exam:   VS:  BP 104/70   Pulse 91   Ht 5\' 7"  (1.702 m)   Wt 158 lb 9.6 oz (71.9 kg)   SpO2 96%   BMI 24.84 kg/m    Wt Readings from Last 3 Encounters:  03/01/23 158 lb 9.6 oz (71.9 kg)  12/22/22 160 lb 0.1 oz  (72.6 kg)  11/10/22 161 lb 12.8 oz (73.4 kg)    Constitutional:      Appearance: Normal and healthy appearance. Not in distress.  HENT:     Head: Normocephalic.  Neck:     Vascular: JVD normal.  Pulmonary:     Effort: Pulmonary effort is normal.     Breath sounds: Normal breath sounds.  Chest:     Chest wall: Not tender to palpatation.  Cardiovascular:     PMI at left midclavicular line. Normal rate. Regular rhythm. Normal S1. Normal S2.      Murmurs: There is no murmur.     No gallop.  No click. No rub.  Pulses:    Intact distal pulses.  Edema:    Peripheral edema absent.  Musculoskeletal: Normal range of motion.     Cervical back: Normal range of motion and neck supple. Skin:    General: Skin is warm and dry.  Neurological:     General: No focal deficit present.     Mental Status: Alert, oriented to person, place, and time and oriented to person, place and time.  Psychiatric:        Mood and Affect: Mood and affect normal.        Behavior: Behavior is cooperative.        Thought Content: Thought content normal.        Assessment and Plan:  Ventricular septal defect Most recent echocardiogram 06/2020 small stable membranous VSD, with normal biventricular function and no no RWMA, mild LVH, grade 1 DD She is doing well and overall asymptomatic.  She denies shortness of breath, DOE, orthopnea, chest pains, pedal edema, abdominal bloating -Ordered echocardiogram for routine monitoring of VSD  Hypertension Blood pressure 104/70 and under excellent control. -Continue monitoring BP at home -Continue losartan-HCTZ 100-25 mg daily  Hyperlipidemia LDL 87 on 04/2021 She has no established coronary disease and 2 prior normal stress test and 2015 and 2016.  Her LDL goal should be less than 100.  She will need a lipid panel updated.  She has ate this morning and does have issues coming into office to have labs drawn.  I will order direct LDL at this time. -Ordered direct  LDL -Continue atorvastatin 40 mg daily -Encouraged to maintain physical activity and heart healthy diet  Tachycardia / Palpitations She notes episodes of palpitations that occur primarily at night.  These episodes occur approximately every 6 months or longer.  Her last episode was in August 2024.  The palpitations last generally less than 1 minute.  This is occurred approximately twice since we last saw her in 2023.  Her episodes have not become more frequent.  Other than the singular episode she is completely asymptomatic -Her EKG today did show sinus tachycardia with a heart rate of 107 bpm.  After patient was able to rest her heart rate did improve to 91 bpm. -She will notify office if palpitations become more frequent than every 6 months.  At that time a heart monitor can be ordered. -We will continue to monitor at this time -TSH, BMET, Mg today. Recent CBC unremarkable   Type 2 diabetes A1c 6.6 on 12/2022 -Managed by PCP            Dispo:  Return in about 1 year (around 02/29/2024).  Signed, Denyce Robert, NP

## 2023-03-01 NOTE — Patient Instructions (Signed)
 Medication Instructions:  Your physician recommends that you continue on your current medications as directed. Please refer to the Current Medication list given to you today.  *If you need a refill on your cardiac medications before your next appointment, please call your pharmacy*   Lab Work: TODAY:  BMET, DIRECT LDL, TSH, & MAG  If you have labs (blood work) drawn today and your tests are completely normal, you will receive your results only by: MyChart Message (if you have MyChart) OR A paper copy in the mail If you have any lab test that is abnormal or we need to change your treatment, we will call you to review the results.   Testing/Procedures: Your physician has requested that you have an echocardiogram. Echocardiography is a painless test that uses sound waves to create images of your heart. It provides your doctor with information about the size and shape of your heart and how well your heart's chambers and valves are working. This procedure takes approximately one hour. There are no restrictions for this procedure. Please do NOT wear cologne, perfume, aftershave, or lotions (deodorant is allowed). Please arrive 15 minutes prior to your appointment time.  Please note: We ask at that you not bring children with you during ultrasound (echo/ vascular) testing. Due to room size and safety concerns, children are not allowed in the ultrasound rooms during exams. Our front office staff cannot provide observation of children in our lobby area while testing is being conducted. An adult accompanying a patient to their appointment will only be allowed in the ultrasound room at the discretion of the ultrasound technician under special circumstances. We apologize for any inconvenience.    Follow-Up: At [facility], you and your health needs are our priority.  As part of our continuing mission to provide you with exceptional heart care, we have created designated Provider Care Teams.   These Care Teams include your primary Cardiologist (physician) and Advanced Practice Providers (APPs -  Physician Assistants and Nurse Practitioners) who all work together to provide you with the care you need, when you need it.  We recommend signing up for the patient portal called "MyChart".  Sign up information is provided on this After Visit Summary.  MyChart is used to connect with patients for Virtual Visits (Telemedicine).  Patients are able to view lab/test results, encounter notes, upcoming appointments, etc.  Non-urgent messages can be sent to your provider as well.   To learn more about what you can do with MyChart, go to [email or web address].    Your next appointment:   1 year(s)  Provider:   Rise Paganini, NP         Other Instructions      1st Floor: - Lobby - Registration  - Pharmacy  - Lab - Cafe  2nd Floor: - PV Lab - Diagnostic Testing (echo, CT, nuclear med)  3rd Floor: - Vacant  4th Floor: - TCTS (cardiothoracic surgery) - AFib Clinic - Structural Heart Clinic - Vascular Surgery  - Vascular Ultrasound  5th Floor: - HeartCare Cardiology (general and EP) - Clinical Pharmacy for coumadin, hypertension, lipid, weight-loss medications, and med management appointments    Valet parking services will be available as well.

## 2023-03-02 LAB — BASIC METABOLIC PANEL
BUN/Creatinine Ratio: 17 (ref 12–28)
BUN: 15 mg/dL (ref 8–27)
CO2: 26 mmol/L (ref 20–29)
Calcium: 9.9 mg/dL (ref 8.7–10.3)
Chloride: 99 mmol/L (ref 96–106)
Creatinine, Ser: 0.86 mg/dL (ref 0.57–1.00)
Glucose: 141 mg/dL — ABNORMAL HIGH (ref 70–99)
Potassium: 3.5 mmol/L (ref 3.5–5.2)
Sodium: 141 mmol/L (ref 134–144)
eGFR: 74 mL/min/{1.73_m2} (ref >59)

## 2023-03-02 LAB — LDL CHOLESTEROL, DIRECT: LDL Direct: 84 mg/dL (ref 0–99)

## 2023-03-02 LAB — MAGNESIUM: Magnesium: 2.1 mg/dL (ref 1.6–2.3)

## 2023-03-02 LAB — TSH: TSH: 2.26 u[IU]/mL (ref 0.450–4.500)

## 2023-03-08 NOTE — Progress Notes (Deleted)
 [facility]: Clinic Note  Subjective:  History of Present Illness: Janice Massey is a 68 y.o. year old female who presents for 3 month follow up.  Saw Cardiology recently for her VSD. Was in sinus tachycardia  Has RLQ resolved? How is constipation? - Start back bowel regimen with Miralax BID, Senna/Docusate BID, and Bisacodyl & tap water enemas prn severe constipation - Also, when she was hospitalized in 04/2021 with constipation, her SGLT2 was thought to be a culprit leading to dehydration, so it was stopped. We had restarted empagliflozen (in synjardy) when A1C jumped up to 10.4 recently. When I call her with labs, will have a risk/benefit discussion about continuing this medicine   Type 2 diabetes mellitus (HCC) - For now, continue Synjardy 12.5mg /1000mg  BID & Semaglutide 14mg  daily  - Discuss SGLT2 use if her symptoms are from constipation - A1C down to 6.6 today!    Essential hypertension - Continue Losartan 100 / hydrochlorothiazide 25mg  daily for now - Could swap hydrochlorothiazide to amlodipine if she remains dehydrated leading to constipation.  Please refer to Assessment and Plan below for full details in Problem-Based Charting.   Past Medical History:  Patient Active Problem List   Diagnosis Date Noted   Right lower quadrant abdominal pain 12/22/2022   Cervical stenosis (uterine cervix) 02/29/2020   Healthcare maintenance 01/30/2020   VSD (ventricular septal defect), perimembranous 05/29/2013   Essential hypertension 08/10/2011   Hyperlipidemia 10/11/2007   Asthma 11/10/2005   GERD 11/10/2005   Deaf 11/10/2005   Type 2 diabetes mellitus (HCC) 04/05/2002      Medications:  Current Outpatient Medications:    Accu-Chek FastClix Lancets MISC, 1 Units by Does not apply route daily. Use once a daily in morning to check blood glucose, Disp: 102 each, Rfl: 3   aspirin 81 MG chewable tablet, Chew 1 tablet (81 mg total) by mouth daily., Disp: 90  tablet, Rfl: 3   atorvastatin (LIPITOR) 40 MG tablet, Take 1 tablet (40 mg total) by mouth at bedtime., Disp: 90 tablet, Rfl: 3   bisacodyl (DULCOLAX) 10 MG suppository, Place 1 suppository (10 mg total) rectally as needed for moderate constipation., Disp: 12 suppository, Rfl: 0   Blood Glucose Monitoring Suppl (ACCU-CHEK GUIDE) w/Device KIT, 1 Units by Does not apply route daily., Disp: 1 kit, Rfl: 0   cetirizine (ZYRTEC) 10 MG tablet, Take 1 tablet (10 mg total) by mouth daily., Disp: 90 tablet, Rfl: 1   dorzolamide-timolol (COSOPT) 2-0.5 % ophthalmic solution, SMARTSIG:In Eye(s), Disp: , Rfl:    Empagliflozin-metFORMIN HCl (SYNJARDY) 12.05-998 MG TABS, Take 1 tablet by mouth 2 (two) times daily., Disp: 180 tablet, Rfl: 3   glucose blood (ACCU-CHEK GUIDE) test strip, Use as instructed, Disp: 100 each, Rfl: 12   ketorolac (ACULAR) 0.5 % ophthalmic solution, SMARTSIG:In Eye(s), Disp: , Rfl:    latanoprost (XALATAN) 0.005 % ophthalmic solution, INSTILL 1 DROP IN BOTH EYES EVERY DAY IN THE EVENING, Disp: 2.5 mL, Rfl: 1   losartan-hydrochlorothiazide (HYZAAR) 100-25 MG tablet, Take 1 tablet by mouth daily., Disp: 90 tablet, Rfl: 1   omeprazole (PRILOSEC) 40 MG capsule, Take 1 capsule (40 mg total) by mouth daily., Disp: 90 capsule, Rfl: 3   polyethylene glycol (MIRALAX) 17 g packet, Take 17 g by mouth 2 (two) times daily., Disp: 14 each, Rfl: 0   prednisoLONE acetate (PRED FORTE) 1 % ophthalmic suspension, SMARTSIG:In Eye(s), Disp: , Rfl:    Semaglutide (RYBELSUS) 14 MG TABS, Take 1 tablet (14  mg total) by mouth daily., Disp: 90 tablet, Rfl: 3   senna-docusate (SENOKOT-S) 8.6-50 MG tablet, Take 2 tablets by mouth 2 (two) times daily., Disp: 30 tablet, Rfl: 0   Allergies: Allergies  Allergen Reactions   Codeine Itching   Lisinopril Cough   Penicillins Itching    Has patient had a PCN reaction causing immediate rash, facial/tongue/throat swelling, SOB or lightheadedness with hypotension: YES Has  patient had a PCN reaction causing severe rash involving mucus membranes or skin necrosis: NO Has patient had a PCN reaction that required hospitalization NO Has patient had a PCN reaction occurring within the last 10 years: NO If all of the above answers are "NO", then may proceed with Cephalosporin use.       Objective:   Vitals: There were no vitals filed for this visit.   Physical Exam: Physical Exam   Data: Labs, imaging, and micro were reviewed in Epic. Refer to Assessment and Plan below for full details in Problem-Based Charting.  Assessment & Plan:  No problem-specific Assessment & Plan notes found for this encounter.     Patient will follow up in ***  Mercie Eon, MD

## 2023-03-09 ENCOUNTER — Ambulatory Visit (INDEPENDENT_AMBULATORY_CARE_PROVIDER_SITE_OTHER): Admitting: Internal Medicine

## 2023-03-09 ENCOUNTER — Encounter: Payer: 59 | Admitting: Internal Medicine

## 2023-03-09 VITALS — BP 129/74 | HR 91 | Temp 98.5°F | Ht 67.0 in | Wt 163.0 lb

## 2023-03-09 DIAGNOSIS — E1129 Type 2 diabetes mellitus with other diabetic kidney complication: Secondary | ICD-10-CM

## 2023-03-09 DIAGNOSIS — R809 Proteinuria, unspecified: Secondary | ICD-10-CM | POA: Diagnosis not present

## 2023-03-09 DIAGNOSIS — Z1231 Encounter for screening mammogram for malignant neoplasm of breast: Secondary | ICD-10-CM

## 2023-03-09 DIAGNOSIS — Z7985 Long-term (current) use of injectable non-insulin antidiabetic drugs: Secondary | ICD-10-CM

## 2023-03-09 DIAGNOSIS — Z0000 Encounter for general adult medical examination without abnormal findings: Secondary | ICD-10-CM

## 2023-03-09 DIAGNOSIS — I10 Essential (primary) hypertension: Secondary | ICD-10-CM | POA: Diagnosis not present

## 2023-03-09 DIAGNOSIS — E119 Type 2 diabetes mellitus without complications: Secondary | ICD-10-CM

## 2023-03-09 DIAGNOSIS — Z794 Long term (current) use of insulin: Secondary | ICD-10-CM | POA: Diagnosis not present

## 2023-03-09 LAB — POCT GLYCOSYLATED HEMOGLOBIN (HGB A1C): Hemoglobin A1C: 7.9 % — AB (ref 4.0–5.6)

## 2023-03-09 LAB — GLUCOSE, CAPILLARY: Glucose-Capillary: 170 mg/dL — ABNORMAL HIGH (ref 70–99)

## 2023-03-09 MED ORDER — RYBELSUS 14 MG PO TABS: 14.0000 mg | ORAL_TABLET | Freq: Every day | ORAL | 3 refills | Status: DC

## 2023-03-09 MED ORDER — PIOGLITAZONE HCL 15 MG PO TABS
15.0000 mg | ORAL_TABLET | Freq: Every day | ORAL | 11 refills | Status: DC
Start: 2023-03-09 — End: 2023-05-11

## 2023-03-09 NOTE — Assessment & Plan Note (Signed)
 Order placed for screening mammogram

## 2023-03-09 NOTE — Patient Instructions (Signed)
 Thank you, Janice Massey for allowing [place] to provide your care today. Today we discussed:  Your blood pressure is excellent! Keep taking losartan/hydrochlorothiazide everyday  Diabetes: Your A1c is slightly higher - keep taking synjardy twice a day, rybelsus once a day, and START actos once a day   I have ordered the following labs for you:   Lab Orders         Glucose, capillary         POC Hbg A1C       Referrals ordered today:   Referral Orders  No referral(s) requested today     I have ordered the following medication/changed the following medications:   Stop the following medications: Medications Discontinued During This Encounter  Medication Reason   Semaglutide (RYBELSUS) 14 MG TABS Reorder     Start the following medications: Meds ordered this encounter  Medications   Semaglutide (RYBELSUS) 14 MG TABS    Sig: Take 1 tablet (14 mg total) by mouth daily.    Dispense:  90 tablet    Refill:  3   pioglitazone (ACTOS) 15 MG tablet    Sig: Take 1 tablet (15 mg total) by mouth daily.    Dispense:  30 tablet    Refill:  11     Follow up: 3 months     Should you have any questions or concerns please call the internal medicine clinic at [phone].     Elza Rafter, D.O. [facility]

## 2023-03-09 NOTE — Assessment & Plan Note (Signed)
 Blood pressure is well-controlled on losartan 100-HCTZ 25 mg daily.  Could swap HCTZ for amlodipine in the future if she continues to have episodes of dehydration leading to constipation.

## 2023-03-09 NOTE — Progress Notes (Signed)
 CC: routine follow up  HPI:  Janice Massey is a 68 y.o. female living with a history stated below and presents today for a follow up of her diabetes and HTN. Encounter conducted with assistance of ASL interpretor Please see problem based assessment and plan for additional details.  Past Medical History:  Diagnosis Date   Back pain    Breast lump on left side at 5 o'clock position 07/31/2019   Cardiac murmur    small membranous VSD with fairly loud cardiac murmur (asymptomatic)    Chronic PID (chronic pelvic inflammatory disease)    Deaf    Since age 58   Diabetes mellitus 2004   Greater trochanteric pain syndrome 04/02/2015   History of Doppler ultrasound 06/20/2009   Normal, no prior studies for comparison.    Hypertension    Mute    Obese    URI (upper respiratory infection)    Ventricular septal defect 2004 per echo   small membranous VSD    Current Outpatient Medications on File Prior to Visit  Medication Sig Dispense Refill   Accu-Chek FastClix Lancets MISC 1 Units by Does not apply route daily. Use once a daily in morning to check blood glucose 102 each 3   aspirin 81 MG chewable tablet Chew 1 tablet (81 mg total) by mouth daily. 90 tablet 3   atorvastatin (LIPITOR) 40 MG tablet Take 1 tablet (40 mg total) by mouth at bedtime. 90 tablet 3   bisacodyl (DULCOLAX) 10 MG suppository Place 1 suppository (10 mg total) rectally as needed for moderate constipation. 12 suppository 0   Blood Glucose Monitoring Suppl (ACCU-CHEK GUIDE) w/Device KIT 1 Units by Does not apply route daily. 1 kit 0   cetirizine (ZYRTEC) 10 MG tablet Take 1 tablet (10 mg total) by mouth daily. 90 tablet 1   dorzolamide-timolol (COSOPT) 2-0.5 % ophthalmic solution SMARTSIG:In Eye(s)     Empagliflozin-metFORMIN HCl (SYNJARDY) 12.05-998 MG TABS Take 1 tablet by mouth 2 (two) times daily. 180 tablet 3   glucose blood (ACCU-CHEK GUIDE) test strip Use as instructed 100 each 12   ketorolac (ACULAR) 0.5 %  ophthalmic solution SMARTSIG:In Eye(s)     latanoprost (XALATAN) 0.005 % ophthalmic solution INSTILL 1 DROP IN BOTH EYES EVERY DAY IN THE EVENING 2.5 mL 1   losartan-hydrochlorothiazide (HYZAAR) 100-25 MG tablet Take 1 tablet by mouth daily. 90 tablet 1   omeprazole (PRILOSEC) 40 MG capsule Take 1 capsule (40 mg total) by mouth daily. 90 capsule 3   polyethylene glycol (MIRALAX) 17 g packet Take 17 g by mouth 2 (two) times daily. 14 each 0   prednisoLONE acetate (PRED FORTE) 1 % ophthalmic suspension SMARTSIG:In Eye(s)     senna-docusate (SENOKOT-S) 8.6-50 MG tablet Take 2 tablets by mouth 2 (two) times daily. 30 tablet 0   No current facility-administered medications on file prior to visit.    Family History  Problem Relation Age of Onset   Cancer Mother        liver   Heart attack Mother 2   Hypertension Father    Diabetes Father    Heart attack Father    CAD Father 38   Diabetes Paternal Aunt    Diabetes Maternal Grandfather    Breast cancer Cousin    Colon cancer Neg Hx    Endometrial cancer Neg Hx    Pancreatic cancer Neg Hx    Prostate cancer Neg Hx    Ovarian cancer Neg Hx  Social History   Socioeconomic History   Marital status: Single    Spouse name: Not on file   Number of children: 1   Years of education: Not on file   Highest education level: Not on file  Occupational History    Employer: UNEMPLOYED  Tobacco Use   Smoking status: Never   Smokeless tobacco: Never  Vaping Use   Vaping status: Never Used  Substance and Sexual Activity   Alcohol use: No    Alcohol/week: 0.0 standard drinks of alcohol   Drug use: No   Sexual activity: Not Currently    Birth control/protection: Surgical  Other Topics Concern   Not on file  Social History Narrative   Not on file   Social Drivers of Health   Financial Resource Strain: Low Risk  (01/20/2022)   Overall Financial Resource Strain (CARDIA)    Difficulty of Paying Living Expenses: Not very hard  Food  Insecurity: No Food Insecurity (01/20/2022)   Hunger Vital Sign    Worried About Running Out of Food in the Last Year: Never true    Ran Out of Food in the Last Year: Never true  Transportation Needs: No Transportation Needs (01/20/2022)   PRAPARE - Administrator, Civil Service (Medical): No    Lack of Transportation (Non-Medical): No  Physical Activity: Insufficiently Active (01/20/2022)   Exercise Vital Sign    Days of Exercise per Week: 1 day    Minutes of Exercise per Session: 30 min  Stress: No Stress Concern Present (01/20/2022)   [employer] of Occupational Health - Occupational Stress Questionnaire    Feeling of Stress : Not at all  Social Connections: Socially Isolated (01/20/2022)   Social Connection and Isolation Panel [NHANES]    Frequency of Communication with Friends and Family: More than three times a week    Frequency of Social Gatherings with Friends and Family: Once a week    Attends Religious Services: Never    Database administrator or Organizations: No    Attends Banker Meetings: Never    Marital Status: Never married  Catering manager Violence: Not on file    Review of Systems: ROS negative except for what is noted on the assessment and plan.  Vitals:   03/09/23 0925  BP: 129/74  Pulse: 91  Temp: 98.5 F (36.9 C)  TempSrc: Oral  SpO2: 98%  Weight: 163 lb (73.9 kg)  Height: 5\' 7"  (1.702 m)    Physical Exam: Constitutional: appears well, NAD Cardiovascular: regular rate and rhythm Pulmonary/Chest: normal work of breathing on room air, lungs clear to auscultation bilaterally Skin: warm and dry Psych: normal mood and behavior  Assessment & Plan:    Patient discussed with Dr. Cleda Daub  Essential hypertension Blood pressure is well-controlled on losartan 100-HCTZ 25 mg daily.  Could swap HCTZ for amlodipine in the future if she continues to have episodes of dehydration leading to constipation.  Type 2 diabetes  mellitus (HCC) A1c is increased from 6.6% to 7.9%.  Will continue Synjardy 12.5 mg - 1000 mg twice daily and semaglutide 14 mg daily.  Will also start low-dose Actos 15 mg daily.  Follow-up in 3 months  Healthcare maintenance - Order placed for screening mammogram   Silveria Botz, D.O. [facility] Internal Medicine, PGY-3 Phone: [phone] Date 03/09/2023 Time 11:02 AM

## 2023-03-09 NOTE — Assessment & Plan Note (Signed)
 A1c is increased from 6.6% to 7.9%.  Will continue Synjardy 12.5 mg - 1000 mg twice daily and semaglutide 14 mg daily.  Will also start low-dose Actos 15 mg daily.  Follow-up in 3 months

## 2023-03-10 ENCOUNTER — Telehealth: Payer: Self-pay

## 2023-03-10 NOTE — Telephone Encounter (Signed)
 Janice Massey (Key: [id number]) Rx #: [id number] Rybelsus 14MG  tablets Form [employer] Medicare Part D Electronic Prior Authorization Form (2017 NCPDP) Created Message from Plan Request Reference Number: [id number]. RYBELSUS TAB 14MG  is approved through 01/04/2024. Your patient may now fill this prescription and it will be covered.. Authorization Expiration Date: January 04, 2024.

## 2023-03-10 NOTE — Telephone Encounter (Signed)
 Prior Authorization for patient (Rybelsus 14MG  tablets) came through on cover my meds was submitted with last office notes and labs awaiting approval or denial.  ZOX:[id number]

## 2023-03-15 NOTE — Progress Notes (Signed)
 Internal Medicine Clinic Attending  Case discussed with the resident at the time of the visit.  We reviewed the resident's history and exam and pertinent patient test results.  I agree with the assessment, diagnosis, and plan of care documented in the resident's note.

## 2023-03-21 ENCOUNTER — Ambulatory Visit (INDEPENDENT_AMBULATORY_CARE_PROVIDER_SITE_OTHER)

## 2023-03-21 ENCOUNTER — Ambulatory Visit (INDEPENDENT_AMBULATORY_CARE_PROVIDER_SITE_OTHER): Admitting: Podiatry

## 2023-03-21 DIAGNOSIS — M79672 Pain in left foot: Secondary | ICD-10-CM | POA: Diagnosis not present

## 2023-03-21 DIAGNOSIS — M7672 Peroneal tendinitis, left leg: Secondary | ICD-10-CM | POA: Diagnosis not present

## 2023-03-22 DIAGNOSIS — M7672 Peroneal tendinitis, left leg: Secondary | ICD-10-CM | POA: Diagnosis not present

## 2023-03-22 MED ORDER — TRIAMCINOLONE ACETONIDE 10 MG/ML IJ SUSP
10.0000 mg | Freq: Once | INTRAMUSCULAR | Status: AC
  Administered 2023-03-22: 10 mg via INTRA_ARTICULAR

## 2023-03-22 NOTE — Progress Notes (Signed)
 Subjective:   Patient ID: Janice Massey, female   DOB: 68 y.o.   MRN: [id number]   HPI Patient presents with interpreter of having a lot of pain in the outside of the left foot with inflammation fluid buildup and states it has been sore for several months.  States that she has tried some ice and good shoe gear and interpreter helps her with this condition.  Does not smoke likes to be active   Review of Systems  All other systems reviewed and are negative.       Objective:  Physical Exam Vitals and nursing note reviewed.  Constitutional:      Appearance: She is well-developed.  Pulmonary:     Effort: Pulmonary effort is normal.  Musculoskeletal:        General: Normal range of motion.  Skin:    General: Skin is warm.  Neurological:     Mental Status: She is alert.     Neurovascular status intact muscle strength found to be adequate range of motion within normal limits with a well structured bunion correction that I had done a number of years ago with inflammation pain around the fifth metatarsal base fluid buildup no indication tendon dysfunction with good digital perfusion well-oriented x 3 and flap     Assessment:  Tori tendinitis of the peroneal tendon left with fluid buildup at the insertional point     Plan:  P x-rays reviewed and I have recommended careful injection explaining risk through interpreter and patient wants this done understanding risk with sterile prep injected the insertional point of the tendon fifth metatarsal base peroneal 3 mg Dexasone Kenalog 5 mg Xylocaine advised on support shoes reduced activity ice and reappoint as needed  X-rays indicate no signs of Pathology left lateral foot with pin in place left first MPJ

## 2023-03-29 ENCOUNTER — Encounter: Payer: Self-pay | Admitting: *Deleted

## 2023-03-30 ENCOUNTER — Ambulatory Visit (HOSPITAL_COMMUNITY): Payer: 59 | Attending: Cardiology

## 2023-03-30 DIAGNOSIS — Q210 Ventricular septal defect: Secondary | ICD-10-CM | POA: Diagnosis not present

## 2023-03-30 DIAGNOSIS — I10 Essential (primary) hypertension: Secondary | ICD-10-CM | POA: Diagnosis not present

## 2023-03-30 LAB — ECHOCARDIOGRAM COMPLETE
Area-P 1/2: 2.91 cm2
S' Lateral: 3.1 cm

## 2023-04-01 ENCOUNTER — Telehealth: Payer: Self-pay

## 2023-04-01 NOTE — Telephone Encounter (Signed)
 Patient was identified as falling into the [place] Measure - Diabetes.   Patient was: Appointment scheduled with primary care provider in the next 30 days.

## 2023-04-05 DIAGNOSIS — B351 Tinea unguium: Secondary | ICD-10-CM | POA: Diagnosis not present

## 2023-04-21 ENCOUNTER — Encounter: Payer: Self-pay | Admitting: Podiatry

## 2023-04-21 ENCOUNTER — Ambulatory Visit (INDEPENDENT_AMBULATORY_CARE_PROVIDER_SITE_OTHER): Admitting: Podiatry

## 2023-04-21 DIAGNOSIS — R809 Proteinuria, unspecified: Secondary | ICD-10-CM

## 2023-04-21 DIAGNOSIS — M79675 Pain in left toe(s): Secondary | ICD-10-CM

## 2023-04-21 DIAGNOSIS — E1129 Type 2 diabetes mellitus with other diabetic kidney complication: Secondary | ICD-10-CM | POA: Diagnosis not present

## 2023-04-21 DIAGNOSIS — M79674 Pain in right toe(s): Secondary | ICD-10-CM | POA: Diagnosis not present

## 2023-04-21 DIAGNOSIS — B351 Tinea unguium: Secondary | ICD-10-CM | POA: Diagnosis not present

## 2023-04-21 NOTE — Progress Notes (Signed)
 This patient returns to my office for at risk foot care.  This patient requires this care by a professional since this patient will be at risk due to having type 2 diabetes. This patient is unable to cut nails herself since the patient cannot reach her nails.These nails are painful walking and wearing shoes.  This patient presents for at risk foot care today. She presents to the office with a sign interpreter.  General Appearance  Alert, conversant and in no acute stress.  Vascular  Dorsalis pedis and posterior tibial  pulses are palpable  bilaterally.  Capillary return is within normal limits  bilaterally. Temperature is within normal limits  bilaterally.  Neurologic  Senn-Weinstein monofilament wire test within normal limits  bilaterally. Muscle power within normal limits bilaterally.  Nails Thick disfigured discolored nails with subungual debris  hallux nails bilaterally. The left hallux nail is unattached to nail bed.  No evidence of bacterial infection or drainage bilaterally.  Orthopedic  No limitations of motion  feet .  No crepitus or effusions noted.  No bony pathology or digital deformities noted.  Skin  normotropic skin with no porokeratosis noted bilaterally.  No signs of infections or ulcers noted.     Onychomycosis  Pain in right toes  Pain in left toes  Consent was obtained for treatment procedures.   Mechanical debridement of nails 1-5  bilaterally performed with a nail nipper.  Filed with dremel without incident.    Return office visit    3 months                  Told patient to return for periodic foot care and evaluation due to potential at risk complications.   Ruffin Cotton DPM

## 2023-04-27 ENCOUNTER — Other Ambulatory Visit: Payer: Self-pay | Admitting: Internal Medicine

## 2023-04-27 DIAGNOSIS — R809 Proteinuria, unspecified: Secondary | ICD-10-CM

## 2023-04-27 MED ORDER — RYBELSUS 14 MG PO TABS: 14.0000 mg | ORAL_TABLET | Freq: Every day | ORAL | 3 refills | Status: DC

## 2023-04-27 NOTE — Telephone Encounter (Signed)
 [facility] on [address] is currently closed d/t no pharmacist per [employer] on [address].

## 2023-04-27 NOTE — Telephone Encounter (Signed)
 Copied from CRM [id number]. Topic: Clinical - Medication Refill >> Apr 27, 2023  2:33 PM Shelby Dessert H wrote: Most Recent Primary Care Visit:  Provider: ATWAY, RAYANN N  Department: [facility]  Visit Type: OPEN ESTABLISHED  Date: 03/09/2023  Medication: Semaglutide  (RYBELSUS ) 14 MG TABS, Synjardy    Has the patient contacted their pharmacy? Yes (Agent: If no, request that the patient contact the pharmacy for the refill. If patient does not wish to contact the pharmacy document the reason why and proceed with request.) (Agent: If yes, when and what did the pharmacy advise?)  Is this the correct pharmacy for this prescription? Yes If no, delete pharmacy and type the correct one.  This is the patient's preferred pharmacy:  [facility] [address] [place], [place], [address] Get directions [phone]  Has the prescription been filled recently? No  Is the patient out of the medication? Yes  Has the patient been seen for an appointment in the last year OR does the patient have an upcoming appointment? Yes  Can we respond through MyChart? Yes  Agent: Please be advised that Rx refills may take up to 3 business days. We ask that you follow-up with your pharmacy.

## 2023-05-10 NOTE — Progress Notes (Unsigned)
 [facility]: Clinic Note  Subjective:  History of Present Illness: Janice Massey is a 68 y.o. year old female who presents for routine follow up of her chronic medical conditions.  # HTN  # T2DM Started actos  last time  HCM  - mmg ordered last time- scheduled  Discuss DEXA      Please refer to Assessment and Plan below for full details in Problem-Based Charting.   Past Medical History:  Patient Active Problem List   Diagnosis Date Noted   Pain due to onychomycosis of toenails of both feet 04/21/2023   Right lower quadrant abdominal pain 12/22/2022   Cervical stenosis (uterine cervix) 02/29/2020   Healthcare maintenance 01/30/2020   VSD (ventricular septal defect), perimembranous 05/29/2013   Essential hypertension 08/10/2011   Hyperlipidemia 10/11/2007   Asthma 11/10/2005   GERD 11/10/2005   Deaf 11/10/2005   Type 2 diabetes mellitus (HCC) 04/05/2002      Medications:  Current Outpatient Medications:    Accu-Chek FastClix Lancets MISC, 1 Units by Does not apply route daily. Use once a daily in morning to check blood glucose, Disp: 102 each, Rfl: 3   aspirin  81 MG chewable tablet, Chew 1 tablet (81 mg total) by mouth daily., Disp: 90 tablet, Rfl: 3   atorvastatin  (LIPITOR) 40 MG tablet, Take 1 tablet (40 mg total) by mouth at bedtime., Disp: 90 tablet, Rfl: 3   bisacodyl  (DULCOLAX) 10 MG suppository, Place 1 suppository (10 mg total) rectally as needed for moderate constipation., Disp: 12 suppository, Rfl: 0   Blood Glucose Monitoring Suppl (ACCU-CHEK GUIDE) w/Device KIT, 1 Units by Does not apply route daily., Disp: 1 kit, Rfl: 0   cetirizine  (ZYRTEC ) 10 MG tablet, Take 1 tablet (10 mg total) by mouth daily., Disp: 90 tablet, Rfl: 1   dorzolamide-timolol (COSOPT) 2-0.5 % ophthalmic solution, SMARTSIG:In Eye(s), Disp: , Rfl:    Empagliflozin -metFORMIN  HCl (SYNJARDY ) 05-998 MG TABS, Take by mouth., Disp: , Rfl:    glucose blood (ACCU-CHEK  GUIDE) test strip, Use as instructed, Disp: 100 each, Rfl: 12   ketorolac  (ACULAR ) 0.5 % ophthalmic solution, SMARTSIG:In Eye(s), Disp: , Rfl:    latanoprost  (XALATAN ) 0.005 % ophthalmic solution, INSTILL 1 DROP IN BOTH EYES EVERY DAY IN THE EVENING, Disp: 2.5 mL, Rfl: 1   losartan -hydrochlorothiazide  (HYZAAR) 100-25 MG tablet, Take 1 tablet by mouth daily., Disp: 90 tablet, Rfl: 1   omeprazole  (PRILOSEC) 40 MG capsule, Take 1 capsule (40 mg total) by mouth daily., Disp: 90 capsule, Rfl: 3   pioglitazone  (ACTOS ) 15 MG tablet, Take 1 tablet (15 mg total) by mouth daily., Disp: 30 tablet, Rfl: 11   polyethylene glycol (MIRALAX ) 17 g packet, Take 17 g by mouth 2 (two) times daily., Disp: 14 each, Rfl: 0   prednisoLONE acetate (PRED FORTE) 1 % ophthalmic suspension, SMARTSIG:In Eye(s), Disp: , Rfl:    Semaglutide  (RYBELSUS ) 14 MG TABS, Take 1 tablet (14 mg total) by mouth daily., Disp: 90 tablet, Rfl: 3   senna-docusate (SENOKOT-S) 8.6-50 MG tablet, Take 2 tablets by mouth 2 (two) times daily., Disp: 30 tablet, Rfl: 0   Allergies: Allergies  Allergen Reactions   Codeine Itching   Lisinopril  Cough   Penicillins Itching    Has patient had a PCN reaction causing immediate rash, facial/tongue/throat swelling, SOB or lightheadedness with hypotension: YES Has patient had a PCN reaction causing severe rash involving mucus membranes or skin necrosis: NO Has patient had a PCN reaction that required hospitalization NO Has  patient had a PCN reaction occurring within the last 10 years: NO If all of the above answers are "NO", then may proceed with Cephalosporin use.       Objective:   Vitals: There were no vitals filed for this visit.   Physical Exam: Physical Exam   Data: Labs, imaging, and micro were reviewed in Epic. Refer to Assessment and Plan below for full details in Problem-Based Charting.  Assessment & Plan:  No problem-specific Assessment & Plan notes found for this encounter.      Patient will follow up in ***  Driscilla George, MD

## 2023-05-11 ENCOUNTER — Encounter: Payer: Self-pay | Admitting: Internal Medicine

## 2023-05-11 ENCOUNTER — Ambulatory Visit (INDEPENDENT_AMBULATORY_CARE_PROVIDER_SITE_OTHER): Admitting: Internal Medicine

## 2023-05-11 VITALS — BP 118/69 | HR 93 | Temp 98.0°F | Ht 67.0 in | Wt 158.4 lb

## 2023-05-11 DIAGNOSIS — E1129 Type 2 diabetes mellitus with other diabetic kidney complication: Secondary | ICD-10-CM | POA: Diagnosis not present

## 2023-05-11 DIAGNOSIS — Z8639 Personal history of other endocrine, nutritional and metabolic disease: Secondary | ICD-10-CM

## 2023-05-11 DIAGNOSIS — R809 Proteinuria, unspecified: Secondary | ICD-10-CM | POA: Diagnosis not present

## 2023-05-11 DIAGNOSIS — Z0000 Encounter for general adult medical examination without abnormal findings: Secondary | ICD-10-CM

## 2023-05-11 DIAGNOSIS — E119 Type 2 diabetes mellitus without complications: Secondary | ICD-10-CM | POA: Diagnosis not present

## 2023-05-11 DIAGNOSIS — E785 Hyperlipidemia, unspecified: Secondary | ICD-10-CM | POA: Diagnosis not present

## 2023-05-11 DIAGNOSIS — K219 Gastro-esophageal reflux disease without esophagitis: Secondary | ICD-10-CM | POA: Diagnosis not present

## 2023-05-11 DIAGNOSIS — E550 Rickets, active: Secondary | ICD-10-CM | POA: Diagnosis not present

## 2023-05-11 DIAGNOSIS — I10 Essential (primary) hypertension: Secondary | ICD-10-CM | POA: Diagnosis not present

## 2023-05-11 DIAGNOSIS — Z13820 Encounter for screening for osteoporosis: Secondary | ICD-10-CM

## 2023-05-11 MED ORDER — PIOGLITAZONE HCL 15 MG PO TABS: 15.0000 mg | ORAL_TABLET | Freq: Every day | ORAL | 3 refills | Status: DC

## 2023-05-11 MED ORDER — RYBELSUS 14 MG PO TABS: 14.0000 mg | ORAL_TABLET | Freq: Every day | ORAL | 3 refills | Status: DC

## 2023-05-11 MED ORDER — LOSARTAN POTASSIUM-HCTZ 100-25 MG PO TABS: 1.0000 | ORAL_TABLET | Freq: Every day | ORAL | 3 refills | Status: DC

## 2023-05-11 NOTE — Assessment & Plan Note (Addendum)
-   MMG scheduled - DEXA ordered today and our office is assisting her with scheduling - While discussing bone health, patient raised a concern for possible vitamin d deficiency - will check levels today before initiating supplements possibly

## 2023-05-11 NOTE — Patient Instructions (Signed)
 Thank you, Ms.Loanne Rim for allowing us  to provide your care today. Today we discussed your blood pressure, your diabetes, and your bone scan (DEXA scan).    I have ordered the following labs for you:  Lab Orders  No laboratory test(s) ordered today     Tests ordered today:  DEXA scan   Referrals ordered today:   Referral Orders  No referral(s) requested today     I have ordered the following medication/changed the following medications:    Start the following medications: Meds ordered this encounter  Medications   losartan -hydrochlorothiazide  (HYZAAR) 100-25 MG tablet    Sig: Take 1 tablet by mouth daily.    Dispense:  90 tablet    Refill:  3   pioglitazone  (ACTOS ) 15 MG tablet    Sig: Take 1 tablet (15 mg total) by mouth daily.    Dispense:  90 tablet    Refill:  3   Semaglutide  (RYBELSUS ) 14 MG TABS    Sig: Take 1 tablet (14 mg total) by mouth daily.    Dispense:  90 tablet    Refill:  3     Return in about 3 months (around 08/11/2023) for Diabetes.     Should you have any questions or concerns please call the internal medicine clinic at [phone].     Yuridia Couts, MD Faculty, Internal Medicine Teaching Progam [facility]

## 2023-05-11 NOTE — Assessment & Plan Note (Addendum)
-   A1C 7.9% at last visit in March. We're a little early for A1C, but I'm going to go ahead and check to see if we need to adjust her medicines more - Continue Synjardy  12.5mg -1000mg  BID, Semaglutide  14mg  daily, and Actos  15mg  daily pending A1C

## 2023-05-11 NOTE — Assessment & Plan Note (Signed)
-   chronic and stable - continue losartan  100-hydrochlorothiazide  25mg  daily

## 2023-05-11 NOTE — Assessment & Plan Note (Signed)
-   chronic and stable - Continue Atorvastatin  40mg  daily for primary prevention

## 2023-05-11 NOTE — Assessment & Plan Note (Signed)
-   chronic and stable - continue Omeprazole  40mg  daily for now

## 2023-05-12 ENCOUNTER — Other Ambulatory Visit: Payer: Self-pay | Admitting: Internal Medicine

## 2023-05-12 DIAGNOSIS — E1129 Type 2 diabetes mellitus with other diabetic kidney complication: Secondary | ICD-10-CM

## 2023-05-12 LAB — VITAMIN D 25 HYDROXY (VIT D DEFICIENCY, FRACTURES): Vit D, 25-Hydroxy: 27 ng/mL — ABNORMAL LOW (ref 30.0–100.0)

## 2023-05-12 LAB — HEMOGLOBIN A1C
Est. average glucose Bld gHb Est-mCnc: 171 mg/dL
Hgb A1c MFr Bld: 7.6 % — ABNORMAL HIGH (ref 4.8–5.6)

## 2023-05-12 MED ORDER — VITAMIN D 50 MCG (2000 UT) PO TABS: 2000.0000 [IU] | ORAL_TABLET | Freq: Every day | ORAL | 3 refills | Status: DC

## 2023-05-12 MED ORDER — PIOGLITAZONE HCL 30 MG PO TABS: 30.0000 mg | ORAL_TABLET | Freq: Every day | ORAL | 3 refills | Status: DC

## 2023-05-12 NOTE — Progress Notes (Unsigned)
 See lab result note.

## 2023-05-20 ENCOUNTER — Other Ambulatory Visit: Payer: Self-pay | Admitting: Student

## 2023-05-20 DIAGNOSIS — Z1231 Encounter for screening mammogram for malignant neoplasm of breast: Secondary | ICD-10-CM

## 2023-05-25 DIAGNOSIS — S46012A Strain of muscle(s) and tendon(s) of the rotator cuff of left shoulder, initial encounter: Secondary | ICD-10-CM | POA: Diagnosis not present

## 2023-06-03 DIAGNOSIS — H35371 Puckering of macula, right eye: Secondary | ICD-10-CM | POA: Diagnosis not present

## 2023-06-08 ENCOUNTER — Ambulatory Visit
Admission: RE | Admit: 2023-06-08 | Discharge: 2023-06-08 | Disposition: A | Discharge status: Home / Self Care | Source: Ambulatory Visit

## 2023-06-08 ENCOUNTER — Ambulatory Visit

## 2023-06-08 DIAGNOSIS — Z1231 Encounter for screening mammogram for malignant neoplasm of breast: Secondary | ICD-10-CM

## 2023-06-13 ENCOUNTER — Other Ambulatory Visit: Payer: Self-pay

## 2023-06-13 MED ORDER — SYNJARDY 5-1000 MG PO TABS: 1.0000 | ORAL_TABLET | Freq: Two times a day (BID) | ORAL | 3 refills | Status: AC

## 2023-06-14 ENCOUNTER — Ambulatory Visit: Payer: Self-pay | Admitting: Family Medicine

## 2023-06-14 DIAGNOSIS — R928 Other abnormal and inconclusive findings on diagnostic imaging of breast: Secondary | ICD-10-CM

## 2023-06-27 ENCOUNTER — Other Ambulatory Visit: Payer: Self-pay | Admitting: Family Medicine

## 2023-06-27 ENCOUNTER — Ambulatory Visit
Admission: RE | Admit: 2023-06-27 | Discharge: 2023-06-27 | Disposition: A | Discharge status: Home / Self Care | Source: Ambulatory Visit | Attending: Family Medicine

## 2023-06-27 ENCOUNTER — Other Ambulatory Visit: Payer: Self-pay | Admitting: Student

## 2023-06-27 DIAGNOSIS — R928 Other abnormal and inconclusive findings on diagnostic imaging of breast: Secondary | ICD-10-CM

## 2023-06-27 DIAGNOSIS — R921 Mammographic calcification found on diagnostic imaging of breast: Secondary | ICD-10-CM

## 2023-06-29 ENCOUNTER — Ambulatory Visit
Admission: RE | Admit: 2023-06-29 | Discharge: 2023-06-29 | Disposition: A | Discharge status: Home / Self Care | Source: Ambulatory Visit | Attending: Family Medicine | Admitting: Family Medicine

## 2023-06-29 DIAGNOSIS — R921 Mammographic calcification found on diagnostic imaging of breast: Secondary | ICD-10-CM

## 2023-06-29 HISTORY — PX: BREAST BIOPSY: SHX20

## 2023-06-30 LAB — SURGICAL PATHOLOGY

## 2023-07-13 ENCOUNTER — Ambulatory Visit

## 2023-07-18 ENCOUNTER — Encounter: Payer: Self-pay | Admitting: *Deleted

## 2023-07-28 ENCOUNTER — Encounter: Payer: Self-pay | Admitting: Podiatry

## 2023-07-28 ENCOUNTER — Ambulatory Visit (INDEPENDENT_AMBULATORY_CARE_PROVIDER_SITE_OTHER): Admitting: Podiatry

## 2023-07-28 DIAGNOSIS — E1129 Type 2 diabetes mellitus with other diabetic kidney complication: Secondary | ICD-10-CM

## 2023-07-28 DIAGNOSIS — B351 Tinea unguium: Secondary | ICD-10-CM | POA: Diagnosis not present

## 2023-07-28 DIAGNOSIS — M79675 Pain in left toe(s): Secondary | ICD-10-CM

## 2023-07-28 DIAGNOSIS — M79674 Pain in right toe(s): Secondary | ICD-10-CM | POA: Diagnosis not present

## 2023-07-28 DIAGNOSIS — R809 Proteinuria, unspecified: Secondary | ICD-10-CM

## 2023-07-28 NOTE — Progress Notes (Signed)
 This patient returns to my office for at risk foot care.  This patient requires this care by a professional since this patient will be at risk due to having type 2 diabetes. This patient is unable to cut nails herself since the patient cannot reach her nails.These nails are painful walking and wearing shoes.  This patient presents for at risk foot care today. She presents to the office with a sign interpreter.  General Appearance  Alert, conversant and in no acute stress.  Vascular  Dorsalis pedis and posterior tibial  pulses are palpable  bilaterally.  Capillary return is within normal limits  bilaterally. Temperature is within normal limits  bilaterally.  Neurologic  Senn-Weinstein monofilament wire test within normal limits  bilaterally. Muscle power within normal limits bilaterally.  Nails Thick disfigured discolored nails with subungual debris  hallux nails bilaterally. The left hallux nail is unattached to nail bed.  No evidence of bacterial infection or drainage bilaterally.  Orthopedic  No limitations of motion  feet .  No crepitus or effusions noted.  No bony pathology or digital deformities noted.  Skin  normotropic skin with no porokeratosis noted bilaterally.  No signs of infections or ulcers noted.     Onychomycosis  Pain in right toes  Pain in left toes  Consent was obtained for treatment procedures.   Mechanical debridement of nails 1-5  bilaterally performed with a nail nipper.  Filed with dremel without incident.    Return office visit    3 months                  Told patient to return for periodic foot care and evaluation due to potential at risk complications.   Ruffin Cotton DPM

## 2023-08-01 ENCOUNTER — Emergency Department (HOSPITAL_COMMUNITY)
Admission: EM | Admit: 2023-08-01 | Discharge: 2023-08-02 | Disposition: A | Discharge status: Home / Self Care | Attending: Emergency Medicine | Admitting: Emergency Medicine

## 2023-08-01 ENCOUNTER — Encounter (HOSPITAL_COMMUNITY): Payer: Self-pay

## 2023-08-01 DIAGNOSIS — I10 Essential (primary) hypertension: Secondary | ICD-10-CM | POA: Diagnosis not present

## 2023-08-01 DIAGNOSIS — R42 Dizziness and giddiness: Secondary | ICD-10-CM | POA: Diagnosis not present

## 2023-08-01 DIAGNOSIS — Z79899 Other long term (current) drug therapy: Secondary | ICD-10-CM | POA: Insufficient documentation

## 2023-08-01 DIAGNOSIS — E871 Hypo-osmolality and hyponatremia: Secondary | ICD-10-CM | POA: Diagnosis not present

## 2023-08-01 DIAGNOSIS — Z7982 Long term (current) use of aspirin: Secondary | ICD-10-CM | POA: Insufficient documentation

## 2023-08-01 DIAGNOSIS — H81399 Other peripheral vertigo, unspecified ear: Secondary | ICD-10-CM | POA: Insufficient documentation

## 2023-08-01 DIAGNOSIS — R079 Chest pain, unspecified: Secondary | ICD-10-CM | POA: Insufficient documentation

## 2023-08-01 DIAGNOSIS — R1013 Epigastric pain: Secondary | ICD-10-CM | POA: Insufficient documentation

## 2023-08-01 DIAGNOSIS — Z7984 Long term (current) use of oral hypoglycemic drugs: Secondary | ICD-10-CM | POA: Diagnosis not present

## 2023-08-01 DIAGNOSIS — E876 Hypokalemia: Secondary | ICD-10-CM | POA: Insufficient documentation

## 2023-08-01 DIAGNOSIS — R109 Unspecified abdominal pain: Secondary | ICD-10-CM | POA: Diagnosis not present

## 2023-08-01 DIAGNOSIS — E119 Type 2 diabetes mellitus without complications: Secondary | ICD-10-CM | POA: Diagnosis not present

## 2023-08-01 LAB — CBC
HCT: 46.3 % — ABNORMAL HIGH (ref 36.0–46.0)
Hemoglobin: 14.3 g/dL (ref 12.0–15.0)
MCH: 30.7 pg (ref 26.0–34.0)
MCHC: 30.9 g/dL (ref 30.0–36.0)
MCV: 99.4 fL (ref 80.0–100.0)
Platelets: 223 K/uL (ref 150–400)
RBC: 4.66 MIL/uL (ref 3.87–5.11)
RDW: 12.8 % (ref 11.5–15.5)
WBC: 9.1 K/uL (ref 4.0–10.5)
nRBC: 0 % (ref 0.0–0.2)

## 2023-08-01 LAB — COMPREHENSIVE METABOLIC PANEL WITH GFR
ALT: 24 U/L (ref 0–44)
AST: 27 U/L (ref 15–41)
Albumin: 4.6 g/dL (ref 3.5–5.0)
Alkaline Phosphatase: 68 U/L (ref 38–126)
Anion gap: 11 (ref 5–15)
BUN: 9 mg/dL (ref 8–23)
CO2: 26 mmol/L (ref 22–32)
Calcium: 9 mg/dL (ref 8.9–10.3)
Chloride: 92 mmol/L — ABNORMAL LOW (ref 98–111)
Creatinine, Ser: 0.67 mg/dL (ref 0.44–1.00)
GFR, Estimated: >60 mL/min (ref >60)
Glucose, Bld: 103 mg/dL — ABNORMAL HIGH (ref 70–99)
Potassium: 2.8 mmol/L — ABNORMAL LOW (ref 3.5–5.1)
Sodium: 129 mmol/L — ABNORMAL LOW (ref 135–145)
Total Bilirubin: 1.7 mg/dL — ABNORMAL HIGH (ref 0.0–1.2)
Total Protein: 7.9 g/dL (ref 6.5–8.1)

## 2023-08-01 LAB — URINALYSIS, ROUTINE W REFLEX MICROSCOPIC
Bacteria, UA: NONE SEEN
Bilirubin Urine: NEGATIVE
Glucose, UA: >=500 mg/dL — AB
Hgb urine dipstick: NEGATIVE
Ketones, ur: NEGATIVE mg/dL
Leukocytes,Ua: NEGATIVE
Nitrite: NEGATIVE
Protein, ur: NEGATIVE mg/dL
Specific Gravity, Urine: 1 — ABNORMAL LOW (ref 1.005–1.030)
pH: 7 (ref 5.0–8.0)

## 2023-08-01 LAB — LIPASE, BLOOD: Lipase: 33 U/L (ref 11–51)

## 2023-08-01 MED ORDER — ONDANSETRON 4 MG PO TBDP
4.0000 mg | ORAL_TABLET | Freq: Once | ORAL | Status: AC | PRN
  Administered 2023-08-01: 4 mg via ORAL
  Filled 2023-08-01: qty 1

## 2023-08-01 NOTE — ED Triage Notes (Signed)
 Patient arrived with complaints of left sided facial pain, generalized abdominal pain, dizziness, NV that started roughly an hour and a half ago.   Interpreter Alan [phone] used for triage.

## 2023-08-02 ENCOUNTER — Encounter (HOSPITAL_COMMUNITY): Payer: Self-pay

## 2023-08-02 ENCOUNTER — Emergency Department (HOSPITAL_COMMUNITY)

## 2023-08-02 DIAGNOSIS — R109 Unspecified abdominal pain: Secondary | ICD-10-CM | POA: Diagnosis not present

## 2023-08-02 DIAGNOSIS — H81399 Other peripheral vertigo, unspecified ear: Secondary | ICD-10-CM | POA: Diagnosis not present

## 2023-08-02 LAB — MAGNESIUM: Magnesium: 1.7 mg/dL (ref 1.7–2.4)

## 2023-08-02 LAB — TROPONIN I (HIGH SENSITIVITY): Troponin I (High Sensitivity): 4 ng/L (ref <18)

## 2023-08-02 MED ORDER — IOHEXOL 300 MG/ML  SOLN
100.0000 mL | Freq: Once | INTRAMUSCULAR | Status: AC | PRN
  Administered 2023-08-02: 100 mL via INTRAVENOUS

## 2023-08-02 MED ORDER — PROCHLORPERAZINE EDISYLATE 10 MG/2ML IJ SOLN
10.0000 mg | Freq: Once | INTRAMUSCULAR | Status: AC
  Administered 2023-08-02: 10 mg via INTRAVENOUS
  Filled 2023-08-02: qty 2

## 2023-08-02 MED ORDER — PANTOPRAZOLE SODIUM 40 MG PO TBEC: 40.0000 mg | DELAYED_RELEASE_TABLET | Freq: Every day | ORAL | 0 refills | Status: DC

## 2023-08-02 MED ORDER — MAGNESIUM SULFATE 2 GM/50ML IV SOLN
2.0000 g | Freq: Once | INTRAVENOUS | Status: AC
  Administered 2023-08-02: 2 g via INTRAVENOUS
  Filled 2023-08-02: qty 50

## 2023-08-02 MED ORDER — POTASSIUM CHLORIDE 10 MEQ/100ML IV SOLN
10.0000 meq | INTRAVENOUS | Status: AC
  Administered 2023-08-02 (×2): 10 meq via INTRAVENOUS
  Filled 2023-08-02 (×2): qty 100

## 2023-08-02 MED ORDER — POTASSIUM CHLORIDE CRYS ER 20 MEQ PO TBCR: 20.0000 meq | EXTENDED_RELEASE_TABLET | Freq: Two times a day (BID) | ORAL | 0 refills | Status: DC

## 2023-08-02 MED ORDER — MECLIZINE HCL 25 MG PO TABS: 25.0000 mg | ORAL_TABLET | Freq: Three times a day (TID) | ORAL | 0 refills | Status: DC | PRN

## 2023-08-02 MED ORDER — PANTOPRAZOLE SODIUM 40 MG IV SOLR
40.0000 mg | Freq: Once | INTRAVENOUS | Status: AC
  Administered 2023-08-02: 40 mg via INTRAVENOUS
  Filled 2023-08-02: qty 10

## 2023-08-02 MED ORDER — POTASSIUM CHLORIDE CRYS ER 20 MEQ PO TBCR
40.0000 meq | EXTENDED_RELEASE_TABLET | Freq: Once | ORAL | Status: AC
  Administered 2023-08-02: 40 meq via ORAL
  Filled 2023-08-02: qty 2

## 2023-08-02 MED ORDER — MECLIZINE HCL 25 MG PO TABS
25.0000 mg | ORAL_TABLET | Freq: Once | ORAL | Status: AC
  Administered 2023-08-02: 25 mg via ORAL
  Filled 2023-08-02: qty 1

## 2023-08-02 MED ORDER — PROCHLORPERAZINE MALEATE 10 MG PO TABS: 10.0000 mg | ORAL_TABLET | Freq: Four times a day (QID) | ORAL | 0 refills | Status: AC | PRN

## 2023-08-02 NOTE — ED Provider Notes (Signed)
 [place] EMERGENCY DEPARTMENT AT [facility] Provider Note   CSN: [id number] Arrival date & time: 08/01/23  2232     Patient presents with: Abdominal Pain and Dizziness   Janice Massey is a 68 y.o. female.   The history is provided by the patient. A language interpreter was used Norene #[id number], [place] [phone]).  Abdominal Pain Dizziness  She has history of hypertension, diabetes, deafness and complains of dizziness with associated nausea and left-sided facial pain and abdominal pain which started this evening.  Dizziness is described as a spinning sensation.  She is also complaining of some chest pain.  She notes that she is off balance when she walks.  She has not had similar problems before.    Prior to Admission medications   Medication Sig Start Date End Date Taking? Authorizing Provider  Accu-Chek FastClix Lancets MISC 1 Units by Does not apply route daily. Use once a daily in morning to check blood glucose 01/16/21   Lemon Raisin, MD  aspirin  81 MG chewable tablet Chew 1 tablet (81 mg total) by mouth daily. 12/22/22 12/22/23  Lovie Clarity, MD  atorvastatin  (LIPITOR) 40 MG tablet Take 1 tablet (40 mg total) by mouth at bedtime. 10/13/22 10/13/23  Lovie Clarity, MD  bisacodyl  (DULCOLAX) 10 MG suppository Place 1 suppository (10 mg total) rectally as needed for moderate constipation. 12/22/22   Lovie Clarity, MD  Blood Glucose Monitoring Suppl (ACCU-CHEK GUIDE) w/Device KIT 1 Units by Does not apply route daily. 01/16/21   Lemon Raisin, MD  cetirizine  (ZYRTEC ) 10 MG tablet Take 1 tablet (10 mg total) by mouth daily. 03/01/23   Machen, Julie, MD  Cholecalciferol (VITAMIN D ) 50 MCG (2000 UT) tablet Take 1 tablet (2,000 Units total) by mouth daily. 05/12/23 05/11/24  Lovie Clarity, MD  dorzolamide-timolol (COSOPT) 2-0.5 % ophthalmic solution SMARTSIG:In Eye(s) 02/19/22   [provider]  Empagliflozin -metFORMIN  HCl (SYNJARDY ) 05-998 MG TABS Take 1 tablet by mouth in  the morning and at bedtime. 06/13/23 06/12/24  Lovie Clarity, MD  glucose blood (ACCU-CHEK GUIDE) test strip Use as instructed 01/16/21   Lemon Raisin, MD  ketorolac  (ACULAR ) 0.5 % ophthalmic solution SMARTSIG:In Eye(s) 06/30/22   [provider]  latanoprost  (XALATAN ) 0.005 % ophthalmic solution INSTILL 1 DROP IN BOTH EYES EVERY DAY IN THE EVENING 10/06/20   [name], [name], MD  losartan -hydrochlorothiazide  (HYZAAR) 100-25 MG tablet Take 1 tablet by mouth daily. 05/11/23   Lovie Clarity, MD  omeprazole  (PRILOSEC) 40 MG capsule Take 1 capsule (40 mg total) by mouth daily. 10/13/22 10/13/23  Lovie Clarity, MD  pioglitazone  (ACTOS ) 30 MG tablet Take 1 tablet (30 mg total) by mouth daily. 05/12/23 05/11/24  Lovie Clarity, MD  polyethylene glycol (MIRALAX ) 17 g packet Take 17 g by mouth 2 (two) times daily. 12/22/22   Lovie Clarity, MD  prednisoLONE acetate (PRED FORTE) 1 % ophthalmic suspension SMARTSIG:In Eye(s) 06/30/22   [provider]  Semaglutide  (RYBELSUS ) 14 MG TABS Take 1 tablet (14 mg total) by mouth daily. 05/11/23 05/10/24  Lovie Clarity, MD  senna-docusate (SENOKOT-S) 8.6-50 MG tablet Take 2 tablets by mouth 2 (two) times daily. 12/22/22   Lovie Clarity, MD    Allergies: Codeine, Lisinopril , and Penicillins    Review of Systems  Gastrointestinal:  Positive for abdominal pain.  Neurological:  Positive for dizziness.  All other systems reviewed and are negative.   Updated Vital Signs BP (!) 158/96   Pulse 88   SpO2 99%   Physical Exam Vitals  and nursing note reviewed.   68 year old female, resting comfortably and in no acute distress. Vital signs are significant for elevated blood pressure. Oxygen saturation is 99%, which is normal. Head is normocephalic and atraumatic. PERRLA, EOMI.  Neck is nontender and supple. Lungs are clear without rales, wheezes, or rhonchi. Chest is nontender. Heart has regular rate and rhythm with 2/6 systolic ejection murmur best heard at the cardiac  base. Abdomen is soft, flat, with moderate epigastric tenderness.  There is no rebound or guarding. Extremities have no cyanosis or edema, full range of motion is present. Skin is warm and dry without rash. Neurologic: Awake and alert and oriented, cranial nerves are intact except for hearing.  There there are about 3-5 beats of lateral nystagmus on lateral gaze bilaterally, no rotary nystagmus.  Dizziness is reproduced by passive head movement but not by extraocular movement testing.  Strength is 5/5 in all 4 extremities.  (all labs ordered are listed, but only abnormal results are displayed) Labs Reviewed  COMPREHENSIVE METABOLIC PANEL WITH GFR - Abnormal; Notable for the following components:      Result Value   Sodium 129 (*)    Potassium 2.8 (*)    Chloride 92 (*)    Glucose, Bld 103 (*)    Total Bilirubin 1.7 (*)    All other components within normal limits  CBC - Abnormal; Notable for the following components:   HCT 46.3 (*)    All other components within normal limits  URINALYSIS, ROUTINE W REFLEX MICROSCOPIC - Abnormal; Notable for the following components:   Color, Urine COLORLESS (*)    Specific Gravity, Urine 1.000 (*)    Glucose, UA >=500 (*)    All other components within normal limits  LIPASE, BLOOD  MAGNESIUM   TROPONIN I (HIGH SENSITIVITY)    EKG: EKG Interpretation Date/Time:  Monday August 01 2023 22:57:37 EDT Ventricular Rate:  112 PR Interval:  168 QRS Duration:  111 QT Interval:  381 QTC Calculation: 453 R Axis:   60  Text Interpretation: Sinus tachycardia Supraventricular bigeminy Probable anterolateral infarct, old When compared with ECG of 03/01/2023, Premature atrial complexes are now present Confirmed by Raford Lenis ([id number]) on 08/01/2023 11:00:46 PM  Radiology: CT ABDOMEN PELVIS W CONTRAST Result Date: 08/02/2023 CLINICAL DATA:  68 year old female with acute abdominal pain onset 2100 hours. Left side pain. EXAM: CT ABDOMEN AND PELVIS WITH CONTRAST  TECHNIQUE: Multidetector CT imaging of the abdomen and pelvis was performed using the standard protocol following bolus administration of intravenous contrast. RADIATION DOSE REDUCTION: This exam was performed according to the departmental dose-optimization program which includes automated exposure control, adjustment of the mA and/or kV according to patient size and/or use of iterative reconstruction technique. CONTRAST:  OMNIPAQUE  IOHEXOL  300 MG/ML  SOLN COMPARISON:  CT Abdomen and Pelvis 01/06/2022 and earlier. FINDINGS: Lower chest: Stable mild lung base atelectasis. No pericardial or pleural effusion. Stable heart size at the upper limits of normal. Hepatobiliary: Chronic cholecystectomy. Liver appears stable and negative. Pancreas: Stable and negative. Spleen: Stable and negative. Adrenals/Urinary Tract: Normal adrenal glands. Nonobstructed kidneys demonstrate symmetric enhancement and contrast excretion. Diminutive ureters. No nephrolithiasis or renal inflammation. Stable appearance of the urinary bladder, numerous pelvic phleboliths. Stomach/Bowel: Decompressed large bowel from the splenic flexure through the distal sigmoid colon. The descending and sigmoid colon appear mildly indistinct, but no mesenteric inflammation is associated. Rectum appears more normal. Transverse colon appears more normal, mildly redundant with retained gas and stool. Ascending colon also partially  decompressed. Normal gas containing and retrocecal appendix on series 2, image 46 and coronal image 51. Decompressed terminal ileum. Nondilated small bowel. Some retained food in gas in the stomach. Trace oral contrast at the gastric fundus. Decompressed duodenum. No pneumoperitoneum, free fluid or mesenteric inflammation identified. Vascular/Lymphatic: Normal caliber abdominal aorta. Major arterial structures in the abdomen and pelvis appear patent. Mild atherosclerosis. Portal venous system appears patent. No lymphadenopathy  identified. Reproductive: Negative. Other: No pelvis free fluid. 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 Bulky thoracolumbar junction and lumbar endplate osteophytes. Developing lumbosacral junction interbody ankylosis. Diffuse lumbar vacuum disc. No acute osseous abnormality identified. IMPRESSION: 1. Decompressed descending and sigmoid colon. A mild colitis would be difficult to exclude but there is no mesenteric inflammation. 2. No other acute or inflammatory process. Otherwise stable abdomen and pelvis. Electronically Signed   By: VEAR Hurst M.D.   On: 08/02/2023 04:22     Procedures  Cardiac monitor shows normal sinus rhythm, per my interpretation. Medications Ordered in the ED  pantoprazole  (PROTONIX ) injection 40 mg (has no administration in time range)  prochlorperazine  (COMPAZINE ) injection 10 mg (has no administration in time range)  ondansetron  (ZOFRAN -ODT) disintegrating tablet 4 mg (4 mg Oral Given 08/01/23 2300)  potassium chloride  10 mEq in 100 mL IVPB (0 mEq Intravenous Stopped 08/02/23 0552)  potassium chloride  SA (KLOR-CON  M) CR tablet 40 mEq (40 mEq Oral Given 08/02/23 0305)  meclizine  (ANTIVERT ) tablet 25 mg (25 mg Oral Given 08/02/23 0305)  iohexol  (OMNIPAQUE ) 300 MG/ML solution 100 mL (100 mLs Intravenous Contrast Given 08/02/23 0408)  magnesium  sulfate IVPB 2 g 50 mL (0 g Intravenous Stopped 08/02/23 0553)                                    Medical Decision Making Amount and/or Complexity of Data Reviewed Labs: ordered. Radiology: ordered.  Risk Prescription drug management.   Dizziness which seems to be peripheral vertigo.  Facial pain of uncertain cause but it is related to her vertigo at least temporarily.  Also chest pain and abdominal pain of uncertain cause.  I have ordered a dose of oral meclizine .  I reviewed her laboratory tests, and my interpretation is mild hyponatremia which is not felt to be clinically significant,  moderate to severe hypokalemia which may be contributing to her symptoms, borderline elevated random glucose which will need to be followed as an outpatient, normal CBC, normal urinalysis.  I have reviewed her electrocardiogram, and my interpretation is sinus tachycardia with atrial bigeminy.  Compared with prior ECG, PACs are new.  However, when I going to of evaluate the patient, cardiac monitor showing sinus rhythm without ectopy.  I have ordered oral meclizine , oral and intravenous potassium.  I have ordered a magnesium  level be checked as well as troponin and I have ordered CT of abdomen and pelvis.  Magnesium  level is normal but in the lower end of normal, troponin is normal.  Since she had already been in the ED several hours when this was drawn, it was felt to be the equivalent of a repeat troponin and no repeat troponin was actually necessary.  CT of abdomen and pelvis showed no acute process.  I have independently viewed the images, and agree with the radiologist's interpretation.  Because the magnesium  was borderline and she had severe hypokalemia, I ordered intravenous magnesium .  Following above-noted treatment, patient states that her dizziness is  much better and nausea is improved but still present.  She is still complaining of epigastric pain I have ordered a dose of pantoprazole  and prochlorperazine .  I am discharging her with prescriptions for pantoprazole , prochlorperazine , meclizine  and oral potassium.  CRITICAL CARE Performed by: Alm Lias Total critical care time: 50 minutes Critical care time was exclusive of separately billable procedures and treating other patients. Critical care was necessary to treat or prevent imminent or life-threatening deterioration. Critical care was time spent personally by me on the following activities: development of treatment plan with patient and/or surrogate as well as nursing, discussions with consultants, evaluation of patient's response to  treatment, examination of patient, obtaining history from patient or surrogate, ordering and performing treatments and interventions, ordering and review of laboratory studies, ordering and review of radiographic studies, pulse oximetry and re-evaluation of patient's condition.     Final diagnoses:  Peripheral vertigo, unspecified laterality  Epigastric pain  Hypokalemia  Hyponatremia    ED Discharge Orders          Ordered    pantoprazole  (PROTONIX ) 40 MG tablet  Daily        08/02/23 0631    prochlorperazine  (COMPAZINE ) 10 MG tablet  Every 6 hours PRN        08/02/23 0631    meclizine  (ANTIVERT ) 25 MG tablet  3 times daily PRN        08/02/23 0631    potassium chloride  SA (KLOR-CON  M) 20 MEQ tablet  2 times daily        08/02/23 0631               Lias Alm, MD 08/02/23 [phone]

## 2023-08-02 NOTE — Discharge Instructions (Addendum)
 You may take an antacid such as Maalox, Mylanta, Pepto-Bismol as needed.  Return to the emergency department if symptoms or not being adequately controlled at home.

## 2023-08-03 ENCOUNTER — Telehealth: Payer: Self-pay

## 2023-08-03 NOTE — Telephone Encounter (Signed)
 NO message documented in the chart. Unclear who contacted this patient.  Copied from CRM [id number]. Topic: General - Call Back - No Documentation >> Aug 03, 2023  9:44 AM Mercer PEDLAR wrote: Reason for CRM: Patient returning call she received today 08/03/23, there was no message left for her.

## 2023-08-09 ENCOUNTER — Other Ambulatory Visit: Payer: Self-pay

## 2023-08-09 ENCOUNTER — Emergency Department (HOSPITAL_COMMUNITY)

## 2023-08-09 ENCOUNTER — Emergency Department (HOSPITAL_COMMUNITY)
Admission: EM | Admit: 2023-08-09 | Discharge: 2023-08-10 | Disposition: A | Discharge status: Home / Self Care | Attending: Emergency Medicine | Admitting: Emergency Medicine

## 2023-08-09 DIAGNOSIS — R079 Chest pain, unspecified: Secondary | ICD-10-CM | POA: Diagnosis present

## 2023-08-09 DIAGNOSIS — I10 Essential (primary) hypertension: Secondary | ICD-10-CM | POA: Diagnosis not present

## 2023-08-09 LAB — BASIC METABOLIC PANEL WITH GFR
Anion gap: 10 (ref 5–15)
BUN: 15 mg/dL (ref 8–23)
CO2: 23 mmol/L (ref 22–32)
Calcium: 9.1 mg/dL (ref 8.9–10.3)
Chloride: 105 mmol/L (ref 98–111)
Creatinine, Ser: 0.74 mg/dL (ref 0.44–1.00)
GFR, Estimated: >60 mL/min (ref >60)
Glucose, Bld: 100 mg/dL — ABNORMAL HIGH (ref 70–99)
Potassium: 3.5 mmol/L (ref 3.5–5.1)
Sodium: 138 mmol/L (ref 135–145)

## 2023-08-09 LAB — CBC
HCT: 35.7 % — ABNORMAL LOW (ref 36.0–46.0)
Hemoglobin: 12 g/dL (ref 12.0–15.0)
MCH: 31.3 pg (ref 26.0–34.0)
MCHC: 33.6 g/dL (ref 30.0–36.0)
MCV: 93 fL (ref 80.0–100.0)
Platelets: 249 K/uL (ref 150–400)
RBC: 3.84 MIL/uL — ABNORMAL LOW (ref 3.87–5.11)
RDW: 12.4 % (ref 11.5–15.5)
WBC: 7.7 K/uL (ref 4.0–10.5)
nRBC: 0 % (ref 0.0–0.2)

## 2023-08-09 LAB — BRAIN NATRIURETIC PEPTIDE: B Natriuretic Peptide: 43.5 pg/mL (ref 0.0–100.0)

## 2023-08-09 LAB — TROPONIN I (HIGH SENSITIVITY): Troponin I (High Sensitivity): 4 ng/L (ref <18)

## 2023-08-09 NOTE — ED Triage Notes (Signed)
 Patient reports central chest pain this morning , no emesis or diaphoresis , denies cough or fever .

## 2023-08-10 ENCOUNTER — Ambulatory Visit

## 2023-08-10 VITALS — BP 130/76 | HR 81 | Temp 98.3°F | Ht 67.0 in | Wt 158.2 lb

## 2023-08-10 DIAGNOSIS — R809 Proteinuria, unspecified: Secondary | ICD-10-CM

## 2023-08-10 DIAGNOSIS — Z7984 Long term (current) use of oral hypoglycemic drugs: Secondary | ICD-10-CM | POA: Diagnosis not present

## 2023-08-10 DIAGNOSIS — E876 Hypokalemia: Secondary | ICD-10-CM

## 2023-08-10 DIAGNOSIS — J302 Other seasonal allergic rhinitis: Secondary | ICD-10-CM | POA: Diagnosis not present

## 2023-08-10 DIAGNOSIS — E559 Vitamin D deficiency, unspecified: Secondary | ICD-10-CM

## 2023-08-10 DIAGNOSIS — E1129 Type 2 diabetes mellitus with other diabetic kidney complication: Secondary | ICD-10-CM | POA: Diagnosis not present

## 2023-08-10 DIAGNOSIS — K219 Gastro-esophageal reflux disease without esophagitis: Secondary | ICD-10-CM | POA: Diagnosis not present

## 2023-08-10 DIAGNOSIS — Z7985 Long-term (current) use of injectable non-insulin antidiabetic drugs: Secondary | ICD-10-CM

## 2023-08-10 DIAGNOSIS — Z7982 Long term (current) use of aspirin: Secondary | ICD-10-CM

## 2023-08-10 DIAGNOSIS — Z0000 Encounter for general adult medical examination without abnormal findings: Secondary | ICD-10-CM | POA: Diagnosis not present

## 2023-08-10 DIAGNOSIS — I10 Essential (primary) hypertension: Secondary | ICD-10-CM | POA: Diagnosis not present

## 2023-08-10 DIAGNOSIS — E785 Hyperlipidemia, unspecified: Secondary | ICD-10-CM

## 2023-08-10 DIAGNOSIS — R079 Chest pain, unspecified: Secondary | ICD-10-CM | POA: Diagnosis not present

## 2023-08-10 LAB — POCT GLYCOSYLATED HEMOGLOBIN (HGB A1C): Hemoglobin A1C: 7 % — AB (ref 4.0–5.6)

## 2023-08-10 LAB — GLUCOSE, CAPILLARY: Glucose-Capillary: 102 mg/dL — ABNORMAL HIGH (ref 70–99)

## 2023-08-10 LAB — TROPONIN I (HIGH SENSITIVITY): Troponin I (High Sensitivity): 4 ng/L (ref <18)

## 2023-08-10 MED ORDER — CETIRIZINE HCL 10 MG PO TABS
10.0000 mg | ORAL_TABLET | Freq: Every day | ORAL | 1 refills | Status: AC
Start: 2023-08-10 — End: ?

## 2023-08-10 MED ORDER — RYBELSUS 14 MG PO TABS: 14.0000 mg | ORAL_TABLET | Freq: Every day | ORAL | 3 refills | Status: AC

## 2023-08-10 MED ORDER — VITAMIN D3 10 MCG (400 UNIT) PO TABS
800.0000 [IU] | ORAL_TABLET | Freq: Every day | ORAL | 3 refills | Status: AC
Start: 2023-08-10 — End: ?

## 2023-08-10 MED ORDER — OMEPRAZOLE 20 MG PO CPDR: 20.0000 mg | DELAYED_RELEASE_CAPSULE | Freq: Every day | ORAL | 0 refills | Status: AC

## 2023-08-10 MED ORDER — ALUM & MAG HYDROXIDE-SIMETH 200-200-20 MG/5ML PO SUSP
30.0000 mL | Freq: Once | ORAL | Status: AC
  Administered 2023-08-10: 30 mL via ORAL
  Filled 2023-08-10: qty 30

## 2023-08-10 MED ORDER — FAMOTIDINE 20 MG PO TABS
20.0000 mg | ORAL_TABLET | Freq: Once | ORAL | Status: AC
  Administered 2023-08-10: 20 mg via ORAL
  Filled 2023-08-10: qty 1

## 2023-08-10 NOTE — Progress Notes (Signed)
 Annual Wellness Visit     Patient: Janice Massey, Female    DOB: December 08, 1955, 68 y.o.   MRN: [id number]  Subjective  Chief Complaint  Patient presents with   Medical Management of Chronic Issues    ED f/u - feeling better today.    Adriauna Campton is a 68 y.o. female who presents today for her Annual Wellness Visit.  HPI  In-person ASL interpreter was used during this interview.   No acute complaints. Doing well. Recently presented to ED on 7/28 d/t dizziness, nausea found to be hyponatremia, hypokalemic with symptomatic improvement w/ IV mag, K supplementation and antiemetics. CTAP with no acute process. She was discharged home on potassium supplements and meclizine  as there was suspicion for some component of peripheral vertigo to her dizziness.  Since dc, she has been doing well without recurrence of sxs. She denies any dizziness, lightheadedness, weakness, abdominal pain, nausea. She has been using meclizine  ~2x/week and has been adherent to K supplementation.    Medications: Outpatient Medications Prior to Visit  Medication Sig   Accu-Chek FastClix Lancets MISC 1 Units by Does not apply route daily. Use once a daily in morning to check blood glucose   aspirin  81 MG chewable tablet Chew 1 tablet (81 mg total) by mouth daily.   atorvastatin  (LIPITOR) 40 MG tablet Take 1 tablet (40 mg total) by mouth at bedtime.   bisacodyl  (DULCOLAX) 10 MG suppository Place 1 suppository (10 mg total) rectally as needed for moderate constipation.   Blood Glucose Monitoring Suppl (ACCU-CHEK GUIDE) w/Device KIT 1 Units by Does not apply route daily.   cetirizine  (ZYRTEC ) 10 MG tablet Take 1 tablet (10 mg total) by mouth daily.   Cholecalciferol (VITAMIN D ) 50 MCG (2000 UT) tablet Take 1 tablet (2,000 Units total) by mouth daily.   dorzolamide-timolol (COSOPT) 2-0.5 % ophthalmic solution SMARTSIG:In Eye(s)   Empagliflozin -metFORMIN  HCl (SYNJARDY ) 05-998 MG TABS Take 1 tablet by mouth in the  morning and at bedtime.   glucose blood (ACCU-CHEK GUIDE) test strip Use as instructed   ketorolac  (ACULAR ) 0.5 % ophthalmic solution SMARTSIG:In Eye(s)   latanoprost  (XALATAN ) 0.005 % ophthalmic solution INSTILL 1 DROP IN BOTH EYES EVERY DAY IN THE EVENING   losartan -hydrochlorothiazide  (HYZAAR) 100-25 MG tablet Take 1 tablet by mouth daily.   meclizine  (ANTIVERT ) 25 MG tablet Take 1 tablet (25 mg total) by mouth 3 (three) times daily as needed for dizziness.   omeprazole  (PRILOSEC) 40 MG capsule Take 1 capsule (40 mg total) by mouth daily.   pantoprazole  (PROTONIX ) 40 MG tablet Take 1 tablet (40 mg total) by mouth daily.   pioglitazone  (ACTOS ) 30 MG tablet Take 1 tablet (30 mg total) by mouth daily.   polyethylene glycol (MIRALAX ) 17 g packet Take 17 g by mouth 2 (two) times daily.   potassium chloride  SA (KLOR-CON  M) 20 MEQ tablet Take 1 tablet (20 mEq total) by mouth 2 (two) times daily.   prednisoLONE acetate (PRED FORTE) 1 % ophthalmic suspension SMARTSIG:In Eye(s)   prochlorperazine  (COMPAZINE ) 10 MG tablet Take 1 tablet (10 mg total) by mouth every 6 (six) hours as needed for nausea or vomiting.   Semaglutide  (RYBELSUS ) 14 MG TABS Take 1 tablet (14 mg total) by mouth daily.   senna-docusate (SENOKOT-S) 8.6-50 MG tablet Take 2 tablets by mouth 2 (two) times daily.   No facility-administered medications prior to visit.    Allergies  Allergen Reactions   Codeine Itching   Lisinopril  Cough   Penicillins  Itching    Has patient had a PCN reaction causing immediate rash, facial/tongue/throat swelling, SOB or lightheadedness with hypotension: YES Has patient had a PCN reaction causing severe rash involving mucus membranes or skin necrosis: NO Has patient had a PCN reaction that required hospitalization NO Has patient had a PCN reaction occurring within the last 10 years: NO If all of the above answers are NO, then may proceed with Cephalosporin use.    Patient Care Team: Shawn Sick, MD as PCP - General Delford Maude BROCKS, MD as PCP - Cardiology (Cardiology) Maree Lonni Inks, MD as Consulting Physician (Ophthalmology)  Review of Systems  Constitutional:  Negative for fever.  Cardiovascular:  Negative for chest pain and leg swelling.  Gastrointestinal:  Negative for nausea.  Musculoskeletal:  Negative for falls.  Neurological:  Negative for dizziness, weakness and headaches.        Objective  BP 130/76 (BP Location: Right Arm, Patient Position: Sitting, Cuff Size: Normal)   Pulse 81   Temp 98.3 F (36.8 C) (Oral)   Ht 5' 7 (1.702 m)   Wt 158 lb 3.2 oz (71.8 kg)   SpO2 98% Comment: RA  BMI 24.78 kg/m   Physical Exam GEN: Well appearing, NAD. Oriented x 3, normal mood and affect  HEENT: Normocephalic, atraumatic, Conjunctiva clear, sclera non-icteric, EOM intact, no cervical LAD CV: RRR,+systolic murmur PULM: CTAB MSK: b/l LE wnl NEURO:  moving all extremities spontaneously PSYCH: Oriented X3, intact recent and remote memory, judgment and insight, normal mood and affect.   Most recent functional status assessment:    10/20/2022    9:47 AM  In your present state of health, do you have any difficulty performing the following activities:  Hearing? 0  Comment patient is deaf  Vision? 0  Difficulty concentrating or making decisions? 0  Walking or climbing stairs? 0  Dressing or bathing? 0  Doing errands, shopping? 1   Most recent fall risk assessment:    08/10/2023   10:27 AM  Fall Risk   Falls in the past year? 1  Number falls in past yr: 1  Injury with Fall? 0  Risk for fall due to : Other (Comment)  Risk for fall due to: Comment tanled up w/son's dog.     Most recent depression screenings:    08/10/2023   10:27 AM 10/20/2022    9:44 AM  PHQ 2/9 Scores  PHQ - 2 Score 0 0   Most recent cognitive screening:     No data to display         Most recent Audit-C alcohol use screening    01/20/2022   12:05 PM  Alcohol Use  Disorder Test (AUDIT)  1. How often do you have a drink containing alcohol? 1  2. How many drinks containing alcohol do you have on a typical day when you are drinking? 0  3. How often do you have six or more drinks on one occasion? 0  AUDIT-C Score 1   A score of 3 or more in women, and 4 or more in men indicates increased risk for alcohol abuse, EXCEPT if all of the points are from question 1   Vision/Hearing Screen: No results found.   Results for orders placed or performed in visit on 08/10/23  Glucose, capillary  Result Value Ref Range   Glucose-Capillary 102 (H) 70 - 99 mg/dL  POC Hbg J8R  Result Value Ref Range   Hemoglobin A1C 7.0 (A) 4.0 - 5.6 %  HbA1c POC (<> result, manual entry)     HbA1c, POC (prediabetic range)     HbA1c, POC (controlled diabetic range)        Assessment & Plan   Annual wellness visit done today including the all of the following: Reviewed patient's Family Medical History Reviewed and updated list of patient's medical providers Assessment of cognitive impairment was done Assessed patient's functional ability Established a written schedule for health screening services Health Risk Assessent Completed and Reviewed  Exercise Activities and Dietary recommendations  Goals      Blood Pressure < 120/80     HEMOGLOBIN A1C < 7.0     LDL CALC < 100        Immunization History  Administered Date(s) Administered   Fluad Quad(high Dose 65+) 10/12/2021   Fluad Trivalent(High Dose 65+) 09/08/2022   Influenza Split 11/09/2010, 11/09/2011   Influenza Whole 11/18/2006, 10/11/2007, 10/03/2008, 10/07/2009   Influenza,inj,Quad PF,6+ Mos 09/20/2012, 10/10/2013, 11/13/2014, 11/13/2014, 02/09/2016, 10/19/2016, 10/27/2017, 09/22/2018, 09/22/2018, 10/29/2019, 10/06/2020   PFIZER(Purple Top)SARS-COV-2 Vaccination 03/29/2019, 04/23/2019, 11/07/2019   PNEUMOCOCCAL CONJUGATE-20 11/18/2020   Pneumococcal Polysaccharide-23 10/07/2009   Td 11/04/1997, 08/06/2009    Tdap 12/29/2014   Zoster Recombinant(Shingrix) 10/23/2017, 10/23/2020    Health Maintenance  Topic Date Due   Medicare Annual Wellness (AWV)  Never done   DEXA SCAN  Never done   COVID-19 Vaccine (4 - 2024-25 season) 09/05/2022   MAMMOGRAM  07/07/2023   INFLUENZA VACCINE  08/05/2023   FOOT EXAM  09/08/2023   Diabetic kidney evaluation - Urine ACR  10/13/2023   HEMOGLOBIN A1C  11/10/2023   OPHTHALMOLOGY EXAM  01/26/2024   Diabetic kidney evaluation - eGFR measurement  07/31/2024   DTaP/Tdap/Td (4 - Td or Tdap) 12/28/2024   Colonoscopy  06/14/2028   Pneumococcal Vaccine: 50+ Years  Completed   Hepatitis C Screening  Completed   Zoster Vaccines- Shingrix  Completed   Hepatitis B Vaccines  Aged Out   HPV VACCINES  Aged Out   Meningococcal B Vaccine  Aged Out   LIPID PANEL  Discontinued     Discussed health benefits of physical activity, and encouraged her to engage in regular exercise appropriate for her age and condition.   Assessment & Plan Healthcare maintenance Colon cancer: Last colonoscopy 06/2018: wnl, next due 06/2028 (last) Mammo: Last mammo 07/2022: BiRADS 1, mammo ordered 05/2023 DEXA: ordered 5/25.  Vaccines: Last Tdap 12/2014, counseled on Covid/Flu ACP: briefly introduced concept of ACP, recommended designation of HCA. Currently declines to select HCA but will continue to think about it.  Type 2 diabetes mellitus with diabetic microalbuminuria, without long-term current use of insulin  (HCC) A1c today 7% (at goal). Continue Synjardy , Rybelsus , Actos . UTD on UACR, foot exam, eye exam. Seasonal allergies Continue Zyrtec  Hypokalemia Plan to recheck BMP/mag today after which we will discuss discontinuation of K supplementation she was discharged on. Gastroesophageal reflux disease without esophagitis Stable. Continue PPI Hyperlipidemia, unspecified hyperlipidemia type Stable, cont atorvastatin . Essential hypertension Stable, cont  losartan -hydrochlorothiazide  Aspirin  long-term use Unclear why pt is on ASA. Per chart review, it was started 04/2013 by her PCP after presenting with s/s stable angina, subsequent nuclear stress 12/2014 with fixed small, mild apical perfusion defect w/ no WMA suspected to be soft tissue attenuation. No ischemia/infarction, normal LV systolic function/wall motion. She currently does not carry any indication for ASA (no history of CVA, PAD, CAD) and is currently asx from a cardiac perspective and I am inclined to discontinue this medication at our next visit.  Return in about 6 months (around 02/10/2024).     Jone Dauphin, MD

## 2023-08-10 NOTE — Assessment & Plan Note (Signed)
 Stable, cont atorvastatin

## 2023-08-10 NOTE — Assessment & Plan Note (Addendum)
 Unclear why pt is on ASA. Per chart review, it was started 04/2013 by her PCP after presenting with s/s stable angina, subsequent nuclear stress 12/2014 with fixed small, mild apical perfusion defect w/ no WMA suspected to be soft tissue attenuation. No ischemia/infarction, normal LV systolic function/wall motion. She currently does not carry any indication for ASA (no history of CVA, PAD, CAD) and is currently asx from a cardiac perspective and I am inclined to discontinue this medication at our next visit.

## 2023-08-10 NOTE — Assessment & Plan Note (Addendum)
 A1c today 7% (at goal). Continue Synjardy , Rybelsus , Actos . UTD on UACR, foot exam, eye exam.

## 2023-08-10 NOTE — Assessment & Plan Note (Signed)
Stable.  Continue PPI. 

## 2023-08-10 NOTE — Assessment & Plan Note (Signed)
 Stable, cont losartan -hydrochlorothiazide 

## 2023-08-10 NOTE — Patient Instructions (Signed)
 Thank you for coming in today. If you have any questions or concerns, please let us  know!

## 2023-08-10 NOTE — Assessment & Plan Note (Signed)
 Colon cancer: Last colonoscopy 06/2018: wnl, next due 06/2028 (last) Mammo: Last mammo 07/2022: BiRADS 1, mammo ordered 05/2023 DEXA: ordered 5/25.  Vaccines: Last Tdap 12/2014, counseled on Covid/Flu ACP: briefly introduced concept of ACP, recommended designation of HCA. Currently declines to select HCA but will continue to think about it.

## 2023-08-10 NOTE — Discharge Instructions (Addendum)
 Evaluation today was overall reassuring.  Please follow-up with cardiology for your chest pain.  I also sent omeprazole  to your pharmacy as well as this could be reflux.  Please follow-up with your PCP.  If you have worsening chest pain, shortness of breath or any other concerning symptom please return to the ED for further evaluation.

## 2023-08-10 NOTE — ED Provider Notes (Signed)
 [place] EMERGENCY DEPARTMENT AT [facility] Provider Note   CSN: [id number] Arrival date & time: 08/09/23  2138     Patient presents with: Chest Pain  HPI Brandi Mendez is a 68 y.o. female with hypertension, history of SBO presenting for chest pain.  Started yesterday morning while sitting and eating breakfast.  The pain is central.  He has been constant since then but she says overall has improved.  Denies diaphoresis and shortness of breath.  Denies cough and fever.  Denies any radiation of that pain into the abdomen and denies nausea vomiting diarrhea.  Denies calf tenderness or swelling, recent long trips or immobilization, known history of blood clots or malignancy.    Chest Pain      Prior to Admission medications   Medication Sig Start Date End Date Taking? Authorizing Provider  acetaminophen  (TYLENOL ) 500 MG tablet Take 500 mg by mouth every 6 (six) hours as needed.    [provider]  famotidine  (PEPCID ) 20 MG tablet Take 1 tablet (20 mg total) by mouth 2 (two) times daily. 02/16/23   Small, Brooke L, PA  ibuprofen  (ADVIL ) 400 MG tablet Take 1 tablet (400 mg total) by mouth every 6 (six) hours as needed (mild pain, fever >100.4). 05/18/22   Dameron, Marisa, DO  polyethylene glycol (MIRALAX ) 17 g packet Take 17 g by mouth 2 (two) times daily. 05/18/22   Elicia Hamlet, MD  fluticasone  (FLONASE ) 50 MCG/ACT nasal spray Place 2 sprays into both nostrils daily. 01/24/19 08/07/19  Mullis, Kiersten P, DO    Allergies: Patient has no known allergies.    Review of Systems  Cardiovascular:  Positive for chest pain.    Updated Vital Signs BP 114/65 (BP Location: Right Arm)   Pulse 81   Temp 97.9 F (36.6 C) (Oral)   Resp 17   SpO2 100%   Physical Exam Vitals and nursing note reviewed.  HENT:     Head: Normocephalic and atraumatic.     Mouth/Throat:     Mouth: Mucous membranes are moist.  Eyes:     General:        Right eye: No discharge.        Left eye:  No discharge.     Conjunctiva/sclera: Conjunctivae normal.  Cardiovascular:     Rate and Rhythm: Normal rate and regular rhythm.     Pulses: Normal pulses.     Heart sounds: Normal heart sounds.  Pulmonary:     Effort: Pulmonary effort is normal.     Breath sounds: Normal breath sounds.  Abdominal:     General: Abdomen is flat.     Palpations: Abdomen is soft.  Skin:    General: Skin is warm and dry.  Neurological:     General: No focal deficit present.  Psychiatric:        Mood and Affect: Mood normal.     (all labs ordered are listed, but only abnormal results are displayed) Labs Reviewed  BASIC METABOLIC PANEL WITH GFR - Abnormal; Notable for the following components:      Result Value   Glucose, Bld 100 (*)    All other components within normal limits  CBC - Abnormal; Notable for the following components:   RBC 3.84 (*)    HCT 35.7 (*)    All other components within normal limits  BRAIN NATRIURETIC PEPTIDE  TROPONIN I (HIGH SENSITIVITY)  TROPONIN I (HIGH SENSITIVITY)    EKG: EKG Interpretation Date/Time:  Tuesday August 09 2023 21:48:09 EDT Ventricular Rate:  69 PR Interval:  164 QRS Duration:  86 QT Interval:  396 QTC Calculation: 424 R Axis:   86  Text Interpretation: Normal sinus rhythm Normal ECG When compared with ECG of 17-May-2022 03:11, PREVIOUS ECG IS PRESENT Confirmed by Midge Golas ([id number]) on 08/10/2023 12:16:28 AM  Radiology: DG Chest 2 View Result Date: 08/09/2023 EXAM: 2 VIEW(S) XRAY OF THE CHEST 08/09/2023 10:16:00 PM COMPARISON: Comparison with 01/24/2015 CLINICAL HISTORY: Chest pain. Encounter for chest pain FINDINGS: LUNGS AND PLEURA: No focal pulmonary opacity. No pulmonary edema. No pleural effusion. No pneumothorax. HEART AND MEDIASTINUM: No acute abnormality of the cardiac and mediastinal silhouettes. BONES AND SOFT TISSUES: No acute osseous abnormality. IMPRESSION: 1. No acute cardiopulmonary pathology. Electronically signed by: Norman Gatlin MD 08/09/2023 10:19 PM EDT RP Workstation: [id number]     Procedures   Medications Ordered in the ED  famotidine  (PEPCID ) tablet 20 mg (20 mg Oral Given 08/10/23 0108)  alum & mag hydroxide-simeth (MAALOX/MYLANTA) 200-200-20 MG/5ML suspension 30 mL (30 mLs Oral Given 08/10/23 0108)                HEART Score: 3                    Medical Decision Making Amount and/or Complexity of Data Reviewed Labs: ordered. Radiology: ordered.  Risk OTC drugs.   Initial Impression and Ddx 68 year old well-appearing female present for chest pain.  Exam is unremarkable.  DDx includes ACS, PE, aortic dissection, pneumothorax, other. Patient PMH that increases complexity of ED encounter:  hypertension, history of SBO  Interpretation of Diagnostics - I independent reviewed and interpreted the labs as followed: No acute derangement  - I independently visualized the following imaging with scope of interpretation limited to determining acute life threatening conditions related to emergency care: cxr, which revealed no acute cardiopulmonary process  -I personally reviewed and interpreted EKG which revealed normal sinus rhythm  Patient Reassessment and Ultimate Disposition/Management On reassessment, pain had improved after GI cocktail.  Workup largely reassuring.  Will have her follow-up with cardiology given that her heart score is 3.  Also sent omeprazole  to her pharmacy and advised PCP follow-up as well for what could be reflux.  Patient management required discussion with the following services or consulting groups:  None  Complexity of Problems Addressed Acute complicated illness or Injury  Additional Data Reviewed and Analyzed Further history obtained from: Past medical history and medications listed in the EMR and Prior ED visit notes  Patient Encounter Risk Assessment Consideration of hospitalization      Final diagnoses:  Chest pain, unspecified type    ED Discharge  Orders     None          Lang Norleen POUR, PA-C 08/10/23 0148    Midge Golas, MD 08/10/23 [phone]

## 2023-08-11 ENCOUNTER — Encounter: Admitting: Student

## 2023-08-11 ENCOUNTER — Ambulatory Visit: Payer: Self-pay

## 2023-08-11 LAB — BASIC METABOLIC PANEL WITH GFR
BUN/Creatinine Ratio: 11 — ABNORMAL LOW (ref 12–28)
BUN: 9 mg/dL (ref 8–27)
CO2: 24 mmol/L (ref 20–29)
Calcium: 9.6 mg/dL (ref 8.7–10.3)
Chloride: 92 mmol/L — ABNORMAL LOW (ref 96–106)
Creatinine, Ser: 0.79 mg/dL (ref 0.57–1.00)
Glucose: 104 mg/dL — ABNORMAL HIGH (ref 70–99)
Potassium: 3.4 mmol/L — ABNORMAL LOW (ref 3.5–5.2)
Sodium: 136 mmol/L (ref 134–144)
eGFR: 82 mL/min/1.73 (ref >59)

## 2023-08-11 LAB — MAGNESIUM: Magnesium: 1.6 mg/dL (ref 1.6–2.3)

## 2023-08-12 ENCOUNTER — Telehealth: Payer: Self-pay | Admitting: *Deleted

## 2023-08-12 DIAGNOSIS — E876 Hypokalemia: Secondary | ICD-10-CM

## 2023-08-12 NOTE — Telephone Encounter (Signed)
 CRM # [id number] Owner: None Status: Resolved Open  Priority: Routine Created on: 08/12/2023 09:54 AM By: Milissa Promise D   Primary Information  Source  Janice Massey (Patient)   Subject  Janice Massey (Patient)   Topic  Clinical - Lab/Test Results     Communication  Reason for CRM: Patient returned missed call for labs. Read note as written by provider. Patient understood. No Further questions at this time. Unable to schedule lab for 1 week - no order in yet. Patient said call her next week to schedule. Thank You.

## 2023-08-12 NOTE — Addendum Note (Signed)
 Addended by: Verniece Encarnacion on: 08/12/2023 11:52 AM   Modules accepted: Orders

## 2023-08-15 NOTE — Telephone Encounter (Signed)
-----   Message from Nurse Gaetana SQUIBB sent at 08/12/2023 12:34 PM EDT -----  ----- Message ----- From: Shawn Sick, MD Sent: 08/12/2023  12:19 PM EDT To: Kenneth JULIANNA Goldberg, RN; Gaetana VEAR Pan, RN  Hi, not sure who to contact for this but can we make sure she gets her vitals checked when she comes in for her BMP next week? She would like to come in on Wednesday 8/13 at 10 AM. We are stopping  her BP meds given her persistent hypokalemia and want to make sure her BP isn't too elevated after being off the medications.  ----- Message ----- From: Rebecka, Lab [facility] [place] Sent: 08/10/2023  10:25 AM EDT To: Sick Shawn, MD

## 2023-08-16 NOTE — Addendum Note (Signed)
 Addended by: ANTONE DWAYNE SAILOR on: 08/16/2023 03:43 PM   Modules accepted: Orders

## 2023-08-17 ENCOUNTER — Ambulatory Visit: Admitting: *Deleted

## 2023-08-17 ENCOUNTER — Encounter: Admitting: Student

## 2023-08-17 ENCOUNTER — Other Ambulatory Visit

## 2023-08-17 DIAGNOSIS — E876 Hypokalemia: Secondary | ICD-10-CM

## 2023-08-17 NOTE — Progress Notes (Unsigned)
    Dene Jenkins Public presented today for blood pressure check. Patient is prescribed blood pressure medications and I confirmed that patient didnot take their blood pressure medication prior to today's appointment. Blood pressure was taken in the usual and appropriate manner using an automated BP cuff.     Vitals:   08/17/23 1020  BP: 136/77      Results of today's visit will be routed to Dr. Rea  for review and further management.   Patient was here with an interpreter  from [facility].

## 2023-08-18 ENCOUNTER — Telehealth: Payer: Self-pay | Admitting: *Deleted

## 2023-08-18 LAB — BASIC METABOLIC PANEL WITH GFR
BUN/Creatinine Ratio: 14 (ref 12–28)
BUN: 11 mg/dL (ref 8–27)
CO2: 18 mmol/L — ABNORMAL LOW (ref 20–29)
Calcium: 9.6 mg/dL (ref 8.7–10.3)
Chloride: 104 mmol/L (ref 96–106)
Creatinine, Ser: 0.76 mg/dL (ref 0.57–1.00)
Glucose: 115 mg/dL — ABNORMAL HIGH (ref 70–99)
Potassium: 4.2 mmol/L (ref 3.5–5.2)
Sodium: 140 mmol/L (ref 134–144)
eGFR: 86 mL/min/1.73 (ref >59)

## 2023-08-18 NOTE — Telephone Encounter (Signed)
 Will forward to Dr. Shawn. Copied from CRM [id number]. Topic: Clinical - Lab/Test Results >> Aug 18, 2023  9:54 AM Mercer PEDLAR wrote: Reason for CRM: Patient would like a call when lab results which were done on 08/17/23 have been reviewed and and is requesting a refill for potassium chloride  SA (KLOR-CON  M) 20 MEQ tablet.   [id number] [place], [place] - [id number] AT [facility] [address], [place] [address] Phone: [phone]  Fax: [phone]

## 2023-08-19 NOTE — Progress Notes (Signed)
 ok

## 2023-08-22 ENCOUNTER — Telehealth: Payer: Self-pay | Admitting: *Deleted

## 2023-08-22 ENCOUNTER — Other Ambulatory Visit: Payer: Self-pay

## 2023-08-22 ENCOUNTER — Ambulatory Visit: Payer: Self-pay

## 2023-08-22 NOTE — Telephone Encounter (Unsigned)
 Copied from CRM #[id number]. Topic: Clinical - Medication Refill >> Aug 22, 2023  1:29 PM Carrielelia G wrote: Medication: potassium chloride  SA (KLOR-CON  M) 20 MEQ table  Has the patient contacted their pharmacy? Yes (Agent: If no, request that the patient contact the pharmacy for the refill. If patient does not wish to contact the pharmacy document the reason why and proceed with request.) (Agent: If yes, when and what did the pharmacy advise?)  This is the patient's preferred pharmacy:  [facility] ST[facility] #[id number] [facility], [place] - [address] AT [facility] [address] [place] [address] Phone: [phone] Fax: [phone]  Is this the correct pharmacy for this prescription? Yes If no, delete pharmacy and type the correct one.    Is the patient out of the medication? Yes  Has the patient been seen for an appointment in the last year OR does the patient have an upcoming appointment? Yes  Can we respond through MyChart? No  Agent: Please be advised that Rx refills may take up to 3 business days. We ask that you follow-up with your pharmacy.

## 2023-08-22 NOTE — Telephone Encounter (Unsigned)
 Copied from CRM #[id number]. Topic: Clinical - Medication Refill >> Aug 22, 2023 11:03 AM Miquel SAILOR wrote: Medication: potassium chloride  SA (KLOR-CON  M) 20 MEQ tablet   Has the patient contacted their pharmacy? Yes (Agent: If no, request that the patient contact the pharmacy for the refill. If patient does not wish to contact the pharmacy document the reason why and proceed with request.) (Agent: If yes, when and what did the pharmacy advise?)  This is the patient's preferred pharmacy:  [facility] STORE #[id number] [facility], [place] - [address] AT [facility] [address] [place] [address] Phone: [phone] Fax: [phone]  [facility] #[id number] [place], [place] - [address] AT [facility] [address] [place] [address] Phone: [phone] Fax: [phone]    Is this the correct pharmacy for this prescription? Yes If no, delete pharmacy and type the correct one.   Has the prescription been filled recently? Yes  Is the patient out of the medication? Yes  Has the patient been seen for an appointment in the last year OR does the patient have an upcoming appointment? Yes  Can we respond through MyChart? Yes  Agent: Please be advised that Rx refills may take up to 3 business days. We ask that you follow-up with your pharmacy.

## 2023-08-22 NOTE — Telephone Encounter (Unsigned)
 Copied from CRM #[id number]. Topic: Clinical - Lab/Test Results >> Aug 22, 2023 11:05 AM Miquel SAILOR wrote: Reason for CRM: Patient calling on Labs results from 08/13. Needs call back [phone]

## 2023-08-24 DIAGNOSIS — S46012D Strain of muscle(s) and tendon(s) of the rotator cuff of left shoulder, subsequent encounter: Secondary | ICD-10-CM | POA: Diagnosis not present

## 2023-09-20 ENCOUNTER — Telehealth: Payer: Self-pay | Admitting: *Deleted

## 2023-09-20 DIAGNOSIS — E785 Hyperlipidemia, unspecified: Secondary | ICD-10-CM

## 2023-09-20 MED ORDER — ATORVASTATIN CALCIUM 40 MG PO TABS: 40.0000 mg | ORAL_TABLET | Freq: Every day | ORAL | 3 refills | Status: AC

## 2023-09-20 NOTE — Telephone Encounter (Signed)
 Refill request

## 2023-09-22 ENCOUNTER — Ambulatory Visit (HOSPITAL_COMMUNITY)
Admission: EM | Admit: 2023-09-22 | Discharge: 2023-09-22 | Disposition: A | Discharge status: Home / Self Care | Attending: Internal Medicine | Admitting: Internal Medicine

## 2023-09-22 ENCOUNTER — Encounter (HOSPITAL_COMMUNITY): Payer: Self-pay

## 2023-09-22 ENCOUNTER — Ambulatory Visit (INDEPENDENT_AMBULATORY_CARE_PROVIDER_SITE_OTHER)

## 2023-09-22 DIAGNOSIS — M25512 Pain in left shoulder: Secondary | ICD-10-CM

## 2023-09-22 DIAGNOSIS — S060X0A Concussion without loss of consciousness, initial encounter: Secondary | ICD-10-CM | POA: Diagnosis not present

## 2023-09-22 MED ORDER — KETOROLAC TROMETHAMINE 30 MG/ML IJ SOLN
15.0000 mg | Freq: Once | INTRAMUSCULAR | Status: AC
  Administered 2023-09-22: 15 mg via INTRAMUSCULAR

## 2023-09-22 MED ORDER — KETOROLAC TROMETHAMINE 30 MG/ML IJ SOLN
INTRAMUSCULAR | Status: AC
  Filled 2023-09-22: qty 1

## 2023-09-22 NOTE — Discharge Instructions (Addendum)
 X-ray of the left shoulder done today.  Final evaluation by the radiologist does not show any any abnormalities.  Continue to increase activity with the shoulder as tolerated.  Monitor the symptoms and if there is no significant improvement in the shoulder symptoms may need to follow-up with orthopedics for further evaluation.  May continue to take Tylenol  or ibuprofen  for pain. Today we have given you a small injection of Toradol  which is a pain medication but not a narcotic.  In regards to the headache, the physical exam findings are reassuring. There may be a mild concussion but There are no neurological deficits to suggest an intracranial issue.  Recommend continuing to monitor this.  As long as there is continued improvement in the headache then can continue to manage at home.  If there is any significant worsening in the headache then recommend going to the emergency room for further evaluation.

## 2023-09-22 NOTE — ED Triage Notes (Signed)
 Pt states tripped and fell this morning on her concrete front porch and hit the back of her head. Denies LOC or blood thinners. C/o headache and lt shoulder pain. States took tylenol  with little relief.

## 2023-09-22 NOTE — ED Provider Notes (Addendum)
 [facility]    CSN: [id number] Arrival date & time: 09/22/23  1746      History   Chief Complaint Chief Complaint  Patient presents with   Fall    HPI Brandi Mendez is a 68 y.o. female.   68 year old female presents urgent care with complaints of left shoulder pain and mild headache after a fall this morning.  She reports she tripped on her porch and fell backwards.  She did land on her left shoulder and her head did hit the concrete.  She did not lose consciousness.  She was able to get up and start moving around without much difficulty but has had a headache.  She took Tylenol  which improved the headache.  She denies any slurred speech or blurred vision at this time.  She also reports that since that time her left shoulder has been very tender and very painful with movement.  She denies any history of trouble with the shoulder in the past.   Fall Associated symptoms include headaches. Pertinent negatives include no chest pain, no abdominal pain and no shortness of breath.    Past Medical History:  Diagnosis Date   Small bowel obstruction (HCC) 2013, 2015    Patient Active Problem List   Diagnosis Date Noted   Partial bowel obstruction (HCC) 05/17/2022   Pruritus 02/08/2022   Abdominal pain 03/29/2020   History of small bowel obstruction    Diarrhea    Cough 06/10/2019   Left shoulder pain 06/10/2019   Otalgia of right ear 01/31/2019   Vision changes 01/31/2019   Chest pain of uncertain etiology 10/05/2012   Hypotension 10/05/2012   HYPERTRIGLYCERIDEMIA, MILD 12/26/2006   OBESITY, UNSPECIFIED 12/26/2006    Past Surgical History:  Procedure Laterality Date   ABDOMINAL HERNIA REPAIR     ABDOMINAL HYSTERECTOMY     [place]   BREAST BIOPSY Left 06/29/2023   MM LT BREAST BX W LOC DEV 1ST LESION IMAGE BX SPEC STEREO GUIDE 06/29/2023 [facility]   CHOLECYSTECTOMY     Left ankle surgery      OB History   No obstetric history on file.       Home Medications    Prior to Admission medications   Medication Sig Start Date End Date Taking? Authorizing Provider  acetaminophen  (TYLENOL ) 500 MG tablet Take 500 mg by mouth every 6 (six) hours as needed.    [provider]  famotidine  (PEPCID ) 20 MG tablet Take 1 tablet (20 mg total) by mouth 2 (two) times daily. 02/16/23   Small, Brooke L, PA  ibuprofen  (ADVIL ) 400 MG tablet Take 1 tablet (400 mg total) by mouth every 6 (six) hours as needed (mild pain, fever >100.4). 05/18/22   Dameron, Marisa, DO  omeprazole  (PRILOSEC) 20 MG capsule Take 1 capsule (20 mg total) by mouth daily. 08/10/23   Robinson, John K, PA-C  polyethylene glycol (MIRALAX ) 17 g packet Take 17 g by mouth 2 (two) times daily. 05/18/22   Elicia Hamlet, MD  fluticasone  (FLONASE ) 50 MCG/ACT nasal spray Place 2 sprays into both nostrils daily. 01/24/19 08/07/19  Halbert Mariano SQUIBB, DO    Family History Family History  Problem Relation Age of Onset   Cancer Mother        Breast? but patient not sure   Diabetes Father    Diabetes Sister    Early death Brother 76       aneurysm   Heart disease Maternal Grandmother    Diabetes  Paternal Grandfather     Social History Social History   Tobacco Use   Smoking status: Never    Passive exposure: Never   Smokeless tobacco: Never  Vaping Use   Vaping status: Never Used  Substance Use Topics   Alcohol use: Yes    Comment: rarely   Drug use: No     Allergies   Patient has no known allergies.   Review of Systems Review of Systems  Constitutional:  Negative for chills and fever.  HENT:  Negative for ear pain and sore throat.   Eyes:  Negative for pain and visual disturbance.  Respiratory:  Negative for cough and shortness of breath.   Cardiovascular:  Negative for chest pain and palpitations.  Gastrointestinal:  Negative for abdominal pain and vomiting.  Genitourinary:  Negative for dysuria and hematuria.  Musculoskeletal:  Negative for arthralgias and  back pain.       Left shoulder pain with movement  Skin:  Negative for color change and rash.  Neurological:  Positive for headaches. Negative for dizziness, seizures, syncope, speech difficulty, weakness and numbness.  All other systems reviewed and are negative.    Physical Exam Triage Vital Signs ED Triage Vitals [09/22/23 1756]  Encounter Vitals Group     BP 127/85     Girls Systolic BP Percentile      Girls Diastolic BP Percentile      Boys Systolic BP Percentile      Boys Diastolic BP Percentile      Pulse Rate 79     Resp 18     Temp 98.2 F (36.8 C)     Temp Source Oral     SpO2 98 %     Weight      Height      Head Circumference      Peak Flow      Pain Score 8     Pain Loc      Pain Education      Exclude from Growth Chart    No data found.  Updated Vital Signs BP 127/85 (BP Location: Right Arm)   Pulse 79   Temp 98.2 F (36.8 C) (Oral)   Resp 18   SpO2 98%   Visual Acuity Right Eye Distance:   Left Eye Distance:   Bilateral Distance:    Right Eye Near:   Left Eye Near:    Bilateral Near:     Physical Exam Vitals and nursing note reviewed.  Constitutional:      General: She is not in acute distress.    Appearance: Normal appearance. She is well-developed. She is not ill-appearing.  HENT:     Head: Normocephalic and atraumatic.     Nose: Nose normal.     Mouth/Throat:     Mouth: Mucous membranes are moist.  Eyes:     Conjunctiva/sclera: Conjunctivae normal.  Cardiovascular:     Rate and Rhythm: Normal rate and regular rhythm.     Heart sounds: No murmur heard. Pulmonary:     Effort: Pulmonary effort is normal. No respiratory distress.     Breath sounds: Normal breath sounds.  Abdominal:     Palpations: Abdomen is soft.     Tenderness: There is no abdominal tenderness.  Musculoskeletal:        General: No swelling.     Left shoulder: Tenderness present. No swelling or deformity. Decreased range of motion (with abduction). Normal  strength. Normal pulse.       Arms:  Cervical back: Neck supple.  Skin:    General: Skin is warm and dry.     Capillary Refill: Capillary refill takes less than 2 seconds.  Neurological:     General: No focal deficit present.     Mental Status: She is alert and oriented to person, place, and time.     Cranial Nerves: No cranial nerve deficit.     Coordination: Coordination normal.     Gait: Gait normal.  Psychiatric:        Mood and Affect: Mood normal.        Behavior: Behavior normal.        Thought Content: Thought content normal.        Judgment: Judgment normal.      UC Treatments / Results  Labs (all labs ordered are listed, but only abnormal results are displayed) Labs Reviewed - No data to display  EKG   Radiology DG Shoulder Left Result Date: 09/22/2023 CLINICAL DATA:  Fall and left shoulder pain. EXAM: LEFT SHOULDER - 2+ VIEW COMPARISON:  None Available. FINDINGS: There is no evidence of fracture or dislocation. There is no evidence of arthropathy or other focal bone abnormality. Soft tissues are unremarkable. IMPRESSION: Negative. Electronically Signed   By: Vanetta Chou M.D.   On: 09/22/2023 19:14    Procedures Procedures (including critical care time)  Medications Ordered in UC Medications  ketorolac  (TORADOL ) 30 MG/ML injection 15 mg (has no administration in time range)    Initial Impression / Assessment and Plan / UC Course  I have reviewed the triage vital signs and the nursing notes.  Pertinent labs & imaging results that were available during my care of the patient were reviewed by me and considered in my medical decision making (see chart for details).     Acute pain of left shoulder - Plan: DG Shoulder Left, DG Shoulder Left  Concussion without loss of consciousness, initial encounter   X-ray of the left shoulder done today.  Final evaluation by the radiologist does not show any any abnormalities.  Continue to increase activity with  the shoulder as tolerated.  Monitor the symptoms and if there is no significant improvement in the shoulder symptoms may need to follow-up with orthopedics for further evaluation.  May continue to take Tylenol  or ibuprofen  for pain.  Today we have given you a small injection of Toradol  15 mg which is a pain medication but not a narcotic.  In regards to the headache, the physical exam findings are reassuring. There may be a mild concussion but There are no neurological deficits to suggest an intracranial issue.  Recommend continuing to monitor this.  As long as there is continued improvement in the headache then can continue to manage at home.  If there is any significant worsening in the headache then recommend going to the emergency room for further evaluation.  Final Clinical Impressions(s) / UC Diagnoses   Final diagnoses:  Acute pain of left shoulder  Concussion without loss of consciousness, initial encounter     Discharge Instructions      X-ray of the left shoulder done today.  Final evaluation by the radiologist does not show any any abnormalities.  Continue to increase activity with the shoulder as tolerated.  Monitor the symptoms and if there is no significant improvement in the shoulder symptoms may need to follow-up with orthopedics for further evaluation.  May continue to take Tylenol  or ibuprofen  for pain. Today we have given you a small injection of  Toradol  which is a pain medication but not a narcotic.  In regards to the headache, the physical exam findings are reassuring. There may be a mild concussion but There are no neurological deficits to suggest an intracranial issue.  Recommend continuing to monitor this.  As long as there is continued improvement in the headache then can continue to manage at home.  If there is any significant worsening in the headache then recommend going to the emergency room for further evaluation.     ED Prescriptions   None    PDMP not reviewed  this encounter.   Teresa Almarie LABOR, PA-C 09/22/23 1927    Teresa Almarie LABOR, PA-C 09/22/23 1934

## 2023-09-26 ENCOUNTER — Telehealth: Payer: Self-pay | Admitting: *Deleted

## 2023-09-26 NOTE — Telephone Encounter (Signed)
 Left message with her phone system for her to call the breast center to schedule her mammogram @ [phone].

## 2023-09-28 ENCOUNTER — Encounter: Payer: Self-pay | Admitting: Gastroenterology

## 2023-10-04 ENCOUNTER — Telehealth: Payer: Self-pay | Admitting: *Deleted

## 2023-10-04 NOTE — Telephone Encounter (Signed)
 Mammogram appt sheet mailed out to patient to reminder her to call and schedule her mammogram / attached with that was the information regarding the 75.00 now show fee. The patient uses sign language .

## 2023-10-07 ENCOUNTER — Other Ambulatory Visit: Payer: Self-pay

## 2023-10-07 DIAGNOSIS — K219 Gastro-esophageal reflux disease without esophagitis: Secondary | ICD-10-CM

## 2023-10-07 MED ORDER — OMEPRAZOLE 40 MG PO CPDR: 40.0000 mg | DELAYED_RELEASE_CAPSULE | Freq: Every day | ORAL | 3 refills | Status: AC

## 2023-10-07 NOTE — Telephone Encounter (Signed)
 Medication sent to pharmacy

## 2023-10-10 ENCOUNTER — Telehealth: Payer: Self-pay | Admitting: *Deleted

## 2023-10-10 NOTE — Telephone Encounter (Signed)
 Copied from CRM #[id number]. Topic: Clinical - Request for Lab/Test Order >> Oct 10, 2023 12:01 PM Brittney F wrote: Reason for CRM:   Caller: Elijah (Patient's daughter)  Calling in to inform the office that the patient has had her mammogram completed on February 18, 2023. The patient's breast center is requesting a request for results be requested from the patient's primary care office.   [facility] [address] [place], [place], [place] [address] Phone: [phone] Fax: [phone]

## 2023-11-02 ENCOUNTER — Ambulatory Visit: Admitting: Podiatry

## 2023-11-09 ENCOUNTER — Ambulatory Visit: Admitting: Podiatry

## 2023-11-14 ENCOUNTER — Ambulatory Visit: Attending: Cardiovascular Disease | Admitting: Cardiovascular Disease

## 2023-11-14 VITALS — BP 120/70 | HR 60 | Ht 64.0 in | Wt 175.0 lb

## 2023-11-14 DIAGNOSIS — R079 Chest pain, unspecified: Secondary | ICD-10-CM

## 2023-11-14 DIAGNOSIS — Z79899 Other long term (current) drug therapy: Secondary | ICD-10-CM

## 2023-11-14 LAB — LIPID PANEL

## 2023-11-14 NOTE — Progress Notes (Signed)
 11/14/2023 [name]   Feb 26, 1955  [id number]  Primary Physician Health, [facility] Primary Cardiologist: Dorn JINNY Lesches MD FACP, [place], [place], [place]  HPI:  Brandi Mendez is a 68 y.o. moderately overweight single African-American female mother of 1 child, grandmother of 5 grandchildren referred by the ER where she was seen 08/10/2023 with atypical chest pain.  Her only risk factor is treated hyperlipidemia.  She has never smoked.  There is no family history of heart disease.  She never had a heart attack or stroke.  She walks fairly frequently and plays with her grandchildren without symptoms.  She has had no chest pain since her ER visit.  She did have a 2D echo and a Myoview  stress test performed in 2021 which were essentially normal done in the evaluation of atypical chest pain.   Current Meds  Medication Sig   acetaminophen  (TYLENOL ) 500 MG tablet Take 500 mg by mouth every 6 (six) hours as needed.   famotidine  (PEPCID ) 20 MG tablet Take 1 tablet (20 mg total) by mouth 2 (two) times daily.   ibuprofen  (ADVIL ) 400 MG tablet Take 1 tablet (400 mg total) by mouth every 6 (six) hours as needed (mild pain, fever >100.4).   omeprazole  (PRILOSEC) 20 MG capsule Take 1 capsule (20 mg total) by mouth daily.   polyethylene glycol (MIRALAX ) 17 g packet Take 17 g by mouth 2 (two) times daily.   rosuvastatin (CRESTOR) 5 MG tablet Take 5 mg by mouth daily.     No Known Allergies  Social History   Socioeconomic History   Marital status: Single    Spouse name: Not on file   Number of children: Not on file   Years of education: Not on file   Highest education level: Not on file  Occupational History   Occupation: Retired  Tobacco Use   Smoking status: Never    Passive exposure: Never   Smokeless tobacco: Never  Vaping Use   Vaping status: Never Used  Substance and Sexual Activity   Alcohol use: Yes    Comment: rarely   Drug use: No   Sexual activity: Not on file  Other Topics  Concern   Not on file  Social History Narrative   Not on file   Social Drivers of Health   Financial Resource Strain: Not on file  Food Insecurity: No Food Insecurity (05/17/2022)   Hunger Vital Sign    Worried About Running Out of Food in the Last Year: Never true    Ran Out of Food in the Last Year: Never true  Transportation Needs: No Transportation Needs (05/18/2022)   PRAPARE - Administrator, Civil Service (Medical): No    Lack of Transportation (Non-Medical): No  Physical Activity: Not on file  Stress: Not on file  Social Connections: Not on file  Intimate Partner Violence: Not At Risk (05/17/2022)   Humiliation, Afraid, Rape, and Kick questionnaire    Fear of Current or Ex-Partner: No    Emotionally Abused: No    Physically Abused: No    Sexually Abused: No     Review of Systems: General: negative for chills, fever, night sweats or weight changes.  Cardiovascular: negative for chest pain, dyspnea on exertion, edema, orthopnea, palpitations, paroxysmal nocturnal dyspnea or shortness of breath Dermatological: negative for rash Respiratory: negative for cough or wheezing Urologic: negative for hematuria Abdominal: negative for nausea, vomiting, diarrhea, bright red blood per rectum, melena, or hematemesis Neurologic: negative  for visual changes, syncope, or dizziness All other systems reviewed and are otherwise negative except as noted above.    Blood pressure 120/70, pulse 60, height 5' 4 (1.626 m), weight 175 lb (79.4 kg).  General appearance: alert and no distress Neck: no adenopathy, no carotid bruit, no JVD, supple, symmetrical, trachea midline, and thyroid not enlarged, symmetric, no tenderness/mass/nodules Lungs: clear to auscultation bilaterally Heart: regular rate and rhythm, S1, S2 normal, no murmur, click, rub or gallop Extremities: extremities normal, atraumatic, no cyanosis or edema Pulses: 2+ and symmetric Skin: Skin color, texture, turgor  normal. No rashes or lesions Neurologic: Grossly normal  EKG not performed today      ASSESSMENT AND PLAN:   HYPERTRIGLYCERIDEMIA, MILD History of hypertriglyceridemia on low-dose statin therapy.  We will check a fasting lipid liver profile today.  Chest pain of uncertain etiology History of atypical chest pain in the past with a recent ER visit on 08/10/2023.  There was no etiology determined.  She was evaluated by Dr. Alveta 08/07/2019 with a normal 2D echo and a normal perfusion study.  She has had no recurrent chest pain.  I suspect her pain was noncardiac.     Dorn DOROTHA Lesches MD FACP,FACC,FAHA, [facility] 11/14/2023 9:05 AM

## 2023-11-14 NOTE — Assessment & Plan Note (Signed)
 History of atypical chest pain in the past with a recent ER visit on 08/10/2023.  There was no etiology determined.  She was evaluated by Dr. Alveta 08/07/2019 with a normal 2D echo and a normal perfusion study.  She has had no recurrent chest pain.  I suspect her pain was noncardiac.

## 2023-11-14 NOTE — Assessment & Plan Note (Signed)
 History of hypertriglyceridemia on low-dose statin therapy.  We will check a fasting lipid liver profile today.

## 2023-11-14 NOTE — Patient Instructions (Signed)
 Medication Instructions:  Your physician recommends that you continue on your current medications as directed. Please refer to the Current Medication list given to you today.  *If you need a refill on your cardiac medications before your next appointment, please call your pharmacy*  Lab Work: Lipids, Lft If you have labs (blood work) drawn today and your tests are completely normal, you will receive your results only by: MyChart Message (if you have MyChart) OR A paper copy in the mail If you have any lab test that is abnormal or we need to change your treatment, we will call you to review the results.   Follow-Up: At [facility], you and your health needs are our priority.  As part of our continuing mission to provide you with exceptional heart care, our providers are all part of one team.  This team includes your primary Cardiologist (physician) and Advanced Practice Providers or APPs (Physician Assistants and Nurse Practitioners) who all work together to provide you with the care you need, when you need it.  Your next appointment:    As needed  Provider:   Dorn Lesches, MD

## 2023-11-15 ENCOUNTER — Ambulatory Visit: Payer: Self-pay | Admitting: Cardiovascular Disease

## 2023-11-15 ENCOUNTER — Ambulatory Visit

## 2023-11-15 LAB — LIPID PANEL
Cholesterol, Total: 146 mg/dL (ref 100–199)
HDL: 46 mg/dL (ref >39)
LDL CALC COMMENT:: 3.2 ratio (ref 0.0–4.4)
LDL Chol Calc (NIH): 85 mg/dL (ref 0–99)
Triglycerides: 79 mg/dL (ref 0–149)
VLDL Cholesterol Cal: 15 mg/dL (ref 5–40)

## 2023-11-15 LAB — HEPATIC FUNCTION PANEL
ALT: 11 IU/L (ref 0–32)
AST: 16 IU/L (ref 0–40)
Albumin: 4.4 g/dL (ref 3.9–4.9)
Alkaline Phosphatase: 79 IU/L (ref 49–135)
Bilirubin Total: 1.4 mg/dL — ABNORMAL HIGH (ref 0.0–1.2)
Bilirubin, Direct: 0.35 mg/dL (ref 0.00–0.40)
Total Protein: 6.9 g/dL (ref 6.0–8.5)

## 2023-11-16 ENCOUNTER — Ambulatory Visit: Admitting: Podiatry

## 2023-11-18 ENCOUNTER — Ambulatory Visit: Admitting: Gastroenterology

## 2023-11-23 ENCOUNTER — Emergency Department (HOSPITAL_COMMUNITY)
Admission: EM | Admit: 2023-11-23 | Discharge: 2023-11-24 | Disposition: A | Attending: Emergency Medicine | Admitting: Emergency Medicine

## 2023-11-23 ENCOUNTER — Encounter (HOSPITAL_COMMUNITY): Payer: Self-pay | Admitting: Emergency Medicine

## 2023-11-23 ENCOUNTER — Other Ambulatory Visit: Payer: Self-pay

## 2023-11-23 ENCOUNTER — Emergency Department (HOSPITAL_COMMUNITY)

## 2023-11-23 DIAGNOSIS — K529 Noninfective gastroenteritis and colitis, unspecified: Secondary | ICD-10-CM | POA: Insufficient documentation

## 2023-11-23 DIAGNOSIS — R1033 Periumbilical pain: Secondary | ICD-10-CM

## 2023-11-23 DIAGNOSIS — Z79899 Other long term (current) drug therapy: Secondary | ICD-10-CM | POA: Insufficient documentation

## 2023-11-23 LAB — COMPREHENSIVE METABOLIC PANEL WITH GFR
ALT: 18 U/L (ref 0–44)
AST: 18 U/L (ref 15–41)
Albumin: 4.3 g/dL (ref 3.5–5.0)
Alkaline Phosphatase: 66 U/L (ref 38–126)
Anion gap: 12 (ref 5–15)
BUN: 15 mg/dL (ref 8–23)
CO2: 24 mmol/L (ref 22–32)
Calcium: 9.6 mg/dL (ref 8.9–10.3)
Chloride: 105 mmol/L (ref 98–111)
Creatinine, Ser: 0.8 mg/dL (ref 0.44–1.00)
GFR, Estimated: >60 mL/min (ref >60)
Glucose, Bld: 89 mg/dL (ref 70–99)
Potassium: 3.8 mmol/L (ref 3.5–5.1)
Sodium: 141 mmol/L (ref 135–145)
Total Bilirubin: 1.5 mg/dL — ABNORMAL HIGH (ref 0.0–1.2)
Total Protein: 7.8 g/dL (ref 6.5–8.1)

## 2023-11-23 LAB — CBC WITH DIFFERENTIAL/PLATELET
Abs Immature Granulocytes: 0.01 K/uL (ref 0.00–0.07)
Basophils Absolute: 0 K/uL (ref 0.0–0.1)
Basophils Relative: 0 %
Eosinophils Absolute: 0.1 K/uL (ref 0.0–0.5)
Eosinophils Relative: 1 %
HCT: 39.2 % (ref 36.0–46.0)
Hemoglobin: 12.9 g/dL (ref 12.0–15.0)
Immature Granulocytes: 0 %
Lymphocytes Relative: 32 %
Lymphs Abs: 2.4 K/uL (ref 0.7–4.0)
MCH: 31.2 pg (ref 26.0–34.0)
MCHC: 32.9 g/dL (ref 30.0–36.0)
MCV: 94.9 fL (ref 80.0–100.0)
Monocytes Absolute: 0.5 K/uL (ref 0.1–1.0)
Monocytes Relative: 7 %
Neutro Abs: 4.4 K/uL (ref 1.7–7.7)
Neutrophils Relative %: 60 %
Platelets: 306 K/uL (ref 150–400)
RBC: 4.13 MIL/uL (ref 3.87–5.11)
RDW: 12.7 % (ref 11.5–15.5)
WBC: 7.4 K/uL (ref 4.0–10.5)
nRBC: 0 % (ref 0.0–0.2)

## 2023-11-23 LAB — LIPASE, BLOOD: Lipase: 20 U/L (ref 11–51)

## 2023-11-23 LAB — URINALYSIS, ROUTINE W REFLEX MICROSCOPIC
Bilirubin Urine: NEGATIVE
Glucose, UA: NEGATIVE mg/dL
Ketones, ur: NEGATIVE mg/dL
Nitrite: NEGATIVE
Protein, ur: NEGATIVE mg/dL
Specific Gravity, Urine: 1.023 (ref 1.005–1.030)
pH: 5 (ref 5.0–8.0)

## 2023-11-23 MED ORDER — IOHEXOL 350 MG/ML SOLN
75.0000 mL | Freq: Once | INTRAVENOUS | Status: AC | PRN
  Administered 2023-11-23: 75 mL via INTRAVENOUS

## 2023-11-23 NOTE — ED Provider Triage Note (Signed)
 Emergency Medicine Provider Triage Evaluation Note  Brandi Mendez , a 68 y.o. female  was evaluated in triage.  Pt complains of umbilical level abdominal pain x 2 days.  History of SBO.  Review of Systems  Positive: 1 episode emesis, abdominal pain, stool with smaller caliber today Negative: Fever, chills, shortness of breath, nausea, chest pain  Physical Exam  BP 127/88 (BP Location: Right Arm)   Pulse 76   Temp 98.2 F (36.8 C)   Resp 18   SpO2 100%  Gen:   Awake, no distress   Resp:  Normal effort  MSK:   Moves extremities without difficulty  Other:    Medical Decision Making  Medically screening exam initiated at 7:24 PM.  Appropriate orders placed.  Brandi Mendez was informed that the remainder of the evaluation will be completed by another provider, this initial triage assessment does not replace that evaluation, and the importance of remaining in the ED until their evaluation is complete.  Labs and imaging ordered   Brandi Mendez 11/23/23 8074

## 2023-11-23 NOTE — ED Triage Notes (Signed)
 Patient with umbilical level abdominal pain for 2 days. States she vomited bile today once.  No nausea. Hx of SBO. Has had one bowel movement today however states it was smaller than the normal amount.

## 2023-11-24 MED ORDER — AMOXICILLIN-POT CLAVULANATE 875-125 MG PO TABS: 1.0000 | ORAL_TABLET | Freq: Two times a day (BID) | ORAL | 0 refills | Status: DC

## 2023-11-24 NOTE — ED Provider Notes (Signed)
 [place] EMERGENCY DEPARTMENT AT [facility] Provider Note   CSN: [id number] Arrival date & time: 11/23/23  8086     Patient presents with: Abdominal Pain   Brandi Mendez is a 68 y.o. female.   Patient presents to the emergency department for evaluation of abdominal pain.  Patient reports moderate to severe pain across the middle of her abdomen earlier which has improved.  Patient reports that the symptoms began 2 days ago.  No vomiting.  No diarrhea.  She is concerned because she has a history of bowel obstruction.       Prior to Admission medications   Medication Sig Start Date End Date Taking? Authorizing Provider  amoxicillin -clavulanate (AUGMENTIN ) 875-125 MG tablet Take 1 tablet by mouth every 12 (twelve) hours. 11/24/23  Yes Chivas Notz, Lonni PARAS, MD  acetaminophen  (TYLENOL ) 500 MG tablet Take 500 mg by mouth every 6 (six) hours as needed.    [provider]  famotidine  (PEPCID ) 20 MG tablet Take 1 tablet (20 mg total) by mouth 2 (two) times daily. 02/16/23   Small, Brooke L, PA  ibuprofen  (ADVIL ) 400 MG tablet Take 1 tablet (400 mg total) by mouth every 6 (six) hours as needed (mild pain, fever >100.4). 05/18/22   Dameron, Marisa, DO  omeprazole  (PRILOSEC) 20 MG capsule Take 1 capsule (20 mg total) by mouth daily. 08/10/23   Robinson, John K, PA-C  polyethylene glycol (MIRALAX ) 17 g packet Take 17 g by mouth 2 (two) times daily. 05/18/22   Elicia Hamlet, MD  rosuvastatin (CRESTOR) 5 MG tablet Take 5 mg by mouth daily. 11/02/23   [provider]  fluticasone  (FLONASE ) 50 MCG/ACT nasal spray Place 2 sprays into both nostrils daily. 01/24/19 08/07/19  Mullis, Kiersten P, DO    Allergies: Patient has no known allergies.    Review of Systems  Updated Vital Signs BP 102/71 (BP Location: Left Arm)   Pulse (!) 59   Temp 98.1 F (36.7 C)   Resp 14   Ht 5' 4 (1.626 m)   Wt 78.9 kg   SpO2 99%   BMI 29.87 kg/m   Physical Exam Vitals and nursing note  reviewed.  Constitutional:      General: She is not in acute distress.    Appearance: She is well-developed.  HENT:     Head: Normocephalic and atraumatic.     Mouth/Throat:     Mouth: Mucous membranes are moist.  Eyes:     General: Vision grossly intact. Gaze aligned appropriately.     Extraocular Movements: Extraocular movements intact.     Conjunctiva/sclera: Conjunctivae normal.  Cardiovascular:     Rate and Rhythm: Normal rate and regular rhythm.     Pulses: Normal pulses.     Heart sounds: Normal heart sounds, S1 normal and S2 normal. No murmur heard.    No friction rub. No gallop.  Pulmonary:     Effort: Pulmonary effort is normal. No respiratory distress.     Breath sounds: Normal breath sounds.  Abdominal:     General: Bowel sounds are normal.     Palpations: Abdomen is soft.     Tenderness: There is no abdominal tenderness. There is no guarding or rebound.     Hernia: No hernia is present.  Musculoskeletal:        General: No swelling.     Cervical back: Full passive range of motion without pain, normal range of motion and neck supple. No spinous process tenderness or muscular tenderness.  Normal range of motion.     Right lower leg: No edema.     Left lower leg: No edema.  Skin:    General: Skin is warm and dry.     Capillary Refill: Capillary refill takes less than 2 seconds.     Findings: No ecchymosis, erythema, rash or wound.  Neurological:     General: No focal deficit present.     Mental Status: She is alert and oriented to person, place, and time.     GCS: GCS eye subscore is 4. GCS verbal subscore is 5. GCS motor subscore is 6.     Cranial Nerves: Cranial nerves 2-12 are intact.     Sensory: Sensation is intact.     Motor: Motor function is intact.     Coordination: Coordination is intact.  Psychiatric:        Attention and Perception: Attention normal.        Mood and Affect: Mood normal.        Speech: Speech normal.        Behavior: Behavior normal.      (all labs ordered are listed, but only abnormal results are displayed) Labs Reviewed  COMPREHENSIVE METABOLIC PANEL WITH GFR - Abnormal; Notable for the following components:      Result Value   Total Bilirubin 1.5 (*)    All other components within normal limits  URINALYSIS, ROUTINE W REFLEX MICROSCOPIC - Abnormal; Notable for the following components:   Hgb urine dipstick MODERATE (*)    Leukocytes,Ua TRACE (*)    Bacteria, UA RARE (*)    All other components within normal limits  CBC WITH DIFFERENTIAL/PLATELET  LIPASE, BLOOD    EKG: None  Radiology: CT ABDOMEN PELVIS W CONTRAST Result Date: 11/23/2023 CLINICAL DATA:  Bowel obstruction suspected.  Abdominal pain. EXAM: CT ABDOMEN AND PELVIS WITH CONTRAST TECHNIQUE: Multidetector CT imaging of the abdomen and pelvis was performed using the standard protocol following bolus administration of intravenous contrast. RADIATION DOSE REDUCTION: This exam was performed according to the departmental dose-optimization program which includes automated exposure control, adjustment of the mA and/or kV according to patient size and/or use of iterative reconstruction technique. CONTRAST:  75mL OMNIPAQUE  IOHEXOL  350 MG/ML SOLN COMPARISON:  CT abdomen and pelvis 02/16/2023 FINDINGS: Lower chest: No acute abnormality. Hepatobiliary: No focal liver abnormality is seen. Status post cholecystectomy. No biliary dilatation. Pancreas: Unremarkable. No pancreatic ductal dilatation or surrounding inflammatory changes. Spleen: Normal in size without focal abnormality. Adrenals/Urinary Tract: There is a stable 2.5 cm left renal cyst. Otherwise, the kidneys, adrenal glands and bladder are within normal limits. Stomach/Bowel: There is a single dilated loop of small bowel adhesed to the anterior abdominal wall measuring 4.1 cm. Just proximal to this level there is mild small bowel wall thickening with mesenteric edema. There is no bowel obstruction, pneumatosis or  free air. The stomach is within normal limits. The appendix is not seen. Vascular/Lymphatic: Choose tip Reproductive: Uterus and bilateral adnexa are unremarkable. Other: Anterior abdominal wall mesh is present there is a small fat containing hernia superior to the margin of the mesh. There is a second midline small fat containing ventral hernia in the upper abdominal wall. There is no ascites. Musculoskeletal: No fracture is seen. IMPRESSION: 1. Single dilated loop of small bowel adhered to the anterior abdominal wall with mild small bowel wall thickening and mesenteric edema. Findings are compatible with focal enteritis. No bowel obstruction. 2. Anterior abdominal wall mesh with small fat containing ventral hernias.  3. Left Bosniak I benign renal cyst measuring 2.5 cm. No follow-up recommended. Electronically Signed   By: Greig Pique M.D.   On: 11/23/2023 22:14     Procedures   Medications Ordered in the ED  iohexol  (OMNIPAQUE ) 350 MG/ML injection 75 mL (75 mLs Intravenous Contrast Given 11/23/23 2154)                                    Medical Decision Making  Differential Diagnosis considered includes, but not limited to: Cholelithiasis; cholecystitis; cholangitis; bowel obstruction; esophagitis; gastritis; peptic ulcer disease; pancreatitis; cardiac.\  Presents to the emergency department for evaluation of periumbilical abdominal pain, improving without intervention.  Patient with history of small bowel obstruction.  Patient appears well, vital signs unremarkable.  No vomiting.  Patient's lab work unremarkable.  She underwent CT scan to further evaluate.  Patient with and area of focal enteritis on CT, no evidence of obstruction, no other acute findings.     Final diagnoses:  Periumbilical abdominal pain  Enteritis    ED Discharge Orders          Ordered    amoxicillin -clavulanate (AUGMENTIN ) 875-125 MG tablet  Every 12 hours        11/24/23 0306                Haze Lonni PARAS, MD 11/24/23 [phone]

## 2023-11-24 NOTE — ED Notes (Signed)
 Discharge instructions reviewed.   Newly prescribed medications discussed. Pharmacy verified.   Opportunity for questions and concerns provided.   Alert, oriented and ambulatory.   Displays no signs of distress.

## 2023-11-27 ENCOUNTER — Ambulatory Visit (INDEPENDENT_AMBULATORY_CARE_PROVIDER_SITE_OTHER)

## 2023-11-27 ENCOUNTER — Encounter (HOSPITAL_COMMUNITY): Payer: Self-pay

## 2023-11-27 ENCOUNTER — Ambulatory Visit (HOSPITAL_COMMUNITY): Admission: EM | Admit: 2023-11-27 | Discharge: 2023-11-27 | Disposition: A | Discharge status: Home / Self Care

## 2023-11-27 DIAGNOSIS — S9032XA Contusion of left foot, initial encounter: Secondary | ICD-10-CM | POA: Diagnosis not present

## 2023-11-27 DIAGNOSIS — M79672 Pain in left foot: Secondary | ICD-10-CM | POA: Diagnosis not present

## 2023-11-27 MED ORDER — IBUPROFEN 600 MG PO TABS: 600.0000 mg | ORAL_TABLET | Freq: Four times a day (QID) | ORAL | 0 refills | Status: DC | PRN

## 2023-11-27 NOTE — ED Provider Notes (Signed)
 [facility]  Note:  This document was prepared using Conservation officer, historic buildings and may include unintentional dictation errors.  MRN: [id number] DOB: 07-24-1955  Subjective:   Janice Massey is a 68 y.o. female presenting for evaluation of left foot pain, swelling, bruising after dropping heavy bags of groceries on her left foot earlier today.  Patient reports that she noticed immediate swelling and bruising and pain after injury occurred.  Denies any previous history of foot fracture or injury.  Patient is able to walk with some increased pain.  Has not taken any over-the-counter medication to treat pain prior to arrival in urgent care.  Patient concern for possible fracture to the foot.  No current facility-administered medications for this encounter.  Current Outpatient Medications:    Accu-Chek FastClix Lancets MISC, 1 Units by Does not apply route daily. Use once a daily in morning to check blood glucose, Disp: 102 each, Rfl: 3   aspirin  81 MG chewable tablet, Chew 1 tablet (81 mg total) by mouth daily., Disp: 90 tablet, Rfl: 3   atorvastatin  (LIPITOR) 40 MG tablet, Take 1 tablet (40 mg total) by mouth at bedtime., Disp: 90 tablet, Rfl: 3   bisacodyl  (DULCOLAX) 10 MG suppository, Place 1 suppository (10 mg total) rectally as needed for moderate constipation., Disp: 12 suppository, Rfl: 0   Blood Glucose Monitoring Suppl (ACCU-CHEK GUIDE) w/Device KIT, 1 Units by Does not apply route daily., Disp: 1 kit, Rfl: 0   cetirizine  (ZYRTEC ) 10 MG tablet, Take 1 tablet (10 mg total) by mouth daily., Disp: 90 tablet, Rfl: 1   dorzolamide-timolol (COSOPT) 2-0.5 % ophthalmic solution, SMARTSIG:In Eye(s), Disp: , Rfl:    Empagliflozin -metFORMIN  HCl (SYNJARDY ) 05-998 MG TABS, Take 1 tablet by mouth in the morning and at bedtime., Disp: 180 tablet, Rfl: 3   glucose blood (ACCU-CHEK GUIDE) test strip, Use as instructed, Disp: 100 each, Rfl: 12   ibuprofen  (ADVIL ) 600 MG tablet, Take  1 tablet (600 mg total) by mouth every 6 (six) hours as needed., Disp: 30 tablet, Rfl: 0   ketorolac  (ACULAR ) 0.5 % ophthalmic solution, SMARTSIG:In Eye(s), Disp: , Rfl:    latanoprost  (XALATAN ) 0.005 % ophthalmic solution, INSTILL 1 DROP IN BOTH EYES EVERY DAY IN THE EVENING, Disp: 2.5 mL, Rfl: 1   losartan -hydrochlorothiazide  (HYZAAR) 100-25 MG tablet, Take 1 tablet by mouth daily., Disp: 90 tablet, Rfl: 3   meclizine  (ANTIVERT ) 25 MG tablet, Take 1 tablet (25 mg total) by mouth 3 (three) times daily as needed for dizziness., Disp: 30 tablet, Rfl: 0   omeprazole  (PRILOSEC) 40 MG capsule, Take 1 capsule (40 mg total) by mouth daily., Disp: 90 capsule, Rfl: 3   pantoprazole  (PROTONIX ) 40 MG tablet, Take 1 tablet (40 mg total) by mouth daily., Disp: 30 tablet, Rfl: 0   pioglitazone  (ACTOS ) 30 MG tablet, Take 1 tablet (30 mg total) by mouth daily., Disp: 90 tablet, Rfl: 3   polyethylene glycol (MIRALAX ) 17 g packet, Take 17 g by mouth 2 (two) times daily., Disp: 14 each, Rfl: 0   potassium chloride  SA (KLOR-CON  M) 20 MEQ tablet, Take 1 tablet (20 mEq total) by mouth 2 (two) times daily., Disp: 20 tablet, Rfl: 0   prednisoLONE acetate (PRED FORTE) 1 % ophthalmic suspension, SMARTSIG:In Eye(s), Disp: , Rfl:    prochlorperazine  (COMPAZINE ) 10 MG tablet, Take 1 tablet (10 mg total) by mouth every 6 (six) hours as needed for nausea or vomiting., Disp: 20 tablet, Rfl: 0   Semaglutide  (RYBELSUS ) 14  MG TABS, Take 1 tablet (14 mg total) by mouth daily., Disp: 90 tablet, Rfl: 3   senna-docusate (SENOKOT-S) 8.6-50 MG tablet, Take 2 tablets by mouth 2 (two) times daily., Disp: 30 tablet, Rfl: 0   Cholecalciferol (VITAMIN D3) 10 MCG (400 UNIT) tablet, Take 2 tablets (800 Units total) by mouth daily., Disp: 180 tablet, Rfl: 3   Allergies  Allergen Reactions   Codeine Itching   Lisinopril  Cough   Penicillins Itching    Has patient had a PCN reaction causing immediate rash, facial/tongue/throat swelling, SOB or  lightheadedness with hypotension: YES Has patient had a PCN reaction causing severe rash involving mucus membranes or skin necrosis: NO Has patient had a PCN reaction that required hospitalization NO Has patient had a PCN reaction occurring within the last 10 years: NO If all of the above answers are NO, then may proceed with Cephalosporin use.    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    Hypertension    Mute    Obese    URI (upper respiratory infection)    Ventricular septal defect 2004 per echo   small membranous VSD     Past Surgical History:  Procedure Laterality Date   CARDIAC CATHETERIZATION  08/23/95   CHOLECYSTECTOMY  11/04/1993   COLPOSCOPY     ENDOMETRIAL BIOPSY  02/29/2020       TUBAL LIGATION  10/05/1978    Family History  Problem Relation Age of Onset   Cancer Mother        liver   Heart attack Mother 69   Hypertension Father    Diabetes Father    Heart attack Father    CAD Father 49   Diabetes Paternal Aunt    Diabetes Maternal Grandfather    Breast cancer Cousin    Colon cancer Neg Hx    Endometrial cancer Neg Hx    Pancreatic cancer Neg Hx    Prostate cancer Neg Hx    Ovarian cancer Neg Hx     Social History   Tobacco Use   Smoking status: Never   Smokeless tobacco: Never  Vaping Use   Vaping status: Never Used  Substance Use Topics   Alcohol use: No    Alcohol/week: 0.0 standard drinks of alcohol   Drug use: No    ROS Refer to HPI for ROS details.  Objective:    Vitals: BP (!) 147/83 (BP Location: Left Arm)   Pulse 67   Temp 98.6 F (37  C) (Oral)   Resp 18   SpO2 98%   Physical Exam Vitals and nursing note reviewed.  Constitutional:      General: She is not in acute distress.    Appearance: Normal appearance. She is well-developed. She is not ill-appearing or toxic-appearing.  HENT:     Head: Normocephalic and atraumatic.  Cardiovascular:     Rate and Rhythm: Normal rate.  Pulmonary:     Effort: Pulmonary effort is normal. No respiratory distress.  Musculoskeletal:     Left foot: Normal capillary refill. Swelling,  tenderness and bony tenderness present. No deformity. Normal pulse.  Skin:    General: Skin is warm and dry.     Capillary Refill: Capillary refill takes less than 2 seconds.  Neurological:     General: No focal deficit present.     Mental Status: She is alert and oriented to person, place, and time.  Psychiatric:        Mood and Affect: Mood normal.        Behavior: Behavior normal.     Procedures  No results found for this or any previous visit (from the past 24 hours).  Assessment and Plan :     Discharge Instructions       1. Contusion of left foot, initial encounter (Primary) - DG Foot Complete Left x-ray shows no acute fracture or dislocation to the left foot, most likely soft tissue contusion. - Apply ace wrap to left foot for compression and protection until injury improves. - Apply Post op shoe to left foot for protection and immobilization until injury improves. - ibuprofen  (ADVIL ) 600 MG tablet; Take 1 tablet (600 mg total) by mouth every 6 (six) hours as needed.  Dispense: 30 tablet; Refill: 0 - Apply ice to the area 2-3 times a day for 10 to 15 minutes at a time to help with inflammation and pain - Keep foot elevated when resting to decrease inflammation and pain.  -Continue to monitor symptoms for any change in severity if there is any escalation of current symptoms or development of new symptoms follow-up in ER for further evaluation and management.     Mckyle Solanki B  Adalina Dopson   Kristeen Lantz, Jacona B, [place] 11/27/23 1929

## 2023-11-27 NOTE — ED Triage Notes (Signed)
 Patient presents to office for left foot pain after dropping three bags of grocery on her L-ft foot. Patient reports pain and swelling.

## 2023-11-27 NOTE — Discharge Instructions (Signed)
  1. Contusion of left foot, initial encounter (Primary) - DG Foot Complete Left x-ray shows no acute fracture or dislocation to the left foot, most likely soft tissue contusion. - Apply ace wrap to left foot for compression and protection until injury improves. - Apply Post op shoe to left foot for protection and immobilization until injury improves. - ibuprofen  (ADVIL ) 600 MG tablet; Take 1 tablet (600 mg total) by mouth every 6 (six) hours as needed.  Dispense: 30 tablet; Refill: 0 - Apply ice to the area 2-3 times a day for 10 to 15 minutes at a time to help with inflammation and pain - Keep foot elevated when resting to decrease inflammation and pain.  -Continue to monitor symptoms for any change in severity if there is any escalation of current symptoms or development of new symptoms follow-up in ER for further evaluation and management.

## 2023-11-30 ENCOUNTER — Ambulatory Visit (INDEPENDENT_AMBULATORY_CARE_PROVIDER_SITE_OTHER): Admitting: Podiatry

## 2023-11-30 ENCOUNTER — Encounter: Payer: Self-pay | Admitting: Podiatry

## 2023-11-30 DIAGNOSIS — S9032XA Contusion of left foot, initial encounter: Secondary | ICD-10-CM

## 2023-11-30 NOTE — Progress Notes (Signed)
  Subjective:  Patient ID: Janice Massey, female    DOB: April 04, 1955,   MRN: [id number]  Chief Complaint  Patient presents with   Foot Injury    Left foot injury at [employer]. Dorsal foot discolored purple. 9 pain. NIDDM A1C 7.0.     68 y.o. female presents for concern of left foot injury that occurred on 1123.  She relates she was getting groceries delivered from [place] and they dropped the bag on her foot.  She relates immediate pain and tenderness on the top of the foot.  She relates bruising as well.  She was seen in urgent care and given a boot and x-rays were taken.  X-rays revealed no fracture.  Advised to follow-up here.. Denies any other pedal complaints. Denies n/v/f/c.   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    Hypertension    Mute    Obese    URI (upper respiratory infection)    Ventricular septal defect 2004 per echo   small membranous VSD    Objective:  Physical Exam: Vascular: DP/PT pulses 2/4 bilateral. CFT <3 seconds. Normal hair growth on digits.  Skin. No lacerations or abrasions bilateral feet.  Ecchymosis noted to the dorsal foot on the left.  Edema noted as well Musculoskeletal: MMT 5/5 bilateral lower extremities in DF, PF, Inversion and Eversion. Deceased ROM in DF of ankle joint.  Tender to the dorsum of the foot in the area of the 5th, 4th and 3rd metatarsals.  Pain with range of motion of the 5th, 4th and 3rd digits.  Exam limited due to significant pain noted. Neurological: Sensation intact to light touch.   Assessment:   1. Contusion of left foot, initial  encounter      Plan:  Patient was evaluated and treated and all questions answered. -Xrays reviewed.  No acute fractures or dislocations noted.  Mild edema noted to the dorsum of the left foot. -Discussed treatement options for contusion of the foot; risks, alternatives, and benefits explained. -Dispensed surgical shoe. Patient to wear at all times and instructed on use -Recommend protection, rest, ice, elevation daily until symptoms improve -Anti-inflammatories as needed -Patient to return to office in 4 weeks for reevaluation   Asberry Failing, DPM

## 2023-12-07 ENCOUNTER — Telehealth: Payer: Self-pay

## 2023-12-07 ENCOUNTER — Other Ambulatory Visit: Payer: Self-pay

## 2023-12-07 DIAGNOSIS — I10 Essential (primary) hypertension: Secondary | ICD-10-CM

## 2023-12-07 MED ORDER — LOSARTAN POTASSIUM-HCTZ 100-25 MG PO TABS: 1.0000 | ORAL_TABLET | Freq: Every day | ORAL | 3 refills | Status: AC

## 2023-12-07 NOTE — Telephone Encounter (Signed)
 Prior Authorization for patient (Rybelsus  14MG  tablets) came through on cover my meds was submitted with last office notes and labs awaiting approval or denial.  [id number]

## 2023-12-08 NOTE — Telephone Encounter (Signed)
 Dene Public (Key: [id number]) Rybelsus  14MG  tablets Form [employer] Medicare Part D Electronic Prior Authorization Form (2017 NCPDP) Created 1 day ago Sent to Plan 22 hours ago Plan Response 22 hours ago Submit Clinical Questions 22 hours ago Determination Favorable 20 hours ago Message from Plan Request Reference Number: [id number]. RYBELSUS  TAB 14MG  is approved through 01/03/2025. Your patient may now fill this prescription and it will be covered.. Authorization Expiration Date: January 03, 2025.  Lvm with interpreter regarding the approval.

## 2023-12-13 ENCOUNTER — Telehealth: Payer: Self-pay | Admitting: *Deleted

## 2023-12-13 NOTE — Telephone Encounter (Signed)
 08-02-2023 mammogram appointment was canceled/ reached out to the [facility] to reach out to the patien to get her rescheduled.

## 2024-01-03 ENCOUNTER — Ambulatory Visit (INDEPENDENT_AMBULATORY_CARE_PROVIDER_SITE_OTHER): Admitting: Gastroenterology

## 2024-01-03 ENCOUNTER — Encounter: Payer: Self-pay | Admitting: Gastroenterology

## 2024-01-03 ENCOUNTER — Telehealth: Payer: Self-pay | Admitting: *Deleted

## 2024-01-03 VITALS — BP 122/70 | HR 72 | Ht 64.0 in | Wt 174.0 lb

## 2024-01-03 DIAGNOSIS — K219 Gastro-esophageal reflux disease without esophagitis: Secondary | ICD-10-CM | POA: Diagnosis not present

## 2024-01-03 DIAGNOSIS — Z8719 Personal history of other diseases of the digestive system: Secondary | ICD-10-CM

## 2024-01-03 DIAGNOSIS — K5909 Other constipation: Secondary | ICD-10-CM

## 2024-01-03 DIAGNOSIS — Z1211 Encounter for screening for malignant neoplasm of colon: Secondary | ICD-10-CM

## 2024-01-03 MED ORDER — NA SULFATE-K SULFATE-MG SULF 17.5-3.13-1.6 GM/177ML PO SOLN: 1.0000 | ORAL | 0 refills | Status: DC

## 2024-01-03 NOTE — Telephone Encounter (Unsigned)
 Copied from CRM #[id number]. Topic: Appointments - Scheduling Inquiry for Clinic >> Jan 03, 2024  2:17 PM Diannia H wrote: Reason for CRM: Patient calling to make a mammogram appointment. Could you assist? Patients callback number is [phone]

## 2024-01-03 NOTE — Telephone Encounter (Signed)
 LVM for patient to call the breast center to reschedule her mammogram. Breast center had reached out to the patient also.   (08/02/2023 canceled appointment)

## 2024-01-03 NOTE — Progress Notes (Signed)
 Janice Massey                                          MRN: [id number]   01/03/2024   The [facility] Quality Team Specialist reviewed this patient medical record for the purposes of chart review for care gap closure. The following were reviewed: chart review for care gap closure-kidney health evaluation for diabetes:eGFR  and uACR.    [facility]BCI Quality Team

## 2024-01-03 NOTE — Progress Notes (Signed)
 Attending Physician's Attestation   I have reviewed the chart.   I agree with the Advanced Practitioner's note, impression, and recommendations with any updates as below.    Corliss Parish, MD [facility] Advanced Endoscopy Office # [phone]

## 2024-01-03 NOTE — Progress Notes (Signed)
 "  Chief Complaint:colonoscopy Primary GI Doctor: Dr. Federico  HPI:  Patient is a  68  year old female patient with past medical history of SBO,hyperlipidemia, and GERD, who was referred to me by [facility] on 08/01/23 for a evaluation of colonoscopy.   11/14/23 seen by cardiology for atypical chest pain. Per note: She was evaluated by Dr. Alveta 08/07/2019 with a normal 2D echo and a normal perfusion study. She has had no recurrent chest pain. I suspect her pain was noncardiac. Reviewed entire note.  11/23/23 seen in ED for abdominal pain.Patient's lab work unremarkable. Patient with and area of focal enteritis on CT, no evidence of obstruction, no other acute findings.   Interval History Patient presents for evaluation of colon screening colonoscopy.  Patient has BM every other day. She is taking OTC Miralax  prn.  Denies abdominal pain or blood in stool.   Patient has history of GERD and currently taking  omeprazole  20 mg and Pepcid  20 mg po daily. She reports intermittent pyrosis. Denies chest discomfort. Denies dysphagia.  Patient denies nausea, vomiting, or weight loss.  Nonsmoker. No alcohol use.   Patient not on blood thinners.  Never had EGD. Virtual colonoscopy in 2020.  Reports History of SBO's between 2010-2019. She was told she was not surgical candidate. She does not see general surgeon as of recent.   Surgical history: full hysterectomy, cholecystectomy  Patient's family history includes: no esophageal CA, no colon CA.  Wt Readings from Last 3 Encounters:  01/03/24 174 lb (78.9 kg)  11/23/23 174 lb (78.9 kg)  11/14/23 175 lb (79.4 kg)    Past Medical History:  Diagnosis Date   Small bowel obstruction (HCC) 2013, 2015    Past Surgical History:  Procedure Laterality Date   ABDOMINAL HERNIA REPAIR     ABDOMINAL HYSTERECTOMY     [place]   BREAST BIOPSY Left 06/29/2023   MM LT BREAST BX W LOC DEV 1ST LESION IMAGE BX SPEC STEREO GUIDE 06/29/2023 [facility]  MAMMOGRAPHY   CHOLECYSTECTOMY     Left ankle surgery      Current Outpatient Medications  Medication Sig Dispense Refill   acetaminophen  (TYLENOL ) 500 MG tablet Take 500 mg by mouth every 6 (six) hours as needed.     ibuprofen  (ADVIL ) 400 MG tablet Take 1 tablet (400 mg total) by mouth every 6 (six) hours as needed (mild pain, fever >100.4). 30 tablet 0   Na Sulfate-K Sulfate-Mg Sulfate concentrate (SUPREP BOWEL PREP KIT) 17.5-3.13-1.6 GM/177ML SOLN Take 1 kit (354 mLs total) by mouth as directed. 324 mL 0   omeprazole  (PRILOSEC) 20 MG capsule Take 1 capsule (20 mg total) by mouth daily. 30 capsule 0   polyethylene glycol (MIRALAX ) 17 g packet Take 17 g by mouth 2 (two) times daily. 14 each 0   rosuvastatin (CRESTOR) 5 MG tablet Take 5 mg by mouth daily.     amoxicillin -clavulanate (AUGMENTIN ) 875-125 MG tablet Take 1 tablet by mouth every 12 (twelve) hours. (Patient not taking: Reported on 01/03/2024) 14 tablet 0   famotidine  (PEPCID ) 20 MG tablet Take 1 tablet (20 mg total) by mouth 2 (two) times daily. (Patient not taking: Reported on 01/03/2024) 30 tablet 0   No current facility-administered medications for this visit.    Allergies as of 01/03/2024   (No Known Allergies)    Family History  Problem Relation Age of Onset   Cancer Mother        Breast? but patient not sure  Diabetes Father    Diabetes Sister    Early death Brother 45       aneurysm   Heart disease Maternal Grandmother    Diabetes Paternal Grandfather     Review of Systems:    Constitutional: No weight loss, fever, chills, weakness or fatigue HEENT: Eyes: No change in vision               Ears, Nose, Throat:  No change in hearing or congestion Skin: No rash or itching Cardiovascular: No chest pain, chest pressure or palpitations   Respiratory: No SOB or cough Gastrointestinal: See HPI and otherwise negative Genitourinary: No dysuria or change in urinary frequency Neurological: No headache, dizziness or  syncope Musculoskeletal: No new muscle or joint pain Hematologic: No bleeding or bruising Psychiatric: No history of depression or anxiety    Physical Exam:  Vital signs: BP 122/70   Pulse 72   Ht 5' 4 (1.626 m)   Wt 174 lb (78.9 kg)   BMI 29.87 kg/m   Constitutional:   Pleasant  female appears to be in NAD, Well developed, Well nourished, alert and cooperative Eyes:   PEERL, EOMI. No icterus. Conjunctiva pink. Neck:  Supple Throat: Oral cavity and pharynx without inflammation, swelling or lesion.  Respiratory: Respirations even and unlabored. Lungs clear to auscultation bilaterally.   No wheezes, crackles, or rhonchi.  Cardiovascular: Normal S1, S2. Regular rate and rhythm. No peripheral edema, cyanosis or pallor.  Gastrointestinal:  Soft, nondistended, nontender. No rebound or guarding. Hypoactive bowel sounds. No appreciable masses or hepatomegaly. Rectal:  Not performed.  Msk:  Symmetrical without gross deformities. Without edema, no deformity or joint abnormality.  Neurologic:  Alert and  oriented x4;  grossly normal neurologically.  Skin:   Dry and intact without significant lesions or rashes.  RELEVANT LABS AND IMAGING: CBC    Latest Ref Rng & Units 11/23/2023    7:33 PM 08/09/2023    9:58 PM 02/15/2023    8:05 PM  CBC  WBC 4.0 - 10.5 K/uL 7.4  7.7  6.6   Hemoglobin 12.0 - 15.0 g/dL 87.0  87.9  87.8   Hematocrit 36.0 - 46.0 % 39.2  35.7  35.6   Platelets 150 - 400 K/uL 306  249  253      CMP     Latest Ref Rng & Units 11/23/2023    7:33 PM 11/14/2023   10:09 AM 08/09/2023    9:58 PM  CMP  Glucose 70 - 99 mg/dL 89   899   BUN 8 - 23 mg/dL 15   15   Creatinine 9.55 - 1.00 mg/dL 9.19   9.25   Sodium 864 - 145 mmol/L 141   138   Potassium 3.5 - 5.1 mmol/L 3.8   3.5   Chloride 98 - 111 mmol/L 105   105   CO2 22 - 32 mmol/L 24   23   Calcium 8.9 - 10.3 mg/dL 9.6   9.1   Total Protein 6.5 - 8.1 g/dL 7.8  6.9    Total Bilirubin 0.0 - 1.2 mg/dL 1.5  1.4    Alkaline  Phos 38 - 126 U/L 66  79    AST 15 - 41 U/L 18  16    ALT 0 - 44 U/L 18  11       Lab Results  Component Value Date   TSH 1.729 03/30/2020  08/2019 echo-  Left ventricular ejection fraction, by estimation, is 55 to  60%.   10/2018 CT virtual colonoscopy IMPRESSION: No significant colonic polyp, mass, apple core lesion, or stricture.  11/2023 CTAP IMPRESSION: 1. Single dilated loop of small bowel adhered to the anterior abdominal wall with mild small bowel wall thickening and mesenteric edema. Findings are compatible with focal enteritis. No bowel obstruction. 2. Anterior abdominal wall mesh with small fat containing ventral hernias. 3. Left Bosniak I benign renal cyst measuring 2.5 cm. No follow-up recommended.  02/2023 CTAP IMPRESSION: 1. Small hiatal hernia. 2. Mild to moderate severity thickening of the gastric antrum which Graeme Menees represent gastritis. 3. Fluid-filled small bowel loops within the mid left abdomen which Annasofia Pohl represent sequelae associated with mild enteritis. 4. Evidence of prior cholecystectomy and hysterectomy. 5. Small fat containing umbilical hernia. 6. Aortic atherosclerosis.  Assessment: Encounter Diagnoses  Name Primary?   Special screening for malignant neoplasms, colon Yes   Chronic constipation    History of small bowel obstruction    Gastroesophageal reflux disease, unspecified whether esophagitis present     68 year old female patient that presents for colon screening colonoscopy.  Reports she had virtual colonoscopy in 2020 that was normal.  Since then patient has had intermittent episodes of abdominal pain that resulted in ED visits.  Today she denies any abdominal pain.  Patient does have chronic constipation and uses MiraLAX  as needed.  Reinforced low residue diet as well as establishing care with a general surgeon for history of recurrent small bowel obstruction.  Will go ahead and schedule colonoscopy in [facility] with Dr. Federico with 2-day prep.     Patient also has chronic GERD with intermittent pyrosis.  Recommended the patient have upper GI endoscopy to evaluate for esophagitis and/or Barrett's, patient declined.  Plan: -Continue omeprazole   20 mg po daily -continue famotidine  20 mg po daily  -Recommend GERD diet, no late meals -recommend EGD, declined -continue miralax  po QD - recommend low residue diet -[facility] referral for hx of recurrent SBO -Schedule for a colonoscopy with 2 day prep in [facility] with Dr. Federico. The risks and benefits of colonoscopy with possible polypectomy / biopsies were discussed and the patient agrees to proceed.   Thank you for the courtesy of this consult. Please call me with any questions or concerns.   Anagha Loseke, FNP-C [facility] 01/03/2024, 12:30 PM  Cc: [facility], [address]  "

## 2024-01-03 NOTE — Patient Instructions (Addendum)
 History of small bowel obstructions Recommend low residue diet Recommend seeing general surgeon and establishing care in case of urgent symptoms  GERD Continue omeprazole   20 mg po daily continue famotidine  20 mg po daily  Recommend GERD diet, no late meals  Constipation Continue Miralax  po daily   _______________________________________________________  If your blood pressure at your visit was 140/90 or greater, please contact your primary care physician to follow up on this.  _______________________________________________________  If you are age 52 or older, your body mass index should be between 23-30. Your Body mass index is 29.87 kg/m. If this is out of the aforementioned range listed, please consider follow up with your Primary Care Provider.  If you are age 72 or younger, your body mass index should be between 19-25. Your Body mass index is 29.87 kg/m. If this is out of the aformentioned range listed, please consider follow up with your Primary Care Provider.   ________________________________________________________  The [facility] providers would like to encourage you to use MYCHART to communicate with providers for non-urgent requests or questions.  Due to long hold times on the telephone, sending your provider a message by [facility] may be a faster and more efficient way to get a response.  Please allow 48 business hours for a response.  Please remember that this is for non-urgent requests.  _______________________________________________________  [facility] is using a team-based approach to care.  Your team is made up of your doctor and two to three APPS. Our APPS (Nurse Practitioners and Physician Assistants) work with your physician to ensure care continuity for you. They are fully qualified to address your health concerns and develop a treatment plan. They communicate directly with your gastroenterologist to care for you. Seeing the Advanced Practice  Practitioners on your physician's team can help you by facilitating care more promptly, often allowing for earlier appointments, access to diagnostic testing, procedures, and other specialty referrals.   You have been scheduled for a colonoscopy. Please follow written instructions given to you at your visit today.   If you use inhalers (even only as needed), please bring them with you on the day of your procedure.  DO NOT TAKE 7 DAYS PRIOR TO TEST- Trulicity (dulaglutide) Ozempic, Wegovy (semaglutide) Mounjaro, Zepbound (tirzepatide) Bydureon Bcise (exanatide extended release)  DO NOT TAKE 1 DAY PRIOR TO YOUR TEST Rybelsus (semaglutide) Adlyxin (lixisenatide) Victoza (liraglutide) Byetta (exanatide) ___________________________________________________________________________   Rosine have been scheduled for an appointment with _________ at [facility]. Your appointment is on ______ at ______. Please arrive at ______- for registration. Make certain to bring a list of current medications, including any over the counter medications or vitamins. Also bring your co-pay if you have one as well as your insurance cards. [facility] is located at [address], [address]. Should you need to reschedule your appointment, please contact them at [phone].  Due to recent changes in healthcare laws, you may see the results of your imaging and laboratory studies on MyChart before your provider has had a chance to review them.  We understand that in some cases there may be results that are confusing or concerning to you. Not all laboratory results come back in the same time frame and the provider may be waiting for multiple results in order to interpret others.  Please give us  48 hours in order for your provider to thoroughly review all the results before contacting the office for clarification of your results.

## 2024-01-11 ENCOUNTER — Encounter: Payer: Self-pay | Admitting: Podiatry

## 2024-01-11 ENCOUNTER — Ambulatory Visit: Admitting: Podiatry

## 2024-01-11 ENCOUNTER — Telehealth: Payer: Self-pay | Admitting: *Deleted

## 2024-01-11 DIAGNOSIS — E1129 Type 2 diabetes mellitus with other diabetic kidney complication: Secondary | ICD-10-CM

## 2024-01-11 DIAGNOSIS — Z0189 Encounter for other specified special examinations: Secondary | ICD-10-CM

## 2024-01-11 DIAGNOSIS — M79674 Pain in right toe(s): Secondary | ICD-10-CM | POA: Diagnosis not present

## 2024-01-11 DIAGNOSIS — M79675 Pain in left toe(s): Secondary | ICD-10-CM

## 2024-01-11 DIAGNOSIS — E119 Type 2 diabetes mellitus without complications: Secondary | ICD-10-CM

## 2024-01-11 DIAGNOSIS — B351 Tinea unguium: Secondary | ICD-10-CM

## 2024-01-11 DIAGNOSIS — S9032XD Contusion of left foot, subsequent encounter: Secondary | ICD-10-CM

## 2024-01-11 DIAGNOSIS — R809 Proteinuria, unspecified: Secondary | ICD-10-CM | POA: Diagnosis not present

## 2024-01-11 NOTE — Progress Notes (Signed)
"  °  Subjective:  Patient ID: Janice Massey, female    DOB: Jan 06, 1955,   MRN: [id number]  Chief Complaint  Patient presents with   Foot Pain    It's good.    69 y.o. female presents for low up of left foot injury relates she is doing much better not having any pain in the left foot anymore. Also concern of thickened elongated and painful nails that are difficult to trim. Requesting to have them trimmed today. Relates burning and tingling in their feet. Patient is diabetic and last A1c was  Lab Results  Component Value Date   HGBA1C 7.0 (A) 08/10/2023   .   PCP:  Shawn Sick, MD   . Denies any other pedal complaints. Denies n/v/f/c.   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    Hypertension    Mute    Obese    URI (upper respiratory infection)    Ventricular septal defect 2004 per echo   small membranous VSD    Objective:  Physical Exam: Vascular: DP/PT pulses 2/4 bilateral. CFT <3 seconds. Feet cold to touch. Absent hair growth on digits. Edema noted to bilateral lower extremities. Xerosis noted bilaterally.  Skin. No lacerations or abrasions bilateral feet. Nails 1-5 bilateral  are thickened discolored and elongated with subungual debris.  Musculoskeletal: MMT 5/5 bilateral lower extremities in DF, PF, Inversion and Eversion. Deceased ROM in DF of ankle joint.  No pain to palpation anywhere about the left foot. Neurological: Sensation intact to light touch. Protective sensation diminished bilateral.    Assessment:   1. Pain due to onychomycosis of toenails of both feet   2. Type  2 diabetes mellitus with diabetic microalbuminuria, without long-term current use of insulin  (HCC)   3. Encounter for diabetic foot exam (HCC)   4. Contusion of left foot, subsequent encounter      Plan:  Patient was evaluated and treated and all questions answered. -Xrays reviewed.  No acute fractures or dislocations noted.  Mild edema noted to the dorsum of the left foot. -Discussed treatement options for contusion of the foot; risks, alternatives, and benefits explained. May return to regular shoes as tolerated seems to be healed.  -Discussed and educated patient on diabetic foot care, especially with  regards to the vascular, neurological and musculoskeletal systems.  -Stressed the importance of good glycemic control and the detriment of not  controlling glucose levels in relation to the foot. -Discussed supportive shoes at all times and checking feet regularly.  -Mechanically debrided all nails 1-5 bilateral using sterile nail nipper and filed with dremel without incident  -Answered all patient questions -Patient to return  in 3 months for at risk foot care -Patient advised to call the office if any problems or questions arise in the meantime.    Asberry Failing, DPM    "

## 2024-01-11 NOTE — Telephone Encounter (Signed)
 Mammogram appointment 01/29/2024 / 2:30 pm to arrive 2:15 pm. Attached to th appointment is the information regarding the 75.00 no show fee.  Appointment mailed to the patient.

## 2024-01-16 ENCOUNTER — Telehealth: Payer: Self-pay | Admitting: Internal Medicine

## 2024-01-16 NOTE — Telephone Encounter (Signed)
 Good afternoon Dr. Federico,   Patient wished to rescheduled her colonoscopy for tomorrow 1/13 due to her daughter having the flu and does not have anyone to stay with her. Patient rescheduled to 1/22. Please advise.   Thank you

## 2024-01-17 ENCOUNTER — Encounter: Admitting: Internal Medicine

## 2024-01-23 ENCOUNTER — Encounter (HOSPITAL_COMMUNITY): Payer: Self-pay

## 2024-01-23 ENCOUNTER — Ambulatory Visit (HOSPITAL_COMMUNITY)
Admission: EM | Admit: 2024-01-23 | Discharge: 2024-01-23 | Disposition: A | Discharge status: Home / Self Care | Attending: Nurse Practitioner | Admitting: Nurse Practitioner

## 2024-01-23 DIAGNOSIS — J069 Acute upper respiratory infection, unspecified: Secondary | ICD-10-CM | POA: Diagnosis not present

## 2024-01-23 MED ORDER — MUCINEX DM MAXIMUM STRENGTH 60-1200 MG PO TB12: 1.0000 | ORAL_TABLET | Freq: Two times a day (BID) | ORAL | 0 refills | Status: AC

## 2024-01-23 MED ORDER — BENZONATATE 200 MG PO CAPS: 200.0000 mg | ORAL_CAPSULE | Freq: Three times a day (TID) | ORAL | 0 refills | Status: AC | PRN

## 2024-01-23 MED ORDER — PREDNISONE 20 MG PO TABS: 40.0000 mg | ORAL_TABLET | Freq: Every day | ORAL | 0 refills | Status: AC

## 2024-01-23 NOTE — Discharge Instructions (Addendum)
 Your symptoms are most consistent with a viral upper respiratory infection involving the nose, throat, or lungs, and antibiotics are not needed since they only treat bacterial infections. Please take any prescribed medications as directed. You may also use over-the-counter medications such as acetaminophen  or ibuprofen  for fever, body aches, or throat discomfort. Saline nasal spray or rinses can be used to help relieve congestion and clear mucus, and sore throat symptoms may improve with lozenges, warm saltwater gargles, or steam from a warm shower. Staying well hydrated is important; drink plenty of fluids and aim for pale yellow urine. A cool mist humidifier, elevating your head when sleeping, and resting can also help you feel more comfortable while your body recovers. Replace your toothbrush once symptoms begin to improve.  A cough may linger for several weeks after other symptoms resolve, which is normal as long as it is gradually improving. Seek emergency care if symptoms worsen, you develop difficulty breathing, chest pain, a new or high fever, cough up blood, or are unable to keep fluids down.

## 2024-01-23 NOTE — ED Provider Notes (Signed)
 " [facility]    CSN: [id number] Arrival date & time: 01/23/24  9041      History   Chief Complaint Chief Complaint  Patient presents with   Cough   Headache    HPI Brandi Mendez is a 69 y.o. female.   Discussed the use of AI scribe software for clinical note transcription with the patient, who gave verbal consent to proceed.   Brandi Mendez presents with cough, headache, chest soreness, and dizziness for the past week. She reports subjective fevers with associated sweating but has not checked her temperature. She initially had nasal congestion and runny nose which resolved after her daughter gave her Mucinex , but she discontinued the medication as she felt it was not helping and she was feeling worse overall. She describes a sore throat and chest soreness attributed to frequent coughing. She denies nausea, vomiting, or diarrhea. She takes medications for acid reflux and cholesterol and reports no known allergies.  The following sections of the patient's history were reviewed and updated as appropriate: allergies, current medications, past family history, past medical history, past social history, past surgical history, and problem list.      Past Medical History:  Diagnosis Date   Small bowel obstruction (HCC) 2013, 2015    Patient Active Problem List   Diagnosis Date Noted   Partial bowel obstruction (HCC) 05/17/2022   Pruritus 02/08/2022   Abdominal pain 03/29/2020   History of small bowel obstruction    Diarrhea    Cough 06/10/2019   Left shoulder pain 06/10/2019   Otalgia of right ear 01/31/2019   Vision changes 01/31/2019   Chest pain of uncertain etiology 10/05/2012   Hypotension 10/05/2012   HYPERTRIGLYCERIDEMIA, MILD 12/26/2006   OBESITY, UNSPECIFIED 12/26/2006    Past Surgical History:  Procedure Laterality Date   ABDOMINAL HERNIA REPAIR     ABDOMINAL HYSTERECTOMY     [place]   BREAST BIOPSY Left 06/29/2023   MM LT BREAST BX W LOC DEV 1ST  LESION IMAGE BX SPEC STEREO GUIDE 06/29/2023 [facility]   CHOLECYSTECTOMY     Left ankle surgery      OB History   No obstetric history on file.      Home Medications    Prior to Admission medications  Medication Sig Start Date End Date Taking? Authorizing Provider  benzonatate  (TESSALON ) 200 MG capsule Take 1 capsule (200 mg total) by mouth 3 (three) times daily as needed for cough. 01/23/24  Yes Iola Lukes, FNP  Dextromethorphan-guaiFENesin  (MUCINEX  DM MAXIMUM STRENGTH) 60-1200 MG TB12 Take 1 tablet by mouth 2 (two) times daily. 01/23/24  Yes Xavior Niazi, FNP  predniSONE  (DELTASONE ) 20 MG tablet Take 2 tablets (40 mg total) by mouth daily for 5 days. 01/23/24 01/28/24 Yes Iola Lukes, FNP  acetaminophen  (TYLENOL ) 500 MG tablet Take 500 mg by mouth every 6 (six) hours as needed.    [provider]  ibuprofen  (ADVIL ) 400 MG tablet Take 1 tablet (400 mg total) by mouth every 6 (six) hours as needed (mild pain, fever >100.4). 05/18/22   Dameron, Marisa, DO  omeprazole  (PRILOSEC) 20 MG capsule Take 1 capsule (20 mg total) by mouth daily. 08/10/23   Robinson, John K, PA-C  polyethylene glycol (MIRALAX ) 17 g packet Take 17 g by mouth 2 (two) times daily. 05/18/22   Elicia Hamlet, MD  rosuvastatin (CRESTOR) 5 MG tablet Take 5 mg by mouth daily. 11/02/23   [provider]  fluticasone  (FLONASE ) 50 MCG/ACT nasal spray  Place 2 sprays into both nostrils daily. 01/24/19 08/07/19  Halbert Mariano SQUIBB, DO    Family History Family History  Problem Relation Age of Onset   Cancer Mother        Breast? but patient not sure   Diabetes Father    Diabetes Sister    Early death Brother 3       aneurysm   Heart disease Maternal Grandmother    Diabetes Paternal Grandfather     Social History Social History[1]   Allergies   Patient has no known allergies.   Review of Systems Review of Systems  Constitutional:  Positive for diaphoresis and fever.  HENT:   Positive for sore throat. Negative for rhinorrhea.   Respiratory:  Positive for cough. Negative for shortness of breath and wheezing.   Gastrointestinal:  Negative for diarrhea, nausea and vomiting.  Neurological:  Positive for dizziness and headaches.  All other systems reviewed and are negative.    Physical Exam Triage Vital Signs ED Triage Vitals  Encounter Vitals Group     BP 01/23/24 1139 116/77     Girls Systolic BP Percentile --      Girls Diastolic BP Percentile --      Boys Systolic BP Percentile --      Boys Diastolic BP Percentile --      Pulse Rate 01/23/24 1139 82     Resp 01/23/24 1139 17     Temp 01/23/24 1139 99.2 F (37.3 C)     Temp Source 01/23/24 1139 Oral     SpO2 01/23/24 1139 95 %     Weight --      Height --      Head Circumference --      Peak Flow --      Pain Score 01/23/24 1136 5     Pain Loc --      Pain Education --      Exclude from Growth Chart --    No data found.  Updated Vital Signs BP 116/77 (BP Location: Left Arm)   Pulse 82   Temp 99.2 F (37.3 C) (Oral)   Resp 17   SpO2 95%   Visual Acuity Right Eye Distance:   Left Eye Distance:   Bilateral Distance:    Right Eye Near:   Left Eye Near:    Bilateral Near:     Physical Exam Vitals reviewed.  Constitutional:      General: She is awake. She is not in acute distress.    Appearance: Normal appearance. She is well-developed. She is not ill-appearing, toxic-appearing or diaphoretic.  HENT:     Head: Normocephalic.     Right Ear: Hearing, tympanic membrane, ear canal and external ear normal. No drainage, swelling or tenderness. No middle ear effusion. Tympanic membrane is not erythematous.     Left Ear: Hearing, tympanic membrane, ear canal and external ear normal. No drainage, swelling or tenderness.  No middle ear effusion. Tympanic membrane is not erythematous.     Nose: Nose normal.     Mouth/Throat:     Lips: Pink.     Mouth: Mucous membranes are moist.     Pharynx:  Oropharynx is clear. Uvula midline. No pharyngeal swelling, oropharyngeal exudate, posterior oropharyngeal erythema or uvula swelling.     Tonsils: No tonsillar exudate or tonsillar abscesses.  Eyes:     General: Vision grossly intact.     Conjunctiva/sclera: Conjunctivae normal.  Cardiovascular:     Rate and Rhythm: Normal rate  and regular rhythm.     Heart sounds: Normal heart sounds.  Pulmonary:     Effort: Pulmonary effort is normal. No tachypnea or respiratory distress.     Breath sounds: Normal breath sounds and air entry.     Comments: Respirations even and unlabored  Musculoskeletal:        General: Normal range of motion.     Cervical back: Full passive range of motion without pain, normal range of motion and neck supple.     Right lower leg: No edema.     Left lower leg: No edema.  Lymphadenopathy:     Cervical: No cervical adenopathy.  Skin:    General: Skin is warm and dry.  Neurological:     General: No focal deficit present.     Mental Status: She is alert and oriented to person, place, and time.  Psychiatric:        Speech: Speech normal.        Behavior: Behavior is cooperative.      UC Treatments / Results  Labs (all labs ordered are listed, but only abnormal results are displayed) Labs Reviewed - No data to display  EKG   Radiology No results found.  Procedures Procedures (including critical care time)  Medications Ordered in UC Medications - No data to display  Initial Impression / Assessment and Plan / UC Course  I have reviewed the triage vital signs and the nursing notes.  Pertinent labs & imaging results that were available during my care of the patient were reviewed by me and considered in my medical decision making (see chart for details).     The patient presents with symptoms consistent with a viral upper respiratory infection. Exam is reassuring and no evidence of bacterial infection or acute cardiopulmonary process is noted.  Supportive care is recommended. Patient was advised to follow up with primary care if symptoms do not improve within one week or if new concerns arise. Instructions were given to seek emergency care if symptoms worsen, including shortness of breath, chest pain, persistent high fever, inability to tolerate fluids, or confusion.  Today's evaluation has revealed no signs of a dangerous process. Discussed diagnosis with patient and/or guardian. Patient and/or guardian aware of their diagnosis, possible red flag symptoms to watch out for and need for close follow up. Patient and/or guardian understands verbal and written discharge instructions. Patient and/or guardian comfortable with plan and disposition.  Patient and/or guardian has a clear mental status at this time, good insight into illness (after discussion and teaching) and has clear judgment to make decisions regarding their care  Documentation was completed with the aid of voice recognition software. Transcription may contain typographical errors.   Final Clinical Impressions(s) / UC Diagnoses   Final diagnoses:  Viral upper respiratory tract infection     Discharge Instructions      Your symptoms are most consistent with a viral upper respiratory infection involving the nose, throat, or lungs, and antibiotics are not needed since they only treat bacterial infections. Please take any prescribed medications as directed. You may also use over-the-counter medications such as acetaminophen  or ibuprofen  for fever, body aches, or throat discomfort. Saline nasal spray or rinses can be used to help relieve congestion and clear mucus, and sore throat symptoms may improve with lozenges, warm saltwater gargles, or steam from a warm shower. Staying well hydrated is important; drink plenty of fluids and aim for pale yellow urine. A cool mist humidifier, elevating your head when  sleeping, and resting can also help you feel more comfortable while your body  recovers. Replace your toothbrush once symptoms begin to improve.  A cough may linger for several weeks after other symptoms resolve, which is normal as long as it is gradually improving. Seek emergency care if symptoms worsen, you develop difficulty breathing, chest pain, a new or high fever, cough up blood, or are unable to keep fluids down.            ED Prescriptions     Medication Sig Dispense Auth. Provider   Dextromethorphan-guaiFENesin  (MUCINEX  DM MAXIMUM STRENGTH) 60-1200 MG TB12 Take 1 tablet by mouth 2 (two) times daily. 20 tablet Iola Lukes, FNP   benzonatate  (TESSALON ) 200 MG capsule Take 1 capsule (200 mg total) by mouth 3 (three) times daily as needed for cough. 30 capsule Iola Lukes, FNP   predniSONE  (DELTASONE ) 20 MG tablet Take 2 tablets (40 mg total) by mouth daily for 5 days. 10 tablet Iola Lukes, FNP      PDMP not reviewed this encounter.     [1]  Social History Tobacco Use   Smoking status: Never    Passive exposure: Never   Smokeless tobacco: Never  Vaping Use   Vaping status: Never Used  Substance Use Topics   Alcohol use: Yes    Comment: rarely   Drug use: No     Iola Lukes, FNP 01/23/24 1337  "

## 2024-01-23 NOTE — ED Triage Notes (Signed)
 Pt has c/o headache, cough, sore chest, dizzy and headache x 1 week. Pt has been taking mucinex  at home with no relief. Last dose of mucinex  yesterday morning.

## 2024-01-24 ENCOUNTER — Telehealth: Payer: Self-pay | Admitting: Internal Medicine

## 2024-01-24 NOTE — Telephone Encounter (Signed)
 Good afternoon Dr. Federico,    I received a call from this patient stating that she is having to reschedule her appointment set for January the 22 nd due to her coming down with a respiratory infection and was placed on Antibiotics. Patient has been rescheduled for February the 20 th and scheduled for a pre-visit with the nurse on February the 6 th. Please advise.     Thank you.

## 2024-01-26 ENCOUNTER — Encounter: Admitting: Internal Medicine

## 2024-01-27 ENCOUNTER — Other Ambulatory Visit

## 2024-02-01 ENCOUNTER — Ambulatory Visit

## 2024-02-04 ENCOUNTER — Encounter (HOSPITAL_COMMUNITY): Payer: Self-pay

## 2024-02-04 ENCOUNTER — Observation Stay (HOSPITAL_COMMUNITY)
Admission: EM | Admit: 2024-02-04 | Discharge: 2024-02-06 | Disposition: A | Attending: Family Medicine | Admitting: Family Medicine

## 2024-02-04 ENCOUNTER — Emergency Department (HOSPITAL_COMMUNITY)

## 2024-02-04 ENCOUNTER — Other Ambulatory Visit: Payer: Self-pay

## 2024-02-04 DIAGNOSIS — Z79899 Other long term (current) drug therapy: Secondary | ICD-10-CM | POA: Insufficient documentation

## 2024-02-04 DIAGNOSIS — R42 Dizziness and giddiness: Secondary | ICD-10-CM | POA: Insufficient documentation

## 2024-02-04 DIAGNOSIS — E785 Hyperlipidemia, unspecified: Secondary | ICD-10-CM | POA: Insufficient documentation

## 2024-02-04 DIAGNOSIS — E876 Hypokalemia: Secondary | ICD-10-CM | POA: Diagnosis not present

## 2024-02-04 DIAGNOSIS — H9190 Unspecified hearing loss, unspecified ear: Secondary | ICD-10-CM | POA: Insufficient documentation

## 2024-02-04 DIAGNOSIS — E119 Type 2 diabetes mellitus without complications: Secondary | ICD-10-CM | POA: Insufficient documentation

## 2024-02-04 DIAGNOSIS — R531 Weakness: Secondary | ICD-10-CM | POA: Insufficient documentation

## 2024-02-04 DIAGNOSIS — R079 Chest pain, unspecified: Principal | ICD-10-CM | POA: Diagnosis present

## 2024-02-04 DIAGNOSIS — K5900 Constipation, unspecified: Secondary | ICD-10-CM | POA: Insufficient documentation

## 2024-02-04 DIAGNOSIS — K219 Gastro-esophageal reflux disease without esophagitis: Secondary | ICD-10-CM | POA: Insufficient documentation

## 2024-02-04 DIAGNOSIS — I10 Essential (primary) hypertension: Secondary | ICD-10-CM | POA: Insufficient documentation

## 2024-02-04 LAB — URINALYSIS, ROUTINE W REFLEX MICROSCOPIC
Bacteria, UA: NONE SEEN
Bilirubin Urine: NEGATIVE
Glucose, UA: >=500 mg/dL — AB
Hgb urine dipstick: NEGATIVE
Ketones, ur: NEGATIVE mg/dL
Leukocytes,Ua: NEGATIVE
Nitrite: NEGATIVE
Protein, ur: NEGATIVE mg/dL
Specific Gravity, Urine: 1 — ABNORMAL LOW (ref 1.005–1.030)
pH: 6 (ref 5.0–8.0)

## 2024-02-04 LAB — CBC
HCT: 39.2 % (ref 36.0–46.0)
Hemoglobin: 12.6 g/dL (ref 12.0–15.0)
MCH: 29.6 pg (ref 26.0–34.0)
MCHC: 32.1 g/dL (ref 30.0–36.0)
MCV: 92 fL (ref 80.0–100.0)
Platelets: 236 10*3/uL (ref 150–400)
RBC: 4.26 MIL/uL (ref 3.87–5.11)
RDW: 13.2 % (ref 11.5–15.5)
WBC: 7.7 10*3/uL (ref 4.0–10.5)
nRBC: 0 % (ref 0.0–0.2)

## 2024-02-04 LAB — COMPREHENSIVE METABOLIC PANEL WITH GFR
ALT: 18 U/L (ref 0–44)
AST: 31 U/L (ref 15–41)
Albumin: 4.5 g/dL (ref 3.5–5.0)
Alkaline Phosphatase: 92 U/L (ref 38–126)
Anion gap: 11 (ref 5–15)
BUN: 7 mg/dL — ABNORMAL LOW (ref 8–23)
CO2: 28 mmol/L (ref 22–32)
Calcium: 9.4 mg/dL (ref 8.9–10.3)
Chloride: 92 mmol/L — ABNORMAL LOW (ref 98–111)
Creatinine, Ser: 0.94 mg/dL (ref 0.44–1.00)
GFR, Estimated: >60 mL/min
Glucose, Bld: 141 mg/dL — ABNORMAL HIGH (ref 70–99)
Potassium: 2.7 mmol/L — CL (ref 3.5–5.1)
Sodium: 131 mmol/L — ABNORMAL LOW (ref 135–145)
Total Bilirubin: 1.2 mg/dL (ref 0.0–1.2)
Total Protein: 7.2 g/dL (ref 6.5–8.1)

## 2024-02-04 LAB — CBG MONITORING, ED
Glucose-Capillary: 134 mg/dL — ABNORMAL HIGH (ref 70–99)
Glucose-Capillary: 98 mg/dL (ref 70–99)
Glucose-Capillary: 103 mg/dL — ABNORMAL HIGH (ref 70–99)

## 2024-02-04 LAB — BASIC METABOLIC PANEL WITH GFR
Anion gap: 8 (ref 5–15)
BUN: 6 mg/dL — ABNORMAL LOW (ref 8–23)
CO2: 28 mmol/L (ref 22–32)
Calcium: 9.2 mg/dL (ref 8.9–10.3)
Chloride: 105 mmol/L (ref 98–111)
Creatinine, Ser: 0.82 mg/dL (ref 0.44–1.00)
GFR, Estimated: >60 mL/min
Glucose, Bld: 179 mg/dL — ABNORMAL HIGH (ref 70–99)
Potassium: 3.8 mmol/L (ref 3.5–5.1)
Sodium: 141 mmol/L (ref 135–145)

## 2024-02-04 LAB — TROPONIN T, HIGH SENSITIVITY
Troponin T High Sensitivity: 19 ng/L (ref 0–19)
Troponin T High Sensitivity: 19 ng/L (ref 0–19)

## 2024-02-04 LAB — GLUCOSE, CAPILLARY
Glucose-Capillary: 154 mg/dL — ABNORMAL HIGH (ref 70–99)
Glucose-Capillary: 136 mg/dL — ABNORMAL HIGH (ref 70–99)

## 2024-02-04 LAB — MAGNESIUM: Magnesium: 2.1 mg/dL (ref 1.7–2.4)

## 2024-02-04 MED ORDER — SODIUM CHLORIDE 0.9 % IV BOLUS
500.0000 mL | Freq: Once | INTRAVENOUS | Status: AC
  Administered 2024-02-04: 500 mL via INTRAVENOUS

## 2024-02-04 MED ORDER — ATORVASTATIN CALCIUM 40 MG PO TABS
40.0000 mg | ORAL_TABLET | Freq: Every day | ORAL | Status: DC
  Administered 2024-02-04 – 2024-02-05 (×2): 40 mg via ORAL
  Filled 2024-02-04 (×2): qty 1

## 2024-02-04 MED ORDER — POTASSIUM CHLORIDE CRYS ER 20 MEQ PO TBCR
40.0000 meq | EXTENDED_RELEASE_TABLET | Freq: Once | ORAL | Status: AC
  Administered 2024-02-04: 40 meq via ORAL
  Filled 2024-02-04: qty 2

## 2024-02-04 MED ORDER — LOSARTAN POTASSIUM 50 MG PO TABS
100.0000 mg | ORAL_TABLET | Freq: Every day | ORAL | Status: DC
  Administered 2024-02-04 – 2024-02-06 (×3): 100 mg via ORAL
  Filled 2024-02-04 (×3): qty 2

## 2024-02-04 MED ORDER — INSULIN ASPART 100 UNIT/ML IJ SOLN
0.0000 [IU] | Freq: Three times a day (TID) | INTRAMUSCULAR | Status: DC
  Administered 2024-02-04 – 2024-02-05 (×2): 3 [IU] via SUBCUTANEOUS
  Administered 2024-02-06: 2 [IU] via SUBCUTANEOUS
  Filled 2024-02-04 (×2): qty 3
  Filled 2024-02-04: qty 2

## 2024-02-04 MED ORDER — MAGNESIUM SULFATE 2 GM/50ML IV SOLN
2.0000 g | Freq: Once | INTRAVENOUS | Status: AC
  Administered 2024-02-04: 2 g via INTRAVENOUS
  Filled 2024-02-04: qty 50

## 2024-02-04 MED ORDER — ENOXAPARIN SODIUM 40 MG/0.4ML IJ SOSY
40.0000 mg | PREFILLED_SYRINGE | Freq: Every day | INTRAMUSCULAR | Status: DC
  Administered 2024-02-04 – 2024-02-06 (×3): 40 mg via SUBCUTANEOUS
  Filled 2024-02-04 (×3): qty 0.4

## 2024-02-04 MED ORDER — IOHEXOL 350 MG/ML SOLN
75.0000 mL | Freq: Once | INTRAVENOUS | Status: AC | PRN
  Administered 2024-02-04: 75 mL via INTRAVENOUS

## 2024-02-04 MED ORDER — OXYCODONE HCL 5 MG PO TABS
5.0000 mg | ORAL_TABLET | ORAL | Status: DC | PRN
  Administered 2024-02-04: 5 mg via ORAL
  Filled 2024-02-04: qty 1

## 2024-02-04 MED ORDER — HYDROMORPHONE HCL 1 MG/ML IJ SOLN: 0.5000 mg | INTRAMUSCULAR | Status: DC | PRN

## 2024-02-04 MED ORDER — LOSARTAN POTASSIUM-HCTZ 100-25 MG PO TABS: 1.0000 | ORAL_TABLET | Freq: Every day | ORAL | Status: DC

## 2024-02-04 MED ORDER — INSULIN ASPART 100 UNIT/ML IJ SOLN: 0.0000 [IU] | Freq: Every day | INTRAMUSCULAR | Status: DC

## 2024-02-04 MED ORDER — LORATADINE 10 MG PO TABS
10.0000 mg | ORAL_TABLET | Freq: Every day | ORAL | Status: DC
  Administered 2024-02-04 – 2024-02-06 (×3): 10 mg via ORAL
  Filled 2024-02-04 (×3): qty 1

## 2024-02-04 MED ORDER — PANTOPRAZOLE SODIUM 40 MG PO TBEC
40.0000 mg | DELAYED_RELEASE_TABLET | Freq: Every day | ORAL | Status: DC
  Administered 2024-02-04 – 2024-02-06 (×3): 40 mg via ORAL
  Filled 2024-02-04 (×3): qty 1

## 2024-02-04 MED ORDER — ONDANSETRON HCL 4 MG/2ML IJ SOLN: 4.0000 mg | Freq: Four times a day (QID) | INTRAMUSCULAR | Status: DC | PRN

## 2024-02-04 MED ORDER — ALBUTEROL SULFATE (2.5 MG/3ML) 0.083% IN NEBU: 2.5000 mg | INHALATION_SOLUTION | RESPIRATORY_TRACT | Status: DC | PRN

## 2024-02-04 MED ORDER — HYDROCHLOROTHIAZIDE 25 MG PO TABS
25.0000 mg | ORAL_TABLET | Freq: Every day | ORAL | Status: DC
  Administered 2024-02-04 – 2024-02-06 (×3): 25 mg via ORAL
  Filled 2024-02-04 (×3): qty 1

## 2024-02-04 MED ORDER — TRAZODONE HCL 50 MG PO TABS
25.0000 mg | ORAL_TABLET | Freq: Every evening | ORAL | Status: DC | PRN
  Administered 2024-02-05: 25 mg via ORAL
  Filled 2024-02-04: qty 1

## 2024-02-04 MED ORDER — ONDANSETRON HCL 4 MG PO TABS: 4.0000 mg | ORAL_TABLET | Freq: Four times a day (QID) | ORAL | Status: DC | PRN

## 2024-02-04 MED ORDER — ACETAMINOPHEN 650 MG RE SUPP: 650.0000 mg | Freq: Four times a day (QID) | RECTAL | Status: DC | PRN

## 2024-02-04 MED ORDER — ACETAMINOPHEN 325 MG PO TABS
650.0000 mg | ORAL_TABLET | Freq: Four times a day (QID) | ORAL | Status: DC | PRN
  Administered 2024-02-04: 650 mg via ORAL
  Filled 2024-02-04: qty 2

## 2024-02-04 MED ORDER — POTASSIUM CHLORIDE 10 MEQ/100ML IV SOLN
10.0000 meq | INTRAVENOUS | Status: AC
  Administered 2024-02-04 (×3): 10 meq via INTRAVENOUS
  Filled 2024-02-04 (×3): qty 100

## 2024-02-04 NOTE — ED Notes (Signed)
 Patient ambulated to restroom and on return placed back on monitoring equipment.

## 2024-02-04 NOTE — ED Notes (Signed)
 Patient ambulated to restroom.

## 2024-02-04 NOTE — H&P (Signed)
 " History and Physical  Janice Massey FMW:[id number] DOB: 1955/01/19 DOA: 02/04/2024  PCP: Shawn Sick, MD   Chief Complaint: High blood pressure, diarrhea  HPI: Janice Massey is a 69 y.o. female with medical history significant for hypertension, small stable VSD, type 2 diabetes, deafness being admitted to the hospital with generalized weakness, abdominal/chest discomfort, diarrhea and hypokalemia.  History is provided by the patient, she was seen with the assistance of electronic ASL interpreter Beka [phone].  Patient states she was in her usual state of health until last evening, when she started having some upper abdominal and lower chest discomfort, like a crampy gassy bloating discomfort.  She was also feeling generally weak and a little bit dizzy.  She denies any frank chest pain, or pleurisy.  No cough, shortness of breath, or fevers.  She then had 4 bouts of diarrhea and then felt even weaker.  She came to the hospital for evaluation where she was found to have potassium 2.7.  Currently, she has a small amount of abdominal cramping in the lower abdomen, but denies any chest pain, and is no longer having any nausea.  Review of Systems: Please see HPI for pertinent positives and negatives. A complete 10 system review of systems are otherwise negative.  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    Hypertension    Mute    Obese    URI (upper respiratory infection)    Ventricular septal defect 2004 per echo   small membranous VSD   Past Surgical History:  Procedure Laterality Date   CARDIAC  CATHETERIZATION  08/23/95   CHOLECYSTECTOMY  11/04/1993   COLPOSCOPY     ENDOMETRIAL BIOPSY  02/29/2020       TUBAL LIGATION  10/05/1978   Social History:  reports that she has never smoked. She has never used smokeless tobacco. She reports that she does not drink alcohol and does not use drugs.  Allergies[1]  Family History  Problem Relation Age of Onset   Cancer Mother        liver   Heart attack Mother 6   Hypertension Father    Diabetes Father    Heart attack Father    CAD Father 74   Diabetes Paternal Aunt    Diabetes Maternal Grandfather    Breast cancer Cousin    Colon cancer Neg Hx    Endometrial cancer Neg Hx    Pancreatic cancer Neg Hx    Prostate cancer Neg Hx    Ovarian cancer Neg Hx      Prior to Admission medications  Medication Sig Start Date End Date Taking? Authorizing Provider  Accu-Chek FastClix Lancets MISC 1 Units by Does not apply route daily. Use once a daily in morning to check blood glucose 01/16/21   Lemon Raisin, MD  atorvastatin  (LIPITOR) 40 MG tablet Take 1 tablet (40 mg total) by mouth at bedtime. 09/20/23 09/19/24  Shawn Sick, MD  bisacodyl  (DULCOLAX) 10 MG suppository Place 1 suppository (10 mg total) rectally as needed for moderate constipation. 12/22/22  Lovie Clarity, MD  Blood Glucose Monitoring Suppl (ACCU-CHEK GUIDE) w/Device KIT 1 Units by Does not apply route daily. 01/16/21   Lemon Raisin, MD  cetirizine  (ZYRTEC ) 10 MG tablet Take 1 tablet (10 mg total) by mouth daily. 08/10/23   Shawn Sick, MD  Cholecalciferol (VITAMIN D3) 10 MCG (400 UNIT) tablet Take 2 tablets (800 Units total) by mouth daily. 08/10/23   Shawn Sick, MD  dorzolamide-timolol (COSOPT) 2-0.5 % ophthalmic solution SMARTSIG:In Eye(s) 02/19/22   [provider]  Empagliflozin -metFORMIN  HCl (SYNJARDY ) 05-998 MG TABS Take 1 tablet by mouth in the morning and at bedtime. 06/13/23 06/12/24  Lovie Clarity, MD  glucose blood (ACCU-CHEK GUIDE) test strip Use as instructed  01/16/21   Lemon Raisin, MD  ibuprofen  (ADVIL ) 600 MG tablet Take 1 tablet (600 mg total) by mouth every 6 (six) hours as needed. 11/27/23   [name], [name], NP  ketorolac  (ACULAR ) 0.5 % ophthalmic solution SMARTSIG:In Eye(s) 06/30/22   [provider]  latanoprost  (XALATAN ) 0.005 % ophthalmic solution INSTILL 1 DROP IN BOTH EYES EVERY DAY IN THE EVENING 10/06/20   [name], [name], MD  losartan -hydrochlorothiazide  (HYZAAR) 100-25 MG tablet Take 1 tablet by mouth daily. 12/07/23   Shawn Sick, MD  meclizine  (ANTIVERT ) 25 MG tablet Take 1 tablet (25 mg total) by mouth 3 (three) times daily as needed for dizziness. 08/02/23   Raford Lenis, MD  omeprazole  (PRILOSEC) 40 MG capsule Take 1 capsule (40 mg total) by mouth daily. 10/07/23 10/06/24  Shawn Sick, MD  pantoprazole  (PROTONIX ) 40 MG tablet Take 1 tablet (40 mg total) by mouth daily. 08/02/23   Raford Lenis, MD  pioglitazone  (ACTOS ) 30 MG tablet Take 1 tablet (30 mg total) by mouth daily. 05/12/23 05/11/24  Lovie Clarity, MD  polyethylene glycol (MIRALAX ) 17 g packet Take 17 g by mouth 2 (two) times daily. 12/22/22   Lovie Clarity, MD  potassium chloride  SA (KLOR-CON  M) 20 MEQ tablet Take 1 tablet (20 mEq total) by mouth 2 (two) times daily. 08/02/23   Raford Lenis, MD  prednisoLONE acetate (PRED FORTE) 1 % ophthalmic suspension SMARTSIG:In Eye(s) 06/30/22   [provider]  prochlorperazine  (COMPAZINE ) 10 MG tablet Take 1 tablet (10 mg total) by mouth every 6 (six) hours as needed for nausea or vomiting. 08/02/23   Raford Lenis, MD  Semaglutide  (RYBELSUS ) 14 MG TABS Take 1 tablet (14 mg total) by mouth daily. 08/10/23 08/09/24  Shawn Sick, MD  senna-docusate (SENOKOT-S) 8.6-50 MG tablet Take 2 tablets by mouth 2 (two) times daily. 12/22/22   Lovie Clarity, MD    Physical Exam: BP (!) 151/84   Pulse (!) 101   Temp 97.8 F (36.6 C) (Oral)   Resp 18   SpO2 99%  General:  Alert, oriented, calm, in no acute distress  Eyes: EOMI, clear  conjuctivae, white sclerea Neck: supple, no masses, trachea mildline  Cardiovascular: RRR, no murmurs or rubs, no peripheral edema  Respiratory: clear to auscultation bilaterally, no wheezes, no crackles  Abdomen: soft, nontender, nondistended, normal bowel tones heard  Skin: dry, no rashes  Musculoskeletal: no joint effusions, normal range of motion  Psychiatric: appropriate affect, normal speech  Neurologic: extraocular muscles intact, clear speech, moving all extremities with intact sensorium         Labs on Admission:  Basic Metabolic Panel: Recent Labs  Lab 02/04/24 0152  NA 131*  K 2.7*  CL 92*  CO2 28  GLUCOSE 141*  BUN 7*  CREATININE 0.94  CALCIUM  9.4  Liver Function Tests: Recent Labs  Lab 02/04/24 0152  AST 31  ALT 18  ALKPHOS 92  BILITOT 1.2  PROT 7.2  ALBUMIN 4.5   No results for input(s): LIPASE, AMYLASE in the last 168 hours. No results for input(s): AMMONIA in the last 168 hours. CBC: Recent Labs  Lab 02/04/24 0152  WBC 7.7  HGB 12.6  HCT 39.2  MCV 92.0  PLT 236   Cardiac Enzymes: No results for input(s): CKTOTAL, CKMB, CKMBINDEX, TROPONINI in the last 168 hours. BNP (last 3 results) No results for input(s): BNP in the last 8760 hours.  ProBNP (last 3 results) No results for input(s): PROBNP in the last 8760 hours.  CBG: Recent Labs  Lab 02/04/24 0146  GLUCAP 134*    Radiological Exams on Admission: DG Chest Portable 1 View Result Date: 02/04/2024 EXAM: 1 VIEW(S) XRAY OF THE CHEST 02/04/2024 04:31:33 AM COMPARISON: None available. CLINICAL HISTORY: Pain, weakness, and dizziness. Elevated blood pressure. FINDINGS: LUNGS AND PLEURA: No focal pulmonary opacity. No pleural effusion. No pneumothorax. HEART AND MEDIASTINUM: No acute abnormality of the cardiac and mediastinal silhouettes. BONES AND SOFT TISSUES: Thoracic degenerative changes. IMPRESSION: 1. No acute findings. Electronically signed by: Waddell Calk MD  02/04/2024 07:10 AM EST RP Workstation: [id number]   CT Head Wo Contrast Result Date: 02/04/2024 EXAM: CT HEAD WITHOUT CONTRAST 02/04/2024 03:57:37 AM TECHNIQUE: CT of the head was performed without the administration of intravenous contrast. Automated exposure control, iterative reconstruction, and/or weight based adjustment of the mA/kV was utilized to reduce the radiation dose to as low as reasonably achievable. COMPARISON: Prior CT from 07/04/2003. CLINICAL HISTORY: Polytrauma, blunt. FINDINGS: BRAIN AND VENTRICLES: No acute hemorrhage. No evidence of acute infarct. No hydrocephalus. No extra-axial collection. No mass effect or midline shift. ORBITS: Bilateral lens replacement. SINUSES: No acute abnormality. SOFT TISSUES AND SKULL: No acute soft tissue abnormality. No skull fracture. IMPRESSION: 1. No acute intracranial abnormality. Electronically signed by: Morene Hoard MD 02/04/2024 05:35 AM EST RP Workstation: [id number]   Assessment/Plan Janice Massey is a 69 y.o. female with medical history significant for hypertension, small stable VSD, type 2 diabetes, deafness being admitted to the hospital with generalized weakness, abdominal/chest discomfort, diarrhea and hypokalemia.   Chest discomfort and weakness-I think this is actually a GI issue, given the fact that she also had associated mild nausea, with abdominal bloating/discomfort, and diarrhea thereafter.  There appears to be no PE on CT scan, no effusion, no large pericardial effusion, no other intrathoracic abnormality.  She has 2 negative troponins.  EKG which I personally reviewed is normal.  Currently she is free of chest/abdominal discomfort.  I discussed over the phone with Dr. Kennyth of cardiology, who recommends no further inpatient workup.  Weakness likely due to diarrhea causing hypokalemia -Observation admission -Monitor on telemetry -Continue oral PPI  Hypokalemia-likely the cause of her weakness/dizziness, and caused by  copious diarrhea overnight which has seemingly slowed down. -Check magnesium  -Monitor on telemetry -Continue p.o. and IV potassium supplementation -Recheck potassium level this evening  Hypertension-Hyzaar  Type 2 diabetes-well-controlled -Hold home oral antihyperglycemics -Carb modified diet -Moderate dose sliding scale  Hyperlipidemia-Lipitor  GERD-Protonix   DVT prophylaxis: Lovenox      Code Status: Full Code  Consults called: Discussed with cardiology Dr. Kennyth.  Admission status: Observation  Time spent: 49 minutes  Janice Essick CHRISTELLA Gail MD [facility] Pager [phone]  If 7PM-7AM, please contact night-coverage [email or web address] Password [facility]  02/04/2024, 7:48 AM      [1]  Allergies Allergen  Reactions   Codeine Itching   Lisinopril  Cough   Penicillins Itching    Has patient had a PCN reaction causing immediate rash, facial/tongue/throat swelling, SOB or lightheadedness with hypotension: YES Has patient had a PCN reaction causing severe rash involving mucus membranes or skin necrosis: NO Has patient had a PCN reaction that required hospitalization NO Has patient had a PCN reaction occurring within the last 10 years: NO If all of the above answers are NO, then may proceed with Cephalosporin use.   "

## 2024-02-04 NOTE — ED Notes (Signed)
 Mag drawn, sunquest down, lab called to mark as in process.

## 2024-02-04 NOTE — ED Notes (Signed)
 Patient transported to CT

## 2024-02-04 NOTE — ED Notes (Signed)
 Patient repositioned and provided drink.  Denies any other needs at this time.

## 2024-02-04 NOTE — ED Provider Notes (Signed)
 " Janice Massey Provider Note   CSN: [id number] Arrival date & time: 02/04/24  0122     Patient presents with: Hypertension and Weakness   Janice Massey is a 69 y.o. female.   The history is provided by the patient. The history is limited by a language barrier. A language interpreter was used.  Hypertension This is a chronic problem. The current episode started more than 1 week ago. The problem occurs constantly. The problem has been gradually worsening. Associated symptoms include chest pain. Pertinent negatives include no abdominal pain. Nothing aggravates the symptoms. Nothing relieves the symptoms. The treatment provided no relief.  Patient with DM and HTN presents with right sided sharp chest pain and global weakness as well as worsening elevated blood pressures at home.      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    Hypertension    Mute    Obese    URI (upper respiratory infection)    Ventricular septal defect 2004 per echo   small membranous VSD     Prior to Admission medications  Medication Sig Start Date End Date Taking? Authorizing Provider  Accu-Chek FastClix Lancets MISC 1 Units by Does not apply route daily. Use once a daily in morning to check blood glucose 01/16/21   Lemon Raisin, MD  atorvastatin  (LIPITOR) 40 MG tablet Take 1 tablet (40 mg total) by mouth at bedtime. 09/20/23 09/19/24  Shawn Sick, MD  bisacodyl  (DULCOLAX) 10 MG suppository Place 1 suppository (10 mg total) rectally as needed for moderate constipation.  12/22/22   Lovie Clarity, MD  Blood Glucose Monitoring Suppl (ACCU-CHEK GUIDE) w/Device KIT 1 Units by Does not apply route daily. 01/16/21   Lemon Raisin, MD  cetirizine  (ZYRTEC ) 10 MG tablet Take 1 tablet (10 mg total) by mouth daily. 08/10/23   Shawn Sick, MD  Cholecalciferol (VITAMIN D3) 10 MCG (400 UNIT) tablet Take 2 tablets (800 Units total) by mouth daily. 08/10/23   Shawn Sick, MD  dorzolamide-timolol (COSOPT) 2-0.5 % ophthalmic solution SMARTSIG:In Eye(s) 02/19/22   [provider]  Empagliflozin -metFORMIN  HCl (SYNJARDY ) 05-998 MG TABS Take 1 tablet by mouth in the morning and at bedtime. 06/13/23 06/12/24  Lovie Clarity, MD  glucose blood (ACCU-CHEK GUIDE) test strip Use as instructed 01/16/21   Lemon Raisin, MD  ibuprofen  (ADVIL ) 600 MG tablet Take 1 tablet (600 mg total) by mouth every 6 (six) hours as needed. 11/27/23   [name], [name], NP  ketorolac  (ACULAR ) 0.5 % ophthalmic solution SMARTSIG:In Eye(s) 06/30/22   [provider]  latanoprost  (XALATAN ) 0.005 % ophthalmic solution INSTILL 1 DROP IN BOTH EYES EVERY DAY IN THE EVENING 10/06/20   [name], [name], MD  losartan -hydrochlorothiazide  (HYZAAR) 100-25 MG tablet Take 1 tablet by mouth daily. 12/07/23   Shawn Sick, MD  meclizine  (ANTIVERT ) 25 MG tablet Take 1 tablet (25 mg total) by mouth 3 (three) times daily as needed for dizziness. 08/02/23   Raford Lenis, MD  omeprazole  (PRILOSEC) 40 MG capsule Take 1 capsule (  40 mg total) by mouth daily. 10/07/23 10/06/24  Shawn Sick, MD  pantoprazole  (PROTONIX ) 40 MG tablet Take 1 tablet (40 mg total) by mouth daily. 08/02/23   Raford Lenis, MD  pioglitazone  (ACTOS ) 30 MG tablet Take 1 tablet (30 mg total) by mouth daily. 05/12/23 05/11/24  Lovie Clarity, MD  polyethylene glycol (MIRALAX ) 17 g packet Take 17 g by mouth 2 (two) times daily. 12/22/22   Lovie Clarity, MD  potassium chloride  SA (KLOR-CON  M) 20 MEQ tablet Take 1 tablet (20 mEq total) by mouth 2 (two) times daily. 08/02/23    Raford Lenis, MD  prednisoLONE acetate (PRED FORTE) 1 % ophthalmic suspension SMARTSIG:In Eye(s) 06/30/22   [provider]  prochlorperazine  (COMPAZINE ) 10 MG tablet Take 1 tablet (10 mg total) by mouth every 6 (six) hours as needed for nausea or vomiting. 08/02/23   Raford Lenis, MD  Semaglutide  (RYBELSUS ) 14 MG TABS Take 1 tablet (14 mg total) by mouth daily. 08/10/23 08/09/24  Shawn Sick, MD  senna-docusate (SENOKOT-S) 8.6-50 MG tablet Take 2 tablets by mouth 2 (two) times daily. 12/22/22   Lovie Clarity, MD    Allergies: Codeine, Lisinopril , and Penicillins    Review of Systems  Constitutional:  Positive for fatigue. Negative for fever.  Cardiovascular:  Positive for chest pain.  Gastrointestinal:  Negative for abdominal pain and nausea.  Neurological:  Positive for light-headedness. Negative for facial asymmetry, weakness and numbness.  All other systems reviewed and are negative.   Updated Vital Signs BP (!) 151/84   Pulse (!) 101   Temp 97.8 F (36.6 C) (Oral)   Resp 18   SpO2 99%   Physical Exam Vitals and nursing note reviewed.  Constitutional:      General: She is not in acute distress.    Appearance: Normal appearance. She is well-developed. She is not diaphoretic.  HENT:     Head: Normocephalic and atraumatic.     Nose: Nose normal.  Eyes:     Pupils: Pupils are equal, round, and reactive to light.  Cardiovascular:     Rate and Rhythm: Regular rhythm. Tachycardia present.     Pulses: Normal pulses.     Heart sounds: Normal heart sounds.  Pulmonary:     Effort: Pulmonary effort is normal. No respiratory distress.     Breath sounds: Normal breath sounds.  Abdominal:     General: Bowel sounds are normal. There is no distension.     Palpations: Abdomen is soft.     Tenderness: There is no abdominal tenderness. There is no guarding or rebound.  Musculoskeletal:        General: Normal range of motion.     Cervical back: Normal range of motion and neck  supple.  Skin:    General: Skin is warm and dry.     Capillary Refill: Capillary refill takes less than 2 seconds.     Findings: No erythema or rash.  Neurological:     General: No focal deficit present.     Mental Status: She is alert and oriented to person, place, and time.     Deep Tendon Reflexes: Reflexes normal.  Psychiatric:        Mood and Affect: Mood normal.     (all labs ordered are listed, but only abnormal results are displayed) Results for orders placed or performed during the hospital encounter of 02/04/24  CBG monitoring, ED   Collection Time: 02/04/24  1:46 AM  Result Value Ref Range   Glucose-Capillary 134 (  H) 70 - 99 mg/dL  Comprehensive metabolic panel   Collection Time: 02/04/24  1:52 AM  Result Value Ref Range   Sodium 131 (L) 135 - 145 mmol/L   Potassium 2.7 (LL) 3.5 - 5.1 mmol/L   Chloride 92 (L) 98 - 111 mmol/L   CO2 28 22 - 32 mmol/L   Glucose, Bld 141 (H) 70 - 99 mg/dL   BUN 7 (L) 8 - 23 mg/dL   Creatinine, Ser 9.05 0.44 - 1.00 mg/dL   Calcium  9.4 8.9 - 10.3 mg/dL   Total Protein 7.2 6.5 - 8.1 g/dL   Albumin 4.5 3.5 - 5.0 g/dL   AST 31 15 - 41 U/L   ALT 18 0 - 44 U/L   Alkaline Phosphatase 92 38 - 126 U/L   Total Bilirubin 1.2 0.0 - 1.2 mg/dL   GFR, Estimated >39 >39 mL/min   Anion gap 11 5 - 15  CBC   Collection Time: 02/04/24  1:52 AM  Result Value Ref Range   WBC 7.7 4.0 - 10.5 K/uL   RBC 4.26 3.87 - 5.11 MIL/uL   Hemoglobin 12.6 12.0 - 15.0 g/dL   HCT 60.7 63.9 - 53.9 %   MCV 92.0 80.0 - 100.0 fL   MCH 29.6 26.0 - 34.0 pg   MCHC 32.1 30.0 - 36.0 g/dL   RDW 86.7 88.4 - 84.4 %   Platelets 236 150 - 400 K/uL   nRBC 0.0 0.0 - 0.2 %  Urinalysis, Routine w reflex microscopic -Urine, Clean Catch   Collection Time: 02/04/24  3:28 AM  Result Value Ref Range   Color, Urine COLORLESS (A) YELLOW   APPearance CLEAR CLEAR   Specific Gravity, Urine 1.000 (L) 1.005 - 1.030   pH 6.0 5.0 - 8.0   Glucose, UA >=500 (A) NEGATIVE mg/dL   Hgb urine  dipstick NEGATIVE NEGATIVE   Bilirubin Urine NEGATIVE NEGATIVE   Ketones, ur NEGATIVE NEGATIVE mg/dL   Protein, ur NEGATIVE NEGATIVE mg/dL   Nitrite NEGATIVE NEGATIVE   Leukocytes,Ua NEGATIVE NEGATIVE   RBC / HPF 0-5 0 - 5 RBC/hpf   WBC, UA 0-5 0 - 5 WBC/hpf   Bacteria, UA NONE SEEN NONE SEEN   Squamous Epithelial / HPF 0-5 0 - 5 /HPF  Troponin T, High Sensitivity   Collection Time: 02/04/24  4:45 AM  Result Value Ref Range   Troponin T High Sensitivity 19 0 - 19 ng/L  Troponin T, High Sensitivity   Collection Time: 02/04/24  6:47 AM  Result Value Ref Range   Troponin T High Sensitivity 19 0 - 19 ng/L   DG Chest Portable 1 View Result Date: 02/04/2024 EXAM: 1 VIEW(S) XRAY OF THE CHEST 02/04/2024 04:31:33 AM COMPARISON: None available. CLINICAL HISTORY: Pain, weakness, and dizziness. Elevated blood pressure. FINDINGS: LUNGS AND PLEURA: No focal pulmonary opacity. No pleural effusion. No pneumothorax. HEART AND MEDIASTINUM: No acute abnormality of the cardiac and mediastinal silhouettes. BONES AND SOFT TISSUES: Thoracic degenerative changes. IMPRESSION: 1. No acute findings. Electronically signed by: Waddell Calk MD 02/04/2024 07:10 AM EST RP Workstation: [id number]   CT Head Wo Contrast Result Date: 02/04/2024 EXAM: CT HEAD WITHOUT CONTRAST 02/04/2024 03:57:37 AM TECHNIQUE: CT of the head was performed without the administration of intravenous contrast. Automated exposure control, iterative reconstruction, and/or weight based adjustment of the mA/kV was utilized to reduce the radiation dose to as low as reasonably achievable. COMPARISON: Prior CT from 07/04/2003. CLINICAL HISTORY: Polytrauma, blunt. FINDINGS: BRAIN AND VENTRICLES: No acute hemorrhage.  No evidence of acute infarct. No hydrocephalus. No extra-axial collection. No mass effect or midline shift. ORBITS: Bilateral lens replacement. SINUSES: No acute abnormality. SOFT TISSUES AND SKULL: No acute soft tissue abnormality. No skull  fracture. IMPRESSION: 1. No acute intracranial abnormality. Electronically signed by: Morene Hoard MD 02/04/2024 05:35 AM EST RP Workstation: [id number]     EKG: EKG Interpretation Date/Time:  Saturday February 04 2024 01:51:00 EST Ventricular Rate:  105 PR Interval:  178 QRS Duration:  111 QT Interval:  331 QTC Calculation: 438 R Axis:   85  Text Interpretation: Sinus tachycardia I Confirmed by Nettie, Tarissa Kerin ([id number]) on 02/04/2024 3:40:45 AM  Radiology: [facility] Chest Portable 1 View Result Date: 02/04/2024 EXAM: 1 VIEW(S) XRAY OF THE CHEST 02/04/2024 04:31:33 AM COMPARISON: None available. CLINICAL HISTORY: Pain, weakness, and dizziness. Elevated blood pressure. FINDINGS: LUNGS AND PLEURA: No focal pulmonary opacity. No pleural effusion. No pneumothorax. HEART AND MEDIASTINUM: No acute abnormality of the cardiac and mediastinal silhouettes. BONES AND SOFT TISSUES: Thoracic degenerative changes. IMPRESSION: 1. No acute findings. Electronically signed by: Waddell Calk MD 02/04/2024 07:10 AM EST RP Workstation: [id number]   CT Head Wo Contrast Result Date: 02/04/2024 EXAM: CT HEAD WITHOUT CONTRAST 02/04/2024 03:57:37 AM TECHNIQUE: CT of the head was performed without the administration of intravenous contrast. Automated exposure control, iterative reconstruction, and/or weight based adjustment of the mA/kV was utilized to reduce the radiation dose to as low as reasonably achievable. COMPARISON: Prior CT from 07/04/2003. CLINICAL HISTORY: Polytrauma, blunt. FINDINGS: BRAIN AND VENTRICLES: No acute hemorrhage. No evidence of acute infarct. No hydrocephalus. No extra-axial collection. No mass effect or midline shift. ORBITS: Bilateral lens replacement. SINUSES: No acute abnormality. SOFT TISSUES AND SKULL: No acute soft tissue abnormality. No skull fracture. IMPRESSION: 1. No acute intracranial abnormality. Electronically signed by: Morene Hoard MD 02/04/2024 05:35 AM EST RP Workstation:  [id number]     Procedures   Medications Ordered in the ED  potassium chloride  10 mEq in 100 mL IVPB (10 mEq Intravenous New Bag/Given 02/04/24 0622)  atorvastatin  (LIPITOR) tablet 40 mg (has no administration in time range)  loratadine  (CLARITIN ) tablet 10 mg (has no administration in time range)  losartan -hydrochlorothiazide  (HYZAAR) 100-25 MG per tablet 1 tablet (has no administration in time range)  pantoprazole  (PROTONIX ) EC tablet 40 mg (has no administration in time range)  enoxaparin  (LOVENOX ) injection 40 mg (has no administration in time range)  acetaminophen  (TYLENOL ) tablet 650 mg (has no administration in time range)    Or  acetaminophen  (TYLENOL ) suppository 650 mg (has no administration in time range)  traZODone  (DESYREL ) tablet 25 mg (has no administration in time range)  ondansetron  (ZOFRAN ) tablet 4 mg (has no administration in time range)    Or  ondansetron  (ZOFRAN ) injection 4 mg (has no administration in time range)  albuterol  (PROVENTIL ) (2.5 MG/3ML) 0.083% nebulizer solution 2.5 mg (has no administration in time range)  insulin  aspart (novoLOG ) injection 0-15 Units (has no administration in time range)  insulin  aspart (novoLOG ) injection 0-5 Units (has no administration in time range)  magnesium  sulfate IVPB 2 g 50 mL (0 g Intravenous Stopped 02/04/24 0457)  potassium chloride  SA (KLOR-CON  M) CR tablet 40 mEq (40 mEq Oral Given 02/04/24 0434)  sodium chloride  0.9 % bolus 500 mL (0 mLs Intravenous Stopped 02/04/24 0546)  iohexol  (OMNIPAQUE ) 350 MG/ML injection 75 mL (75 mLs Intravenous Contrast Given 02/04/24 0538)  Medical Decision Making Patient with HTN and fatigue and right sided CP   Amount and/or Complexity of Data Reviewed External Data Reviewed: notes.    Details: Previous notes reviewed  Labs: ordered.    Details: Troponin 19, top normal. Urine is negative for uti sodium slight low 131, low potassium 2.7, normal  creatinine normal white count 7.7 normal hemoglobin 12.6, normal platelets  Radiology: ordered and independent interpretation performed.    Details: No PNA on CTA by me  ECG/medicine tests: ordered and independent interpretation performed. Decision-making details documented in ED Course.  Risk Prescription drug management. Decision regarding hospitalization. Risk Details: Patient is orthostatic.  Fatigue may be secondary to low potassium     Final diagnoses:  Hypokalemia  Primary hypertension  Chest pain, unspecified type   The patient appears reasonably stabilized for admission considering the current resources, flow, and capabilities available in the ED at this time, and I doubt any other [facility] requiring further screening and/or treatment in the ED prior to admission.  ED Discharge Orders     None          Aurorah Schlachter, MD 02/04/24 0745  "

## 2024-02-04 NOTE — Plan of Care (Signed)

## 2024-02-04 NOTE — ED Triage Notes (Signed)
 Pt states that she has been feeling weak and dizzy that started tonight after dinner. Pt checked her BP and it was high.

## 2024-02-05 LAB — BASIC METABOLIC PANEL WITH GFR
Anion gap: 9 (ref 5–15)
BUN: 6 mg/dL — ABNORMAL LOW (ref 8–23)
CO2: 28 mmol/L (ref 22–32)
Calcium: 9.6 mg/dL (ref 8.9–10.3)
Chloride: 102 mmol/L (ref 98–111)
Creatinine, Ser: 0.77 mg/dL (ref 0.44–1.00)
GFR, Estimated: >60 mL/min
Glucose, Bld: 114 mg/dL — ABNORMAL HIGH (ref 70–99)
Potassium: 3.8 mmol/L (ref 3.5–5.1)
Sodium: 139 mmol/L (ref 135–145)

## 2024-02-05 LAB — CBC
HCT: 37.5 % (ref 36.0–46.0)
Hemoglobin: 12 g/dL (ref 12.0–15.0)
MCH: 29.9 pg (ref 26.0–34.0)
MCHC: 32 g/dL (ref 30.0–36.0)
MCV: 93.5 fL (ref 80.0–100.0)
Platelets: 225 10*3/uL (ref 150–400)
RBC: 4.01 MIL/uL (ref 3.87–5.11)
RDW: 13.6 % (ref 11.5–15.5)
WBC: 6.7 10*3/uL (ref 4.0–10.5)
nRBC: 0 % (ref 0.0–0.2)

## 2024-02-05 LAB — GLUCOSE, CAPILLARY
Glucose-Capillary: 108 mg/dL — ABNORMAL HIGH (ref 70–99)
Glucose-Capillary: 125 mg/dL — ABNORMAL HIGH (ref 70–99)
Glucose-Capillary: 156 mg/dL — ABNORMAL HIGH (ref 70–99)
Glucose-Capillary: 169 mg/dL — ABNORMAL HIGH (ref 70–99)

## 2024-02-05 MED ORDER — ORAL CARE MOUTH RINSE: 15.0000 mL | OROMUCOSAL | Status: DC | PRN

## 2024-02-05 MED ORDER — POLYETHYLENE GLYCOL 3350 17 G PO PACK
17.0000 g | PACK | Freq: Two times a day (BID) | ORAL | Status: DC
  Administered 2024-02-05 – 2024-02-06 (×3): 17 g via ORAL
  Filled 2024-02-05 (×3): qty 1

## 2024-02-05 MED ORDER — SENNA 8.6 MG PO TABS
1.0000 | ORAL_TABLET | Freq: Every day | ORAL | Status: DC
  Administered 2024-02-05: 8.6 mg via ORAL
  Filled 2024-02-05: qty 1

## 2024-02-05 NOTE — Plan of Care (Signed)
 Pt ambulated to restroom three times over the last 12 hours with successful urination and is able to turn and move self up in bed.  Bedtime glucose needed no coverage, and she is maintaining O2 sat average of 97 on room air.   Problem: Fluid Volume: Goal: Ability to maintain a balanced intake and output will improve Outcome: Progressing   Problem: Metabolic: Goal: Ability to maintain appropriate glucose levels will improve Outcome: Progressing   Problem: Skin Integrity: Goal: Risk for impaired skin integrity will decrease Outcome: Progressing   Problem: Tissue Perfusion: Goal: Adequacy of tissue perfusion will improve Outcome: Progressing   Problem: Education: Goal: Knowledge of General Education information will improve Description: Including pain rating scale, medication(s)/side effects and non-pharmacologic comfort measures Outcome: Progressing   Problem: Clinical Measurements: Goal: Respiratory complications will improve Outcome: Progressing Goal: Cardiovascular complication will be avoided Outcome: Progressing   Problem: Activity: Goal: Risk for activity intolerance will decrease Outcome: Progressing   Problem: Elimination: Goal: Will not experience complications related to urinary retention Outcome: Progressing   Problem: Pain Managment: Goal: General experience of comfort will improve and/or be controlled Outcome: Progressing   Problem: Safety: Goal: Ability to remain free from injury will improve Outcome: Progressing   Problem: Skin Integrity: Goal: Risk for impaired skin integrity will decrease Outcome: Progressing

## 2024-02-05 NOTE — Progress Notes (Signed)
" °  Progress Note   Patient: Janice Massey FMW:[id number] DOB: [birth year] DOA: 02/04/2024     0 DOS: the patient was seen and examined on 02/05/2024   Brief hospital course: 69 year old woman PMH deaf, mute, small stable VSD, diabetes, presented with generalized weakness, abdominal and chest discomfort, diarrhea and hypokalemia.  Admitted for further evaluation of the same.  Consultants None  Procedures/Events    Assessment and Plan: Chest discomfort--resolved Generalized weakness Thought to be gastroenterology issue given nausea, abdominal bloating and discomfort and diarrhea. Chest x-ray no acute disease.  CTA chest no PE or acute complicating features.  Troponins negative.  EKG nonacute.  Symptoms apparently resolved in the emergency department.  Admitting physician discussed with cardiology, no further workup recommended.  Hypokalemia--resolved Dizziness--resolved Generalized weakness Thought to be secondary to diarrhea Repleted  Constipation Bowel regimen  Essential hypertension Diabetes mellitus type 2 GERD  Deaf  Proving.  Likely home tomorrow.   Subjective:  Feels better today.  No dizziness.  No chest pain.  She does have some lower abdominal pain which she feels from constipation.  No diarrhea.  Tolerating diet.  No nausea or vomiting.  Of note entire interview, exam and discussion of assessment and plan with patient via iPad sign language interpreter.  Physical Exam: Vitals:   02/04/24 1920 02/04/24 2150 02/05/24 0414 02/05/24 0858  BP: 135/61 (!) 143/69 123/66   Pulse: 95 89 80   Resp: 16 16 17    Temp: 98.8 F (37.1 C) 97.6 F (36.4 C) 98.8 F (37.1 C)   TempSrc: Oral Oral Oral   SpO2: 96% 96% 99%   Weight:    73 kg  Height:    5' 7 (1.702 m)   Physical Exam Vitals reviewed.  Constitutional:      General: She is not in acute distress.    Appearance: She is not ill-appearing or toxic-appearing.  Cardiovascular:     Rate and Rhythm: Normal rate  and regular rhythm.     Heart sounds: No murmur heard. Pulmonary:     Effort: Pulmonary effort is normal. No respiratory distress.     Breath sounds: No wheezing, rhonchi or rales.  Abdominal:     General: There is no distension.     Palpations: Abdomen is soft.  Neurological:     Mental Status: She is alert.  Psychiatric:        Mood and Affect: Mood normal.        Behavior: Behavior normal.     Data Reviewed: Basic metabolic panel unremarkable Potassium 3.8 Magnesium  was 2.1 Troponins were negative CBC unremarkable  Family Communication: Daughter by Debby in the room with ASL interpreter  Disposition: Status is: Observation      Time spent: 35 minutes  Author: Toribio Door, MD 02/05/2024 2:13 PM  For on call review [email or web address].    "

## 2024-02-05 NOTE — Hospital Course (Signed)
 69 year old woman PMH deaf, mute, small stable VSD, diabetes, presented with generalized weakness, abdominal and chest discomfort, diarrhea and hypokalemia.  Admitted for further evaluation of the same.  Consultants None  Procedures/Events

## 2024-02-05 NOTE — Plan of Care (Signed)

## 2024-02-06 LAB — GLUCOSE, CAPILLARY: Glucose-Capillary: 126 mg/dL — ABNORMAL HIGH (ref 70–99)

## 2024-02-06 LAB — MISC LABCORP TEST (SEND OUT)
LabCorp test name: 83935
Labcorp test code: 83935

## 2024-02-06 NOTE — Discharge Summary (Signed)
 " Physician Discharge Summary   Patient: Janice Massey MRN: [id number] DOB: February 25, 1955  Admit date:     02/04/2024  Discharge date: 02/06/24  Discharge Physician: Toribio Door   PCP: Shawn Sick, MD   Recommendations at discharge:  Routine care  Patient seen, interviewed, examined and care discussed with iPad interpreter.  Also discussed with daughter on FaceTime in room.  Discharge Diagnoses: Active Problems:   Chest pain Generalized weakness Hypokalemia--resolved Dizziness--resolved Constipation Essential hypertension Diabetes mellitus type 2 GERD Deaf  Hospital Course: 69 year old woman PMH deaf, mute, small stable VSD, diabetes, presented with generalized weakness, abdominal and chest discomfort, diarrhea and hypokalemia.  Admitted for further evaluation of the same.  Potassium was repleted.  Diarrhea resolved.  Chest discomfort resolved.  Hospitalization uncomplicated.  Consultants None  Procedures/Events   Chest discomfort--resolved Generalized weakness Thought to be gastroenterology issue given nausea, abdominal bloating and discomfort and diarrhea. Chest x-ray no acute disease.  CTA chest no PE or acute complicating features.  Troponins negative.  EKG nonacute.  Symptoms apparently resolved in the emergency department.  Admitting physician discussed with cardiology, no further workup recommended.   Hypokalemia--resolved Dizziness--resolved Generalized weakness Thought to be secondary to diarrhea Repleted   Constipation Bowel regimen   Essential hypertension Diabetes mellitus type 2 GERD   Deaf  Disposition: Home Diet recommendation:  Regular diet DISCHARGE MEDICATION: Allergies as of 02/06/2024       Reactions   Codeine Itching   Lisinopril  Cough   Penicillins Itching   Has patient had a PCN reaction causing immediate rash, facial/tongue/throat swelling, SOB or lightheadedness with hypotension: YES Has patient had a PCN reaction causing severe  rash involving mucus membranes or skin necrosis: NO Has patient had a PCN reaction that required hospitalization NO Has patient had a PCN reaction occurring within the last 10 years: NO If all of the above answers are NO, then may proceed with Cephalosporin use.        Medication List     STOP taking these medications    dorzolamide-timolol 2-0.5 % ophthalmic solution Commonly known as: COSOPT   ibuprofen  600 MG tablet Commonly known as: ADVIL    ketorolac  0.5 % ophthalmic solution Commonly known as: ACULAR    latanoprost  0.005 % ophthalmic solution Commonly known as: XALATAN    meclizine  25 MG tablet Commonly known as: ANTIVERT    pantoprazole  40 MG tablet Commonly known as: PROTONIX    prednisoLONE acetate 1 % ophthalmic suspension Commonly known as: PRED FORTE       TAKE these medications    Accu-Chek FastClix Lancets Misc 1 Units by Does not apply route daily. Use once a daily in morning to check blood glucose   Accu-Chek Guide test strip Generic drug: glucose blood Use as instructed   Accu-Chek Guide w/Device Kit 1 Units by Does not apply route daily.   atorvastatin  40 MG tablet Commonly known as: LIPITOR Take 1 tablet (40 mg total) by mouth at bedtime.   bisacodyl  10 MG suppository Commonly known as: DULCOLAX Place 1 suppository (10 mg total) rectally as needed for moderate constipation.   cetirizine  10 MG tablet Commonly known as: ZYRTEC  Take 1 tablet (10 mg total) by mouth daily.   losartan -hydrochlorothiazide  100-25 MG tablet Commonly known as: HYZAAR Take 1 tablet by mouth daily.   omeprazole  40 MG capsule Commonly known as: PRILOSEC Take 1 capsule (40 mg total) by mouth daily.   pioglitazone  15 MG tablet Commonly known as: ACTOS  Take 15 mg by mouth daily. What changed: Another  medication with the same name was removed. Continue taking this medication, and follow the directions you see here.   polyethylene glycol 17 g packet Commonly  known as: MiraLax  Take 17 g by mouth 2 (two) times daily.   potassium chloride  SA 20 MEQ tablet Commonly known as: KLOR-CON  M Take 1 tablet (20 mEq total) by mouth 2 (two) times daily. What changed: when to take this   prochlorperazine  10 MG tablet Commonly known as: COMPAZINE  Take 1 tablet (10 mg total) by mouth every 6 (six) hours as needed for nausea or vomiting.   Rybelsus  14 MG Tabs Generic drug: Semaglutide  Take 1 tablet (14 mg total) by mouth daily.   senna-docusate 8.6-50 MG tablet Commonly known as: Senokot-S Take 2 tablets by mouth 2 (two) times daily.   Synjardy  05-998 MG Tabs Generic drug: Empagliflozin -metFORMIN  HCl Take 1 tablet by mouth in the morning and at bedtime.   Vitamin D3 10 MCG (400 UNIT) tablet Take 2 tablets (800 Units total) by mouth daily.        Follow-up Information     Shawn Sick, MD .   Specialty: Internal Medicine Contact information: [address] [place] [address] [phone]         Shawn Sick, MD Follow up.   Specialty: Internal Medicine Why: As needed Contact information: [address] [place] [address] [phone]               No patient reported being off eyedrops. No medications were discontinued  Discharge Exam: Filed Weights   02/05/24 0858  Weight: 73 kg   Physical Exam Vitals reviewed.  Constitutional:      General: She is not in acute distress.    Appearance: She is not ill-appearing or toxic-appearing.  Cardiovascular:     Rate and Rhythm: Normal rate and regular rhythm.     Heart sounds: No murmur heard. Pulmonary:     Effort: Pulmonary effort is normal. No respiratory distress.     Breath sounds: No wheezing, rhonchi or rales.  Neurological:     Mental Status: She is alert.  Psychiatric:        Mood and Affect: Mood normal.        Behavior: Behavior normal.      Condition at discharge: good  The results of significant diagnostics from this hospitalization  (including imaging, microbiology, ancillary and laboratory) are listed below for reference.   Imaging Studies: CT Angio Chest PE W and/or Wo Contrast Result Date: 02/04/2024 EXAM: CTA CHEST 02/04/2024 06:11:52 AM TECHNIQUE: CTA of the chest was performed without and with the administration of 75 mL of intravenous contrast (iohexol  (OMNIPAQUE ) 350 MG/ML injection). Multiplanar reformatted images are provided for review. MIP images are provided for review. Automated exposure control, iterative reconstruction, and/or weight based adjustment of the mA/kV was utilized to reduce the radiation dose to as low as reasonably achievable. COMPARISON: 03/14/2019 CLINICAL HISTORY: Patient reports weakness and dizziness that began after dinner. Elevated blood pressure. FINDINGS: PULMONARY ARTERIES: Pulmonary arteries are adequately opacified for evaluation. No acute pulmonary embolus. Main pulmonary artery is normal in caliber. MEDIASTINUM: The heart and pericardium demonstrate no acute abnormality. There is no acute abnormality of the thoracic aorta. LYMPH NODES: No mediastinal, hilar or axillary lymphadenopathy. LUNGS AND PLEURA: Mild dependent changes within the posterior lower lung zones. No focal consolidation or pulmonary edema. No evidence of pleural effusion or pneumothorax. UPPER ABDOMEN: No acute findings within the imaged portions of the upper abdomen. Status  post cholecystectomy. SOFT TISSUES AND BONES: Thoracic spondylosis. No acute soft tissue abnormality. IMPRESSION: 1. No signs of acute pulmonary embolus or other acute cardiopulmonary abnormality. Electronically signed by: Waddell Calk MD 02/04/2024 07:49 AM EST RP Workstation: [id number]   DG Chest Portable 1 View Result Date: 02/04/2024 EXAM: 1 VIEW(S) XRAY OF THE CHEST 02/04/2024 04:31:33 AM COMPARISON: None available. CLINICAL HISTORY: Pain, weakness, and dizziness. Elevated blood pressure. FINDINGS: LUNGS AND PLEURA: No focal pulmonary opacity. No  pleural effusion. No pneumothorax. HEART AND MEDIASTINUM: No acute abnormality of the cardiac and mediastinal silhouettes. BONES AND SOFT TISSUES: Thoracic degenerative changes. IMPRESSION: 1. No acute findings. Electronically signed by: Waddell Calk MD 02/04/2024 07:10 AM EST RP Workstation: [id number]   CT Head Wo Contrast Result Date: 02/04/2024 EXAM: CT HEAD WITHOUT CONTRAST 02/04/2024 03:57:37 AM TECHNIQUE: CT of the head was performed without the administration of intravenous contrast. Automated exposure control, iterative reconstruction, and/or weight based adjustment of the mA/kV was utilized to reduce the radiation dose to as low as reasonably achievable. COMPARISON: Prior CT from 07/04/2003. CLINICAL HISTORY: Polytrauma, blunt. FINDINGS: BRAIN AND VENTRICLES: No acute hemorrhage. No evidence of acute infarct. No hydrocephalus. No extra-axial collection. No mass effect or midline shift. ORBITS: Bilateral lens replacement. SINUSES: No acute abnormality. SOFT TISSUES AND SKULL: No acute soft tissue abnormality. No skull fracture. IMPRESSION: 1. No acute intracranial abnormality. Electronically signed by: Morene Hoard MD 02/04/2024 05:35 AM EST RP Workstation: [id number]    Microbiology: Results for orders placed or performed during the hospital encounter of 05/01/21  Urine Culture     Status: None   Collection Time: 05/01/21 12:32 PM   Specimen: Urine, Clean Catch  Result Value Ref Range Status   Specimen Description URINE, CLEAN CATCH  Final   Special Requests NONE  Final   Culture   Final    NO GROWTH Performed at [facility], [address] [place], [place] [address]    Report Status 05/02/2021 FINAL  Final    Labs: CBC: Recent Labs  Lab 02/04/24 0152 02/05/24 0442  WBC 7.7 6.7  HGB 12.6 12.0  HCT 39.2 37.5  MCV 92.0 93.5  PLT 236 225   Basic Metabolic Panel: Recent Labs  Lab 02/04/24 0152 02/04/24 0812 02/04/24 1843 02/05/24 0442  NA 131*  --  141  139  K 2.7*  --  3.8 3.8  CL 92*  --  105 102  CO2 28  --  28 28  GLUCOSE 141*  --  179* 114*  BUN 7*  --  6* 6*  CREATININE 0.94  --  0.82 0.77  CALCIUM  9.4  --  9.2 9.6  MG  --  2.1  --   --    Liver Function Tests: Recent Labs  Lab 02/04/24 0152  AST 31  ALT 18  ALKPHOS 92  BILITOT 1.2  PROT 7.2  ALBUMIN 4.5   CBG: Recent Labs  Lab 02/05/24 0817 02/05/24 1220 02/05/24 1559 02/05/24 2107 02/06/24 0733  GLUCAP 108* 125* 156* 169* 126*    Discharge time spent: greater than 30 minutes.  Signed: Toribio Door, MD [facility] 02/06/2024 "

## 2024-02-06 NOTE — Plan of Care (Signed)

## 2024-02-07 ENCOUNTER — Encounter: Admitting: Internal Medicine

## 2024-02-07 ENCOUNTER — Ambulatory Visit: Admitting: Physician Assistant

## 2024-02-07 ENCOUNTER — Other Ambulatory Visit: Payer: Self-pay | Admitting: *Deleted

## 2024-02-07 ENCOUNTER — Telehealth: Payer: Self-pay

## 2024-02-07 NOTE — Progress Notes (Unsigned)
" °  OFFICE NOTE:    Date:  02/07/2024  ID:  Janice Massey, DOB 31-Oct-1955, MRN [id number] PCP: Shawn Sick, MD  [facility] Providers Cardiologist:  Maude Emmer, MD { Click to update primary MD,subspecialty MD or APP then REFRESH:1}       Ventricular Septal Defect CT 03/2019: normal aorta; no CAC TTE 03/30/23: small membranous VSD w L-R shunting, EF 55-60, mild LVH, Gr 1 DD, NL RVSF, trivial MR Chest pain  MPI 12/09/14: no ischemia or infarction, EF 62; low risk Hypertension  Hyperlipidemia  Diabetes mellitus  Deaf        Discussed the use of AI scribe software for clinical note transcription with the patient, who gave verbal consent to proceed. History of Present Illness Janice Massey is a 68 y.o. female for post hospital follow up.   Last seen in clinic in 02/2023 by Lum Louis, NP.   She was admitted 1/31-2/2 with weakness, chest pain, abd pain and diarrhea. K+ was low at 2.7. Her Troponins were negative x 2. CT was neg for PE. Potassium was replaced. Symptoms resolved. Presenting symptoms were felt to be due to GI illness. ***    ROS-See HPI***    Studies Reviewed:       Chest CTA 02/04/24: no PE Results  Risk Assessment/Calculations: {Does this patient have ATRIAL FIBRILLATION?:[id number]}        Physical Exam:  VS:  There were no vitals taken for this visit.       Wt Readings from Last 3 Encounters:  02/05/24 160 lb 15 oz (73 kg)  08/10/23 158 lb 3.2 oz (71.8 kg)  05/11/23 158 lb 6.4 oz (71.8 kg)    Physical Exam***     Assessment and Plan:    Assessment & Plan VSD (ventricular septal defect), perimembranous Small VSD w L-R shunting on TTE in 02/2023. CT in 2021 w normal sized aorta. *** Essential hypertension  Mixed hyperlipidemia  Precordial pain  Assessment and Plan Assessment & Plan    {      :1}    {Are you ordering a CV Procedure (e.g. stress test, cath, DCCV, TEE, etc)?   Press F2        :[id number]}  Dispo:  No follow-ups on  file.  Signed, Glendia Ferrier, PA-C   "

## 2024-02-07 NOTE — Transitions of Care (Post Inpatient/ED Visit) (Unsigned)
" ° °  02/07/2024  Name: Janice Massey MRN: [id number] DOB: [birth year]  Today's TOC FU Call Status: Today's TOC FU Call Status:: Unsuccessful Call (1st Attempt) Unsuccessful Call (1st Attempt) Date: 02/07/24  Attempted to reach the patient regarding the most recent Inpatient/ED visit.  Follow Up Plan: Additional outreach attempts will be made to reach the patient to complete the Transitions of Care (Post Inpatient/ED visit) call.   Signature Julian Lemmings, LPN [facility] Nurse Health Advisor Direct Dial [phone]  "

## 2024-02-08 ENCOUNTER — Ambulatory Visit: Admitting: Internal Medicine

## 2024-02-08 ENCOUNTER — Encounter: Payer: Self-pay | Admitting: Cardiology

## 2024-02-08 ENCOUNTER — Ambulatory Visit: Admitting: Cardiology

## 2024-02-08 VITALS — BP 134/76 | HR 89 | Wt 164.0 lb

## 2024-02-08 DIAGNOSIS — Z79899 Other long term (current) drug therapy: Secondary | ICD-10-CM | POA: Diagnosis not present

## 2024-02-08 DIAGNOSIS — R072 Precordial pain: Secondary | ICD-10-CM

## 2024-02-08 DIAGNOSIS — I10 Essential (primary) hypertension: Secondary | ICD-10-CM | POA: Diagnosis not present

## 2024-02-08 DIAGNOSIS — Q210 Ventricular septal defect: Secondary | ICD-10-CM | POA: Diagnosis not present

## 2024-02-08 DIAGNOSIS — R079 Chest pain, unspecified: Secondary | ICD-10-CM

## 2024-02-08 DIAGNOSIS — E782 Mixed hyperlipidemia: Secondary | ICD-10-CM

## 2024-02-08 MED ORDER — METOPROLOL TARTRATE 100 MG PO TABS: ORAL_TABLET | ORAL | 0 refills | Status: DC

## 2024-02-08 MED ORDER — POTASSIUM CHLORIDE CRYS ER 20 MEQ PO TBCR: 20.0000 meq | EXTENDED_RELEASE_TABLET | Freq: Two times a day (BID) | ORAL | 2 refills | Status: AC

## 2024-02-08 NOTE — Transitions of Care (Post Inpatient/ED Visit) (Unsigned)
" ° °  02/08/2024  Name: Janice Massey MRN: [id number] DOB: 1955-09-30  Today's TOC FU Call Status: Today's TOC FU Call Status:: Unsuccessful Call (2nd Attempt) Unsuccessful Call (1st Attempt) Date: 02/07/24 Unsuccessful Call (2nd Attempt) Date: 02/08/24  Attempted to reach the patient regarding the most recent Inpatient/ED visit.  Follow Up Plan: Additional outreach attempts will be made to reach the patient to complete the Transitions of Care (Post Inpatient/ED visit) call.   Signature Julian Lemmings, LPN [facility] Nurse Health Advisor Direct Dial [phone]  "

## 2024-02-08 NOTE — Patient Instructions (Signed)
 Medication Instructions:  Metoprolol  100 mg  Take 1 tablet by mouth 2 hours before test.  *If you need a refill on your cardiac medications before your next appointment, please call your pharmacy*  Lab Work: Today: BMET If you have labs (blood work) drawn today and your tests are completely normal, you will receive your results only by: MyChart Message (if you have MyChart) OR A paper copy in the mail If you have any lab test that is abnormal or we need to change your treatment, we will call you to review the results.  Testing/Procedures: Echocardiogram Your physician has requested that you have an echocardiogram. Echocardiography is a painless test that uses sound waves to create images of your heart. It provides your doctor with information about the size and shape of your heart and how well your hearts chambers and valves are working. This procedure takes approximately one hour. There are no restrictions for this procedure. Please do NOT wear cologne, perfume, aftershave, or lotions (deodorant is allowed). Please arrive 15 minutes prior to your appointment time.  Please note: We ask at that you not bring children with you during ultrasound (echo/ vascular) testing. Due to room size and safety concerns, children are not allowed in the ultrasound rooms during exams. Our front office staff cannot provide observation of children in our lobby area while testing is being conducted. An adult accompanying a patient to their appointment will only be allowed in the ultrasound room at the discretion of the ultrasound technician under special circumstances. We apologize for any inconvenience.   Coronary CTA   Your cardiac CT will be scheduled at one of the below locations:   [facility] [address] [place], [place] [address] [phone]  OR  [facility] [address] [place], [place] [address] [phone]  OR   [facility] [address] [place], [place] [address] [phone]  If scheduled at [facility], please arrive at the [facility] and Children's Entrance (Entrance C2) of [facility] 30 minutes prior to test start time. You can use the FREE valet parking offered at entrance C (encouraged to control the heart rate for the test)  Proceed to the [facility] Radiology Department (first floor) to check-in and test prep.  All radiology patients and guests should use entrance C2 at [facility], accessed from [facility], even though the hospital's physical address listed is [address].    If scheduled at [facility] or [facility], please arrive 15 mins early for check-in and test prep.  There is spacious parking and easy access to the radiology department from the [facility] Vascular entrance. Please enter here and check-in with the desk attendant.   Please follow these instructions carefully (unless otherwise directed):  An IV will be required for this test and Nitroglycerin will be given.  Hold all erectile dysfunction medications at least 3 days (72 hrs) prior to test. (Ie viagra, cialis, sildenafil, tadalafil, etc)   On the Night Before the Test: Be sure to Drink plenty of water. Do not consume any caffeinated/decaffeinated beverages or chocolate 12 hours prior to your test. Do not take any antihistamines 12 hours prior to your test. If the patient has contrast allergy: Patient will need a prescription for Prednisone  and very clear instructions (as follows): Prednisone  50 mg - take 13 hours prior to test Take another Prednisone  50 mg  7 hours prior to test Take another Prednisone  50 mg 1 hour prior to test Take Benadryl  50 mg 1 hour prior to test Patient must complete all four doses of above prophylactic medications. Patient will need a ride after test due to Benadryl .  On the  Day of the Test: Drink plenty of water until 1 hour prior to the test. Do not eat any food 1 hour prior to test. You may take your regular medications prior to the test.  Take metoprolol  (Lopressor ) two hours prior to test. If you take Furosemide/Hydrochlorothiazide /Spironolactone, please HOLD on the morning of the test. FEMALES- please wear underwire-free bra if available, avoid dresses & tight clothing      After the Test: Drink plenty of water. After receiving IV contrast, you may experience a mild flushed feeling. This is normal. On occasion, you may experience a mild rash up to 24 hours after the test. This is not dangerous. If this occurs, you can take Benadryl  25 mg and increase your fluid intake. If you experience trouble breathing, this can be serious. If it is severe call 911 IMMEDIATELY. If it is mild, please call our office. If you take any of these medications: Glipizide/Metformin , Avandament, Glucavance, please do not take 48 hours after completing test unless otherwise instructed.  We will call to schedule your test 2-4 weeks out understanding that some insurance companies will need an authorization prior to the service being performed.   For more information and frequently asked questions, please visit our website : [email or web address]  For non-scheduling related questions, please contact the cardiac imaging nurse navigator should you have any questions/concerns: Cardiac Imaging Nurse Navigators Direct Office Dial: [phone]   For scheduling needs, including cancellations and rescheduling, please call Brittany, [phone].   Follow-Up: At [facility], you and your health needs are our priority.  As part of our continuing mission to provide you with exceptional heart care, our providers are all part of one team.  This team includes your primary Cardiologist (physician) and Advanced Practice Providers or APPs (Physician Assistants and Nurse  Practitioners) who all work together to provide you with the care you need, when you need it.  Your next appointment:   4 month(s)  Provider:   Maude Emmer, MD     Other Instructions None

## 2024-02-09 ENCOUNTER — Ambulatory Visit: Payer: Self-pay | Admitting: Cardiology

## 2024-02-09 ENCOUNTER — Other Ambulatory Visit: Payer: Self-pay | Admitting: Cardiology

## 2024-02-09 LAB — BASIC METABOLIC PANEL WITH GFR
BUN/Creatinine Ratio: 12 (ref 12–28)
BUN: 10 mg/dL (ref 8–27)
CO2: 21 mmol/L (ref 20–29)
Calcium: 9.9 mg/dL (ref 8.7–10.3)
Chloride: 95 mmol/L — ABNORMAL LOW (ref 96–106)
Creatinine, Ser: 0.84 mg/dL (ref 0.57–1.00)
Glucose: 94 mg/dL (ref 70–99)
Potassium: 3.7 mmol/L (ref 3.5–5.2)
Sodium: 136 mmol/L (ref 134–144)
eGFR: 76 mL/min/{1.73_m2}

## 2024-02-09 NOTE — Transitions of Care (Post Inpatient/ED Visit) (Signed)
 "  02/09/2024  Name: Janice Massey MRN: [id number] DOB: 1955/02/05  Today's TOC FU Call Status: Today's TOC FU Call Status:: Successful TOC FU Call Completed Unsuccessful Call (1st Attempt) Date: 02/07/24 Unsuccessful Call (2nd Attempt) Date: 02/08/24 [facility] FU Call Complete Date: 02/09/24  Patient's Name and Date of Birth confirmed. Name, DOB  Transition Care Management Follow-up Telephone Call Date of Discharge: 02/06/24 Discharge Facility: Janice Massey [facility]) Type of Discharge: Inpatient Admission Primary Inpatient Discharge Diagnosis:: chest pain How have you been since you were released from the hospital?: Better Any questions or concerns?: No  Items Reviewed: Did you receive and understand the discharge instructions provided?: Yes Medications obtained,verified, and reconciled?: Yes (Medications Reviewed) Any new allergies since your discharge?: No Dietary orders reviewed?: Yes Do you have support at home?: No  Medications Reviewed Today: Medications Reviewed Today     Reviewed by Emmitt Pan, LPN (Licensed Practical Nurse) on 02/09/24 at 1230  Med List Status: <None>   Medication Order Taking? Sig Documenting Provider Last Dose Status Informant  Accu-Chek FastClix Lancets MISC [id number] Yes 1 Units by Does not apply route daily. Use once a daily in morning to check blood glucose Lemon Raisin, MD  Active Self, Pharmacy Records  atorvastatin  (LIPITOR) 40 MG tablet [id number] Yes Take 1 tablet (40 mg total) by mouth at bedtime. Shawn Sick, MD  Active Self, Pharmacy Records  bisacodyl  (DULCOLAX) 10 MG suppository [id number] Yes Place 1 suppository (10 mg total) rectally as needed for moderate constipation. Lovie Clarity, MD  Active Self, Pharmacy Records  Blood Glucose Monitoring Suppl (ACCU-CHEK GUIDE) w/Device KIT [id number] Yes 1 Units by Does not apply route daily. Lemon Raisin, MD  Active Self, Pharmacy Records  cetirizine  (ZYRTEC ) 10 MG tablet [id number] Yes Take 1  tablet (10 mg total) by mouth daily. Shawn Sick, MD  Active Self, Pharmacy Records  Cholecalciferol (VITAMIN D3) 10 MCG (400 UNIT) tablet [id number] Yes Take 2 tablets (800 Units total) by mouth daily. Shawn Sick, MD  Active Self, Pharmacy Records  Empagliflozin -metFORMIN  HCl (SYNJARDY ) 05-998 MG TABS [id number] Yes Take 1 tablet by mouth in the morning and at bedtime. Lovie Clarity, MD  Active Self, Pharmacy Records  glucose blood (ACCU-CHEK GUIDE) test strip [id number] Yes Use as instructed Lemon Raisin, MD  Active Self, Pharmacy Records  losartan -hydrochlorothiazide  [facility]) 100-25 MG tablet [id number] Yes Take 1 tablet by mouth daily. Shawn Sick, MD  Active Self, Pharmacy Records  metoprolol  tartrate (LOPRESSOR ) 100 MG tablet [id number] Yes Take 1 tablet by mouth 2 hours before test. Vicci Rollo SAUNDERS, PA-C  Active   omeprazole  (PRILOSEC) 40 MG capsule [id number] Yes Take 1 capsule (40 mg total) by mouth daily. Shawn Sick, MD  Active Self, Pharmacy Records  pioglitazone  (ACTOS ) 15 MG tablet [id number] Yes Take 15 mg by mouth daily. [provider]  Active Self, Pharmacy Records  polyethylene glycol (MIRALAX ) 17 g packet [id number] Yes Take 17 g by mouth 2 (two) times daily. Lovie Clarity, MD  Active Self, Pharmacy Records  potassium chloride  SA (KLOR-CON  M) 20 MEQ tablet [id number] Yes Take 1 tablet (20 mEq total) by mouth 2 (two) times daily. Shawn Sick, MD  Active   prochlorperazine  (COMPAZINE ) 10 MG tablet [id number] Yes Take 1 tablet (10 mg total) by mouth every 6 (six) hours as needed for nausea or vomiting. Raford Lenis, MD  Active Self, Pharmacy Records  Semaglutide  (RYBELSUS ) 14 MG TABS [id number] Yes Take 1 tablet (14 mg total) by mouth daily. Shawn Sick, MD  Active Self,  Pharmacy Records  senna-docusate (SENOKOT-S) 8.6-50 MG tablet [id number] Yes Take 2 tablets by mouth 2 (two) times daily. Lovie Clarity, MD  Active Self, Pharmacy Records            Home Care and  Equipment/Supplies: Were Home Health Services Ordered?: NA Any new equipment or medical supplies ordered?: NA  Functional Questionnaire: Do you need assistance with meal preparation?: No Do you need assistance with eating?: No Do you have difficulty maintaining continence: No Do you need assistance with getting out of bed/getting out of a chair/moving?: No Do you have difficulty managing or taking your medications?: No  Follow up appointments reviewed: PCP Follow-up appointment confirmed?: Yes Date of PCP follow-up appointment?: 02/15/24 Follow-up Provider: [facility] Follow-up appointment confirmed?: NA Do you need transportation to your follow-up appointment?: No Do you understand care options if your condition(s) worsen?: Yes-patient verbalized understanding    SIGNATURE Julian Lemmings, LPN [facility] Nurse Health Advisor Direct Dial [phone]  "

## 2024-02-10 ENCOUNTER — Encounter: Payer: Self-pay | Admitting: Internal Medicine

## 2024-02-10 ENCOUNTER — Ambulatory Visit

## 2024-02-10 ENCOUNTER — Other Ambulatory Visit: Payer: Self-pay | Admitting: Cardiology

## 2024-02-10 VITALS — Ht 64.0 in | Wt 174.0 lb

## 2024-02-10 DIAGNOSIS — Z1211 Encounter for screening for malignant neoplasm of colon: Secondary | ICD-10-CM

## 2024-02-10 NOTE — Progress Notes (Signed)
 PCP MD at time of PV: [address] [facility]  __________________________________________________________________________________________________________________________________________  No egg allergy known to patient  No soy allergy known to patient No issues known to pt with past sedation with any surgeries or procedures Patient denies ever being told they had issues or difficulty with intubation  No FH of Malignant Hyperthermia Pt is not on diet pills Pt is not on  home 02  Pt is not on blood thinners  No A fib or A flutter Have any cardiac testing pending--no  LOA: independent  No Chew or Snuff tobacco __________________________________________________________________________________________________________________________________________  Constipation: no  Prep:  2 day suprep  __________________________________________________________________________________________________________________________________________  PV completed with patient. Prep instructions reviewed and provided during apt. Rx sent to preferred pharmacy.  __________________________________________________________________________________________________________________________________________  Patient's chart reviewed by Norleen Schillings CNRA prior to previsit and patient appropriate for the [facility].  Previsit completed and red dot placed by patient's name on their procedure day (on provider's schedule).

## 2024-02-15 ENCOUNTER — Ambulatory Visit: Payer: Self-pay

## 2024-02-24 ENCOUNTER — Encounter: Admitting: Internal Medicine

## 2024-02-29 ENCOUNTER — Ambulatory Visit

## 2024-03-07 ENCOUNTER — Other Ambulatory Visit (HOSPITAL_COMMUNITY)

## 2024-03-21 ENCOUNTER — Ambulatory Visit (HOSPITAL_COMMUNITY)

## 2024-04-11 ENCOUNTER — Ambulatory Visit: Admitting: Podiatry

## 2024-06-06 ENCOUNTER — Ambulatory Visit: Admitting: Cardiovascular Disease
